# Patient Record
Sex: Male | Born: 1955 | Race: White | Hispanic: No | Marital: Single | Smoking: Former smoker
Health system: Southern US, Community
[De-identification: ages and names within clinical notes are randomized; demographics above are authoritative.]

## PROBLEM LIST (undated history)

## (undated) DIAGNOSIS — M869 Osteomyelitis, unspecified: Secondary | ICD-10-CM

## (undated) DIAGNOSIS — I639 Cerebral infarction, unspecified: Secondary | ICD-10-CM

## (undated) DIAGNOSIS — E11628 Type 2 diabetes mellitus with other skin complications: Secondary | ICD-10-CM

## (undated) DIAGNOSIS — E872 Acidosis, unspecified: Secondary | ICD-10-CM

## (undated) DIAGNOSIS — F32A Depression, unspecified: Secondary | ICD-10-CM

## (undated) DIAGNOSIS — E785 Hyperlipidemia, unspecified: Secondary | ICD-10-CM

## (undated) DIAGNOSIS — E871 Hypo-osmolality and hyponatremia: Secondary | ICD-10-CM

## (undated) DIAGNOSIS — E1129 Type 2 diabetes mellitus with other diabetic kidney complication: Secondary | ICD-10-CM

## (undated) DIAGNOSIS — M109 Gout, unspecified: Secondary | ICD-10-CM

## (undated) DIAGNOSIS — I509 Heart failure, unspecified: Secondary | ICD-10-CM

## (undated) DIAGNOSIS — L089 Local infection of the skin and subcutaneous tissue, unspecified: Secondary | ICD-10-CM

## (undated) DIAGNOSIS — N1831 Chronic kidney disease, stage 3a: Secondary | ICD-10-CM

## (undated) DIAGNOSIS — E119 Type 2 diabetes mellitus without complications: Secondary | ICD-10-CM

## (undated) DIAGNOSIS — I5032 Chronic diastolic (congestive) heart failure: Secondary | ICD-10-CM

## (undated) DIAGNOSIS — I69354 Hemiplegia and hemiparesis following cerebral infarction affecting left non-dominant side: Secondary | ICD-10-CM

## (undated) DIAGNOSIS — M86172 Other acute osteomyelitis, left ankle and foot: Secondary | ICD-10-CM

## (undated) DIAGNOSIS — N179 Acute kidney failure, unspecified: Secondary | ICD-10-CM

## (undated) DIAGNOSIS — R471 Dysarthria and anarthria: Secondary | ICD-10-CM

## (undated) DIAGNOSIS — M86472 Chronic osteomyelitis with draining sinus, left ankle and foot: Secondary | ICD-10-CM

## (undated) DIAGNOSIS — L03032 Cellulitis of left toe: Secondary | ICD-10-CM

## (undated) HISTORY — PX: WISDOM TOOTH EXTRACTION: SHX21

## (undated) HISTORY — PX: COLONOSCOPY: SHX174

## (undated) HISTORY — PX: LAPAROSCOPIC GASTRIC BANDING: SHX1100

## (undated) HISTORY — DX: Local infection of the skin and subcutaneous tissue, unspecified: E11.628

---
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
## 2021-10-28 DIAGNOSIS — M1A9XX0 Chronic gout, unspecified, without tophus (tophi): Secondary | ICD-10-CM | POA: Insufficient documentation

## 2021-10-28 DIAGNOSIS — G2581 Restless legs syndrome: Secondary | ICD-10-CM | POA: Insufficient documentation

## 2021-11-10 ENCOUNTER — Other Ambulatory Visit: Payer: Self-pay

## 2021-11-10 ENCOUNTER — Encounter: Payer: Medicare Other | Attending: Internal Medicine | Admitting: Internal Medicine

## 2021-11-10 DIAGNOSIS — E11621 Type 2 diabetes mellitus with foot ulcer: Secondary | ICD-10-CM | POA: Insufficient documentation

## 2021-11-10 DIAGNOSIS — L97512 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ed: Secondary | ICD-10-CM | POA: Diagnosis not present

## 2021-11-10 DIAGNOSIS — E1140 Type 2 diabetes mellitus with diabetic neuropathy, unspecified: Secondary | ICD-10-CM

## 2021-11-10 DIAGNOSIS — E1151 Type 2 diabetes mellitus with diabetic peripheral angiopathy without gangrene: Secondary | ICD-10-CM | POA: Diagnosis not present

## 2021-11-10 DIAGNOSIS — R2689 Other abnormalities of gait and mobility: Secondary | ICD-10-CM | POA: Diagnosis not present

## 2021-11-10 DIAGNOSIS — E1142 Type 2 diabetes mellitus with diabetic polyneuropathy: Secondary | ICD-10-CM | POA: Diagnosis not present

## 2021-11-10 DIAGNOSIS — M1990 Unspecified osteoarthritis, unspecified site: Secondary | ICD-10-CM | POA: Insufficient documentation

## 2021-11-10 NOTE — Progress Notes (Signed)
[name], [name] ([id number]) Visit Report for 11/10/2021 Allergy List Details Patient Name: [name], [name] Date of Service: 11/10/2021 10:00 AM Medical Record Number: [id number] Patient Account Number: [id number] Date of Birth/Sex: 1956/02/20 (66 y.o. M) Treating RN: Donnamarie Poag Primary Care Kiyah Demartini: [name] Other Clinician: Referring Shaneil Yazdi: [name] Treating Tyr Franca/Extender: Yaakov Guthrie in Treatment: 0 Allergies Active Allergies No Known Allergies Allergy Notes Electronic Signature(s) Signed: 11/10/2021 4:15:42 PM By: Donnamarie Poag Entered By: Donnamarie Poag on 11/10/2021 10:17:20 [name] ([id number]) -------------------------------------------------------------------------------- Arrival Information Details Patient Name: [name] Date of Service: 11/10/2021 10:00 AM Medical Record Number: [id number] Patient Account Number: [id number] Date of Birth/Sex: November 17, 1955 (66 y.o. M) Treating RN: Donnamarie Poag Primary Care Janyla Biscoe: [name] Other Clinician: Referring Saxon Barich: [name] Treating Ying [place]ocks/Extender: Yaakov Guthrie in Treatment: 0 Visit Information Patient Arrived: Ambulatory Arrival Time: 10:14 Accompanied By: self Transfer Assistance: None Patient Identification Verified: Yes Secondary Verification Process Completed: Yes Patient Requires Transmission-Based Precautions: No Patient Has Alerts: Yes Patient Alerts: DIABETIC Electronic Signature(s) Signed: 11/10/2021 4:15:42 PM By: Donnamarie Poag Entered By: Donnamarie Poag on 11/10/2021 10:15:49 [name] ([id number]) -------------------------------------------------------------------------------- Clinic Level of Care Assessment Details Patient Name: [name] Date of Service: 11/10/2021 10:00 AM Medical Record Number: [id number] Patient Account Number: [id number] Date of Birth/Sex: Oct 06, 1956 (66 y.o. M) Treating RN: Donnamarie Poag Primary Care Jamell Laymon: [name] Other Clinician: Referring Alka Falwell: [name] Treating Abbegail Matuska/Extender: Yaakov Guthrie in Treatment: 0 Clinic Level of Care Assessment Items TOOL 2 Quantity Score [] - Use when only an EandM is performed on the INITIAL visit 0 ASSESSMENTS - Nursing Assessment / Reassessment [] - General Physical Exam (combine w/ comprehensive assessment (listed just below) when performed on new 0 pt. evals) [] - 0 Comprehensive Assessment (HX, ROS, Risk Assessments, Wounds Hx, etc.) ASSESSMENTS - Wound and Skin Assessment / Reassessment X - Simple Wound Assessment / Reassessment - one wound 1 5 [] - 0 Complex Wound Assessment / Reassessment - multiple wounds [] - 0 Dermatologic / Skin Assessment (not related to wound area) ASSESSMENTS - Ostomy and/or Continence Assessment and Care [] - Incontinence Assessment and Management 0 [] - 0 Ostomy Care Assessment and Management (repouching, etc.) PROCESS - Coordination of Care X - Simple Patient / Family Education for ongoing care 1 15 [] - 0 Complex (extensive) Patient / Family Education for ongoing care X- 1 10 Staff obtains Programmer, systems, Records, Test Results / Process Orders [] - 0 Staff telephones HHA, Nursing Homes / Clarify orders / etc [] - 0 Routine Transfer to another Facility (non-emergent condition) [] - 0 Routine Hospital Admission (non-emergent condition) X- 1 15 New Admissions / Biomedical engineer / Ordering NPWT, Apligraf, etc. [] - 0 Emergency Hospital Admission (emergent condition) X- 1 10 Simple Discharge Coordination [] - 0 Complex (extensive) Discharge Coordination PROCESS - Special Needs [] - Pediatric / Minor Patient Management 0 [] - 0 Isolation Patient Management [] - 0 Hearing / Language / Visual special needs [] - 0 Assessment of Community assistance (transportation, D/C planning, etc.) [] - 0 Additional assistance / Altered mentation [] - 0 Support Surface(s) Assessment (bed, cushion,  seat, etc.) INTERVENTIONS - Wound Cleansing / Measurement X - Wound Imaging (photographs - any number of wounds) 1 5 [] - 0 Wound Tracing (instead of photographs) X- 1 5 Simple Wound Measurement - one wound [] - 0 Complex Wound Measurement - multiple wounds [name], [name] ([id number]) X- 1 5 Simple Wound Cleansing - one wound [] - 0 Complex  Wound Cleansing - multiple wounds INTERVENTIONS - Wound Dressings X - Small Wound Dressing one or multiple wounds 1 10 [] - 0 Medium Wound Dressing one or multiple wounds [] - 0 Large Wound Dressing one or multiple wounds [] - 0 Application of Medications - injection INTERVENTIONS - Miscellaneous [] - External ear exam 0 [] - 0 Specimen Collection (cultures, biopsies, blood, body fluids, etc.) [] - 0 Specimen(s) / Culture(s) sent or taken to Lab for analysis [] - 0 Patient Transfer (multiple staff / Civil Service fast streamer / Similar devices) [] - 0 Simple Staple / Suture removal (25 or less) [] - 0 Complex Staple / Suture removal (26 or more) [] - 0 Hypo / Hyperglycemic Management (close monitor of Blood Glucose) X- 1 15 Ankle / Brachial Index (ABI) - do not check if billed separately Has the patient been seen at the hospital within the last three years: Yes Total Score: 95 Level Of Care: New/Established - Level 3 Electronic Signature(s) Signed: 11/10/2021 4:15:42 PM By: Donnamarie Poag Entered By: Donnamarie Poag on 11/10/2021 10:54:51 [name] ([id number]) -------------------------------------------------------------------------------- Encounter Discharge Information Details Patient Name: [name] Date of Service: 11/10/2021 10:00 AM Medical Record Number: [id number] Patient Account Number: [id number] Date of Birth/Sex: [birth year] (66 y.o. M) Treating RN: Donnamarie Poag Primary Care Maura Braaten: [name] Other Clinician: Referring Eivan Gallina: [name] Treating Girolamo Lortie/Extender: Yaakov Guthrie in Treatment: 0 Encounter  Discharge Information Items Post Procedure Vitals Discharge Condition: Stable Temperature (F): 97.9 Ambulatory Status: Ambulatory Pulse (bpm): 102 Discharge Destination: Home Respiratory Rate (breaths/min): 16 Transportation: Private Auto Blood Pressure (mmHg): 114/74 Accompanied By: self Schedule Follow-up Appointment: No Clinical Summary of Care: Electronic Signature(s) Signed: 11/10/2021 4:15:42 PM By: Donnamarie Poag Entered By: Donnamarie Poag on 11/10/2021 11:12:56 [name] ([id number]) -------------------------------------------------------------------------------- Lower Extremity Assessment Details Patient Name: [name] Date of Service: 11/10/2021 10:00 AM Medical Record Number: [id number] Patient Account Number: [id number] Date of Birth/Sex: 15-Dec-1955 (65 y.o. M) Treating RN: Donnamarie Poag Primary Care Carie Kapuscinski: [name] Other Clinician: Referring Andras Grunewald: [name] Treating Linley Moskal/Extender: Yaakov Guthrie in Treatment: 0 Edema Assessment Assessed: [Left: No] [Right: Yes] Edema: [Left: N] [Right: o] Calf Left: Right: Point of Measurement: 36 cm From Medial Instep 38 cm Ankle Left: Right: Point of Measurement: 10 cm From Medial Instep 24 cm Knee To Floor Left: Right: From Medial Instep 47 cm Vascular Assessment Pulses: Dorsalis Pedis Palpable: [Right:Yes] Blood Pressure: Brachial: [Right:88] Ankle: [Right:Dorsalis Pedis: 84 0.95] Electronic Signature(s) Signed: 11/10/2021 4:15:42 PM By: Donnamarie Poag Entered By: Donnamarie Poag on 11/10/2021 10:37:54 [name] ([id number]) -------------------------------------------------------------------------------- Multi Wound Chart Details Patient Name: [name] Date of Service: 11/10/2021 10:00 AM Medical Record Number: [id number] Patient Account Number: [id number] Date of Birth/Sex: February 10, 1956 (65 y.o. M) Treating RN: Donnamarie Poag Primary Care Chaka Jefferys: [name] Other  Clinician: Referring Gailyn Crook: [name] Treating Jvon Meroney/Extender: Yaakov Guthrie in Treatment: 0 Vital Signs Height(in): 74 Pulse(bpm): 102 Weight(lbs): 226 Blood Pressure(mmHg): 114/74 Body Mass Index(BMI): 29 Temperature(F): 97.9 Respiratory Rate(breaths/min): 16 Photos: [N/A:N/A] Wound Location: Right, Plantar Foot N/A N/A Wounding Event: Blister N/A N/A Primary Etiology: Diabetic Wound/Ulcer of the Lower N/A N/A Extremity Comorbid History: Type II Diabetes, Osteoarthritis N/A N/A Date Acquired: 07/15/2020 N/A N/A Weeks of Treatment: 0 N/A N/A Wound Status: Open N/A N/A Measurements L x W x D (cm) 1.3x1.1x0.6 N/A N/A Area (cm) : 1.123 N/A N/A Volume (cm) : 0.674 N/A N/A Classification: Grade 2 N/A N/A Exudate Amount: Medium N/A N/A Exudate Type: Serosanguineous N/A N/A Exudate Color:  red, brown N/A N/A Wound Margin: Thickened N/A N/A Granulation Amount: Large (67-100%) N/A N/A Granulation Quality: Red, Pink N/A N/A Necrotic Amount: Small (1-33%) N/A N/A Exposed Structures: Fat Layer (Subcutaneous Tissue): N/A N/A Yes Fascia: No Tendon: No Muscle: No Joint: No Bone: No Debridement: Chemical/Enzymatic/Mechanical N/A N/A Pre-procedure Verification/Time 10:45 N/A N/A Out Taken: Pain Control: Lidocaine N/A N/A Instrument: Other(saline gauze) N/A N/A Bleeding: None N/A N/A Debridement Treatment Procedure was tolerated well N/A N/A Response: Post Debridement 1.3x1.1x0.6 N/A N/A Measurements L x W x D (cm) Post Debridement Volume: 0.674 N/A N/A (cm) Procedures Performed: Debridement N/A N/A Treatment Notes [name], [name] ([id number]) Electronic Signature(s) Signed: 11/10/2021 11:16:48 AM By: Kalman Shan DO Entered By: Kalman Shan on 11/10/2021 11:08:04 [name] ([id number]) -------------------------------------------------------------------------------- Multi-Disciplinary Care Plan Details Patient Name: [name] Date of  Service: 11/10/2021 10:00 AM Medical Record Number: [id number] Patient Account Number: [id number] Date of Birth/Sex: October 08, 1956 (65 y.o. M) Treating RN: Donnamarie Poag Primary Care : [name] Other Clinician: Referring : [name] Treating /Extender: Yaakov Guthrie in Treatment: 0 Active Inactive Orientation to the Wound Care Program Nursing Diagnoses: Knowledge deficit related to the wound healing center program Goals: Patient/caregiver will verbalize understanding of the [facility] Program Date Initiated: 11/10/2021 Target Resolution Date: 11/19/2021 Goal Status: Active Interventions: Provide education on orientation to the wound center Notes: Wound/Skin Impairment Nursing Diagnoses: Impaired tissue integrity Knowledge deficit related to smoking impact on wound healing Knowledge deficit related to ulceration/compromised skin integrity Goals: Patient/caregiver will verbalize understanding of skin care regimen Date Initiated: 11/10/2021 Target Resolution Date: 11/17/2021 Goal Status: Active Ulcer/skin breakdown will have a volume reduction of 30% by week 4 Date Initiated: 11/10/2021 Target Resolution Date: 12/08/2021 Goal Status: Active Ulcer/skin breakdown will have a volume reduction of 50% by week 8 Date Initiated: 11/10/2021 Target Resolution Date: 01/05/2022 Goal Status: Active Ulcer/skin breakdown will have a volume reduction of 80% by week 12 Date Initiated: 11/10/2021 Target Resolution Date: 02/02/2022 Goal Status: Active Ulcer/skin breakdown will heal within 14 weeks Date Initiated: 11/10/2021 Target Resolution Date: 02/16/2022 Goal Status: Active Interventions: Assess patient/caregiver ability to obtain necessary supplies Assess patient/caregiver ability to perform ulcer/skin care regimen upon admission and as needed Assess ulceration(s) every visit Notes: Electronic Signature(s) Signed: 11/10/2021 4:15:42 PM By: Donnamarie Poag Entered By: Donnamarie Poag on 11/10/2021 10:41:38 [name] ([id number]) -------------------------------------------------------------------------------- Pain Assessment Details Patient Name: [name] Date of Service: 11/10/2021 10:00 AM Medical Record Number: [id number] Patient Account Number: [id number] Date of Birth/Sex: [birth year] (65 y.o. M) Treating RN: Donnamarie Poag Primary Care : [name] Other Clinician: Referring : [name] Treating /Extender: Yaakov Guthrie in Treatment: 0 Active Problems Location of Pain Severity and Description of Pain Patient Has Paino Yes Site Locations Pain Location: Pain in Ulcers Rate the pain. Current Pain Level: 5 Pain Management and Medication Current Pain Management: Electronic Signature(s) Signed: 11/10/2021 4:15:42 PM By: Donnamarie Poag Entered By: Donnamarie Poag on 11/10/2021 10:15:57 [name] ([id number]) -------------------------------------------------------------------------------- Patient/Caregiver Education Details Patient Name: [name] Date of Service: 11/10/2021 10:00 AM Medical Record Number: [id number] Patient Account Number: [id number] Date of Birth/Gender: 1956/02/08 (65 y.o. M) Treating RN: Donnamarie Poag Primary Care Physician: [name] Other Clinician: Referring Physician: [name] Treating Physician/Extender: Yaakov Guthrie in Treatment: 0 Education Assessment Education Provided To: Patient Education Topics Provided Basic Hygiene: Elevated Blood Sugar/ Impact on Healing: Nutrition: Offloading: Pressure: Welcome To The [place]: Wound Debridement: Wound/Skin Impairment: Electronic Signature(s) Signed: 11/10/2021 4:15:42 PM By: Lyndel Safe,  Joy Entered ByDonnamarie Poag on 11/10/2021 10:55:29 [name] ([id number]) -------------------------------------------------------------------------------- Wound Assessment Details Patient  Name: [name], [name] Date of Service: 11/10/2021 10:00 AM Medical Record Number: [id number] Patient Account Number: [id number] Date of Birth/Sex: 14-Jun-1956 (65 y.o. M) Treating RN: Donnamarie Poag Primary Care : [name] Other Clinician: Referring : [name] Treating /Extender: Yaakov Guthrie in Treatment: 0 Wound Status Wound Number: 1 Primary Etiology: Diabetic Wound/Ulcer of the Lower Extremity Wound Location: Right, Plantar Foot Wound Status: Open Wounding Event: Blister Comorbid History: Type II Diabetes, Osteoarthritis Date Acquired: 07/15/2020 Weeks Of Treatment: 0 Clustered Wound: No Photos Wound Measurements Length: (cm) 1.3 Width: (cm) 1.1 Depth: (cm) 0.6 Area: (cm) 1.123 Volume: (cm) 0.674 % Reduction in Area: % Reduction in Volume: Tunneling: No Undermining: No Wound Description Classification: Grade 2 Wound Margin: Thickened Exudate Amount: Medium Exudate Type: Serosanguineous Exudate Color: red, brown Foul Odor After Cleansing: No Slough/Fibrino Yes Wound Bed Granulation Amount: Large (67-100%) Exposed Structure Granulation Quality: Red, Pink Fascia Exposed: No Necrotic Amount: Small (1-33%) Fat Layer (Subcutaneous Tissue) Exposed: Yes Necrotic Quality: Adherent Slough Tendon Exposed: No Muscle Exposed: No Joint Exposed: No Bone Exposed: No Treatment Notes Wound #1 (Foot) Wound Laterality: Plantar, Right Cleanser [place] Ancillary Kit - 15 Day Supply Discharge Instruction: Use supplies as instructed; Kit contains: (15) Saline Bullets; (15) 3x3 Gauze; 15 pr Gloves Soap and Water [name], [name] ([id number]) Discharge Instruction: Gently cleanse wound with antibacterial soap, rinse and pat dry prior to dressing wounds Wound Cleanser Discharge Instruction: Wash your hands with soap and water. Remove old dressing, discard into plastic bag and place into trash. Cleanse the wound with Wound Cleanser prior to  applying a clean dressing using gauze sponges, not tissues or cotton balls. Do not scrub or use excessive force. Pat dry using gauze sponges, not tissue or cotton balls. Peri-Wound Care Topical Gentamicin Discharge Instruction: IN THE OFFICE ONLY-Apply as directed by . Primary Dressing Hydrofera Blue Ready Transfer Foam, 2.5x2.5 (in/in) Discharge Instruction: CUT TO FIT THE WOUND BED-Apply Hydrofera Blue Ready to wound bed as directed Secondary Dressing ABD Pad 5x9 (in/in) Discharge Instruction: Cover with ABD pad/may cut in half Secured With 51M [place] Surgical Tape, 2x2 (in/yd) Compression Wrap Compression Stockings Add-Ons Electronic Signature(s) Signed: 11/10/2021 4:15:42 PM By: Donnamarie Poag Entered By: Donnamarie Poag on 11/10/2021 10:31:58 [name] ([id number]) -------------------------------------------------------------------------------- [place] Details Patient Name: [name] Date of Service: 11/10/2021 10:00 AM Medical Record Number: [id number] Patient Account Number: [id number] Date of Birth/Sex: 1955-12-25 (65 y.o. M) Treating RN: Donnamarie Poag Primary Care : [name] Other Clinician: Referring : [name] Treating /Extender: Yaakov Guthrie in Treatment: 0 Vital Signs Time Taken: 10:10 Temperature (F): 97.9 Height (in): 74 Pulse (bpm): 102 Source: Stated Respiratory Rate (breaths/min): 16 Weight (lbs): 226 Blood Pressure (mmHg): 114/74 Source: Measured Reference Range: 80 - 120 mg / dl Body Mass Index (BMI): 29 Electronic Signature(s) Signed: 11/10/2021 4:15:42 PM By: Donnamarie Poag Entered ByDonnamarie Poag on 11/10/2021 10:17:06

## 2021-11-10 NOTE — Progress Notes (Signed)
[name], [name] ([id number]) Visit Report for 11/10/2021 Chief Complaint Document Details Patient Name: [name], [name] Date of Service: 11/10/2021 10:00 AM Medical Record Number: [id number] Patient Account Number: [id number] Date of Birth/Sex: 08/17/1956 (66 y.o. M) Treating RN: Donnamarie Poag Primary Care Provider: [name] Other Clinician: Referring Provider: [name] Treating Provider/Extender: Yaakov Guthrie in Treatment: 0 Information Obtained from: Patient Chief Complaint Right plantar foot wound Electronic Signature(s) Signed: 11/10/2021 11:16:48 AM By: Kalman Shan DO Entered By: Kalman Shan on 11/10/2021 11:08:32 [name] ([id number]) -------------------------------------------------------------------------------- Debridement Details Patient Name: [name] Date of Service: 11/10/2021 10:00 AM Medical Record Number: [id number] Patient Account Number: [id number] Date of Birth/Sex: [birth year] (66 y.o. M) Treating RN: Donnamarie Poag Primary Care Provider: [name] Other Clinician: Referring Provider: [name] Treating Provider/Extender: Yaakov Guthrie in Treatment: 0 Debridement Performed for Wound #1 Right,Plantar Foot Assessment: Performed By: Physician Kalman Shan, MD Debridement Type: Chemical/Enzymatic/Mechanical Agent Used: Gauze and saline Severity of Tissue Pre Debridement: Fat layer exposed Level of Consciousness (Pre- Awake and Alert procedure): Pre-procedure Verification/Time Out Yes - 10:45 Taken: Start Time: 10:46 Pain Control: Lidocaine Instrument: Other : saline gauze Bleeding: None Response to Treatment: Procedure was tolerated well Level of Consciousness (Post- Awake and Alert procedure): Post Debridement Measurements of Total Wound Length: (cm) 1.3 Width: (cm) 1.1 Depth: (cm) 0.6 Volume: (cm) 0.674 Character of Wound/Ulcer Post Debridement: Improved Severity of Tissue Post Debridement:  Fat layer exposed Post Procedure Diagnosis Same as Pre-procedure Electronic Signature(s) Signed: 11/10/2021 11:16:48 AM By: Kalman Shan DO Signed: 11/10/2021 4:15:42 PM By: Donnamarie Poag Entered By: Donnamarie Poag on 11/10/2021 10:49:40 [name] ([id number]) -------------------------------------------------------------------------------- HPI Details Patient Name: [name] Date of Service: 11/10/2021 10:00 AM Medical Record Number: [id number] Patient Account Number: [id number] Date of Birth/Sex: 1956-10-17 (66 y.o. M) Treating RN: Donnamarie Poag Primary Care Provider: [name] Other Clinician: Referring Provider: [name] Treating Provider/Extender: Yaakov Guthrie in Treatment: 0 History of Present Illness HPI Description: Admission 11/10/2021 [name] is a 66 year old male with a past medical history of uncontrolled type 2 diabetes with last hemoglobin Y3F of 8.1 complicated by peripheral neuropathy that presents to the clinic for a 2-year history of nonhealing ulcer to the bottom of his right foot. He states this started out as a blister caused by a work boot. He reports receiving wound care when he resided in [place]. He is reestablishing his wound care in [facility] today. He is currently keeping the area clean and covered. He has insoles designed to help offload the wound bed. He currently denies signs of infection. Electronic Signature(s) Signed: 11/10/2021 11:16:48 AM By: Kalman Shan DO Entered By: Kalman Shan on 11/10/2021 11:15:48 [name] ([id number]) -------------------------------------------------------------------------------- Physical Exam Details Patient Name: [name] Date of Service: 11/10/2021 10:00 AM Medical Record Number: [id number] Patient Account Number: [id number] Date of Birth/Sex: 12-01-1955 (66 y.o. M) Treating RN: Donnamarie Poag Primary Care Provider: [name] Other  Clinician: Referring Provider: [name] Treating Provider/Extender: Yaakov Guthrie in Treatment: 0 Constitutional . Cardiovascular . Psychiatric . Notes Right foot: To the plantar aspect he has a punched out ulcer with increased depth in the center that does not probe to bone. Most of the tissues granulation tissue although does not appear healthy. No drainage noted. No signs of infection. Electronic Signature(s) Signed: 11/10/2021 11:16:48 AM By: Kalman Shan DO Entered By: Kalman Shan on 11/10/2021 11:15:56 [name] ([id number]) -------------------------------------------------------------------------------- Physician Orders Details Patient Name: [name] Date of Service: 11/10/2021 10:00 AM Medical  Record Number: [id number] Patient Account Number: [id number] Date of Birth/Sex: [birth year] (66 y.o. M) Treating RN: Donnamarie Poag Primary Care Provider: [name] Other Clinician: Referring Provider: [name] Treating Provider/Extender: Yaakov Guthrie in Treatment: 0 Verbal / Phone Orders: No Diagnosis Coding ICD-10 Coding Code Description L97.512 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ed E11.621 Type 2 diabetes mellitus with foot ulcer E11.40 Type 2 diabetes mellitus with diabetic neuropathy, unspecified R26.89 Other abnormalities of gait and mobility Follow-up Appointments o Return Appointment in 1 week. Bathing/ Shower/ Hygiene o May shower; gently cleanse wound with antibacterial soap, rinse and pat dry prior to dressing wounds - change dressing after shower or keep dressing dry; keep covered o No tub bath. Anesthetic (Use 'Patient Medications' Section for Anesthetic Order Entry) o Lidocaine applied to wound bed Edema Control - Lymphedema / Segmental Compressive Device / Other o Elevate legs to the level of the heart and pump ankles as often as possible o Elevate leg(s)  parallel to the floor when sitting. o DO YOUR BEST to sleep in the bed at night. DO NOT sleep in your recliner. Long hours of sitting in a recliner leads to swelling of the legs and/or potential wounds on your backside. Off-Loading o Foot Defender o - Right foot-MAKE SURE A SOCK OR STOCKINETTE COVERS YOUR SKIN AND NOT IN CONTACT WITH THE BOOT Additional Orders / Instructions o Follow Nutritious Diet and Increase Protein Intake Wound Treatment Wound #1 - Foot Wound Laterality: Plantar, Right Cleanser: Byram Ancillary Kit - 15 Day Supply (DME) (Generic) 1 x Per Day/15 Days Discharge Instructions: Use supplies as instructed; Kit contains: (15) Saline Bullets; (15) 3x3 Gauze; 15 pr Gloves Cleanser: Soap and Water 1 x Per Day/15 Days Discharge Instructions: Gently cleanse wound with antibacterial soap, rinse and pat dry prior to dressing wounds Cleanser: Wound Cleanser 1 x Per Day/15 Days Discharge Instructions: Wash your hands with soap and water. Remove old dressing, discard into plastic bag and place into trash. Cleanse the wound with Wound Cleanser prior to applying a clean dressing using gauze sponges, not tissues or cotton balls. Do not scrub or use excessive force. Pat dry using gauze sponges, not tissue or cotton balls. Topical: Gentamicin 1 x Per Day/15 Days Discharge Instructions: IN THE OFFICE ONLY-Apply as directed by provider. Primary Dressing: Hydrofera Blue Ready Transfer Foam, 2.5x2.5 (in/in) (Generic) 1 x Per Day/15 Days Discharge Instructions: CUT TO FIT THE WOUND BED-Apply Hydrofera Blue Ready to wound bed as directed Secondary Dressing: ABD Pad 5x9 (in/in) (Generic) 1 x Per Day/15 Days Discharge Instructions: Cover with ABD pad/may cut in half Secured With: 61M [place] Surgical Tape, 2x2 (in/yd) (Generic) 1 x Per Day/15 Days Demars, Wayman ([id number]) Electronic Signature(s) Signed: 11/10/2021 11:16:48 AM By: Kalman Shan DO Signed: 11/10/2021 4:15:42 PM  By: Donnamarie Poag Entered By: Donnamarie Poag on 11/10/2021 11:04:49 [name] ([id number]) -------------------------------------------------------------------------------- Problem List Details Patient Name: [name] Date of Service: 11/10/2021 10:00 AM Medical Record Number: [id number] Patient Account Number: [id number] Date of Birth/Sex: October 07, 1956 (66 y.o. M) Treating RN: Donnamarie Poag Primary Care Provider: [name] Other Clinician: Referring Provider: [name] Treating Provider/Extender: Yaakov Guthrie in Treatment: 0 Active Problems ICD-10 Encounter Code Description Active Date MDM Diagnosis L97.512 Non-pressure chronic ulcer of other part of right foot with fat layer 11/10/2021 No Yes exposed E11.621 Type 2 diabetes mellitus with foot ulcer 11/10/2021 No Yes E11.40 Type 2 diabetes mellitus with diabetic neuropathy, unspecified 11/10/2021  No Yes R26.89 Other abnormalities of gait and mobility 11/10/2021 No Yes Inactive Problems Resolved Problems Electronic Signature(s) Signed: 11/10/2021 11:16:48 AM By: Kalman Shan DO Entered By: Kalman Shan on 11/10/2021 11:07:56 [name] ([id number]) -------------------------------------------------------------------------------- Progress Note Details Patient Name: [name] Date of Service: 11/10/2021 10:00 AM Medical Record Number: [id number] Patient Account Number: [id number] Date of Birth/Sex: 05/01/1956 (66 y.o. M) Treating RN: Donnamarie Poag Primary Care Provider: [name] Other Clinician: Referring Provider: [name] Treating Provider/Extender: Yaakov Guthrie in Treatment: 0 Subjective Chief Complaint Information obtained from Patient Right plantar foot wound History of Present Illness (HPI) Admission 11/10/2021 Mr. Gustavo Dispenza is a 66 year old male with a past medical history of uncontrolled type 2 diabetes with last hemoglobin Z3A of 8.1 complicated by peripheral  neuropathy that presents to the clinic for a 2-year history of nonhealing ulcer to the bottom of his right foot. He states this started out as a blister caused by a work boot. He reports receiving wound care when he resided in [place]. He is reestablishing his wound care in [facility] today. He is currently keeping the area clean and covered. He has insoles designed to help offload the wound bed. He currently denies signs of infection. Patient History Information obtained from Patient. Allergies No Known Allergies Social History Former smoker - ended on 10/07/1997, Marital Status - Divorced, Alcohol Use - Never - stopped 4 yrs ago, Drug Use - No History, Caffeine Use - Daily. Medical History Endocrine Patient has history of Type II Diabetes Musculoskeletal Patient has history of Osteoarthritis Patient is treated with Controlled Diet, Oral Agents. Blood sugar is not tested. Review of Systems (ROS) Constitutional Symptoms (General Health) Denies complaints or symptoms of Fatigue, Fever, Chills, Marked Weight Change. Eyes Denies complaints or symptoms of Dry Eyes, Vision Changes, Glasses / Contacts. Ear/Nose/Mouth/Throat Denies complaints or symptoms of Difficult clearing ears, Sinusitis. Hematologic/Lymphatic Denies complaints or symptoms of Bleeding / Clotting Disorders, Human Immunodeficiency Virus. Respiratory Denies complaints or symptoms of Chronic or frequent coughs, Shortness of Breath. Cardiovascular Denies complaints or symptoms of Chest pain, LE edema. Gastrointestinal Denies complaints or symptoms of Frequent diarrhea, Nausea, Vomiting. Genitourinary stated taking Lisinopril for kidney protection withDM Immunological Denies complaints or symptoms of Hives, Itching. Integumentary (Skin) Denies complaints or symptoms of Wounds, Bleeding or bruising tendency, Breakdown, Swelling. Neurologic Denies complaints or symptoms of Numbness/parasthesias,  Focal/Weakness. Psychiatric Denies complaints or symptoms of Anxiety, Claustrophobia. IRAN, ROWE ([id number]) Objective Constitutional Vitals Time Taken: 10:10 AM, Height: 74 in, Source: Stated, Weight: 226 lbs, Source: Measured, BMI: 29, Temperature: 97.9 F, Pulse: 102 bpm, Respiratory Rate: 16 breaths/min, Blood Pressure: 114/74 mmHg. General Notes: Right foot: To the plantar aspect he has a punched out ulcer with increased depth in the center that does not probe to bone. Most of the tissues granulation tissue although does not appear healthy. No drainage noted. No signs of infection. Integumentary (Hair, Skin) Wound #1 status is Open. Original cause of wound was Blister. The date acquired was: 07/15/2020. The wound is located on the [place]. The wound measures 1.3cm length x 1.1cm width x 0.6cm depth; 1.123cm^2 area and 0.674cm^3 volume. There is Fat Layer (Subcutaneous Tissue) exposed. There is no tunneling or undermining noted. There is a medium amount of serosanguineous drainage noted. The wound margin is thickened. There is large (67-100%) red, pink granulation within the wound bed. There is a small (1-33%) amount of necrotic tissue within the wound bed including Adherent Slough. Assessment Active Problems ICD-10 Non-pressure chronic  ulcer of other part of right foot with fat layer exposed Type 2 diabetes mellitus with foot ulcer Type 2 diabetes mellitus with diabetic neuropathy, unspecified Other abnormalities of gait and mobility Patient presents with a 2-year history of nonhealing wound to the plantar aspect of the right foot. We discussed the importance of aggressive offloading, glycemic control and proper dressing in wound healing. I recommended the defender boot to help with offloading. His hemoglobin A1c is 8.1 and he is on oral medications. I recommended he continue to work on achieving better glycemic control. I recommended Hydrofera Blue daily for his  dressing change. He had no signs of infection on exam today. Follow-up in 1 week. 46 minutes was spent on the encounter including face-to-face, EMR review and coordination of care. Procedures Wound #1 Pre-procedure diagnosis of Wound #1 is a Diabetic Wound/Ulcer of the Lower Extremity located on the [place] .Severity of Tissue Pre Debridement is: Fat layer exposed. There was a Chemical/Enzymatic/Mechanical debridement performed by Kalman Shan, MD. With the following instrument(s): saline gauze after achieving pain control using Lidocaine. Other agent used was Gauze and saline. A time out was conducted at 10:45, prior to the start of the procedure. There was no bleeding. The procedure was tolerated well. Post Debridement Measurements: 1.3cm length x 1.1cm width x 0.6cm depth; 0.674cm^3 volume. Character of Wound/Ulcer Post Debridement is improved. Severity of Tissue Post Debridement is: Fat layer exposed. Post procedure Diagnosis Wound #1: Same as Pre-Procedure Plan Follow-up Appointments: Return Appointment in 1 week. Bathing/ Shower/ Hygiene: May shower; gently cleanse wound with antibacterial soap, rinse and pat dry prior to dressing wounds - change dressing after shower or keep dressing dry; keep covered No tub bath. Anesthetic (Use 'Patient Medications' Section for Anesthetic Order Entry): Lidocaine applied to wound bed Edema Control - Lymphedema / Segmental Compressive Device / Other: Watkinson, Khadar ([id number]) Elevate legs to the level of the heart and pump ankles as often as possible Elevate leg(s) parallel to the floor when sitting. DO YOUR BEST to sleep in the bed at night. DO NOT sleep in your recliner. Long hours of sitting in a recliner leads to swelling of the legs and/or potential wounds on your backside. Off-Loading: Foot Defender  - Right foot-MAKE SURE A SOCK OR STOCKINETTE COVERS YOUR SKIN AND NOT IN CONTACT WITH THE BOOT Additional Orders /  Instructions: Follow Nutritious Diet and Increase Protein Intake WOUND #1: - Foot Wound Laterality: Plantar, Right Cleanser: Byram Ancillary Kit - 15 Day Supply (DME) (Generic) 1 x Per Day/15 Days Discharge Instructions: Use supplies as instructed; Kit contains: (15) Saline Bullets; (15) 3x3 Gauze; 15 pr Gloves Cleanser: Soap and Water 1 x Per Day/15 Days Discharge Instructions: Gently cleanse wound with antibacterial soap, rinse and pat dry prior to dressing wounds Cleanser: Wound Cleanser 1 x Per Day/15 Days Discharge Instructions: Wash your hands with soap and water. Remove old dressing, discard into plastic bag and place into trash. Cleanse the wound with Wound Cleanser prior to applying a clean dressing using gauze sponges, not tissues or cotton balls. Do not scrub or use excessive force. Pat dry using gauze sponges, not tissue or cotton balls. Topical: Gentamicin 1 x Per Day/15 Days Discharge Instructions: IN THE OFFICE ONLY-Apply as directed by provider. Primary Dressing: Hydrofera Blue Ready Transfer Foam, 2.5x2.5 (in/in) (Generic) 1 x Per Day/15 Days Discharge Instructions: CUT TO FIT THE WOUND BED-Apply Hydrofera Blue Ready to wound bed as directed Secondary Dressing: ABD Pad 5x9 (in/in) (Generic) 1 x Per  Day/15 Days Discharge Instructions: Cover with ABD pad/may cut in half Secured With: [place] Surgical Tape, 2x2 (in/yd) (Generic) 1 x Per Day/15 Days 1. Hydrofera Blue 2. Aggressive offloading order defender boot 3. Follow-up in 1 week Electronic Signature(s) Signed: 11/10/2021 11:16:48 AM By: Kalman Shan DO Entered By: Kalman Shan on 11/10/2021 11:16:13 [name] ([id number]) -------------------------------------------------------------------------------- ROS/PFSH Details Patient Name: [name] Date of Service: 11/10/2021 10:00 AM Medical Record Number: [id number] Patient Account Number: [id number] Date of Birth/Sex: 10/26/1956 (65 y.o.  M) Treating RN: Donnamarie Poag Primary Care Provider: [name] Other Clinician: Referring Provider: [name] Treating Provider/Extender: Yaakov Guthrie in Treatment: 0 Information Obtained From Patient Constitutional Symptoms (General Health) Complaints and Symptoms: Negative for: Fatigue; Fever; Chills; Marked Weight Change Eyes Complaints and Symptoms: Negative for: Dry Eyes; Vision Changes; Glasses / Contacts Ear/Nose/Mouth/Throat Complaints and Symptoms: Negative for: Difficult clearing ears; Sinusitis Hematologic/Lymphatic Complaints and Symptoms: Negative for: Bleeding / Clotting Disorders; Human Immunodeficiency Virus Respiratory Complaints and Symptoms: Negative for: Chronic or frequent coughs; Shortness of Breath Cardiovascular Complaints and Symptoms: Negative for: Chest pain; LE edema Gastrointestinal Complaints and Symptoms: Negative for: Frequent diarrhea; Nausea; Vomiting Immunological Complaints and Symptoms: Negative for: Hives; Itching Integumentary (Skin) Complaints and Symptoms: Negative for: Wounds; Bleeding or bruising tendency; Breakdown; Swelling Neurologic Complaints and Symptoms: Negative for: Numbness/parasthesias; Focal/Weakness Psychiatric Complaints and Symptoms: Negative for: Anxiety; Claustrophobia [place], RUMPLE ([id number]) Endocrine Medical History: Positive for: Type II Diabetes Time with diabetes: 40 years Treated with: Oral agents, Diet Blood sugar tested every day: No Genitourinary Complaints and Symptoms: Review of System Notes: stated taking Lisinopril for kidney protection withDM Musculoskeletal Medical History: Positive for: Osteoarthritis Oncologic Immunizations Pneumococcal Vaccine: Received Pneumococcal Vaccination: Yes Received Pneumococcal Vaccination On or After 60th Birthday: Yes Implantable Devices None Family and Social History Former smoker - ended on 10/07/1997; Marital Status -  Divorced; Alcohol Use: Never - stopped 4 yrs ago; Drug Use: No History; Caffeine Use: Daily Electronic Signature(s) Signed: 11/10/2021 11:16:48 AM By: Kalman Shan DO Signed: 11/10/2021 4:15:42 PM By: Donnamarie Poag Entered By: Donnamarie Poag on 11/10/2021 10:21:17 [name] ([id number]) -------------------------------------------------------------------------------- SuperBill Details Patient Name: [name] Date of Service: 11/10/2021 Medical Record Number: [id number] Patient Account Number: [id number] Date of Birth/Sex: July 17, 1956 (66 y.o. M) Treating RN: Donnamarie Poag Primary Care Provider: [name] Other Clinician: Referring Provider: [name] Treating Provider/Extender: Yaakov Guthrie in Treatment: 0 Diagnosis Coding ICD-10 Codes Code Description [phone] 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ed E11.621 Type 2 diabetes mellitus with foot ulcer E11.40 Type 2 diabetes mellitus with diabetic neuropathy, unspecified R26.89 Other abnormalities of gait and mobility Facility Procedures CPT4 Code: [id number] Description: WOUND CARE VISIT-LEV 3 NEW PT Modifier: Quantity: 1 Physician Procedures CPT4 Code: [id number] Description: 10211 - WC PHYS LEVEL 4 - NEW PT Modifier: Quantity: 1 CPT4 Code: Description: ICD-10 Diagnosis Description L97.512 Non-pressure chronic ulcer of other part of right foot with fat layer e E11.621 Type 2 diabetes mellitus with foot ulcer E11.40 Type 2 diabetes mellitus with diabetic neuropathy, unspecified R26.89 Other  abnormalities of gait and mobility Modifier: xposed Quantity: Electronic Signature(s) Signed: 11/10/2021 11:16:48 AM By: Kalman Shan DO Entered By: Kalman Shan on 11/10/2021 11:16:19

## 2021-11-10 NOTE — Progress Notes (Signed)
[name], [name] ([id number]) Visit Report for 11/10/2021 Abuse/Suicide Risk Screen Details Patient Name: [name], [name] Date of Service: 11/10/2021 10:00 AM Medical Record Number: [id number] Patient Account Number: [id number] Date of Birth/Sex: [birth year] (65 y.o. M) Treating RN: Hansel Feinstein Primary Care Nnamdi Dacus: Manson Allan Other Clinician: Referring Kasara Schomer: Manson Allan Treating Shanica Castellanos/Extender: Tilda Franco in Treatment: 0 Abuse/Suicide Risk Screen Items Answer ABUSE RISK SCREEN: Has anyone close to you tried to hurt or harm you recentlyo No Do you feel uncomfortable with anyone in your familyo No Has anyone forced you do things that you didnot want to doo No Electronic Signature(s) Signed: 11/10/2021 4:15:42 PM By: Hansel Feinstein Entered By: Hansel Feinstein on 11/10/2021 10:21:27 [name] ([id number]) -------------------------------------------------------------------------------- Activities of Daily Living Details Patient Name: [name] Date of Service: 11/10/2021 10:00 AM Medical Record Number: [id number] Patient Account Number: [id number] Date of Birth/Sex: December 29, 1955 (65 y.o. M) Treating RN: Hansel Feinstein Primary Care Meric Joye: Manson Allan Other Clinician: Referring Liberato Stansbery: Manson Allan Treating Kellyanne Ellwanger/Extender: Tilda Franco in Treatment: 0 Activities of Daily Living Items Answer Activities of Daily Living (Please select one for each item) Drive Automobile Completely Able Take Medications Completely Able Use Telephone Completely Able Care for Appearance Completely Able Use Toilet Completely Able Bath / Shower Completely Able Dress Self Completely Able Feed Self Completely Able Walk Completely Able Get In / Out Bed Completely Able Housework Completely Able Prepare Meals Completely Able Handle Money Completely Able Shop for Self Completely Able Electronic Signature(s) Signed: 11/10/2021 4:15:42 PM By: Hansel Feinstein Entered By:  Hansel Feinstein on 11/10/2021 10:21:50 [name] ([id number]) -------------------------------------------------------------------------------- Education Screening Details Patient Name: [name] Date of Service: 11/10/2021 10:00 AM Medical Record Number: [id number] Patient Account Number: [id number] Date of Birth/Sex: [birth year] (65 y.o. M) Treating RN: Hansel Feinstein Primary Care Laterrica Libman: Manson Allan Other Clinician: Referring Leeza Heiner: Manson Allan Treating Cayne Yom/Extender: Tilda Franco in Treatment: 0 Primary Learner Assessed: Patient Learning Preferences/Education Level/Primary Language Learning Preference: Explanation Highest Education Level: College or Above Preferred Language: English Cognitive Barrier Language Barrier: No Translator Needed: No Memory Deficit: No Emotional Barrier: No Cultural/Religious Beliefs Affecting Medical Care: No Physical Barrier Impaired Vision: No Impaired Hearing: Yes Decreased Hand dexterity: No Knowledge/Comprehension Knowledge Level: High Comprehension Level: High Ability to understand written instructions: High Ability to understand verbal instructions: High Motivation Anxiety Level: Calm Cooperation: Cooperative Education Importance: Acknowledges Need Interest in Health Problems: Asks Questions Perception: Coherent Willingness to Engage in Self-Management High Activities: Readiness to Engage in Self-Management High Activities: Electronic Signature(s) Signed: 11/10/2021 4:15:42 PM By: Hansel Feinstein Entered By: Hansel Feinstein on 11/10/2021 10:22:17 [name] ([id number]) -------------------------------------------------------------------------------- Fall Risk Assessment Details Patient Name: [name] Date of Service: 11/10/2021 10:00 AM Medical Record Number: [id number] Patient Account Number: [id number] Date of Birth/Sex: January 12, 1956 (65 y.o. M) Treating RN: Hansel Feinstein Primary Care Adis Sturgill: Manson Allan Other Clinician: Referring Thelma Lorenzetti: Manson Allan Treating Lorilynn Lehr/Extender: Tilda Franco in Treatment: 0 Fall Risk Assessment Items Have you had 2 or more falls in the last 12 monthso 0 No Have you had any fall that resulted in injury in the last 12 monthso 0 No FALLS RISK SCREEN History of falling - immediate or within 3 months 0 No Secondary diagnosis (Do you have 2 or more medical diagnoseso) 0 No Ambulatory aid None/bed rest/wheelchair/nurse 0 Yes Crutches/cane/walker 0 No Furniture 0 No Intravenous therapy Access/Saline/Heparin Lock 0 No Gait/Transferring Normal/ bed rest/ wheelchair 0 Yes Weak (short steps with or without shuffle, stooped but able to lift head while walking,  may 0 No seek support from furniture) Impaired (short steps with shuffle, may have difficulty arising from chair, head down, impaired 0 No balance) Mental Status Oriented to own ability 0 Yes Electronic Signature(s) Signed: 11/10/2021 4:15:42 PM By: Hansel Feinstein Entered By: Hansel Feinstein on 11/10/2021 10:22:35 [name] ([id number]) -------------------------------------------------------------------------------- Foot Assessment Details Patient Name: [name] Date of Service: 11/10/2021 10:00 AM Medical Record Number: [id number] Patient Account Number: [id number] Date of Birth/Sex: 12-14-1955 (65 y.o. M) Treating RN: Hansel Feinstein Primary Care Laloni Rowton: Manson Allan Other Clinician: Referring Jinx Gilden: Manson Allan Treating Humbert Morozov/Extender: Tilda Franco in Treatment: 0 Foot Assessment Items Site Locations + = Sensation present, - = Sensation absent, C = Callus, U = Ulcer R = Redness, W = Warmth, M = Maceration, PU = Pre-ulcerative lesion F = Fissure, S = Swelling, D = Dryness Assessment Right: Left: Other Deformity: No No Prior Foot Ulcer: No No Prior Amputation: No No Charcot Joint: No No Ambulatory Status: Ambulatory Without Help Gait:  Steady Electronic Signature(s) Signed: 11/10/2021 4:15:42 PM By: Hansel Feinstein Entered By: Hansel Feinstein on 11/10/2021 10:25:41 [name] ([id number]) -------------------------------------------------------------------------------- Nutrition Risk Screening Details Patient Name: [name] Date of Service: 11/10/2021 10:00 AM Medical Record Number: [id number] Patient Account Number: [id number] Date of Birth/Sex: 1956-03-20 (65 y.o. M) Treating RN: Hansel Feinstein Primary Care Josetta Wigal: Manson Allan Other Clinician: Referring Dailon Sheeran: Manson Allan Treating Briona Korpela/Extender: Tilda Franco in Treatment: 0 Height (in): 74 Weight (lbs): 226 Body Mass Index (BMI): 29 Nutrition Risk Screening Items Score Screening NUTRITION RISK SCREEN: I have an illness or condition that made me change the kind and/or amount of food I eat 0 No I eat fewer than two meals per day 0 No I eat few fruits and vegetables, or milk products 0 No I have three or more drinks of beer, liquor or wine almost every day 0 No I have tooth or mouth problems that make it hard for me to eat 0 No I don't always have enough money to buy the food I need 0 No I eat alone most of the time 1 Yes I take three or more different prescribed or over-the-counter drugs a day 0 No Without wanting to, I have lost or gained 10 pounds in the last six months 0 No I am not always physically able to shop, cook and/or feed myself 0 No Nutrition Protocols Good Risk Protocol 0 No interventions needed Moderate Risk Protocol High Risk Proctocol Risk Level: Good Risk Score: 1 Electronic Signature(s) Signed: 11/10/2021 4:15:42 PM By: Hansel Feinstein Entered ByHansel Feinstein on 11/10/2021 10:22:46

## 2021-11-17 ENCOUNTER — Encounter (HOSPITAL_BASED_OUTPATIENT_CLINIC_OR_DEPARTMENT_OTHER): Payer: Medicare Other | Admitting: Internal Medicine

## 2021-11-17 ENCOUNTER — Other Ambulatory Visit: Payer: Self-pay

## 2021-11-17 DIAGNOSIS — R2689 Other abnormalities of gait and mobility: Secondary | ICD-10-CM

## 2021-11-17 DIAGNOSIS — E1140 Type 2 diabetes mellitus with diabetic neuropathy, unspecified: Secondary | ICD-10-CM

## 2021-11-17 DIAGNOSIS — L97512 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ed: Secondary | ICD-10-CM

## 2021-11-17 DIAGNOSIS — E11621 Type 2 diabetes mellitus with foot ulcer: Secondary | ICD-10-CM

## 2021-11-17 NOTE — Progress Notes (Signed)
YIDA, HYAMS ([id number]) Visit Report for 11/17/2021 Arrival Information Details Patient Name: FORNEY, KLEINPETER Date of Service: 11/17/2021 9:45 AM Medical Record Number: [id number] Patient Account Number: [id number] Date of Birth/Sex: December 05, 1955 (66 y.o. M) Treating RN: Levora Dredge Primary Care Isobella Ascher: [name] Other Clinician: Referring Massimiliano Rohleder: [name] Treating Jynesis Nakamura/Extender: Yaakov Guthrie in Treatment: 1 Visit Information History Since Last Visit Added or deleted any medications: No Patient Arrived: Ambulatory Any new allergies or adverse reactions: No Arrival Time: 09:55 Had a fall or experienced change in No Accompanied By: self activities of daily living that may affect Transfer Assistance: None risk of falls: Patient Identification Verified: Yes Hospitalized since last visit: No Secondary Verification Process Completed: Yes Has Dressing in Place as Prescribed: No Patient Requires Transmission-Based Precautions: No Pain Present Now: Yes Patient Has Alerts: Yes Patient Alerts: DIABETIC Electronic Signature(s) Signed: 11/17/2021 4:56:07 PM By: Levora Dredge Entered By: Levora Dredge on 11/17/2021 09:56:27 [name] ([id number]) -------------------------------------------------------------------------------- Clinic Level of Care Assessment Details Patient Name: [name] Date of Service: 11/17/2021 9:45 AM Medical Record Number: [id number] Patient Account Number: [id number] Date of Birth/Sex: 04/03/1956 (66 y.o. M) Treating RN: Levora Dredge Primary Care Shuntae Herzig: [name] Other Clinician: Referring Exander Shaul: [name] Treating Hoy Fallert/Extender: Yaakov Guthrie in Treatment: 1 Clinic Level of Care Assessment Items TOOL 4 Quantity Score X - Use when only an EandM is performed on FOLLOW-UP visit 1 0 ASSESSMENTS - Nursing Assessment / Reassessment _0  - Reassessment of Co-morbidities (includes updates in  patient status) 0 _1  - 0 Reassessment of Adherence to Treatment Plan ASSESSMENTS - Wound and Skin Assessment / Reassessment X - Simple Wound Assessment / Reassessment - one wound 1 5 _2  - 0 Complex Wound Assessment / Reassessment - multiple wounds _3  - 0 Dermatologic / Skin Assessment (not related to wound area) ASSESSMENTS - Focused Assessment _4  - Circumferential Edema Measurements - multi extremities 0 _5  - 0 Nutritional Assessment / Counseling / Intervention _6  - 0 Lower Extremity Assessment (monofilament, tuning fork, pulses) _7  - 0 Peripheral Arterial Disease Assessment (using hand held doppler) ASSESSMENTS - Ostomy and/or Continence Assessment and Care _8  - Incontinence Assessment and Management 0 _9  - 0 Ostomy Care Assessment and Management (repouching, etc.) PROCESS - Coordination of Care X - Simple Patient / Family Education for ongoing care 1 15 _10  - 0 Complex (extensive) Patient / Family Education for ongoing care _11  - 0 Staff obtains Programmer, systems, Records, Test Results / Process Orders _12  - 0 Staff telephones HHA, Nursing Homes / Clarify orders / etc _13  - 0 Routine Transfer to another Facility (non-emergent condition) _14  - 0 Routine Hospital Admission (non-emergent condition) _15  - 0 New Admissions / Biomedical engineer / Ordering NPWT, Apligraf, etc. _16  - 0 Emergency Hospital Admission (emergent condition) X- 1 10 Simple Discharge Coordination _17  - 0 Complex (extensive) Discharge Coordination PROCESS - Special Needs _18  - Pediatric / Minor Patient Management 0 _19  - 0 Isolation Patient Management _20  - 0 Hearing / Language / Visual special needs _21  - 0 Assessment of Community assistance (transportation, D/C planning, etc.) _22  - 0 Additional assistance / Altered mentation _23  - 0 Support Surface(s) Assessment (bed, cushion, seat, etc.) INTERVENTIONS - Wound Cleansing / Measurement Ghattas, Kolsen ([id number]) X- 1 5 Simple Wound Cleansing - one wound _24   - 0 Complex Wound Cleansing - multiple wounds X- 1 5 Wound Imaging (photographs - any number of wounds) _25  - 0 Wound Tracing (instead of photographs) X- 1 5 Simple Wound Measurement - one wound _26  -  0 Complex Wound Measurement - multiple wounds INTERVENTIONS - Wound Dressings X - Small Wound Dressing one or multiple wounds 1 10 _0  - 0 Medium Wound Dressing one or multiple wounds _1  - 0 Large Wound Dressing one or multiple wounds <ZOXWRUEAVWUJWJXB>_1<\/YNWGNFAOZHYQMVHQ>_4  - 0 Application of Medications - topical <ONGEXBMWUXLKGMWN>_0<\/UVOZDGUYQIHKVQQV>_9  - 0 Application of Medications - injection INTERVENTIONS - Miscellaneous _4  - External ear exam 0 _5  - 0 Specimen Collection (cultures, biopsies, blood, body fluids, etc.) _6  - 0 Specimen(s) / Culture(s) sent or taken to Lab for analysis _7  - 0 Patient Transfer (multiple staff / Harrel Lemon Lift / Similar devices) _8  - 0 Simple Staple / Suture removal (25 or less) _9  - 0 Complex Staple / Suture removal (26 or more) _10  - 0 Hypo / Hyperglycemic Management (close monitor of Blood Glucose) _11  - 0 Ankle / Brachial Index (ABI) - do not check if billed separately X- 1 5 Vital Signs Has the patient been seen at the hospital within the last three years: Yes Total Score: 60 Level Of Care: New/Established - Level 2 Electronic Signature(s) Signed: 11/17/2021 4:56:07 PM By: Levora Dredge Entered By: Levora Dredge on 11/17/2021 10:24:19 [name] ([id number]) -------------------------------------------------------------------------------- Encounter Discharge Information Details Patient Name: [name] Date of Service: 11/17/2021 9:45 AM Medical Record Number: [id number] Patient Account Number: [id number] Date of Birth/Sex: 02/22/1956 (66 y.o. M) Treating RN: Levora Dredge Primary Care Shalondra Wunschel: [name] Other Clinician: Referring Johnte Portnoy: [name] Treating Neeva Trew/Extender: Yaakov Guthrie in Treatment: 1 Encounter Discharge Information Items Discharge Condition:  Stable Ambulatory Status: Ambulatory Discharge Destination: Home Transportation: Private Auto Accompanied By: self Schedule Follow-up Appointment: Yes Clinical Summary of Care: Electronic Signature(s) Signed: 11/17/2021 4:56:07 PM By: Levora Dredge Entered By: Levora Dredge on 11/17/2021 10:25:50 [name] ([id number]) -------------------------------------------------------------------------------- Lower Extremity Assessment Details Patient Name: [name] Date of Service: 11/17/2021 9:45 AM Medical Record Number: [id number] Patient Account Number: [id number] Date of Birth/Sex: 1956-02-19 (66 y.o. M) Treating RN: Levora Dredge Primary Care Dorrien Grunder: [name] Other Clinician: Referring Kelvon Giannini: [name] Treating Cornelia Walraven/Extender: Yaakov Guthrie in Treatment: 1 Edema Assessment Assessed: [Left: No] [Right: No] [Left: Edema] [Right: :] Calf Left: Right: Point of Measurement: 36 cm From Medial Instep 39 cm Ankle Left: Right: Point of Measurement: 10 cm From Medial Instep 24.5 cm Vascular Assessment Pulses: Dorsalis Pedis Palpable: [Right:Yes] Posterior Tibial Palpable: [Right:Yes] Electronic Signature(s) Signed: 11/17/2021 4:56:07 PM By: Levora Dredge Entered By: Levora Dredge on 11/17/2021 10:05:22 [name] ([id number]) -------------------------------------------------------------------------------- Multi Wound Chart Details Patient Name: [name] Date of Service: 11/17/2021 9:45 AM Medical Record Number: [id number] Patient Account Number: [id number] Date of Birth/Sex: 07-23-1956 (66 y.o. M) Treating RN: Levora Dredge Primary Care Priyal Musquiz: [name] Other Clinician: Referring Sarajane Fambrough: [name] Treating Aydden Cumpian/Extender: Yaakov Guthrie in Treatment: 1 Vital Signs Height(in): 74 Pulse(bpm): 79 Weight(lbs): 226 Blood Pressure(mmHg): 126/78 Body Mass Index(BMI): 29 Temperature(F):  97.9 Respiratory Rate(breaths/min): 18 Photos: [N/A:N/A] Wound Location: Right, Plantar Foot N/A N/A Wounding Event: Blister N/A N/A Primary Etiology: Diabetic Wound/Ulcer of the Lower N/A N/A Extremity Comorbid History: Type II Diabetes, Osteoarthritis N/A N/A Date Acquired: 07/15/2020 N/A N/A Weeks of Treatment: 1 N/A N/A Wound Status: Open N/A N/A Measurements L x W x D (cm) 1.5x1.2x0.3 N/A N/A Area (cm) : 1.414 N/A N/A Volume (cm) : 0.424 N/A N/A % Reduction in Area: -25.90% N/A N/A % Reduction in Volume: 37.10% N/A N/A Classification: Grade 2 N/A N/A Exudate Amount: Medium N/A N/A Exudate Type: Serosanguineous N/A N/A Exudate Color: red, brown N/A N/A  Wound Margin: Thickened N/A N/A Granulation Amount: Large (67-100%) N/A N/A Granulation Quality: Red, Pink N/A N/A Necrotic Amount: Small (1-33%) N/A N/A Exposed Structures: Fat Layer (Subcutaneous Tissue): N/A N/A Yes Fascia: No Tendon: No Muscle: No Joint: No Bone: No Epithelialization: None N/A N/A Treatment Notes Electronic Signature(s) Signed: 11/17/2021 4:56:07 PM By: Levora Dredge Entered By: Levora Dredge on 11/17/2021 10:23:29 [name] ([id number]) -------------------------------------------------------------------------------- Kamas Details Patient Name: [name] Date of Service: 11/17/2021 9:45 AM Medical Record Number: [id number] Patient Account Number: [id number] Date of Birth/Sex: 1956/09/14 (66 y.o. M) Treating RN: Levora Dredge Primary Care Worth Kober: [name] Other Clinician: Referring Shalev Helminiak: [name] Treating Penda Venturi/Extender: Yaakov Guthrie in Treatment: 1 Active Inactive Orientation to the Wound Care Program Nursing Diagnoses: Knowledge deficit related to the wound healing center program Goals: Patient/caregiver will verbalize understanding of the [facility] Program Date Initiated: 11/10/2021 Target Resolution Date:  11/19/2021 Goal Status: Active Interventions: Provide education on orientation to the wound center Notes: Wound/Skin Impairment Nursing Diagnoses: Impaired tissue integrity Knowledge deficit related to smoking impact on wound healing Knowledge deficit related to ulceration/compromised skin integrity Goals: Patient/caregiver will verbalize understanding of skin care regimen Date Initiated: 11/10/2021 Target Resolution Date: 11/17/2021 Goal Status: Active Ulcer/skin breakdown will have a volume reduction of 30% by week 4 Date Initiated: 11/10/2021 Target Resolution Date: 12/08/2021 Goal Status: Active Ulcer/skin breakdown will have a volume reduction of 50% by week 8 Date Initiated: 11/10/2021 Target Resolution Date: 01/05/2022 Goal Status: Active Ulcer/skin breakdown will have a volume reduction of 80% by week 12 Date Initiated: 11/10/2021 Target Resolution Date: 02/02/2022 Goal Status: Active Ulcer/skin breakdown will heal within 14 weeks Date Initiated: 11/10/2021 Target Resolution Date: 02/16/2022 Goal Status: Active Interventions: Assess patient/caregiver ability to obtain necessary supplies Assess patient/caregiver ability to perform ulcer/skin care regimen upon admission and as needed Assess ulceration(s) every visit Notes: Electronic Signature(s) Signed: 11/17/2021 4:56:07 PM By: Levora Dredge Entered By: Levora Dredge on 11/17/2021 10:23:19 [name] ([id number]) -------------------------------------------------------------------------------- Pain Assessment Details Patient Name: [name] Date of Service: 11/17/2021 9:45 AM Medical Record Number: [id number] Patient Account Number: [id number] Date of Birth/Sex: [birth year] (66 y.o. M) Treating RN: Levora Dredge Primary Care Keone Kamer: [name] Other Clinician: Referring Ninfa Giannelli: [name] Treating Gennifer Potenza/Extender: Yaakov Guthrie in Treatment: 1 Active Problems Location of Pain Severity and  Description of Pain Patient Has Paino Yes Site Locations Rate the pain. Current Pain Level: 5 Pain Management and Medication Current Pain Management: Notes pt states pain all the time in right foot wound Electronic Signature(s) Signed: 11/17/2021 4:56:07 PM By: Levora Dredge Entered By: Levora Dredge on 11/17/2021 09:58:48 [name] ([id number]) -------------------------------------------------------------------------------- Patient/Caregiver Education Details Patient Name: [name] Date of Service: 11/17/2021 9:45 AM Medical Record Number: [id number] Patient Account Number: [id number] Date of Birth/Gender: 04-01-1956 (66 y.o. M) Treating RN: Levora Dredge Primary Care Physician: [name] Other Clinician: Referring Physician: [name] Treating Physician/Extender: Yaakov Guthrie in Treatment: 1 Education Assessment Education Provided To: Patient Education Topics Provided Wound/Skin Impairment: Handouts: Caring for Your Ulcer Methods: Explain/Verbal Responses: State content correctly Electronic Signature(s) Signed: 11/17/2021 4:56:07 PM By: Levora Dredge Entered By: Levora Dredge on 11/17/2021 10:24:43 [name] ([id number]) -------------------------------------------------------------------------------- Wound Assessment Details Patient Name: [name] Date of Service: 11/17/2021 9:45 AM Medical Record Number: [id number] Patient Account Number: [id number] Date of Birth/Sex: [birth year] (66 y.o. M) Treating RN: Levora Dredge Primary Care Kyng Matlock: [name] Other Clinician: Referring Finis Hendricksen: [name] Treating Yuritzi Kamp/Extender: Yaakov Guthrie in Treatment: 1 Wound  Status Wound Number: 1 Primary Etiology: Diabetic Wound/Ulcer of the Lower Extremity Wound Location: Right, Plantar Foot Wound Status: Open Wounding Event: Blister Comorbid History: Type II Diabetes, Osteoarthritis Date Acquired:  07/15/2020 Weeks Of Treatment: 1 Clustered Wound: No Photos Wound Measurements Length: (cm) 1.5 Width: (cm) 1.2 Depth: (cm) 0.3 Area: (cm) 1.414 Volume: (cm) 0.424 % Reduction in Area: -25.9% % Reduction in Volume: 37.1% Epithelialization: None Tunneling: No Undermining: No Wound Description Classification: Grade 2 Wound Margin: Thickened Exudate Amount: Medium Exudate Type: Serosanguineous Exudate Color: red, brown Foul Odor After Cleansing: No Slough/Fibrino Yes Wound Bed Granulation Amount: Large (67-100%) Exposed Structure Granulation Quality: Red, Pink Fascia Exposed: No Necrotic Amount: Small (1-33%) Fat Layer (Subcutaneous Tissue) Exposed: Yes Necrotic Quality: Adherent Slough Tendon Exposed: No Muscle Exposed: No Joint Exposed: No Bone Exposed: No Treatment Notes Wound #1 (Foot) Wound Laterality: Plantar, Right Cleanser [place]ram Ancillary Kit - 15 Day Supply Discharge Instruction: Use supplies as instructed; Kit contains: (15) Saline Bullets; (15) 3x3 Gauze; 15 pr Gloves Soap and Water [place], Damoni ([id number]) Discharge Instruction: Gently cleanse wound with antibacterial soap, rinse and pat dry prior to dressing wounds Wound Cleanser Discharge Instruction: Wash your hands with soap and water. Remove old dressing, discard into plastic bag and place into trash. Cleanse the wound with Wound Cleanser prior to applying a clean dressing using gauze sponges, not tissues or cotton balls. Do not scrub or use excessive force. Pat dry using gauze sponges, not tissue or cotton balls. Peri-Wound Care Topical Gentamicin Discharge Instruction: IN THE OFFICE ONLY-Apply as directed by Keani Gotcher. Primary Dressing Hydrofera Blue Ready Transfer Foam, 2.5x2.5 (in/in) Discharge Instruction: CUT TO FIT THE WOUND BED-Apply Hydrofera Blue Ready to wound bed as directed Secondary Dressing ABD Pad 5x9 (in/in) Discharge Instruction: Cover with ABD pad/may cut in half Secured  With 70M [employer] Surgical Tape, 2x2 (in/yd) Compression Wrap Compression Stockings Add-Ons Electronic Signature(s) Signed: 11/17/2021 4:56:07 PM By: Levora Dredge Entered By: Levora Dredge on 11/17/2021 10:03:37 [name] ([id number]) -------------------------------------------------------------------------------- Vitals Details Patient Name: [name] Date of Service: 11/17/2021 9:45 AM Medical Record Number: [id number] Patient Account Number: [id number] Date of Birth/Sex: 05/22/1956 (66 y.o. M) Treating RN: Levora Dredge Primary Care Emmersyn Kratzke: [name] Other Clinician: Referring Karma Ansley: [name] Treating Zenita Kister/Extender: Yaakov Guthrie in Treatment: 1 Vital Signs Time Taken: 09:56 Temperature (F): 97.9 Height (in): 74 Pulse (bpm): 79 Weight (lbs): 226 Respiratory Rate (breaths/min): 18 Body Mass Index (BMI): 29 Blood Pressure (mmHg): 126/78 Reference Range: 80 - 120 mg / dl Electronic Signature(s) Signed: 11/17/2021 4:56:07 PM By: Levora Dredge Entered By: Levora Dredge on 11/17/2021 09:58:16

## 2021-11-17 NOTE — Progress Notes (Signed)
[name], [name] ([id number]) Visit Report for 11/17/2021 Chief Complaint Document Details Patient Name: [name] Date of Service: 11/17/2021 9:45 AM Medical Record Number: [id number] Patient Account Number: [id number] Date of Birth/Sex: 1955-12-26 (66 y.o. M) Treating RN: Levora Dredge Primary Care Provider: Lang [name] Other Clinician: Referring Provider: Lang [name] Treating Provider/Extender: Yaakov Guthrie in Treatment: 1 Information Obtained from: Patient Chief Complaint Right plantar foot wound Electronic Signature(s) Signed: 11/17/2021 10:58:01 AM By: Kalman Shan DO Entered By: Kalman Shan on 11/17/2021 10:48:42 [name] ([id number]) -------------------------------------------------------------------------------- HPI Details Patient Name: [name] Date of Service: 11/17/2021 9:45 AM Medical Record Number: [id number] Patient Account Number: [id number] Date of Birth/Sex: [birth year] (66 y.o. M) Treating RN: Levora Dredge Primary Care Provider: Lang [name] Other Clinician: Referring Provider: Lang [name] Treating Provider/Extender: Yaakov Guthrie in Treatment: 1 History of Present Illness HPI Description: Admission 11/10/2021 [name] is a 66 year old male with a past medical history of uncontrolled type 2 diabetes with last hemoglobin J8S of 8.1 complicated by peripheral neuropathy that presents to the clinic for a 2-year history of nonhealing ulcer to the bottom of his right foot. He states this started out as a blister caused by a work boot. He reports receiving wound care when he resided in [place]. He is reestablishing his wound care in [facility] today. He is currently keeping the area clean and covered. He has insoles designed to help offload the wound bed. He currently denies signs of infection. 1/18; patient presents for follow-up. He has been using Hydrofera Blue to the wound bed. He has no  issues or complaints today. He has not received the defender.[name] He denies signs of infection. Electronic Signature(s) Signed: 11/17/2021 10:58:01 AM By: Kalman Shan DO Entered By: Kalman Shan on 11/17/2021 10:49:05 [name] ([id number]) -------------------------------------------------------------------------------- Physical Exam Details Patient Name: [name] Date of Service: 11/17/2021 9:45 AM Medical Record Number: [id number] Patient Account Number: [id number] Date of Birth/Sex: 03-12-1956 (66 y.o. M) Treating RN: Levora Dredge Primary Care Provider: Lang [name] Other Clinician: Referring Provider: Lang [name] Treating Provider/Extender: Yaakov Guthrie in Treatment: 1 Constitutional . Cardiovascular . Psychiatric . Notes Right foot: To the plantar aspect there is a punched-out ulcer with increased depth in the center. No probing to bone. Mostly granulation tissue present with some scant nonviable tissue. No drainage noted. No surrounding signs of infection. Electronic Signature(s) Signed: 11/17/2021 10:58:01 AM By: Kalman Shan DO Entered By: Kalman Shan on 11/17/2021 10:49:53 [name] ([id number]) -------------------------------------------------------------------------------- Physician Orders Details Patient Name: [name] Date of Service: 11/17/2021 9:45 AM Medical Record Number: [id number] Patient Account Number: [id number] Date of Birth/Sex: 1956-01-03 (66 y.o. M) Treating RN: Levora Dredge Primary Care Provider: Lang [name] Other Clinician: Referring Provider: Lang [name] Treating Provider/Extender: Yaakov Guthrie in Treatment: 1 Verbal / Phone Orders: No Diagnosis Coding Follow-up Appointments o Return Appointment in 1 week. Bathing/ Shower/ Hygiene o May shower; gently cleanse wound with antibacterial soap, rinse and pat dry prior to dressing wounds - change dressing after shower or keep  dressing dry; keep covered o No tub bath. Anesthetic (Use 'Patient Medications' Section for Anesthetic Order Entry) o Lidocaine applied to wound bed Edema Control - Lymphedema / Segmental Compressive Device / Other o Elevate legs to the level of the heart and pump ankles as often as possible o Elevate leg(s) parallel to the floor when sitting. o DO YOUR BEST to sleep in the bed at night. DO NOT sleep in your recliner. Long hours of sitting in a  recliner leads to swelling of the legs and/or potential wounds on your backside. Off-Loading o Foot Defender o - Right foot-MAKE SURE A SOCK OR STOCKINETTE COVERS YOUR SKIN AND NOT IN CONTACT WITH THE BOOT Additional Orders / Instructions o Follow Nutritious Diet and Increase Protein Intake Wound Treatment Wound #1 - Foot Wound Laterality: Plantar, Right Cleanser: Byram Ancillary Kit - 15 Day Supply (Generic) 1 x Per Day/15 Days Discharge Instructions: Use supplies as instructed; Kit contains: (15) Saline Bullets; (15) 3x3 Gauze; 15 pr Gloves Cleanser: Soap and Water 1 x Per Day/15 Days Discharge Instructions: Gently cleanse wound with antibacterial soap, rinse and pat dry prior to dressing wounds Cleanser: Wound Cleanser 1 x Per Day/15 Days Discharge Instructions: Wash your hands with soap and water. Remove old dressing, discard into plastic bag and place into trash. Cleanse the wound with Wound Cleanser prior to applying a clean dressing using gauze sponges, not tissues or cotton balls. Do not scrub or use excessive force. Pat dry using gauze sponges, not tissue or cotton balls. Topical: Gentamicin 1 x Per Day/15 Days Discharge Instructions: IN THE OFFICE ONLY-Apply as directed by provider. Primary Dressing: Hydrofera Blue Ready Transfer Foam, 2.5x2.5 (in/in) (Generic) 1 x Per Day/15 Days Discharge Instructions: CUT TO FIT THE WOUND BED-Apply Hydrofera Blue Ready to wound bed as directed Secondary Dressing: ABD Pad 5x9 (in/in)  (Generic) 1 x Per Day/15 Days Discharge Instructions: Cover with ABD pad/may cut in half Secured With: 53M [place] Surgical Tape, 2x2 (in/yd) (Generic) 1 x Per Day/15 Days Electronic Signature(s) Signed: 11/17/2021 10:58:01 AM By: Kalman Shan DO Entered By: Kalman Shan on 11/17/2021 10:57:36 [name] ([id number]) -------------------------------------------------------------------------------- Problem List Details Patient Name: [name] Date of Service: 11/17/2021 9:45 AM Medical Record Number: [id number] Patient Account Number: [id number] Date of Birth/Sex: Jul 22, 1956 (66 y.o. M) Treating RN: Levora Dredge Primary Care Provider: Lang [name] Other Clinician: Referring Provider: Lang [name] Treating Provider/Extender: Yaakov Guthrie in Treatment: 1 Active Problems ICD-10 Encounter Code Description Active Date MDM Diagnosis L97.512 Non-pressure chronic ulcer of other part of right foot with fat layer 11/10/2021 No Yes exposed E11.621 Type 2 diabetes mellitus with foot ulcer 11/10/2021 No Yes E11.40 Type 2 diabetes mellitus with diabetic neuropathy, unspecified 11/10/2021 No Yes R26.89 Other abnormalities of gait and mobility 11/10/2021 No Yes Inactive Problems Resolved Problems Electronic Signature(s) Signed: 11/17/2021 10:58:01 AM By: Kalman Shan DO Entered By: Kalman Shan on 11/17/2021 10:48:00 [name] ([id number]) -------------------------------------------------------------------------------- Progress Note Details Patient Name: [name] Date of Service: 11/17/2021 9:45 AM Medical Record Number: [id number] Patient Account Number: [id number] Date of Birth/Sex: Jun 26, 1956 (66 y.o. M) Treating RN: Levora Dredge Primary Care Provider: Lang [name] Other Clinician: Referring Provider: Lang [name] Treating Provider/Extender: Yaakov Guthrie in Treatment: 1 Subjective Chief Complaint Information  obtained from Patient Right plantar foot wound History of Present Illness (HPI) Admission 11/10/2021 Mr. Kaimana Lurz is a 66 year old male with a past medical history of uncontrolled type 2 diabetes with last hemoglobin G8Q of 8.1 complicated by peripheral neuropathy that presents to the clinic for a 2-year history of nonhealing ulcer to the bottom of his right foot. He states this started out as a blister caused by a work boot. He reports receiving wound care when he resided in [place]. He is reestablishing his wound care in [facility] today. He is currently keeping the area clean and covered. He has insoles designed to help offload the wound bed. He currently denies signs of infection. 1/18; patient  presents for follow-up. He has been using Hydrofera Blue to the wound bed. He has no issues or complaints today. He has not received the defender.[name] He denies signs of infection. Objective Constitutional Vitals Time Taken: 9:56 AM, Height: 74 in, Weight: 226 lbs, BMI: 29, Temperature: 97.9 F, Pulse: 79 bpm, Respiratory Rate: 18 breaths/min, Blood Pressure: 126/78 mmHg. General Notes: Right foot: To the plantar aspect there is a punched-out ulcer with increased depth in the center. No probing to bone. Mostly granulation tissue present with some scant nonviable tissue. No drainage noted. No surrounding signs of infection. Integumentary (Hair, Skin) Wound #1 status is Open. Original cause of wound was Blister. The date acquired was: 07/15/2020. The wound has been in treatment 1 weeks. The wound is located on the [place]. The wound measures 1.5cm length x 1.2cm width x 0.3cm depth; 1.414cm^2 area and 0.424cm^3 volume. There is Fat Layer (Subcutaneous Tissue) exposed. There is no tunneling or undermining noted. There is a medium amount of serosanguineous drainage noted. The wound margin is thickened. There is large (67-100%) red, pink granulation within the wound bed. There is  a small (1-33%) amount of necrotic tissue within the wound bed including Adherent Slough. Assessment Active Problems ICD-10 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ed Type 2 diabetes mellitus with foot ulcer Type 2 diabetes mellitus with diabetic neuropathy, unspecified Other abnormalities of gait and mobility Patient's wound has shown improvement in depth since last clinic visit. I recommended continuing with Hydrofera Blue and aggressively offloading the wound bed. We will follow-up with our wound care supplier about the defender boot since he has not received this yet. Follow-up in 1 week. ROCKO, FESPERMAN ([id number]) Plan Follow-up Appointments: Return Appointment in 1 week. Bathing/ Shower/ Hygiene: May shower; gently cleanse wound with antibacterial soap, rinse and pat dry prior to dressing wounds - change dressing after shower or keep dressing dry; keep covered No tub bath. Anesthetic (Use 'Patient Medications' Section for Anesthetic Order Entry): Lidocaine applied to wound bed Edema Control - Lymphedema / Segmental Compressive Device / Other: Elevate legs to the level of the heart and pump ankles as often as possible Elevate leg(s) parallel to the floor when sitting. DO YOUR BEST to sleep in the bed at night. DO NOT sleep in your recliner. Long hours of sitting in a recliner leads to swelling of the legs and/or potential wounds on your backside. Off-Loading: Foot Defender  - Right foot-MAKE SURE A SOCK OR STOCKINETTE COVERS YOUR SKIN AND NOT IN CONTACT WITH THE BOOT Additional Orders / Instructions: Follow Nutritious Diet and Increase Protein Intake WOUND #1: - Foot Wound Laterality: Plantar, Right Cleanser: Byram Ancillary Kit - 15 Day Supply (Generic) 1 x Per Day/15 Days Discharge Instructions: Use supplies as instructed; Kit contains: (15) Saline Bullets; (15) 3x3 Gauze; 15 pr Gloves Cleanser: Soap and Water 1 x Per Day/15  Days Discharge Instructions: Gently cleanse wound with antibacterial soap, rinse and pat dry prior to dressing wounds Cleanser: Wound Cleanser 1 x Per Day/15 Days Discharge Instructions: Wash your hands with soap and water. Remove old dressing, discard into plastic bag and place into trash. Cleanse the wound with Wound Cleanser prior to applying a clean dressing using gauze sponges, not tissues or cotton balls. Do not scrub or use excessive force. Pat dry using gauze sponges, not tissue or cotton balls. Topical: Gentamicin 1 x Per Day/15 Days Discharge Instructions: IN THE OFFICE ONLY-Apply as directed by provider. Primary Dressing: Hydrofera  Blue Ready Transfer Foam, 2.5x2.5 (in/in) (Generic) 1 x Per Day/15 Days Discharge Instructions: CUT TO FIT THE WOUND BED-Apply Hydrofera Blue Ready to wound bed as directed Secondary Dressing: ABD Pad 5x9 (in/in) (Generic) 1 x Per Day/15 Days Discharge Instructions: Cover with ABD pad/may cut in half Secured With: 77M Medipore H Soft Cloth Surgical Tape, 2x2 (in/yd) (Generic) 1 x Per Day/15 Days 1. Hydrofera Blue 2. Aggressive offloading 3. Follow-up in 1 week Electronic Signature(s) Signed: 11/17/2021 10:58:01 AM By: Kalman Shan DO Entered By: Kalman Shan on 11/17/2021 10:56:58 [name] ([id number]) -------------------------------------------------------------------------------- SuperBill Details Patient Name: [name] Date of Service: 11/17/2021 Medical Record Number: [id number] Patient Account Number: [id number] Date of Birth/Sex: 10/20/1956 (66 y.o. M) Treating RN: Levora Dredge Primary Care Provider: Lang [name] Other Clinician: Referring Provider: Lang [name] Treating Provider/Extender: Yaakov Guthrie in Treatment: 1 Diagnosis Coding ICD-10 Codes Code Description [phone] 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ed E11.621 Type 2 diabetes mellitus with foot ulcer E11.40  Type 2 diabetes mellitus with diabetic neuropathy, unspecified R26.89 Other abnormalities of gait and mobility Facility Procedures CPT4 Code: [id number] Description: [phone] - WOUND CARE VISIT-LEV 2 EST PT Modifier: Quantity: 1 Physician Procedures CPT4 Code: [id number] Description: 99213 - WC PHYS LEVEL 3 - EST PT Modifier: Quantity: 1 CPT4 Code: Description: ICD-10 Diagnosis Description L97.512 Non-pressure chronic ulcer of other part of right foot with fat layer e E11.621 Type 2 diabetes mellitus with foot ulcer E11.40 Type 2 diabetes mellitus with diabetic neuropathy, unspecified R26.89 Other  abnormalities of gait and mobility Modifier: xposed Quantity: Electronic Signature(s) Signed: 11/17/2021 10:58:01 AM By: Kalman Shan DO Entered By: Kalman Shan on 11/17/2021 10:57:17

## 2021-12-01 ENCOUNTER — Encounter: Payer: Medicare Other | Attending: Internal Medicine | Admitting: Internal Medicine

## 2021-12-01 ENCOUNTER — Other Ambulatory Visit: Payer: Self-pay

## 2021-12-01 DIAGNOSIS — R2689 Other abnormalities of gait and mobility: Secondary | ICD-10-CM | POA: Insufficient documentation

## 2021-12-01 DIAGNOSIS — E11621 Type 2 diabetes mellitus with foot ulcer: Secondary | ICD-10-CM | POA: Diagnosis not present

## 2021-12-01 DIAGNOSIS — L97528 Non-pressure chronic ulcer of other part of left foot with other specified severity: Secondary | ICD-10-CM | POA: Diagnosis not present

## 2021-12-01 DIAGNOSIS — E1140 Type 2 diabetes mellitus with diabetic neuropathy, unspecified: Secondary | ICD-10-CM | POA: Diagnosis not present

## 2021-12-01 DIAGNOSIS — L97512 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ed: Secondary | ICD-10-CM | POA: Diagnosis present

## 2021-12-01 DIAGNOSIS — M1990 Unspecified osteoarthritis, unspecified site: Secondary | ICD-10-CM | POA: Insufficient documentation

## 2021-12-01 NOTE — Progress Notes (Signed)
RACHID, PARHAM ([id number]) Visit Report for 12/01/2021 Arrival Information Details Patient Name: [name], [name] Date of Service: 12/01/2021 8:30 AM Medical Record Number: [id number] Patient Account Number: [id number] Date of Birth/Sex: 1956/03/09 (65 y.o. M) Treating RN: Donnamarie Poag Primary Care Georgie Eduardo: [name] Other Clinician: Referring Shanetra Blumenstock: [name] Treating Thamas Appleyard/Extender: Yaakov Guthrie in Treatment: 3 Visit Information History Since Last Visit Added or deleted any medications: No Patient Arrived: Ambulatory Had a fall or experienced change in No Arrival Time: 08:32 activities of daily living that may affect Accompanied By: self risk of falls: Transfer Assistance: None Hospitalized since last visit: No Patient Requires Transmission-Based Precautions: No Has Dressing in Place as Prescribed: Yes Patient Has Alerts: Yes Pain Present Now: No Patient Alerts: DIABETIC Electronic Signature(s) Signed: 12/01/2021 12:24:38 PM By: Donnamarie Poag Entered By: Donnamarie Poag on 12/01/2021 08:38:12 [name] ([id number]) -------------------------------------------------------------------------------- Encounter Discharge Information Details Patient Name: [name] Date of Service: 12/01/2021 8:30 AM Medical Record Number: [id number] Patient Account Number: [id number] Date of Birth/Sex: [birth year] (65 y.o. M) Treating RN: Donnamarie Poag Primary Care Nazareth Kirk: [name] Other Clinician: Referring Tinzlee Craker: [name] Treating Bama Hanselman/Extender: Yaakov Guthrie in Treatment: 3 Encounter Discharge Information Items Post Procedure Vitals Discharge Condition: Stable Temperature (F): 98.0 Ambulatory Status: Ambulatory Pulse (bpm): 89 Discharge Destination: Home Respiratory Rate (breaths/min): 16 Transportation: Private Auto Blood Pressure (mmHg): 130/78 Accompanied By: self Schedule Follow-up Appointment: Yes Clinical Summary of  Care: Electronic Signature(s) Signed: 12/01/2021 12:24:38 PM By: Donnamarie Poag Entered By: Donnamarie Poag on 12/01/2021 26:37:85 [name] ([id number]) -------------------------------------------------------------------------------- Lower Extremity Assessment Details Patient Name: [name] Date of Service: 12/01/2021 8:30 AM Medical Record Number: [id number] Patient Account Number: [id number] Date of Birth/Sex: 24-Nov-1955 (65 y.o. M) Treating RN: Donnamarie Poag Primary Care Demar Shad: [name] Other Clinician: Referring Florrie Ramires: [name] Treating Darianna Amy/Extender: Yaakov Guthrie in Treatment: 3 Edema Assessment Assessed: [Left: Yes] [Right: Yes] Edema: [Left: No] [Right: No] Vascular Assessment Pulses: Dorsalis Pedis Palpable: [Left:Yes] [Right:Yes] Electronic Signature(s) Signed: 12/01/2021 12:24:38 PM By: Donnamarie Poag Entered By: Donnamarie Poag on 12/01/2021 08:52:47 [name] ([id number]) -------------------------------------------------------------------------------- Multi Wound Chart Details Patient Name: [name] Date of Service: 12/01/2021 8:30 AM Medical Record Number: [id number] Patient Account Number: [id number] Date of Birth/Sex: 1956-02-26 (65 y.o. M) Treating RN: Donnamarie Poag Primary Care Burley Kopka: [name] Other Clinician: Referring Vianca Bracher: [name] Treating Alyza Artiaga/Extender: Yaakov Guthrie in Treatment: 3 Vital Signs Height(in): 74 Pulse(bpm): 89 Weight(lbs): 226 Blood Pressure(mmHg): 130/78 Body Mass Index(BMI): 29 Temperature(F): 98.0 Respiratory Rate(breaths/min): 16 Photos: [N/A:N/A] Wound Location: Right, Plantar Foot Left Toe Great N/A Wounding Event: Blister Gradually Appeared N/A Primary Etiology: Diabetic Wound/Ulcer of the Lower Diabetic Wound/Ulcer of the Lower N/A Extremity Extremity Comorbid History: Type II Diabetes, Osteoarthritis Type II Diabetes, Osteoarthritis N/A Date Acquired: 07/15/2020  11/25/2021 N/A Weeks of Treatment: 3 0 N/A Wound Status: Open Open N/A Wound Recurrence: No No N/A Measurements L x W x D (cm) 1.6x0.9x0.3 4x2.5x0.5 N/A Area (cm) : 1.131 7.854 N/A Volume (cm) : 0.339 3.927 N/A % Reduction in Area: -0.70% N/A N/A % Reduction in Volume: 49.70% N/A N/A Classification: Grade 2 Grade 2 N/A Exudate Amount: Medium Medium N/A Exudate Type: Serosanguineous Serosanguineous N/A Exudate Color: red, brown red, brown N/A Wound Margin: Thickened N/A N/A Granulation Amount: Large (67-100%) Small (1-33%) N/A Granulation Quality: Red, Pink Red N/A Necrotic Amount: Small (1-33%) Large (67-100%) N/A Necrotic Tissue: Adherent [place] N/A Exposed Structures: Fat Layer (Subcutaneous Tissue): Fat Layer (Subcutaneous Tissue): N/A Yes Yes Fascia:  No Fascia: No Tendon: No Tendon: No Muscle: No Muscle: No Joint: No Joint: No Bone: No Bone: No Epithelialization: None N/A N/A Treatment Notes Electronic Signature(s) Signed: 12/01/2021 12:24:38 PM By: Donnamarie Poag Entered By: Donnamarie Poag on 12/01/2021 09:04:49 [name] ([id number]) -------------------------------------------------------------------------------- Multi-Disciplinary Care Plan Details Patient Name: [name] Date of Service: 12/01/2021 8:30 AM Medical Record Number: [id number] Patient Account Number: [id number] Date of Birth/Sex: 14-Nov-1955 (65 y.o. M) Treating RN: Donnamarie Poag Primary Care Olvin Rohr: [name] Other Clinician: Referring Wei Newbrough: [name] Treating Wylan Gentzler/Extender: Yaakov Guthrie in Treatment: 3 Active Inactive Wound/Skin Impairment Nursing Diagnoses: Impaired tissue integrity Knowledge deficit related to smoking impact on wound healing Knowledge deficit related to ulceration/compromised skin integrity Goals: Patient/caregiver will verbalize understanding of skin care regimen Date Initiated: 11/10/2021 Date Inactivated:  12/01/2021 Target Resolution Date: 11/17/2021 Goal Status: Met Ulcer/skin breakdown will have a volume reduction of 30% by week 4 Date Initiated: 11/10/2021 Target Resolution Date: 12/08/2021 Goal Status: Active Ulcer/skin breakdown will have a volume reduction of 50% by week 8 Date Initiated: 11/10/2021 Target Resolution Date: 01/05/2022 Goal Status: Active Ulcer/skin breakdown will have a volume reduction of 80% by week 12 Date Initiated: 11/10/2021 Target Resolution Date: 02/02/2022 Goal Status: Active Ulcer/skin breakdown will heal within 14 weeks Date Initiated: 11/10/2021 Target Resolution Date: 02/16/2022 Goal Status: Active Interventions: Assess patient/caregiver ability to obtain necessary supplies Assess patient/caregiver ability to perform ulcer/skin care regimen upon admission and as needed Assess ulceration(s) every visit Notes: Electronic Signature(s) Signed: 12/01/2021 12:24:38 PM By: Donnamarie Poag Entered By: Donnamarie Poag on 12/01/2021 08:53:12 [name] ([id number]) -------------------------------------------------------------------------------- Pain Assessment Details Patient Name: [name] Date of Service: 12/01/2021 8:30 AM Medical Record Number: [id number] Patient Account Number: [id number] Date of Birth/Sex: [birth year] (65 y.o. M) Treating RN: Donnamarie Poag Primary Care Jarren Para: [name] Other Clinician: Referring Vernesha Talbot: [name] Treating Vincenzina Jagoda/Extender: Yaakov Guthrie in Treatment: 3 Active Problems Location of Pain Severity and Description of Pain Patient Has Paino No Site Locations Rate the pain. Current Pain Level: 0 Pain Management and Medication Current Pain Management: Electronic Signature(s) Signed: 12/01/2021 12:24:38 PM By: Donnamarie Poag Entered By: Donnamarie Poag on 12/01/2021 08:42:38 [name] ([id number]) -------------------------------------------------------------------------------- Patient/Caregiver Education  Details Patient Name: [name] Date of Service: 12/01/2021 8:30 AM Medical Record Number: [id number] Patient Account Number: [id number] Date of Birth/Gender: 1956/03/15 (65 y.o. M) Treating RN: Donnamarie Poag Primary Care Physician: [name] Other Clinician: Referring Physician: [name] Treating Physician/Extender: Yaakov Guthrie in Treatment: 3 Education Assessment Education Provided To: Patient Education Topics Provided Basic Hygiene: Wound/Skin Impairment: Electronic Signature(s) Signed: 12/01/2021 12:24:38 PM By: Donnamarie Poag Entered By: Donnamarie Poag on 12/01/2021 [place] [name] ([id number]) -------------------------------------------------------------------------------- Wound Assessment Details Patient Name: [name] Date of Service: 12/01/2021 8:30 AM Medical Record Number: [id number] Patient Account Number: [id number] Date of Birth/Sex: 05/30/1956 (65 y.o. M) Treating RN: Donnamarie Poag Primary Care Henslee Lottman: [name] Other Clinician: Referring Sister Carbone: [name] Treating Jaisen Wiltrout/Extender: Yaakov Guthrie in Treatment: 3 Wound Status Wound Number: 1 Primary Etiology: Diabetic Wound/Ulcer of the Lower Extremity Wound Location: Right, Plantar Foot Wound Status: Open Wounding Event: Blister Comorbid History: Type II Diabetes, Osteoarthritis Date Acquired: 07/15/2020 Weeks Of Treatment: 3 Clustered Wound: No Photos Wound Measurements Length: (cm) 1.6 Width: (cm) 0.9 Depth: (cm) 0.3 Area: (cm) 1.131 Volume: (cm) 0.339 % Reduction in Area: -0.7% % Reduction in Volume: 49.7% Epithelialization: None Tunneling: No Undermining: No Wound Description Classification: Grade 2 Wound Margin: Thickened Exudate Amount: Medium Exudate Type: Serosanguineous Exudate  Color: red, brown Foul Odor After Cleansing: No Slough/Fibrino Yes Wound Bed Granulation Amount: Large (67-100%) Exposed Structure Granulation Quality: Red,  Pink Fascia Exposed: No Necrotic Amount: Small (1-33%) Fat Layer (Subcutaneous Tissue) Exposed: Yes Necrotic Quality: Adherent Slough Tendon Exposed: No Muscle Exposed: No Joint Exposed: No Bone Exposed: No Treatment Notes Wound #1 (Foot) Wound Laterality: Plantar, Right Cleanser [place] Ancillary Kit - 15 Day Supply Discharge Instruction: Use supplies as instructed; Kit contains: (15) Saline Bullets; (15) 3x3 Gauze; 15 pr Gloves Soap and Water Cheong, Riad ([id number]) Discharge Instruction: Gently cleanse wound with antibacterial soap, rinse and pat dry prior to dressing wounds Wound Cleanser Discharge Instruction: Wash your hands with soap and water. Remove old dressing, discard into plastic bag and place into trash. Cleanse the wound with Wound Cleanser prior to applying a clean dressing using gauze sponges, not tissues or cotton balls. Do not scrub or use excessive force. Pat dry using gauze sponges, not tissue or cotton balls. Peri-Wound Care Topical Gentamicin Discharge Instruction: IN THE OFFICE ONLY-Apply as directed by Landon Bassford. Primary Dressing Hydrofera Blue Ready Transfer Foam, 2.5x2.5 (in/in) Discharge Instruction: CUT TO FIT THE WOUND BED-Apply Hydrofera Blue Ready to wound bed as directed Secondary Dressing ABD Pad 5x9 (in/in) Discharge Instruction: Cover with ABD pad/may cut in half Secured With 42M [place] Surgical Tape, 2x2 (in/yd) Compression Wrap Compression Stockings Add-Ons Electronic Signature(s) Signed: 12/01/2021 12:24:38 PM By: Donnamarie Poag Entered By: Donnamarie Poag on 12/01/2021 08:46:14 [name] ([id number]) -------------------------------------------------------------------------------- Wound Assessment Details Patient Name: [name] Date of Service: 12/01/2021 8:30 AM Medical Record Number: [id number] Patient Account Number: [id number] Date of Birth/Sex: Mar 22, 1956 (65 y.o. M) Treating RN: Donnamarie Poag Primary Care Taz Vanness:  [name] Other Clinician: Referring Crixus Mcaulay: [name] Treating Ryu Cerreta/Extender: Yaakov Guthrie in Treatment: 3 Wound Status Wound Number: 2 Primary Etiology: Diabetic Wound/Ulcer of the Lower Extremity Wound Location: Left Toe Great Wound Status: Open Wounding Event: Gradually Appeared Comorbid History: Type II Diabetes, Osteoarthritis Date Acquired: 11/25/2021 Weeks Of Treatment: 0 Clustered Wound: No Photos Wound Measurements Length: (cm) 4 Width: (cm) 2.5 Depth: (cm) 0.5 Area: (cm) 7.854 Volume: (cm) 3.927 % Reduction in Area: % Reduction in Volume: Tunneling: No Undermining: No Wound Description Classification: Grade 2 Exudate Amount: Medium Exudate Type: Serosanguineous Exudate Color: red, brown Foul Odor After Cleansing: No Slough/Fibrino Yes Wound Bed Granulation Amount: Small (1-33%) Exposed Structure Granulation Quality: Red Fascia Exposed: No Necrotic Amount: Large (67-100%) Fat Layer (Subcutaneous Tissue) Exposed: Yes Necrotic Quality: Eschar, Adherent Slough Tendon Exposed: No Muscle Exposed: No Joint Exposed: No Bone Exposed: No Electronic Signature(s) Signed: 12/01/2021 12:24:38 PM By: Donnamarie Poag Entered By: Donnamarie Poag on 12/01/2021 08:57:17 [name] ([id number]) -------------------------------------------------------------------------------- [place] Details Patient Name: [name] Date of Service: 12/01/2021 8:30 AM Medical Record Number: [id number] Patient Account Number: [id number] Date of Birth/Sex: 05/15/1956 (65 y.o. M) Treating RN: Donnamarie Poag Primary Care Mykeria Garman: [name] Other Clinician: Referring Afia Messenger: [name] Treating Oretha Weismann/Extender: Yaakov Guthrie in Treatment: 3 Vital Signs Time Taken: 08:40 Temperature (F): 98.0 Height (in): 74 Pulse (bpm): 89 Weight (lbs): 226 Respiratory Rate (breaths/min): 16 Body Mass Index (BMI): 29 Blood Pressure (mmHg): 130/78 Reference  Range: 80 - 120 mg / dl Electronic Signature(s) Signed: 12/01/2021 12:24:38 PM By: Donnamarie Poag Entered ByDonnamarie Poag on 12/01/2021 08:42:21

## 2021-12-01 NOTE — Progress Notes (Signed)
DACODA, FINLAY ([id number]) Visit Report for 12/01/2021 Chief Complaint Document Details Patient Name: [name], [name] Date of Service: 12/01/2021 8:30 AM Medical Record Number: [id number] Patient Account Number: [id number] Date of Birth/Sex: June 26, 1956 (66 y.o. M) Treating RN: Donnamarie Poag Primary Care Provider: [name] Other Clinician: Referring Provider: [name] Treating Provider/Extender: Yaakov Guthrie in Treatment: 3 Information Obtained from: Patient Chief Complaint Right plantar foot wound Electronic Signature(s) Signed: 12/01/2021 10:32:32 AM By: Kalman Shan DO Entered By: Kalman Shan on 12/01/2021 10:22:57 [name] ([id number]) -------------------------------------------------------------------------------- Debridement Details Patient Name: [name] Date of Service: 12/01/2021 8:30 AM Medical Record Number: [id number] Patient Account Number: [id number] Date of Birth/Sex: May 25, 1956 (66 y.o. M) Treating RN: Donnamarie Poag Primary Care Provider: [name] Other Clinician: Referring Provider: [name] Treating Provider/Extender: Yaakov Guthrie in Treatment: 3 Debridement Performed for Wound #2 Left Toe Great Assessment: Performed By: Physician Kalman Shan, MD Debridement Type: Debridement Severity of Tissue Pre Debridement: Fat layer exposed Level of Consciousness (Pre- Awake and Alert procedure): Pre-procedure Verification/Time Out Yes - 09:04 Taken: Start Time: 09:05 Pain Control: Lidocaine Total Area Debrided (L x W): 4 (cm) x 2.5 (cm) = 10 (cm) Tissue and other material Viable, Non-Viable, Callus, Slough, Subcutaneous, Slough debrided: Level: Skin/Subcutaneous Tissue Debridement Description: Excisional Instrument: Blade, Forceps, Scissors Bleeding: Minimum Hemostasis Achieved: Pressure Response to Treatment: Procedure was tolerated well Level of Consciousness (Post- Awake and Alert procedure): Post  Debridement Measurements of Total Wound Length: (cm) 4 Width: (cm) 2.5 Depth: (cm) 0.5 Volume: (cm) 3.927 Character of Wound/Ulcer Post Debridement: Improved Severity of Tissue Post Debridement: Fat layer exposed Post Procedure Diagnosis Same as Pre-procedure Electronic Signature(s) Signed: 12/01/2021 10:32:32 AM By: Kalman Shan DO Signed: 12/01/2021 12:24:38 PM By: Donnamarie Poag Entered By: Donnamarie Poag on 12/01/2021 09:09:52 [name] ([id number]) -------------------------------------------------------------------------------- HPI Details Patient Name: [name] Date of Service: 12/01/2021 8:30 AM Medical Record Number: [id number] Patient Account Number: [id number] Date of Birth/Sex: [birth year] (66 y.o. M) Treating RN: Donnamarie Poag Primary Care Provider: [name] Other Clinician: Referring Provider: [name] Treating Provider/Extender: Yaakov Guthrie in Treatment: 3 History of Present Illness HPI Description: Admission 11/10/2021 Mr. Robinson Brinkley is a 66 year old male with a past medical history of uncontrolled type 2 diabetes with last hemoglobin G5O of 8.1 complicated by peripheral neuropathy that presents to the clinic for a 2-year history of nonhealing ulcer to the bottom of his right foot. He states this started out as a blister caused by a work boot. He reports receiving wound care when he resided in [place]. He is reestablishing his wound care in [facility] today. He is currently keeping the area clean and covered. He has insoles designed to help offload the wound bed. He currently denies signs of infection. 1/18; patient presents for follow-up. He has been using Hydrofera Blue to the wound bed. He has no issues or complaints today. He has not received the defender.[name] He denies signs of infection. 2/1; patient presents for follow-up. He has been using Hydrofera Blue to the wound bed he states he received the defender boot and has  been using it however he did not have it on today. He currently denies signs of infection to the right foot. Unfortunately he developed a wound to the left great toe over the past week. He states he received new orthotics and these caused a blister to the left great toe which turned into a wound. He has not been doing anything to the wound bed. He currently denies systemic  signs of infection. Electronic Signature(s) Signed: 12/01/2021 10:32:32 AM By: Kalman Shan DO Entered By: Kalman Shan on 12/01/2021 10:25:57 [name] ([id number]) -------------------------------------------------------------------------------- Physical Exam Details Patient Name: [name] Date of Service: 12/01/2021 8:30 AM Medical Record Number: [id number] Patient Account Number: [id number] Date of Birth/Sex: [birth year] (66 y.o. M) Treating RN: Donnamarie Poag Primary Care Provider: [name] Other Clinician: Referring Provider: [name] Treating Provider/Extender: Yaakov Guthrie in Treatment: 3 Constitutional . Cardiovascular . Psychiatric . Notes Right foot: To the plantar aspect there is a punched-out ulcer with increased depth in the center. No probing to bone. Mostly granulation tissue present with some scant nonviable tissue. No drainage noted. No surrounding signs of infection. Left foot: To the left great toe there is a large open wound with nonviable tissue. There is callus and devitalized tissue with scant granulation tissue circumferentially. No purulent drainage. Electronic Signature(s) Signed: 12/01/2021 10:32:32 AM By: Kalman Shan DO Entered By: Kalman Shan on 12/01/2021 10:28:17 [name] ([id number]) -------------------------------------------------------------------------------- Physician Orders Details Patient Name: [name] Date of Service: 12/01/2021 8:30 AM Medical Record Number: [id number] Patient Account Number: [id number] Date of Birth/Sex:  [birth year] (66 y.o. M) Treating RN: Donnamarie Poag Primary Care Provider: [name] Other Clinician: Referring Provider: [name] Treating Provider/Extender: Yaakov Guthrie in Treatment: 3 Verbal / Phone Orders: No Diagnosis Coding Follow-up Appointments o Return Appointment in 1 week. o Nurse Visit as needed Bathing/ Shower/ Hygiene o May shower; gently cleanse wound with antibacterial soap, rinse and pat dry prior to dressing wounds - change dressing after shower or keep dressing dry; keep covered o No tub bath. Anesthetic (Use 'Patient Medications' Section for Anesthetic Order Entry) o Lidocaine applied to wound bed Edema Control - Lymphedema / Segmental Compressive Device / Other o Elevate legs to the level of the heart and pump ankles as often as possible o Elevate leg(s) parallel to the floor when sitting. o DO YOUR BEST to sleep in the bed at night. DO NOT sleep in your recliner. Long hours of sitting in a recliner leads to swelling of the legs and/or potential wounds on your backside. Off-Loading o Foot Defender o - Right foot-MAKE SURE A SOCK OR STOCKINETTE COVERS YOUR SKIN AND NOT IN CONTACT WITH THE BOOT o Open toe surgical shoe - left Additional Orders / Instructions o Follow Nutritious Diet and Increase Protein Intake Medications-Please add to medication list. o P.O. Antibiotics - Pick up antibiotics and start today as directed Wound Treatment Wound #1 - Foot Wound Laterality: Plantar, Right Cleanser: Byram Ancillary Kit - 15 Day Supply (Generic) 1 x Per Day/15 Days Discharge Instructions: Use supplies as instructed; Kit contains: (15) Saline Bullets; (15) 3x3 Gauze; 15 pr Gloves Cleanser: Soap and Water 1 x Per Day/15 Days Discharge Instructions: Gently cleanse wound with antibacterial soap, rinse and pat dry prior to dressing wounds Cleanser: Wound Cleanser 1 x Per Day/15 Days Discharge Instructions: Wash your hands with soap  and water. Remove old dressing, discard into plastic bag and place into trash. Cleanse the wound with Wound Cleanser prior to applying a clean dressing using gauze sponges, not tissues or cotton balls. Do not scrub or use excessive force. Pat dry using gauze sponges, not tissue or cotton balls. Topical: Gentamicin 1 x Per Day/15 Days Discharge Instructions: IN THE OFFICE ONLY-Apply as directed by provider. Primary Dressing: Hydrofera Blue Ready Transfer Foam, 2.5x2.5 (in/in) (Generic) 1 x Per Day/15 Days Discharge Instructions: CUT TO FIT THE WOUND BED-Apply Hydrofera Blue Ready to wound  bed as directed Secondary Dressing: ABD Pad 5x9 (in/in) (Generic) 1 x Per Day/15 Days Discharge Instructions: Cover with ABD pad/may cut in half Secured With: 31M [place] Surgical Tape, 2x2 (in/yd) (Generic) 1 x Per Day/15 Days Wound #2 - Toe Great Wound Laterality: Left Cleanser: Byram Ancillary Kit - 15 Day Supply (DME) (Generic) 1 x Per Day/15 Days Discharge Instructions: Use supplies as instructed; Kit contains: (15) Saline Bullets; (15) 3x3 Gauze; 15 pr Gloves [name], [name] ([id number]) Cleanser: Soap and Water 1 x Per Day/15 Days Discharge Instructions: Gently cleanse wound with antibacterial soap, rinse and pat dry prior to dressing wounds Cleanser: Wound Cleanser 1 x Per Day/15 Days Discharge Instructions: Wash your hands with soap and water. Remove old dressing, discard into plastic bag and place into trash. Cleanse the wound with Wound Cleanser prior to applying a clean dressing using gauze sponges, not tissues or cotton balls. Do not scrub or use excessive force. Pat dry using gauze sponges, not tissue or cotton balls. Topical: Gentamicin 1 x Per Day/15 Days Discharge Instructions: IN THE OFFICE ONLY-Apply as directed by provider. Primary Dressing: Hydrofera Blue Ready Transfer Foam, 2.5x2.5 (in/in) (DME) (Generic) 1 x Per Day/15 Days Discharge Instructions: CUT TO FIT THE WOUND  BED-Apply Hydrofera Blue Ready to wound bed as directed Secondary Dressing: ABD Pad 5x9 (in/in) (DME) (Generic) 1 x Per Day/15 Days Discharge Instructions: Cover with ABD pad/may cut in half Secured With: 31M Medipore H Soft Cloth Surgical Tape, 2x2 (in/yd) (DME) (Generic) 1 x Per Day/15 Days Patient Medications Allergies: No Known Allergies Notifications Medication Indication Start End Augmentin 12/01/2021 DOSE 1 - oral 875 mg-125 mg tablet - 1 tablet oral BID x 10 days Electronic Signature(s) Signed: 12/01/2021 10:32:32 AM By: Kalman Shan DO Previous Signature: 12/01/2021 9:19:03 AM Version By: Kalman Shan DO Entered By: Kalman Shan on 12/01/2021 10:31:50 [name] ([id number]) -------------------------------------------------------------------------------- Problem List Details Patient Name: [name] Date of Service: 12/01/2021 8:30 AM Medical Record Number: [id number] Patient Account Number: [id number] Date of Birth/Sex: [birth year] (66 y.o. M) Treating RN: Donnamarie Poag Primary Care Provider: [name] Other Clinician: Referring Provider: [name] Treating Provider/Extender: Yaakov Guthrie in Treatment: 3 Active Problems ICD-10 Encounter Code Description Active Date MDM Diagnosis L97.512 Non-pressure chronic ulcer of other part of right foot with fat layer 11/10/2021 No Yes exposed L97.528 Non-pressure chronic ulcer of other part of left foot with other specified 12/01/2021 No Yes severity E11.621 Type 2 diabetes mellitus with foot ulcer 11/10/2021 No Yes E11.40 Type 2 diabetes mellitus with diabetic neuropathy, unspecified 11/10/2021 No Yes R26.89 Other abnormalities of gait and mobility 11/10/2021 No Yes Inactive Problems Resolved Problems Electronic Signature(s) Signed: 12/01/2021 10:32:32 AM By: Kalman Shan DO Entered By: Kalman Shan on 12/01/2021 10:22:47 [name]  ([id number]) -------------------------------------------------------------------------------- Progress Note Details Patient Name: [name] Date of Service: 12/01/2021 8:30 AM Medical Record Number: [id number] Patient Account Number: [id number] Date of Birth/Sex: [birth year] (66 y.o. M) Treating RN: Donnamarie Poag Primary Care Provider: [name] Other Clinician: Referring Provider: [name] Treating Provider/Extender: Yaakov Guthrie in Treatment: 3 Subjective Chief Complaint Information obtained from Patient Right plantar foot wound History of Present Illness (HPI) Admission 11/10/2021 Mr. Jamerius Boeckman is a 66 year old male with a past medical history of uncontrolled type 2 diabetes with last hemoglobin S1J of 8.1 complicated by peripheral neuropathy that presents to the clinic for a 2-year history of nonhealing ulcer to the bottom of his right foot. He states this started out as a  blister caused by a work boot. He reports receiving wound care when he resided in [place]. He is reestablishing his wound care in [facility] today. He is currently keeping the area clean and covered. He has insoles designed to help offload the wound bed. He currently denies signs of infection. 1/18; patient presents for follow-up. He has been using Hydrofera Blue to the wound bed. He has no issues or complaints today. He has not received the defender.[name] He denies signs of infection. 2/1; patient presents for follow-up. He has been using Hydrofera Blue to the wound bed he states he received the defender boot and has been using it however he did not have it on today. He currently denies signs of infection to the right foot. Unfortunately he developed a wound to the left great toe over the past week. He states he received new orthotics and these caused a blister to the left great toe which turned into a wound. He has not been doing anything to the wound bed. He currently denies  systemic signs of infection. Objective Constitutional Vitals Time Taken: 8:40 AM, Height: 74 in, Weight: 226 lbs, BMI: 29, Temperature: 98.0 F, Pulse: 89 bpm, Respiratory Rate: 16 breaths/min, Blood Pressure: 130/78 mmHg. General Notes: Right foot: To the plantar aspect there is a punched-out ulcer with increased depth in the center. No probing to bone. Mostly granulation tissue present with some scant nonviable tissue. No drainage noted. No surrounding signs of infection. Left foot: To the left great toe there is a large open wound with nonviable tissue. There is callus and devitalized tissue with scant granulation tissue circumferentially. No purulent drainage. Integumentary (Hair, Skin) Wound #1 status is Open. Original cause of wound was Blister. The date acquired was: 07/15/2020. The wound has been in treatment 3 weeks. The wound is located on the [place]. The wound measures 1.6cm length x 0.9cm width x 0.3cm depth; 1.131cm^2 area and 0.339cm^3 volume. There is Fat Layer (Subcutaneous Tissue) exposed. There is no tunneling or undermining noted. There is a medium amount of serosanguineous drainage noted. The wound margin is thickened. There is large (67-100%) red, pink granulation within the wound bed. There is a small (1-33%) amount of necrotic tissue within the wound bed including Adherent Slough. Wound #2 status is Open. Original cause of wound was Gradually Appeared. The date acquired was: 11/25/2021. The wound is located on the Left Toe Great. The wound measures 4cm length x 2.5cm width x 0.5cm depth; 7.854cm^2 area and 3.927cm^3 volume. There is Fat Layer (Subcutaneous Tissue) exposed. There is no tunneling or undermining noted. There is a medium amount of serosanguineous drainage noted. There is small (1-33%) red granulation within the wound bed. There is a large (67-100%) amount of necrotic tissue within the wound bed including Eschar and Adherent Slough. 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 No signs of infection. I recommended continuing Hydrofera Blue and the Conservation officer, nature. Unfortunately he developed a wound to the left great toe Caused by new orthotic shoes. I debrided nonviable tissue. Due to the appearance and the fact that he is an uncontrolled diabetic I will start him on antibiotics. He is at high risk for amputation.  We will try Hydrofera Blue to this area and I recommended also offloading the area with open toed shoes. He is no longer using the orthotic shoes that caused him issues. Follow-up in 1 week. Procedures Wound #2 Pre-procedure diagnosis of Wound #2 is a Diabetic Wound/Ulcer of the Lower Extremity located on the Left Toe Great .Severity of Tissue Pre Debridement is: Fat layer exposed. There was a Excisional Skin/Subcutaneous Tissue Debridement with a total area of 10 sq cm performed by Kalman Shan, MD. With the following instrument(s): Blade, Forceps, and Scissors to remove Viable and Non-Viable tissue/material. Material removed includes Callus, Subcutaneous Tissue, and Slough after achieving pain control using Lidocaine. A time out was conducted at 09:04, prior to the start of the procedure. A Minimum amount of bleeding was controlled with Pressure. The procedure was tolerated well. Post Debridement Measurements: 4cm length x 2.5cm width x 0.5cm depth; 3.927cm^3 volume. Character of Wound/Ulcer Post Debridement is improved. Severity of Tissue Post Debridement is: Fat layer exposed. Post procedure Diagnosis Wound #2: Same as Pre-Procedure Plan Follow-up Appointments: Return  Appointment in 1 week. Nurse Visit as needed Bathing/ Shower/ Hygiene: May shower; gently cleanse wound with antibacterial soap, rinse and pat dry prior to dressing wounds - change dressing after shower or keep dressing dry; keep covered No tub bath. Anesthetic (Use 'Patient Medications' Section for Anesthetic Order Entry): Lidocaine applied to wound bed Edema Control - Lymphedema / Segmental Compressive Device / Other: Elevate legs to the level of the heart and pump ankles as often as possible Elevate leg(s) parallel to the floor when sitting. DO YOUR BEST to sleep in the bed at night. DO NOT sleep in your recliner. Long hours of sitting in a recliner leads to swelling of the legs and/or potential wounds on your backside. Off-Loading: Foot Defender  - Right foot-MAKE SURE A SOCK OR STOCKINETTE COVERS YOUR SKIN AND NOT IN CONTACT WITH THE BOOT Open toe surgical shoe - left Additional Orders / Instructions: Follow Nutritious Diet and Increase Protein Intake Medications-Please add to medication list.: P.O. Antibiotics - Pick up antibiotics and start today as directed The following medication(s) was prescribed: Augmentin oral 875 mg-125 mg tablet 1 1 tablet oral BID x 10 days starting 12/01/2021 WOUND #1: - Foot Wound Laterality: Plantar, Right Cleanser: Byram Ancillary Kit - 15 Day Supply (Generic) 1 x Per Day/15 Days Discharge Instructions: Use supplies as instructed; Kit contains: (15) Saline Bullets; (15) 3x3 Gauze; 15 pr Gloves Cleanser: Soap and Water 1 x Per Day/15 Days Discharge Instructions: Gently cleanse wound with antibacterial soap, rinse and pat dry prior to dressing wounds Cleanser: Wound Cleanser 1 x Per Day/15 Days Discharge Instructions: Wash your hands with soap and water. Remove old dressing, discard into plastic bag and place into trash. Cleanse the wound with Wound Cleanser prior to applying a clean dressing using gauze sponges, not tissues or cotton balls. Do not  scrub or use excessive force. Pat dry using gauze sponges, not tissue or cotton balls. Topical: Gentamicin 1 x Per Day/15 Days Discharge Instructions: IN THE OFFICE ONLY-Apply as directed by provider. Primary Dressing: Hydrofera Blue Ready Transfer Foam, 2.5x2.5 (in/in) (Generic) 1 x Per Day/15 Days Discharge Instructions: CUT TO FIT THE WOUND BED-Apply Hydrofera Blue Ready to wound bed as directed Secondary Dressing: ABD Pad 5x9 (in/in) (Generic) 1 x Per Day/15 Days Discharge Instructions: Cover with ABD pad/may cut in half Secured With: 39M [place] Surgical Tape, 2x2 (in/yd) (Generic) 1 x Per Day/15 Days WOUND #  2: - Toe Great Wound Laterality: Left [name], [name] ([id number]) Cleanser: Byram Ancillary Kit - 15 Day Supply (DME) (Generic) 1 x Per Day/15 Days Discharge Instructions: Use supplies as instructed; Kit contains: (15) Saline Bullets; (15) 3x3 Gauze; 15 pr Gloves Cleanser: Soap and Water 1 x Per Day/15 Days Discharge Instructions: Gently cleanse wound with antibacterial soap, rinse and pat dry prior to dressing wounds Cleanser: Wound Cleanser 1 x Per Day/15 Days Discharge Instructions: Wash your hands with soap and water. Remove old dressing, discard into plastic bag and place into trash. Cleanse the wound with Wound Cleanser prior to applying a clean dressing using gauze sponges, not tissues or cotton balls. Do not scrub or use excessive force. Pat dry using gauze sponges, not tissue or cotton balls. Topical: Gentamicin 1 x Per Day/15 Days Discharge Instructions: IN THE OFFICE ONLY-Apply as directed by provider. Primary Dressing: Hydrofera Blue Ready Transfer Foam, 2.5x2.5 (in/in) (DME) (Generic) 1 x Per Day/15 Days Discharge Instructions: CUT TO FIT THE WOUND BED-Apply Hydrofera Blue Ready to wound bed as directed Secondary Dressing: ABD Pad 5x9 (in/in) (DME) (Generic) 1 x Per Day/15 Days Discharge Instructions: Cover with ABD pad/may cut in half Secured With: 81M  [place] Surgical Tape, 2x2 (in/yd) (DME) (Generic) 1 x Per Day/15 Days 1. Augmentin 2. Hydrofera Blue 3. Aggressive offloading defender boot, open toed shoe 4. Follow-up in 1 week 5. In office sharp debridement Electronic Signature(s) Signed: 12/01/2021 10:32:32 AM By: Kalman Shan DO Entered By: Kalman Shan on 12/01/2021 10:30:47 [name] ([id number]) -------------------------------------------------------------------------------- SuperBill Details Patient Name: [name] Date of Service: 12/01/2021 Medical Record Number: [id number] Patient Account Number: [id number] Date of Birth/Sex: 08-Mar-1956 (66 y.o. M) Treating RN: Donnamarie Poag Primary Care Provider: [name] Other Clinician: Referring Provider: [name] Treating Provider/Extender: Yaakov Guthrie in Treatment: 3 Diagnosis Coding ICD-10 Codes Code Description [phone] 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ed E11.621 Type 2 diabetes mellitus with foot ulcer E11.40 Type 2 diabetes mellitus with diabetic neuropathy, unspecified R26.89 Other abnormalities of gait and mobility L97.528 Non-pressure chronic ulcer of other part of left foot with other specified severity Facility Procedures CPT4 Code: [id number] Description: 38184 - DEB SUBQ TISSUE 20 SQ CM/< Modifier: Quantity: 1 CPT4 Code: Description: ICD-10 Diagnosis Description L97.528 Non-pressure chronic ulcer of other part of left foot with other specified Modifier: severity Quantity: Physician Procedures CPT4 Code: [id number] Description: 99214 - WC PHYS LEVEL 4 - EST PT Modifier: Quantity: 1 CPT4 Code: Description: ICD-10 Diagnosis Description L97.528 Non-pressure chronic ulcer of other part of left foot with other specified E11.621 Type 2 diabetes mellitus with foot ulcer E11.40 Type 2 diabetes mellitus with diabetic neuropathy, unspecified Modifier: severity Quantity: CPT4 Code:  [id number] Description: 11042 - WC PHYS SUBQ TISS 20 SQ CM Modifier: Quantity: 1 CPT4 Code: Description: ICD-10 Diagnosis Description L97.528 Non-pressure chronic ulcer of other part of left foot with other specified Modifier: severity Quantity: Electronic Signature(s) Signed: 12/01/2021 10:32:32 AM By: Kalman Shan DO Previous Signature: 12/01/2021 10:04:00 AM Version By: Donnamarie Poag Entered By: Kalman Shan on 12/01/2021 10:31:11

## 2021-12-03 DIAGNOSIS — L97512 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ed: Secondary | ICD-10-CM | POA: Diagnosis not present

## 2021-12-06 ENCOUNTER — Encounter: Payer: Self-pay | Admitting: Emergency Medicine

## 2021-12-06 ENCOUNTER — Observation Stay
Admission: EM | Admit: 2021-12-06 | Discharge: 2021-12-07 | Disposition: A | Discharge status: Home / Self Care | Payer: Medicare Other | Attending: Hospitalist | Admitting: Hospitalist

## 2021-12-06 ENCOUNTER — Emergency Department: Payer: Medicare Other

## 2021-12-06 ENCOUNTER — Inpatient Hospital Stay: Payer: Medicare Other

## 2021-12-06 ENCOUNTER — Other Ambulatory Visit: Payer: Self-pay

## 2021-12-06 ENCOUNTER — Inpatient Hospital Stay: Admit: 2021-12-06 | Payer: Medicare Other

## 2021-12-06 DIAGNOSIS — Z20822 Contact with and (suspected) exposure to covid-19: Secondary | ICD-10-CM | POA: Insufficient documentation

## 2021-12-06 DIAGNOSIS — R2689 Other abnormalities of gait and mobility: Secondary | ICD-10-CM | POA: Insufficient documentation

## 2021-12-06 DIAGNOSIS — N179 Acute kidney failure, unspecified: Secondary | ICD-10-CM | POA: Diagnosis not present

## 2021-12-06 DIAGNOSIS — I359 Nonrheumatic aortic valve disorder, unspecified: Secondary | ICD-10-CM | POA: Diagnosis not present

## 2021-12-06 DIAGNOSIS — E119 Type 2 diabetes mellitus without complications: Secondary | ICD-10-CM | POA: Diagnosis not present

## 2021-12-06 DIAGNOSIS — E871 Hypo-osmolality and hyponatremia: Secondary | ICD-10-CM

## 2021-12-06 DIAGNOSIS — R471 Dysarthria and anarthria: Secondary | ICD-10-CM

## 2021-12-06 DIAGNOSIS — R531 Weakness: Secondary | ICD-10-CM | POA: Diagnosis present

## 2021-12-06 DIAGNOSIS — I639 Cerebral infarction, unspecified: Principal | ICD-10-CM | POA: Insufficient documentation

## 2021-12-06 HISTORY — DX: Type 2 diabetes mellitus without complications: E11.9

## 2021-12-06 HISTORY — DX: Gout, unspecified: M10.9

## 2021-12-06 LAB — LIPID PANEL
Cholesterol: 130 mg/dL (ref 0–200)
HDL: 27 mg/dL — ABNORMAL LOW (ref >40)
LDL Cholesterol: 86 mg/dL (ref 0–99)
Total CHOL/HDL Ratio: 4.8 RATIO
Triglycerides: 84 mg/dL (ref <150)
VLDL: 17 mg/dL (ref 0–40)

## 2021-12-06 LAB — DIFFERENTIAL
Abs Immature Granulocytes: 0.03 10*3/uL (ref 0.00–0.07)
Basophils Absolute: 0.1 10*3/uL (ref 0.0–0.1)
Basophils Relative: 1 %
Eosinophils Absolute: 0.1 10*3/uL (ref 0.0–0.5)
Eosinophils Relative: 1 %
Immature Granulocytes: 0 %
Lymphocytes Relative: 22 %
Lymphs Abs: 1.9 10*3/uL (ref 0.7–4.0)
Monocytes Absolute: 0.6 10*3/uL (ref 0.1–1.0)
Monocytes Relative: 7 %
Neutro Abs: 6 10*3/uL (ref 1.7–7.7)
Neutrophils Relative %: 69 %

## 2021-12-06 LAB — RESP PANEL BY RT-PCR (FLU A&B, COVID) ARPGX2
Influenza A by PCR: NEGATIVE
Influenza B by PCR: NEGATIVE
SARS Coronavirus 2 by RT PCR: NEGATIVE

## 2021-12-06 LAB — CBC
HCT: 37.2 % — ABNORMAL LOW (ref 39.0–52.0)
Hemoglobin: 11.8 g/dL — ABNORMAL LOW (ref 13.0–17.0)
MCH: 29.4 pg (ref 26.0–34.0)
MCHC: 31.7 g/dL (ref 30.0–36.0)
MCV: 92.8 fL (ref 80.0–100.0)
Platelets: 349 10*3/uL (ref 150–400)
RBC: 4.01 MIL/uL — ABNORMAL LOW (ref 4.22–5.81)
RDW: 15.4 % (ref 11.5–15.5)
WBC: 8.7 10*3/uL (ref 4.0–10.5)
nRBC: 0 % (ref 0.0–0.2)

## 2021-12-06 LAB — APTT: aPTT: 39 seconds — ABNORMAL HIGH (ref 24–36)

## 2021-12-06 LAB — GLUCOSE, CAPILLARY
Glucose-Capillary: 105 mg/dL — ABNORMAL HIGH (ref 70–99)
Glucose-Capillary: 109 mg/dL — ABNORMAL HIGH (ref 70–99)
Glucose-Capillary: 191 mg/dL — ABNORMAL HIGH (ref 70–99)

## 2021-12-06 LAB — PROTIME-INR
INR: 1 (ref 0.8–1.2)
Prothrombin Time: 13.6 seconds (ref 11.4–15.2)

## 2021-12-06 LAB — COMPREHENSIVE METABOLIC PANEL
ALT: 12 U/L (ref 0–44)
AST: 15 U/L (ref 15–41)
Albumin: 3.3 g/dL — ABNORMAL LOW (ref 3.5–5.0)
Alkaline Phosphatase: 69 U/L (ref 38–126)
Anion gap: 8 (ref 5–15)
BUN: 37 mg/dL — ABNORMAL HIGH (ref 8–23)
CO2: 27 mmol/L (ref 22–32)
Calcium: 8.9 mg/dL (ref 8.9–10.3)
Chloride: 97 mmol/L — ABNORMAL LOW (ref 98–111)
Creatinine, Ser: 2.25 mg/dL — ABNORMAL HIGH (ref 0.61–1.24)
GFR, Estimated: 31 mL/min — ABNORMAL LOW (ref >60)
Glucose, Bld: 150 mg/dL — ABNORMAL HIGH (ref 70–99)
Potassium: 4.5 mmol/L (ref 3.5–5.1)
Sodium: 132 mmol/L — ABNORMAL LOW (ref 135–145)
Total Bilirubin: 0.6 mg/dL (ref 0.3–1.2)
Total Protein: 7.7 g/dL (ref 6.5–8.1)

## 2021-12-06 LAB — HEMOGLOBIN A1C
Hgb A1c MFr Bld: 6.7 % — ABNORMAL HIGH (ref 4.8–5.6)
Mean Plasma Glucose: 145.59 mg/dL

## 2021-12-06 MED ORDER — ROSUVASTATIN CALCIUM 20 MG PO TABS
40.0000 mg | ORAL_TABLET | Freq: Every evening | ORAL | Status: DC
  Administered 2021-12-06: 19:00:00 40 mg via ORAL
  Filled 2021-12-06: qty 2

## 2021-12-06 MED ORDER — ASPIRIN EC 81 MG PO TBEC: 81.0000 mg | DELAYED_RELEASE_TABLET | Freq: Every day | ORAL | Status: DC

## 2021-12-06 MED ORDER — ACETAMINOPHEN 160 MG/5ML PO SOLN
650.0000 mg | ORAL | Status: DC | PRN
  Filled 2021-12-06: qty 20.3

## 2021-12-06 MED ORDER — IOHEXOL 350 MG/ML SOLN
75.0000 mL | Freq: Once | INTRAVENOUS | Status: AC | PRN
  Administered 2021-12-06: 75 mL via INTRAVENOUS

## 2021-12-06 MED ORDER — BUPROPION HCL ER (XL) 150 MG PO TB24
150.0000 mg | ORAL_TABLET | Freq: Every day | ORAL | Status: DC
  Administered 2021-12-06 – 2021-12-07 (×2): 150 mg via ORAL
  Filled 2021-12-06 (×2): qty 1

## 2021-12-06 MED ORDER — CLOPIDOGREL BISULFATE 75 MG PO TABS
300.0000 mg | ORAL_TABLET | Freq: Once | ORAL | Status: AC
Start: 2021-12-06 — End: 2021-12-06
  Administered 2021-12-06: 300 mg via ORAL
  Filled 2021-12-06: qty 4

## 2021-12-06 MED ORDER — ACETAMINOPHEN 325 MG PO TABS: 650.0000 mg | ORAL_TABLET | ORAL | Status: DC | PRN

## 2021-12-06 MED ORDER — TRAZODONE HCL 50 MG PO TABS
50.0000 mg | ORAL_TABLET | Freq: Every evening | ORAL | Status: DC | PRN
  Filled 2021-12-06: qty 1

## 2021-12-06 MED ORDER — CLOPIDOGREL BISULFATE 75 MG PO TABS: 75.0000 mg | ORAL_TABLET | Freq: Every day | ORAL | Status: DC

## 2021-12-06 MED ORDER — INSULIN ASPART 100 UNIT/ML IJ SOLN
0.0000 [IU] | Freq: Three times a day (TID) | INTRAMUSCULAR | Status: DC
  Administered 2021-12-06: 2 [IU] via SUBCUTANEOUS
  Administered 2021-12-07: 3 [IU] via SUBCUTANEOUS
  Filled 2021-12-06 (×2): qty 1

## 2021-12-06 MED ORDER — STROKE: EARLY STAGES OF RECOVERY BOOK: Freq: Once | Status: AC

## 2021-12-06 MED ORDER — GABAPENTIN 300 MG PO CAPS
600.0000 mg | ORAL_CAPSULE | Freq: Three times a day (TID) | ORAL | Status: DC
  Administered 2021-12-06 – 2021-12-07 (×4): 600 mg via ORAL
  Filled 2021-12-06 (×4): qty 2

## 2021-12-06 MED ORDER — ASPIRIN 81 MG PO CHEW
324.0000 mg | CHEWABLE_TABLET | Freq: Once | ORAL | Status: AC
  Administered 2021-12-06: 324 mg via ORAL
  Filled 2021-12-06: qty 4

## 2021-12-06 MED ORDER — SENNOSIDES-DOCUSATE SODIUM 8.6-50 MG PO TABS: 1.0000 | ORAL_TABLET | Freq: Every evening | ORAL | Status: DC | PRN

## 2021-12-06 MED ORDER — ACETAMINOPHEN 650 MG RE SUPP: 650.0000 mg | RECTAL | Status: DC | PRN

## 2021-12-06 MED ORDER — LACTATED RINGERS IV SOLN: INTRAVENOUS | Status: DC

## 2021-12-06 MED ORDER — LISINOPRIL 5 MG PO TABS
5.0000 mg | ORAL_TABLET | Freq: Every day | ORAL | Status: DC
  Administered 2021-12-07: 09:00:00 5 mg via ORAL
  Filled 2021-12-06: qty 1

## 2021-12-06 MED ORDER — SODIUM CHLORIDE 0.9% FLUSH
3.0000 mL | Freq: Once | INTRAVENOUS | Status: AC
  Administered 2021-12-06: 3 mL via INTRAVENOUS

## 2021-12-06 MED ORDER — ASPIRIN EC 81 MG PO TBEC
81.0000 mg | DELAYED_RELEASE_TABLET | Freq: Every day | ORAL | Status: DC
  Administered 2021-12-07: 81 mg via ORAL
  Filled 2021-12-06: qty 1

## 2021-12-06 MED ORDER — CLOPIDOGREL BISULFATE 75 MG PO TABS
75.0000 mg | ORAL_TABLET | Freq: Every day | ORAL | Status: DC
  Administered 2021-12-07: 09:00:00 75 mg via ORAL
  Filled 2021-12-06: qty 1

## 2021-12-06 NOTE — Progress Notes (Signed)
OT Cancellation Note  Patient Details Name: Masih Yake MRN: [id number] DOB: 1956/05/24   Cancelled Treatment:    Reason Eval/Treat Not Completed: Patient at procedure or test/ unavailable. Consult received, chart reviewed. Upon attempt, staff preparing to take pt for MRI. Will re-attempt OT evaluation at later date/time as pt is available and appropriate.   Ardeth Perfect., MPH, MS, OTR/L ascom [phone] 12/06/21, 3:17 PM

## 2021-12-06 NOTE — ED Provider Notes (Signed)
[facility] Provider Note    Event Date/Time   First MD Initiated Contact with Patient 12/06/21 1115     (approximate)   History   No chief complaint on file.   HPI  [name] is a 66 y.o. male with past medical history of diabetes who presents with left-sided numbness and weakness.  Patient went to sleep around 10 PM last night and woke up this morning feeling like he could not feel as much on the left side including left side of his face arm and leg.  When he tried to walk he was having difficulty because the left leg would not do what he wanted.  Also feels like his speech is slurred.  No history of CVA.  Denies fevers chills chest pain shortness of breath or headache.    No past medical history on file.  There are no problems to display for this patient.    Physical Exam  Triage Vital Signs: ED Triage Vitals  Enc Vitals Group     BP      Pulse      Resp      Temp      Temp src      SpO2      Weight      Height      Head Circumference      Peak Flow      Pain Score      Pain Loc      Pain Edu?      Excl. in GC?     Most recent vital signs: There were no vitals filed for this visit.   General: Awake, no distress.  CV:  Good peripheral perfusion.  Resp:  Normal effort.  Abd:  No distention.  Neuro:             Awake, Alert, Oriented x 3  Other:  Aox3, nml speech  PERRLA, EOMI Left-sided facial droop Patient has dysarthria, no aphasia Pronator drift on the left 4-5 strength in left lower extremity compared to 5 out of 5 in the right + Left-sided dysmetria, no dysmetria on the right   ED Results / Procedures / Treatments  Labs (all labs ordered are listed, but only abnormal results are displayed) Labs Reviewed  CBC - Abnormal; Notable for the following components:      Result Value   RBC 4.01 (*)    Hemoglobin 11.8 (*)    HCT 37.2 (*)    All other components within normal limits  RESP PANEL BY RT-PCR (FLU A&B, COVID)  ARPGX2  DIFFERENTIAL  PROTIME-INR  APTT  COMPREHENSIVE METABOLIC PANEL  CBG MONITORING, ED     EKG  EKG interpretation performed by myself: NSR, nml axis, nml intervals, no acute ischemic changes    RADIOLOGY I reviewed the CT scan of the brain which does not show any acute intracranial process; agree with radiology report     PROCEDURES:  Critical Care performed: Yes, see critical care procedure note(s)  Procedures  The patient is on the cardiac monitor to evaluate for evidence of arrhythmia and/or significant heart rate changes.   MEDICATIONS ORDERED IN ED: Medications  sodium chloride flush (NS) 0.9 % injection 3 mL (has no administration in time range)  iohexol (OMNIPAQUE) 350 MG/ML injection 75 mL (75 mLs Intravenous Contrast Given 12/06/21 1125)     IMPRESSION / MDM / ASSESSMENT AND PLAN / ED COURSE  I reviewed the triage vital signs and the nursing  notes.                              Differential diagnosis includes, but is not limited to, ischemic CVA, hemorrhage, LVO  This patient is a 66 year old male who presents with left-sided numbness and weakness.  Patient went to bed around 10 PM normal woke up with weakness and numbness on the left side as well as slurred speech.  His exam is notable for left-sided facial droop subjective decrease sensation over the left side of his face, left-sided pronator drift, left dysmetria mildly decreased strength in the left.  Code stroke was called from the field.  CT head obtained which is negative for bleed.  CT angio head and neck ordered by neurology as well.  Discussed with neurology consultant.  Will allow for permissive hypertension and give aspirin.  Admit to the hospitalist service.      FINAL CLINICAL IMPRESSION(S) / ED DIAGNOSES   Final diagnoses:  Left-sided weakness     Rx / DC Orders   ED Discharge Orders     None        Note:  This document was prepared using Dragon voice recognition software and  may include unintentional dictation errors.   Georga Hacking, MD 12/06/21 1147

## 2021-12-06 NOTE — Progress Notes (Signed)
PT Cancellation Note  Patient Details Name: Yang Rack MRN: [id number] DOB: 1956/09/27   Cancelled Treatment:    Reason Eval/Treat Not Completed: Patient not medically ready. Patient here with suspected CVA, MRI pending. Will hold until completed. PT to evaluate when medically appropriate.    Maghan Jessee 12/06/2021, 1:53 PM

## 2021-12-06 NOTE — ED Notes (Signed)
Pts daughter Vernona Rieger [phone]

## 2021-12-06 NOTE — Progress Notes (Signed)
SLP Cancellation Note  Patient Details Name: [name] MRN: [id number] DOB: February 24, 1956   Cancelled treatment:       Reason Eval/Treat Not Completed:  (chart reviewed) Pt admitted to the ED; Code Stroke - left arm and leg along with the left side of his face was feeling numb, w/ droop and slurred speech. Pt only admitted <3 hours ago. Will hold on cognitive-linguistic assessment at this time and f/u tomorrow. Noted pt passed his Yale swallow screen and is on an oral diet. Aspiration precautions recommended and posted in chart.     Jerilynn Som, MS, CCC-SLP Speech Language Pathologist Rehab Services; [facility] [phone] (ascom) Yaphet Smethurst 12/06/2021, 1:19 PM

## 2021-12-06 NOTE — ED Triage Notes (Signed)
Pt ems from home for left facial droop, left arm and leg weakness. Went to bed at 2200 last night and woke at 0730 with symptoms

## 2021-12-06 NOTE — Consult Note (Signed)
Neurology Consultation  Reason for Consult: Code stroke activated via [facility] EMS: Left-sided weakness, slurred speech, left-sided numbness Referring Physician: Dr. Starleen Blue  CC: Left-sided numbness weakness and slurred speech  History is obtained from: Patient, chart  HPI: [name] is a 66 y.o. 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 He reports being in his usual state of health before going to bed last night-says he fell asleep around 10 PM and when he woke up at 730 this morning, he noticed that the left arm and leg along with the left side of his face was feeling numb.  When he attempted to talk, his left face also looked to have a droop.  His speech sounded slurred to him when he attempted to talk.  EMS was called, who evaluated him on scene and activated a code stroke per their protocol-LVO score on fast-ED was less than 4. Patient was seen and evaluated at the bridge in the ER upon arrival. Taken for stat CT head. Preliminary impression on exam not consistent with LVO. Based on history, outside the window for IV thrombolysis  LKW: 10 PM yesterday tpa given?: no, outside the window Premorbid modified Rankin scale (mRS): 0  ROS: Full ROS was performed and is negative except as noted in the HPI.  Past medical history: As above  No family history on file.  Social History:   has no history on file for tobacco use, alcohol use, and drug use.  No tobacco or drug use  Medications  Current Facility-Administered Medications:    aspirin chewable tablet 324 mg, 324 mg, Oral, Once, Rada Hay, MD   sodium chloride flush (NS) 0.9 % injection 3 mL, 3 mL, Intravenous, Once, Starleen Blue, Jennette Dubin, MD No current outpatient medications on  file.  Exam: Current vital signs: BP 103/73    Pulse 74    Resp (!) 23    Ht 6\' 2"  (1.88 m)    Wt 104.3 kg    SpO2 100%    BMI 29.53 kg/m  Vital signs in last 24 hours: Pulse Rate:  [74] 74 (02/06 1200) Resp:  [23] 23 (02/06 1200) BP: (103)/(73) 103/73 (02/06 1200) SpO2:  [100 %] 100 % (02/06 1200) Weight:  [104.3 kg] 104.3 kg (02/06 1215)  GENERAL: Awake, alert in NAD HEENT: - Normocephalic and atraumatic, dry mm, no LN++, no Thyromegally LUNGS - Clear to auscultation bilaterally with no wheezes CV - S1S2 RRR, no m/r/g, equal pulses bilaterally. ABDOMEN - Soft, nontender, nondistended with normoactive BS NEURO:  Mental Status: AA&Ox3  Language: speech is mildly dysarthric but completely comprehensible.  Naming, repetition, fluency, and comprehension intact. Cranial Nerves: PERRL. EOMI, visual fields full, left UMN pattern weakness on the face, facial sensation diminished on the left, hearing intact, tongue/uvula/soft palate midline, normal sternocleidomastoid and trapezius muscle strength. No evidence of tongue atrophy or fibrillations Motor: Left upper extremity with visible drift without hitting the bed in 10 seconds, left lower extremity with visible drift without hitting the bed and 5 seconds.  Right side full strength Tone: is normal and bulk is normal Sensation-diminished on the left in comparison to the right, no extinction Coordination: Difficult to perform on the left, intact on the right Gait- deferred  NIHSS 1a Level of Conscious.: 0 1b LOC Questions: 0 1c  LOC Commands: 0 2 Best Gaze: 0 3 Visual: 0 4 Facial Palsy: 1 5a Motor Arm - left: 1 5b Motor Arm - Right: 0 6a Motor Leg - Left: 1 6b Motor Leg - Right: 0 7 Limb Ataxia: 0 8 Sensory: 1 9 Best Language: 0 10 Dysarthria: 1 11 Extinct. and Inatten.: 0 TOTAL: 5    Labs I have reviewed labs in epic and the results pertinent to this consultation are:   CBC    Component Value Date/Time   WBC 8.7  12/06/2021 1136   RBC 4.01 (L) 12/06/2021 1136   HGB 11.8 (L) 12/06/2021 1136   HCT 37.2 (L) 12/06/2021 1136   PLT 349 12/06/2021 1136   MCV 92.8 12/06/2021 1136   MCH 29.4 12/06/2021 1136   MCHC 31.7 12/06/2021 1136   RDW 15.4 12/06/2021 1136   LYMPHSABS 1.9 12/06/2021 1136   MONOABS 0.6 12/06/2021 1136   EOSABS 0.1 12/06/2021 1136   BASOSABS 0.1 12/06/2021 1136    CMP     Component Value Date/Time   NA 132 (L) 12/06/2021 1136   K 4.5 12/06/2021 1136   CL 97 (L) 12/06/2021 1136   CO2 27 12/06/2021 1136   GLUCOSE 150 (H) 12/06/2021 1136   BUN 37 (H) 12/06/2021 1136   CREATININE 2.25 (H) 12/06/2021 1136   CALCIUM 8.9 12/06/2021 1136   PROT 7.7 12/06/2021 1136   ALBUMIN 3.3 (L) 12/06/2021 1136   AST 15 12/06/2021 1136   ALT 12 12/06/2021 1136   ALKPHOS 69 12/06/2021 1136   BILITOT 0.6 12/06/2021 1136   GFRNONAA 31 (L) 12/06/2021 1136   Imaging I have reviewed the images obtained:  CT-head--aspects 10, no acute changes.  No bleed.  Mild chronic microvascular ischemic changes and a age-indeterminate right thalamic capsular infarct CT angiography of the head and neck-no emergent large vessel occlusion.  Mixed plaque at bifurcation of both internal carotids without significant stenosis.  Widely patent basilar.  Assessment: 66 year old who went to bed normal at around 10 PM yesterday woke up this morning at 7:30 AM with left-sided numbness, weakness of the left upper and lower extremity along with left facial droop and slurred speech.  Brought in for emergent stroke evaluation. Appears to have had a small vessel etiology lacunar infarction causing him to have an acute ischemic stroke.  Unfortunately his last known well is past the window for IV thrombolysis.  His clinical exam was not suggestive of large vessel occlusion and the CT angiography head and neck done emergently confirmed no large vessel occlusion. He would require admission for stroke risk factor evaluation. He is not  on an aspirin at home and has had no history of strokes in the past  Impression: -Acute ischemic stroke-likely small vessel etiology -AKI-creatinine in care everywhere in December 2022 was normal.  Creatinine today is 2.25 with a GFR of 31.  Recommendations: -Admit to hospitalist -Frequent neurochecks -Telemetry -Dual antiplatelets: Plavix load 300 mg x 1 today followed by Plavix 75 mg daily from tomorrow.  Aspirin 81 now and daily. Continue DAPT for 3 weeks and then ASA only. -2D echocardiogram -A1c -Lipid panel -PT -OT -Speech therapy -For permissive hypertension for the next 48 to 72 hours and treat only if systolic is greater than [id number] on a as needed basis. -Long-term blood pressure goal 140/90 or below upon discharge. -Management of the AKI per primary team.  I will follow-up on the above studies with you.  Preliminary plan was discussed with the patient and Dr.  McHugh in the ER  -- Amie Portland, MD Neurologist [facility] Pager: [phone]

## 2021-12-06 NOTE — H&P (Signed)
History and Physical    [name] MatinMichael Yanda AOZ:[id number] DOB: December 10, 1955 DOA: 12/06/2021  PCP: Alm BustardFields, Glenda L, NP   Patient coming from: Home  Chief Complaint: Left sided weakness, numbness, slurred speech  HPI: [name] is a 66 y.o. male with medical history significant for DMT2, HLD, gout who presents for evaluation of numbness and weakness of his left side of his body with slurred speech.  He reports he went to bed around 10 PM last night and was in his normal state of health with no symptoms.  He woke up around 730 this morning and noticed that his left arm and left leg were numb.  He also had numbness to the left side of his face.  When he spoke he felt like his speech was muffled and garbled which was not normal for him.  He states he lives by himself and did not look in the mirror and did not notice any facial droop in regard to the emergency room it was noted he had a mild left facial droop.  Around 1130 when the symptoms did not improve he called EMS and was brought to the emergency room.  He was activated as a code stroke.  He was outside the window for thrombolysis.  Reports he has never had symptoms like this in the past. Reports he recently [place] back to [place].  He lives alone.  Denies tobacco alcohol or illicit drug use.  ED Course: Mr Dorothyann Gibbseely has been hemodynamically stable in the emergency room.  CT of the head did not show any acute bleed or pathology.  CTA of the head and neck were obtained and no LVO's were identified.  I do have a mild AKI on labs and mild hyponatremia.  Reviewing his labs from care everywhere his creatinine has been normal in the past but today is 2.25.  CBC is unremarkable except for mild normocytic, normochromic anemia.  Sodium 132 potassium 4.5 chloride 97 bicarb 27 creatinine 2.25 BUN 37 glucose 150 albumin 3.3 LFTs normal.  COVID and influenza negative.  Patient been seen by neurology, Dr. Wilford CornerArora.  Hospitalist service has been asked to  admit for further management  Review of Systems:  General: Denies fever, chills, weight loss, night sweats.  Denies dizziness.  Denies change in appetite HENT: Denies head trauma, headache, denies change in hearing, tinnitus.  Denies nasal congestion or bleeding.  Denies sore throat, sores in mouth.  Denies difficulty swallowing Eyes: Denies blurry vision, pain in eye, drainage.  Denies discoloration of eyes. Neck: Denies pain.  Denies swelling.  Denies pain with movement. Cardiovascular: Denies chest pain, palpitations.  Denies edema.  Denies orthopnea Respiratory: Denies shortness of breath, cough.  Denies wheezing.  Denies sputum production Gastrointestinal: Denies abdominal pain, swelling.  Denies nausea, vomiting, diarrhea.  Denies melena.  Denies hematemesis. Musculoskeletal: Denies limitation of movement.  Denies deformity or swelling.  Denies pain.  Denies arthralgias or myalgias. Genitourinary: Denies pelvic pain.  Denies urinary frequency or hesitancy.  Denies dysuria.  Skin: Denies rash.  Denies petechiae, purpura, ecchymosis. Neurological: Denies syncope.  Denies seizure activity.  Denies visual change. Psychiatric: Denies depression, anxiety. Denies hallucinations.  Past Medical History:  Diagnosis Date   Diabetes mellitus without complication (HCC)    Gout     Past Surgical History:  Procedure Laterality Date   LAPAROSCOPIC GASTRIC BANDING      Social History  reports that he has never smoked. He has never used smokeless tobacco. He reports that he  does not drink alcohol and does not use drugs.  No Known Allergies  History reviewed. No pertinent family history.   Prior to Admission medications   Medication Sig Start Date End Date Taking? Authorizing Provider  allopurinol (ZYLOPRIM) 300 MG tablet Take 300 mg by mouth daily.   Yes [provider]  buPROPion (WELLBUTRIN XL) 150 MG 24 hr tablet Take 150 mg by mouth daily.   Yes [provider]   gabapentin (NEURONTIN) 300 MG capsule Take 600 mg by mouth 3 (three) times daily.   Yes [provider]  lisinopril (ZESTRIL) 10 MG tablet Take 5 mg by mouth daily.   Yes [provider]  metFORMIN (GLUCOPHAGE-XR) 500 MG 24 hr tablet Take 1,000 mg by mouth 2 (two) times daily with a meal.   Yes [provider]  QUEtiapine (SEROQUEL) 100 MG tablet Take 50 mg by mouth at bedtime.   Yes [provider]  rosuvastatin (CRESTOR) 40 MG tablet Take 40 mg by mouth daily.   Yes [provider]    Physical Exam: Vitals:   12/06/21 1200 12/06/21 1215 12/06/21 1230  BP: 103/73  102/65  Pulse: 74  74  Resp: (!) 23  (!) 21  SpO2: 100%  100%  Weight:  104.3 kg   Height:  6\' 2"  (1.88 m)     Constitutional: NAD, calm, comfortable Vitals:   12/06/21 1200 12/06/21 1215 12/06/21 1230  BP: 103/73  102/65  Pulse: 74  74  Resp: (!) 23  (!) 21  SpO2: 100%  100%  Weight:  104.3 kg   Height:  6\' 2"  (1.88 m)    General: WDWN, Alert and oriented x3.  Eyes: EOMI, PERRL, conjunctivae normal.  Sclera nonicteric HENT:  [place]/AT, external ears normal.  Nares patent without epistasis.  Mucous membranes are moist. Posterior pharynx clear of any exudate  Neck: Soft, normal range of motion, supple, no masses, Trachea midline Respiratory: clear to auscultation bilaterally, no wheezing, no crackles. Normal respiratory effort. No accessory muscle use.  Cardiovascular: Regular rate and rhythm, no murmurs / rubs / gallops. No extremity edema. 2+ pedal pulses. No carotid bruits.  Abdomen: Soft, no tenderness, nondistended, no rebound or guarding.  No masses palpated. Bowel sounds normoactive Musculoskeletal: FROM. no cyanosis. No joint deformity upper and lower extremities. Normal muscle tone.  Skin: Warm, dry, intact no rashes, lesions, ulcers. No induration Neurologic: CN 2-12 grossly intact.  Normal speech. patella DTR +1 bilaterally. Decreased sensation to light touch in left  face, left arm and left leg. Strength 5/5 in right extremities. Strength 3/5 in left extremities. Left drift noted. No tremor.   Psychiatric: Normal judgment and insight. Normal mood.    Labs on Admission: I have personally reviewed following labs and imaging studies  CBC: Recent Labs  Lab 12/06/21 1136  WBC 8.7  NEUTROABS 6.0  HGB 11.8*  HCT 37.2*  MCV 92.8  PLT 349    Basic Metabolic Panel: Recent Labs  Lab 12/06/21 1136  NA 132*  K 4.5  CL 97*  CO2 27  GLUCOSE 150*  BUN 37*  CREATININE 2.25*  CALCIUM 8.9    GFR: Estimated Creatinine Clearance: 41 mL/min (A) (by C-G formula based on SCr of 2.25 mg/dL (H)).  Liver Function Tests: Recent Labs  Lab 12/06/21 1136  AST 15  ALT 12  ALKPHOS 69  BILITOT 0.6  PROT 7.7  ALBUMIN 3.3*    Urine analysis: No results found for: COLORURINE, APPEARANCEUR, LABSPEC, PHURINE, GLUCOSEU,  HGBUR, BILIRUBINUR, KETONESUR, PROTEINUR, UROBILINOGEN, NITRITE, LEUKOCYTESUR  Radiological Exams on Admission: CT HEAD CODE STROKE WO CONTRAST  Result Date: 12/06/2021 CLINICAL DATA:  Code stroke. Neuro deficit, acute, stroke suspected; Stroke, follow up. Left facial droop, left arm weakness EXAM: CT HEAD WITHOUT CONTRAST CT ANGIOGRAPHY OF THE HEAD AND NECK TECHNIQUE: Contiguous axial images were obtained from the base of the skull through the vertex without intravenous contrast. Multidetector CT imaging of the head and neck was performed using the standard protocol during bolus administration of intravenous contrast. Multiplanar CT image reconstructions and MIPs were obtained to evaluate the vascular anatomy. Carotid stenosis measurements (when applicable) are obtained utilizing NASCET criteria, using the distal internal carotid diameter as the denominator. RADIATION DOSE REDUCTION: This exam was performed according to the departmental dose-optimization program which includes automated exposure control, adjustment of the mA and/or kV according to  patient size and/or use of iterative reconstruction technique. CONTRAST:  103mL OMNIPAQUE IOHEXOL 350 MG/ML SOLN COMPARISON:  None. FINDINGS: CT HEAD Brain: There is no acute intracranial hemorrhage, mass effect, or edema. Gray-white differentiation is preserved. There is no extra-axial fluid collection. Prominence of the ventricles and sulci reflects mild parenchymal volume loss. Patchy low-density in the supratentorial white matter is nonspecific but may reflect mild chronic microvascular ischemic changes. Vascular: No hyperdense vessel. Skull: Calvarium is unremarkable. Sinuses/Orbits: No acute finding. Other: None. Review of the MIP images confirms the above findings CTA NECK Aortic arch: Calcified plaque along the arch and patent great vessel origins. There is noncalcified plaque along the proximal left subclavian causing about 50% stenosis. Right carotid system: Patent. Primarily calcified plaque at the bifurcation and proximal internal carotid with minimal stenosis. Left carotid system: Patent. Mixed plaque at the bifurcation and proximal internal carotid with minimal stenosis. Vertebral arteries: Patent. Right vertebral is mildly dominant. There is mild calcified plaque along the right V1 vertebral artery. No stenosis. Skeleton: Cervical spine degenerative changes. Other neck: Unremarkable. Upper chest: No apical lung mass. Review of the MIP images confirms the above findings CTA HEAD Anterior circulation: Intracranial internal carotid arteries are patent. Anterior and middle cerebral arteries are patent. Posterior circulation: Intracranial vertebral arteries are patent. Basilar artery is patent. Major cerebellar artery origins are patent. Posterior cerebral arteries are patent. Venous sinuses: Patent as allowed by contrast bolus timing. Review of the MIP images confirms the above findings IMPRESSION: There is no acute intracranial hemorrhage or evidence of acute infarction. ASPECT score is 10. Mild chronic  microvascular ischemic changes. Possible superimposed age-indeterminate small vessel infarct of the right thalamocapsular region. No large vessel occlusion, hemodynamically significant stenosis, or evidence of dissection. Preliminary results were communicated to Dr. Wilford Corner at 11:31 am on 12/06/2021 by text page via the [facility] messaging system. Electronically Signed   By: Guadlupe Spanish M.D.   On: 12/06/2021 11:57   CT ANGIO HEAD NECK W WO CM (CODE STROKE)  Result Date: 12/06/2021 CLINICAL DATA:  Code stroke. Neuro deficit, acute, stroke suspected; Stroke, follow up. Left facial droop, left arm weakness EXAM: CT HEAD WITHOUT CONTRAST CT ANGIOGRAPHY OF THE HEAD AND NECK TECHNIQUE: Contiguous axial images were obtained from the base of the skull through the vertex without intravenous contrast. Multidetector CT imaging of the head and neck was performed using the standard protocol during bolus administration of intravenous contrast. Multiplanar CT image reconstructions and MIPs were obtained to evaluate the vascular anatomy. Carotid stenosis measurements (when applicable) are obtained utilizing NASCET criteria, using the distal internal carotid diameter as the denominator. RADIATION  DOSE REDUCTION: This exam was performed according to the departmental dose-optimization program which includes automated exposure control, adjustment of the mA and/or kV according to patient size and/or use of iterative reconstruction technique. CONTRAST:  8mL OMNIPAQUE IOHEXOL 350 MG/ML SOLN COMPARISON:  None. FINDINGS: CT HEAD Brain: There is no acute intracranial hemorrhage, mass effect, or edema. Gray-white differentiation is preserved. There is no extra-axial fluid collection. Prominence of the ventricles and sulci reflects mild parenchymal volume loss. Patchy low-density in the supratentorial white matter is nonspecific but may reflect mild chronic microvascular ischemic changes. Vascular: No hyperdense vessel. Skull: Calvarium is  unremarkable. Sinuses/Orbits: No acute finding. Other: None. Review of the MIP images confirms the above findings CTA NECK Aortic arch: Calcified plaque along the arch and patent great vessel origins. There is noncalcified plaque along the proximal left subclavian causing about 50% stenosis. Right carotid system: Patent. Primarily calcified plaque at the bifurcation and proximal internal carotid with minimal stenosis. Left carotid system: Patent. Mixed plaque at the bifurcation and proximal internal carotid with minimal stenosis. Vertebral arteries: Patent. Right vertebral is mildly dominant. There is mild calcified plaque along the right V1 vertebral artery. No stenosis. Skeleton: Cervical spine degenerative changes. Other neck: Unremarkable. Upper chest: No apical lung mass. Review of the MIP images confirms the above findings CTA HEAD Anterior circulation: Intracranial internal carotid arteries are patent. Anterior and middle cerebral arteries are patent. Posterior circulation: Intracranial vertebral arteries are patent. Basilar artery is patent. Major cerebellar artery origins are patent. Posterior cerebral arteries are patent. Venous sinuses: Patent as allowed by contrast bolus timing. Review of the MIP images confirms the above findings IMPRESSION: There is no acute intracranial hemorrhage or evidence of acute infarction. ASPECT score is 10. Mild chronic microvascular ischemic changes. Possible superimposed age-indeterminate small vessel infarct of the right thalamocapsular region. No large vessel occlusion, hemodynamically significant stenosis, or evidence of dissection. Preliminary results were communicated to Dr. Wilford Corner at 11:31 am on 12/06/2021 by text page via the [facility] messaging system. Electronically Signed   By: Guadlupe Spanish M.D.   On: 12/06/2021 11:57    EKG: Independently reviewed.  EKG shows normal sinus rhythm with mild ST increase in inferior leads.  QTc 413  Assessment/Plan Principal  Problem:   CVA (cerebral vascular accident) Mr. Reisman is admitted on medical telemetry floor for CVA.  Will obtain MRI of brain to rule out acute ischemic CVA.  Has had CTA of head and neck with no LVO present. Has been evaluated by Neurology in the ER and appreciate Dr. Bess Harvest recommendations.  Obtain echocardiogram to evaluate for PFO, wall motion and ejection fraction. Hypertension of 220/110 will be allowed for 24 hours per stroke protocol.  After which blood pressure will be slowly reduced to goal level. Antiplatelet therapy with aspirin daily. Continue statin therapy.  Check lipid panel.  Neurochecks per stroke protocol  Active Problems:   Diabetes mellitus type 2, uncomplicated Hold metformin for 48 hours after having IV contrast for CT scan Blood sugars monitored with meals and bedtime and corrective insulin provided as needed.  Check HgbA1c level    AKI (acute kidney injury) IV fluid hydration with LR.  Check electrolytes and renal function in am    Dysarthria Mild.  Speech therapy consulted.    Hyponatremia Very mild. Recheck electrolytes in am  DVT prophylaxis: SCDs for DVT prophylaxis.   Code Status:   DNR. Pt reports he has paperwork stating his desire for DNR and sister at bedside is aware of  his wishes.   Family Communication:  Diagnosis and plan discussed with patient and his family who is at bedside.  He reports understanding and agrees with plan.  Further recommendations to follow as clinical indicated Disposition Plan:   Patient is from:  Home  Anticipated DC to:  Home  Anticipated DC date:  Anticipate 2 midnights in the hospital  Consults called:  Neurology, Dr Wilford CornerArora has evaluated  Admission status:  Inpatient   Claudean SeveranceBradley S Deedee Lybarger MD [facility]  How to contact the [facility] Attending or Consulting provider 7A - 7P or covering provider during after hours 7P -7A, for this patient?   Check the care team in [facility] and look for a) attending/consulting [facility]  provider listed and b) the [facility] team listed Log into [email or web address] and use [place]'s universal password to access. If you do not have the password, please contact the hospital operator. Locate the [facility] provider you are looking for under [facility] and page to a number that you can be directly reached. If you still have difficulty reaching the provider, please page the [facility] (Director on Call) for the Hospitalists listed on amion for assistance.  12/06/2021, 12:52 PM

## 2021-12-07 ENCOUNTER — Inpatient Hospital Stay (HOSPITAL_BASED_OUTPATIENT_CLINIC_OR_DEPARTMENT_OTHER)
Admit: 2021-12-07 | Discharge: 2021-12-07 | Disposition: A | Discharge status: Home / Self Care | Payer: Medicare Other | Attending: Family Medicine | Admitting: Family Medicine

## 2021-12-07 DIAGNOSIS — I639 Cerebral infarction, unspecified: Secondary | ICD-10-CM | POA: Diagnosis not present

## 2021-12-07 DIAGNOSIS — I6389 Other cerebral infarction: Secondary | ICD-10-CM

## 2021-12-07 LAB — BASIC METABOLIC PANEL
Anion gap: 8 (ref 5–15)
BUN: 31 mg/dL — ABNORMAL HIGH (ref 8–23)
CO2: 25 mmol/L (ref 22–32)
Calcium: 9.4 mg/dL (ref 8.9–10.3)
Chloride: 99 mmol/L (ref 98–111)
Creatinine, Ser: 1.64 mg/dL — ABNORMAL HIGH (ref 0.61–1.24)
GFR, Estimated: 46 mL/min — ABNORMAL LOW (ref >60)
Glucose, Bld: 176 mg/dL — ABNORMAL HIGH (ref 70–99)
Potassium: 4.7 mmol/L (ref 3.5–5.1)
Sodium: 132 mmol/L — ABNORMAL LOW (ref 135–145)

## 2021-12-07 LAB — CBC
HCT: 37.9 % — ABNORMAL LOW (ref 39.0–52.0)
Hemoglobin: 12.3 g/dL — ABNORMAL LOW (ref 13.0–17.0)
MCH: 30.1 pg (ref 26.0–34.0)
MCHC: 32.5 g/dL (ref 30.0–36.0)
MCV: 92.9 fL (ref 80.0–100.0)
Platelets: 387 10*3/uL (ref 150–400)
RBC: 4.08 MIL/uL — ABNORMAL LOW (ref 4.22–5.81)
RDW: 15.6 % — ABNORMAL HIGH (ref 11.5–15.5)
WBC: 7.1 10*3/uL (ref 4.0–10.5)
nRBC: 0 % (ref 0.0–0.2)

## 2021-12-07 LAB — ECHOCARDIOGRAM COMPLETE
AR max vel: 2.51 cm2
AV Area VTI: 2.37 cm2
AV Area mean vel: 2.08 cm2
AV Mean grad: 2.5 mmHg
AV Peak grad: 4.2 mmHg
Ao pk vel: 1.03 m/s
Area-P 1/2: 3.08 cm2
Height: 74 in
MV VTI: 2.61 cm2
S' Lateral: 2.6 cm
Weight: 3680 oz

## 2021-12-07 LAB — MAGNESIUM: Magnesium: 2.1 mg/dL (ref 1.7–2.4)

## 2021-12-07 LAB — GLUCOSE, CAPILLARY
Glucose-Capillary: 136 mg/dL — ABNORMAL HIGH (ref 70–99)
Glucose-Capillary: 204 mg/dL — ABNORMAL HIGH (ref 70–99)

## 2021-12-07 LAB — HIV ANTIBODY (ROUTINE TESTING W REFLEX): HIV Screen 4th Generation wRfx: NONREACTIVE

## 2021-12-07 MED ORDER — CLOPIDOGREL BISULFATE 75 MG PO TABS: 75.0000 mg | ORAL_TABLET | Freq: Every day | ORAL | 0 refills | Status: DC

## 2021-12-07 MED ORDER — ATORVASTATIN CALCIUM 80 MG PO TABS: 80.0000 mg | ORAL_TABLET | Freq: Every day | ORAL | 0 refills | Status: AC

## 2021-12-07 NOTE — Progress Notes (Signed)
*  PRELIMINARY RESULTS* Echocardiogram 2D Echocardiogram has been performed.  Cristela Blue 12/07/2021, 8:04 AM

## 2021-12-07 NOTE — Evaluation (Addendum)
Occupational Therapy Evaluation Patient Details Name: [name] MRN: [id number] DOB: January 18, 1956 Today's Date: 12/07/2021   History of Present Illness Patient is a 66 y.o. male with medical history significant for DMT2, HLD, gout who presents for evaluation of numbness and weakness of his left side of his body with slurred speech. 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   Clinical Impression   Pt seen for OT evaluation this date. Prior to hospital admission, pt was independent in all aspects of ADL/IADL and driving. Pt recently moved from [place] to [place] in November 2022 and reports having family in the area (nephew lives next door) that can assist PRN. Pt lives with his 2 dogs. Currently pt demonstrates impairments in mild L sided strength and coordination, and balance impairing his ability to perform mobility and ADL tasks at baseline independence., currently requiring modified independence. No visual, cognitive, or overt sensory deficits appreciated. Pt instructed in [facility] ex/activities for LUE. Pt verbalizes understanding. Do not anticipate skilled OT needs upon discharge. TOC notified.    Recommendations for follow up therapy are one component of a multi-disciplinary discharge planning process, led by the attending physician.  Recommendations may be updated based on patient status, additional functional criteria and insurance authorization.   Follow Up Recommendations  No OT follow up    Assistance Recommended at Discharge PRN  Patient can return home with the following Assist for transportation (~1 week per MD)    Functional Status Assessment  Patient has had a recent decline in their functional status and demonstrates the ability to make significant improvements in function in a reasonable and predictable amount of time.  Equipment Recommendations  None recommended by OT    Recommendations for Other Services       Precautions / Restrictions  Precautions Precautions: Fall Restrictions Weight Bearing Restrictions: No      Mobility Bed Mobility Overal bed mobility: Modified Independent                  Transfers Overall transfer level: Modified independent                        Balance Overall balance assessment: Needs assistance, History of Falls Sitting-balance support: Feet supported Sitting balance-Leahy Scale: Good     Standing balance support: No upper extremity supported, During functional activity Standing balance-Leahy Scale: Fair                             ADL either performed or assessed with clinical judgement   ADL Overall ADL's : Modified independent                                             Vision         Perception     Praxis      Pertinent Vitals/Pain Pain Assessment Pain Assessment: No/denies pain     Hand Dominance Right   Extremity/Trunk Assessment Upper Extremity Assessment Upper Extremity Assessment: LUE deficits/detail RUE Deficits / Details: grossly 5/5 shoulder flexion, elbow flexion, extension, good grip strength RUE Sensation: WNL RUE Coordination: WNL LUE Deficits / Details: grossly 5/5, elbow flexion, extension, good grip strength; 4+/5 shoulder flexion, very mild pronator drift noted, very mild FMC with finger to nose, thumb opposition testing LUE Sensation: WNL LUE  Coordination: decreased fine motor;decreased gross motor   Lower Extremity Assessment Lower Extremity Assessment: LLE deficits/detail RLE Deficits / Details: 5/5 throughout hip add/abd, knee extension, hip flexion, dorsiflexion/plantarflexion RLE Sensation: history of peripheral neuropathy;decreased light touch RLE Coordination: WNL LLE Deficits / Details: 4+/5 hip flexion otherwise 5/5, hx of bilat foot neuropathy LLE Sensation: decreased light touch;history of peripheral neuropathy;decreased proprioception (impaired from mid tibia distally (chronic per  patient report), midly worse than baseline. also impaired great toe proprioception) LLE Coordination: decreased gross motor;decreased fine motor       Communication Communication Communication: No difficulties   Cognition Arousal/Alertness: Awake/alert Behavior During Therapy: WFL for tasks assessed/performed Overall Cognitive Status: Within Functional Limits for tasks assessed                                       General Comments       Exercises Other Exercises Other Exercises: Pt educated in LUE strngth/FMC exercises and activities to perform   Shoulder Instructions      Home Living Family/patient expects to be discharged to:: Private residence Living Arrangements: Alone Available Help at Discharge: Family Type of Home: House Home Access: Stairs to enter Secretary/administrator of Steps: 3 Entrance Stairs-Rails: Can reach both Home Layout: One level     Bathroom Shower/Tub: Tub/shower unit         Home Equipment: Grab bars - toilet;Grab bars - tub/shower          Prior Functioning/Environment Prior Level of Function : Independent/Modified Independent;History of Falls (last six months)             Mobility Comments: independent; reports a fall prior to coming to the hospital due to left leg deficts from acute medical issues ADLs Comments: independent        OT Problem List: Decreased coordination;Impaired balance (sitting and/or standing)      OT Treatment/Interventions: Neuromuscular education;Patient/family education;Therapeutic activities;Therapeutic exercise    OT Goals(Current goals can be found in the care plan section) Acute Rehab OT Goals Patient Stated Goal: go home OT Goal Formulation: With patient Time For Goal Achievement: 12/21/21 Potential to Achieve Goals: Good ADL Goals Pt/caregiver will Perform Home Exercise Program: With written HEP provided;Left upper extremity;Independently [facility])  OT Frequency: Min 2X/week     Co-evaluation              AM-PAC OT "6 Clicks" Daily Activity     Outcome Measure Help from another person eating meals?: None Help from another person taking care of personal grooming?: None Help from another person toileting, which includes using toliet, bedpan, or urinal?: None Help from another person bathing (including washing, rinsing, drying)?: None Help from another person to put on and taking off regular upper body clothing?: None Help from another person to put on and taking off regular lower body clothing?: None 6 Click Score: 24   End of Session    Activity Tolerance: Patient tolerated treatment well Patient left: in bed;with call bell/phone within reach  OT Visit Diagnosis: Other abnormalities of gait and mobility (R26.89);Hemiplegia and hemiparesis Hemiplegia - Right/Left: Left Hemiplegia - dominant/non-dominant: Non-Dominant Hemiplegia - caused by: Cerebral infarction                Time: 1856-3149 OT Time Calculation (min): 22 min Charges:  OT General Charges $OT Visit: 1 Visit OT Evaluation $OT Eval Low Complexity: 1 Low OT Treatments $Neuromuscular Re-education: 8-22  mins  Arman Filter., MPH, MS, OTR/L ascom [phone] 12/07/21, 12:02 PM

## 2021-12-07 NOTE — Progress Notes (Signed)
Neurology Progress Note   S:// Feels better this AM. Left arm and leg numbness improved and so did strength overnight.   O:// Current vital signs: BP 116/72 (BP Location: Right Arm)    Pulse 67    Temp 98.4 F (36.9 C) (Oral)    Resp 18    Ht 6\' 2"  (1.88 m)    Wt 104.3 kg    SpO2 99%    BMI 29.53 kg/m  Vital signs in last 24 hours: Temp:  [97.1 F (36.2 C)-98.8 F (37.1 C)] 98.4 F (36.9 C) (02/07 0754) Pulse Rate:  [67-80] 67 (02/07 0754) Resp:  [16-23] 18 (02/07 0754) BP: (102-125)/(65-78) 116/72 (02/07 0754) SpO2:  [94 %-100 %] 99 % (02/07 0754) Weight:  [104.3 kg] 104.3 kg (02/06 1215)  General: Awake alert in no distress HEENT: Normocephalic atraumatic CVS: Regular rhythm Respiratory: Breathing well saturating normally on room air.  No Rales Abdomen nondistended nontender Extremities warm well perfused Neurological exam Awake alert oriented x3 Very mild dysarthria No evidence of aphasia Cranial nerves II to XII: Mild left nasolabial fold flattening, otherwise unremarkable. Motor examination with essentially no drift today but mild weakness of the left upper and lower extremity on individual muscle testing. Sensory examination with intact sensation bilaterally. Coordination: With mild dysmetria in the left finger-nose-finger testing. Gait testing deferred at this time.  Medications  Current Facility-Administered Medications:    acetaminophen (TYLENOL) tablet 650 mg, 650 mg, Oral, Q4H PRN **OR** acetaminophen (TYLENOL) 160 MG/5ML solution 650 mg, 650 mg, Per Tube, Q4H PRN **OR** acetaminophen (TYLENOL) suppository 650 mg, 650 mg, Rectal, Q4H PRN, Chotiner, Yevonne Aline, MD   aspirin EC tablet 81 mg, 81 mg, Oral, Daily, Amie Portland, MD, 81 mg at 12/07/21 0841   buPROPion (WELLBUTRIN XL) 24 hr tablet 150 mg, 150 mg, Oral, Daily, Chotiner, Yevonne Aline, MD, 150 mg at 12/07/21 0841   clopidogrel (PLAVIX) tablet 75 mg, 75 mg, Oral, Daily, Amie Portland, MD, 75 mg at 12/07/21  0841   gabapentin (NEURONTIN) capsule 600 mg, 600 mg, Oral, TID, Chotiner, Yevonne Aline, MD, 600 mg at 12/07/21 0518   insulin aspart (novoLOG) injection 0-9 Units, 0-9 Units, Subcutaneous, TID WC & HS, Chotiner, Yevonne Aline, MD, 3 Units at 12/07/21 0841   lactated ringers infusion, , Intravenous, Continuous, Chotiner, Yevonne Aline, MD, Stopped at 12/07/21 0713   lisinopril (ZESTRIL) tablet 5 mg, 5 mg, Oral, Daily, Chotiner, Yevonne Aline, MD, 5 mg at 12/07/21 0841   rosuvastatin (CRESTOR) tablet 40 mg, 40 mg, Oral, QPM, Chotiner, Yevonne Aline, MD, 40 mg at 12/06/21 1843   senna-docusate (Senokot-S) tablet 1 tablet, 1 tablet, Oral, QHS PRN, Chotiner, Yevonne Aline, MD   traZODone (DESYREL) tablet 50 mg, 50 mg, Oral, QHS PRN, Chotiner, Yevonne Aline, MD Labs CBC    Component Value Date/Time   WBC 8.7 12/06/2021 1136   RBC 4.01 (L) 12/06/2021 1136   HGB 11.8 (L) 12/06/2021 1136   HCT 37.2 (L) 12/06/2021 1136   PLT 349 12/06/2021 1136   MCV 92.8 12/06/2021 1136   MCH 29.4 12/06/2021 1136   MCHC 31.7 12/06/2021 1136   RDW 15.4 12/06/2021 1136   LYMPHSABS 1.9 12/06/2021 1136   MONOABS 0.6 12/06/2021 1136   EOSABS 0.1 12/06/2021 1136   BASOSABS 0.1 12/06/2021 1136    CMP     Component Value Date/Time   NA 132 (L) 12/06/2021 1136   K 4.5 12/06/2021 1136   CL 97 (L) 12/06/2021 1136   CO2 27 12/06/2021 1136  GLUCOSE 150 (H) 12/06/2021 1136   BUN 37 (H) 12/06/2021 1136   CREATININE 2.25 (H) 12/06/2021 1136   CALCIUM 8.9 12/06/2021 1136   PROT 7.7 12/06/2021 1136   ALBUMIN 3.3 (L) 12/06/2021 1136   AST 15 12/06/2021 1136   ALT 12 12/06/2021 1136   ALKPHOS 69 12/06/2021 1136   BILITOT 0.6 12/06/2021 1136   GFRNONAA 31 (L) 12/06/2021 1136    A1c-6.7  Lipid Panel     Component Value Date/Time   CHOL 130 12/06/2021 1136   TRIG 84 12/06/2021 1136   HDL 27 (L) 12/06/2021 1136   CHOLHDL 4.8 12/06/2021 1136   VLDL 17 12/06/2021 1136   LDLCALC 86 12/06/2021 1136   Echocardiogram  Left ventricular  ejection fraction 50 to 55%-low normal.  Moderate left ventricular hypertrophy.  Grade 2 diastolic dysfunction.  Aortic valve not well visualized.  Left atrium size normal, interatrial septum was not well visualized.  Imaging I have reviewed images in epic and the results pertinent to this consultation are: MR brain with acute right thalamic capsular infarct. CT angiography head and neck with no emergent LVO.  Mild chronic microvascular changes seen on CT head.  Age-indeterminate right thalamic capsular infarct which has been confirmed to be acute/subacute on the MRI.  Assessment: 66 year old man who went to bed the night prior to presentation and woke up with left-sided numbness weakness, left facial droop and slurred speech consistent with a lacunar infarction in the right hemisphere, on the MRI of the brain noted to have an acute/subacute right thalamic capsular infarct. He was outside the window for IV thrombolysis and his exam was not consistent with large vessel occlusion, neither was vessel imaging and hence not a candidate for endovascular thrombectomy. Risk factor work-up reveals a A1c of 6.7, LDL of 86, 2D echocardiogram-with low normal ejection fraction, otherwise unremarkable.  Impression: -Acute ischemic stroke-likely small vessel etiology due to risk factors of diabetes and hyperlipidemia -Diabetes -Hyperlipidemia  Recommendations: -Continue telemetry as well as frequent neurochecks while in the hospital. -PT OT-follow recommendations. -Continue aspirin 81 mg and Plavix 75 daily for 3 weeks followed by aspirin only. -High intensity statin-atorvastatin 80 for goal LDL of less than 70. -Follow-up with stroke neurology clinic in 4 to 6 weeks. -Follow-up with primary care in 2 to 4 weeks-we will defer formal cardiology consultation to primary care given the low normal EF. -Plan relayed to the hospitalist. Inpatient neurology will be available as needed.  Please call with  questions.  -- Amie Portland, MD Neurologist [facility] Pager: [phone]

## 2021-12-07 NOTE — Care Management CC44 (Signed)
Condition Code 44 Documentation Completed  Patient Details  Name: [name] MRN: [id number] Date of Birth: Apr 28, 1956   Condition Code 44 given:   Yes Patient signature on Condition Code 44 notice:   Yes Documentation of 2 MD's agreement:  Yes  Code 44 added to claim:  Yes     Caryn Section, RN 12/07/2021, 12:06 PM

## 2021-12-07 NOTE — TOC Initial Note (Signed)
Transition of Care [facility]) - Initial/Assessment Note    Patient Details  Name: [name] MRN: [id number] Date of Birth: Apr 09, 1956  [facility]) CM/SW Contact:    Caryn Section, RN Phone Number: 12/07/2021, 10:22 AM  Clinical Narrative:      Patient lives at home alone, nephew lives next door and sisters nearby.  He states his family can assist if needed.  Patient is up to date with Manson Allan, NP as his PCP and all other appointments, no concerns at this time.  Patient prefers outpatient PT, PT has recommended this, request sent.  Patient gets meds delivered by [facility] and fills his pillbox every Sunday.He states he is compliant with his medications and has no concerns.  Patient has no concerns about returning home at discharge.              Expected Discharge Plan: Home/Self Care (Outpatient PT) Barriers to Discharge: Continued Medical Work up   Patient Goals and CMS Choice     Choice offered to / list presented to : NA  Expected Discharge Plan and Services Expected Discharge Plan: Home/Self Care (Outpatient PT)   Discharge Planning Services: CM Consult Post Acute Care Choice:  (outpatient pt) Living arrangements for the past 2 months: Single Family Home                 DME Arranged: Walker rolling DME Agency: [employer] Date DME Agency Contacted: 12/07/21 Time DME Agency Contacted: 1020 Representative spoke with at DME Agency: danielle            Prior Living Arrangements/Services Living arrangements for the past 2 months: Single Family Home Lives with:: Self Patient language and need for interpreter reviewed:: Yes (No interpreter required) Do you feel safe going back to the place where you live?: Yes      Need for Family Participation in Patient Care: Yes (Comment) Care giver support system in place?: Yes (comment)   Criminal Activity/Legal Involvement Pertinent to Current Situation/Hospitalization: No - Comment as  needed  Activities of Daily Living Home Assistive Devices/Equipment: None ADL Screening (condition at time of admission) Patient's cognitive ability adequate to safely complete daily activities?: Yes Is the patient deaf or have difficulty hearing?: No Does the patient have difficulty seeing, even when wearing glasses/contacts?: No Does the patient have difficulty concentrating, remembering, or making decisions?: No Patient able to express need for assistance with ADLs?: Yes Does the patient have difficulty dressing or bathing?: No Independently performs ADLs?: Yes (appropriate for developmental age) Does the patient have difficulty walking or climbing stairs?: No Weakness of Legs: Left Weakness of Arms/Hands: Left  Permission Sought/Granted Permission sought to share information with : Case Manager Permission granted to share information with : Yes, Verbal Permission Granted     Permission granted to share info w AGENCY: Outpatient PT        Emotional Assessment Appearance:: Appears stated age Attitude/Demeanor/Rapport: Engaged, Gracious Affect (typically observed): Pleasant, Appropriate Orientation: : Oriented to Self, Oriented to Place, Oriented to  Time, Oriented to Situation Alcohol / Substance Use: Not Applicable Psych Involvement: No (comment)  Admission diagnosis:  CVA (cerebral vascular accident) (HCC) [I63.9] Left-sided weakness [R53.1] Patient Active Problem List   Diagnosis Date Noted   CVA (cerebral vascular accident) (HCC) 12/06/2021   Diabetes mellitus type 2, uncomplicated (HCC) 12/06/2021   AKI (acute kidney injury) (HCC) 12/06/2021   Hyponatremia 12/06/2021   Dysarthria 12/06/2021   PCP:  Alm Bustard, NP Pharmacy:  No  Pharmacies Listed    Social Determinants of Health (SDOH) Interventions    Readmission Risk Interventions No flowsheet data found.

## 2021-12-07 NOTE — Discharge Summary (Addendum)
Physician Discharge Summary   [name]  male DOB: Nov 16, 1955  [id number]  PCP: Peggye Form, NP  Admit date: 12/06/2021 Discharge date: 12/07/2021  Admitted From: home Disposition:  home CODE STATUS: DNR  Discharge Instructions     Discharge instructions   Complete by: As directed    You have had a stroke.  Neurology recommends taking ASA 81 mg and Plavix 75 mg daily for 21 days together, then after that, just ASA 81 mg daily alone.  Also we have changed your cholesterol medication to Lipitor 80 mg daily.   Dr. Enzo Bi - -   Discharge wound care:   Complete by: As directed    Wound care  Every other day    Comments: Clean right plantar first metatarsal head wound and left great toe wound with saline, pat dry.  Cut to fit small piece of silver hydrofiber (Aquacel Ag+) Lawson # [id number] and place over each wound. Top with foam dressing.  [facility] Course:  For full details, please see H&P, progress notes, consult notes and ancillary notes.  Briefly,  [name] is a 66 y.o. male with medical history significant for DMT2, HLD, gout who presented for evaluation of numbness and weakness of his left side of his body with slurred speech.  He reported he went to bed around 10 PM and was in his normal state of health with no symptoms.  He woke up around 730 the morning of presentation and noticed that his left arm and left leg were numb.  He also had numbness to the left side of his face.  When he spoke he felt like his speech was muffled and garbled which was not normal for him.    CVA (cerebral vascular accident) --Neuro consulted.  MR brain with acute right thalamic capsular infarct.  Acute ischemic stroke-likely small vessel etiology due to risk factors of diabetes and hyperlipidemia.   CT angiography head and neck with no emergent LVO.  Risk factor work-up reveals a A1c of 6.7, LDL of 86, 2D echocardiogram-with low normal ejection fraction, otherwise  unremarkable. -Continue aspirin 81 mg and Plavix 75 daily for 3 weeks followed by aspirin only. -High intensity statin-atorvastatin 80 for goal LDL of less than 70. -Follow-up with stroke neurology clinic in 4 to 6 weeks.    Diabetes mellitus type 2, uncomplicated, well controlled --A1c 6.7 --resume home metformin and Semaglutide after discharge.     AKI (acute kidney injury), improved --Unclear etiology.  Cr 2.25 on presentation, improved to 1.64 next day with IV hydration.  Expect return to baseline of around 1.2 with continued oral hydration.     Dysarthria Mild, resolved.     Hyponatremia Very mild.  Na 132, unchanged with IVF.  Unclear significance.    Left great toe and right plantar first metatarsal head, POA --wound care RN consulted, wound care per order. --Pt has been followed by [facility], and will continue.   Discharge Diagnoses:  Principal Problem:   CVA (cerebral vascular accident) ([place]) Active Problems:   Diabetes mellitus type 2, uncomplicated ([place])   AKI (acute kidney injury) ([place])   Hyponatremia   Dysarthria   30 Day Unplanned Readmission Risk Score    Flowsheet Row ED to Hosp-Admission (Current) from 12/06/2021 in [place] (1C)  30 Day Unplanned Readmission Risk Score (%) 15.11 Filed at 12/07/2021 0801       This score is the patient's risk  of an unplanned readmission within 30 days of being discharged (0 -100%). The score is based on dignosis, age, lab data, medications, orders, and past utilization.   Low:  0-14.9   Medium: 15-21.9   High: 22-29.9   Extreme: 30 and above         Discharge Instructions:  Allergies as of 12/07/2021   No Known Allergies      Medication List     STOP taking these medications    rosuvastatin 40 MG tablet Commonly known as: CRESTOR       TAKE these medications    allopurinol 300 MG tablet Commonly known as: ZYLOPRIM Take 300 mg by mouth daily.   aspirin 81 MG chewable  tablet Chew 81 mg by mouth daily.   atorvastatin 80 MG tablet Commonly known as: Lipitor Take 1 tablet (80 mg total) by mouth daily.   buPROPion 150 MG 24 hr tablet Commonly known as: WELLBUTRIN XL Take 150 mg by mouth daily.   clopidogrel 75 MG tablet Commonly known as: PLAVIX Take 1 tablet (75 mg total) by mouth daily for 21 days. Start taking on: December 08, 2021   empagliflozin 25 MG Tabs tablet Commonly known as: JARDIANCE Take 25 mg by mouth daily.   gabapentin 300 MG capsule Commonly known as: NEURONTIN Take 600 mg by mouth 3 (three) times daily.   lisinopril 10 MG tablet Commonly known as: ZESTRIL Take 5 mg by mouth daily.   magnesium oxide 400 MG tablet Commonly known as: MAG-OX Take 400 mg by mouth daily.   metFORMIN 500 MG tablet Commonly known as: GLUCOPHAGE Take 1,000 mg by mouth 2 (two) times daily with a meal.   QUEtiapine 25 MG tablet Commonly known as: SEROQUEL Take 25 mg by mouth at bedtime.   rOPINIRole 0.5 MG tablet Commonly known as: REQUIP Take 0.5 mg by mouth 3 (three) times daily.   Semaglutide 7 MG Tabs Take 7 mg by mouth daily.   vitamin B-12 1000 MCG tablet Commonly known as: CYANOCOBALAMIN Take 1,000 mcg by mouth daily.   Vitamin D2 50 MCG (2000 UT) Tabs Take 2,000 Units by mouth daily.               Durable Medical Equipment  (From admission, onward)           Start     Ordered   12/07/21 1019  For home use only DME Walker rolling  Once       Question Answer Comment  Walker: With 5 Inch Wheels   Patient needs a walker to treat with the following condition Impaired ambulation      12/07/21 1018              Discharge Care Instructions  (From admission, onward)           Start     Ordered   12/07/21 0000  Discharge wound care:       Comments: Wound care  Every other day    Comments: Clean right plantar first metatarsal head wound and left great toe wound with saline, pat dry.  Cut to fit small  piece of silver hydrofiber (Aquacel Ag+) Lawson # [id number] and place over each wound. Top with foam dressing.  - -   12/07/21 1128             Follow-up Information     Fields, Glenda L, NP Follow up in 1 week(s).   Specialty: Family Medicine Contact information: [address]  [place]  [address] [phone]                 No Known Allergies   The results of significant diagnostics from this hospitalization (including imaging, microbiology, ancillary and laboratory) are listed below for reference.   Consultations:   Procedures/Studies: MR BRAIN WO CONTRAST  Result Date: 12/06/2021 CLINICAL DATA:  Neuro deficit, acute, stroke suspected EXAM: MRI HEAD WITHOUT CONTRAST TECHNIQUE: Multiplanar, multiecho pulse sequences of the brain and surrounding structures were obtained without intravenous contrast. COMPARISON:  None. FINDINGS: Brain: Small focus of reduced diffusion is present in the right thalamocapsular region. This corresponds to abnormality questioned on prior CT. No additional foci of reduced diffusion. No evidence of intracranial hemorrhage. There is no intracranial mass, mass effect, hydrocephalus, or extra-axial collection. Prominence of the ventricles and sulci reflects parenchymal volume loss. Patchy foci of T2 hyperintensity in the supratentorial white matter are nonspecific but may reflect mild chronic microvascular ischemic changes. Vascular: Major vessel flow voids at the skull base are preserved. Skull and upper cervical spine: Normal marrow signal is preserved. Sinuses/Orbits: Paranasal sinuses are aerated. Orbits are unremarkable. Other: Sella is unremarkable.  Mastoid air cells are clear. IMPRESSION: Acute small vessel infarct right thalamocapsular region. Mild chronic microvascular ischemic changes. Electronically Signed   By: Macy Mis M.D.   On: 12/06/2021 15:59   ECHOCARDIOGRAM COMPLETE  Result Date: 12/07/2021    ECHOCARDIOGRAM REPORT    Patient Name:   [name] Date of Exam: 12/07/2021 Medical Rec #:  [id number]     Height:       74.0 in Accession #:    [id number]    Weight:       230.0 lb Date of Birth:  15-Feb-1956    BSA:          2.306 m Patient Age:    66 years      BP:           125/78 mmHg Patient Gender: M             HR:           77 bpm. Exam Location:  [facility] Procedure: 2D Echo, Cardiac Doppler and Color Doppler Indications:     Stroke I63.9  History:         Patient has no prior history of Echocardiogram examinations.                  Risk Factors:Diabetes.  Sonographer:     Sherrie Sport Referring Phys:  [id number] Eben Burow Diagnosing Phys: Harrell Gave End MD  Sonographer Comments: Suboptimal parasternal window and Technically challenging study due to limited acoustic windows. IMPRESSIONS  1. Left ventricular ejection fraction, by estimation, is 50 to 55%. The left ventricle has low normal function. Left ventricular endocardial border not optimally defined to evaluate regional wall motion. There is moderate left ventricular hypertrophy. Left ventricular diastolic parameters are consistent with Grade II diastolic dysfunction (pseudonormalization).  2. Right ventricular systolic function is normal. The right ventricular size is normal.  3. The mitral valve is abnormal. Trivial mitral valve regurgitation. No evidence of mitral stenosis.  4. The aortic valve was not well visualized. There is mild thickening of the aortic valve. Aortic valve regurgitation is not visualized. Aortic valve sclerosis is present, with no evidence of aortic valve stenosis. FINDINGS  Left Ventricle: Left ventricular ejection fraction, by estimation, is 50 to 55%. The left ventricle has low normal function. Left ventricular endocardial border not optimally defined  to evaluate regional wall motion. The left ventricular internal cavity  size was normal in size. There is moderate left ventricular hypertrophy. Left ventricular diastolic parameters are consistent  with Grade II diastolic dysfunction (pseudonormalization). Right Ventricle: The right ventricular size is normal. Right vetricular wall thickness was not well visualized. Right ventricular systolic function is normal. Left Atrium: Left atrial size was normal in size. Right Atrium: Right atrial size was normal in size. Pericardium: The pericardium was not well visualized. Mitral Valve: The mitral valve is abnormal. Mild mitral annular calcification. Trivial mitral valve regurgitation. No evidence of mitral valve stenosis. MV peak gradient, 2.4 mmHg. The mean mitral valve gradient is 1.0 mmHg. Tricuspid Valve: The tricuspid valve is grossly normal. Tricuspid valve regurgitation is trivial. Aortic Valve: The aortic valve was not well visualized. There is mild thickening of the aortic valve. Aortic valve regurgitation is not visualized. Aortic valve sclerosis is present, with no evidence of aortic valve stenosis. Aortic valve mean gradient measures 2.5 mmHg. Aortic valve peak gradient measures 4.2 mmHg. Aortic valve area, by VTI measures 2.37 cm. Pulmonic Valve: The pulmonic valve was not well visualized. Aorta: The aortic root is normal in size and structure. Pulmonary Artery: The pulmonary artery is not well seen. Venous: The inferior vena cava was not well visualized. IAS/Shunts: The interatrial septum was not well visualized.  LEFT VENTRICLE PLAX 2D LVIDd:         3.70 cm   Diastology LVIDs:         2.60 cm   LV e' medial:    6.96 cm/s LV PW:         1.00 cm   LV E/e' medial:  9.5 LV IVS:        1.63 cm   LV e' lateral:   13.70 cm/s LVOT diam:     2.10 cm   LV E/e' lateral: 4.8 LV SV:         46 LV SV Index:   20 LVOT Area:     3.46 cm  RIGHT VENTRICLE RV Basal diam:  3.30 cm RV S prime:     10.80 cm/s TAPSE (M-mode): 2.9 cm LEFT ATRIUM           Index        RIGHT ATRIUM           Index LA diam:      3.40 cm 1.47 cm/m   RA Area:     12.40 cm LA Vol (A2C): 68.0 ml 29.48 ml/m  RA Volume:   29.70 ml  12.88 ml/m  LA Vol (A4C): 28.1 ml 12.18 ml/m  AORTIC VALVE AV Area (Vmax):    2.51 cm AV Area (Vmean):   2.08 cm AV Area (VTI):     2.37 cm AV Vmax:           102.50 cm/s AV Vmean:          75.350 cm/s AV VTI:            0.193 m AV Peak Grad:      4.2 mmHg AV Mean Grad:      2.5 mmHg LVOT Vmax:         74.40 cm/s LVOT Vmean:        45.200 cm/s LVOT VTI:          0.132 m LVOT/AV VTI ratio: 0.68  AORTA Ao Root diam: 3.05 cm MITRAL VALVE  TRICUSPID VALVE MV Area (PHT): 3.08 cm    TR Peak grad:   10.8 mmHg MV Area VTI:   2.61 cm    TR Vmax:        164.00 cm/s MV Peak grad:  2.4 mmHg MV Mean grad:  1.0 mmHg    SHUNTS MV Vmax:       0.78 m/s    Systemic VTI:  0.13 m MV Vmean:      48.6 cm/s   Systemic Diam: 2.10 cm MV Decel Time: 246 msec MV E velocity: 66.00 cm/s MV A velocity: 63.00 cm/s MV E/A ratio:  1.05 Yvonne Kendallhristopher End MD Electronically signed by Yvonne Kendallhristopher End MD Signature Date/Time: 12/07/2021/10:07:38 AM    Final    CT HEAD CODE STROKE WO CONTRAST  Result Date: 12/06/2021 CLINICAL DATA:  Code stroke. Neuro deficit, acute, stroke suspected; Stroke, follow up. Left facial droop, left arm weakness EXAM: CT HEAD WITHOUT CONTRAST CT ANGIOGRAPHY OF THE HEAD AND NECK TECHNIQUE: Contiguous axial images were obtained from the base of the skull through the vertex without intravenous contrast. Multidetector CT imaging of the head and neck was performed using the standard protocol during bolus administration of intravenous contrast. Multiplanar CT image reconstructions and MIPs were obtained to evaluate the vascular anatomy. Carotid stenosis measurements (when applicable) are obtained utilizing NASCET criteria, using the distal internal carotid diameter as the denominator. RADIATION DOSE REDUCTION: This exam was performed according to the departmental dose-optimization program which includes automated exposure control, adjustment of the mA and/or kV according to patient size and/or use of iterative reconstruction  technique. CONTRAST:  75mL OMNIPAQUE IOHEXOL 350 MG/ML SOLN COMPARISON:  None. FINDINGS: CT HEAD Brain: There is no acute intracranial hemorrhage, mass effect, or edema. Gray-white differentiation is preserved. There is no extra-axial fluid collection. Prominence of the ventricles and sulci reflects mild parenchymal volume loss. Patchy low-density in the supratentorial white matter is nonspecific but may reflect mild chronic microvascular ischemic changes. Vascular: No hyperdense vessel. Skull: Calvarium is unremarkable. Sinuses/Orbits: No acute finding. Other: None. Review of the MIP images confirms the above findings CTA NECK Aortic arch: Calcified plaque along the arch and patent great vessel origins. There is noncalcified plaque along the proximal left subclavian causing about 50% stenosis. Right carotid system: Patent. Primarily calcified plaque at the bifurcation and proximal internal carotid with minimal stenosis. Left carotid system: Patent. Mixed plaque at the bifurcation and proximal internal carotid with minimal stenosis. Vertebral arteries: Patent. Right vertebral is mildly dominant. There is mild calcified plaque along the right V1 vertebral artery. No stenosis. Skeleton: Cervical spine degenerative changes. Other neck: Unremarkable. Upper chest: No apical lung mass. Review of the MIP images confirms the above findings CTA HEAD Anterior circulation: Intracranial internal carotid arteries are patent. Anterior and middle cerebral arteries are patent. Posterior circulation: Intracranial vertebral arteries are patent. Basilar artery is patent. Major cerebellar artery origins are patent. Posterior cerebral arteries are patent. Venous sinuses: Patent as allowed by contrast bolus timing. Review of the MIP images confirms the above findings IMPRESSION: There is no acute intracranial hemorrhage or evidence of acute infarction. ASPECT score is 10. Mild chronic microvascular ischemic changes. Possible superimposed  age-indeterminate small vessel infarct of the right thalamocapsular region. No large vessel occlusion, hemodynamically significant stenosis, or evidence of dissection. Preliminary results were communicated to Dr. Wilford CornerArora at 11:31 am on 12/06/2021 by text page via the [facility] messaging system. Electronically Signed   By: Guadlupe SpanishPraneil  [name] M.D.   On: 12/06/2021 11:57  CT ANGIO HEAD NECK W WO CM (CODE STROKE)  Result Date: 12/06/2021 CLINICAL DATA:  Code stroke. Neuro deficit, acute, stroke suspected; Stroke, follow up. Left facial droop, left arm weakness EXAM: CT HEAD WITHOUT CONTRAST CT ANGIOGRAPHY OF THE HEAD AND NECK TECHNIQUE: Contiguous axial images were obtained from the base of the skull through the vertex without intravenous contrast. Multidetector CT imaging of the head and neck was performed using the standard protocol during bolus administration of intravenous contrast. Multiplanar CT image reconstructions and MIPs were obtained to evaluate the vascular anatomy. Carotid stenosis measurements (when applicable) are obtained utilizing NASCET criteria, using the distal internal carotid diameter as the denominator. RADIATION DOSE REDUCTION: This exam was performed according to the departmental dose-optimization program which includes automated exposure control, adjustment of the mA and/or kV according to patient size and/or use of iterative reconstruction technique. CONTRAST:  33mL OMNIPAQUE IOHEXOL 350 MG/ML SOLN COMPARISON:  None. FINDINGS: CT HEAD Brain: There is no acute intracranial hemorrhage, mass effect, or edema. Gray-white differentiation is preserved. There is no extra-axial fluid collection. Prominence of the ventricles and sulci reflects mild parenchymal volume loss. Patchy low-density in the supratentorial white matter is nonspecific but may reflect mild chronic microvascular ischemic changes. Vascular: No hyperdense vessel. Skull: Calvarium is unremarkable. Sinuses/Orbits: No acute finding. Other:  None. Review of the MIP images confirms the above findings CTA NECK Aortic arch: Calcified plaque along the arch and patent great vessel origins. There is noncalcified plaque along the proximal left subclavian causing about 50% stenosis. Right carotid system: Patent. Primarily calcified plaque at the bifurcation and proximal internal carotid with minimal stenosis. Left carotid system: Patent. Mixed plaque at the bifurcation and proximal internal carotid with minimal stenosis. Vertebral arteries: Patent. Right vertebral is mildly dominant. There is mild calcified plaque along the right V1 vertebral artery. No stenosis. Skeleton: Cervical spine degenerative changes. Other neck: Unremarkable. Upper chest: No apical lung mass. Review of the MIP images confirms the above findings CTA HEAD Anterior circulation: Intracranial internal carotid arteries are patent. Anterior and middle cerebral arteries are patent. Posterior circulation: Intracranial vertebral arteries are patent. Basilar artery is patent. Major cerebellar artery origins are patent. Posterior cerebral arteries are patent. Venous sinuses: Patent as allowed by contrast bolus timing. Review of the MIP images confirms the above findings IMPRESSION: There is no acute intracranial hemorrhage or evidence of acute infarction. ASPECT score is 10. Mild chronic microvascular ischemic changes. Possible superimposed age-indeterminate small vessel infarct of the right thalamocapsular region. No large vessel occlusion, hemodynamically significant stenosis, or evidence of dissection. Preliminary results were communicated to Dr. Rory Percy at 11:31 am on 12/06/2021 by text page via the [facility] messaging system. Electronically Signed   By: Macy Mis M.D.   On: 12/06/2021 11:57      Labs: BNP (last 3 results) No results for input(s): BNP in the last 8760 hours. Basic Metabolic Panel: Recent Labs  Lab 12/06/21 1136 12/07/21 1019  NA 132* 132*  K 4.5 4.7  CL 97* 99  CO2  27 25  GLUCOSE 150* 176*  BUN 37* 31*  CREATININE 2.25* 1.64*  CALCIUM 8.9 9.4  MG  --  2.1   Liver Function Tests: Recent Labs  Lab 12/06/21 1136  AST 15  ALT 12  ALKPHOS 69  BILITOT 0.6  PROT 7.7  ALBUMIN 3.3*   No results for input(s): LIPASE, AMYLASE in the last 168 hours. No results for input(s): AMMONIA in the last 168 hours. CBC: Recent Labs  Lab  12/06/21 1136 12/07/21 1019  WBC 8.7 7.1  NEUTROABS 6.0  --   HGB 11.8* 12.3*  HCT 37.2* 37.9*  MCV 92.8 92.9  PLT 349 387   Cardiac Enzymes: No results for input(s): CKTOTAL, CKMB, CKMBINDEX, TROPONINI in the last 168 hours. BNP: Invalid input(s): POCBNP CBG: Recent Labs  Lab 12/03/21 1246 12/06/21 1632 12/06/21 2043 12/07/21 0755  GLUCAP 105* 109* 191* 204*   D-Dimer No results for input(s): DDIMER in the last 72 hours. Hgb A1c Recent Labs    12/06/21 1136  HGBA1C 6.7*   Lipid Profile Recent Labs    12/06/21 1136  CHOL 130  HDL 27*  LDLCALC 86  TRIG 84  CHOLHDL 4.8   Thyroid function studies No results for input(s): TSH, T4TOTAL, T3FREE, THYROIDAB in the last 72 hours.  Invalid input(s): FREET3 Anemia work up No results for input(s): VITAMINB12, FOLATE, FERRITIN, TIBC, IRON, RETICCTPCT in the last 72 hours. Urinalysis No results found for: COLORURINE, APPEARANCEUR, [place], [place], GLUCOSEU, [place], [place], [place], PROTEINUR, UROBILINOGEN, NITRITE, LEUKOCYTESUR Sepsis Labs Invalid input(s): PROCALCITONIN,  WBC,  LACTICIDVEN Microbiology Recent Results (from the past 240 hour(s))  Resp Panel by RT-PCR (Flu A&B, Covid) Nasopharyngeal Swab     Status: None   Collection Time: 12/06/21 11:54 AM   Specimen: Nasopharyngeal Swab; Nasopharyngeal(NP) swabs in vial transport medium  Result Value Ref Range Status   SARS Coronavirus 2 by RT PCR NEGATIVE NEGATIVE Final    Comment: (NOTE) SARS-CoV-2 target nucleic acids are NOT DETECTED.  The SARS-CoV-2 RNA is generally detectable in upper  respiratory specimens during the acute phase of infection. The lowest concentration of SARS-CoV-2 viral copies this assay can detect is 138 copies/mL. A negative result does not preclude SARS-Cov-2 infection and should not be used as the sole basis for treatment or other patient management decisions. A negative result may occur with  improper specimen collection/handling, submission of specimen other than nasopharyngeal swab, presence of viral mutation(s) within the areas targeted by this assay, and inadequate number of viral copies(<138 copies/mL). A negative result must be combined with clinical observations, patient history, and epidemiological information. The expected result is Negative.  Fact Sheet for Patients:  EntrepreneurPulse.com.au  Fact Sheet for Healthcare Providers:  IncredibleEmployment.be  This test is no t yet approved or cleared by the [place] FDA and  has been authorized for detection and/or diagnosis of SARS-CoV-2 by FDA under an Emergency Use Authorization (EUA). This EUA will remain  in effect (meaning this test can be used) for the duration of the COVID-19 declaration under Section 564(b)(1) of the Act, 21 U.S.C.section 360bbb-3(b)(1), unless the authorization is terminated  or revoked sooner.       Influenza A by PCR NEGATIVE NEGATIVE Final   Influenza B by PCR NEGATIVE NEGATIVE Final    Comment: (NOTE) The Xpert Xpress SARS-CoV-2/FLU/RSV plus assay is intended as an aid in the diagnosis of influenza from Nasopharyngeal swab specimens and should not be used as a sole basis for treatment. Nasal washings and aspirates are unacceptable for Xpert Xpress SARS-CoV-2/FLU/RSV testing.  Fact Sheet for Patients: EntrepreneurPulse.com.au  Fact Sheet for Healthcare Providers: IncredibleEmployment.be  This test is not yet approved or cleared by the [place] FDA and has been  authorized for detection and/or diagnosis of SARS-CoV-2 by FDA under an Emergency Use Authorization (EUA). This EUA will remain in effect (meaning this test can be used) for the duration of the COVID-19 declaration under Section 564(b)(1) of the Act, 21 U.S.C. section 360bbb-3(b)(1), unless  the authorization is terminated or revoked.  Performed at [facility], [address] [place], [place] [address]      Total time spend on discharging this patient, including the last patient exam, discussing the hospital stay, instructions for ongoing care as it relates to all pertinent caregivers, as well as preparing the medical discharge records, prescriptions, and/or referrals as applicable, is 40 minutes.    Enzo Bi, MD  [facility] 12/07/2021, 11:28 AM

## 2021-12-07 NOTE — Progress Notes (Signed)
Occupational Therapy * Physical Therapy * Speech Therapy          DATE07 February 2023 PATIENT NAME [name] PATIENT MRN [id number]  DIAGNOSIS/DIAGNOSIS CODE _ CVA (cerebral vascular accident) [facility]) I63.9   DATE OF DISCHARGE: 07 Dec 2021  PRIMARY CARE PHYSICIAN [place], [place] [address] [phone]      Dear Provider (Name: __________________   Fax: ___________________________):   I certify that I have examined this patient and that occupational/physical/speech therapy is necessary on an outpatient basis.    The patient has expressed interest in completing their recommended course of therapy at your location.  Once a formal order from the patient's primary care physician has been obtained, please contact him/her to schedule an appointment for evaluation at your earliest convenience.   [  ]  Physical Therapy Evaluate and Treat          [  ]  Occupational Therapy Evaluate and Treat                                    [  ]  Speech Therapy Evaluate and Treat       The patient's primary care physician (listed above) must furnish and be responsible for a formal order such that the recommended services may be furnished while under the primary physician's care, and that the plan of care will be established and reviewed every 30 days (or more often if condition necessitates).

## 2021-12-07 NOTE — Evaluation (Signed)
Physical Therapy Evaluation Patient Details Name: [name] MRN: [id number] DOB: [birth year] Today's Date: 12/07/2021  History of Present Illness  Patient is a 66 y.o. male with medical history significant for DMT2, HLD, gout who presents for evaluation of numbness and weakness of his left side of his body with slurred speech. 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   Clinical Impression  Patient is cooperative and agreeable to PT. He reports his left side symptoms have improved since yesterday. He is normally independent with mobility but does have peripheral neuropathy.  Patient has mild strength and coordination deficits noted in LUE and LLE with impaired sensation of BLE (mildly worse than baseline per patient report).  Without rolling walker, patient has steppage gait, has occasional unsteadiness, and is reaching out for furniture and walls for support. With rolling walker, patient has improved gait pattern and required only supervision for safety. He is demonstrating high level of mobility overall, however he is not at his baseline level of mobility. Recommend outpatient PT and rolling walker for discharge. PT will continue to follow to maximize independence and safety in preparation for home discharge.       Recommendations for follow up therapy are one component of a multi-disciplinary discharge planning process, led by the attending physician.  Recommendations may be updated based on patient status, additional functional criteria and insurance authorization.  Follow Up Recommendations Outpatient PT    Assistance Recommended at Discharge PRN  Patient can return home with the following  Assist for transportation    Equipment Recommendations Rolling walker (2 wheels)  Recommendations for Other Services       Functional Status Assessment Patient has had a recent decline in their functional status and demonstrates the ability to make significant  improvements in function in a reasonable and predictable amount of time.     Precautions / Restrictions Precautions Precautions: Fall Restrictions Weight Bearing Restrictions: No      Mobility  Bed Mobility Overal bed mobility: Modified Independent                  Transfers Overall transfer level: Modified independent                      Ambulation/Gait Ambulation/Gait assistance: Min guard, Supervision Gait Distance (Feet): 80 Feet (x 2 bouts) Assistive device: Rolling walker (2 wheels), None (with and without rolling walker) Gait Pattern/deviations: Steppage Gait velocity: decreased     General Gait Details: patient ambulated without rolling walker with steppage gait intermittently, mildly unsteady, and reaching out for walls and furniture for support. occasional cues for safety needed for fall prevention. with using rolling walker, gait pattern and balance improved significantly with initial instruction for proper use of rolling walker. educated patient to use rolling walker at this time for fall prevention.  Stairs            Wheelchair Mobility    Modified Rankin (Stroke Patients Only)       Balance Overall balance assessment: Needs assistance, History of Falls Sitting-balance support: Feet supported Sitting balance-Leahy Scale: Good Sitting balance - Comments: with reaching outsdie base of support with BUE, overhead, and across midline   Standing balance support: Bilateral upper extremity supported, No upper extremity supported Standing balance-Leahy Scale: Fair Standing balance comment: recommend to use rolling walker for support in standing. mild unsteadiness without UE support  Pertinent Vitals/Pain Pain Assessment Pain Assessment: No/denies pain    Home Living Family/patient expects to be discharged to:: Private residence Living Arrangements: Alone Available Help at Discharge: Family Type  of Home: House Home Access: Stairs to enter Entrance Stairs-Rails: Can reach both Entrance Stairs-Number of Steps: 3   Home Layout: One level Home Equipment: Grab bars - toilet;Grab bars - tub/shower      Prior Function Prior Level of Function : Independent/Modified Independent;History of Falls (last six months)             Mobility Comments: independent; reports a fall prior to coming to the hospital due to left leg deficts from acute medical issues ADLs Comments: independent     Hand Dominance   Dominant Hand: Right    Extremity/Trunk Assessment   Upper Extremity Assessment Upper Extremity Assessment: RUE deficits/detail;LUE deficits/detail RUE Deficits / Details: grossly 5/5 shoulder flexion, elbow flexion, extension, good grip strength RUE Sensation: WNL RUE Coordination: WNL LUE Deficits / Details: grossly 5/5, elbow flexion, extension, good grip strength; 4+/5 shoulder flexion LUE Sensation: WNL LUE Coordination: decreased fine motor;decreased gross motor (dysmetria with finger to nose)    Lower Extremity Assessment Lower Extremity Assessment: RLE deficits/detail;LLE deficits/detail RLE Deficits / Details: 5/5 throughout hip add/abd, knee extension, hip flexion, dorsiflexion/plantarflexion RLE Sensation: history of peripheral neuropathy;decreased light touch RLE Coordination: WNL LLE Deficits / Details: 5/5 throughout hip add/abd, 5/5 knee extension, 4+/5 hip flexion, 5/5 dorsiflexion/plantarflexion LLE Sensation: decreased light touch;history of peripheral neuropathy;decreased proprioception (impaired from mid tibia distally (chronic per patient report), midly worse than baseline. also impaired great toe proprioception) LLE Coordination: decreased gross motor;decreased fine motor (alternating foot tapping and heel to shin without dysmetria, however patient does have decreased coordination with foot placement with ambulation without assistive device.)        Communication   Communication: No difficulties  Cognition Arousal/Alertness: Awake/alert Behavior During Therapy: WFL for tasks assessed/performed Overall Cognitive Status: Within Functional Limits for tasks assessed                                 General Comments: patient is oriented x 4 and able to follow all commands without difficulty. mildly impulsive with mobility tasks with standing/walking        General Comments      Exercises     Assessment/Plan    PT Assessment Patient needs continued PT services  PT Problem List Decreased strength;Decreased activity tolerance;Decreased balance;Decreased mobility;Decreased safety awareness;Decreased knowledge of precautions       PT Treatment Interventions Gait training;DME instruction;Stair training;Functional mobility training;Therapeutic activities;Therapeutic exercise;Balance training;Neuromuscular re-education    PT Goals (Current goals can be found in the Care Plan section)  Acute Rehab PT Goals Patient Stated Goal: to return home PT Goal Formulation: With patient Time For Goal Achievement: 12/21/21 Potential to Achieve Goals: Good    Frequency Min 2X/week     Co-evaluation               AM-PAC PT "6 Clicks" Mobility  Outcome Measure Help needed turning from your back to your side while in a flat bed without using bedrails?: None Help needed moving from lying on your back to sitting on the side of a flat bed without using bedrails?: None Help needed moving to and from a bed to a chair (including a wheelchair)?: A Little Help needed standing up from a chair using your arms (e.g., wheelchair or bedside chair)?:  A Little Help needed to walk in hospital room?: A Little Help needed climbing 3-5 steps with a railing? : A Little 6 Click Score: 20    End of Session   Activity Tolerance: Patient tolerated treatment well Patient left: in bed;with call bell/phone within reach;with bed alarm set Nurse  Communication:  (updated MD on recommendation) PT Visit Diagnosis: Muscle weakness (generalized) (M62.81);Other abnormalities of gait and mobility (R26.89)    Time: [id number] PT Time Calculation (min) (ACUTE ONLY): 24 min   Charges:   PT Evaluation $PT Eval Low Complexity: 1 Low PT Treatments $Gait Training: 8-22 mins        Minna Merritts, PT, MPT  Percell Locus 12/07/2021, 11:01 AM

## 2021-12-07 NOTE — Consult Note (Signed)
[facility] Nurse wound consult note Consultation was completed by review of records, images and assistance from the bedside nurse/clinical staff.  Reason for Consult: left great toe and right plantar first metatarsal head Patient seen 12/01/21 in the [facility] [place] [facility]  Patient with history of right met head wound since 07/15/20; followed by the [facility] wound care center New onset ulceration; full thickness left great toe related to neuropathy and new prosthetic device  Wound type:full thickness neuropathic foot wounds x 2  Pressure Injury POA: NA Measurement: right foot: 1.6cm x 0.9cm x 0.3cm  left foot: 4cm x 2.5cm x 0.5cm  Wound bed: both clean, pink per [facility] notes Drainage (amount, consistency, odor) minimal Periwound: intact  Dressing procedure/placement/frequency:  Patient has been using antibiotic ointment; will utilize antimicrobial dressing instead while inpatient. Top with foam.  Change every other day  Discussed POC with patient and bedside nurse.  Re consult if needed, will not follow at this time. Thanks  Toddrick Sanna [employer], RN,CWOCN, CNS, [place] [phone])

## 2021-12-07 NOTE — Progress Notes (Signed)
Pt being discharged home, discharge instructions reviewed with pt, states understanding, pt with no complaints at discharge  °

## 2021-12-08 ENCOUNTER — Other Ambulatory Visit: Payer: Self-pay

## 2021-12-08 ENCOUNTER — Encounter (HOSPITAL_BASED_OUTPATIENT_CLINIC_OR_DEPARTMENT_OTHER): Payer: Medicare Other | Admitting: Internal Medicine

## 2021-12-08 DIAGNOSIS — L97528 Non-pressure chronic ulcer of other part of left foot with other specified severity: Secondary | ICD-10-CM | POA: Diagnosis not present

## 2021-12-08 DIAGNOSIS — E11621 Type 2 diabetes mellitus with foot ulcer: Secondary | ICD-10-CM | POA: Diagnosis not present

## 2021-12-08 DIAGNOSIS — R2689 Other abnormalities of gait and mobility: Secondary | ICD-10-CM

## 2021-12-08 DIAGNOSIS — L97512 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ed: Secondary | ICD-10-CM | POA: Diagnosis not present

## 2021-12-08 NOTE — Progress Notes (Signed)
JACQUISE, RARICK ([id number]) Visit Report for 12/08/2021 Chief Complaint Document Details Patient Name: [name], [name] Date of Service: 12/08/2021 8:30 AM Medical Record Number: [id number] Patient Account Number: [id number] Date of Birth/Sex: 1956/04/22 (65 y.o. M) Treating RN: Donnamarie Poag Primary Care Provider: [name] Other Clinician: Referring Provider: [name] Treating Provider/Extender: Yaakov Guthrie in Treatment: 4 Information Obtained from: Patient Chief Complaint Right plantar foot wound Electronic Signature(s) Signed: 12/08/2021 9:31:28 AM By: Kalman Shan DO Entered By: Kalman Shan on 12/08/2021 09:25:05 [name] ([id number]) -------------------------------------------------------------------------------- Debridement Details Patient Name: [name] Date of Service: 12/08/2021 8:30 AM Medical Record Number: [id number] Patient Account Number: [id number] Date of Birth/Sex: 03/13/1956 (65 y.o. M) Treating RN: Donnamarie Poag Primary Care Provider: [name] Other Clinician: Referring Provider: [name] Treating Provider/Extender: Yaakov Guthrie in Treatment: 4 Debridement Performed for Wound #2 Left Toe Great Assessment: Performed By: Physician Kalman Shan, MD Debridement Type: Debridement Severity of Tissue Pre Debridement: Fat layer exposed Level of Consciousness (Pre- Awake and Alert procedure): Pre-procedure Verification/Time Out Yes - 09:03 Taken: Start Time: 09:04 Pain Control: Lidocaine Total Area Debrided (L x W): 3 (cm) x 3 (cm) = 9 (cm) Tissue and other material Viable, Non-Viable, Slough, Subcutaneous, Skin: Dermis , Slough debrided: Level: Skin/Subcutaneous Tissue Debridement Description: Excisional Instrument: Curette Bleeding: Moderate Hemostasis Achieved: Silver Nitrate Response to Treatment: Procedure was tolerated well Level of Consciousness (Post- Awake and Alert procedure): Post  Debridement Measurements of Total Wound Length: (cm) 2.5 Width: (cm) 2.5 Depth: (cm) 0.5 Volume: (cm) 2.454 Character of Wound/Ulcer Post Debridement: Improved Severity of Tissue Post Debridement: Fat layer exposed Post Procedure Diagnosis Same as Pre-procedure Notes 2 sticks silver nitrate used for hypergranulation Electronic Signature(s) Signed: 12/08/2021 9:31:28 AM By: Kalman Shan DO Signed: 12/08/2021 4:26:12 PM By: Donnamarie Poag Entered By: Donnamarie Poag on 12/08/2021 09:10:16 [name] ([id number]) -------------------------------------------------------------------------------- HPI Details Patient Name: [name] Date of Service: 12/08/2021 8:30 AM Medical Record Number: [id number] Patient Account Number: [id number] Date of Birth/Sex: 07/29/1956 (65 y.o. M) Treating RN: Donnamarie Poag Primary Care Provider: [name] Other Clinician: Referring Provider: [name] Treating Provider/Extender: Yaakov Guthrie in Treatment: 4 History of Present Illness HPI Description: Admission 11/10/2021 [name] is a 66 year old male with a past medical history of uncontrolled type 2 diabetes with last hemoglobin A1O of 8.1 complicated by peripheral neuropathy that presents to the clinic for a 2-year history of nonhealing ulcer to the bottom of his right foot. He states this started out as a blister caused by a work boot. He reports receiving wound care when he resided in [place]. He is reestablishing his wound care in [facility] today. He is currently keeping the area clean and covered. He has insoles designed to help offload the wound bed. He currently denies signs of infection. 1/18; patient presents for follow-up. He has been using Hydrofera Blue to the wound bed. He has no issues or complaints today. He has not received the defender.[name] He denies signs of infection. 2/1; patient presents for follow-up. He has been using Hydrofera Blue to the wound  bed he states he received the defender boot and has been using it however he did not have it on today. He currently denies signs of infection to the right foot. Unfortunately he developed a wound to the left great toe over the past week. He states he received new orthotics and these caused a blister to the left great toe which turned into a wound. He has not been doing  anything to the wound bed. He currently denies systemic signs of infection. 2/8; Patient presents for follow-up. 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 He states he is about 90% recovered. He has some mild weakness to the left side. He reports taking the antibiotics prescribed at last clinic visit. He reports using Hydrofera Blue for dressing changes. He only uses the offloading boot when he is at home. He uses open toed shoes to the right foot. He currently denies signs of infection. Electronic Signature(s) Signed: 12/08/2021 9:31:28 AM By: Kalman Shan DO Entered By: Kalman Shan on 12/08/2021 09:26:12 [name] ([id number]) -------------------------------------------------------------------------------- Physical Exam Details Patient Name: [name] Date of Service: 12/08/2021 8:30 AM Medical Record Number: [id number] Patient Account Number: [id number] Date of Birth/Sex: 19-Jan-1956 (65 y.o. M) Treating RN: Donnamarie Poag Primary Care Provider: [name] Other Clinician: Referring Provider: [name] Treating Provider/Extender: Yaakov Guthrie in Treatment: 4 Constitutional . Cardiovascular . Psychiatric . Notes Right foot: To the plantar aspect there is a punched-out ulcer with increased depth in the center and Mostly granulation tissue With minimal circumferential callus. No drainage noted. No surrounding signs of infection. Left foot: To the left great toe there is a large  open wound with nonviable tissue And granulation tissue. There is devitalized tissue circumferentially. No purulent drainage. Electronic Signature(s) Signed: 12/08/2021 9:31:28 AM By: Kalman Shan DO Entered By: Kalman Shan on 12/08/2021 09:27:25 [name] ([id number]) -------------------------------------------------------------------------------- Physician Orders Details Patient Name: [name] Date of Service: 12/08/2021 8:30 AM Medical Record Number: [id number] Patient Account Number: [id number] Date of Birth/Sex: [birth year] (65 y.o. M) Treating RN: Donnamarie Poag Primary Care Provider: [name] Other Clinician: Referring Provider: [name] Treating Provider/Extender: Yaakov Guthrie in Treatment: 4 Verbal / Phone Orders: No Diagnosis Coding Follow-up Appointments o Return Appointment in 1 week. o Nurse Visit as needed Bathing/ Shower/ Hygiene o May shower; gently cleanse wound with antibacterial soap, rinse and pat dry prior to dressing wounds - change dressing after shower or keep dressing dry; keep covered o No tub bath. Anesthetic (Use 'Patient Medications' Section for Anesthetic Order Entry) o Lidocaine applied to wound bed Edema Control - Lymphedema / Segmental Compressive Device / Other o Elevate legs to the level of the heart and pump ankles as often as possible o Elevate leg(s) parallel to the floor when sitting. o DO YOUR BEST to sleep in the bed at night. DO NOT sleep in your recliner. Long hours of sitting in a recliner leads to swelling of the legs and/or potential wounds on your backside. Off-Loading o Foot Defender o - Right foot-MAKE SURE A SOCK OR STOCKINETTE COVERS YOUR SKIN AND NOT IN CONTACT WITH THE BOOT o Open toe surgical shoe - left Additional Orders / Instructions o Follow Nutritious Diet and Increase Protein Intake Medications-Please add to medication list. o P.O. Antibiotics - Finish as  directed Wound Treatment Wound #1 - Foot Wound Laterality: Plantar, Right Cleanser: Byram Ancillary Kit - 15 Day Supply (Generic) 1 x Per Day/15 Days Discharge Instructions: Use supplies as instructed; Kit contains: (15) Saline Bullets; (15) 3x3 Gauze; 15 pr Gloves Cleanser: Soap and Water 1 x Per Day/15 Days Discharge Instructions: Gently cleanse wound with antibacterial soap, rinse and pat dry prior to dressing wounds Cleanser: Wound Cleanser 1 x Per Day/15 Days Discharge Instructions: Wash your hands with soap and water. Remove old dressing,  discard into plastic bag and place into trash. Cleanse the wound with Wound Cleanser prior to applying a clean dressing using gauze sponges, not tissues or cotton balls. Do not scrub or use excessive force. Pat dry using gauze sponges, not tissue or cotton balls. Topical: Gentamicin 1 x Per Day/15 Days Discharge Instructions: IN THE OFFICE ONLY-Apply as directed by provider. Primary Dressing: Hydrofera Blue Ready Transfer Foam, 2.5x2.5 (in/in) (Generic) 1 x Per Day/15 Days Discharge Instructions: CUT TO FIT THE WOUND BED-Apply Hydrofera Blue Ready to wound bed as directed Secondary Dressing: ABD Pad 5x9 (in/in) (Generic) 1 x Per Day/15 Days Discharge Instructions: Cover with ABD pad/may cut in half Secured With: 68M [place] Surgical Tape, 2x2 (in/yd) (Generic) 1 x Per Day/15 Days Wound #2 - Toe Great Wound Laterality: Left Cleanser: Byram Ancillary Kit - 15 Day Supply (Generic) 1 x Per Day/15 Days Discharge Instructions: Use supplies as instructed; Kit contains: (15) Saline Bullets; (15) 3x3 Gauze; 15 pr Gloves Scala, Other ([id number]) Cleanser: Soap and Water 1 x Per Day/15 Days Discharge Instructions: Gently cleanse wound with antibacterial soap, rinse and pat dry prior to dressing wounds Cleanser: Wound Cleanser 1 x Per Day/15 Days Discharge Instructions: Wash your hands with soap and water. Remove old dressing, discard into plastic  bag and place into trash. Cleanse the wound with Wound Cleanser prior to applying a clean dressing using gauze sponges, not tissues or cotton balls. Do not scrub or use excessive force. Pat dry using gauze sponges, not tissue or cotton balls. Topical: Gentamicin 1 x Per Day/15 Days Discharge Instructions: IN THE OFFICE ONLY-Apply as directed by provider. Primary Dressing: Hydrofera Blue Ready Transfer Foam, 2.5x2.5 (in/in) (Generic) 1 x Per Day/15 Days Discharge Instructions: CUT TO FIT THE WOUND BED-Apply Hydrofera Blue Ready to wound bed as directed Secondary Dressing: ABD Pad 5x9 (in/in) (Generic) 1 x Per Day/15 Days Discharge Instructions: Cover with ABD pad/may cut in half Secured With: 68M [place] Surgical Tape, 2x2 (in/yd) (Generic) 1 x Per Day/15 Days Electronic Signature(s) Signed: 12/08/2021 9:31:28 AM By: Kalman Shan DO Entered By: Kalman Shan on 12/08/2021 09:30:47 [name] ([id number]) -------------------------------------------------------------------------------- Problem List Details Patient Name: [name] Date of Service: 12/08/2021 8:30 AM Medical Record Number: [id number] Patient Account Number: [id number] Date of Birth/Sex: 22-Apr-1956 (65 y.o. 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 11/10/2021 No Yes exposed L97.528 Non-pressure chronic ulcer of other part of left foot with other specified 12/01/2021 No Yes severity E11.621 Type 2 diabetes mellitus with foot ulcer 11/10/2021 No Yes E11.40 Type 2 diabetes mellitus with diabetic neuropathy, unspecified 11/10/2021 No Yes R26.89 Other abnormalities of gait and mobility 11/10/2021 No Yes Inactive  Problems Resolved Problems Electronic Signature(s) Signed: 12/08/2021 9:31:28 AM By: Kalman Shan DO Entered By: Kalman Shan on 12/08/2021 09:25:00 [name] ([id number]) -------------------------------------------------------------------------------- Progress Note Details Patient Name: [name] Date of Service: 12/08/2021 8:30 AM Medical Record Number: [id number] Patient Account Number: [id number] Date of Birth/Sex: July 18, 1956 (65 y.o. M) Treating RN: Donnamarie Poag Primary Care Provider: [name] Other Clinician: Referring Provider: [name] Treating Provider/Extender: Yaakov Guthrie in Treatment: 4 Subjective Chief Complaint Information obtained from Patient Right plantar foot wound History of Present Illness (HPI) Admission 11/10/2021 [name] is a 66 year old  male with a past medical history of uncontrolled type 2 diabetes with last hemoglobin D7A of 8.1 complicated by peripheral neuropathy that presents to the clinic for a 2-year history of nonhealing ulcer to the bottom of his right foot. He states this started out as a blister caused by a work boot. He reports receiving wound care when he resided in [place]. He is reestablishing his wound care in [facility] today. He is currently keeping the area clean and covered. He has insoles designed to help offload the wound bed. He currently denies signs of infection. 1/18; patient presents for follow-up. He has been using Hydrofera Blue to the wound bed. He has no issues or complaints today. He has not received the defender.[name] He denies signs of infection. 2/1; patient presents for follow-up. He has been using Hydrofera Blue to the wound bed he states he received the defender boot and has been using it however he did not have it on today. He currently denies signs of infection to the right foot. Unfortunately he developed a wound to the left great toe over the past week. He states he  received new orthotics and these caused a blister to the left great toe which turned into a wound. He has not been doing anything to the wound bed. He currently denies systemic signs of infection. 2/8; Patient presents for follow-up. 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 He states he is about 90% recovered. He has some mild weakness to the left side. He reports taking the antibiotics prescribed at last clinic visit. He reports using Hydrofera Blue for dressing changes. He only uses the offloading boot when he is at home. He uses open toed shoes to the right foot. He currently denies signs of infection. Objective Constitutional Vitals Time Taken: 8:40 AM, Height: 74 in, Weight: 226 lbs, BMI: 29, Temperature: 98.0 F, Pulse: 83 bpm, Respiratory Rate: 16 breaths/min, Blood Pressure: 98/61 mmHg. General Notes: Right foot: To the plantar aspect there is a punched-out ulcer with increased depth in the center and Mostly granulation tissue With minimal circumferential callus. No drainage noted. No surrounding signs of infection. Left foot: To the left great toe there is a large open wound with nonviable tissue And granulation tissue. There is devitalized tissue circumferentially. No purulent drainage. Integumentary (Hair, Skin) Wound #1 status is Open. Original cause of wound was Blister. The date acquired was: 07/15/2020. The wound has been in treatment 4 weeks. The wound is located on the [place]. The wound measures 1.2cm length x 1.2cm width x 0.5cm depth; 1.131cm^2 area and 0.565cm^3 volume. There is Fat Layer (Subcutaneous Tissue) exposed. There is no tunneling noted, however, there is undermining starting at 7:00 and ending at 2:00 with a maximum distance of 0.3cm. There is a medium amount of serosanguineous drainage noted. The wound margin is thickened. There is large  (67-100%) red, pink granulation within the wound bed. There is a small (1-33%) amount of necrotic tissue within the wound bed including Adherent Slough. Wound #2 status is Open. Original cause of wound was Gradually Appeared. The date acquired was: 11/25/2021. The wound has been in treatment 1 weeks. The wound is located on the Left Toe Great. The wound measures 2.3cm length x 2.3cm width x 0.5cm depth; 4.155cm^2 area and 2.077cm^3 volume. There is Fat Layer (Subcutaneous Tissue) exposed. There is no tunneling or undermining  noted. There is a medium amount of serosanguineous drainage noted. There is small (1-33%) red granulation within the wound bed. There is a large (67-100%) amount of necrotic tissue within the wound bed including Eschar and Adherent Slough. 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 I debrided nonviable tissue to the left great toe. No obvious signs of surrounding infection. He does not need to continue with another course of antibiotics. I recommended continuing with Hydrofera Blue with dressing changes and aggressively offloading the areas. Follow-up in 1 week. Procedures Wound #2 Pre-procedure diagnosis of Wound #2 is a Diabetic Wound/Ulcer of the Lower Extremity located on the Left Toe Great .Severity of Tissue Pre Debridement is: Fat layer exposed. There was a Excisional Skin/Subcutaneous Tissue Debridement with a total area of 9 sq cm performed by Kalman Shan, MD. With the following instrument(s): Curette to remove Viable and Non-Viable tissue/material.  Material removed includes Subcutaneous Tissue, Slough, and Skin: Dermis after achieving pain control using Lidocaine. A time out was conducted at 09:03, prior to the start of the procedure. A Moderate amount of bleeding was controlled with Silver Nitrate. The procedure was tolerated well. Post Debridement Measurements: 2.5cm length x 2.5cm width x 0.5cm depth; 2.454cm^3 volume. Character of Wound/Ulcer Post Debridement is improved. Severity of Tissue Post Debridement is: Fat layer exposed. Post procedure Diagnosis Wound #2: Same as Pre-Procedure General Notes: 2 sticks silver nitrate used for hypergranulation. Plan Follow-up Appointments: Return Appointment in 1 week. Nurse Visit as needed Bathing/ Shower/ Hygiene: May shower; gently cleanse wound with antibacterial soap, rinse and pat dry prior to dressing wounds - change dressing after shower or keep dressing dry; keep covered No tub bath. Anesthetic (Use 'Patient Medications' Section for Anesthetic Order Entry): Lidocaine applied to wound bed Edema Control - Lymphedema / Segmental Compressive Device / Other: Elevate legs to the level of the heart and pump ankles as often as possible Elevate leg(s) parallel to the floor when sitting. DO YOUR BEST to sleep in the bed at night. DO NOT sleep in your recliner. Long hours of sitting in a recliner leads to swelling of the legs and/or potential wounds on your backside. Off-Loading: Foot Defender  - Right foot-MAKE SURE A SOCK OR STOCKINETTE COVERS YOUR SKIN AND NOT IN CONTACT WITH THE BOOT Open toe surgical shoe - left Additional Orders / Instructions: Follow Nutritious Diet and Increase Protein Intake Medications-Please add to medication list.: P.O. Antibiotics - Finish as directed WOUND #1: - Foot Wound Laterality: Plantar, Right Cleanser: Byram Ancillary Kit - 15 Day Supply (Generic) 1 x Per Day/15 Days Discharge Instructions: Use supplies as instructed; Kit contains: (15) Saline  Bullets; (15) 3x3 Gauze; 15 pr Gloves Cleanser: Soap and Water 1 x Per Day/15 Days Discharge Instructions: Gently cleanse wound with antibacterial soap, rinse and pat dry prior to dressing wounds Cleanser: Wound Cleanser 1 x Per Day/15 Days Discharge Instructions: Wash your hands with soap and water. Remove old dressing, discard into plastic bag and place into trash. Cleanse the wound with Wound Cleanser prior to applying a clean dressing using gauze sponges, not tissues or cotton balls. Do not scrub or use excessive force. [name]  dry using gauze sponges, not tissue or cotton balls. Topical: Gentamicin 1 x Per Day/15 Days Discharge Instructions: IN THE OFFICE ONLY-Apply as directed by provider. Primary Dressing: Hydrofera Blue Ready Transfer Foam, 2.5x2.5 (in/in) (Generic) 1 x Per Day/15 Days Discharge Instructions: CUT TO FIT THE WOUND BED-Apply Hydrofera Blue Ready to wound bed as directed Secondary Dressing: ABD Pad 5x9 (in/in) (Generic) 1 x Per Day/15 Days Discharge Instructions: Cover with ABD pad/may cut in half [name], [name] ([id number]) Secured With: 25M Medipore H Soft Cl[place] Surgical Tape, 2x2 (in/yd) (Generic) 1 x Per Day/15 Days WOUND #2: - Toe Great Wound Laterality: Left Cleanser: Byram Ancillary Kit - 15 Day Supply (Generic) 1 x Per Day/15 Days Discharge Instructions: Use supplies as instructed; Kit contains: (15) Saline Bullets; (15) 3x3 Gauze; 15 pr Gloves Cleanser: Soap and Water 1 x Per Day/15 Days Discharge Instructions: Gently cleanse wound with antibacterial soap, rinse and pat dry prior to dressing wounds Cleanser: Wound Cleanser 1 x Per Day/15 Days Discharge Instructions: Wash your hands with soap and water. Remove old dressing, discard into plastic bag and place into trash. Cleanse the wound with Wound Cleanser prior to applying a clean dressing using gauze sponges, not tissues or cotton balls. Do not scrub or use excessive force. Pat dry using gauze sponges, not tissue or  cotton balls. Topical: Gentamicin 1 x Per Day/15 Days Discharge Instructions: IN THE OFFICE ONLY-Apply as directed by provider. Primary Dressing: Hydrofera Blue Ready Transfer Foam, 2.5x2.5 (in/in) (Generic) 1 x Per Day/15 Days Discharge Instructions: CUT TO FIT THE WOUND BED-Apply Hydrofera Blue Ready to wound bed as directed Secondary Dressing: ABD Pad 5x9 (in/in) (Generic) 1 x Per Day/15 Days Discharge Instructions: Cover with ABD pad/may cut in half Secured With: 25M Medipore H Soft Cloth Surgical Tape, 2x2 (in/yd) (Generic) 1 x Per Day/15 Days 1. In office sharp debridement 2. Hydrofera Blue 3. Aggressive offloading 4. Follow-up in 1 week Electronic Signature(s) Signed: 12/08/2021 9:31:28 AM By: Kalman Shan DO Entered By: Kalman Shan on 12/08/2021 09:28:33 [name] ([id number]) -------------------------------------------------------------------------------- SuperBill Details Patient Name: [name] Date of Service: 12/08/2021 Medical Record Number: [id number] Patient Account Number: [id number] Date of Birth/Sex: [birth year] (65 y.o. M) Treating RN: Donnamarie Poag Primary Care Provider: [name] Other Clinician: Referring Provider: [name] Treating Provider/Extender: Yaakov Guthrie in Treatment: 4 Diagnosis Coding ICD-10 Codes Code Description [phone] 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ed E11.621 Type 2 diabetes mellitus with foot ulcer E11.40 Type 2 diabetes mellitus with diabetic neuropathy, unspecified R26.89 Other abnormalities of gait and mobility L97.528 Non-pressure chronic ulcer of other part of left foot with other specified severity Facility Procedures CPT4 Code: [id number] Description: 01601 - DEB SUBQ TISSUE 20 SQ CM/< Modifier: Quantity: 1 CPT4 Code: Description: ICD-10 Diagnosis Description L97.528 Non-pressure chronic ulcer of other part of left foot with other specified E11.621 Type 2  diabetes mellitus with foot ulcer R26.89 Other abnormalities of gait and mobility Modifier: severity Quantity: Physician Procedures CPT4 Code: [id number] Description: 11042 - WC PHYS SUBQ TISS 20 SQ CM Modifier: Quantity: 1 CPT4 Code: Description: ICD-10 Diagnosis Description L97.528 Non-pressure chronic ulcer of other part of left foot with other specified E11.621 Type 2 diabetes mellitus with foot ulcer R26.89 Other abnormalities of gait and mobility Modifier: severity Quantity: Electronic Signature(s) Signed: 12/08/2021 9:31:28 AM By: Kalman Shan DO Entered By: Kalman Shan on 12/08/2021 09:30:18

## 2021-12-08 NOTE — Progress Notes (Signed)
PEACE, JOST ([id number]) Visit Report for 12/08/2021 Arrival Information Details Patient Name: [name], [name] Date of Service: 12/08/2021 8:30 AM Medical Record Number: [id number] Patient Account Number: [id number] Date of Birth/Sex: [birth year] (65 y.o. M) Treating RN: Donnamarie Poag Primary Care Damian Buckles: [name] Other Clinician: Referring Sela Falk: [name] Treating Oliviya Gilkison/Extender: Yaakov Guthrie in Treatment: 4 Visit Information History Since Last Visit Added or deleted any medications: Yes Patient Arrived: Ambulatory Had a fall or experienced change in No Arrival Time: 08:40 activities of daily living that may affect Accompanied By: self risk of falls: Transfer Assistance: None Hospitalized since last visit: Yes Patient Identification Verified: Yes Has Dressing in Place as Prescribed: No Secondary Verification Process Completed: Yes Pain Present Now: No Patient Requires Transmission-Based No Precautions: Patient Has Alerts: Yes Patient Alerts: Patient on Blood Thinner DIABETIC Plavix Electronic Signature(s) Signed: 12/08/2021 4:26:12 PM By: Donnamarie Poag Entered By: Donnamarie Poag on 12/08/2021 08:43:53 [name] ([id number]) -------------------------------------------------------------------------------- Encounter Discharge Information Details Patient Name: [name] Date of Service: 12/08/2021 8:30 AM Medical Record Number: [id number] Patient Account Number: [id number] Date of Birth/Sex: 06-Apr-1956 (65 y.o. M) Treating RN: Donnamarie Poag Primary Care Sacheen Arrasmith: [name] Other Clinician: Referring Gracen Ringwald: [name] Treating Justa Hatchell/Extender: Yaakov Guthrie in Treatment: 4 Encounter Discharge Information Items Post Procedure Vitals Discharge Condition: Stable Temperature (F): 98 Ambulatory Status: Ambulatory Pulse (bpm): 83 Discharge Destination: Home Respiratory Rate (breaths/min): 16 Transportation: Private Auto Blood  Pressure (mmHg): 98/61 Accompanied By: self Schedule Follow-up Appointment: Yes Clinical Summary of Care: Notes encouraged to hydrate; denied dizziness; safety education Electronic Signature(s) Signed: 12/08/2021 4:26:12 PM By: Donnamarie Poag Entered By: Donnamarie Poag on 12/08/2021 09:24:51 [name] ([id number]) -------------------------------------------------------------------------------- Lower Extremity Assessment Details Patient Name: [name] Date of Service: 12/08/2021 8:30 AM Medical Record Number: [id number] Patient Account Number: [id number] Date of Birth/Sex: Jan 09, 1956 (65 y.o. M) Treating RN: Donnamarie Poag Primary Care Zygmund Passero: [name] Other Clinician: Referring Dametri Ozburn: [name] Treating Maddox Hlavaty/Extender: Yaakov Guthrie in Treatment: 4 Edema Assessment Assessed: [Left: Yes] [Right: Yes] Edema: [Left: No] [Right: No] Vascular Assessment Pulses: Dorsalis Pedis Palpable: [Left:Yes] [Right:Yes] Electronic Signature(s) Signed: 12/08/2021 4:26:12 PM By: Donnamarie Poag Entered By: Donnamarie Poag on 12/08/2021 08:52:42 [name] ([id number]) -------------------------------------------------------------------------------- Multi Wound Chart Details Patient Name: [name] Date of Service: 12/08/2021 8:30 AM Medical Record Number: [id number] Patient Account Number: [id number] Date of Birth/Sex: 06/04/1956 (65 y.o. 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 of the Lower N/A Extremity Extremity Comorbid History: Type II Diabetes, Osteoarthritis Type II Diabetes, Osteoarthritis N/A Date Acquired: 07/15/2020 11/25/2021 N/A Weeks of Treatment: 4 1 N/A Wound Status: Open Open N/A Wound Recurrence: No No N/A Measurements L x W x D (cm) 1.2x1.2x0.5 2.3x2.3x0.5 N/A Area (cm) : 1.131 4.155 N/A Volume (cm) : 0.565 2.077 N/A % Reduction in Area: -0.70% 47.10% N/A % Reduction in Volume: 16.20% 47.10% N/A Starting Position 1 (o'clock): 7 Ending Position 1 (o'clock): 2 Maximum Distance 1 (cm): 0.3 Undermining: Yes No N/A Classification: Grade 2 Grade 2 N/A Exudate Amount: Medium Medium N/A Exudate Type: Serosanguineous Serosanguineous N/A Exudate Color: red, brown red, brown N/A Wound Margin: Thickened N/A N/A Granulation  Amount: Large (67-100%) Small (1-33%) N/A Granulation Quality: Red, Pink Red N/A Necrotic Amount: Small (1-33%) Large (67-100%) N/A Necrotic Tissue: Adherent [place] N/A Exposed Structures: Fat Layer (Subcutaneous Tissue): Fat Layer (Subcutaneous Tissue): N/A Yes Yes Fascia: No Fascia: No Tendon: No Tendon: No Muscle: No Muscle: No Joint: No Joint: No Bone: No Bone: No Epithelialization: None N/A N/A Treatment Notes Electronic Signature(s) Signed: 12/08/2021 4:26:12 PM By: Joanna Hews, [name] ([id number]) Entered By: Donnamarie Poag on 12/08/2021 08:53:09 [name] ([id number]) -------------------------------------------------------------------------------- Gays Details Patient Name: [name] Date of Service: 12/08/2021 8:30 AM Medical Record Number: [id number] Patient Account Number: [id number] Date of Birth/Sex: 18-Dec-1955 (65 y.o. M) Treating RN: Donnamarie Poag Primary Care Julissa Browning: [name] Other Clinician: Referring Zakeria Kulzer: [name] Treating Tacy Chavis/Extender: Yaakov Guthrie in Treatment: 4 Active Inactive Wound/Skin  Impairment Nursing Diagnoses: Impaired tissue integrity Knowledge deficit related to smoking impact on wound healing Knowledge deficit related to ulceration/compromised skin integrity Goals: Patient/caregiver will verbalize understanding of skin care regimen Date Initiated: 11/10/2021 Date Inactivated: 12/01/2021 Target Resolution Date: 11/17/2021 Goal Status: Met Ulcer/skin breakdown will have a volume reduction of 30% by week 4 Date Initiated: 11/10/2021 Target Resolution Date: 12/08/2021 Goal Status: Active Ulcer/skin breakdown will have a volume reduction of 50% by week 8 Date Initiated: 11/10/2021 Target Resolution Date: 01/05/2022 Goal Status: Active Ulcer/skin breakdown will have a volume reduction of 80% by week 12 Date Initiated: 11/10/2021 Target Resolution Date: 02/02/2022 Goal Status: Active Ulcer/skin breakdown will heal within 14 weeks Date Initiated: 11/10/2021 Target Resolution Date: 02/16/2022 Goal Status: Active Interventions: Assess patient/caregiver ability to obtain necessary supplies Assess patient/caregiver ability to perform ulcer/skin care regimen upon admission and as needed Assess ulceration(s) every visit Notes: Electronic Signature(s) Signed: 12/08/2021 4:26:12 PM By: Donnamarie Poag Entered By: Donnamarie Poag on 12/08/2021 08:52:53 [name] ([id number]) -------------------------------------------------------------------------------- Pain Assessment Details Patient Name: [name] Date of Service: 12/08/2021 8:30 AM Medical Record Number: [id number] Patient Account Number: [id number] Date of Birth/Sex: Jun 14, 1956 (65 y.o. M) Treating RN: Donnamarie Poag Primary Care Mercedies Ganesh: [name] Other Clinician: Referring Lucca Ballo: [name] Treating Sherran Margolis/Extender: Yaakov Guthrie in Treatment: 4 Active Problems Location of Pain Severity and Description of Pain Patient Has Paino No Site Locations Rate the pain. Current Pain Level: 0 Pain  Management and Medication Current Pain Management: Electronic Signature(s) Signed: 12/08/2021 4:26:12 PM By: Donnamarie Poag Entered By: Donnamarie Poag on 12/08/2021 08:44:19 [name] ([id number]) -------------------------------------------------------------------------------- Patient/Caregiver Education Details Patient Name: [name] Date of Service: 12/08/2021 8:30 AM Medical Record Number: [id number] Patient Account Number: [id number] Date of Birth/Gender: 1955-11-02 (65 y.o. M) Treating RN: Donnamarie Poag Primary Care Physician: [name] Other Clinician: Referring Physician: [name] Treating Physician/Extender: Yaakov Guthrie in Treatment: 4 Education Assessment Education Provided To: Patient Education Topics Provided Basic Hygiene: Infection: Nutrition: Wound Debridement: Wound/Skin Impairment: Electronic Signature(s) Signed: 12/08/2021 4:26:12 PM By: Donnamarie Poag Entered By: Donnamarie Poag on 12/08/2021 09:23:19 [name] ([id number]) -------------------------------------------------------------------------------- Wound Assessment Details Patient Name: [name] Date of Service: 12/08/2021 8:30 AM Medical Record Number: [id number] Patient Account Number: [id number] Date of Birth/Sex: 10-03-1956 (65 y.o. M) Treating RN: Donnamarie Poag Primary Care Tracina Beaumont: [name] Other Clinician: Referring Emilya Justen: [name] Treating Tabathia Knoche/Extender: Yaakov Guthrie in Treatment: 4 Wound Status Wound Number: 1 Primary Etiology: Diabetic Wound/Ulcer of the Lower Extremity Wound Location: Right, Plantar Foot Wound Status: Open Wounding Event: Blister Comorbid History: Type II Diabetes, Osteoarthritis Date Acquired: 07/15/2020 Weeks Of Treatment: 4 Clustered Wound: No Photos  Wound Measurements Length: (cm) 1.2 % Reduct Width: (cm) 1.2 % Reduct Depth: (cm) 0.5 Epitheli Area: (cm) 1.131 Tunneli Volume: (cm) 0.565  Undermi Start Endin Maxim ion in Area: -0.7% ion in Volume: 16.2% alization: None ng: No ning: Yes ing Position (o'clock): 7 g Position (o'clock): 2 um Distance: (cm) 0.3 Wound Description Classification: Grade 2 Foul Odo Wound Margin: Thickened Slough/F Exudate Amount: Medium Exudate Type: Serosanguineous Exudate Color: red, brown r After Cleansing: No ibrino Yes Wound Bed Granulation Amount: Large (67-100%) Exposed Structure Granulation Quality: Red, Pink Fascia Exposed: No Necrotic Amount: Small (1-33%) Fat Layer (Subcutaneous Tissue) Exposed: Yes Necrotic Quality: Adherent Slough Tendon Exposed: No Muscle Exposed: No Joint Exposed: No Bone Exposed: No Treatment Notes Wound #1 (Foot) Wound Laterality: Plantar, Right Cleanser Cassetta, Tereso ([id number]) [employer] Ancillary Kit - 15 Day Supply Discharge Instruction: Use supplies as instructed; Kit contains: (15) Saline Bullets; (15) 3x3 Gauze; 15 pr Gloves Soap and Water Discharge Instruction: Gently cleanse wound with antibacterial soap, rinse and pat dry prior to dressing wounds Wound Cleanser Discharge Instruction: Wash your hands with soap and water. Remove old dressing, discard into plastic bag and place into trash. Cleanse the wound with Wound Cleanser prior to applying a clean dressing using gauze sponges, not tissues or cotton balls. Do not scrub or use excessive force. Pat dry using gauze sponges, not tissue or cotton balls. Peri-Wound Care Topical Gentamicin Discharge Instruction: IN THE OFFICE ONLY-Apply as directed by Gio Janoski. Primary Dressing Hydrofera Blue Ready Transfer Foam, 2.5x2.5 (in/in) Discharge Instruction: CUT TO FIT THE WOUND BED-Apply Hydrofera Blue Ready to wound bed as directed Secondary Dressing ABD Pad 5x9 (in/in) Discharge Instruction: Cover with ABD pad/may cut in half Secured With 11M [place] Surgical Tape, 2x2 (in/yd) Compression Wrap Compression  Stockings Add-Ons Electronic Signature(s) Signed: 12/08/2021 4:26:12 PM By: Donnamarie Poag Entered By: Donnamarie Poag on 12/08/2021 08:51:20 [name] ([id number]) -------------------------------------------------------------------------------- Wound Assessment Details Patient Name: [name] Date of Service: 12/08/2021 8:30 AM Medical Record Number: [id number] Patient Account Number: [id number] Date of Birth/Sex: 1956/05/13 (65 y.o. M) Treating RN: Donnamarie Poag Primary Care Skylan Lara: [name] Other Clinician: Referring Giuliano Preece: [name] Treating Eldrick Penick/Extender: Yaakov Guthrie in Treatment: 4 Wound Status Wound Number: 2 Primary Etiology: Diabetic Wound/Ulcer of the Lower Extremity Wound Location: Left Toe Great Wound Status: Open Wounding Event: Gradually Appeared Notes: pending amputation on presentation noted this wound #2 Date Acquired: 11/25/2021 Comorbid Type II Diabetes, Osteoarthritis Weeks Of Treatment: 1 History: Clustered Wound: No Photos Wound Measurements Length: (cm) 2.3 Width: (cm) 2.3 Depth: (cm) 0.5 Area: (cm) 4.155 Volume: (cm) 2.077 % Reduction in Area: 47.1% % Reduction in Volume: 47.1% Tunneling: No Undermining: No Wound Description Classification: Grade 2 Exudate Amount: Medium Exudate Type: Serosanguineous Exudate Color: red, brown Foul Odor After Cleansing: No Slough/Fibrino Yes Wound Bed Granulation Amount: Small (1-33%) Exposed Structure Granulation Quality: Red, Hyper-granulation Fascia Exposed: No Necrotic Amount: Large (67-100%) Fat Layer (Subcutaneous Tissue) Exposed: Yes Necrotic Quality: Eschar, Adherent Slough Tendon Exposed: No Muscle Exposed: No Joint Exposed: No Bone Exposed: No Treatment Notes Wound #2 (Toe Great) Wound Laterality: Left Cleanser [employer] Ancillary Kit - 15 Day Supply Discharge Instruction: Use supplies as instructed; Kit contains: (15) Saline Bullets; (15) 3x3 Gauze; 15 pr  Gloves Soap and Water Discharge Instruction: Gently cleanse wound with antibacterial soap, rinse and pat dry prior to dressing wounds Kathman, Garion ([id number]) Wound Cleanser Discharge Instruction: Wash your hands with soap and water. Remove old dressing, discard into plastic bag  and place into trash. Cleanse the wound with Wound Cleanser prior to applying a clean dressing using gauze sponges, not tissues or cotton balls. Do not scrub or use excessive force. Pat dry using gauze sponges, not tissue or cotton balls. Peri-Wound Care Topical Gentamicin Discharge Instruction: IN THE OFFICE ONLY-Apply as directed by Nohely Whitehorn. Primary Dressing Hydrofera Blue Ready Transfer Foam, 2.5x2.5 (in/in) Discharge Instruction: CUT TO FIT THE WOUND BED-Apply Hydrofera Blue Ready to wound bed as directed Secondary Dressing ABD Pad 5x9 (in/in) Discharge Instruction: Cover with ABD pad/may cut in half Secured With 68M [place] Surgical Tape, 2x2 (in/yd) Compression Wrap Compression Stockings Add-Ons Electronic Signature(s) Signed: 12/08/2021 4:26:12 PM By: Donnamarie Poag Entered By: Donnamarie Poag on 12/08/2021 11:16:56 [name] ([id number]) -------------------------------------------------------------------------------- [place] Details Patient Name: [name] Date of Service: 12/08/2021 8:30 AM Medical Record Number: [id number] Patient Account Number: [id number] Date of Birth/Sex: 24-Apr-1956 (65 y.o. M) Treating RN: Donnamarie Poag Primary Care Dondi Aime: [name] Other Clinician: Referring Rekha Hobbins: [name] Treating Terianne Thaker/Extender: Yaakov Guthrie in Treatment: 4 Vital Signs Time Taken: 08:40 Temperature (F): 98.0 Height (in): 74 Pulse (bpm): 83 Weight (lbs): 226 Respiratory Rate (breaths/min): 16 Body Mass Index (BMI): 29 Blood Pressure (mmHg): 98/61 Reference Range: 80 - 120 mg / dl Electronic Signature(s) Signed: 12/08/2021 4:26:12 PM By: Donnamarie Poag Entered ByDonnamarie Poag on 12/08/2021 08:44:09

## 2021-12-15 ENCOUNTER — Ambulatory Visit
Admission: RE | Admit: 2021-12-15 | Discharge: 2021-12-15 | Disposition: A | Discharge status: Home / Self Care | Payer: Medicare Other | Attending: Internal Medicine | Admitting: Internal Medicine

## 2021-12-15 ENCOUNTER — Ambulatory Visit
Admission: RE | Admit: 2021-12-15 | Discharge: 2021-12-15 | Disposition: A | Discharge status: Home / Self Care | Payer: Medicare Other | Source: Ambulatory Visit | Attending: Internal Medicine | Admitting: Internal Medicine

## 2021-12-15 ENCOUNTER — Other Ambulatory Visit: Payer: Self-pay | Admitting: Internal Medicine

## 2021-12-15 ENCOUNTER — Other Ambulatory Visit: Payer: Self-pay

## 2021-12-15 ENCOUNTER — Encounter (HOSPITAL_BASED_OUTPATIENT_CLINIC_OR_DEPARTMENT_OTHER): Payer: Medicare Other | Admitting: Internal Medicine

## 2021-12-15 DIAGNOSIS — L97528 Non-pressure chronic ulcer of other part of left foot with other specified severity: Secondary | ICD-10-CM | POA: Diagnosis not present

## 2021-12-15 DIAGNOSIS — E1140 Type 2 diabetes mellitus with diabetic neuropathy, unspecified: Secondary | ICD-10-CM | POA: Diagnosis not present

## 2021-12-15 DIAGNOSIS — S81802A Unspecified open wound, left lower leg, initial encounter: Secondary | ICD-10-CM

## 2021-12-15 DIAGNOSIS — L97512 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ed: Secondary | ICD-10-CM

## 2021-12-15 DIAGNOSIS — E11621 Type 2 diabetes mellitus with foot ulcer: Secondary | ICD-10-CM

## 2021-12-15 NOTE — Progress Notes (Signed)
[name], [name] ([id number]) Visit Report for 12/15/2021 Chief Complaint Document Details Patient Name: [name], [name] Date of Service: 12/15/2021 8:30 AM Medical Record Number: [id number] Patient Account Number: [id number] Date of Birth/Sex: [birth year] (66 y.o. M) Treating RN: Donnamarie Poag Primary Care Provider: [name] Other Clinician: Referring Provider: [name] Treating Provider/Extender: Yaakov Guthrie in Treatment: 5 Information Obtained from: Patient Chief Complaint Right plantar foot wound Electronic Signature(s) Signed: 12/15/2021 10:25:05 AM By: Kalman Shan DO Entered By: Kalman Shan on 12/15/2021 10:09:09 [name] ([id number]) -------------------------------------------------------------------------------- Debridement Details Patient Name: [name] Date of Service: 12/15/2021 8:30 AM Medical Record Number: [id number] Patient Account Number: [id number] Date of Birth/Sex: 04-10-1956 (66 y.o. M) Treating RN: Donnamarie Poag Primary Care Provider: [name] Other Clinician: Referring Provider: [name] Treating Provider/Extender: Yaakov Guthrie in Treatment: 5 Debridement Performed for Wound #1 Right,Plantar Foot Assessment: Performed By: Physician Kalman Shan, MD Debridement Type: Debridement Severity of Tissue Pre Debridement: Fat layer exposed Level of Consciousness (Pre- Awake and Alert procedure): Pre-procedure Verification/Time Out Yes - 09:05 Taken: Start Time: 09:06 Pain Control: Lidocaine Total Area Debrided (L x W): 1.2 (cm) x 0.8 (cm) = 0.96 (cm) Tissue and other material Non-Viable, Callus debrided: Level: Non-Viable Tissue Debridement Description: Selective/Open Wound Instrument: Blade, Forceps Bleeding: None Response to Treatment: Procedure was tolerated well Level of Consciousness (Post- Awake and Alert procedure): Post Debridement Measurements of Total Wound Length: (cm) 1.2 Width:  (cm) 0.8 Depth: (cm) 0.5 Volume: (cm) 0.377 Character of Wound/Ulcer Post Debridement: Improved Severity of Tissue Post Debridement: Fat layer exposed Post Procedure Diagnosis Same as Pre-procedure Electronic Signature(s) Signed: 12/15/2021 10:25:05 AM By: Kalman Shan DO Signed: 12/15/2021 12:16:10 PM By: Donnamarie Poag Entered By: Donnamarie Poag on 12/15/2021 09:07:26 [name] ([id number]) -------------------------------------------------------------------------------- Debridement Details Patient Name: [name] Date of Service: 12/15/2021 8:30 AM Medical Record Number: [id number] Patient Account Number: [id number] Date of Birth/Sex: [birth year] (66 y.o. M) Treating RN: Donnamarie Poag Primary Care Provider: [name] Other Clinician: Referring Provider: [name] Treating Provider/Extender: Yaakov Guthrie in Treatment: 5 Debridement Performed for Wound #2 Left Toe Great Assessment: Performed By: Physician Kalman Shan, MD Debridement Type: Debridement Severity of Tissue Pre Debridement: Fat layer exposed Level of Consciousness (Pre- Awake and Alert procedure): Pre-procedure Verification/Time Out Yes - 09:05 Taken: Start Time: 09:06 Pain Control: Lidocaine Total Area Debrided (L x W): 2 (cm) x 2 (cm) = 4 (cm) Tissue and other material Viable, Non-Viable, Slough, Skin: Dermis , Slough debrided: Level: Skin/Dermis Debridement Description: Selective/Open Wound Instrument: Blade, Forceps Bleeding: Minimum Hemostasis Achieved: Pressure Response to Treatment: Procedure was tolerated well Level of Consciousness (Post- Awake and Alert procedure): Post Debridement Measurements of Total Wound Length: (cm) 3.3 Width: (cm) 1.8 Depth: (cm) 0.5 Volume: (cm) 2.333 Character of Wound/Ulcer Post Debridement: Improved Severity of Tissue Post Debridement: Fat layer exposed Post Procedure Diagnosis Same as Pre-procedure Electronic Signature(s) Signed:  12/15/2021 10:25:05 AM By: Kalman Shan DO Signed: 12/15/2021 12:16:10 PM By: Donnamarie Poag Entered By: Donnamarie Poag on 12/15/2021 09:11:37 [name] ([id number]) -------------------------------------------------------------------------------- HPI Details Patient Name: [name] Date of Service: 12/15/2021 8:30 AM Medical Record Number: [id number] Patient Account Number: [id number] Date of Birth/Sex: 10-Apr-1956 (66 y.o. M) Treating RN: Donnamarie Poag Primary Care Provider: [name] Other Clinician: Referring Provider: [name] Treating Provider/Extender: Yaakov Guthrie in Treatment: 5 History of Present Illness HPI Description: Admission 11/10/2021 Mr. [name] is a 66 year old male with a past medical history of uncontrolled type 2 diabetes with last hemoglobin A1c of  8.1 complicated by peripheral neuropathy that presents to the clinic for a 2-year history of nonhealing ulcer to the bottom of his right foot. He states this started out as a blister caused by a work boot. He reports receiving wound care when he resided in [place]. He is reestablishing his wound care in [facility] today. He is currently keeping the area clean and covered. He has insoles designed to help offload the wound bed. He currently denies signs of infection. 1/18; patient presents for follow-up. He has been using Hydrofera Blue to the wound bed. He has no issues or complaints today. He has not received the defender.[name] He denies signs of infection. 2/1; patient presents for follow-up. He has been using Hydrofera Blue to the wound bed he states he received the defender boot and has been using it however he did not have it on today. He currently denies signs of infection to the right foot. Unfortunately he developed a wound to the left great toe over the past week. He states he received new orthotics and these caused a blister to the left great toe which turned into a wound. He has  not been doing anything to the wound bed. He currently denies systemic signs of infection. 2/8; Patient presents for follow-up. 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 He states he is about 90% recovered. He has some mild weakness to the left side. He reports taking the antibiotics prescribed at last clinic visit. He reports using Hydrofera Blue for dressing changes. He only uses the offloading boot when he is at home. He uses open toed shoes to the right foot. He currently denies signs of infection. 2/15; patient presents for follow-up. He states he is still taking the antibiotics prescribed. He reports using the defender boot to the right foot and open toed shoe to the left foot however he is not wearing either today. He currently denies systemic signs of infection. He stated he cut down a tree last week and was outside doing yard work. Electronic Signature(s) Signed: 12/15/2021 10:25:05 AM By: Kalman Shan DO Entered By: Kalman Shan on 12/15/2021 10:10:07 [name] ([id number]) -------------------------------------------------------------------------------- Physical Exam Details Patient Name: [name] Date of Service: 12/15/2021 8:30 AM Medical Record Number: [id number] Patient Account Number: [id number] Date of Birth/Sex: Dec 19, 1955 (66 y.o. M) Treating RN: Donnamarie Poag Primary Care Provider: [name] Other Clinician: Referring Provider: [name] Treating Provider/Extender: Yaakov Guthrie in Treatment: 5 Constitutional . Cardiovascular . Psychiatric . Notes Right foot: To the plantar aspect there is a punched-out ulcer with increased depth in the center and Mostly granulation tissue With circumferential callus. No drainage noted. No surrounding signs of infection. Left foot: To the left great toe there is a large open wound  with nonviable tissue And granulation tissue. The toenail is black with surrounding dried blood on the surface. Also there is callus to the distal aspect with dried blood underneath. No purulent drainage. Electronic Signature(s) Signed: 12/15/2021 10:25:05 AM By: Kalman Shan DO Entered By: Kalman Shan on 12/15/2021 10:12:51 [name] ([id number]) -------------------------------------------------------------------------------- Physician Orders Details Patient Name: [name] Date of Service: 12/15/2021 8:30 AM Medical Record Number: [id number] Patient Account Number: [id number] Date of Birth/Sex: [birth year] (66 y.o. M) Treating RN: Donnamarie Poag Primary Care Provider: [name] Other Clinician: Referring Provider: [name] Treating Provider/Extender: Yaakov Guthrie in Treatment: 5 Verbal /  Phone Orders: No Diagnosis Coding Follow-up Appointments o Return Appointment in 1 week. o Nurse Visit as needed Bathing/ Shower/ Hygiene o May shower; gently cleanse wound with antibacterial soap, rinse and pat dry prior to dressing wounds - change dressing after shower or keep dressing dry; keep covered o No tub bath. Anesthetic (Use 'Patient Medications' Section for Anesthetic Order Entry) o Lidocaine applied to wound bed Edema Control - Lymphedema / Segmental Compressive Device / Other o Elevate legs to the level of the heart and pump ankles as often as possible o Elevate leg(s) parallel to the floor when sitting. o DO YOUR BEST to sleep in the bed at night. DO NOT sleep in your recliner. Long hours of sitting in a recliner leads to swelling of the legs and/or potential wounds on your backside. Off-Loading o Foot Defender o - Right foot-MAKE SURE A SOCK OR STOCKINETTE COVERS YOUR SKIN AND NOT IN CONTACT WITH THE BOOT o Open toe surgical shoe - left Additional Orders / Instructions o Follow Nutritious Diet and Increase Protein  Intake Medications-Please add to medication list. o P.O. Antibiotics - Finish as directed and pick up 1 week extended supply Wound Treatment Wound #1 - Foot Wound Laterality: Plantar, Right Cleanser: Byram Ancillary Kit - 15 Day Supply (Generic) 1 x Per Day/15 Days Discharge Instructions: Use supplies as instructed; Kit contains: (15) Saline Bullets; (15) 3x3 Gauze; 15 pr Gloves Cleanser: Soap and Water 1 x Per Day/15 Days Discharge Instructions: Gently cleanse wound with antibacterial soap, rinse and pat dry prior to dressing wounds Cleanser: Wound Cleanser 1 x Per Day/15 Days Discharge Instructions: Wash your hands with soap and water. Remove old dressing, discard into plastic bag and place into trash. Cleanse the wound with Wound Cleanser prior to applying a clean dressing using gauze sponges, not tissues or cotton balls. Do not scrub or use excessive force. Pat dry using gauze sponges, not tissue or cotton balls. Topical: Gentamicin 1 x Per Day/15 Days Discharge Instructions: IN THE OFFICE ONLY-Apply as directed by provider. Primary Dressing: Hydrofera Blue Ready Transfer Foam, 2.5x2.5 (in/in) (Generic) 1 x Per Day/15 Days Discharge Instructions: CUT TO FIT THE WOUND BED-Apply Hydrofera Blue Ready to wound bed as directed Secondary Dressing: ABD Pad 5x9 (in/in) (Generic) 1 x Per Day/15 Days Discharge Instructions: cut to fit-Cover with ABD pad/may cut in half Secured With: 88M [place] Surgical Tape, 2x2 (in/yd) (Generic) 1 x Per Day/15 Days Discharge Instructions: tape ABD to foot to hold Secured With: Conforming Stretch Gauze Bandage 4x75 (in/in) 1 x Per Day/15 Days Discharge Instructions: Apply as directed Mehlman, Donavyn ([id number]) Wound #2 - Toe Great Wound Laterality: Left Cleanser: Byram Ancillary Kit - 15 Day Supply (Generic) 1 x Per Day/15 Days Discharge Instructions: Use supplies as instructed; Kit contains: (15) Saline Bullets; (15) 3x3 Gauze; 15 pr  Gloves Cleanser: Soap and Water 1 x Per Day/15 Days Discharge Instructions: Gently cleanse wound with antibacterial soap, rinse and pat dry prior to dressing wounds Cleanser: Wound Cleanser 1 x Per Day/15 Days Discharge Instructions: Wash your hands with soap and water. Remove old dressing, discard into plastic bag and place into trash. Cleanse the wound with Wound Cleanser prior to applying a clean dressing using gauze sponges, not tissues or cotton balls. Do not scrub or use excessive force. Pat dry using gauze sponges, not tissue or cotton balls. Topical: Gentamicin 1 x Per Day/15 Days Discharge Instructions: IN THE OFFICE ONLY-Apply as directed by provider. Primary Dressing: Hydrofera Blue  Ready Transfer Foam, 2.5x2.5 (in/in) (Generic) 1 x Per Day/15 Days Discharge Instructions: CUT TO FIT THE WOUND BED-Apply Hydrofera Blue Ready to wound bed as directed Secondary Dressing: ABD Pad 5x9 (in/in) (Generic) 1 x Per Day/15 Days Discharge Instructions: cut to fit-Cover with ABD pad/may cut in half Secured With: [place] Surgical Tape, 2x2 (in/yd) (Generic) 1 x Per Day/15 Days Discharge Instructions: tape ABD to foot to hold Secured With: Conforming Stretch Gauze Bandage 4x75 (in/in) 1 x Per Day/15 Days Discharge Instructions: Apply as directed Radiology o X-ray, foot - left foot-non healing wound left great toe Patient Medications Allergies: No Known Allergies Notifications Medication Indication Start End Augmentin 12/15/2021 DOSE 1 - oral 875 mg-125 mg tablet - 1 tablet oral BID x 7 days Electronic Signature(s) Signed: 12/15/2021 10:25:05 AM By: Kalman Shan DO Previous Signature: 12/15/2021 10:23:09 AM Version By: Kalman Shan DO Entered By: Kalman Shan on 12/15/2021 10:24:39 [name] ([id number]) -------------------------------------------------------------------------------- Problem List Details Patient Name: [name] Date of Service: 12/15/2021  8:30 AM Medical Record Number: [id number] Patient Account Number: [id number] Date of Birth/Sex: June 16, 1956 (66 y.o. 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 11/10/2021 No Yes exposed L97.528 Non-pressure chronic ulcer of other part of left foot with other specified 12/01/2021 No Yes severity E11.621 Type 2 diabetes mellitus with foot ulcer 11/10/2021 No Yes E11.40 Type 2 diabetes mellitus with diabetic neuropathy, unspecified 11/10/2021 No Yes R26.89 Other abnormalities of gait and mobility 11/10/2021 No Yes Inactive Problems Resolved Problems Electronic Signature(s) Signed: 12/15/2021 10:25:05 AM By: Kalman Shan DO Entered By: Kalman Shan on 12/15/2021 10:09:04 [name] ([id number]) -------------------------------------------------------------------------------- Progress Note Details Patient Name: [name] Date of Service: 12/15/2021 8:30 AM Medical Record Number: [id number] Patient Account Number: [id number] Date of Birth/Sex: 06-Apr-1956 (66 y.o. M) Treating RN: Donnamarie Poag Primary Care Provider: [name] Other Clinician: Referring Provider: [name] Treating Provider/Extender: Yaakov Guthrie in Treatment: 5 Subjective Chief Complaint Information obtained from Patient Right plantar foot wound History of Present Illness (HPI) Admission 11/10/2021 Mr. Jonnie Truxillo is a 66 year old male with a past medical history of uncontrolled type 2 diabetes with last hemoglobin D6L of 8.1 complicated by peripheral neuropathy that presents to the clinic for a 2-year history of nonhealing ulcer to the bottom of his right foot. He states this started out as a  blister caused by a work boot. He reports receiving wound care when he resided in [place]. He is reestablishing his wound care in [facility] today. He is currently keeping the area clean and covered. He has insoles designed to help offload the wound bed. He currently denies signs of infection. 1/18; patient presents for follow-up. He has been using Hydrofera Blue to the wound bed. He has no issues or complaints today. He has not received the defender.[name] He denies signs of infection. 2/1; patient presents for follow-up. He has been using Hydrofera Blue to the wound bed he states he received the defender boot and has been using it however he did not have it on today. He currently denies signs of infection to the right foot. Unfortunately he developed a wound to the left great toe over the past week. He states he received new orthotics and these caused a blister to the left  great toe which turned into a wound. He has not been doing anything to the wound bed. He currently denies systemic signs of infection. 2/8; Patient presents for follow-up. 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 He states he is about 90% recovered. He has some mild weakness to the left side. He reports taking the antibiotics prescribed at last clinic visit. He reports using Hydrofera Blue for dressing changes. He only uses the offloading boot when he is at home. He uses open toed shoes to the right foot. He currently denies signs of infection. 2/15; patient presents for follow-up. He states he is still taking the antibiotics prescribed. He reports using the defender boot to the right foot and open toed shoe to the left foot however he is not wearing either today. He currently denies systemic signs of infection. He stated he cut down a tree last week and was outside doing yard  work. Objective Constitutional Vitals Time Taken: 8:32 AM, Height: 74 in, Weight: 226 lbs, BMI: 29, Temperature: 98.1 F, Pulse: 102 bpm, Respiratory Rate: 16 breaths/min, Blood Pressure: 124/70 mmHg. General Notes: Right foot: To the plantar aspect there is a punched-out ulcer with increased depth in the center and Mostly granulation tissue With circumferential callus. No drainage noted. No surrounding signs of infection. Left foot: To the left great toe there is a large open wound with nonviable tissue And granulation tissue. The toenail is black with surrounding dried blood on the surface. Also there is callus to the distal aspect with dried blood underneath. No purulent drainage. Integumentary (Hair, Skin) Wound #1 status is Open. Original cause of wound was Blister. The date acquired was: 07/15/2020. The wound has been in treatment 5 weeks. The wound is located on the [place]. The wound measures 1.2cm length x 0.8cm width x 0.5cm depth; 0.754cm^2 area and 0.377cm^3 volume. There is Fat Layer (Subcutaneous Tissue) exposed. There is no tunneling noted, however, there is undermining starting at 7:00 and ending at 12:00 with a maximum distance of 0.4cm. There is a medium amount of serosanguineous drainage noted. The wound margin is thickened. There is large (67-100%) red, pink granulation within the wound bed. There is a small (1-33%) amount of necrotic tissue within the wound bed including Adherent Slough. Wound #2 status is Open. Original cause of wound was Gradually Appeared. The date acquired was: 11/25/2021. The wound has been in treatment 2 weeks. The wound is located on the Left Toe Great. The wound measures 3.3cm length x 1.8cm width x 0.5cm depth; 4.665cm^2 area and 2.333cm^3 volume. There is Fat Layer (Subcutaneous Tissue) exposed. There is no tunneling noted, however, there is undermining starting at 12:00 and ending at 1:00 with a maximum distance of 0.4cm. There is a medium  amount of serosanguineous drainage noted. There is small (1-33%) red, hyper - granulation within the wound bed. There is a large (67-100%) amount of necrotic tissue within the wound bed including Hannibal, Breanna ([id number]) Eschar and Adherent Slough. 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 I recommended continuing Hydrofera Blue and the defender boot to the right  foot. The left great toe wound appears slightly worse. I debrided nonviable tissue. His toenail is black and there is a callus to the distal aspect of the toe with dried blood underneath. It is obvious that this had repeated trauma to it. This coincides with his story of cutting down a tree and being outside doing yard work with tennis shoes. I had recommended using open toed shoes previously to avoid this issue. I recommended he not do any strenuous activities from now on. It is also unclear if he is actually dressing this wound. I told him he is at high risk for losing the left great toe. I prescribed him Augmentin previously and he is still taking this although he should have been done with this course last week. I will continue the Augmentin due to the fact that the toe is swollen and I will also obtain an x-ray. We had a long discussion about the importance of aggressive offloading. Follow-up in 1 week. Procedures Wound #1 Pre-procedure diagnosis of Wound #1 is a Diabetic Wound/Ulcer of the Lower Extremity located on the [place] .Severity of Tissue Pre Debridement is: Fat layer exposed. There was a Selective/Open Wound Non-Viable Tissue Debridement with a total area of 0.96 sq cm  performed by Kalman Shan, MD. With the following instrument(s): Blade, and Forceps to remove Non-Viable tissue/material. Material removed includes Callus after achieving pain control using Lidocaine. A time out was conducted at 09:05, prior to the start of the procedure. There was no bleeding. The procedure was tolerated well. Post Debridement Measurements: 1.2cm length x 0.8cm width x 0.5cm depth; 0.377cm^3 volume. Character of Wound/Ulcer Post Debridement is improved. Severity of Tissue Post Debridement is: Fat layer exposed. Post procedure Diagnosis Wound #1: Same as Pre-Procedure Wound #2 Pre-procedure diagnosis of Wound #2 is a Diabetic Wound/Ulcer of the Lower Extremity located on the Left Toe Great .Severity of Tissue Pre Debridement is: Fat layer exposed. There was a Selective/Open Wound Skin/Dermis Debridement with a total area of 4 sq cm performed by Kalman Shan, MD. With the following instrument(s): Blade, and Forceps to remove Viable and Non-Viable tissue/material. Material removed includes Slough and Skin: Dermis and after achieving pain control using Lidocaine. A time out was conducted at 09:05, prior to the start of the procedure. A Minimum amount of bleeding was controlled with Pressure. The procedure was tolerated well. Post Debridement Measurements: 3.3cm length x 1.8cm width x 0.5cm depth; 2.333cm^3 volume. Character of Wound/Ulcer Post Debridement is improved. Severity of Tissue Post Debridement is: Fat layer exposed. Post procedure Diagnosis Wound #2: Same as Pre-Procedure Plan Follow-up Appointments: Return Appointment in 1 week. Nurse Visit as needed Bathing/ Shower/ Hygiene: May shower; gently cleanse wound with antibacterial soap, rinse and pat dry prior to dressing wounds - change dressing after shower or keep dressing dry; keep covered No tub bath. Anesthetic (Use 'Patient Medications' Section for Anesthetic Order Entry): Lidocaine applied to wound  bed Edema Control - Lymphedema / Segmental Compressive Device / Other: Elevate legs to the level of the heart and pump ankles as often as possible Elevate leg(s) parallel to the floor when sitting. DO YOUR BEST to sleep in the bed at night. DO NOT sleep in your recliner. Long hours of sitting in a recliner leads to swelling of the legs and/or potential wounds on your backside. Off-Loading: Brimage, Coltan ([id number]) Foot Defender  - Right foot-MAKE SURE A SOCK OR STOCKINETTE COVERS YOUR SKIN AND NOT IN CONTACT WITH THE BOOT Open toe surgical shoe -  left Additional Orders / Instructions: Follow Nutritious Diet and Increase Protein Intake Medications-Please add to medication list.: P.O. Antibiotics - Finish as directed and pick up 1 week extended supply Radiology ordered were: X-ray, foot - left foot-non healing wound left great toe WOUND #1: - Foot Wound Laterality: Plantar, Right Cleanser: Byram Ancillary Kit - 15 Day Supply (Generic) 1 x Per Day/15 Days Discharge Instructions: Use supplies as instructed; Kit contains: (15) Saline Bullets; (15) 3x3 Gauze; 15 pr Gloves Cleanser: Soap and Water 1 x Per Day/15 Days Discharge Instructions: Gently cleanse wound with antibacterial soap, rinse and pat dry prior to dressing wounds Cleanser: Wound Cleanser 1 x Per Day/15 Days Discharge Instructions: Wash your hands with soap and water. Remove old dressing, discard into plastic bag and place into trash. Cleanse the wound with Wound Cleanser prior to applying a clean dressing using gauze sponges, not tissues or cotton balls. Do not scrub or use excessive force. Pat dry using gauze sponges, not tissue or cotton balls. Topical: Gentamicin 1 x Per Day/15 Days Discharge Instructions: IN THE OFFICE ONLY-Apply as directed by provider. Primary Dressing: Hydrofera Blue Ready Transfer Foam, 2.5x2.5 (in/in) (Generic) 1 x Per Day/15 Days Discharge Instructions: CUT TO FIT THE WOUND BED-Apply Hydrofera Blue  Ready to wound bed as directed Secondary Dressing: ABD Pad 5x9 (in/in) (Generic) 1 x Per Day/15 Days Discharge Instructions: cut to fit-Cover with ABD pad/may cut in half Secured With: [place] Surgical Tape, 2x2 (in/yd) (Generic) 1 x Per Day/15 Days Discharge Instructions: tape ABD to foot to hold Secured With: Conforming Stretch Gauze Bandage 4x75 (in/in) 1 x Per Day/15 Days Discharge Instructions: Apply as directed WOUND #2: - Toe Great Wound Laterality: Left Cleanser: Byram Ancillary Kit - 15 Day Supply (Generic) 1 x Per Day/15 Days Discharge Instructions: Use supplies as instructed; Kit contains: (15) Saline Bullets; (15) 3x3 Gauze; 15 pr Gloves Cleanser: Soap and Water 1 x Per Day/15 Days Discharge Instructions: Gently cleanse wound with antibacterial soap, rinse and pat dry prior to dressing wounds Cleanser: Wound Cleanser 1 x Per Day/15 Days Discharge Instructions: Wash your hands with soap and water. Remove old dressing, discard into plastic bag and place into trash. Cleanse the wound with Wound Cleanser prior to applying a clean dressing using gauze sponges, not tissues or cotton balls. Do not scrub or use excessive force. Pat dry using gauze sponges, not tissue or cotton balls. Topical: Gentamicin 1 x Per Day/15 Days Discharge Instructions: IN THE OFFICE ONLY-Apply as directed by provider. Primary Dressing: Hydrofera Blue Ready Transfer Foam, 2.5x2.5 (in/in) (Generic) 1 x Per Day/15 Days Discharge Instructions: CUT TO FIT THE WOUND BED-Apply Hydrofera Blue Ready to wound bed as directed Secondary Dressing: ABD Pad 5x9 (in/in) (Generic) 1 x Per Day/15 Days Discharge Instructions: cut to fit-Cover with ABD pad/may cut in half Secured With: [place] Surgical Tape, 2x2 (in/yd) (Generic) 1 x Per Day/15 Days Discharge Instructions: tape ABD to foot to hold Secured With: Conforming Stretch Gauze Bandage 4x75 (in/in) 1 x Per Day/15 Days Discharge  Instructions: Apply as directed 1. In office sharp debridement 2. Hydrofera Blue 3. Aggressive offloading 4. Follow-up in 1 week 5. Augmentin 6. Left foot x-ray Electronic Signature(s) Signed: 12/15/2021 10:25:05 AM By: Kalman Shan DO Entered By: Kalman Shan on 12/15/2021 10:20:45 [name] ([id number]) -------------------------------------------------------------------------------- SuperBill Details Patient Name: [name] Date of Service: 12/15/2021 Medical Record Number: [id number] Patient Account Number: [id number] Date of Birth/Sex: [birth year] (66 y.o. M) Treating  RN: Donnamarie Poag Primary Care Provider: [name] Other Clinician: Referring Provider: [name] Treating Provider/Extender: Yaakov Guthrie in Treatment: 5 Diagnosis Coding ICD-10 Codes Code Description [phone] 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ed E11.621 Type 2 diabetes mellitus with foot ulcer E11.40 Type 2 diabetes mellitus with diabetic neuropathy, unspecified R26.89 Other abnormalities of gait and mobility L97.528 Non-pressure chronic ulcer of other part of left foot with other specified severity Facility Procedures CPT4 Code: 24097353 Description: 29924 - DEBRIDE WOUND 1ST 20 SQ CM OR < Modifier: Quantity: 1 CPT4 Code: Description: ICD-10 Diagnosis Description L97.512 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 L97.528 Non-pressure chronic ulcer of other part of left foot with other specified Modifier: ed severity Quantity: Physician Procedures CPT4 Code: 2683419 Description: 99214 - WC PHYS LEVEL 4 - EST PT Modifier: Quantity: 1 CPT4 Code: Description: ICD-10 Diagnosis Description L97.512 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 E11.621 Type 2 diabetes mellitus with foot ulcer L97.528 Non-pressure chronic ulcer of other part of left foot with other  specified  E11.40 Type 2 diabetes mellitus with diabetic neuropathy, unspecified Modifier: ed severity Quantity: CPT4 Code: 6222979 Description: 89211 - WC PHYS DEBR WO ANESTH 20 SQ CM Modifier: Quantity: 1 CPT4 Code: Description: ICD-10 Diagnosis Description L97.512 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

## 2021-12-15 NOTE — Progress Notes (Signed)
JAVAR, ESHBACH ([id number]) Visit Report for 12/15/2021 Arrival Information Details Patient Name: [name], [name] Date of Service: 12/15/2021 8:30 AM Medical Record Number: [id number] Patient Account Number: [id number] Date of Birth/Sex: 1956/07/07 (65 y.o. M) Treating RN: Donnamarie Poag Primary Care Ming Mcmannis: [name] Other Clinician: Referring Nyheem Binette: [name] Treating Rockell Faulks/Extender: Yaakov Guthrie in Treatment: 5 Visit Information History Since Last Visit Added or deleted any medications: No Patient Arrived: Ambulatory Had a fall or experienced change in No Arrival Time: 08:30 activities of daily living that may affect Accompanied By: self risk of falls: Transfer Assistance: EasyPivot Patient Lift Hospitalized since last visit: No Patient Identification Verified: Yes Has Dressing in Place as Prescribed: Yes Secondary Verification Process Completed: Yes Pain Present Now: Yes Patient Requires Transmission-Based No Precautions: Patient Has Alerts: Yes Patient Alerts: Patient on Blood Thinner DIABETIC Plavix Electronic Signature(s) Signed: 12/15/2021 12:16:10 PM By: Donnamarie Poag Entered By: Donnamarie Poag on 12/15/2021 08:37:19 [name] ([id number]) -------------------------------------------------------------------------------- Encounter Discharge Information Details Patient Name: [name] Date of Service: 12/15/2021 8:30 AM Medical Record Number: [id number] Patient Account Number: [id number] Date of Birth/Sex: Mar 07, 1956 (65 y.o. M) Treating RN: Donnamarie Poag Primary Care Jarell Mcewen: [name] Other Clinician: Referring Shamiyah Ngu: [name] Treating Taiwana Willison/Extender: Yaakov Guthrie in Treatment: 5 Encounter Discharge Information Items Post Procedure Vitals Discharge Condition: Stable Temperature (F): 98 Ambulatory Status: Ambulatory Pulse (bpm): 102 Discharge Destination: Home Respiratory Rate (breaths/min):  16 Transportation: Ambulance Blood Pressure (mmHg): 124/70 Accompanied By: self Schedule Follow-up Appointment: Yes Clinical Summary of Care: Electronic Signature(s) Signed: 12/15/2021 12:16:10 PM By: Donnamarie Poag Entered By: Donnamarie Poag on 12/15/2021 09:31:44 [name] ([id number]) -------------------------------------------------------------------------------- Lower Extremity Assessment Details Patient Name: [name] Date of Service: 12/15/2021 8:30 AM Medical Record Number: [id number] Patient Account Number: [id number] Date of Birth/Sex: [birth year] (65 y.o. M) Treating RN: Donnamarie Poag Primary Care Dyland Panuco: [name] Other Clinician: Referring Tyrique Sporn: [name] Treating Asar Evilsizer/Extender: Yaakov Guthrie in Treatment: 5 Edema Assessment Assessed: [Left: Yes] [Right: Yes] Edema: [Left: No] [Right: No] Vascular Assessment Pulses: Dorsalis Pedis Palpable: [Left:Yes] [Right:Yes] Electronic Signature(s) Signed: 12/15/2021 12:16:10 PM By: Donnamarie Poag Entered By: Donnamarie Poag on 12/15/2021 08:48:12 [name] ([id number]) -------------------------------------------------------------------------------- Multi Wound Chart Details Patient Name: [name] Date of Service: 12/15/2021 8:30 AM Medical Record Number: [id number] Patient Account Number: [id number] Date of Birth/Sex: [birth year] (65 y.o. M) Treating RN: Donnamarie Poag Primary Care Aarianna Hoadley: [name] Other Clinician: Referring Chera Slivka: [name] Treating Saysha Menta/Extender: Yaakov Guthrie in Treatment: 5 Vital Signs Height(in): 74 Pulse(bpm): 102 Weight(lbs): 226 Blood Pressure(mmHg): 124/70 Body Mass Index(BMI): 29 Temperature(F): 98.1 Respiratory Rate(breaths/min): 16 Photos: [N/A:N/A] Wound Location: Right, Plantar Foot Left Toe Great N/A Wounding Event: Blister Gradually Appeared N/A Primary Etiology: Diabetic Wound/Ulcer of the Lower Diabetic Wound/Ulcer of the  Lower N/A Extremity Extremity Comorbid History: Type II Diabetes, Osteoarthritis Type II Diabetes, Osteoarthritis N/A Date Acquired: 07/15/2020 11/25/2021 N/A Weeks of Treatment: 5 2 N/A Wound Status: Open Open N/A Wound Recurrence: No No N/A Measurements L x W x D (cm) 1.2x0.8x0.5 3.3x1.8x0.5 N/A Area (cm) : 0.754 4.665 N/A Volume (cm) : 0.377 2.333 N/A % Reduction in Area: 32.90% 40.60% N/A % Reduction in Volume: 44.10% 40.60% N/A Starting Position 1 (o'clock): 7 12 Ending Position 1 (o'clock): 12 1 Maximum Distance 1 (cm): 0.4 0.4 Undermining: Yes Yes N/A Classification: Grade 2 Grade 2 N/A Exudate Amount: Medium Medium N/A Exudate Type: Serosanguineous Serosanguineous N/A Exudate Color: red, brown red, brown N/A Wound Margin: Thickened N/A N/A Granulation Amount: Large (67-100%) Small (  1-33%) N/A Granulation Quality: Red, Pink Red, Hyper-granulation N/A Necrotic Amount: Small (1-33%) Large (67-100%) N/A Necrotic Tissue: Adherent [place] N/A Exposed Structures: Fat Layer (Subcutaneous Tissue): Fat Layer (Subcutaneous Tissue): N/A Yes Yes Fascia: No Fascia: No Tendon: No Tendon: No Muscle: No Muscle: No Joint: No Joint: No Bone: No Bone: No Epithelialization: None N/A N/A Treatment Notes Electronic Signature(s) Signed: 12/15/2021 12:16:10 PM By: Joanna Hews, [name] ([id number]) Entered By: Donnamarie Poag on 12/15/2021 08:48:46 [name] ([id number]) -------------------------------------------------------------------------------- Multi-Disciplinary Care Plan Details Patient Name: [name] Date of Service: 12/15/2021 8:30 AM Medical Record Number: [id number] Patient Account Number: [id number] Date of Birth/Sex: [birth year] (65 y.o. M) Treating RN: Donnamarie Poag Primary Care Marni Franzoni: [name] Other Clinician: Referring Ozias Dicenzo: [name] Treating Jahred Tatar/Extender: Yaakov Guthrie in Treatment: 5 Active  Inactive Wound/Skin Impairment Nursing Diagnoses: Impaired tissue integrity Knowledge deficit related to smoking impact on wound healing Knowledge deficit related to ulceration/compromised skin integrity Goals: Patient/caregiver will verbalize understanding of skin care regimen Date Initiated: 11/10/2021 Date Inactivated: 12/01/2021 Target Resolution Date: 11/17/2021 Goal Status: Met Ulcer/skin breakdown will have a volume reduction of 30% by week 4 Date Initiated: 11/10/2021 Target Resolution Date: 12/08/2021 Goal Status: Active Ulcer/skin breakdown will have a volume reduction of 50% by week 8 Date Initiated: 11/10/2021 Target Resolution Date: 01/05/2022 Goal Status: Active Ulcer/skin breakdown will have a volume reduction of 80% by week 12 Date Initiated: 11/10/2021 Target Resolution Date: 02/02/2022 Goal Status: Active Ulcer/skin breakdown will heal within 14 weeks Date Initiated: 11/10/2021 Target Resolution Date: 02/16/2022 Goal Status: Active Interventions: Assess patient/caregiver ability to obtain necessary supplies Assess patient/caregiver ability to perform ulcer/skin care regimen upon admission and as needed Assess ulceration(s) every visit Notes: Electronic Signature(s) Signed: 12/15/2021 12:16:10 PM By: Donnamarie Poag Entered By: Donnamarie Poag on 12/15/2021 08:48:28 [name] ([id number]) -------------------------------------------------------------------------------- Pain Assessment Details Patient Name: [name] Date of Service: 12/15/2021 8:30 AM Medical Record Number: [id number] Patient Account Number: [id number] Date of Birth/Sex: [birth year] (65 y.o. M) Treating RN: Donnamarie Poag Primary Care Lajuana Patchell: [name] Other Clinician: Referring Shiela Bruns: [name] Treating Baird Polinski/Extender: Yaakov Guthrie in Treatment: 5 Active Problems Location of Pain Severity and Description of Pain Patient Has Paino Yes Site Locations Pain Location: Pain in  Ulcers Rate the pain. Current Pain Level: 5 Pain Management and Medication Current Pain Management: Electronic Signature(s) Signed: 12/15/2021 12:16:10 PM By: Donnamarie Poag Entered By: Donnamarie Poag on 12/15/2021 08:38:14 [name] ([id number]) -------------------------------------------------------------------------------- Patient/Caregiver Education Details Patient Name: [name] Date of Service: 12/15/2021 8:30 AM Medical Record Number: [id number] Patient Account Number: [id number] Date of Birth/Gender: Jul 04, 1956 (65 y.o. M) Treating RN: Donnamarie Poag Primary Care Physician: [name] Other Clinician: Referring Physician: [name] Treating Physician/Extender: Yaakov Guthrie in Treatment: 5 Education Assessment Education Provided To: Patient Education Topics Provided Basic Hygiene: Offloading: Wound/Skin Impairment: Electronic Signature(s) Signed: 12/15/2021 12:16:10 PM By: Donnamarie Poag Entered By: Donnamarie Poag on 12/15/2021 09:20:05 [name] ([id number]) -------------------------------------------------------------------------------- Wound Assessment Details Patient Name: [name] Date of Service: 12/15/2021 8:30 AM Medical Record Number: [id number] Patient Account Number: [id number] Date of Birth/Sex: [birth year] (65 y.o. M) Treating RN: Donnamarie Poag Primary Care Alexia Dinger: [name] Other Clinician: Referring Yoselin Amerman: [name] Treating Finnegan Gatta/Extender: Yaakov Guthrie in Treatment: 5 Wound Status Wound Number: 1 Primary Etiology: Diabetic Wound/Ulcer of the Lower Extremity Wound Location: Right, Plantar Foot Wound Status: Open Wounding Event: Blister Comorbid History: Type II Diabetes, Osteoarthritis Date Acquired: 07/15/2020 Weeks Of Treatment: 5 Clustered Wound: No Photos Wound  Measurements °Length: (cm) 1.2 % Reduct °Width: (cm) 0.8 % Reduct °Depth: (cm) 0.5 Epitheli °Area: (cm²) 0.754 Tunneli °Volume: (cm³)  0.377 Undermi °Start °Endin °Maxim °ion in Area: 32.9% °ion in Volume: 44.1% °alization: None °ng: No °ning: Yes °ing Position (o'clock): 7 °g Position (o'clock): 12 °um Distance: (cm) 0.4 °Wound Description °Classification: Grade 2 Foul Od °Wound Margin: Thickened Slough/ °Exudate Amount: Medium °Exudate Type: Serosanguineous °Exudate Color: red, brown °or After Cleansing: No °Fibrino Yes °Wound Bed °Granulation Amount: Large (67-100%) Exposed Structure °Granulation Quality: Red, Pink °Fascia Exposed: No °Necrotic Amount: Small (1-33%) °Fat Layer (Subcutaneous Tissue) Exposed: Yes °Necrotic Quality: Adherent Slough °Tendon Exposed: No °Muscle Exposed: No °Joint Exposed: No °Bone Exposed: No °Treatment Notes °Wound #1 (Foot) Wound Laterality: Plantar, Right °Cleanser °Wilczak, Annie ([id number]) °[employer] Ancillary Kit - 15 Day Supply °Discharge Instruction: Use supplies as instructed; Kit contains: (15) Saline Bullets; (15) 3x3 Gauze; 15 pr Gloves °Soap and Water °Discharge Instruction: Gently cleanse wound with antibacterial soap, rinse and pat dry prior to dressing wounds °Wound Cleanser °Discharge Instruction: Wash your hands with soap and water. Remove old dressing, discard into plastic bag and place into trash. °Cleanse the wound with Wound Cleanser prior to applying a clean dressing using gauze sponges, not tissues or cotton balls. Do not °scrub or use excessive force. Pat dry using gauze sponges, not tissue or cotton balls. °Peri-Wound Care °Topical °Gentamicin °Discharge Instruction: IN THE OFFICE ONLY-Apply as directed by . °Primary Dressing °Hydrofera Blue Ready Transfer Foam, 2.5x2.5 (in/in) °Discharge Instruction: CUT TO FIT THE WOUND BED-Apply Hydrofera Blue Ready to wound bed as directed °Secondary Dressing °ABD Pad 5x9 (in/in) °Discharge Instruction: cut to fit-Cover with ABD pad/may cut in half °Secured With °3M Medipore H Soft Cloth Surgical Tape, 2x2 (in/yd) °Discharge Instruction: tape ABD to  foot to hold °Conforming Stretch Gauze Bandage 4x75 (in/in) °Discharge Instruction: Apply as directed °Compression Wrap °Compression Stockings °Add-Ons °Electronic Signature(s) °Signed: 12/15/2021 12:16:10 PM By: Bishop, Joy °Entered By: Bishop, Joy on 12/15/2021 08:45:04 °Labrie, Amando ([id number]) °-------------------------------------------------------------------------------- °Wound Assessment Details °Patient Name: Cerritos, Antowan °Date of Service: 12/15/2021 8:30 AM °Medical Record Number: [id number] °Patient Account Number: [id number] °Date of Birth/Sex: 06/25/1956 (65 y.o. M) °Treating RN: Bishop, Joy °Primary Care : Fields, Glenda Other Clinician: °Referring : Fields, Glenda °Treating /Extender: Hoffman, Jessica °Weeks in Treatment: 5 °Wound Status °Wound Number: 2 °Primary Etiology: Diabetic Wound/Ulcer of the Lower Extremity °Wound Location: Left Toe Great °Wound Status: Open °Wounding Event: Gradually Appeared °Notes: pending amputation on presentation noted this °wound #2 °Date Acquired: 11/25/2021 °Comorbid Type II Diabetes, Osteoarthritis °Weeks Of Treatment: 2 °History: °Clustered Wound: No °Photos °Wound Measurements °Length: (cm) 3.3 % Redu °Width: (cm) 1.8 % Redu °Depth: (cm) 0.5 Tunnel °Area: (cm²) 4.665 Under °Volume: (cm³) 2.333 Sta °Endi °Maxi °ction in Area: 40.6% °ction in Volume: 40.6% °ing: No °mining: Yes °rting Position (o'clock): 12 °ng Position (o'clock): 1 °mum Distance: (cm) 0.4 °Wound Description °Classification: Grade 2 Foul °Exudate Amount: Medium Sloug °Exudate Type: Serosanguineous °Exudate Color: red, brown °Odor After Cleansing: No °h/Fibrino Yes °Wound Bed °Granulation Amount: Small (1-33%) Exposed Structure °Granulation Quality: Red, Hyper-granulation °Fascia Exposed: No °Necrotic Amount: Large (67-100%) °Fat Layer (Subcutaneous Tissue) Exposed: Yes °Necrotic Quality: Eschar, Adherent Slough °Tendon Exposed: No °Muscle Exposed: No °Joint Exposed: No °Bone  Exposed: No °Treatment Notes °Wound #2 (Toe Great) Wound Laterality: Left °Cleanser °Byram Ancillary Kit - 15 Day Supply °Ticas, Aureliano ([id number]) °Discharge Instruction: Use supplies as instructed; Kit contains: (15) Saline Bullets; (15) 3x3 Gauze;   15 pr Gloves °Soap and Water °Discharge Instruction: Gently cleanse wound with antibacterial soap, rinse and pat dry prior to dressing wounds °Wound Cleanser °Discharge Instruction: Wash your hands with soap and water. Remove old dressing, discard into plastic bag and place into trash. °Cleanse the wound with Wound Cleanser prior to applying a clean dressing using gauze sponges, not tissues or cotton balls. Do not °scrub or use excessive force. Pat dry using gauze sponges, not tissue or cotton balls. °Peri-Wound Care °Topical °Gentamicin °Discharge Instruction: IN THE OFFICE ONLY-Apply as directed by . °Primary Dressing °Hydrofera Blue Ready Transfer Foam, 2.5x2.5 (in/in) °Discharge Instruction: CUT TO FIT THE WOUND BED-Apply Hydrofera Blue Ready to wound bed as directed °Secondary Dressing °ABD Pad 5x9 (in/in) °Discharge Instruction: cut to fit-Cover with ABD pad/may cut in half °Secured With °3M Medipore H Soft Cloth Surgical Tape, 2x2 (in/yd) °Discharge Instruction: tape ABD to foot to hold °Conforming Stretch Gauze Bandage 4x75 (in/in) °Discharge Instruction: Apply as directed °Compression Wrap °Compression Stockings °Add-Ons °Electronic Signature(s) °Signed: 12/15/2021 12:16:10 PM By: Bishop, Joy °Entered By: Bishop, Joy on 12/15/2021 08:47:54 °Janowiak, Wendall ([id number]) °-------------------------------------------------------------------------------- °Vitals Details °Patient Name: Ciszewski, Hameed °Date of Service: 12/15/2021 8:30 AM °Medical Record Number: [id number] °Patient Account Number: [id number] °Date of Birth/Sex: 09/14/1956 (65 y.o. M) °Treating RN: Bishop, Joy °Primary Care : Fields, Glenda Other Clinician: °Referring : Fields,  Glenda °Treating /Extender: Hoffman, Jessica °Weeks in Treatment: 5 °Vital Signs °Time Taken: 08:32 °Temperature (Â°F): 98.1 °Height (in): 74 °Pulse (bpm): 102 °Weight (lbs): 226 °Respiratory Rate (breaths/min): 16 °Body Mass Index (BMI): 29 °Blood Pressure (mmHg): 124/70 °Reference Range: 80 - 120 mg / dl °Electronic Signature(s) °Signed: 12/15/2021 12:16:10 PM By: Bishop, Joy °Entered By: Bishop, Joy on 12/15/2021 08:37:44 °

## 2021-12-16 ENCOUNTER — Observation Stay
Admission: EM | Admit: 2021-12-16 | Discharge: 2021-12-17 | Disposition: A | Discharge status: Home / Self Care | Payer: Medicare Other | Attending: Internal Medicine | Admitting: Internal Medicine

## 2021-12-16 ENCOUNTER — Other Ambulatory Visit: Payer: Self-pay

## 2021-12-16 ENCOUNTER — Inpatient Hospital Stay: Payer: Medicare Other | Admitting: Registered Nurse

## 2021-12-16 ENCOUNTER — Encounter
Admission: EM | Disposition: A | Discharge status: Home / Self Care | Payer: Self-pay | Source: Home / Self Care | Attending: Emergency Medicine

## 2021-12-16 DIAGNOSIS — I10 Essential (primary) hypertension: Secondary | ICD-10-CM | POA: Diagnosis not present

## 2021-12-16 DIAGNOSIS — L97529 Non-pressure chronic ulcer of other part of left foot with unspecified severity: Secondary | ICD-10-CM | POA: Diagnosis not present

## 2021-12-16 DIAGNOSIS — M109 Gout, unspecified: Secondary | ICD-10-CM

## 2021-12-16 DIAGNOSIS — Z7982 Long term (current) use of aspirin: Secondary | ICD-10-CM | POA: Diagnosis not present

## 2021-12-16 DIAGNOSIS — S92422A Displaced fracture of distal phalanx of left great toe, initial encounter for closed fracture: Secondary | ICD-10-CM | POA: Diagnosis not present

## 2021-12-16 DIAGNOSIS — M869 Osteomyelitis, unspecified: Secondary | ICD-10-CM

## 2021-12-16 DIAGNOSIS — Z79899 Other long term (current) drug therapy: Secondary | ICD-10-CM | POA: Insufficient documentation

## 2021-12-16 DIAGNOSIS — X58XXXA Exposure to other specified factors, initial encounter: Secondary | ICD-10-CM | POA: Diagnosis not present

## 2021-12-16 DIAGNOSIS — I639 Cerebral infarction, unspecified: Secondary | ICD-10-CM | POA: Diagnosis present

## 2021-12-16 DIAGNOSIS — E119 Type 2 diabetes mellitus without complications: Secondary | ICD-10-CM

## 2021-12-16 DIAGNOSIS — Z20822 Contact with and (suspected) exposure to covid-19: Secondary | ICD-10-CM | POA: Diagnosis not present

## 2021-12-16 DIAGNOSIS — Z7984 Long term (current) use of oral hypoglycemic drugs: Secondary | ICD-10-CM | POA: Diagnosis not present

## 2021-12-16 DIAGNOSIS — M86172 Other acute osteomyelitis, left ankle and foot: Secondary | ICD-10-CM | POA: Diagnosis present

## 2021-12-16 DIAGNOSIS — E11622 Type 2 diabetes mellitus with other skin ulcer: Secondary | ICD-10-CM | POA: Insufficient documentation

## 2021-12-16 DIAGNOSIS — Z461 Encounter for fitting and adjustment of hearing aid: Secondary | ICD-10-CM | POA: Insufficient documentation

## 2021-12-16 DIAGNOSIS — E872 Acidosis, unspecified: Secondary | ICD-10-CM | POA: Diagnosis present

## 2021-12-16 DIAGNOSIS — M899 Disorder of bone, unspecified: Secondary | ICD-10-CM | POA: Insufficient documentation

## 2021-12-16 DIAGNOSIS — Z7729 Contact with and (suspected ) exposure to other hazardous substances: Secondary | ICD-10-CM | POA: Insufficient documentation

## 2021-12-16 DIAGNOSIS — L259 Unspecified contact dermatitis, unspecified cause: Secondary | ICD-10-CM | POA: Insufficient documentation

## 2021-12-16 HISTORY — PX: AMPUTATION TOE: SHX6595

## 2021-12-16 LAB — COMPREHENSIVE METABOLIC PANEL
ALT: 13 U/L (ref 0–44)
AST: 20 U/L (ref 15–41)
Albumin: 4.2 g/dL (ref 3.5–5.0)
Alkaline Phosphatase: 83 U/L (ref 38–126)
Anion gap: 12 (ref 5–15)
BUN: 17 mg/dL (ref 8–23)
CO2: 24 mmol/L (ref 22–32)
Calcium: 9.9 mg/dL (ref 8.9–10.3)
Chloride: 99 mmol/L (ref 98–111)
Creatinine, Ser: 1.37 mg/dL — ABNORMAL HIGH (ref 0.61–1.24)
GFR, Estimated: 57 mL/min — ABNORMAL LOW (ref >60)
Glucose, Bld: 149 mg/dL — ABNORMAL HIGH (ref 70–99)
Potassium: 4.4 mmol/L (ref 3.5–5.1)
Sodium: 135 mmol/L (ref 135–145)
Total Bilirubin: 0.6 mg/dL (ref 0.3–1.2)
Total Protein: 9.1 g/dL — ABNORMAL HIGH (ref 6.5–8.1)

## 2021-12-16 LAB — URINALYSIS, ROUTINE W REFLEX MICROSCOPIC
Bilirubin Urine: NEGATIVE
Glucose, UA: >=500 mg/dL — AB
Hgb urine dipstick: NEGATIVE
Ketones, ur: NEGATIVE mg/dL
Leukocytes,Ua: NEGATIVE
Nitrite: NEGATIVE
Protein, ur: NEGATIVE mg/dL
Specific Gravity, Urine: 1.021 (ref 1.005–1.030)
Squamous Epithelial / HPF: NONE SEEN (ref 0–5)
pH: 5 (ref 5.0–8.0)

## 2021-12-16 LAB — MRSA NEXT GEN BY PCR, NASAL: MRSA by PCR Next Gen: NOT DETECTED

## 2021-12-16 LAB — CBC WITH DIFFERENTIAL/PLATELET
Abs Immature Granulocytes: 0.02 10*3/uL (ref 0.00–0.07)
Basophils Absolute: 0.1 10*3/uL (ref 0.0–0.1)
Basophils Relative: 1 %
Eosinophils Absolute: 0.4 10*3/uL (ref 0.0–0.5)
Eosinophils Relative: 5 %
HCT: 44.1 % (ref 39.0–52.0)
Hemoglobin: 13.6 g/dL (ref 13.0–17.0)
Immature Granulocytes: 0 %
Lymphocytes Relative: 26 %
Lymphs Abs: 2.1 10*3/uL (ref 0.7–4.0)
MCH: 28.9 pg (ref 26.0–34.0)
MCHC: 30.8 g/dL (ref 30.0–36.0)
MCV: 93.8 fL (ref 80.0–100.0)
Monocytes Absolute: 0.3 10*3/uL (ref 0.1–1.0)
Monocytes Relative: 4 %
Neutro Abs: 4.9 10*3/uL (ref 1.7–7.7)
Neutrophils Relative %: 64 %
Platelets: 304 10*3/uL (ref 150–400)
RBC: 4.7 MIL/uL (ref 4.22–5.81)
RDW: 15.5 % (ref 11.5–15.5)
WBC: 7.9 10*3/uL (ref 4.0–10.5)
nRBC: 0 % (ref 0.0–0.2)

## 2021-12-16 LAB — GLUCOSE, CAPILLARY
Glucose-Capillary: 100 mg/dL — ABNORMAL HIGH (ref 70–99)
Glucose-Capillary: 102 mg/dL — ABNORMAL HIGH (ref 70–99)
Glucose-Capillary: 103 mg/dL — ABNORMAL HIGH (ref 70–99)
Glucose-Capillary: 138 mg/dL — ABNORMAL HIGH (ref 70–99)
Glucose-Capillary: 223 mg/dL — ABNORMAL HIGH (ref 70–99)

## 2021-12-16 LAB — HEMOGLOBIN A1C
Hgb A1c MFr Bld: 6.5 % — ABNORMAL HIGH (ref 4.8–5.6)
Mean Plasma Glucose: 139.85 mg/dL

## 2021-12-16 LAB — RESP PANEL BY RT-PCR (FLU A&B, COVID) ARPGX2
Influenza A by PCR: NEGATIVE
Influenza B by PCR: NEGATIVE
SARS Coronavirus 2 by RT PCR: NEGATIVE

## 2021-12-16 LAB — SEDIMENTATION RATE
Sed Rate: 56 mm/hr — ABNORMAL HIGH (ref 0–20)
Sed Rate: 61 mm/hr — ABNORMAL HIGH (ref 0–20)

## 2021-12-16 LAB — PREALBUMIN: Prealbumin: 23.3 mg/dL (ref 18–38)

## 2021-12-16 LAB — LACTIC ACID, PLASMA
Lactic Acid, Venous: 3.6 mmol/L (ref 0.5–1.9)
Lactic Acid, Venous: 4.8 mmol/L (ref 0.5–1.9)

## 2021-12-16 LAB — C-REACTIVE PROTEIN: CRP: 0.6 mg/dL (ref <1.0)

## 2021-12-16 SURGERY — AMPUTATION, TOE
Anesthesia: General | Site: Toe | Laterality: Left

## 2021-12-16 MED ORDER — LACTATED RINGERS IV BOLUS
1000.0000 mL | Freq: Once | INTRAVENOUS | Status: AC
  Administered 2021-12-16: 1000 mL via INTRAVENOUS

## 2021-12-16 MED ORDER — FENTANYL CITRATE (PF) 100 MCG/2ML IJ SOLN
INTRAMUSCULAR | Status: AC
  Filled 2021-12-16: qty 2

## 2021-12-16 MED ORDER — LIDOCAINE HCL (CARDIAC) PF 100 MG/5ML IV SOSY
PREFILLED_SYRINGE | INTRAVENOUS | Status: DC | PRN
  Administered 2021-12-16: 60 mg via INTRAVENOUS

## 2021-12-16 MED ORDER — MAGNESIUM OXIDE 400 MG PO TABS
400.0000 mg | ORAL_TABLET | Freq: Every day | ORAL | Status: DC
  Administered 2021-12-17: 400 mg via ORAL
  Filled 2021-12-16 (×2): qty 1

## 2021-12-16 MED ORDER — DEXAMETHASONE SODIUM PHOSPHATE 10 MG/ML IJ SOLN
INTRAMUSCULAR | Status: DC | PRN
  Administered 2021-12-16: 10 mg via INTRAVENOUS

## 2021-12-16 MED ORDER — ACETAMINOPHEN 325 MG PO TABS: 650.0000 mg | ORAL_TABLET | Freq: Four times a day (QID) | ORAL | Status: DC | PRN

## 2021-12-16 MED ORDER — ONDANSETRON HCL 4 MG/2ML IJ SOLN
4.0000 mg | Freq: Four times a day (QID) | INTRAMUSCULAR | Status: DC | PRN
Start: 2021-12-16 — End: 2021-12-17

## 2021-12-16 MED ORDER — ONDANSETRON HCL 4 MG/2ML IJ SOLN: 4.0000 mg | Freq: Once | INTRAMUSCULAR | Status: DC | PRN

## 2021-12-16 MED ORDER — FENTANYL CITRATE (PF) 100 MCG/2ML IJ SOLN: 25.0000 ug | INTRAMUSCULAR | Status: DC | PRN

## 2021-12-16 MED ORDER — VANCOMYCIN HCL IN DEXTROSE 1-5 GM/200ML-% IV SOLN
1000.0000 mg | Freq: Once | INTRAVENOUS | Status: DC
  Filled 2021-12-16: qty 200

## 2021-12-16 MED ORDER — VANCOMYCIN HCL IN DEXTROSE 1-5 GM/200ML-% IV SOLN
1000.0000 mg | Freq: Two times a day (BID) | INTRAVENOUS | Status: DC
  Administered 2021-12-17: 1000 mg via INTRAVENOUS
  Filled 2021-12-16 (×2): qty 200

## 2021-12-16 MED ORDER — VITAMIN B-12 1000 MCG PO TABS
1000.0000 ug | ORAL_TABLET | Freq: Every day | ORAL | Status: DC
  Administered 2021-12-17: 1000 ug via ORAL
  Filled 2021-12-16: qty 1

## 2021-12-16 MED ORDER — GABAPENTIN 300 MG PO CAPS
600.0000 mg | ORAL_CAPSULE | Freq: Three times a day (TID) | ORAL | Status: DC
  Administered 2021-12-16: 600 mg via ORAL
  Filled 2021-12-16: qty 2

## 2021-12-16 MED ORDER — LISINOPRIL 5 MG PO TABS
5.0000 mg | ORAL_TABLET | Freq: Every day | ORAL | Status: DC
Start: 2021-12-17 — End: 2021-12-17

## 2021-12-16 MED ORDER — METRONIDAZOLE 500 MG/100ML IV SOLN
500.0000 mg | Freq: Three times a day (TID) | INTRAVENOUS | Status: DC
  Administered 2021-12-16 – 2021-12-17 (×3): 500 mg via INTRAVENOUS
  Filled 2021-12-16 (×5): qty 100

## 2021-12-16 MED ORDER — DEXAMETHASONE SODIUM PHOSPHATE 10 MG/ML IJ SOLN
INTRAMUSCULAR | Status: AC
  Filled 2021-12-16: qty 1

## 2021-12-16 MED ORDER — ASPIRIN 81 MG PO CHEW
81.0000 mg | CHEWABLE_TABLET | Freq: Every day | ORAL | Status: DC
  Administered 2021-12-17: 81 mg via ORAL
  Filled 2021-12-16: qty 1

## 2021-12-16 MED ORDER — ATORVASTATIN CALCIUM 20 MG PO TABS
80.0000 mg | ORAL_TABLET | Freq: Every day | ORAL | Status: DC
  Administered 2021-12-17: 80 mg via ORAL
  Filled 2021-12-16: qty 4

## 2021-12-16 MED ORDER — ONDANSETRON HCL 4 MG/2ML IJ SOLN
INTRAMUSCULAR | Status: AC
  Filled 2021-12-16: qty 2

## 2021-12-16 MED ORDER — FENTANYL CITRATE (PF) 100 MCG/2ML IJ SOLN
INTRAMUSCULAR | Status: DC | PRN
  Administered 2021-12-16: 25 ug via INTRAVENOUS
  Administered 2021-12-16: 50 ug via INTRAVENOUS
  Administered 2021-12-16: 25 ug via INTRAVENOUS

## 2021-12-16 MED ORDER — SODIUM CHLORIDE 0.9 % IV SOLN
2.0000 g | Freq: Once | INTRAVENOUS | Status: DC
  Filled 2021-12-16: qty 2

## 2021-12-16 MED ORDER — MORPHINE SULFATE (PF) 2 MG/ML IV SOLN: 2.0000 mg | INTRAVENOUS | Status: DC | PRN

## 2021-12-16 MED ORDER — LACTATED RINGERS IV SOLN: INTRAVENOUS | Status: DC

## 2021-12-16 MED ORDER — HYDROCODONE-ACETAMINOPHEN 5-325 MG PO TABS
1.0000 | ORAL_TABLET | ORAL | Status: DC | PRN
  Filled 2021-12-16: qty 1

## 2021-12-16 MED ORDER — EPHEDRINE SULFATE (PRESSORS) 50 MG/ML IJ SOLN
INTRAMUSCULAR | Status: DC | PRN
Start: 2021-12-16 — End: 2021-12-16
  Administered 2021-12-16: 5 mg via INTRAVENOUS
  Administered 2021-12-16 (×2): 10 mg via INTRAVENOUS

## 2021-12-16 MED ORDER — VITAMIN D 25 MCG (1000 UNIT) PO TABS
2000.0000 [IU] | ORAL_TABLET | Freq: Every day | ORAL | Status: DC
  Administered 2021-12-17: 2000 [IU] via ORAL
  Filled 2021-12-16: qty 2

## 2021-12-16 MED ORDER — ROPINIROLE HCL 1 MG PO TABS
0.5000 mg | ORAL_TABLET | Freq: Every day | ORAL | Status: DC
  Administered 2021-12-16: 0.5 mg via ORAL
  Filled 2021-12-16: qty 2
  Filled 2021-12-16: qty 1

## 2021-12-16 MED ORDER — BUPROPION HCL ER (XL) 150 MG PO TB24
150.0000 mg | ORAL_TABLET | Freq: Every day | ORAL | Status: DC
  Administered 2021-12-17: 150 mg via ORAL
  Filled 2021-12-16: qty 1

## 2021-12-16 MED ORDER — GLYCOPYRROLATE 0.2 MG/ML IJ SOLN
INTRAMUSCULAR | Status: DC | PRN
  Administered 2021-12-16: .2 mg via INTRAVENOUS

## 2021-12-16 MED ORDER — SODIUM CHLORIDE 0.9 % IV SOLN: 2.0000 g | INTRAVENOUS | Status: DC

## 2021-12-16 MED ORDER — GLYCOPYRROLATE 0.2 MG/ML IJ SOLN
INTRAMUSCULAR | Status: AC
  Filled 2021-12-16: qty 1

## 2021-12-16 MED ORDER — SODIUM CHLORIDE 0.9 % IV SOLN
2.0000 g | INTRAVENOUS | Status: DC
  Administered 2021-12-16: 2 g via INTRAVENOUS
  Filled 2021-12-16: qty 20

## 2021-12-16 MED ORDER — VANCOMYCIN HCL IN DEXTROSE 1-5 GM/200ML-% IV SOLN
1000.0000 mg | Freq: Once | INTRAVENOUS | Status: AC
  Administered 2021-12-16: 1000 mg via INTRAVENOUS
  Filled 2021-12-16: qty 200

## 2021-12-16 MED ORDER — ONDANSETRON HCL 4 MG/2ML IJ SOLN
INTRAMUSCULAR | Status: DC | PRN
Start: 2021-12-16 — End: 2021-12-16
  Administered 2021-12-16: 4 mg via INTRAVENOUS

## 2021-12-16 MED ORDER — EPHEDRINE 5 MG/ML INJ
INTRAVENOUS | Status: AC
  Filled 2021-12-16: qty 5

## 2021-12-16 MED ORDER — INSULIN ASPART 100 UNIT/ML IJ SOLN
0.0000 [IU] | INTRAMUSCULAR | Status: DC
  Administered 2021-12-16: 5 [IU] via SUBCUTANEOUS
  Administered 2021-12-17: 3 [IU] via SUBCUTANEOUS
  Administered 2021-12-17 (×2): 5 [IU] via SUBCUTANEOUS
  Filled 2021-12-16 (×4): qty 1

## 2021-12-16 MED ORDER — ACETAMINOPHEN 650 MG RE SUPP: 650.0000 mg | Freq: Four times a day (QID) | RECTAL | Status: DC | PRN

## 2021-12-16 MED ORDER — QUETIAPINE FUMARATE 25 MG PO TABS
25.0000 mg | ORAL_TABLET | Freq: Every day | ORAL | Status: DC
  Administered 2021-12-16: 25 mg via ORAL
  Filled 2021-12-16: qty 1

## 2021-12-16 MED ORDER — ONDANSETRON HCL 4 MG PO TABS: 4.0000 mg | ORAL_TABLET | Freq: Four times a day (QID) | ORAL | Status: DC | PRN

## 2021-12-16 MED ORDER — PROPOFOL 10 MG/ML IV BOLUS
INTRAVENOUS | Status: DC | PRN
Start: 2021-12-16 — End: 2021-12-16
  Administered 2021-12-16: 200 mg via INTRAVENOUS

## 2021-12-16 MED ORDER — ALLOPURINOL 300 MG PO TABS
300.0000 mg | ORAL_TABLET | Freq: Every day | ORAL | Status: DC
  Administered 2021-12-17: 300 mg via ORAL
  Filled 2021-12-16: qty 1

## 2021-12-16 MED ORDER — VITAMIN D2 50 MCG (2000 UT) PO TABS: 2000.0000 [IU] | ORAL_TABLET | Freq: Every day | ORAL | Status: DC

## 2021-12-16 SURGICAL SUPPLY — 48 items
BLADE MED AGGRESSIVE (BLADE) ×2 IMPLANT
BLADE OSC/SAGITTAL MD 5.5X18 (BLADE) ×1 IMPLANT
BLADE SURG 15 STRL LF DISP TIS (BLADE) ×2 IMPLANT
BLADE SURG 15 STRL SS (BLADE) ×2
BLADE SURG MINI STRL (BLADE) ×2 IMPLANT
BNDG CONFORM 2 STRL LF (GAUZE/BANDAGES/DRESSINGS) ×1 IMPLANT
BNDG CONFORM 3 STRL LF (GAUZE/BANDAGES/DRESSINGS) ×1 IMPLANT
BNDG ELASTIC 4X5.8 VLCR STR LF (GAUZE/BANDAGES/DRESSINGS) ×1 IMPLANT
BNDG ESMARK 4X12 TAN STRL LF (GAUZE/BANDAGES/DRESSINGS) ×1 IMPLANT
BNDG GAUZE ELAST 4 BULKY (GAUZE/BANDAGES/DRESSINGS) ×1 IMPLANT
CUFF TOURN SGL QUICK 12 (TOURNIQUET CUFF) ×1 IMPLANT
CUFF TOURN SGL QUICK 18X4 (TOURNIQUET CUFF) ×1 IMPLANT
DRAPE FLUOR MINI C-ARM 54X84 (DRAPES) ×1 IMPLANT
DURAPREP 26ML APPLICATOR (WOUND CARE) ×2 IMPLANT
ELECT REM PT RETURN 9FT ADLT (ELECTROSURGICAL) ×2
ELECTRODE REM PT RTRN 9FT ADLT (ELECTROSURGICAL) ×1 IMPLANT
GAUZE SPONGE 4X4 12PLY STRL (GAUZE/BANDAGES/DRESSINGS) ×2 IMPLANT
GAUZE XEROFORM 1X8 LF (GAUZE/BANDAGES/DRESSINGS) ×2 IMPLANT
GLOVE SURG ENC MOIS LTX SZ7.5 (GLOVE) ×2 IMPLANT
GLOVE SURG UNDER LTX SZ8 (GLOVE) ×2 IMPLANT
GOWN STRL REUS W/ TWL LRG LVL3 (GOWN DISPOSABLE) ×2 IMPLANT
GOWN STRL REUS W/TWL LRG LVL3 (GOWN DISPOSABLE) ×2
HANDPIECE VERSAJET DEBRIDEMENT (MISCELLANEOUS) ×1 IMPLANT
IV NS IRRIG 3000ML ARTHROMATIC (IV SOLUTION) ×1 IMPLANT
KIT TURNOVER KIT A (KITS) ×2 IMPLANT
LABEL OR SOLS (LABEL) ×1 IMPLANT
MANIFOLD NEPTUNE II (INSTRUMENTS) ×2 IMPLANT
NDL FILTER BLUNT 18X1 1/2 (NEEDLE) ×1 IMPLANT
NDL HYPO 25X1 1.5 SAFETY (NEEDLE) ×2 IMPLANT
NEEDLE FILTER BLUNT 18X 1/2SAF (NEEDLE) ×1
NEEDLE FILTER BLUNT 18X1 1/2 (NEEDLE) ×1 IMPLANT
NEEDLE HYPO 25X1 1.5 SAFETY (NEEDLE) ×4 IMPLANT
NS IRRIG 500ML POUR BTL (IV SOLUTION) ×2 IMPLANT
PACK EXTREMITY ARMC (MISCELLANEOUS) ×2 IMPLANT
PAD ABD DERMACEA PRESS 5X9 (GAUZE/BANDAGES/DRESSINGS) ×4 IMPLANT
SOL PREP PVP 2OZ (MISCELLANEOUS) ×2
SOLUTION PREP PVP 2OZ (MISCELLANEOUS) ×1 IMPLANT
STOCKINETTE BIAS CUT 4 980044 (GAUZE/BANDAGES/DRESSINGS) ×2 IMPLANT
STOCKINETTE STRL 6IN 960660 (GAUZE/BANDAGES/DRESSINGS) ×2 IMPLANT
STRIP CLOSURE SKIN 1/4X4 (GAUZE/BANDAGES/DRESSINGS) ×1 IMPLANT
SUT ETHILON 3-0 FS-10 30 BLK (SUTURE) ×2
SUT ETHILON 4-0 (SUTURE)
SUT ETHILON 4-0 FS2 18XMFL BLK (SUTURE)
SUTURE EHLN 3-0 FS-10 30 BLK (SUTURE) ×1 IMPLANT
SUTURE ETHLN 4-0 FS2 18XMF BLK (SUTURE) IMPLANT
SWAB CULTURE AMIES ANAERIB BLU (MISCELLANEOUS) ×1 IMPLANT
SYR 10ML LL (SYRINGE) ×2 IMPLANT
WATER STERILE IRR 500ML POUR (IV SOLUTION) ×1 IMPLANT

## 2021-12-16 NOTE — H&P (Signed)
History and Physical    Patient: [name] TKP:[id number] DOB: July 15, 1956 DOA: 12/16/2021 DOS: the patient was seen and examined on 12/16/2021 PCP: Alm Bustard, NP  Patient coming from: Home  Chief Complaint:  Chief Complaint  Patient presents with   Wound Infection    Left Big Toe     HPI: [name] is a 66 y.o. male with medical history significant for diabetes mellitus, gout, hypertension, history of CVA with left-sided weakness who was sent to the emergency room from the podiatrist office for evaluation of her left great toe ulcer. Patient states that he bought a pair of new diabetic shoes and that 2 weeks ago he noticed that he had developed an ulcer over his left great toe from pressure. He has had bloody drainage from that wound and increased redness over his left great toe. He was placed on antibiotics after being seen at the wound care clinic without any improvement in the wound. He had a recent x-ray which showed changes consistent with osteomyelitis as well as fracture of the distal phalanx of the hallux. He has had no fever, no chills, no chest pain, no shortness of breath, no headache, no dizziness, no lightheadedness, no abdominal pain, no urinary symptoms, no changes in his bowel habits, no focal deficit or blurred vision.  Review of Systems: As mentioned in the history of present illness. All other systems reviewed and are negative. Past Medical History:  Diagnosis Date   Diabetes mellitus without complication (HCC)    Gout    Past Surgical History:  Procedure Laterality Date   LAPAROSCOPIC GASTRIC BANDING     Social History:  reports that he has never smoked. He has never used smokeless tobacco. He reports that he does not drink alcohol and does not use drugs.  No Known Allergies  History reviewed. No pertinent family history.  Prior to Admission medications   Medication Sig Start Date End Date Taking? Authorizing Provider  allopurinol (ZYLOPRIM)  300 MG tablet Take 300 mg by mouth daily.   Yes [provider]  atorvastatin (LIPITOR) 80 MG tablet Take 1 tablet (80 mg total) by mouth daily. 12/07/21 03/07/22 Yes Darlin Priestly, MD  buPROPion (WELLBUTRIN XL) 150 MG 24 hr tablet Take 150 mg by mouth daily.   Yes [provider]  clopidogrel (PLAVIX) 75 MG tablet Take 1 tablet (75 mg total) by mouth daily for 21 days. 12/08/21 12/29/21 Yes Darlin Priestly, MD  empagliflozin (JARDIANCE) 25 MG TABS tablet Take 25 mg by mouth daily.   Yes [provider]  Ergocalciferol (VITAMIN D2) 50 MCG (2000 UT) TABS Take 2,000 Units by mouth daily.   Yes [provider]  gabapentin (NEURONTIN) 300 MG capsule Take 600 mg by mouth 3 (three) times daily.   Yes [provider]  lisinopril (ZESTRIL) 10 MG tablet Take 5 mg by mouth daily.   Yes [provider]  magnesium oxide (MAG-OX) 400 MG tablet Take 400 mg by mouth daily.   Yes [provider]  metFORMIN (GLUCOPHAGE) 500 MG tablet Take 1,000 mg by mouth 2 (two) times daily with a meal.   Yes [provider]  QUEtiapine (SEROQUEL) 25 MG tablet Take 25 mg by mouth at bedtime.   Yes [provider]  rOPINIRole (REQUIP) 0.5 MG tablet Take 0.5 mg by mouth at bedtime.   Yes [provider]  Semaglutide 7 MG TABS Take 7 mg by mouth daily.   Yes [provider]  vitamin B-12 (  CYANOCOBALAMIN) 1000 MCG tablet Take 1,000 mcg by mouth daily.   Yes [provider]  aspirin 81 MG chewable tablet Chew 81 mg by mouth daily.    [provider]    Physical Exam: Vitals:   12/16/21 1046 12/16/21 1048 12/16/21 1239  BP:  112/77 111/74  Pulse:  79 60  Resp:  18 15  Temp:  97.7 F (36.5 C)   TempSrc:  Oral   SpO2:  99% 100%  Weight: 104 kg    Height: 6\' 2"  (1.88 m)     Physical Exam Vitals and nursing note reviewed.  Constitutional:      Appearance: Normal appearance. He is normal weight.  HENT:     Head: Normocephalic  and atraumatic.     Nose: Nose normal.     Mouth/Throat:     Mouth: Mucous membranes are moist.  Eyes:     Pupils: Pupils are equal, round, and reactive to light.  Cardiovascular:     Rate and Rhythm: Normal rate and regular rhythm.  Pulmonary:     Effort: Pulmonary effort is normal.  Abdominal:     General: Abdomen is flat. Bowel sounds are normal.     Palpations: Abdomen is soft.  Musculoskeletal:     Cervical back: Normal range of motion.     Comments: Redness over left great toe with differential warmth and ulceration over the top of the left great toe with no purulence  Skin:    General: Skin is warm and dry.  Neurological:     General: No focal deficit present.     Mental Status: He is alert and oriented to person, place, and time.  Psychiatric:        Mood and Affect: Mood normal.        Behavior: Behavior normal.     Data Reviewed: Notes from primary care and specialist visits, past discharge summaries. Prior diagnostic testing as applicable to current admission diagnoses Updated medications and problem lists for reconciliation ED course, including vitals, labs, imaging, treatment and response to treatment Triage notes and ED providers notes Lactic acid 3.6, sed rate elevated at 56, serum creatinine of 1.37, glucose 149 Left foot x-ray shows destructive changes involving the great toe distal phalanx consistent with osteomyelitis. There is a transverse fracture through the distal phalanx which may be pathologic. Vague increased density in the soft tissues subjacent to the dressing may represent tissue wound or overlying artifact related to dressing. 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  There are no new results to review at this time.  Assessment and Plan: Principal Problem:   Osteomyelitis of ankle or foot, acute, left (HCC) Active Problems:   CVA (cerebral vascular accident) (HCC)   Diabetes mellitus type 2, uncomplicated  (HCC)   Gout   Lactic acidosis    Osteomyelitis of left great toe We will place patient empirically on antibiotic therapy with IV vancomycin, metronidazole and Rocephin Podiatry consult for definitive treatment which includes amputation of the left great toe    Diabetes mellitus Hold oral hypoglycemic agents Glycemic control with sliding scale insulin    History of CVA with left-sided hemiparesis Continue aspirin and high intensity statin and Hold Plavix for planned procedure but resume once cleared by podiatry    Hypertension Continue lisinopril    Depression Continue Wellbutrin    History of gout Continue allopurinol    Lactic acidosis Secondary to metformin use No evidence of sepsis at this time  Advance Care Planning:   Code Status: Full Code   Consults: Podiatry  Family Communication: Greater than 50% of time was spent discussing patient's condition and plan of care with him at the bedside.  All questions and concerns have been addressed.  He verbalizes understanding and agrees with the plan.  Severity of Illness: The appropriate patient status for this patient is INPATIENT. Inpatient status is judged to be reasonable and necessary in order to provide the required intensity of service to ensure the patient's safety. The patient's presenting symptoms, physical exam findings, and initial radiographic and laboratory data in the context of their chronic comorbidities is felt to place them at high risk for further clinical deterioration. Furthermore, it is not anticipated that the patient will be medically stable for discharge from the hospital within 2 midnights of admission.   * I certify that at the point of admission it is my clinical judgment that the patient will require inpatient hospital care spanning beyond 2 midnights from the point of admission due to high intensity of service, high risk for further deterioration and high frequency of surveillance  required.*  Author: Lucile Shutters, MD 12/16/2021 1:46 PM  For on call review [email or web address].

## 2021-12-16 NOTE — ED Notes (Signed)
1 set of blood cx's, SST, & covid swab sent to lab.

## 2021-12-16 NOTE — Anesthesia Preprocedure Evaluation (Signed)
Anesthesia Evaluation  Patient identified by MRN, date of birth, ID band Patient awake    Reviewed: Allergy & Precautions, H&P , NPO status , Patient's Chart, lab work & pertinent test results, reviewed documented beta blocker date and time   History of Anesthesia Complications Negative for: history of anesthetic complications  Airway Mallampati: I  TM Distance: >3 FB Neck ROM: full    Dental  (+) Dental Advidsory Given, Poor Dentition   Pulmonary neg pulmonary ROS,    Pulmonary exam normal breath sounds clear to auscultation       Cardiovascular Exercise Tolerance: Good negative cardio ROS Normal cardiovascular exam Rhythm:regular Rate:Normal     Neuro/Psych neg Seizures CVA, Residual Symptoms negative psych ROS   GI/Hepatic negative GI ROS, Neg liver ROS,   Endo/Other  diabetes, Type 2, Oral Hypoglycemic Agents  Renal/GU negative Renal ROS  negative genitourinary   Musculoskeletal   Abdominal   Peds  Hematology negative hematology ROS (+)   Anesthesia Other Findings Past Medical History: No date: Diabetes mellitus without complication (HCC) No date: Gout   Reproductive/Obstetrics negative OB ROS                             Anesthesia Physical Anesthesia Plan  ASA: 3  Anesthesia Plan: General   Post-op Pain Management:    Induction: Intravenous  PONV Risk Score and Plan: 2 and Ondansetron and Treatment may vary due to age or medical condition  Airway Management Planned: LMA  Additional Equipment:   Intra-op Plan:   Post-operative Plan: Extubation in OR  Informed Consent: I have reviewed the patients History and Physical, chart, labs and discussed the procedure including the risks, benefits and alternatives for the proposed anesthesia with the patient or authorized representative who has indicated his/her understanding and acceptance.     Dental Advisory Given  Plan  Discussed with: Anesthesiologist, CRNA and Surgeon  Anesthesia Plan Comments:         Anesthesia Quick Evaluation

## 2021-12-16 NOTE — Transfer of Care (Signed)
Immediate Anesthesia Transfer of Care Note  Patient: [name]  Procedure(s) Performed: AMPUTATION TOE - Left Great (Left: Toe)  Patient Location: PACU  Anesthesia Type:General  Level of Consciousness: awake, alert  and oriented  Airway & Oxygen Therapy: Patient Spontanous Breathing  Post-op Assessment: Report given to RN and Post -op Vital signs reviewed and stable  Post vital signs: Reviewed and stable  Last Vitals:  Vitals Value Taken Time  BP 118/70 12/16/21 1800  Temp    Pulse 96 12/16/21 1804  Resp 17 12/16/21 1804  SpO2 97 % 12/16/21 1804  Vitals shown include unvalidated device data.  Last Pain:  Vitals:   12/16/21 1705  TempSrc: Temporal  PainSc: 0-No pain         Complications: No notable events documented.

## 2021-12-16 NOTE — Progress Notes (Signed)
Patient is bleeding through dressing in PACU serosanguineous shadowing noted on dressing, Dr. Alberteen Spindle assessed at baseline and reinforced dressing. He applied mepilex to his right heel ulcer. LLE is elevated on pillows, and post-op shoe given to patient. Denies pain at this time. VSS.

## 2021-12-16 NOTE — Anesthesia Postprocedure Evaluation (Signed)
Anesthesia Post Note  Patient: [name]  Procedure(s) Performed: AMPUTATION TOE - Left Great (Left: Toe)  Patient location during evaluation: PACU Anesthesia Type: General Level of consciousness: awake and alert Pain management: pain level controlled Vital Signs Assessment: post-procedure vital signs reviewed and stable Respiratory status: spontaneous breathing, nonlabored ventilation, respiratory function stable and patient connected to nasal cannula oxygen Cardiovascular status: blood pressure returned to baseline and stable Postop Assessment: no apparent nausea or vomiting Anesthetic complications: no   No notable events documented.   Last Vitals:  Vitals:   12/16/21 1830 12/16/21 1851  BP:  126/68  Pulse:  74  Resp:  18  Temp: (!) 36.2 C   SpO2:  100%    Last Pain:  Vitals:   12/16/21 1847  TempSrc:   PainSc: 0-No pain                 Lenard [name]

## 2021-12-16 NOTE — Consult Note (Signed)
Reason for Consult: Osteomyelitis with fracture left hallux. Referring Physician: Theophile Harvie is an 66 y.o. male.  HPI: This is a 66 year old male with recent history over the last few weeks of ulceration on his left great toe.  Has been followed by the wound care center.  Referred back to my office today for evaluation due to worsening of the condition of the toe.  Patient was found to have x-ray changes consistent with osteomyelitis as well as a fracture of the distal phalanx of the hallux.  Discussed with the patient the need for amputation of the left great toe and was sent to the emergency department for admission and definitive amputation.  Past Medical History:  Diagnosis Date   Diabetes mellitus without complication (HCC)    Gout     Past Surgical History:  Procedure Laterality Date   LAPAROSCOPIC GASTRIC BANDING      History reviewed. No pertinent family history.  Social History:  reports that he has never smoked. He has never used smokeless tobacco. He reports that he does not drink alcohol and does not use drugs.  Allergies: No Known Allergies  Medications: Scheduled:  Results for orders placed or performed during the hospital encounter of 12/16/21 (from the past 48 hour(s))  Lactic acid, plasma     Status: Abnormal   Collection Time: 12/16/21 10:49 AM  Result Value Ref Range   Lactic Acid, Venous 3.6 (HH) 0.5 - 1.9 mmol/L    Comment: CRITICAL RESULT CALLED TO, READ BACK BY AND VERIFIED WITH LINDA [facility] RN @1140  12/16/21 SCS Performed at [facility], [address], [place], [place]   Comprehensive metabolic panel     Status: Abnormal   Collection Time: 12/16/21 10:49 AM  Result Value Ref Range   Sodium 135 135 - 145 mmol/L   Potassium 4.4 3.5 - 5.1 mmol/L   Chloride 99 98 - 111 mmol/L   CO2 24 22 - 32 mmol/L   Glucose, Bld 149 (H) 70 - 99 mg/dL    Comment: Glucose reference range applies only to samples taken after fasting for at  least 8 hours.   BUN 17 8 - 23 mg/dL   Creatinine, Ser 12/18/21 (H) 0.61 - 1.24 mg/dL   Calcium 9.9 8.9 - 9.03 mg/dL   Total Protein 9.1 (H) 6.5 - 8.1 g/dL   Albumin 4.2 3.5 - 5.0 g/dL   AST 20 15 - 41 U/L   ALT 13 0 - 44 U/L   Alkaline Phosphatase 83 38 - 126 U/L   Total Bilirubin 0.6 0.3 - 1.2 mg/dL   GFR, Estimated 57 (L) >60 mL/min    Comment: (NOTE) Calculated using the CKD-EPI Creatinine Equation (2021)    Anion gap 12 5 - 15    Comment: Performed at [facility], [address] [place], [place]  CBC with Differential     Status: None   Collection Time: 12/16/21 10:49 AM  Result Value Ref Range   WBC 7.9 4.0 - 10.5 K/uL   RBC 4.70 4.22 - 5.81 MIL/uL   Hemoglobin 13.6 13.0 - 17.0 g/dL   HCT 12/18/21 76.2 - 26.3 %   MCV 93.8 80.0 - 100.0 fL   MCH 28.9 26.0 - 34.0 pg   MCHC 30.8 30.0 - 36.0 g/dL   RDW 33.5 45.6 - 25.6 %   Platelets 304 150 - 400 K/uL   nRBC 0.0 0.0 - 0.2 %   Neutrophils Relative % 64 %  Neutro Abs 4.9 1.7 - 7.7 K/uL   Lymphocytes Relative 26 %   Lymphs Abs 2.1 0.7 - 4.0 K/uL   Monocytes Relative 4 %   Monocytes Absolute 0.3 0.1 - 1.0 K/uL   Eosinophils Relative 5 %   Eosinophils Absolute 0.4 0.0 - 0.5 K/uL   Basophils Relative 1 %   Basophils Absolute 0.1 0.0 - 0.1 K/uL   Immature Granulocytes 0 %   Abs Immature Granulocytes 0.02 0.00 - 0.07 K/uL    Comment: Performed at [facility], [address] [place], [place] [address]  Sedimentation rate     Status: Abnormal   Collection Time: 12/16/21 10:49 AM  Result Value Ref Range   Sed Rate 56 (H) 0 - 20 mm/hr    Comment: Performed at [facility], [address] [place], [place] [address]  Urinalysis, Routine w reflex microscopic Urine, Clean Catch     Status: Abnormal   Collection Time: 12/16/21 11:10 AM  Result Value Ref Range   Color, Urine YELLOW (A) YELLOW   APPearance CLEAR (A) CLEAR   Specific Gravity, Urine 1.021 1.005 - 1.030   pH 5.0 5.0 - 8.0    Glucose, UA >=500 (A) NEGATIVE mg/dL   Hgb urine dipstick NEGATIVE NEGATIVE   Bilirubin Urine NEGATIVE NEGATIVE   Ketones, ur NEGATIVE NEGATIVE mg/dL   Protein, ur NEGATIVE NEGATIVE mg/dL   Nitrite NEGATIVE NEGATIVE   Leukocytes,Ua NEGATIVE NEGATIVE   WBC, UA 0-5 0 - 5 WBC/hpf   Bacteria, UA RARE (A) NONE SEEN   Squamous Epithelial / LPF NONE SEEN 0 - 5   Mucus PRESENT     Comment: Performed at [facility], [address], [place], [place] [address]  Resp Panel by RT-PCR (Flu A&B, Covid) Nasopharyngeal Swab     Status: None   Collection Time: 12/16/21 11:38 AM   Specimen: Nasopharyngeal Swab; Nasopharyngeal(NP) swabs in vial transport medium  Result Value Ref Range   SARS Coronavirus 2 by RT PCR NEGATIVE NEGATIVE    Comment: (NOTE) SARS-CoV-2 target nucleic acids are NOT DETECTED.  The SARS-CoV-2 RNA is generally detectable in upper respiratory specimens during the acute phase of infection. The lowest concentration of SARS-CoV-2 viral copies this assay can detect is 138 copies/mL. A negative result does not preclude SARS-Cov-2 infection and should not be used as the sole basis for treatment or other patient management decisions. A negative result may occur with  improper specimen collection/handling, submission of specimen other than nasopharyngeal swab, presence of viral mutation(s) within the areas targeted by this assay, and inadequate number of viral copies(<138 copies/mL). A negative result must be combined with clinical observations, patient history, and epidemiological information. The expected result is Negative.  Fact Sheet for Patients:  BloggerCourse.com  Fact Sheet for Healthcare Providers:  SeriousBroker.it  This test is no t yet approved or cleared by the [place] FDA and  has been authorized for detection and/or diagnosis of SARS-CoV-2 by FDA under an Emergency Use Authorization (EUA). This EUA  will remain  in effect (meaning this test can be used) for the duration of the COVID-19 declaration under Section 564(b)(1) of the Act, 21 U.S.C.section 360bbb-3(b)(1), unless the authorization is terminated  or revoked sooner.       Influenza A by PCR NEGATIVE NEGATIVE   Influenza B by PCR NEGATIVE NEGATIVE    Comment: (NOTE) The Xpert Xpress SARS-CoV-2/FLU/RSV plus assay is intended as an aid in the diagnosis of influenza from Nasopharyngeal swab specimens  and should not be used as a sole basis for treatment. Nasal washings and aspirates are unacceptable for Xpert Xpress SARS-CoV-2/FLU/RSV testing.  Fact Sheet for Patients: BloggerCourse.com  Fact Sheet for Healthcare Providers: SeriousBroker.it  This test is not yet approved or cleared by the [place] FDA and has been authorized for detection and/or diagnosis of SARS-CoV-2 by FDA under an Emergency Use Authorization (EUA). This EUA will remain in effect (meaning this test can be used) for the duration of the COVID-19 declaration under Section 564(b)(1) of the Act, 21 U.S.C. section 360bbb-3(b)(1), unless the authorization is terminated or revoked.  Performed at [facility], [address] [place], [place] [address]     DG Foot Complete Left  Result Date: 12/15/2021 CLINICAL DATA:  Non-healing wound left great toe. EXAM: LEFT FOOT - COMPLETE 3+ VIEW COMPARISON:  None. FINDINGS: Destructive changes involving the great toe distal phalanx. Transverse lucency consistent with fracture with mild displacement, possibly pathologic. A dressing overlies the great toe, vague increased density in the soft tissues subjacent to the dressing. No other findings suspicious for osteomyelitis. 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 Moderate plantar calcaneal spur and Achilles tendon enthesophyte. No soft tissue gas. IMPRESSION: 1. Destructive  changes involving the great toe distal phalanx consistent with osteomyelitis. There is a transverse fracture through the distal phalanx which may be pathologic. Vague increased density in the soft tissues subjacent to the dressing may represent tissue wound or overlying artifact related to dressing. 2. 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 Electronically Signed   By: Narda Rutherford M.D.   On: 12/15/2021 13:07    Review of Systems  Constitutional:  Negative for chills and fever.  HENT:  Negative for sinus pain and sore throat.   Respiratory:  Negative for cough and shortness of breath.   Cardiovascular:  Negative for chest pain and palpitations.  Gastrointestinal:  Negative for nausea and vomiting.  Genitourinary:  Negative for frequency and urgency.  Musculoskeletal:  Negative for arthralgias and myalgias.  Skin:        Patient relates an ulcer on his left great toe for the last few weeks with some increasing swelling and redness.  Has been followed by the wound care center but referred back over for evaluation  Neurological:        Patient relates neuropathy associated with his diabetes.  Psychiatric/Behavioral:  Negative for behavioral problems. The patient is not nervous/anxious.   Blood pressure 111/74, pulse 60, temperature 97.7 F (36.5 C), temperature source Oral, resp. rate 15, height 6\' 2"  (1.88 m), weight 104 kg, SpO2 100 %. Physical Exam Cardiovascular:     Comments: DP and PT pulses are fully palpable. Musculoskeletal:     Comments: Adequate range of motion of the pedal joints.  Digital contractures noted in the toes.  X-rays taken in the office today reveal fracture in the distal phalanx of the left hallux with cortical erosions distally consistent with osteomyelitis underlying his ulceration.    Skin:    Comments: The skin is warm dry and mildly atrophic.  Significant erythema and edema noted in the left hallux with full-thickness ulceration  distally measuring approximately 20 mm x 8 mm with 5 mm depth with central necrotic devitalized tissue.  Probes to the level of bone.  Superficial ulceration is noted beneath the right first metatarsal head with granular base.  Neurological:     Comments: Loss of protective threshold bilateral with monofilament wire in the  forefoot and toes.    Assessment/Plan: Assessment: 1.  Osteomyelitis left hallux. 2.  Displaced fracture distal phalanx left hallux. 3.  Diabetes with associated neuropathy. 4.  Superficial ulceration right forefoot.  Plan: Discussed with the patient the infection and fracture in his left great toe and the need for amputation of the toe.  Discussed with the patient possible risks and complications of the procedure and anesthesia including but not limited to inability of the wound to heal due to infection, his diabetes, or vascular status.  No guarantees can be given as to the outcome.  Questions invited and answered.  Patient elects to proceed with amputation for later this afternoon.  Patient currently n.p.o. and last ate yesterday.  Obtain consent for amputation left great toe.  Plan is for surgery later this afternoon  Ricci Barkerodd W Latavia Goga 12/16/2021, 12:56 PM

## 2021-12-16 NOTE — Consult Note (Signed)
PHARMACY -  BRIEF ANTIBIOTIC NOTE   Pharmacy has received consult(s) for cefepime and vancomycin from an ED provider.  The patient's profile has been reviewed for ht/wt/allergies/indication/available labs.    Indication OM  One time order(s) placed for  --Vancomycin 1 g IV  --Cefepime 2 g IV  Further antibiotics/pharmacy consults should be ordered by admitting physician if indicated.                       Thank you, Tressie Ellis 12/16/2021  12:34 PM

## 2021-12-16 NOTE — ED Provider Notes (Signed)
[facility] Provider Note    Event Date/Time   First MD Initiated Contact with Patient 12/16/21 1106     (approximate)   History   Wound Infection (Left Big Toe )   HPI  Vi Whitesel is a 66 y.o. 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  Patient states he has had an ulcer and infection there for weeks but was seen earlier today and told to come to emergency room because he needs to have it amputated.  He states he has a healing ulcer on the base of the right foot just but this has been getting better.  He does not recall any injuries to the left side.  No fevers, chills, headache, sore throat, chest pain, cough, vomiting, diarrhea, rash or any other sick symptoms.  No other acute concerns at this time.    Past Medical History:  Diagnosis Date   Diabetes mellitus without complication ([place])    Gout          Physical Exam  Triage Vital Signs: ED Triage Vitals  Enc Vitals Group     BP 12/16/21 1048 112/77     Pulse Rate 12/16/21 1048 79     Resp 12/16/21 1048 18     Temp 12/16/21 1048 97.7 F (36.5 C)     Temp Source 12/16/21 1048 Oral     SpO2 12/16/21 1048 99 %     Weight 12/16/21 1046 229 lb 4.5 oz (104 kg)     Height 12/16/21 1046 6' 2"  (1.88 m)     Head Circumference --      Peak Flow --      Pain Score 12/16/21 1046 5     Pain Loc --      Pain Edu? --      Excl. in [place]? --     Most recent vital signs: Vitals:   12/16/21 1048  BP: 112/77  Pulse: 79  Resp: 18  Temp: 97.7 F (36.5 C)  SpO2: 99%    General: Awake, no distress.  CV:  Good peripheral perfusion.  Resp:  Normal effort.  Abd:  No distention.  Other:  Necrotic appearing left toe with ulceration and edema.  2+ DP pulses.  Patient is able to move his other  toes.  There is no spread of the edema and induration over the dorsum or remainder of the foot.  Ankle is unremarkable.  On the right foot there is a small well-appearing noninfected appearing ulcer at the base of the right foot.   ED Results / Procedures / Treatments  Labs (all labs ordered are listed, but only abnormal results are displayed) Labs Reviewed  LACTIC ACID, PLASMA - Abnormal; Notable for the following components:      Result Value   Lactic Acid, Venous 3.6 (*)    All other components within normal limits  COMPREHENSIVE METABOLIC PANEL - Abnormal; Notable for the following components:   Glucose, Bld 149 (*)    Creatinine, Ser 1.37 (*)    Total Protein 9.1 (*)    GFR, Estimated 57 (*)    All other components within normal limits  URINALYSIS, ROUTINE W REFLEX MICROSCOPIC - Abnormal; Notable for the following components:   Color, Urine YELLOW (*)    APPearance CLEAR (*)  Glucose, UA >=500 (*)    Bacteria, UA RARE (*)    All other components within normal limits  SEDIMENTATION RATE - Abnormal; Notable for the following components:   Sed Rate 56 (*)    All other components within normal limits  RESP PANEL BY RT-PCR (FLU A&B, COVID) ARPGX2  CULTURE, BLOOD (ROUTINE X 2)  CULTURE, BLOOD (ROUTINE X 2)  CBC WITH DIFFERENTIAL/PLATELET  LACTIC ACID, PLASMA  C-REACTIVE PROTEIN     EKG    RADIOLOGY  Plain films of the left foot obtained yesterday interpreted by myself shows obstruction of the distal phalanx of the left first toe concerning for osteomyelitis.  Also reviewed radiology interpretation and agree with the findings of a transverse fracture of the distal phalanx and the overall findings concerning for osteomyelitis.   PROCEDURES:  Critical Care performed: No  Procedures    MEDICATIONS ORDERED IN ED: Medications  ceFEPIme (MAXIPIME) 2 g in sodium chloride 0.9 % 100 mL IVPB (has no administration in time range)  vancomycin (VANCOCIN) IVPB 1000 mg/200 mL  premix (has no administration in time range)  lactated ringers bolus 1,000 mL (1,000 mLs Intravenous New Bag/Given 12/16/21 1139)     IMPRESSION / MDM / ASSESSMENT AND PLAN / ED COURSE  I reviewed the triage vital signs and the nursing notes.                              Differential diagnosis includes, but is not limited to osteomyelitis, cellulitis and gangrene.  Patient does not appear septic and have a low suspicion for rapidly progressing necrotizing infection at this time.  Plain films of the left foot obtained yesterday interpreted by myself shows obstruction of the distal phalanx of the left first toe concerning for osteomyelitis.  Also reviewed radiology interpretation and agree with the findings of a transverse fracture of the distal phalanx and the overall findings concerning for osteomyelitis.  CBC without leukocytosis or acute anemia.  CMP shows a glucose of 149 and a creatinine of 1.34 without any other significant electrolyte or metabolic derangements.  ESR is elevated 56.  Lactic acid elevated at 3.6 although patient is otherwise totally hemodynamically stable with no fever or leukocytosis and clinically she is septic.  UA shows some glucose and rare bacteria but no other evidence of infection.  Blood cultures and CRP sent.  Patient started on broad-spectrum antibiotics for osteomyelitis.  Discussed patient with his podiatrist Dr. Caryl Comes who recommends hospital admission for likely amputation later today.  Discussed this with hospitalist who will place admission orders.    FINAL CLINICAL IMPRESSION(S) / ED DIAGNOSES   Final diagnoses:  Osteomyelitis of left foot, unspecified type ([place])     Rx / DC Orders   ED Discharge Orders     None        Note:  This document was prepared using Dragon voice recognition software and may include unintentional dictation errors.   Lucrezia Starch, MD 12/16/21 1233

## 2021-12-16 NOTE — ED Triage Notes (Addendum)
Pt to ED via POV from podiatrist office for amputation of left toe due to infection. Pt sees Dr. Alberteen Spindle. Pt has had infection x2-3wks. VSS. Pt reports no fever at home.   Pt also on Plavix from stroke a month ago with some residual left sided deficits.

## 2021-12-16 NOTE — ED Notes (Signed)
1st set of blood cultures sent to lab - order not placed at this time

## 2021-12-16 NOTE — Consult Note (Signed)
Pharmacy Antibiotic Note  [name] is a 66 y.o. male with medical history including diabetes mellitus admitted on 12/16/2021 with  osteomyelitis of left great toe .  Pharmacy has been consulted for vancomycin dosing. Patient is also ordered ceftriaxone and metronidazole.  Podiatry consulted, plan for left great toe amputation  Plan:  Vancomycin 2 g IV LD followed by maintenance regimen of vancomycin 1 g IV q12h --Calculated AUC: 486, Cmin 15.1 --Daily Scr per protocol --Levels at steady state as clinically indicated  Height: 6\' 2"  (188 cm) Weight: 104 kg (229 lb 4.5 oz) IBW/kg (Calculated) : 82.2  Temp (24hrs), Avg:97.7 F (36.5 C), Min:97.7 F (36.5 C), Max:97.7 F (36.5 C)  Recent Labs  Lab 12/16/21 1049  WBC 7.9  CREATININE 1.37*  LATICACIDVEN 3.6*    Estimated Creatinine Clearance: 67.3 mL/min (A) (by C-G formula based on SCr of 1.37 mg/dL (H)).    No Known Allergies  Antimicrobials this admission: Vancomycin 2/16 >>  Ceftriaxone 2/16 >>  Metronidazole 2/16 >>   Dose adjustments this admission: N/A  Microbiology results: 2/16 BCx: pending 2/16 MRSA PCR: pending  Thank you for allowing pharmacy to be a part of this patients care.  3/16 12/16/2021 2:06 PM

## 2021-12-16 NOTE — Op Note (Signed)
Date of operation: 12/16/2021.  Surgeon: Durward Fortes D.P.M.  Preoperative diagnosis: Osteomyelitis with fracture left hallux.  Postoperative diagnosis: Same.  Procedure: Partial amputation left great toe.  Anesthesia: LMA with local infiltration.  Hemostasis: Esmarch tourniquet left ankle.  Estimated blood loss: Less than 5 cc.  Cultures: Bone culture distal phalanx left hallux.  Pathology: Left hallux.  Complications: None apparent.  Operative indications: This is a 66 year old male with several week history of ulceration on his left great toe.  Seen outpatient today and x-rays revealed osteomyelitis distal phalanx on the hallux as well as a fracture.  Decision was made for hospitalization for amputation of the toe.  Operative procedure: Patient was taken to the operating room and placed on the table in the supine position.  Following satisfactory LMA anesthesia the left forefoot was anesthetized with 10 cc of 0.5% Marcaine plain.  The foot was then prepped and draped in the usual sterile fashion.  The foot was exsanguinated with an Esmarch which was left intact around the left ankle as a tourniquet.  Attention directed to the left hallux where a fishmouth type incision was made coursing from medial to lateral around the dorsal and plantar aspect of the distal hallux.  Incision was carried sharply down to the level of the bone with dissection carried back to the level of the joint where the toe was disarticulated and removed in toto.  Sample of bone taken for culture and the toe was passed off for pathology.  There was noted to be some tightness to skin closure so the head of the proximal phalanx was then resected using a sagittal saw.  Good closure of the skin margins was allowed with no tension.  The wound was flushed with copious amounts of sterile saline and closed using 3-0 nylon vertical mattress and simple interrupted sutures.  Xeroform 4 x 4's conformer and Kerlix applied to the left  foot followed by an Ace wrap.  Patient was awakened and transported the PACU having tolerated the anesthesia and the procedures well with vital signs and in good condition.

## 2021-12-16 NOTE — ED Notes (Signed)
This RN called & spoke with lab re: blood cx's sent earlier. Per lab, 1 set was sent.

## 2021-12-16 NOTE — TOC Initial Note (Signed)
Transition of Care [facility]) - Initial/Assessment Note    Patient Details  Name: [name] MRN: [id number] Date of Birth: 12-Dec-1955  [facility]) CM/SW Contact:    [name] Hutching, RN Phone Number: 12/16/2021, 1:57 PM  Clinical Narrative:                 Patient admitted to the hospital for osteomyelitis of the left great toe.  Patient saw podiatrist Dr. Cleda [name] in the office today and he sent patient to ER for admission for amputation.  Planned amputation of the left great toe this afternoon, patient reports he is frustrated with the situation and the trouble that he has been having with this wound so he is ready to get this over with so he can move on.    Patient is from home where he lives alone- he is independent and drives.  Current with Primary Care Provider [name], he gets his prescriptions from [facility] on [address]  Patient's nephew lives next door and can help out if needed, his daughter lives in [place] but he does not want her contacted, he does not want to worry her.  He expects to be able to discharge home tomorrow.  No need for home health at this time as he will follow up with Dr. Cleda [name] in the office post op.    Expected Discharge Plan: Home/Self Care Barriers to Discharge: Continued Medical Work up   Patient Goals and CMS Choice Patient states their goals for this hospitalization and ongoing recovery are:: Patient wants to get this over with so he can move on.   Choice offered to / list presented to : NA  Expected Discharge Plan and Services Expected Discharge Plan: Home/Self Care   Discharge Planning Services: CM Consult Post Acute Care Choice: NA Living arrangements for the past 2 months: Single Family Home                           HH Arranged: NA [place] Agency: NA        Prior Living Arrangements/Services Living arrangements for the past 2 months: Single Family Home Lives with:: Self Patient language and need for interpreter reviewed::  Yes Do you feel safe going back to the place where you live?: Yes      Need for Family Participation in Patient Care: Yes (Comment) Care giver support system in place?: Yes (comment) Current home services: DME ([facility]) Criminal Activity/Legal Involvement Pertinent to Current Situation/Hospitalization: No - Comment as needed  Activities of Daily Living      Permission Sought/Granted   Permission granted to share information with : No              Emotional Assessment Appearance:: Appears stated age Attitude/Demeanor/Rapport: Engaged Affect (typically observed): Accepting Orientation: : Oriented to Self, Oriented to Place, Oriented to  Time, Oriented to Situation Alcohol / Substance Use: Not Applicable Psych Involvement: No (comment)  Admission diagnosis:  Osteomyelitis of ankle or foot, acute, left [facility]) [M86.172] Patient Active Problem List   Diagnosis Date Noted   Osteomyelitis of ankle or foot, acute, left ([place]) 12/16/2021   Gout    Lactic acidosis    CVA (cerebral vascular accident) ([place]) 12/06/2021   Diabetes mellitus type 2, uncomplicated ([place]) 0000000   AKI (acute kidney injury) ([place]) 12/06/2021   Hyponatremia 12/06/2021   Dysarthria 12/06/2021   PCP:  [name] Form, NP Pharmacy:   [id number] [address], [place] -  [address] [place] [address] Phone: [phone] Fax: [phone]     Social Determinants of Health (SDOH) Interventions    Readmission Risk Interventions No flowsheet data found.

## 2021-12-16 NOTE — ED Notes (Signed)
Pt with infected wound on left great toe. Pt told by MD to come into ED to be admitted to hospital for surgery. Pt alert, left great toe bandaged with scant bloody drainage. Pt given cup for urine sample.

## 2021-12-17 ENCOUNTER — Encounter: Payer: Self-pay | Admitting: Podiatry

## 2021-12-17 DIAGNOSIS — E872 Acidosis, unspecified: Secondary | ICD-10-CM

## 2021-12-17 DIAGNOSIS — M86172 Other acute osteomyelitis, left ankle and foot: Secondary | ICD-10-CM | POA: Diagnosis not present

## 2021-12-17 DIAGNOSIS — E11628 Type 2 diabetes mellitus with other skin complications: Secondary | ICD-10-CM | POA: Diagnosis not present

## 2021-12-17 DIAGNOSIS — L089 Local infection of the skin and subcutaneous tissue, unspecified: Secondary | ICD-10-CM

## 2021-12-17 DIAGNOSIS — E119 Type 2 diabetes mellitus without complications: Secondary | ICD-10-CM | POA: Diagnosis not present

## 2021-12-17 DIAGNOSIS — I639 Cerebral infarction, unspecified: Secondary | ICD-10-CM | POA: Diagnosis not present

## 2021-12-17 DIAGNOSIS — E1169 Type 2 diabetes mellitus with other specified complication: Secondary | ICD-10-CM | POA: Diagnosis not present

## 2021-12-17 LAB — CBC
HCT: 37.6 % — ABNORMAL LOW (ref 39.0–52.0)
Hemoglobin: 12.2 g/dL — ABNORMAL LOW (ref 13.0–17.0)
MCH: 29.3 pg (ref 26.0–34.0)
MCHC: 32.4 g/dL (ref 30.0–36.0)
MCV: 90.4 fL (ref 80.0–100.0)
Platelets: 243 10*3/uL (ref 150–400)
RBC: 4.16 MIL/uL — ABNORMAL LOW (ref 4.22–5.81)
RDW: 15.2 % (ref 11.5–15.5)
WBC: 7.1 10*3/uL (ref 4.0–10.5)
nRBC: 0 % (ref 0.0–0.2)

## 2021-12-17 LAB — BASIC METABOLIC PANEL
Anion gap: 10 (ref 5–15)
BUN: 23 mg/dL (ref 8–23)
CO2: 23 mmol/L (ref 22–32)
Calcium: 9.5 mg/dL (ref 8.9–10.3)
Chloride: 101 mmol/L (ref 98–111)
Creatinine, Ser: 1.48 mg/dL — ABNORMAL HIGH (ref 0.61–1.24)
GFR, Estimated: 52 mL/min — ABNORMAL LOW (ref >60)
Glucose, Bld: 211 mg/dL — ABNORMAL HIGH (ref 70–99)
Potassium: 5.3 mmol/L — ABNORMAL HIGH (ref 3.5–5.1)
Sodium: 134 mmol/L — ABNORMAL LOW (ref 135–145)

## 2021-12-17 LAB — LACTIC ACID, PLASMA: Lactic Acid, Venous: 1.6 mmol/L (ref 0.5–1.9)

## 2021-12-17 LAB — GLUCOSE, CAPILLARY
Glucose-Capillary: 185 mg/dL — ABNORMAL HIGH (ref 70–99)
Glucose-Capillary: 226 mg/dL — ABNORMAL HIGH (ref 70–99)
Glucose-Capillary: 242 mg/dL — ABNORMAL HIGH (ref 70–99)

## 2021-12-17 LAB — POTASSIUM: Potassium: 5.2 mmol/L — ABNORMAL HIGH (ref 3.5–5.1)

## 2021-12-17 MED ORDER — AMOXICILLIN-POT CLAVULANATE 875-125 MG PO TABS: 1.0000 | ORAL_TABLET | Freq: Two times a day (BID) | ORAL | 0 refills | Status: AC

## 2021-12-17 MED ORDER — CLOPIDOGREL BISULFATE 75 MG PO TABS: 75.0000 mg | ORAL_TABLET | Freq: Every day | ORAL | 0 refills | Status: AC

## 2021-12-17 MED ORDER — SODIUM CHLORIDE 0.9 % IV SOLN
1.5000 g | Freq: Four times a day (QID) | INTRAVENOUS | Status: DC
  Filled 2021-12-17: qty 4

## 2021-12-17 MED ORDER — ACETAMINOPHEN 325 MG PO TABS: 650.0000 mg | ORAL_TABLET | Freq: Four times a day (QID) | ORAL | Status: DC | PRN

## 2021-12-17 MED ORDER — INSULIN GLARGINE-YFGN 100 UNIT/ML ~~LOC~~ SOLN
5.0000 [IU] | Freq: Every day | SUBCUTANEOUS | Status: DC
  Administered 2021-12-17: 5 [IU] via SUBCUTANEOUS
  Filled 2021-12-17: qty 0.05

## 2021-12-17 MED ORDER — GABAPENTIN 300 MG PO CAPS
900.0000 mg | ORAL_CAPSULE | Freq: Three times a day (TID) | ORAL | Status: DC
Start: 2021-12-17 — End: 2021-12-17
  Administered 2021-12-17: 900 mg via ORAL
  Filled 2021-12-17: qty 3

## 2021-12-17 MED ORDER — LISINOPRIL 10 MG PO TABS: 5.0000 mg | ORAL_TABLET | Freq: Every day | ORAL | Status: DC

## 2021-12-17 MED ORDER — SODIUM CHLORIDE 0.9 % IV SOLN: INTRAVENOUS | Status: DC

## 2021-12-17 MED ORDER — GABAPENTIN 300 MG PO CAPS
900.0000 mg | ORAL_CAPSULE | Freq: Three times a day (TID) | ORAL | Status: DC
Start: 2021-12-17 — End: 2022-08-17

## 2021-12-17 NOTE — Progress Notes (Signed)
DISCHARGE NOTE: ° °Pt given discharge instructions, and verbalized understanding.  °

## 2021-12-17 NOTE — Discharge Summary (Signed)
Physician Discharge Summary  [name] [id number] DOB: 1956-02-23 DOA: 12/16/2021  PCP: Peggye Form, NP  Admit date: 12/16/2021 Discharge date: 12/17/2021  Time spent: 55 minutes  Recommendations for Outpatient Follow-up:  Follow-up with Dr. Cleda Mccreedy, podiatry in 1 week. Follow-up with [name] L, NP in 1 to 2 weeks.  On follow-up patient will need a basic metabolic profile done to follow-up on electrolytes and renal function, patient's diabetes also need to be reassessed on follow-up.    Discharge Diagnoses:  Principal Problem:   Osteomyelitis of ankle or foot, acute, left ([place]) Active Problems:   CVA (cerebral vascular accident) ([place])   Diabetes mellitus type 2, uncomplicated ([place])   Gout   Lactic acidosis   Discharge Condition: Stable and improved  Diet recommendation: Carb modified  Filed Weights   12/16/21 1046 12/16/21 1705  Weight: 104 kg 104 kg    History of present illness:  HPI per Dr.Agbata   [name] is a 66 y.o. male with medical history significant for diabetes mellitus, gout, hypertension, history of CVA with left-sided weakness who was sent to the emergency room from the podiatrist office for evaluation of her left great toe ulcer. Patient states that he bought a pair of new diabetic shoes and that 2 weeks ago he noticed that he had developed an ulcer over his left great toe from pressure. He has had bloody drainage from that wound and increased redness over his left great toe. He was placed on antibiotics after being seen at the wound care clinic without any improvement in the wound. He had a recent x-ray which showed changes consistent with osteomyelitis as well as fracture of the distal phalanx of the hallux. He has had no fever, no chills, no chest pain, no shortness of breath, no headache, no dizziness, no lightheadedness, no abdominal pain, no urinary symptoms, no changes in his bowel habits, no focal deficit   Hospital Course:  #1  osteomyelitis left great toe -Patient admitted from orthopedics office after recent x-ray showed changes consistent with osteomyelitis as well as fracture of the distal phalanx of the hallux. -Patient placed empirically on IV vancomycin, IV Rocephin, IV metronidazole and patient seen in consultation by podiatry. -Patient subsequently underwent partial amputation left great toe per podiatry for definitive treatment. -Patient assessed by ID who had recommended 7-day course of Augmentin on discharge. -Outpatient follow-up with podiatry 1 week postdischarge.  2.  Diabetes mellitus type 2 -Patient's home regimen oral hypoglycemic agents were held during the hospitalization and patient maintained on sliding scale insulin.  3.  History of CVA with left-sided hemiparesis -Patient maintained on aspirin as well as high intensity statin. -Patient's Plavix was held for procedure and will be resumed 3 days post discharge per podiatry recommendations.  4.  Hypertension -Patient's lisinopril held during the hospitalization will be resumed on discharge 3 days post discharge.  5.  Depression -Remained stable. -Patient maintained on home regimen Wellbutrin.  6.  History of gout -Stable. -Patient maintained on home regimen allopurinol.  7.  Lactic acidosis -Patient with no signs of sepsis -Lactic acidosis had resolved by day of discharge.  Procedures: Partial amputation left great toe per podiatry: Dr.Cline 12/16/2021 Plain films left foot 12/15/2021   Consultations: Podiatry: Dr.Cline 12/16/2021 Infectious diseases: Dr. Ramon Dredge 12/17/2021  Discharge Exam: Vitals:   12/17/21 0430 12/17/21 0742  BP: 107/62 (!) 99/58  Pulse: 82 67  Resp: 16 18  Temp: 98 F (36.7 C) 98 F (36.7 C)  SpO2: 97% 98%  General: NAD Cardiovascular: Regular rate and rhythm no murmurs rubs or gallops.  No JVD.  No lower extremity edema.  Left lower extremity bandaged Respiratory: CTA B.  No wheezes, no  crackles, no rhonchi.  Normal respiratory effort.   Discharge Instructions   Discharge Instructions     Diet Carb Modified   Complete by: As directed    Discharge wound care:   Complete by: As directed    As above   Increase activity slowly   Complete by: As directed       Allergies as of 12/17/2021   Not on File      Medication List     STOP taking these medications    empagliflozin 25 MG Tabs tablet Commonly known as: JARDIANCE       TAKE these medications    acetaminophen 325 MG tablet Commonly known as: TYLENOL Take 2 tablets (650 mg total) by mouth every 6 (six) hours as needed for mild pain (or Fever >/= 101).   allopurinol 300 MG tablet Commonly known as: ZYLOPRIM Take 300 mg by mouth daily.   amoxicillin-clavulanate 875-125 MG tablet Commonly known as: Augmentin Take 1 tablet by mouth 2 (two) times daily for 7 days.   aspirin 81 MG chewable tablet Chew 81 mg by mouth daily.   atorvastatin 80 MG tablet Commonly known as: Lipitor Take 1 tablet (80 mg total) by mouth daily.   buPROPion 150 MG 24 hr tablet Commonly known as: WELLBUTRIN XL Take 150 mg by mouth daily.   clopidogrel 75 MG tablet Commonly known as: PLAVIX Take 1 tablet (75 mg total) by mouth daily for 21 days. Start taking on: December 20, 2021 What changed: These instructions start on December 20, 2021. If you are unsure what to do until then, ask your doctor or other care provider.   gabapentin 300 MG capsule Commonly known as: NEURONTIN Take 3 capsules (900 mg total) by mouth 3 (three) times daily. What changed: how much to take   lisinopril 10 MG tablet Commonly known as: ZESTRIL Take 0.5 tablets (5 mg total) by mouth daily. Start taking on: December 20, 2021 What changed: These instructions start on December 20, 2021. If you are unsure what to do until then, ask your doctor or other care provider.   magnesium oxide 400 MG tablet Commonly known as: MAG-OX Take 400 mg by  mouth daily.   metFORMIN 500 MG tablet Commonly known as: GLUCOPHAGE Take 1,000 mg by mouth 2 (two) times daily with a meal.   QUEtiapine 25 MG tablet Commonly known as: SEROQUEL Take 25 mg by mouth at bedtime.   rOPINIRole 0.5 MG tablet Commonly known as: REQUIP Take 0.5 mg by mouth at bedtime.   Semaglutide 7 MG Tabs Take 7 mg by mouth daily.   vitamin B-12 1000 MCG tablet Commonly known as: CYANOCOBALAMIN Take 1,000 mcg by mouth daily.   Vitamin D2 50 MCG (2000 UT) Tabs Take 2,000 Units by mouth daily.               Discharge Care Instructions  (From admission, onward)           Start     Ordered   12/17/21 0000  Discharge wound care:       Comments: As above   12/17/21 1300           Not on File  Follow-up Information     Sharlotte Alamo, DPM. Schedule an appointment as soon as possible for a  visit in 1 week(s).   Specialty: Podiatry Why: Office was closed. Please call to make an appointment Contact information: [place] [address] [phone]         Peggye Form, NP. Schedule an appointment as soon as possible for a visit in 2 week(s).   Specialty: Family Medicine Why: Follow-up in 1 to 2 weeks. Contact information: [place] [address] [phone]                  The results of significant diagnostics from this hospitalization (including imaging, microbiology, ancillary and laboratory) are listed below for reference.    Significant Diagnostic Studies: MR BRAIN WO CONTRAST  Result Date: 12/06/2021 CLINICAL DATA:  Neuro deficit, acute, stroke suspected EXAM: MRI HEAD WITHOUT CONTRAST TECHNIQUE: Multiplanar, multiecho pulse sequences of the brain and surrounding structures were obtained without intravenous contrast. COMPARISON:  None. FINDINGS: Brain: Small focus of reduced diffusion is present in the right thalamocapsular region. This corresponds to abnormality questioned on prior  CT. No additional foci of reduced diffusion. No evidence of intracranial hemorrhage. There is no intracranial mass, mass effect, hydrocephalus, or extra-axial collection. Prominence of the ventricles and sulci reflects parenchymal volume loss. Patchy foci of T2 hyperintensity in the supratentorial white matter are nonspecific but may reflect mild chronic microvascular ischemic changes. Vascular: Major vessel flow voids at the skull base are preserved. Skull and upper cervical spine: Normal marrow signal is preserved. Sinuses/Orbits: Paranasal sinuses are aerated. Orbits are unremarkable. Other: Sella is unremarkable.  Mastoid air cells are clear. IMPRESSION: Acute small vessel infarct right thalamocapsular region. Mild chronic microvascular ischemic changes. Electronically Signed   By: Macy Mis M.D.   On: 12/06/2021 15:59   DG Foot Complete Left  Result Date: 12/15/2021 CLINICAL DATA:  Non-healing wound left great toe. EXAM: LEFT FOOT - COMPLETE 3+ VIEW COMPARISON:  None. FINDINGS: Destructive changes involving the great toe distal phalanx. Transverse lucency consistent with fracture with mild displacement, possibly pathologic. A dressing overlies the great toe, vague increased density in the soft tissues subjacent to the dressing. No other findings suspicious for osteomyelitis. 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 Moderate plantar calcaneal spur and Achilles tendon enthesophyte. No soft tissue gas. IMPRESSION: 1. Destructive changes involving the great toe distal phalanx consistent with osteomyelitis. There is a transverse fracture through the distal phalanx which may be pathologic. Vague increased density in the soft tissues subjacent to the dressing may represent tissue wound or overlying artifact related to dressing. 2. 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 Electronically Signed   By: Keith Rake M.D.   On: 12/15/2021 13:07    ECHOCARDIOGRAM COMPLETE  Result Date: 12/07/2021    ECHOCARDIOGRAM REPORT   Patient Name:   LANTZ GASPER Date of Exam: 12/07/2021 Medical Rec #:  [id number]     Height:       74.0 in Accession #:    [id number]    Weight:       230.0 lb Date of Birth:  1956-07-26    BSA:          2.306 m Patient Age:    20 years      BP:           125/78 mmHg Patient Gender: M             HR:           77 bpm. Exam Location:  [facility]  Procedure: 2D Echo, Cardiac Doppler and Color Doppler Indications:     Stroke I63.9  History:         Patient has no prior history of Echocardiogram examinations.                  Risk Factors:Diabetes.  Sonographer:     Sherrie Sport Referring Phys:  [id number] Eben Burow Diagnosing Phys: Harrell Gave End MD  Sonographer Comments: Suboptimal parasternal window and Technically challenging study due to limited acoustic windows. IMPRESSIONS  1. Left ventricular ejection fraction, by estimation, is 50 to 55%. The left ventricle has low normal function. Left ventricular endocardial border not optimally defined to evaluate regional wall motion. There is moderate left ventricular hypertrophy. Left ventricular diastolic parameters are consistent with Grade II diastolic dysfunction (pseudonormalization).  2. Right ventricular systolic function is normal. The right ventricular size is normal.  3. The mitral valve is abnormal. Trivial mitral valve regurgitation. No evidence of mitral stenosis.  4. The aortic valve was not well visualized. There is mild thickening of the aortic valve. Aortic valve regurgitation is not visualized. Aortic valve sclerosis is present, with no evidence of aortic valve stenosis. FINDINGS  Left Ventricle: Left ventricular ejection fraction, by estimation, is 50 to 55%. The left ventricle has low normal function. Left ventricular endocardial border not optimally defined to evaluate regional wall motion. The left ventricular internal cavity  size was normal in size. There is moderate left  ventricular hypertrophy. Left ventricular diastolic parameters are consistent with Grade II diastolic dysfunction (pseudonormalization). Right Ventricle: The right ventricular size is normal. Right vetricular wall thickness was not well visualized. Right ventricular systolic function is normal. Left Atrium: Left atrial size was normal in size. Right Atrium: Right atrial size was normal in size. Pericardium: The pericardium was not well visualized. Mitral Valve: The mitral valve is abnormal. Mild mitral annular calcification. Trivial mitral valve regurgitation. No evidence of mitral valve stenosis. MV peak gradient, 2.4 mmHg. The mean mitral valve gradient is 1.0 mmHg. Tricuspid Valve: The tricuspid valve is grossly normal. Tricuspid valve regurgitation is trivial. Aortic Valve: The aortic valve was not well visualized. There is mild thickening of the aortic valve. Aortic valve regurgitation is not visualized. Aortic valve sclerosis is present, with no evidence of aortic valve stenosis. Aortic valve mean gradient measures 2.5 mmHg. Aortic valve peak gradient measures 4.2 mmHg. Aortic valve area, by VTI measures 2.37 cm. Pulmonic Valve: The pulmonic valve was not well visualized. Aorta: The aortic root is normal in size and structure. Pulmonary Artery: The pulmonary artery is not well seen. Venous: The inferior vena cava was not well visualized. IAS/Shunts: The interatrial septum was not well visualized.  LEFT VENTRICLE PLAX 2D LVIDd:         3.70 cm   Diastology LVIDs:         2.60 cm   LV e' medial:    6.96 cm/s LV PW:         1.00 cm   LV E/e' medial:  9.5 LV IVS:        1.63 cm   LV e' lateral:   13.70 cm/s LVOT diam:     2.10 cm   LV E/e' lateral: 4.8 LV SV:         46 LV SV Index:   20 LVOT Area:     3.46 cm  RIGHT VENTRICLE RV Basal diam:  3.30 cm RV S prime:     10.80 cm/s TAPSE (M-mode): 2.9  cm LEFT ATRIUM           Index        RIGHT ATRIUM           Index LA diam:      3.40 cm 1.47 cm/m   RA Area:      12.40 cm LA Vol (A2C): 68.0 ml 29.48 ml/m  RA Volume:   29.70 ml  12.88 ml/m LA Vol (A4C): 28.1 ml 12.18 ml/m  AORTIC VALVE AV Area (Vmax):    2.51 cm AV Area (Vmean):   2.08 cm AV Area (VTI):     2.37 cm AV Vmax:           102.50 cm/s AV Vmean:          75.350 cm/s AV VTI:            0.193 m AV Peak Grad:      4.2 mmHg AV Mean Grad:      2.5 mmHg LVOT Vmax:         74.40 cm/s LVOT Vmean:        45.200 cm/s LVOT VTI:          0.132 m LVOT/AV VTI ratio: 0.68  AORTA Ao Root diam: 3.05 cm MITRAL VALVE               TRICUSPID VALVE MV Area (PHT): 3.08 cm    TR Peak grad:   10.8 mmHg MV Area VTI:   2.61 cm    TR Vmax:        164.00 cm/s MV Peak grad:  2.4 mmHg MV Mean grad:  1.0 mmHg    SHUNTS MV Vmax:       0.78 m/s    Systemic VTI:  0.13 m MV Vmean:      48.6 cm/s   Systemic Diam: 2.10 cm MV Decel Time: 246 msec MV E velocity: 66.00 cm/s MV A velocity: 63.00 cm/s MV E/A ratio:  1.05 Harrell Gave End MD Electronically signed by Nelva Bush MD Signature Date/Time: 12/07/2021/10:07:38 AM    Final    CT HEAD CODE STROKE WO CONTRAST  Result Date: 12/06/2021 CLINICAL DATA:  Code stroke. Neuro deficit, acute, stroke suspected; Stroke, follow up. Left facial droop, left arm weakness EXAM: CT HEAD WITHOUT CONTRAST CT ANGIOGRAPHY OF THE HEAD AND NECK TECHNIQUE: Contiguous axial images were obtained from the base of the skull through the vertex without intravenous contrast. Multidetector CT imaging of the head and neck was performed using the standard protocol during bolus administration of intravenous contrast. Multiplanar CT image reconstructions and MIPs were obtained to evaluate the vascular anatomy. Carotid stenosis measurements (when applicable) are obtained utilizing NASCET criteria, using the distal internal carotid diameter as the denominator. RADIATION DOSE REDUCTION: This exam was performed according to the departmental dose-optimization program which includes automated exposure control, adjustment of the  mA and/or kV according to patient size and/or use of iterative reconstruction technique. CONTRAST:  38mL OMNIPAQUE IOHEXOL 350 MG/ML SOLN COMPARISON:  None. FINDINGS: CT HEAD Brain: There is no acute intracranial hemorrhage, mass effect, or edema. Gray-white differentiation is preserved. There is no extra-axial fluid collection. Prominence of the ventricles and sulci reflects mild parenchymal volume loss. Patchy low-density in the supratentorial white matter is nonspecific but may reflect mild chronic microvascular ischemic changes. Vascular: No hyperdense vessel. Skull: Calvarium is unremarkable. Sinuses/Orbits: No acute finding. Other: None. Review of the MIP images confirms the above findings CTA NECK Aortic arch: Calcified plaque along the arch  and patent great vessel origins. There is noncalcified plaque along the proximal left subclavian causing about 50% stenosis. Right carotid system: Patent. Primarily calcified plaque at the bifurcation and proximal internal carotid with minimal stenosis. Left carotid system: Patent. Mixed plaque at the bifurcation and proximal internal carotid with minimal stenosis. Vertebral arteries: Patent. Right vertebral is mildly dominant. There is mild calcified plaque along the right V1 vertebral artery. No stenosis. Skeleton: Cervical spine degenerative changes. Other neck: Unremarkable. Upper chest: No apical lung mass. Review of the MIP images confirms the above findings CTA HEAD Anterior circulation: Intracranial internal carotid arteries are patent. Anterior and middle cerebral arteries are patent. Posterior circulation: Intracranial vertebral arteries are patent. Basilar artery is patent. Major cerebellar artery origins are patent. Posterior cerebral arteries are patent. Venous sinuses: Patent as allowed by contrast bolus timing. Review of the MIP images confirms the above findings IMPRESSION: There is no acute intracranial hemorrhage or evidence of acute infarction. ASPECT  score is 10. Mild chronic microvascular ischemic changes. Possible superimposed age-indeterminate small vessel infarct of the right thalamocapsular region. No large vessel occlusion, hemodynamically significant stenosis, or evidence of dissection. Preliminary results were communicated to Dr. Rory Percy at 11:31 am on 12/06/2021 by text page via the [facility] messaging system. Electronically Signed   By: Macy Mis M.D.   On: 12/06/2021 11:57   CT ANGIO HEAD NECK W WO CM (CODE STROKE)  Result Date: 12/06/2021 CLINICAL DATA:  Code stroke. Neuro deficit, acute, stroke suspected; Stroke, follow up. Left facial droop, left arm weakness EXAM: CT HEAD WITHOUT CONTRAST CT ANGIOGRAPHY OF THE HEAD AND NECK TECHNIQUE: Contiguous axial images were obtained from the base of the skull through the vertex without intravenous contrast. Multidetector CT imaging of the head and neck was performed using the standard protocol during bolus administration of intravenous contrast. Multiplanar CT image reconstructions and MIPs were obtained to evaluate the vascular anatomy. Carotid stenosis measurements (when applicable) are obtained utilizing NASCET criteria, using the distal internal carotid diameter as the denominator. RADIATION DOSE REDUCTION: This exam was performed according to the departmental dose-optimization program which includes automated exposure control, adjustment of the mA and/or kV according to patient size and/or use of iterative reconstruction technique. CONTRAST:  5mL OMNIPAQUE IOHEXOL 350 MG/ML SOLN COMPARISON:  None. FINDINGS: CT HEAD Brain: There is no acute intracranial hemorrhage, mass effect, or edema. Gray-white differentiation is preserved. There is no extra-axial fluid collection. Prominence of the ventricles and sulci reflects mild parenchymal volume loss. Patchy low-density in the supratentorial white matter is nonspecific but may reflect mild chronic microvascular ischemic changes. Vascular: No hyperdense  vessel. Skull: Calvarium is unremarkable. Sinuses/Orbits: No acute finding. Other: None. Review of the MIP images confirms the above findings CTA NECK Aortic arch: Calcified plaque along the arch and patent great vessel origins. There is noncalcified plaque along the proximal left subclavian causing about 50% stenosis. Right carotid system: Patent. Primarily calcified plaque at the bifurcation and proximal internal carotid with minimal stenosis. Left carotid system: Patent. Mixed plaque at the bifurcation and proximal internal carotid with minimal stenosis. Vertebral arteries: Patent. Right vertebral is mildly dominant. There is mild calcified plaque along the right V1 vertebral artery. No stenosis. Skeleton: Cervical spine degenerative changes. Other neck: Unremarkable. Upper chest: No apical lung mass. Review of the MIP images confirms the above findings CTA HEAD Anterior circulation: Intracranial internal carotid arteries are patent. Anterior and middle cerebral arteries are patent. Posterior circulation: Intracranial vertebral arteries are patent. Basilar artery is patent. Major  cerebellar artery origins are patent. Posterior cerebral arteries are patent. Venous sinuses: Patent as allowed by contrast bolus timing. Review of the MIP images confirms the above findings IMPRESSION: There is no acute intracranial hemorrhage or evidence of acute infarction. ASPECT score is 10. Mild chronic microvascular ischemic changes. Possible superimposed age-indeterminate small vessel infarct of the right thalamocapsular region. No large vessel occlusion, hemodynamically significant stenosis, or evidence of dissection. Preliminary results were communicated to Dr. Rory Percy at 11:31 am on 12/06/2021 by text page via the [facility] messaging system. Electronically Signed   By: Macy Mis M.D.   On: 12/06/2021 11:57    Microbiology: Recent Results (from the past 240 hour(s))  Blood culture (routine x 2)     Status: None (Preliminary  result)   Collection Time: 12/16/21 10:49 AM   Specimen: BLOOD  Result Value Ref Range Status   Specimen Description BLOOD RIGHT [facility]  Final   Special Requests   Final    BOTTLES DRAWN AEROBIC AND ANAEROBIC Blood Culture adequate volume   Culture   Final    NO GROWTH < 24 HOURS Performed at [facility], [address] [place], [place] [address]    Report Status PENDING  Incomplete  Resp Panel by RT-PCR (Flu A&B, Covid) Nasopharyngeal Swab     Status: None   Collection Time: 12/16/21 11:38 AM   Specimen: Nasopharyngeal Swab; Nasopharyngeal(NP) swabs in vial transport medium  Result Value Ref Range Status   SARS Coronavirus 2 by RT PCR NEGATIVE NEGATIVE Final    Comment: (NOTE) SARS-CoV-2 target nucleic acids are NOT DETECTED.  The SARS-CoV-2 RNA is generally detectable in upper respiratory specimens during the acute phase of infection. The lowest concentration of SARS-CoV-2 viral copies this assay can detect is 138 copies/mL. A negative result does not preclude SARS-Cov-2 infection and should not be used as the sole basis for treatment or other patient management decisions. A negative result may occur with  improper specimen collection/handling, submission of specimen other than nasopharyngeal swab, presence of viral mutation(s) within the areas targeted by this assay, and inadequate number of viral copies(<138 copies/mL). A negative result must be combined with clinical observations, patient history, and epidemiological information. The expected result is Negative.  Fact Sheet for Patients:  EntrepreneurPulse.com.au  Fact Sheet for Healthcare Providers:  IncredibleEmployment.be  This test is no t yet approved or cleared by the [place] FDA and  has been authorized for detection and/or diagnosis of SARS-CoV-2 by FDA under an Emergency Use Authorization (EUA). This EUA will remain  in effect (meaning this test can be used) for  the duration of the COVID-19 declaration under Section 564(b)(1) of the Act, 21 U.S.C.section 360bbb-3(b)(1), unless the authorization is terminated  or revoked sooner.       Influenza A by PCR NEGATIVE NEGATIVE Final   Influenza B by PCR NEGATIVE NEGATIVE Final    Comment: (NOTE) The Xpert Xpress SARS-CoV-2/FLU/RSV plus assay is intended as an aid in the diagnosis of influenza from Nasopharyngeal swab specimens and should not be used as a sole basis for treatment. Nasal washings and aspirates are unacceptable for Xpert Xpress SARS-CoV-2/FLU/RSV testing.  Fact Sheet for Patients: EntrepreneurPulse.com.au  Fact Sheet for Healthcare Providers: IncredibleEmployment.be  This test is not yet approved or cleared by the [place] FDA and has been authorized for detection and/or diagnosis of SARS-CoV-2 by FDA under an Emergency Use Authorization (EUA). This EUA will remain in effect (meaning this test can be used) for the duration of  the COVID-19 declaration under Section 564(b)(1) of the Act, 21 U.S.C. section 360bbb-3(b)(1), unless the authorization is terminated or revoked.  Performed at [facility], [address] [place], [place] [address]   Blood culture (routine x 2)     Status: None (Preliminary result)   Collection Time: 12/16/21 11:38 AM   Specimen: BLOOD  Result Value Ref Range Status   Specimen Description BLOOD RIGHT [facility]  Final   Special Requests   Final    BOTTLES DRAWN AEROBIC AND ANAEROBIC Blood Culture results may not be optimal due to an excessive volume of blood received in culture bottles   Culture   Final    NO GROWTH < 24 HOURS Performed at [facility], [address], [place], [place] [address]    Report Status PENDING  Incomplete  MRSA Next Gen by PCR, Nasal     Status: None   Collection Time: 12/16/21  2:47 PM   Specimen: Nasal Mucosa; Nasal Swab  Result Value Ref Range Status   MRSA by PCR  Next Gen NOT DETECTED NOT DETECTED Final    Comment: (NOTE) The GeneXpert MRSA Assay (FDA approved for NASAL specimens only), is one component of a comprehensive MRSA colonization surveillance program. It is not intended to diagnose MRSA infection nor to guide or monitor treatment for MRSA infections. Test performance is not FDA approved in patients less than 76 years old. Performed at [facility], [address] [place], [name] [address]   Aerobic/Anaerobic Culture w Gram Stain (surgical/deep wound)     Status: None (Preliminary result)   Collection Time: 12/16/21  5:35 PM   Specimen: PATH Digit amputation; Tissue  Result Value Ref Range Status   Specimen Description   Final    TOE LEFT Performed at [facility], [address] [place], [place] [address]    Special Requests   Final    NONE Performed at [facility], [address] [place], [place] [address]    Gram Stain   Final    NO WBC SEEN NO ORGANISMS SEEN Performed at [facility], [place] [address], [place], [place] [address]    Culture PENDING  Incomplete   Report Status PENDING  Incomplete     Labs: Basic Metabolic Panel: Recent Labs  Lab 12/16/21 1049 12/17/21 0555 12/17/21 0759  NA 135 134*  --   K 4.4 5.3* 5.2*  CL 99 101  --   CO2 24 23  --   GLUCOSE 149* 211*  --   BUN 17 23  --   CREATININE 1.37* 1.48*  --   CALCIUM 9.9 9.5  --    Liver Function Tests: Recent Labs  Lab 12/16/21 1049  AST 20  ALT 13  ALKPHOS 83  BILITOT 0.6  PROT 9.1*  ALBUMIN 4.2   No results for input(s): LIPASE, AMYLASE in the last 168 hours. No results for input(s): AMMONIA in the last 168 hours. CBC: Recent Labs  Lab 12/16/21 1049 12/17/21 0555  WBC 7.9 7.1  NEUTROABS 4.9  --   HGB 13.6 12.2*  HCT 44.1 37.6*  MCV 93.8 90.4  PLT 304 243   Cardiac Enzymes: No results for input(s): CKTOTAL, CKMB, CKMBINDEX, TROPONINI in the last 168 hours. BNP: BNP (last 3 results) No  results for input(s): BNP in the last 8760 hours.  ProBNP (last 3 results) No results for input(s): PROBNP in the last 8760 hours.  CBG: Recent Labs  Lab 12/16/21 2001  12/16/21 2354 12/17/21 0425 12/17/21 0746 12/17/21 1200  GLUCAP 138* 223* 185* 226* 242*       Signed:  Irine Seal MD.  [facility] 12/17/2021, 1:11 PM

## 2021-12-17 NOTE — Progress Notes (Signed)
1 Day Post-Op   Subjective/Chief Complaint: Patient seen.  Complains of some pain in his foot.   Objective: Vital signs in last 24 hours: Temp:  [97.2 F (36.2 C)-98.4 F (36.9 C)] 98 F (36.7 C) (02/17 0742) Pulse Rate:  [65-91] 67 (02/17 0742) Resp:  [13-19] 18 (02/17 0742) BP: (99-130)/(58-80) 99/58 (02/17 0742) SpO2:  [97 %-100 %] 98 % (02/17 0742) Weight:  [104 kg] 104 kg (02/16 1705) Last BM Date : 12/16/21  Intake/Output from previous day: 02/16 0701 - 02/17 0700 In: 3382.5 [I.V.:1802.2; IV Piggyback:1580.3] Out: 480 [Urine:475; Blood:5] Intake/Output this shift: Total I/O In: -  Out: 500 [Urine:500]  Bandages dry and intact.  Upon removal there is some moderate dried and active bleeding on the bandaging.  Incision is well coapted.  No active bleeding or drainage.  Mild blanched appearance to the plantar aspect of the incision.  Skin edges appear viable.     Lab Results:  Recent Labs    12/16/21 1049 12/17/21 0555  WBC 7.9 7.1  HGB 13.6 12.2*  HCT 44.1 37.6*  PLT 304 243   BMET Recent Labs    12/16/21 1049 12/17/21 0555 12/17/21 0759  NA 135 134*  --   K 4.4 5.3* 5.2*  CL 99 101  --   CO2 24 23  --   GLUCOSE 149* 211*  --   BUN 17 23  --   CREATININE 1.37* 1.48*  --   CALCIUM 9.9 9.5  --    PT/INR No results for input(s): LABPROT, INR in the last 72 hours. ABG No results for input(s): PHART, HCO3 in the last 72 hours.  Invalid input(s): PCO2, PO2  Studies/Results: No results found.  Anti-infectives: Anti-infectives (From admission, onward)    Start     Dose/Rate Route Frequency Ordered Stop   12/17/21 0600  vancomycin (VANCOCIN) IVPB 1000 mg/200 mL premix        1,000 mg 200 mL/hr over 60 Minutes Intravenous Every 12 hours 12/16/21 1401     12/17/21 0000  amoxicillin-clavulanate (AUGMENTIN) 875-125 MG tablet        1 tablet Oral 2 times daily 12/17/21 1300 12/24/21 2359   12/16/21 1415  vancomycin (VANCOCIN) IVPB 1000 mg/200 mL  premix        1,000 mg 200 mL/hr over 60 Minutes Intravenous  Once 12/16/21 1400     12/16/21 1400  metroNIDAZOLE (FLAGYL) IVPB 500 mg        500 mg 100 mL/hr over 60 Minutes Intravenous Every 8 hours 12/16/21 1334 12/23/21 1359   12/16/21 1400  cefTRIAXone (ROCEPHIN) 2 g in sodium chloride 0.9 % 100 mL IVPB        2 g 200 mL/hr over 30 Minutes Intravenous Every 24 hours 12/16/21 1335 12/23/21 1359   12/16/21 1345  cefTRIAXone (ROCEPHIN) 2 g in sodium chloride 0.9 % 100 mL IVPB  Status:  Discontinued        2 g 200 mL/hr over 30 Minutes Intravenous Every 24 hours 12/16/21 1334 12/16/21 1335   12/16/21 1215  ceFEPIme (MAXIPIME) 2 g in sodium chloride 0.9 % 100 mL IVPB  Status:  Discontinued        2 g 200 mL/hr over 30 Minutes Intravenous  Once 12/16/21 1210 12/16/21 1342   12/16/21 1215  vancomycin (VANCOCIN) IVPB 1000 mg/200 mL premix        1,000 mg 200 mL/hr over 60 Minutes Intravenous  Once 12/16/21 1210 12/16/21 1338  Assessment/Plan: s/p Procedure(s): AMPUTATION TOE - Left Great (Left) Assessment: Stable status post amputation left hallux.  Plan: Betadine and a sterile bandage reapplied to the left foot.  Patient instructed to keep this clean, dry, and do not remove.  Resume his Augmentin that he has at home.  Discussed limited walking on the foot using his surgical shoe.  Plan for follow-up next week for reevaluation.  Patient is stable for discharge.  LOS: 1 day    Durward Fortes 12/17/2021

## 2021-12-17 NOTE — Consult Note (Addendum)
NAME: [name]  DOB: 23-Jan-1956  MRN: [id number]  Date/Time: 12/17/2021 1:17 PM  REQUESTING PROVIDER: Dr. Grandville Silos Subjective:  REASON FOR CONSULT: Left toe infection ? [name] is a 66 y.o. male with a history of diabetes mellitus, peripheral neuropathy, gout, presented to the hospital with 3-week history of a wound that started on the left great toe after possible injury. 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  Then on February 1 he had noted that the left great toe had a wound in which she thought initially was due to the new orthotics which have caused him.[name]  He was prescribed amoxicillin. He was followed weekly at the wound clinic He recently had a hospitalization for CVA. When he went to see the podiatrist on 12/15/2021 he was asked to go to the ED because of worsening infection.  He had no fever or chills.  In the ED vitals 126/68, temperature 97.2, pulse 74, respiratory 18 and sats 100% WBC 7.9, Hb 13.6, platelet 304, creatinine 1.37.    X-ray of the foot showed destructive changes involving the right great toe distal phalanx consistent with osteomyelitis.  There was a transverse fracture through the distal phalanx.  He was seen by podiatrist and underwent amputation of the distal phalanx of the left great toe. Asked to see the patient for antibiotic recommendation.  Patient is currently on Vanco, ceftriaxone and Flagyl.  Past Medical History:  Diagnosis Date   Diabetes mellitus without complication ([place])    Gout     Past Surgical History:  Procedure Laterality Date   AMPUTATION TOE Left 12/16/2021   Procedure: AMPUTATION TOE - Left Great;  Surgeon: Sharlotte Alamo, DPM;  Location: [facility] ORS;  Service: Podiatry;  Laterality: Left;   LAPAROSCOPIC GASTRIC BANDING      Social History   Socioeconomic History   Marital status: Single    Spouse name: Not on file    Number of children: Not on file   Years of education: Not on file   Highest education level: Not on file  Occupational History   Not on file  Tobacco Use   Smoking status: Never   Smokeless tobacco: Never  Substance and Sexual Activity   Alcohol use: Never   Drug use: Never   Sexual activity: Not on file  Other Topics Concern   Not on file  Social History Narrative   Not on file   Social Determinants of Health   Financial Resource Strain: Not on file  Food Insecurity: Not on file  Transportation Needs: Not on file  Physical Activity: Not on file  Stress: Not on file  Social Connections: Not on file  Intimate Partner Violence: Not on file    History reviewed. No pertinent family history. Not on File I? Current Facility-Administered Medications  Medication Dose Route Frequency Provider Last Rate Last Admin   0.9 %  sodium chloride infusion   Intravenous Continuous Eugenie Filler, MD 125 mL/hr at 12/17/21 0815 New Bag at 12/17/21 0815   acetaminophen (TYLENOL) tablet 650 mg  650 mg Oral Q6H PRN Sharlotte Alamo, DPM       Or   acetaminophen (TYLENOL) suppository 650 mg  650 mg Rectal Q6H PRN Sharlotte Alamo, DPM       allopurinol (ZYLOPRIM) tablet 300 mg  300 mg Oral Daily Sharlotte Alamo, DPM   300 mg at 12/17/21 [phone]  aspirin chewable tablet 81 mg  81 mg Oral Daily Sharlotte Alamo, DPM   81 mg at 12/17/21 [id number]   atorvastatin (LIPITOR) tablet 80 mg  80 mg Oral Daily Sharlotte Alamo, DPM   80 mg at 12/17/21 [id number]   buPROPion (WELLBUTRIN XL) 24 hr tablet 150 mg  150 mg Oral Daily Sharlotte Alamo, DPM   150 mg at 12/17/21 [id number]   cefTRIAXone (ROCEPHIN) 2 g in sodium chloride 0.9 % 100 mL IVPB  2 g Intravenous Q24H Sharlotte Alamo, DPM 200 mL/hr at 12/16/21 1458 2 g at 12/16/21 1458   cholecalciferol (VITAMIN D3) tablet 2,000 Units  2,000 Units Oral Daily Sharlotte Alamo, DPM   2,000 Units at 12/17/21 [id number]   gabapentin (NEURONTIN) capsule 900 mg  900 mg Oral TID Eugenie Filler, MD   900 mg at 12/17/21 1207    HYDROcodone-acetaminophen (NORCO/VICODIN) 5-325 MG per tablet 1 tablet  1 tablet Oral Q4H PRN Sharlotte Alamo, DPM       insulin aspart (novoLOG) injection 0-15 Units  0-15 Units Subcutaneous Q4H Sharlotte Alamo, DPM   5 Units at 12/17/21 1207   insulin glargine-yfgn [facility]) injection 5 Units  5 Units Subcutaneous Daily Eugenie Filler, MD   5 Units at 12/17/21 [id number]   magnesium oxide (MAG-OX) tablet 400 mg  400 mg Oral Daily Sharlotte Alamo, DPM   400 mg at 12/17/21 [id number]   metroNIDAZOLE (FLAGYL) IVPB 500 mg  500 mg Intravenous Q8H Sharlotte Alamo, DPM 100 mL/hr at 12/17/21 0503 500 mg at 12/17/21 0503   morphine (PF) 2 MG/ML injection 2 mg  2 mg Intravenous Q2H PRN Sharlotte Alamo, DPM       ondansetron [facility]) tablet 4 mg  4 mg Oral Q6H PRN Sharlotte Alamo, DPM       Or   ondansetron [facility]) injection 4 mg  4 mg Intravenous Q6H PRN Sharlotte Alamo, DPM       QUEtiapine (SEROQUEL) tablet 25 mg  25 mg Oral QHS Sharlotte Alamo, DPM   25 mg at 12/16/21 2101   rOPINIRole (REQUIP) tablet 0.5 mg  0.5 mg Oral QHS Sharlotte Alamo, DPM   0.5 mg at 12/16/21 2101   vancomycin (VANCOCIN) IVPB 1000 mg/200 mL premix  1,000 mg Intravenous Once Sharlotte Alamo, DPM       vancomycin (VANCOCIN) IVPB 1000 mg/200 mL premix  1,000 mg Intravenous Q12H Sharlotte Alamo, DPM 200 mL/hr at 12/17/21 0604 1,000 mg at 12/17/21 0604   vitamin B-12 (CYANOCOBALAMIN) tablet 1,000 mcg  1,000 mcg Oral Daily Sharlotte Alamo, DPM   1,000 mcg at 12/17/21 [id number]     Abtx:  Anti-infectives (From admission, onward)    Start     Dose/Rate Route Frequency Ordered Stop   12/17/21 0600  vancomycin (VANCOCIN) IVPB 1000 mg/200 mL premix        1,000 mg 200 mL/hr over 60 Minutes Intravenous Every 12 hours 12/16/21 1401     12/17/21 0000  amoxicillin-clavulanate (AUGMENTIN) 875-125 MG tablet        1 tablet Oral 2 times daily 12/17/21 1300 12/24/21 2359   12/16/21 1415  vancomycin (VANCOCIN) IVPB 1000 mg/200 mL premix        1,000 mg 200 mL/hr over 60 Minutes Intravenous  Once  12/16/21 1400     12/16/21 1400  metroNIDAZOLE (FLAGYL) IVPB 500 mg        500 mg 100 mL/hr over 60 Minutes Intravenous Every 8 hours 12/16/21 1334 12/23/21 1359   12/16/21 1400  cefTRIAXone (  ROCEPHIN) 2 g in sodium chloride 0.9 % 100 mL IVPB        2 g 200 mL/hr over 30 Minutes Intravenous Every 24 hours 12/16/21 1335 12/23/21 1359   12/16/21 1345  cefTRIAXone (ROCEPHIN) 2 g in sodium chloride 0.9 % 100 mL IVPB  Status:  Discontinued        2 g 200 mL/hr over 30 Minutes Intravenous Every 24 hours 12/16/21 1334 12/16/21 1335   12/16/21 1215  ceFEPIme (MAXIPIME) 2 g in sodium chloride 0.9 % 100 mL IVPB  Status:  Discontinued        2 g 200 mL/hr over 30 Minutes Intravenous  Once 12/16/21 1210 12/16/21 1342   12/16/21 1215  vancomycin (VANCOCIN) IVPB 1000 mg/200 mL premix        1,000 mg 200 mL/hr over 60 Minutes Intravenous  Once 12/16/21 1210 12/16/21 1338       REVIEW OF SYSTEMS:  Const: negative fever, negative chills, negative weight loss Eyes: negative diplopia or visual changes, negative eye pain ENT: negative coryza, negative sore throat Resp: negative cough, hemoptysis, dyspnea Cards: negative for chest pain, palpitations, lower extremity edema GU: negative for frequency, dysuria and hematuria GI: Negative for abdominal pain, diarrhea, bleeding, constipation Skin: negative for rash and pruritus Heme: negative for easy bruising and gum/nose bleeding MS: As above Neurolo:negative for headaches, dizziness, vertigo, memory problems  Psych: negative for feelings of anxiety, depression  Endocrine: negative for thyroid, diabetes Allergy/Immunology- negative for any medication or food allergies ? Pertinent Positives include : Objective:  VITALS:  BP (!) 99/58 (BP Location: Left Arm)    Pulse 67    Temp 98 F (36.7 C) (Oral)    Resp 18    Ht 6\' 2"  (1.88 m)    Wt 104 kg    SpO2 98%    BMI 29.44 kg/m  PHYSICAL EXAM:  General: Alert, cooperative, no distress, appears stated age.   Head: Normocephalic, without obvious abnormality, atraumatic. Eyes: Conjunctivae clear, anicteric sclerae. Pupils are equal ENT Nares normal. No drainage or sinus tenderness. Lips, mucosa, and tongue normal. No Thrush Neck: Supple, symmetrical, no adenopathy, thyroid: non tender no carotid bruit and no JVD. Back: No CVA tenderness. Lungs: Clear to auscultation bilaterally. No Wheezing or Rhonchi. No rales. Heart: Regular rate and rhythm, no murmur, rub or gallop. Abdomen: Soft, non-tender,not distended. Bowel sounds normal. No masses Extremities: Left great toe distal phalanx amputated Sutures are intact Looks like a therapeutic amputation No erythema or discharge    Chronic ulcer on the right plantar aspect of the first MTP.  Looks clean    skin: No rashes or lesions. Or bruising Lymph: Cervical, supraclavicular normal. Neurologic: Grossly non-focal Pertinent Labs Lab Results CBC    Component Value Date/Time   WBC 7.1 12/17/2021 0555   RBC 4.16 (L) 12/17/2021 0555   HGB 12.2 (L) 12/17/2021 0555   HCT 37.6 (L) 12/17/2021 0555   PLT 243 12/17/2021 0555   MCV 90.4 12/17/2021 0555   MCH 29.3 12/17/2021 0555   MCHC 32.4 12/17/2021 0555   RDW 15.2 12/17/2021 0555   LYMPHSABS 2.1 12/16/2021 1049   MONOABS 0.3 12/16/2021 1049   EOSABS 0.4 12/16/2021 1049   BASOSABS 0.1 12/16/2021 1049    CMP Latest Ref Rng & Units 12/17/2021 12/17/2021 12/16/2021  Glucose 70 - 99 mg/dL - 211(H) 149(H)  BUN 8 - 23 mg/dL - 23 17  Creatinine 0.61 - 1.24 mg/dL - 1.48(H) 1.37(H)  Sodium 135 - 145 mmol/L -  134(L) 135  Potassium 3.5 - 5.1 mmol/L 5.2(H) 5.3(H) 4.4  Chloride 98 - 111 mmol/L - 101 99  CO2 22 - 32 mmol/L - 23 24  Calcium 8.9 - 10.3 mg/dL - 9.5 9.9  Total Protein 6.5 - 8.1 g/dL - - 9.1(H)  Total Bilirubin 0.3 - 1.2 mg/dL - - 0.6  Alkaline Phos 38 - 126 U/L - - 83  AST 15 - 41 U/L - - 20  ALT 0 - 44 U/L - - 13      Microbiology: Recent Results (from the past 240 hour(s))   Blood culture (routine x 2)     Status: None (Preliminary result)   Collection Time: 12/16/21 10:49 AM   Specimen: BLOOD  Result Value Ref Range Status   Specimen Description BLOOD RIGHT [facility]  Final   Special Requests   Final    BOTTLES DRAWN AEROBIC AND ANAEROBIC Blood Culture adequate volume   Culture   Final    NO GROWTH < 24 HOURS Performed at [facility], [address], [place], [place] [address]    Report Status PENDING  Incomplete  Resp Panel by RT-PCR (Flu A&B, Covid) Nasopharyngeal Swab     Status: None   Collection Time: 12/16/21 11:38 AM   Specimen: Nasopharyngeal Swab; Nasopharyngeal(NP) swabs in vial transport medium  Result Value Ref Range Status   SARS Coronavirus 2 by RT PCR NEGATIVE NEGATIVE Final    Comment: (NOTE) SARS-CoV-2 target nucleic acids are NOT DETECTED.  The SARS-CoV-2 RNA is generally detectable in upper respiratory specimens during the acute phase of infection. The lowest concentration of SARS-CoV-2 viral copies this assay can detect is 138 copies/mL. A negative result does not preclude SARS-Cov-2 infection and should not be used as the sole basis for treatment or other patient management decisions. A negative result may occur with  improper specimen collection/handling, submission of specimen other than nasopharyngeal swab, presence of viral mutation(s) within the areas targeted by this assay, and inadequate number of viral copies(<138 copies/mL). A negative result must be combined with clinical observations, patient history, and epidemiological information. The expected result is Negative.  Fact Sheet for Patients:  EntrepreneurPulse.com.au  Fact Sheet for Healthcare Providers:  IncredibleEmployment.be  This test is no t yet approved or cleared by the [place] FDA and  has been authorized for detection and/or diagnosis of SARS-CoV-2 by FDA under an Emergency Use Authorization (EUA). This EUA  will remain  in effect (meaning this test can be used) for the duration of the COVID-19 declaration under Section 564(b)(1) of the Act, 21 U.S.C.section 360bbb-3(b)(1), unless the authorization is terminated  or revoked sooner.       Influenza A by PCR NEGATIVE NEGATIVE Final   Influenza B by PCR NEGATIVE NEGATIVE Final    Comment: (NOTE) The Xpert Xpress SARS-CoV-2/FLU/RSV plus assay is intended as an aid in the diagnosis of influenza from Nasopharyngeal swab specimens and should not be used as a sole basis for treatment. Nasal washings and aspirates are unacceptable for Xpert Xpress SARS-CoV-2/FLU/RSV testing.  Fact Sheet for Patients: EntrepreneurPulse.com.au  Fact Sheet for Healthcare Providers: IncredibleEmployment.be  This test is not yet approved or cleared by the [place] FDA and has been authorized for detection and/or diagnosis of SARS-CoV-2 by FDA under an Emergency Use Authorization (EUA). This EUA will remain in effect (meaning this test can be used) for the duration of the COVID-19 declaration under Section 564(b)(1) of the Act, 21 U.S.C. section 360bbb-3(b)(1), unless the authorization is  terminated or revoked.  Performed at [facility], [address] [place], [place] [address]   Blood culture (routine x 2)     Status: None (Preliminary result)   Collection Time: 12/16/21 11:38 AM   Specimen: BLOOD  Result Value Ref Range Status   Specimen Description BLOOD RIGHT [facility]  Final   Special Requests   Final    BOTTLES DRAWN AEROBIC AND ANAEROBIC Blood Culture results may not be optimal due to an excessive volume of blood received in culture bottles   Culture   Final    NO GROWTH < 24 HOURS Performed at [facility], [address] [place], [place] [address]    Report Status PENDING  Incomplete  MRSA Next Gen by PCR, Nasal     Status: None   Collection Time: 12/16/21  2:47 PM   Specimen: Nasal  Mucosa; Nasal Swab  Result Value Ref Range Status   MRSA by PCR Next Gen NOT DETECTED NOT DETECTED Final    Comment: (NOTE) The GeneXpert MRSA Assay (FDA approved for NASAL specimens only), is one component of a comprehensive MRSA colonization surveillance program. It is not intended to diagnose MRSA infection nor to guide or monitor treatment for MRSA infections. Test performance is not FDA approved in patients less than 23 years old. Performed at [facility] [address] [place], [place] [address]   Aerobic/Anaerobic Culture w Gram Stain (surgical/deep wound)     Status: None (Preliminary result)   Collection Time: 12/16/21  5:35 PM   Specimen: PATH Digit amputation; Tissue  Result Value Ref Range Status   Specimen Description   Final    TOE LEFT Performed at [facility], [address] [place], [place] [address]    Special Requests   Final    NONE Performed at [facility], [address] [place], [place] [address]    Gram Stain   Final    NO WBC SEEN NO ORGANISMS SEEN Performed at [facility], [place] [address] [place],  [address]    Culture PENDING  Incomplete   Report Status PENDING  Incomplete    IMAGING RESULTS: I have personally reviewed the films ? Impression/Recommendation ? Diabetic foot infection. Left great toe infection.  With osteomyelitis of the distal phalanx.  Status post amputation of the distal phalanx.  This looks like a curative amputation. So the IV antibiotics can be changed to p.o. Augmentin for 7 more days just to treat any residual soft tissue infection if present.  right foot.   superficial ulcer.  Does not look infected.  Diabetes mellitus.  On Jardiance.  Recent CVA Patient is getting aspirin, Plavix and Lipitor.  ___________________________________________________ Discussed with patient, requesting provider. Discussed with Dr. Cleda Mccreedy Patient will be followed by Dr. Cleda Mccreedy  as outpatient.   Note:  This document was prepared using Dragon voice recognition software and may include unintentional dictation errors.

## 2021-12-17 NOTE — Progress Notes (Incomplete)
PROGRESS NOTE    [name]  [id number] DOB: 1956/10/27 DOA: 12/16/2021 PCP: Alm Bustard, NP (Confirm with patient/family/NH records and if not entered, this HAS to be entered at [facility] point of entry. "No PCP" if truly none.)   Chief Complaint  Patient presents with   Wound Infection    Left Big Toe     Brief Narrative: (Start on day 1 of progress note - keep it brief and live) ***   Assessment & Plan:   Principal Problem:   Osteomyelitis of ankle or foot, acute, left (HCC) Active Problems:   CVA (cerebral vascular accident) (HCC)   Diabetes mellitus type 2, uncomplicated (HCC)   Gout   Lactic acidosis   ***   DVT prophylaxis: (Lovenox/Heparin/SCD's/anticoagulated/None (if comfort care) Code Status: (Full/Partial - specify details) Family Communication: (Specify name, relationship & date discussed. NO "discussed with patient") Disposition:   Status is: Inpatient {Inpatient:23812}           Consultants:  ***  Procedures: (Don't include imaging studies which can be auto populated. Include things that cannot be auto populated i.e. Echo, Carotid and venous dopplers, Foley, Bipap, HD, tubes/drains, wound vac, central lines etc) ***  Antimicrobials: (specify start and planned stop date. Auto populated tables are space occupying and do not give end dates) ***    Subjective: Sitting up in bed.  States he feels well.  Asking when he can go home.  No chest pain.  No shortness of breath.  No abdominal pain.  Patient states as a 7-day supply of amoxicillin at home prior to admission.  Objective: Vitals:   12/16/21 1851 12/17/21 0006 12/17/21 0430 12/17/21 0742  BP: 126/68 107/69 107/62 (!) 99/58  Pulse:  79 82 67  Resp: 18 15 16 18   Temp:  98.1 F (36.7 C) 98 F (36.7 C) 98 F (36.7 C)  TempSrc:  Oral  Oral  SpO2: 100% 97% 97% 98%  Weight:      Height:        Intake/Output Summary (Last 24 hours) at 12/17/2021 1111 Last data filed at  12/17/2021 0604 Gross per 24 hour  Intake 3382.53 ml  Output 480 ml  Net 2902.53 ml   Filed Weights   12/16/21 1046 12/16/21 1705  Weight: 104 kg 104 kg    Examination:  General exam: Appears calm and comfortable  Respiratory system: Clear to auscultation. Respiratory effort normal. Cardiovascular system: S1 & S2 heard, RRR. No JVD, murmurs, rubs, gallops or clicks. No pedal edema. Gastrointestinal system: Abdomen is nondistended, soft and nontender. No organomegaly or masses felt. Normal bowel sounds heard. Central nervous system: Alert and oriented. No focal neurological deficits. Extremities: Symmetric 5 x 5 power. Skin: No rashes, lesions or ulcers Psychiatry: Judgement and insight appear normal. Mood & affect appropriate.     Data Reviewed: I have personally reviewed following labs and imaging studies  CBC: Recent Labs  Lab 12/16/21 1049 12/17/21 0555  WBC 7.9 7.1  NEUTROABS 4.9  --   HGB 13.6 12.2*  HCT 44.1 37.6*  MCV 93.8 90.4  PLT 304 243    Basic Metabolic Panel: Recent Labs  Lab 12/16/21 1049 12/17/21 0555 12/17/21 0759  NA 135 134*  --   K 4.4 5.3* 5.2*  CL 99 101  --   CO2 24 23  --   GLUCOSE 149* 211*  --   BUN 17 23  --   CREATININE 1.37* 1.48*  --   CALCIUM 9.9 9.5  --  GFR: Estimated Creatinine Clearance: 62.3 mL/min (A) (by C-G formula based on SCr of 1.48 mg/dL (H)).  Liver Function Tests: Recent Labs  Lab 12/16/21 1049  AST 20  ALT 13  ALKPHOS 83  BILITOT 0.6  PROT 9.1*  ALBUMIN 4.2    CBG: Recent Labs  Lab 12/16/21 1803 12/16/21 2001 12/16/21 2354 12/17/21 0425 12/17/21 0746  GLUCAP 103* 138* 223* 185* 226*     Recent Results (from the past 240 hour(s))  Blood culture (routine x 2)     Status: None (Preliminary result)   Collection Time: 12/16/21 10:49 AM   Specimen: BLOOD  Result Value Ref Range Status   Specimen Description BLOOD RIGHT [facility]  Final   Special Requests   Final    BOTTLES DRAWN AEROBIC AND  ANAEROBIC Blood Culture adequate volume   Culture   Final    NO GROWTH < 24 HOURS Performed at [facility], [address], [place], [place] [address]    Report Status PENDING  Incomplete  Resp Panel by RT-PCR (Flu A&B, Covid) Nasopharyngeal Swab     Status: None   Collection Time: 12/16/21 11:38 AM   Specimen: Nasopharyngeal Swab; Nasopharyngeal(NP) swabs in vial transport medium  Result Value Ref Range Status   SARS Coronavirus 2 by RT PCR NEGATIVE NEGATIVE Final    Comment: (NOTE) SARS-CoV-2 target nucleic acids are NOT DETECTED.  The SARS-CoV-2 RNA is generally detectable in upper respiratory specimens during the acute phase of infection. The lowest concentration of SARS-CoV-2 viral copies this assay can detect is 138 copies/mL. A negative result does not preclude SARS-Cov-2 infection and should not be used as the sole basis for treatment or other patient management decisions. A negative result may occur with  improper specimen collection/handling, submission of specimen other than nasopharyngeal swab, presence of viral mutation(s) within the areas targeted by this assay, and inadequate number of viral copies(<138 copies/mL). A negative result must be combined with clinical observations, patient history, and epidemiological information. The expected result is Negative.  Fact Sheet for Patients:  BloggerCourse.com  Fact Sheet for Healthcare Providers:  SeriousBroker.it  This test is no t yet approved or cleared by the [place] FDA and  has been authorized for detection and/or diagnosis of SARS-CoV-2 by FDA under an Emergency Use Authorization (EUA). This EUA will remain  in effect (meaning this test can be used) for the duration of the COVID-19 declaration under Section 564(b)(1) of the Act, 21 U.S.C.section 360bbb-3(b)(1), unless the authorization is terminated  or revoked sooner.       Influenza A by PCR  NEGATIVE NEGATIVE Final   Influenza B by PCR NEGATIVE NEGATIVE Final    Comment: (NOTE) The Xpert Xpress SARS-CoV-2/FLU/RSV plus assay is intended as an aid in the diagnosis of influenza from Nasopharyngeal swab specimens and should not be used as a sole basis for treatment. Nasal washings and aspirates are unacceptable for Xpert Xpress SARS-CoV-2/FLU/RSV testing.  Fact Sheet for Patients: BloggerCourse.com  Fact Sheet for Healthcare Providers: SeriousBroker.it  This test is not yet approved or cleared by the [place] FDA and has been authorized for detection and/or diagnosis of SARS-CoV-2 by FDA under an Emergency Use Authorization (EUA). This EUA will remain in effect (meaning this test can be used) for the duration of the COVID-19 declaration under Section 564(b)(1) of the Act, 21 U.S.C. section 360bbb-3(b)(1), unless the authorization is terminated or revoked.  Performed at [facility], [address] [place], [place] [address]  Blood culture (routine x 2)     Status: None (Preliminary result)   Collection Time: 12/16/21 11:38 AM   Specimen: BLOOD  Result Value Ref Range Status   Specimen Description BLOOD RIGHT AC  Final   Special Requests   Final    BOTTLES DRAWN AEROBIC AND ANAEROBIC Blood Culture results may not be optimal due to an excessive volume of blood received in culture bottles   Culture   Final    NO GROWTH < 24 HOURS Performed at [facility], [address] [place], [place] [address]    Report Status PENDING  Incomplete  MRSA Next Gen by PCR, Nasal     Status: None   Collection Time: 12/16/21  2:47 PM   Specimen: Nasal Mucosa; Nasal Swab  Result Value Ref Range Status   MRSA by PCR Next Gen NOT DETECTED NOT DETECTED Final    Comment: (NOTE) The GeneXpert MRSA Assay (FDA approved for NASAL specimens only), is one component of a comprehensive MRSA colonization  surveillance program. It is not intended to diagnose MRSA infection nor to guide or monitor treatment for MRSA infections. Test performance is not FDA approved in patients less than 49 years old. Performed at [facility], [address] [place], [place] [address]   Aerobic/Anaerobic Culture w Gram Stain (surgical/deep wound)     Status: None (Preliminary result)   Collection Time: 12/16/21  5:35 PM   Specimen: PATH Digit amputation; Tissue  Result Value Ref Range Status   Specimen Description   Final    TOE LEFT Performed at [facility], [address] [place], [place] [address]    Special Requests   Final    NONE Performed at [facility], [address] [place], [place] [address]    Gram Stain   Final    NO WBC SEEN NO ORGANISMS SEEN Performed at [facility], [address] [place], [place] [address]    Culture PENDING  Incomplete   Report Status PENDING  Incomplete         Radiology Studies: No results found.      Scheduled Meds:  allopurinol  300 mg Oral Daily   aspirin  81 mg Oral Daily   atorvastatin  80 mg Oral Daily   buPROPion  150 mg Oral Daily   cholecalciferol  2,000 Units Oral Daily   gabapentin  900 mg Oral TID   insulin aspart  0-15 Units Subcutaneous Q4H   insulin glargine-yfgn  5 Units Subcutaneous Daily   magnesium oxide  400 mg Oral Daily   QUEtiapine  25 mg Oral QHS   rOPINIRole  0.5 mg Oral QHS   vitamin B-12  1,000 mcg Oral Daily   Continuous Infusions:  sodium chloride 125 mL/hr at 12/17/21 0815   cefTRIAXone (ROCEPHIN)  IV 2 g (12/16/21 1458)   metronidazole 500 mg (12/17/21 0503)   vancomycin     vancomycin 1,000 mg (12/17/21 0604)     LOS: 1 day    Time spent: ***    Ramiro Harvest, MD [facility]   To contact the attending provider between 7A-7P or the covering provider during after hours 7P-7A, please log into the web site [email or web address] and access using universal [facility] password for that web site. If you do not have the password, please call the hospital operator.  12/17/2021, 11:11 AM

## 2021-12-17 NOTE — Discharge Instructions (Signed)

## 2021-12-17 NOTE — Progress Notes (Signed)
Nutrition Brief Note  Patient identified for consult for wound healing.  Wt Readings from Last 15 Encounters:  12/16/21 104 kg  12/06/21 104.3 kg    Body mass index is 29.44 kg/m. Patient meets criteria for overweight status based on current BMI. Venous stasis ulcer to L toe and L foot; R foot DM foot ulcer. 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   Current diet order is Carb Modified. Patient was eating lunch during RD visit Labs and medications reviewed.   Patient requests information about DM diet. Discussed diet, reading labels, adequate protein intake, and portion control. Informed patient that RD would enter handout into Discharge Instructions.  No additional nutrition interventions warranted at this time. If additional nutrition issues arise, please consult RD.   Discharge order and summary for d/c to home entered.      Trenton Gammon, MS, RD, LDN Inpatient Clinical Dietitian RD pager # available in AMION  After hours/weekend pager # available in [facility]

## 2021-12-17 NOTE — Care Management CC44 (Signed)
Condition Code 44 Documentation Completed  Patient Details  Name: [name] MRN: [id number] Date of Birth: Sep 30, 1956   Condition Code 44 given:  Yes Patient signature on Condition Code 44 notice:  Yes Documentation of 2 MD's agreement:  Yes Code 44 added to claim:  Yes    Conception Oms, RN 12/17/2021, 1:51 PM

## 2021-12-20 LAB — SURGICAL PATHOLOGY

## 2021-12-21 LAB — AEROBIC/ANAEROBIC CULTURE W GRAM STAIN (SURGICAL/DEEP WOUND)
Culture: NO GROWTH
Gram Stain: NONE SEEN

## 2021-12-21 LAB — CULTURE, BLOOD (ROUTINE X 2)
Culture: NO GROWTH
Culture: NO GROWTH
Special Requests: ADEQUATE

## 2021-12-22 ENCOUNTER — Encounter (HOSPITAL_BASED_OUTPATIENT_CLINIC_OR_DEPARTMENT_OTHER): Payer: Medicare Other | Admitting: Internal Medicine

## 2021-12-22 ENCOUNTER — Other Ambulatory Visit: Payer: Self-pay

## 2021-12-22 DIAGNOSIS — L97512 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ed: Secondary | ICD-10-CM

## 2021-12-22 DIAGNOSIS — E11621 Type 2 diabetes mellitus with foot ulcer: Secondary | ICD-10-CM

## 2021-12-22 DIAGNOSIS — R2689 Other abnormalities of gait and mobility: Secondary | ICD-10-CM

## 2021-12-22 DIAGNOSIS — E1140 Type 2 diabetes mellitus with diabetic neuropathy, unspecified: Secondary | ICD-10-CM

## 2021-12-22 NOTE — Progress Notes (Signed)
SMAYAN, HACKBART ([id number]) Visit Report for 12/22/2021 Chief Complaint Document Details Patient Name: [name], [name] Date of Service: 12/22/2021 1:45 PM Medical Record Number: [id number] Patient Account Number: [id number] Date of Birth/Sex: 20-May-1956 (65 y.o. M) Treating RN: Donnamarie Poag Primary Care Provider: [name] Other Clinician: Referring Provider: [name] Treating Provider/Extender: Yaakov Guthrie in Treatment: 6 Information Obtained from: Patient Chief Complaint Right plantar foot wound Electronic Signature(s) Signed: 12/22/2021 3:05:20 PM By: Kalman Shan DO Entered By: Kalman Shan on 12/22/2021 14:59:54 [name] ([id number]) -------------------------------------------------------------------------------- HPI Details Patient Name: [name] Date of Service: 12/22/2021 1:45 PM Medical Record Number: [id number] Patient Account Number: [id number] Date of Birth/Sex: 1955-12-30 (65 y.o. M) Treating RN: Donnamarie Poag Primary Care Provider: [name] Other Clinician: Referring Provider: [name] Treating Provider/Extender: Yaakov Guthrie in Treatment: 6 History of Present Illness HPI Description: Admission 11/10/2021 [name] is a 66 year old male with a past medical history of uncontrolled type 2 diabetes with last hemoglobin K9X of 8.1 complicated by peripheral neuropathy that presents to the clinic for a 2-year history of nonhealing ulcer to the bottom of his right foot. He states this started out as a blister caused by a work boot. He reports receiving wound care when he resided in [place]. He is reestablishing his wound care in [facility] today. He is currently keeping the area clean and covered. He has insoles designed to help offload the wound bed. He currently denies signs of infection. 1/18; patient presents for follow-up. He has been using Hydrofera Blue to the wound bed. He has no issues or  complaints today. He has not received the defender.[name] He denies signs of infection. 2/1; patient presents for follow-up. He has been using Hydrofera Blue to the wound bed he states he received the defender boot and has been using it however he did not have it on today. He currently denies signs of infection to the right foot. Unfortunately he developed a wound to the left great toe over the past week. He states he received new orthotics and these caused a blister to the left great toe which turned into a wound. He has not been doing anything to the wound bed. He currently denies systemic signs of infection. 2/8; Patient presents for follow-up. 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 He states he is about 90% recovered. He has some mild weakness to the left side. He reports taking the antibiotics prescribed at last clinic visit. He reports using Hydrofera Blue for dressing changes. He only uses the offloading boot when he is at home. He uses open toed shoes to the right foot. He currently denies signs of infection. 2/15; patient presents for follow-up. He states he is still taking the antibiotics prescribed. He reports using the defender boot to the right foot and open toed shoe to the left foot however he is not wearing either today. He currently denies systemic signs of infection. He stated he cut down a tree last week and was outside doing yard work. 2/22; patient presents for follow-up. 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 He reports feeling well. He reports using the defender boot to the right leg and continues to use Hydrofera Blue with dressing changes. He denies systemic signs of infection. Electronic Signature(s) Signed: 12/22/2021 3:05:20 PM By: Kalman Shan DO Entered By: Kalman Shan on  12/22/2021 15:00:41 [name], [name]  ([id number]) -------------------------------------------------------------------------------- Physical Exam Details Patient Name: [name], [name] Date of Service: 12/22/2021 1:45 PM Medical Record Number: [id number] Patient Account Number: [id number] Date of Birth/Sex: Nov 12, 1955 (65 y.o. M) Treating RN: Donnamarie Poag Primary Care Provider: [name] Other Clinician: Referring Provider: [name] Treating Provider/Extender: Yaakov Guthrie in Treatment: 6 Constitutional . Psychiatric . Notes Right foot: To the plantar aspect there is a punched-out ulcer with granulation tissue Minimal circumferential callus. No drainage noted. No surrounding signs of infection. Electronic Signature(s) Signed: 12/22/2021 3:05:20 PM By: Kalman Shan DO Entered By: Kalman Shan on 12/22/2021 15:01:32 [name] ([id number]) -------------------------------------------------------------------------------- Physician Orders Details Patient Name: [name] Date of Service: 12/22/2021 1:45 PM Medical Record Number: [id number] Patient Account Number: [id number] Date of Birth/Sex: 1956/04/21 (65 y.o. M) Treating RN: Donnamarie Poag Primary Care Provider: [name] Other Clinician: Referring Provider: [name] Treating Provider/Extender: Yaakov Guthrie in Treatment: 6 Verbal / Phone Orders: No Diagnosis Coding Follow-up Appointments o Return Appointment in 2 weeks. o Nurse Visit as needed Bathing/ Shower/ Hygiene o May shower; gently cleanse wound with antibacterial soap, rinse and pat dry prior to dressing wounds - change dressing after shower or keep dressing dry; keep covered o No tub bath. Anesthetic (Use 'Patient Medications' Section for Anesthetic Order Entry) o Lidocaine applied to wound bed Edema Control - Lymphedema / Segmental Compressive Device / Other o Elevate legs to the level of the heart and pump ankles as often as possible o  Elevate leg(s) parallel to the floor when sitting. o DO YOUR BEST to sleep in the bed at night. DO NOT sleep in your recliner. Long hours of sitting in a recliner leads to swelling of the legs and/or potential wounds on your backside. Off-Loading o Foot Defender o - Right foot-MAKE SURE A SOCK OR STOCKINETTE COVERS YOUR SKIN AND NOT IN CONTACT WITH THE BOOT o Open toe surgical shoe - left Additional Orders / Instructions o Follow Nutritious Diet and Increase Protein Intake o Other: - Follow with Dr. Caryl Comes for left foot great toe post surgery Wound Treatment Wound #1 - Foot Wound Laterality: Plantar, Right Cleanser: Byram Ancillary Kit - 15 Day Supply (Generic) 1 x Per Day/15 Days Discharge Instructions: Use supplies as instructed; Kit contains: (15) Saline Bullets; (15) 3x3 Gauze; 15 pr Gloves Cleanser: Soap and Water 1 x Per Day/15 Days Discharge Instructions: Gently cleanse wound with antibacterial soap, rinse and pat dry prior to dressing wounds Cleanser: Wound Cleanser 1 x Per Day/15 Days Discharge Instructions: Wash your hands with soap and water. Remove old dressing, discard into plastic bag and place into trash. Cleanse the wound with Wound Cleanser prior to applying a clean dressing using gauze sponges, not tissues or cotton balls. Do not scrub or use excessive force. Pat dry using gauze sponges, not tissue or cotton balls. Topical: Gentamicin 1 x Per Day/15 Days Discharge Instructions: IN THE OFFICE ONLY-Apply as directed by provider. Primary Dressing: Hydrofera Blue Ready Transfer Foam, 2.5x2.5 (in/in) (Generic) 1 x Per Day/15 Days Discharge Instructions: CUT TO FIT THE WOUND BED-Apply Hydrofera Blue Ready to wound bed as directed Secondary Dressing: ABD Pad 5x9 (in/in) (Generic) 1 x Per Day/15 Days Discharge Instructions: cut to fit-Cover with ABD pad/may cut in half Secured With: [place] Surgical Tape, 2x2 (in/yd) (Generic) 1 x Per Day/15  Days Discharge Instructions: tape ABD to foot to hold Secured With: Conforming Stretch Gauze Bandage 4x75 (in/in) 1 x Per Day/15 Days Discharge  Instructions: Apply as directed ONNIE, HATCHEL ([id number]) Electronic Signature(s) Signed: 12/22/2021 3:05:20 PM By: Kalman Shan DO Entered By: Kalman Shan on 12/22/2021 15:04:47 [name] ([id number]) -------------------------------------------------------------------------------- Problem List Details Patient Name: [name] Date of Service: 12/22/2021 1:45 PM Medical Record Number: [id number] Patient Account Number: [id number] Date of Birth/Sex: 1955/12/15 (65 y.o. 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 11/10/2021 No Yes exposed L97.528 Non-pressure chronic ulcer of other part of left foot with other specified 12/01/2021 No Yes severity E11.621 Type 2 diabetes mellitus with foot ulcer 11/10/2021 No Yes E11.40 Type 2 diabetes mellitus with diabetic neuropathy, unspecified 11/10/2021 No Yes R26.89 Other abnormalities of gait and mobility 11/10/2021 No Yes Inactive Problems Resolved Problems Electronic Signature(s) Signed: 12/22/2021 3:05:20 PM By: Kalman Shan DO Entered By: Kalman Shan on 12/22/2021 14:57:08 [name] ([id number]) -------------------------------------------------------------------------------- Progress Note Details Patient Name: [name] Date of Service: 12/22/2021 1:45 PM Medical Record Number: [id number] Patient Account Number: [id number] Date of Birth/Sex: 1956/10/27 (65 y.o. M) Treating RN: Donnamarie Poag Primary Care Provider: [name] Other Clinician: Referring Provider: [name] Treating Provider/Extender: Yaakov Guthrie in Treatment: 6 Subjective Chief Complaint Information obtained from Patient Right plantar foot wound History of Present Illness (HPI) Admission 11/10/2021 [name] is a 66 year old male with a past medical history of uncontrolled type 2 diabetes with last hemoglobin J8H of 8.1 complicated by peripheral neuropathy that presents to the clinic for a 2-year history of nonhealing ulcer to the bottom of his right foot. He states this started out as a blister caused by a work boot. He reports receiving wound care when he resided in [place]. He is reestablishing his wound care in [facility] today. He is currently keeping the area clean and covered. He has insoles designed to help offload the wound bed. He currently denies signs of infection. 1/18; patient presents for follow-up. He has been using Hydrofera Blue to the wound bed. He has no issues or complaints today. He has not received the defender.[name] He denies signs of infection. 2/1; patient presents for follow-up. He has been using Hydrofera Blue to the wound bed he states he received the defender boot and has been using it however he did not have it on today. He currently denies signs of infection to the right foot. Unfortunately he developed a wound to the left great toe over the past week. He states he received new orthotics and these caused a blister to the left great toe which turned into a wound. He has not been doing anything to the wound bed. He currently denies systemic signs of infection. 2/8; Patient presents for follow-up. 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 He states he is about 90% recovered. He has some mild weakness to the left side. He reports taking the antibiotics prescribed at last clinic visit. He reports using Hydrofera Blue  for dressing changes. He only uses the offloading boot when he is at home. He uses open toed shoes to the right foot. He currently denies signs of infection. 2/15; patient presents for follow-up. He states he is still taking the antibiotics prescribed. He reports using the Agricultural engineer  boot to the right foot and open toed shoe to the left foot however he is not wearing either today. He currently denies systemic signs of infection. He stated he cut down a tree last week and was outside doing yard work. 2/22; patient presents for follow-up. 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 He reports feeling well. He reports using the defender boot to the right leg and continues to use Hydrofera Blue with dressing changes. He denies systemic signs of infection. Objective Constitutional Vitals Time Taken: 1:48 PM, Height: 74 in, Weight: 226 lbs, BMI: 29, Temperature: 98.3 F, Pulse: 97 bpm, Respiratory Rate: 16 breaths/min, Blood Pressure: 109/68 mmHg. General Notes: Right foot: To the plantar aspect there is a punched-out ulcer with granulation tissue Minimal circumferential callus. No drainage noted. No surrounding signs of infection. Integumentary (Hair, Skin) Wound #1 status is Open. Original cause of wound was Blister. The date acquired was: 07/15/2020. The wound has been in treatment 6 weeks. The wound is located on the [place]. The wound measures 1.5cm length x 0.8cm width x 0.4cm depth; 0.942cm^2 area and 0.377cm^3 volume. There is Fat Layer (Subcutaneous Tissue) exposed. There is no tunneling or undermining noted. There is a medium amount of serosanguineous drainage noted. The wound margin is thickened. There is large (67-100%) red, pink, hyper - granulation within the wound bed. There is a small (1-33%) amount of necrotic tissue within the wound bed including Adherent Slough. Wound #2 status is Amputation - Anticipated. Original cause of wound was Gradually  Appeared. The date acquired was: 11/25/2021. The wound has been in treatment 3 weeks. The wound is located on the Left Toe Great. There is Fat Layer (Subcutaneous Tissue) exposed. There is a medium amount of serosanguineous drainage noted. There is small (1-33%) red, hyper - granulation within the wound bed. There is a large (67- 100%) amount of necrotic tissue within the wound bed including Eschar and Adherent Slough. 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 I recommended Hydrofera Blue and aggressive offloading. He has a Conservation officer, nature but not sure how often he is using this since he does not have it on today. Patient had amputation of the left great toe on 2/16 by Dr. Unknown Jim Due to osteomyelitis. He states that the whole toe was not removed. This is currently bandaged. He denies systemic signs of infection. Plan Follow-up Appointments: Return Appointment in 2 weeks. Nurse Visit as needed Bathing/ Shower/ Hygiene: May shower; gently cleanse wound with antibacterial soap, rinse and pat dry prior to dressing wounds - change dressing after shower or keep dressing dry; keep covered No tub bath. Anesthetic (Use 'Patient Medications' Section for Anesthetic Order Entry): Lidocaine applied to wound bed Edema Control - Lymphedema / Segmental Compressive Device / Other: Elevate legs to the level of the heart and pump ankles as often as possible Elevate leg(s) parallel to the floor when sitting. DO YOUR BEST to sleep in the bed at night. DO NOT sleep in your recliner. Long hours of  sitting in a recliner leads to swelling of the legs and/or potential wounds on your backside. Off-Loading: Foot Defender  -  Right foot-MAKE SURE A SOCK OR STOCKINETTE COVERS YOUR SKIN AND NOT IN CONTACT WITH THE BOOT Open toe surgical shoe - left Additional Orders / Instructions: Follow Nutritious Diet and Increase Protein Intake Other: - Follow with Dr. Caryl Comes for left foot great toe post surgery WOUND #1: - Foot Wound Laterality: Plantar, Right Cleanser: Byram Ancillary Kit - 15 Day Supply (Generic) 1 x Per Day/15 Days Discharge Instructions: Use supplies as instructed; Kit contains: (15) Saline Bullets; (15) 3x3 Gauze; 15 pr Gloves Cleanser: Soap and Water 1 x Per Day/15 Days Discharge Instructions: Gently cleanse wound with antibacterial soap, rinse and pat dry prior to dressing wounds Cleanser: Wound Cleanser 1 x Per Day/15 Days Discharge Instructions: Wash your hands with soap and water. Remove old dressing, discard into plastic bag and place into trash. Cleanse the wound with Wound Cleanser prior to applying a clean dressing using gauze sponges, not tissues or cotton balls. Do not scrub or use excessive force. Pat dry using gauze sponges, not tissue or cotton balls. Topical: Gentamicin 1 x Per Day/15 Days Discharge Instructions: IN THE OFFICE ONLY-Apply as directed by provider. Primary Dressing: Hydrofera Blue Ready Transfer Foam, 2.5x2.5 (in/in) (Generic) 1 x Per Day/15 Days Discharge Instructions: CUT TO FIT THE WOUND BED-Apply Hydrofera Blue Ready to wound bed as directed Secondary Dressing: ABD Pad 5x9 (in/in) (Generic) 1 x Per Day/15 Days Discharge Instructions: cut to fit-Cover with ABD pad/may cut in half Secured With: [place] Surgical Tape, 2x2 (in/yd) (Generic) 1 x Per Day/15 Days Discharge Instructions: tape ABD to foot to hold Secured With: Conforming Stretch Gauze Bandage 4x75 (in/in) 1 x Per Day/15 Days Discharge Instructions: Apply as directed 1.  Hydrofera Blue 2. Aggressive offloading defender boot 3. Follow-up in 2 weeks Tess, Nature ([id number]) Electronic Signature(s) Signed: 12/22/2021 3:05:20 PM By: Kalman Shan DO Entered By: Kalman Shan on 12/22/2021 15:03:55 [name] ([id number]) -------------------------------------------------------------------------------- SuperBill Details Patient Name: [name] Date of Service: 12/22/2021 Medical Record Number: [id number] Patient Account Number: [id number] Date of Birth/Sex: [birth year] (65 y.o. M) Treating RN: Donnamarie Poag Primary Care Provider: [name] Other Clinician: Referring Provider: [name] Treating Provider/Extender: Yaakov Guthrie in Treatment: 6 Diagnosis Coding ICD-10 Codes Code Description [phone] 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ed E11.621 Type 2 diabetes mellitus with foot ulcer E11.40 Type 2 diabetes mellitus with diabetic neuropathy, unspecified R26.89 Other abnormalities of gait and mobility L97.528 Non-pressure chronic ulcer of other part of left foot with other specified severity Facility Procedures CPT4 Code: [id number] Description: [phone] - WOUND CARE VISIT-LEV 2 EST PT Modifier: Quantity: 1 Physician Procedures CPT4 Code: [id number] Description: 99213 - WC PHYS LEVEL 3 - EST PT Modifier: Quantity: 1 CPT4 Code: Description: ICD-10 Diagnosis Description L97.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

## 2021-12-22 NOTE — Progress Notes (Addendum)
AZIAH, BROSTROM ([id number]) Visit Report for 12/22/2021 Arrival Information Details Patient Name: [name], [name] Date of Service: 12/22/2021 1:45 PM Medical Record Number: [id number] Patient Account Number: [id number] Date of Birth/Sex: Oct 14, 1956 (65 y.o. M) Treating RN: Donnamarie Poag Primary Care Braylyn Kalter: [name] Other Clinician: Referring Gerianne Simonet: [name] Treating Jeral Zick/Extender: Yaakov Guthrie in Treatment: 6 Visit Information History Since Last Visit Added or deleted any medications: No Patient Arrived: Ambulatory Had a fall or experienced change in No Arrival Time: 13:44 activities of daily living that may affect Accompanied By: self risk of falls: Transfer Assistance: None Hospitalized since last visit: Yes Patient Identification Verified: Yes Has Dressing in Place as Prescribed: Yes Secondary Verification Process Completed: Yes Pain Present Now: Yes Patient Requires Transmission-Based No Precautions: Patient Has Alerts: Yes Patient Alerts: Patient on Blood Thinner DIABETIC Plavix Electronic Signature(s) Signed: 12/22/2021 1:59:49 PM By: Donnamarie Poag Entered By: Donnamarie Poag on 12/22/2021 13:49:38 [name] ([id number]) -------------------------------------------------------------------------------- Clinic Level of Care Assessment Details Patient Name: [name] Date of Service: 12/22/2021 1:45 PM Medical Record Number: [id number] Patient Account Number: [id number] Date of Birth/Sex: [birth year] (65 y.o. M) Treating RN: Donnamarie Poag Primary Care Aveer Bartow: [name] Other Clinician: Referring Bergen Melle: [name] Treating Kemiyah Tarazon/Extender: Yaakov Guthrie in Treatment: 6 Clinic Level of Care Assessment Items TOOL 4 Quantity Score _0  - Use when only an EandM is performed on FOLLOW-UP visit 0 ASSESSMENTS - Nursing Assessment / Reassessment _1  - Reassessment of Co-morbidities (includes updates in patient status) 0 _2  -  0 Reassessment of Adherence to Treatment Plan ASSESSMENTS - Wound and Skin Assessment / Reassessment X - Simple Wound Assessment / Reassessment - one wound 1 5 _3  - 0 Complex Wound Assessment / Reassessment - multiple wounds _4  - 0 Dermatologic / Skin Assessment (not related to wound area) ASSESSMENTS - Focused Assessment _5  - Circumferential Edema Measurements - multi extremities 0 _6  - 0 Nutritional Assessment / Counseling / Intervention _7  - 0 Lower Extremity Assessment (monofilament, tuning fork, pulses) _8  - 0 Peripheral Arterial Disease Assessment (using hand held doppler) ASSESSMENTS - Ostomy and/or Continence Assessment and Care _9  - Incontinence Assessment and Management 0 _10  - 0 Ostomy Care Assessment and Management (repouching, etc.) PROCESS - Coordination of Care X - Simple Patient / Family Education for ongoing care 1 15 _11  - 0 Complex (extensive) Patient / Family Education for ongoing care _12  - 0 Staff obtains Programmer, systems, Records, Test Results / Process Orders _13  - 0 Staff telephones HHA, Nursing Homes / Clarify orders / etc _14  - 0 Routine Transfer to another Facility (non-emergent condition) _15  - 0 Routine Hospital Admission (non-emergent condition) _16  - 0 New Admissions / Biomedical engineer / Ordering NPWT, Apligraf, etc. _17  - 0 Emergency Hospital Admission (emergent condition) X- 1 10 Simple Discharge Coordination _18  - 0 Complex (extensive) Discharge Coordination PROCESS - Special Needs _19  - Pediatric / Minor Patient Management 0 _20  - 0 Isolation Patient Management _21  - 0 Hearing / Language / Visual special needs _22  - 0 Assessment of Community assistance (transportation, D/C planning, etc.) _23  - 0 Additional assistance / Altered mentation _24  - 0 Support Surface(s) Assessment (bed, cushion, seat, etc.) INTERVENTIONS - Wound Cleansing / Measurement [name], [name] ([id number]) X- 1 5 Simple Wound Cleansing - one wound _25  - 0 Complex Wound  Cleansing - multiple wounds X- 1 5 Wound Imaging (photographs - any number of wounds) _26  - 0 Wound Tracing (instead of photographs) X- 1 5 Simple Wound Measurement - one wound _27  - 0 Complex  Wound Measurement - multiple wounds INTERVENTIONS - Wound Dressings X - Small Wound Dressing one or multiple wounds 1 10 _0  - 0 Medium Wound Dressing one or multiple wounds _1  - 0 Large Wound Dressing one or multiple wounds X- 1 5 Application of Medications - topical <[id number]>_3<\/KVQQVZDGLOVFIEPP>_2  - 0 Application of Medications - injection INTERVENTIONS - Miscellaneous _3  - External ear exam 0 _4  - 0 Specimen Collection (cultures, biopsies, blood, body fluids, etc.) _5  - 0 Specimen(s) / Culture(s) sent or taken to Lab for analysis _6  - 0 Patient Transfer (multiple staff / Harrel Lemon Lift / Similar devices) _7  - 0 Simple Staple / Suture removal (25 or less) _8  - 0 Complex Staple / Suture removal (26 or more) _9  - 0 Hypo / Hyperglycemic Management (close monitor of Blood Glucose) _10  - 0 Ankle / Brachial Index (ABI) - do not check if billed separately X- 1 5 Vital Signs Has the patient been seen at the hospital within the last three years: Yes Total Score: 65 Level Of Care: New/Established - Level 2 Electronic Signature(s) Signed: 12/22/2021 4:40:06 PM By: Donnamarie Poag Entered By: Donnamarie Poag on 12/22/2021 14:11:19 [name] ([id number]) -------------------------------------------------------------------------------- Encounter Discharge Information Details Patient Name: [name] Date of Service: 12/22/2021 1:45 PM Medical Record Number: [id number] Patient Account Number: [id number] Date of Birth/Sex: 1956-06-28 (65 y.o. M) Treating RN: Donnamarie Poag Primary Care Brie Eppard: [name] Other Clinician: Referring Alece Koppel: [name] Treating Mikeila Burgen/Extender: Yaakov Guthrie in Treatment: 6 Encounter Discharge Information Items Discharge Condition: Stable Ambulatory Status:  Ambulatory Discharge Destination: Home Transportation: Private Auto Accompanied By: self Schedule Follow-up Appointment: Yes Clinical Summary of Care: Electronic Signature(s) Signed: 12/22/2021 4:40:06 PM By: Donnamarie Poag Entered By: Donnamarie Poag on 12/22/2021 14:12:47 [name] ([id number]) -------------------------------------------------------------------------------- Lower Extremity Assessment Details Patient Name: [name] Date of Service: 12/22/2021 1:45 PM Medical Record Number: [id number] Patient Account Number: [id number] Date of Birth/Sex: 1956-02-27 (65 y.o. M) Treating RN: Donnamarie Poag Primary Care Javier Gell: [name] Other Clinician: Referring Giliana Vantil: [name] Treating Datha Kissinger/Extender: Yaakov Guthrie in Treatment: 6 Edema Assessment Assessed: [Left: No] [Right: Yes] Edema: [Left: N] [Right: o] Vascular Assessment Pulses: Dorsalis Pedis Palpable: [Right:Yes] Electronic Signature(s) Signed: 12/22/2021 1:59:49 PM By: Donnamarie Poag Entered By: Donnamarie Poag on 12/22/2021 13:54:56 [name] ([id number]) -------------------------------------------------------------------------------- Multi Wound Chart Details Patient Name: [name] Date of Service: 12/22/2021 1:45 PM Medical Record Number: [id number] Patient Account Number: [id number] Date of Birth/Sex: [birth year] (65 y.o. 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 Lower N/A Extremity Extremity Comorbid History: Type II Diabetes, Osteoarthritis Type II Diabetes, Osteoarthritis N/A Date Acquired: 07/15/2020 11/25/2021 N/A Weeks of Treatment: 6 3 N/A Wound Status: Open Amputation - Anticipated N/A Wound Recurrence: No No N/A Measurements L x W x D (cm) 1.5x0.8x0.4 N/A N/A Area (cm) : 0.942 N/A N/A Volume (cm) : 0.377 N/A N/A % Reduction in Area: 16.10% N/A N/A % Reduction in Volume: 44.10% N/A N/A Classification: Grade 2 Grade 2 N/A Exudate Amount: Medium Medium N/A Exudate Type: Serosanguineous Serosanguineous N/A Exudate Color: red, brown red, brown N/A Wound Margin: Thickened N/A N/A Granulation Amount: Large (67-100%)  Small (1-33%) N/A Granulation Quality: Red, Pink, Hyper-granulation Red, Hyper-granulation N/A Necrotic Amount: Small (1-33%) Large (67-100%) N/A Necrotic Tissue: Adherent [place] N/A Exposed Structures: Fat Layer (Subcutaneous Tissue): Fat Layer (Subcutaneous Tissue): N/A Yes Yes Fascia: No Fascia: No Tendon: No Tendon: No Muscle: No Muscle: No Joint: No Joint: No Bone: No Bone: No Epithelialization: None N/A N/A Assessment Notes: N/A distal end of left great toe N/A amputated Treatment Notes Electronic Signature(s) Signed: 12/22/2021 4:40:06 PM By: Donnamarie Poag Entered By: Donnamarie Poag on 12/22/2021 14:04:15 [name] ([id number]) [name] ([id number]) -------------------------------------------------------------------------------- Multi-Disciplinary Care Plan Details Patient Name: [name] Date of Service: 12/22/2021 1:45 PM Medical Record Number: [id number] Patient Account Number: [id number] Date of Birth/Sex: 1956/01/20 (65 y.o. M) Treating RN: Donnamarie Poag Primary Care Alma Muegge: [name] Other Clinician: Referring Paiden Caraveo: [name] Treating Sahaj Bona/Extender: Yaakov Guthrie in Treatment: 6 Active Inactive Wound/Skin Impairment Nursing  Diagnoses: Impaired tissue integrity Knowledge deficit related to smoking impact on wound healing Knowledge deficit related to ulceration/compromised skin integrity Goals: Patient/caregiver will verbalize understanding of skin care regimen Date Initiated: 11/10/2021 Date Inactivated: 12/01/2021 Target Resolution Date: 11/17/2021 Goal Status: Met Ulcer/skin breakdown will have a volume reduction of 30% by week 4 Date Initiated: 11/10/2021 Target Resolution Date: 12/08/2021 Goal Status: Active Ulcer/skin breakdown will have a volume reduction of 50% by week 8 Date Initiated: 11/10/2021 Target Resolution Date: 01/05/2022 Goal Status: Active Ulcer/skin breakdown will have a volume reduction of 80% by week 12 Date Initiated: 11/10/2021 Target Resolution Date: 02/02/2022 Goal Status: Active Ulcer/skin breakdown will heal within 14 weeks Date Initiated: 11/10/2021 Target Resolution Date: 02/16/2022 Goal Status: Active Interventions: Assess patient/caregiver ability to obtain necessary supplies Assess patient/caregiver ability to perform ulcer/skin care regimen upon admission and as needed Assess ulceration(s) every visit Notes: Electronic Signature(s) Signed: 12/22/2021 4:40:06 PM By: Donnamarie Poag Entered By: Donnamarie Poag on 12/22/2021 14:03:55 [name] ([id number]) -------------------------------------------------------------------------------- Pain Assessment Details Patient Name: [name] Date of Service: 12/22/2021 1:45 PM Medical Record Number: [id number] Patient Account Number: [id number] Date of Birth/Sex: 01/17/1956 (65 y.o. M) Treating RN: Donnamarie Poag Primary Care Alilah Mcmeans: [name] Other Clinician: Referring Jesslynn Kruck: [name] Treating Keylen Eckenrode/Extender: Yaakov Guthrie in Treatment: 6 Active Problems Location of Pain Severity and Description of Pain Patient Has Paino Yes Site Locations Pain Location: Generalized Pain, Pain in Ulcers Rate the  pain. Current Pain Level: 5 Pain Management and Medication Current Pain Management: Electronic Signature(s) Signed: 12/22/2021 1:59:49 PM By: Donnamarie Poag Entered By: Donnamarie Poag on 12/22/2021 13:50:13 [name] ([id number]) -------------------------------------------------------------------------------- Patient/Caregiver Education Details Patient Name: [name] Date of Service: 12/22/2021 1:45 PM Medical Record Number: [id number] Patient Account Number: [id number] Date of Birth/Gender: [birth year] (65 y.o. M) Treating RN: Donnamarie Poag Primary Care Physician: [name] Other Clinician: Referring Physician: [name] Treating Physician/Extender: Yaakov Guthrie in Treatment: 6 Education Assessment Education Provided To: Patient Education Topics Provided Basic Hygiene: Elevated Blood Sugar/ Impact on Healing: Infection: Offloading: Wound/Skin Impairment: Electronic Signature(s) Signed: 12/22/2021 4:40:06 PM By: Donnamarie Poag Entered By: Donnamarie Poag on 12/22/2021 14:12:02 [name] ([id number]) -------------------------------------------------------------------------------- Wound Assessment Details Patient Name: [name] Date of Service: 12/22/2021 1:45 PM Medical Record Number: [id number] Patient Account Number: [id number] Date of Birth/Sex: [birth year] (65 y.o. M) Treating RN: Donnamarie Poag Primary Care Anaiz Qazi: [name] Other Clinician: Referring Rithik Odea: [name] Treating Trevar Boehringer/Extender: Yaakov Guthrie in Treatment: 6 Wound Status Wound Number: 1 Primary Etiology: Diabetic Wound/Ulcer of the Lower Extremity Wound Location: Right, Plantar Foot Wound Status:  Open Wounding Event: Blister Comorbid History: Type II Diabetes, Osteoarthritis Date Acquired: 07/15/2020 Weeks Of Treatment: 6 Clustered Wound: No Photos Wound Measurements Length: (cm) 1.5 Width: (cm) 0.8 Depth: (cm) 0.4 Area: (cm) 0.942 Volume: (cm)  0.377 % Reduction in Area: 16.1% % Reduction in Volume: 44.1% Epithelialization: None Tunneling: No Undermining: No Wound Description Classification: Grade 2 Wound Margin: Thickened Exudate Amount: Medium Exudate Type: Serosanguineous Exudate Color: red, brown Foul Odor After Cleansing: No Slough/Fibrino Yes Wound Bed Granulation Amount: Large (67-100%) Exposed Structure Granulation Quality: Red, Pink, Hyper-granulation Fascia Exposed: No Necrotic Amount: Small (1-33%) Fat Layer (Subcutaneous Tissue) Exposed: Yes Necrotic Quality: Adherent Slough Tendon Exposed: No Muscle Exposed: No Joint Exposed: No Bone Exposed: No Treatment Notes Wound #1 (Foot) Wound Laterality: Plantar, Right Cleanser [place] Ancillary Kit - 15 Day Supply Discharge Instruction: Use supplies as instructed; Kit contains: (15) Saline Bullets; (15) 3x3 Gauze; 15 pr Gloves Soap and Water [name], [name] ([id number]) Discharge Instruction: Gently cleanse wound with antibacterial soap, rinse and pat dry prior to dressing wounds Wound Cleanser Discharge Instruction: Wash your hands with soap and water. Remove old dressing, discard into plastic bag and place into trash. Cleanse the wound with Wound Cleanser prior to applying a clean dressing using gauze sponges, not tissues or cotton balls. Do not scrub or use excessive force. Pat dry using gauze sponges, not tissue or cotton balls. Peri-Wound Care Topical Gentamicin Discharge Instruction: IN THE OFFICE ONLY-Apply as directed by Marquavion Venhuizen. Primary Dressing Hydrofera Blue Ready Transfer Foam, 2.5x2.5 (in/in) Discharge Instruction: CUT TO FIT THE WOUND BED-Apply Hydrofera Blue Ready to wound bed as directed Secondary Dressing ABD Pad 5x9 (in/in) Discharge Instruction: cut to fit-Cover with ABD pad/may cut in half Secured With [place] [facility] Tape, 2x2 (in/yd) Discharge Instruction: tape ABD to foot to hold Conforming Stretch Gauze Bandage  4x75 (in/in) Discharge Instruction: Apply as directed Compression Wrap Compression Stockings Add-Ons Electronic Signature(s) Signed: 12/22/2021 1:59:49 PM By: Donnamarie Poag Entered By: Donnamarie Poag on 12/22/2021 13:54:20 [name] ([id number]) -------------------------------------------------------------------------------- Wound Assessment Details Patient Name: [name] Date of Service: 12/22/2021 1:45 PM Medical Record Number: [id number] Patient Account Number: [id number] Date of Birth/Sex: January 10, 1956 (65 y.o. M) Treating RN: Donnamarie Poag Primary Care Gowri Suchan: [name] Other Clinician: Referring Aariana Shankland: [name] Treating Lequisha Cammack/Extender: Yaakov Guthrie in Treatment: 6 Wound Status Wound Number: 2 Primary Etiology: Diabetic Wound/Ulcer of the Lower Extremity Wound Location: Left Toe Great Wound Status: Amputation - Anticipated Wounding Event: Gradually Appeared Outcome Level: Digit/Ray Date Acquired: 11/25/2021 Notes: pending amputation on presentation noted this wound #2 Weeks Of Treatment: 3 Comorbid Type II Diabetes, Osteoarthritis Clustered Wound: No History: Wound Measurements % Reduction in Area: % Reduction in Volume: Wound Description Classification: Grade 2 Exudate Amount: Medium Exudate Type: Serosanguineous Exudate Color: red, brown Foul Odor After Cleansing: No Slough/Fibrino Yes Wound Bed Granulation Amount: Small (1-33%) Exposed Structure Granulation Quality: Red, Hyper-granulation Fascia Exposed: No Necrotic Amount: Large (67-100%) Fat Layer (Subcutaneous Tissue) Exposed: Yes Necrotic Quality: Eschar, Adherent Slough Tendon Exposed: No Muscle Exposed: No Joint Exposed: No Bone Exposed: No Assessment Notes 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 M) Treating RN: Donnamarie Poag Primary Care Faythe Heitzenrater: [name] Other Clinician: Referring Janit Cutter: Oneida Alar,  Glenda Treating Lakima Dona/Extender: Yaakov Guthrie in Treatment: 6 Vital Signs Time Taken: 13:48 Temperature (F): 98.3 Height (in): 74 Pulse (bpm): 97 Weight (lbs): 226 Respiratory Rate (breaths/min): 16 Body Mass Index (BMI): 29 Blood Pressure (mmHg): 109/68 Reference Range: 80 - 120 mg / dl Electronic Signature(s) Signed: 12/22/2021 1:59:49 PM By: Donnamarie Poag Entered ByDonnamarie Poag on 12/22/2021 13:49:55

## 2022-01-05 ENCOUNTER — Other Ambulatory Visit: Payer: Self-pay

## 2022-01-05 ENCOUNTER — Other Ambulatory Visit: Payer: Self-pay | Admitting: Internal Medicine

## 2022-01-05 ENCOUNTER — Ambulatory Visit
Admission: RE | Admit: 2022-01-05 | Discharge: 2022-01-05 | Disposition: A | Discharge status: Home / Self Care | Payer: Medicare Other | Source: Ambulatory Visit | Attending: Internal Medicine | Admitting: Internal Medicine

## 2022-01-05 ENCOUNTER — Encounter: Payer: Medicare Other | Attending: Internal Medicine | Admitting: Internal Medicine

## 2022-01-05 DIAGNOSIS — L97528 Non-pressure chronic ulcer of other part of left foot with other specified severity: Secondary | ICD-10-CM | POA: Insufficient documentation

## 2022-01-05 DIAGNOSIS — L97512 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ed: Secondary | ICD-10-CM | POA: Insufficient documentation

## 2022-01-05 DIAGNOSIS — E1165 Type 2 diabetes mellitus with hyperglycemia: Secondary | ICD-10-CM | POA: Insufficient documentation

## 2022-01-05 DIAGNOSIS — E1142 Type 2 diabetes mellitus with diabetic polyneuropathy: Secondary | ICD-10-CM | POA: Diagnosis not present

## 2022-01-05 DIAGNOSIS — S81801A Unspecified open wound, right lower leg, initial encounter: Secondary | ICD-10-CM

## 2022-01-05 DIAGNOSIS — R2689 Other abnormalities of gait and mobility: Secondary | ICD-10-CM | POA: Diagnosis not present

## 2022-01-05 DIAGNOSIS — E11621 Type 2 diabetes mellitus with foot ulcer: Secondary | ICD-10-CM | POA: Diagnosis not present

## 2022-01-05 DIAGNOSIS — Z89412 Acquired absence of left great toe: Secondary | ICD-10-CM | POA: Insufficient documentation

## 2022-01-05 DIAGNOSIS — E1140 Type 2 diabetes mellitus with diabetic neuropathy, unspecified: Secondary | ICD-10-CM

## 2022-01-05 NOTE — Progress Notes (Addendum)
[name] ([id number]) Visit Report for 01/05/2022 Chief Complaint Document Details Patient Name: [name] Date of Service: 01/05/2022 9:15 AM Medical Record Number: [id number] Patient Account Number: [id number] Date of Birth/Sex: 1955/11/25 (66 y.o. M) Treating RN: [name] Primary Care Provider: Lang [name] Other Clinician: Referring Provider: Lang [name] Treating Provider/Extender: [name] in Treatment: 8 Information Obtained from: Patient Chief Complaint Right plantar foot wound Electronic Signature(s) Signed: 01/05/2022 11:05:51 AM By: [name] Shan DO Entered By: [name] on 01/05/2022 10:34:56 [name] ([id number]) -------------------------------------------------------------------------------- HPI Details Patient Name: [name] Date of Service: 01/05/2022 9:15 AM Medical Record Number: [id number] Patient Account Number: [id number] Date of Birth/Sex: Apr 17, 1956 (66 y.o. M) Treating RN: [name] Primary Care Provider: Lang [name] Other Clinician: Referring Provider: Lang [name] Treating Provider/Extender: [name] in Treatment: 8 History of Present Illness HPI Description: Admission 11/10/2021 [name] is a 66 year old male with a past medical history of uncontrolled type 2 diabetes with last hemoglobin A2N of 8.1 complicated by peripheral neuropathy that presents to the clinic for a 2-year history of nonhealing ulcer to the bottom of his right foot. He states this started out as a blister caused by a work boot. He reports receiving wound care when he resided in [place]. He is reestablishing his wound care in [facility] today. He is currently keeping the area clean and covered. He has insoles designed to help offload the wound bed. He currently denies signs of infection. 1/18; patient presents for follow-up. He has been using Hydrofera Blue to the wound bed. He has no issues or  complaints today. He has not received the defender.[name] He denies signs of infection. 2/1; patient presents for follow-up. He has been using Hydrofera Blue to the wound bed he states he received the defender boot and has been using it however he did not have it on today. He currently denies signs of infection to the right foot. Unfortunately he developed a wound to the left great toe over the past week. He states he received new orthotics and these caused a blister to the left great toe which turned into a wound. He has not been doing anything to the wound bed. He currently denies systemic signs of infection. 2/8; Patient presents for follow-up. 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 He states he is about 90% recovered. He has some mild weakness to the left side. He reports taking the antibiotics prescribed at last clinic visit. He reports using Hydrofera Blue for dressing changes. He only uses the offloading boot when he is at home. He uses open toed shoes to the right foot. He currently denies signs of infection. 2/15; patient presents for follow-up. He states he is still taking the antibiotics prescribed. He reports using the defender boot to the right foot and open toed shoe to the left foot however he is not wearing either today. He currently denies systemic signs of infection. He stated he cut down a tree last week and was outside doing yard work. 2/22; patient presents for follow-up. 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 He reports feeling well. He reports using the defender boot to the right leg and continues to use Hydrofera Blue with dressing changes. He denies systemic signs of infection. 3/8; patient presents for follow-up. He sees Dr. Caryl [name] tomorrow for follow-up of the left  great toe amputation. This was a partial amputation. He reports  he is taking Augmentin currently. He reports increased erythema to the left great toe amputation site. He also reports a decline in his right plantar foot wound. He reports it is draining more. He denies purulent drainage. Electronic Signature(s) Signed: 01/05/2022 11:05:51 AM By: [name] Shan DO Entered By: [name] on 01/05/2022 10:37:09 [name] ([id number]) -------------------------------------------------------------------------------- Physical Exam Details Patient Name: [name] Date of Service: 01/05/2022 9:15 AM Medical Record Number: [id number] Patient Account Number: [id number] Date of Birth/Sex: 1956-10-08 (66 y.o. M) Treating RN: [name] Primary Care Provider: Lang [name] Other Clinician: Referring Provider: Lang [name] Treating Provider/Extender: [name] in Treatment: 8 Constitutional . Cardiovascular . Psychiatric . Notes Right foot: To the plantar aspect there is a punched-out ulcer with granulation tissue Minimal circumferential callus. Maceration to the periwound. No surrounding signs of infection. There is now a tunnel that is at least 2.5 cm at the 10 o'clock position. Left foot: Still open wound to the plantar aspect of the great toe. Increased erythema to this area. No purulent drainage. Electronic Signature(s) Signed: 01/05/2022 11:05:51 AM By: [name] Shan DO Entered By: [name] on 01/05/2022 10:38:17 [name] ([id number]) -------------------------------------------------------------------------------- Physician Orders Details Patient Name: [name] Date of Service: 01/05/2022 9:15 AM Medical Record Number: [id number] Patient Account Number: [id number] Date of Birth/Sex: 02/27/1956 (66 y.o. M) Treating RN: [name] Primary Care Provider: Lang [name] Other Clinician: Referring Provider: Lang [name] Treating Provider/Extender: [name] in Treatment: 8 Verbal / Phone  Orders: No Diagnosis Coding Follow-up Appointments o Return Appointment in 1 week. o Nurse Visit as needed Bathing/ Shower/ Hygiene o May shower; gently cleanse wound with antibacterial soap, rinse and pat dry prior to dressing wounds - change dressing after shower or keep dressing dry; keep covered o No tub bath. Anesthetic (Use 'Patient Medications' Section for Anesthetic Order Entry) o Lidocaine applied to wound bed Edema Control - Lymphedema / Segmental Compressive Device / Other o Elevate legs to the level of the heart and pump ankles as often as possible o Elevate leg(s) parallel to the floor when sitting. o DO YOUR BEST to sleep in the bed at night. DO NOT sleep in your recliner. Long hours of sitting in a recliner leads to swelling of the legs and/or potential wounds on your backside. Off-Loading o Foot Defender o - Right foot-MAKE SURE A SOCK OR STOCKINETTE COVERS YOUR SKIN AND NOT IN CONTACT WITH THE BOOT o Open toe surgical shoe - left Additional Orders / Instructions o Follow Nutritious Diet and Increase Protein Intake o Other: - Follow with Dr. Caryl [name] for left foot great toe post surgery, appt 3/9 Medications-Please add to medication list. o P.O. Antibiotics - Pick up and take new ABT sent to pharmacy Wound Treatment Wound #1 - Foot Wound Laterality: Plantar, Right Cleanser: Byram Ancillary Kit - 15 Day Supply (Generic) 1 x Per Day/15 Days Discharge Instructions: Use supplies as instructed; Kit contains: (15) Saline Bullets; (15) 3x3 Gauze; 15 pr Gloves Cleanser: Soap and Water 1 x Per Day/15 Days Discharge Instructions: Gently cleanse wound with antibacterial soap, rinse and pat dry prior to dressing wounds Cleanser: Wound Cleanser 1 x Per Day/15 Days Discharge Instructions: Wash your hands with soap and water. Remove old dressing, discard into plastic bag and place into trash. Cleanse the wound with Wound Cleanser prior to applying a clean  dressing using gauze sponges, not tissues or cotton balls. Do not scrub or use  excessive force. Pat dry using gauze sponges, not tissue or cotton balls. Topical: Gentamicin 1 x Per Day/15 Days Discharge Instructions: -Apply as directed by provider. Primary Dressing: Hydrofera Blue Ready Transfer Foam, 2.5x2.5 (in/in) (Generic) 1 x Per Day/15 Days Discharge Instructions: CUT TO FIT THE WOUND BED-Apply Hydrofera Blue Ready to wound bed as directed Secondary Dressing: ABD Pad 5x9 (in/in) (Generic) 1 x Per Day/15 Days Discharge Instructions: cut to fit-Cover with ABD pad/may cut in half Secured With: [place] Surgical Tape, 2x2 (in/yd) (Generic) 1 x Per Day/15 Days Discharge Instructions: tape ABD to foot to hold Secured With: Conform 4'' - Conforming Stretch Gauze Bandage 4x75 (in/in) 1 x Per Day/15 Days Discharge Instructions: Apply as directed SHAWNDELL, SCHILLACI ([id number]) Radiology o X-ray, foot - right foot Patient Medications Allergies: No Known Allergies Notifications Medication Indication Start End doxycycline hyclate 01/05/2022 DOSE 1 - oral 100 mg tablet - 1 tablet oral BID x 7 days gentamicin apply to right foot 01/05/2022 wound with dressing changes DOSE topical 0.1 % cream - cream topical Electronic Signature(s) Signed: 01/05/2022 4:28:58 PM By: [name] Signed: 01/06/2022 8:50:46 AM By: [name] Shan DO Previous Signature: 01/05/2022 10:41:55 AM Version By: [name] Shan DO Entered By: [name] on 01/05/2022 16:22:07 [name] ([id number]) -------------------------------------------------------------------------------- Prescription 01/05/2022 Patient Name: [name] Provider: Kalman Shan MD Date of Birth: 1956-04-06 NPI#: [id number] Sex: M DEA#: Phone #: [phone] License #: [id number] Patient Address: [facility] [place], [place] [address]  [address], [place], [place] [address] [phone] Allergies No Known Allergies Medication Medication: Route: Strength: Form: gentamicin topical 0.1 % cream Class: TOPICAL ANTIBIOTICS Dose: Frequency / Time: Indication: cream topical apply to right foot wound with dressing changes Number of Refills: Number of Units: 1 Fifteen (15) Gram(s) Generic Substitution: Start Date: End Date: Administered at Facility: Substitution Permitted 01/05/3290 No Note to Pharmacy: Hand Signature: Date(s): Electronic Signature(s) Signed: 01/05/2022 4:28:58 PM By: [name] Signed: 01/06/2022 8:50:46 AM By: [name] Shan DO Entered By: [name] on 01/05/2022 16:22:07 [name] ([id number]) --------------------------------------------------------------------------------  Problem List Details Patient Name: [name] Date of Service: 01/05/2022 9:15 AM Medical Record Number: [id number] Patient Account Number: [id number] Date of Birth/Sex: 07-02-1956 (66 y.o. 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 11/10/2021 No Yes exposed L97.528 Non-pressure chronic ulcer of other part of left foot with other specified 12/01/2021 No Yes severity E11.621 Type 2 diabetes mellitus with foot ulcer 11/10/2021 No Yes E11.40 Type 2 diabetes mellitus with diabetic neuropathy, unspecified 11/10/2021 No Yes R26.89 Other abnormalities of gait and mobility 11/10/2021 No Yes Inactive Problems Resolved Problems Electronic Signature(s) Signed: 01/05/2022 11:05:51 AM By: [name] Shan DO Entered By: [name] on 01/05/2022 10:34:49 [name]  ([id number]) -------------------------------------------------------------------------------- Progress Note Details Patient Name: [name] Date of Service: 01/05/2022 9:15 AM Medical Record Number: [id number] Patient Account Number: [id number] Date of Birth/Sex: 1956-05-25 (66 y.o. M) Treating RN: [name] Primary Care Provider: Lang [name] Other Clinician: Referring Provider: Lang [name] Treating Provider/Extender: [name] in Treatment: 8 Subjective Chief Complaint Information obtained from Patient Right plantar foot wound History of Present Illness (HPI) Admission 11/10/2021 [name] is a 66 year old male with  a past medical history of uncontrolled type 2 diabetes with last hemoglobin Y6A of 8.1 complicated by peripheral neuropathy that presents to the clinic for a 2-year history of nonhealing ulcer to the bottom of his right foot. He states this started out as a blister caused by a work boot. He reports receiving wound care when he resided in [place]. He is reestablishing his wound care in [facility] today. He is currently keeping the area clean and covered. He has insoles designed to help offload the wound bed. He currently denies signs of infection. 1/18; patient presents for follow-up. He has been using Hydrofera Blue to the wound bed. He has no issues or complaints today. He has not received the defender.[name] He denies signs of infection. 2/1; patient presents for follow-up. He has been using Hydrofera Blue to the wound bed he states he received the defender boot and has been using it however he did not have it on today. He currently denies signs of infection to the right foot. Unfortunately he developed a wound to the left great toe over the past week. He states he received new orthotics and these caused a blister to the left great toe which turned into a wound. He has not been doing anything to the wound bed. He currently denies  systemic signs of infection. 2/8; Patient presents for follow-up. 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 He states he is about 90% recovered. He has some mild weakness to the left side. He reports taking the antibiotics prescribed at last clinic visit. He reports using Hydrofera Blue for dressing changes. He only uses the offloading boot when he is at home. He uses open toed shoes to the right foot. He currently denies signs of infection. 2/15; patient presents for follow-up. He states he is still taking the antibiotics prescribed. He reports using the defender boot to the right foot and open toed shoe to the left foot however he is not wearing either today. He currently denies systemic signs of infection. He stated he cut down a tree last week and was outside doing yard work. 2/22; patient presents for follow-up. 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 He reports feeling well. He reports using the defender boot to the right leg and continues to use Hydrofera Blue with dressing changes. He denies systemic signs of infection. 3/8; patient presents for follow-up. He sees Dr. Caryl [name] tomorrow for follow-up of the left great toe amputation. This was a partial amputation. He reports he is taking Augmentin currently. He reports increased erythema to the left great toe amputation site. He also reports a decline in his right plantar foot wound. He reports it is draining more. He denies purulent drainage. Objective Constitutional Vitals Time Taken: 9:34 AM, Height: 74 in, Weight: 226 lbs, BMI: 29, Temperature: 98.3 F, Pulse: 91 bpm, Respiratory Rate: 16 breaths/min, Blood Pressure: 114/77 mmHg. General Notes: Right foot: To the plantar aspect there is a punched-out ulcer with granulation tissue Minimal circumferential callus. Maceration to the periwound. No  surrounding signs of infection. There is now a tunnel that is at least 2.5 cm at the 10 o'clock position. Left foot: Still open wound to the plantar aspect of the great toe. Increased erythema to this area. No purulent drainage. Integumentary (Hair, Skin) Wound #1 status is Open. Original cause of wound was Blister.  The date acquired was: 07/15/2020. The wound has been in treatment 8 weeks. The wound is located on the [place]. The wound measures 1.1cm length x 0.8cm width x 0.5cm depth; 0.691cm^2 area and 0.346cm^3 volume. There is Fat Layer (Subcutaneous Tissue) exposed. Tunneling has been noted at 11:00 with a maximum distance of 2.5cm. Undermining begins at 10:00 and ends at 12:00 with a maximum distance of 0.9cm. There is a medium amount of serosanguineous drainage noted. The wound margin is thickened. There is large (67-100%) red, pink, hyper - granulation within the wound bed. There is a small (1-33%) Mattioli, Acelin ([id number]) amount of necrotic tissue within the wound bed including Adherent Slough. 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 There is now tunneling. He is currently taking Augmentin Prescribed by his podiatrist. I will add doxycycline. Also recommended gentamicin ointment with dressing changes to address any bioburden. Also recommended aggressive offloading with the defender boot. He again is not wearing this today. Also recommended a right foot x-ray. He sees Dr. Caryl [name] tomorrow for evaluation of his left great toe. It will likely need to be completely amputated. Plan Follow-up Appointments: Return Appointment in 1  week. Nurse Visit as needed Bathing/ Shower/ Hygiene: May shower; gently cleanse wound with antibacterial soap, rinse and pat dry prior to dressing wounds - change dressing after shower or keep dressing dry; keep covered No tub bath. Anesthetic (Use 'Patient Medications' Section for Anesthetic Order Entry): Lidocaine applied to wound bed Edema Control - Lymphedema / Segmental Compressive Device / Other: Elevate legs to the level of the heart and pump ankles as often as possible Elevate leg(s) parallel to the floor when sitting. DO YOUR BEST to sleep in the bed at night. DO NOT sleep in your recliner. Long hours of sitting in a recliner leads to swelling of the legs and/or potential wounds on your backside. Off-Loading: Foot Defender  - Right foot-MAKE SURE A SOCK OR STOCKINETTE COVERS YOUR SKIN AND NOT IN CONTACT WITH THE BOOT Open toe surgical shoe - left Additional Orders / Instructions: Follow Nutritious Diet and Increase Protein Intake Other: - Follow with Dr. Caryl [name] for left foot great toe post surgery, appt 3/9 Medications-Please add to medication list.: P.O. Antibiotics - Pick up and take new ABT sent to pharmacy Radiology ordered were: X-ray, foot - right foot WOUND #1: - Foot Wound Laterality: Plantar, Right Cleanser: Byram Ancillary Kit - 15 Day Supply (Generic) 1 x Per Day/15 Days Discharge Instructions: Use supplies as instructed; Kit contains: (15) Saline Bullets; (15) 3x3 Gauze; 15 pr Gloves Cleanser: Soap and Water 1 x Per Day/15 Days Discharge Instructions: Gently cleanse wound with antibacterial soap, rinse and pat dry prior to dressing wounds Cleanser: Wound Cleanser 1 x Per Day/15 Days Discharge Instructions: Wash your hands with soap and water. Remove old dressing, discard into plastic bag and place into trash. Cleanse the wound with Wound Cleanser prior to applying a clean dressing using gauze sponges, not tissues or cotton balls. Do not scrub or use excessive  force. Pat dry using gauze sponges, not tissue or cotton balls. Topical: Gentamicin 1 x Per Day/15 Days Discharge Instructions: -Apply as directed by provider. Primary Dressing: Hydrofera Blue Ready Transfer  Foam, 2.5x2.5 (in/in) (Generic) 1 x Per Day/15 Days Discharge Instructions: CUT TO FIT THE WOUND BED-Apply Hydrofera Blue Ready to wound bed as directed Secondary Dressing: ABD Pad 5x9 (in/in) (Generic) 1 x Per Day/15 Days Discharge Instructions: cut to fit-Cover with ABD pad/may cut in half Secured With: [place] Surgical Tape, 2x2 (in/yd) (Generic) 1 x Per Day/15 Days Discharge Instructions: tape ABD to foot to hold Secured With: Conform 4'' - Conforming Stretch Gauze Bandage 4x75 (in/in) 1 x Per Day/15 Days Discharge Instructions: Apply as directed [place], [name] ([id number]) 1. Gentamicin ointment and Hydrofera Blue with aggressive offloading defender boot 2. Right foot x-ray 3. Doxycycline Electronic Signature(s) Signed: 01/05/2022 11:05:51 AM By: [name] Shan DO Entered By: [name] on 01/05/2022 10:40:21 [name] ([id number]) -------------------------------------------------------------------------------- SuperBill Details Patient Name: [name] Date of Service: 01/05/2022 Medical Record Number: [id number] Patient Account Number: [id number] Date of Birth/Sex: May 25, 1956 (66 y.o. M) Treating RN: [name] Primary Care Provider: Lang [name] Other Clinician: Referring Provider: Lang [name] Treating Provider/Extender: [name] in Treatment: 8 Diagnosis Coding ICD-10 Codes Code Description [phone] 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ed L97.528 Non-pressure chronic ulcer of other part of left foot with other specified severity E11.621 Type 2 diabetes mellitus with foot ulcer E11.40 Type 2 diabetes mellitus with diabetic neuropathy, unspecified R26.89 Other  abnormalities of gait and mobility Facility Procedures CPT4 Code: [id number] Description: [id number] - WOUND CARE VISIT-LEV 2 EST PT Modifier: Quantity: 1 Physician Procedures CPT4 Code: 8341962 Description: 99214 - WC PHYS LEVEL 4 - EST PT Modifier: Quantity: 1 CPT4 Code: Description: ICD-10 Diagnosis Description L97.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

## 2022-01-05 NOTE — Progress Notes (Signed)
NAJAE, RATHERT ([id number]) Visit Report for 01/05/2022 Arrival Information Details Patient Name: [name], [name] Date of Service: 01/05/2022 9:15 AM Medical Record Number: [id number] Patient Account Number: [id number] Date of Birth/Sex: [birth year] (66 y.o. M) Treating RN: Donnamarie Poag Primary Care Hussien Greenblatt: [name] Other Clinician: Referring Gerard Cantara: [name] Treating Avelynn Sellin/Extender: Yaakov Guthrie in Treatment: 8 Visit Information History Since Last Visit Added or deleted any medications: No Patient Arrived: Ambulatory Had a fall or experienced change in No Arrival Time: 09:25 activities of daily living that may affect Accompanied By: self risk of falls: Transfer Assistance: None Hospitalized since last visit: No Patient Identification Verified: Yes Has Dressing in Place as Prescribed: Yes Secondary Verification Process Completed: Yes Pain Present Now: No Patient Requires Transmission-Based No Precautions: Patient Has Alerts: Yes Patient Alerts: Patient on Blood Thinner DIABETIC Plavix Electronic Signature(s) Signed: 01/05/2022 12:55:43 PM By: Donnamarie Poag Entered By: Donnamarie Poag on 01/05/2022 09:34:17 [name] ([id number]) -------------------------------------------------------------------------------- Clinic Level of Care Assessment Details Patient Name: [name] Date of Service: 01/05/2022 9:15 AM Medical Record Number: [id number] Patient Account Number: [id number] Date of Birth/Sex: [birth year] (66 y.o. M) Treating RN: Donnamarie Poag Primary Care Donaldo Teegarden: [name] Other Clinician: Referring Tayjon Halladay: [name] Treating Teyonna Plaisted/Extender: Yaakov Guthrie in Treatment: 8 Clinic Level of Care Assessment Items TOOL 4 Quantity Score []  - Use when only an EandM is performed on FOLLOW-UP visit 0 ASSESSMENTS - Nursing Assessment / Reassessment []  - Reassessment of Co-morbidities (includes updates in patient status) 0 []  -  0 Reassessment of Adherence to Treatment Plan ASSESSMENTS - Wound and Skin Assessment / Reassessment X - Simple Wound Assessment / Reassessment - one wound 1 5 []  - 0 Complex Wound Assessment / Reassessment - multiple wounds []  - 0 Dermatologic / Skin Assessment (not related to wound area) ASSESSMENTS - Focused Assessment []  - Circumferential Edema Measurements - multi extremities 0 []  - 0 Nutritional Assessment / Counseling / Intervention []  - 0 Lower Extremity Assessment (monofilament, tuning fork, pulses) []  - 0 Peripheral Arterial Disease Assessment (using hand held doppler) ASSESSMENTS - Ostomy and/or Continence Assessment and Care []  - Incontinence Assessment and Management 0 []  - 0 Ostomy Care Assessment and Management (repouching, etc.) PROCESS - Coordination of Care X - Simple Patient / Family Education for ongoing care 1 15 []  - 0 Complex (extensive) Patient / Family Education for ongoing care X- 1 10 Staff obtains Consents, Records, Test Results / Process Orders []  - 0 Staff telephones HHA, Nursing Homes / Clarify orders / etc []  - 0 Routine Transfer to another Facility (non-emergent condition) []  - 0 Routine Hospital Admission (non-emergent condition) []  - 0 New Admissions / Biomedical engineer / Ordering NPWT, Apligraf, etc. []  - 0 Emergency Hospital Admission (emergent condition) X- 1 10 Simple Discharge Coordination []  - 0 Complex (extensive) Discharge Coordination PROCESS - Special Needs []  - Pediatric / Minor Patient Management 0 []  - 0 Isolation Patient Management []  - 0 Hearing / Language / Visual special needs []  - 0 Assessment of Community assistance (transportation, D/C planning, etc.) []  - 0 Additional assistance / Altered mentation []  - 0 Support Surface(s) Assessment (bed, cushion, seat, etc.) INTERVENTIONS - Wound Cleansing / Measurement Hornbaker, Hattie ([id number]) X- 1 5 Simple Wound Cleansing - one wound []  - 0 Complex Wound  Cleansing - multiple wounds X- 1 5 Wound Imaging (photographs - any number of wounds) []  - 0 Wound Tracing (instead of photographs) X- 1 5 Simple Wound Measurement - one wound []  - 0 Complex  Wound Measurement - multiple wounds INTERVENTIONS - Wound Dressings X - Small Wound Dressing one or multiple wounds 1 10 []  - 0 Medium Wound Dressing one or multiple wounds []  - 0 Large Wound Dressing one or multiple wounds []  - 0 Application of Medications - topical []  - 0 Application of Medications - injection INTERVENTIONS - Miscellaneous []  - External ear exam 0 []  - 0 Specimen Collection (cultures, biopsies, blood, body fluids, etc.) []  - 0 Specimen(s) / Culture(s) sent or taken to Lab for analysis []  - 0 Patient Transfer (multiple staff / Civil Service fast streamer / Similar devices) []  - 0 Simple Staple / Suture removal (25 or less) []  - 0 Complex Staple / Suture removal (26 or more) []  - 0 Hypo / Hyperglycemic Management (close monitor of Blood Glucose) []  - 0 Ankle / Brachial Index (ABI) - do not check if billed separately X- 1 5 Vital Signs Has the patient been seen at the hospital within the last three years: Yes Total Score: 70 Level Of Care: New/Established - Level 2 Electronic Signature(s) Signed: 01/05/2022 12:55:43 PM By: Donnamarie Poag Entered By: Donnamarie Poag on 01/05/2022 10:42:46 [name] ([id number]) -------------------------------------------------------------------------------- Encounter Discharge Information Details Patient Name: [name] Date of Service: 01/05/2022 9:15 AM Medical Record Number: [id number] Patient Account Number: [id number] Date of Birth/Sex: [birth year] (66 y.o. M) Treating RN: Donnamarie Poag Primary Care Henrietta Cieslewicz: [name] Other Clinician: Referring Beau Ramsburg: [name] Treating Nariyah Osias/Extender: Yaakov Guthrie in Treatment: 8 Encounter Discharge Information Items Discharge Condition: Stable Ambulatory Status:  Ambulatory Discharge Destination: Home Transportation: Private Auto Accompanied By: self Schedule Follow-up Appointment: Yes Clinical Summary of Care: Electronic Signature(s) Signed: 01/05/2022 12:55:43 PM By: Donnamarie Poag Entered By: Donnamarie Poag on 01/05/2022 10:44:31 [name] ([id number]) -------------------------------------------------------------------------------- Lower Extremity Assessment Details Patient Name: [name] Date of Service: 01/05/2022 9:15 AM Medical Record Number: [id number] Patient Account Number: [id number] Date of Birth/Sex: 04-Nov-1955 (66 y.o. M) Treating RN: Donnamarie Poag Primary Care Brennley Curtice: [name] Other Clinician: Referring Tylek Boney: [name] Treating Tavaria Mackins/Extender: Yaakov Guthrie in Treatment: 8 Edema Assessment Assessed: [Left: Yes] [Right: Yes] Edema: [Left: No] [Right: No] Vascular Assessment Pulses: Dorsalis Pedis Palpable: [Left:Yes] [Right:Yes] Electronic Signature(s) Signed: 01/05/2022 12:55:43 PM By: Donnamarie Poag Entered By: Donnamarie Poag on 01/05/2022 09:42:20 [name] ([id number]) -------------------------------------------------------------------------------- Multi Wound Chart Details Patient Name: [name] Date of Service: 01/05/2022 9:15 AM Medical Record Number: [id number] Patient Account Number: [id number] Date of Birth/Sex: November 01, 1955 (66 y.o. M) Treating RN: Donnamarie Poag Primary Care Roniyah Llorens: [name] Other Clinician: Referring Shady Bradish: [name] Treating Dvon Jiles/Extender: Yaakov Guthrie in Treatment: 8 Vital Signs Height(in): 74 Pulse(bpm): 91 Weight(lbs): 226 Blood Pressure(mmHg): 114/77 Body Mass Index(BMI): 29 Temperature(F): 98.3 Respiratory Rate(breaths/min): 16 Photos: [N/A:N/A] Wound Location: Right, Plantar Foot N/A N/A Wounding Event: Blister N/A N/A Primary Etiology: Diabetic Wound/Ulcer of the Lower N/A N/A Extremity Comorbid History: Type II  Diabetes, Osteoarthritis N/A N/A Date Acquired: 07/15/2020 N/A N/A Weeks of Treatment: 8 N/A N/A Wound Status: Open N/A N/A Wound Recurrence: No N/A N/A Measurements L x W x D (cm) 1.1x0.8x0.5 N/A N/A Area (cm) : 0.691 N/A N/A Volume (cm) : 0.346 N/A N/A % Reduction in Area: 38.50% N/A N/A % Reduction in Volume: 48.70% N/A N/A Position 1 (o'clock): 11 Maximum Distance 1 (cm): 1.4 Starting Position 1 (o'clock): 10 Ending Position 1 (o'clock): 12 Maximum Distance 1 (cm): 0.9 Tunneling: Yes N/A N/A Undermining: Yes N/A N/A Classification: Grade 2 N/A N/A Exudate Amount: Medium N/A N/A Exudate Type: Serosanguineous N/A  N/A Exudate Color: red, brown N/A N/A Wound Margin: Thickened N/A N/A Granulation Amount: Large (67-100%) N/A N/A Granulation Quality: Red, Pink, Hyper-granulation N/A N/A Necrotic Amount: Small (1-33%) N/A N/A Exposed Structures: Fat Layer (Subcutaneous Tissue): N/A N/A Yes Fascia: No Tendon: No Muscle: No Joint: No Bone: No Epithelialization: None N/A N/A Treatment Notes ARA, MANO ([id number]) Electronic Signature(s) Signed: 01/05/2022 12:55:43 PM By: Donnamarie Poag Entered By: Donnamarie Poag on 01/05/2022 10:25:15 [name] ([id number]) -------------------------------------------------------------------------------- [place] Details Patient Name: [name] Date of Service: 01/05/2022 9:15 AM Medical Record Number: [id number] Patient Account Number: [id number] Date of Birth/Sex: Sep 19, 1956 (66 y.o. M) Treating RN: Donnamarie Poag Primary Care Fulton Merry: [name] Other Clinician: Referring Laurier Jasperson: [name] Treating Jewels Langone/Extender: Yaakov Guthrie in Treatment: 8 Active Inactive Wound/Skin Impairment Nursing Diagnoses: Impaired tissue integrity Knowledge deficit related to smoking impact on wound healing Knowledge deficit related to ulceration/compromised skin integrity Goals: Patient/caregiver will  verbalize understanding of skin care regimen Date Initiated: 11/10/2021 Date Inactivated: 12/01/2021 Target Resolution Date: 11/17/2021 Goal Status: Met Ulcer/skin breakdown will have a volume reduction of 30% by week 4 Date Initiated: 11/10/2021 Target Resolution Date: 12/08/2021 Goal Status: Active Ulcer/skin breakdown will have a volume reduction of 50% by week 8 Date Initiated: 11/10/2021 Target Resolution Date: 01/05/2022 Goal Status: Active Ulcer/skin breakdown will have a volume reduction of 80% by week 12 Date Initiated: 11/10/2021 Target Resolution Date: 02/02/2022 Goal Status: Active Ulcer/skin breakdown will heal within 14 weeks Date Initiated: 11/10/2021 Target Resolution Date: 02/16/2022 Goal Status: Active Interventions: Assess patient/caregiver ability to obtain necessary supplies Assess patient/caregiver ability to perform ulcer/skin care regimen upon admission and as needed Assess ulceration(s) every visit Notes: Electronic Signature(s) Signed: 01/05/2022 12:55:43 PM By: Donnamarie Poag Entered By: Donnamarie Poag on 01/05/2022 10:24:13 [name] ([id number]) -------------------------------------------------------------------------------- Pain Assessment Details Patient Name: [name] Date of Service: 01/05/2022 9:15 AM Medical Record Number: [id number] Patient Account Number: [id number] Date of Birth/Sex: 1956-02-02 (66 y.o. M) Treating RN: Donnamarie Poag Primary Care Classie Weng: [name] Other Clinician: Referring Demetris Capell: [name] Treating Jariel Drost/Extender: Yaakov Guthrie in Treatment: 8 Active Problems Location of Pain Severity and Description of Pain Patient Has Paino No Site Locations Rate the pain. Current Pain Level: 0 Pain Management and Medication Current Pain Management: Electronic Signature(s) Signed: 01/05/2022 12:55:43 PM By: Donnamarie Poag Entered By: Donnamarie Poag on 01/05/2022 09:35:21 [name]  ([id number]) -------------------------------------------------------------------------------- Patient/Caregiver Education Details Patient Name: [name] Date of Service: 01/05/2022 9:15 AM Medical Record Number: [id number] Patient Account Number: [id number] Date of Birth/Gender: 1956-06-13 (66 y.o. M) Treating RN: Donnamarie Poag Primary Care Physician: [name] Other Clinician: Referring Physician: [name] Treating Physician/Extender: Yaakov Guthrie in Treatment: 8 Education Assessment Education Provided To: Patient Education Topics Provided Basic Hygiene: Elevated Blood Sugar/ Impact on Healing: Nutrition: Offloading: Pressure: Wound/Skin Impairment: Electronic Signature(s) Signed: 01/05/2022 12:55:43 PM By: Donnamarie Poag Entered By: Donnamarie Poag on 01/05/2022 10:43:42 [name] ([id number]) -------------------------------------------------------------------------------- Wound Assessment Details Patient Name: [name] Date of Service: 01/05/2022 9:15 AM Medical Record Number: [id number] Patient Account Number: [id number] Date of Birth/Sex: 08/23/1956 (66 y.o. M) Treating RN: Donnamarie Poag Primary Care Michaeleen Down: [name] Other Clinician: Referring Valentine Kuechle: [name] Treating Arnecia Ector/Extender: Yaakov Guthrie in Treatment: 8 Wound Status Wound Number: 1 Primary Etiology: Diabetic Wound/Ulcer of the Lower Extremity Wound Location: Right, Plantar Foot Wound Status: Open Wounding Event: Blister Comorbid History: Type II Diabetes, Osteoarthritis Date Acquired: 07/15/2020 Weeks Of Treatment: 8 Clustered Wound: No Photos Wound Measurements Length: (cm) 1.1 %  Re Width: (cm) 0.8 % Re Depth: (cm) 0.5 Epit Area: (cm) 0.691 Tun Volume: (cm) 0.346 M duction in Area: 38.5% duction in Volume: 48.7% helialization: None neling: Yes Position (o'clock): 11 aximum Distance: (cm) 2.5 Undermining: Yes Starting Position (o'clock):  10 Ending Position (o'clock): 12 Maximum Distance: (cm) 0.9 Wound Description Classification: Grade 2 Foul Wound Margin: Thickened Slou Exudate Amount: Medium Exudate Type: Serosanguineous Exudate Color: red, brown Odor After Cleansing: No gh/Fibrino Yes Wound Bed Granulation Amount: Large (67-100%) Exposed Structure Granulation Quality: Red, Pink, Hyper-granulation Fascia Exposed: No Necrotic Amount: Small (1-33%) Fat Layer (Subcutaneous Tissue) Exposed: Yes Necrotic Quality: Adherent Slough Tendon Exposed: No Muscle Exposed: No Joint Exposed: No Bone Exposed: No Treatment Notes Frutiger, Tyjon ([id number]) Wound #1 (Foot) Wound Laterality: Plantar, Right Cleanser [place] Ancillary Kit - 15 Day Supply Discharge Instruction: Use supplies as instructed; Kit contains: (15) Saline Bullets; (15) 3x3 Gauze; 15 pr Gloves Soap and Water Discharge Instruction: Gently cleanse wound with antibacterial soap, rinse and pat dry prior to dressing wounds Wound Cleanser Discharge Instruction: Wash your hands with soap and water. Remove old dressing, discard into plastic bag and place into trash. Cleanse the wound with Wound Cleanser prior to applying a clean dressing using gauze sponges, not tissues or cotton balls. Do not scrub or use excessive force. Pat dry using gauze sponges, not tissue or cotton balls. Peri-Wound Care Topical Gentamicin Discharge Instruction: -Apply as directed by Cael Worth. Primary Dressing Hydrofera Blue Ready Transfer Foam, 2.5x2.5 (in/in) Discharge Instruction: CUT TO FIT THE WOUND BED-Apply Hydrofera Blue Ready to wound bed as directed Secondary Dressing ABD Pad 5x9 (in/in) Discharge Instruction: cut to fit-Cover with ABD pad/may cut in half Secured With [place] Surgical Tape, 2x2 (in/yd) Discharge Instruction: tape ABD to foot to hold Conform 4'' - Conforming Stretch Gauze Bandage 4x75 (in/in) Discharge Instruction: Apply as  directed Compression Wrap Compression Stockings Add-Ons Electronic Signature(s) Signed: 01/05/2022 12:55:43 PM By: Donnamarie Poag Entered By: Donnamarie Poag on 01/05/2022 10:26:09 [name] ([id number]) -------------------------------------------------------------------------------- [place] Details Patient Name: [name] Date of Service: 01/05/2022 9:15 AM Medical Record Number: [id number] Patient Account Number: [id number] Date of Birth/Sex: 15-Jul-1956 (66 y.o. M) Treating RN: Donnamarie Poag Primary Care Madix Blowe: [name] Other Clinician: Referring Cheyrl Buley: [name] Treating Prisilla Kocsis/Extender: Yaakov Guthrie in Treatment: 8 Vital Signs Time Taken: 09:34 Temperature (F): 98.3 Height (in): 74 Pulse (bpm): 91 Weight (lbs): 226 Respiratory Rate (breaths/min): 16 Body Mass Index (BMI): 29 Blood Pressure (mmHg): 114/77 Reference Range: 80 - 120 mg / dl Electronic Signature(s) Signed: 01/05/2022 12:55:43 PM By: Donnamarie Poag Entered ByDonnamarie Poag on 01/05/2022 09:35:12

## 2022-01-12 ENCOUNTER — Other Ambulatory Visit: Payer: Self-pay

## 2022-01-12 ENCOUNTER — Encounter (HOSPITAL_BASED_OUTPATIENT_CLINIC_OR_DEPARTMENT_OTHER): Payer: Medicare Other | Admitting: Internal Medicine

## 2022-01-12 ENCOUNTER — Inpatient Hospital Stay
Admission: EM | Admit: 2022-01-12 | Discharge: 2022-01-16 | DRG: 504 | Disposition: A | Discharge status: Home / Self Care | Payer: Medicare Other | Source: Ambulatory Visit | Attending: Internal Medicine | Admitting: Internal Medicine

## 2022-01-12 DIAGNOSIS — Z66 Do not resuscitate: Secondary | ICD-10-CM | POA: Diagnosis present

## 2022-01-12 DIAGNOSIS — L03032 Cellulitis of left toe: Secondary | ICD-10-CM | POA: Diagnosis present

## 2022-01-12 DIAGNOSIS — F32A Depression, unspecified: Secondary | ICD-10-CM

## 2022-01-12 DIAGNOSIS — L97526 Non-pressure chronic ulcer of other part of left foot with bone involvement without evidence of necrosis: Secondary | ICD-10-CM | POA: Diagnosis present

## 2022-01-12 DIAGNOSIS — E1122 Type 2 diabetes mellitus with diabetic chronic kidney disease: Secondary | ICD-10-CM | POA: Diagnosis present

## 2022-01-12 DIAGNOSIS — I69354 Hemiplegia and hemiparesis following cerebral infarction affecting left non-dominant side: Secondary | ICD-10-CM

## 2022-01-12 DIAGNOSIS — N1831 Chronic kidney disease, stage 3a: Secondary | ICD-10-CM

## 2022-01-12 DIAGNOSIS — M869 Osteomyelitis, unspecified: Secondary | ICD-10-CM | POA: Diagnosis present

## 2022-01-12 DIAGNOSIS — E1169 Type 2 diabetes mellitus with other specified complication: Secondary | ICD-10-CM | POA: Diagnosis present

## 2022-01-12 DIAGNOSIS — E11621 Type 2 diabetes mellitus with foot ulcer: Secondary | ICD-10-CM | POA: Diagnosis present

## 2022-01-12 DIAGNOSIS — Z7982 Long term (current) use of aspirin: Secondary | ICD-10-CM | POA: Diagnosis not present

## 2022-01-12 DIAGNOSIS — E119 Type 2 diabetes mellitus without complications: Secondary | ICD-10-CM

## 2022-01-12 DIAGNOSIS — R2689 Other abnormalities of gait and mobility: Secondary | ICD-10-CM

## 2022-01-12 DIAGNOSIS — L97512 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ed: Secondary | ICD-10-CM

## 2022-01-12 DIAGNOSIS — I129 Hypertensive chronic kidney disease with stage 1 through stage 4 chronic kidney disease, or unspecified chronic kidney disease: Secondary | ICD-10-CM | POA: Diagnosis present

## 2022-01-12 DIAGNOSIS — M96830 Postprocedural hemorrhage and hematoma of a musculoskeletal structure following a musculoskeletal system procedure: Secondary | ICD-10-CM | POA: Diagnosis not present

## 2022-01-12 DIAGNOSIS — E1140 Type 2 diabetes mellitus with diabetic neuropathy, unspecified: Secondary | ICD-10-CM

## 2022-01-12 DIAGNOSIS — Z89422 Acquired absence of other left toe(s): Secondary | ICD-10-CM | POA: Diagnosis not present

## 2022-01-12 DIAGNOSIS — Z79899 Other long term (current) drug therapy: Secondary | ICD-10-CM

## 2022-01-12 DIAGNOSIS — M109 Gout, unspecified: Secondary | ICD-10-CM | POA: Diagnosis present

## 2022-01-12 DIAGNOSIS — T8744 Infection of amputation stump, left lower extremity: Secondary | ICD-10-CM | POA: Diagnosis present

## 2022-01-12 DIAGNOSIS — N179 Acute kidney failure, unspecified: Secondary | ICD-10-CM | POA: Diagnosis not present

## 2022-01-12 DIAGNOSIS — E872 Acidosis, unspecified: Secondary | ICD-10-CM | POA: Diagnosis present

## 2022-01-12 DIAGNOSIS — L0390 Cellulitis, unspecified: Secondary | ICD-10-CM | POA: Diagnosis not present

## 2022-01-12 LAB — CBC WITH DIFFERENTIAL/PLATELET
Abs Immature Granulocytes: 0.02 10*3/uL (ref 0.00–0.07)
Basophils Absolute: 0.1 10*3/uL (ref 0.0–0.1)
Basophils Relative: 1 %
Eosinophils Absolute: 0.4 10*3/uL (ref 0.0–0.5)
Eosinophils Relative: 5 %
HCT: 43.5 % (ref 39.0–52.0)
Hemoglobin: 13.8 g/dL (ref 13.0–17.0)
Immature Granulocytes: 0 %
Lymphocytes Relative: 26 %
Lymphs Abs: 2.2 10*3/uL (ref 0.7–4.0)
MCH: 28.7 pg (ref 26.0–34.0)
MCHC: 31.7 g/dL (ref 30.0–36.0)
MCV: 90.4 fL (ref 80.0–100.0)
Monocytes Absolute: 0.7 10*3/uL (ref 0.1–1.0)
Monocytes Relative: 8 %
Neutro Abs: 5 10*3/uL (ref 1.7–7.7)
Neutrophils Relative %: 60 %
Platelets: 307 10*3/uL (ref 150–400)
RBC: 4.81 MIL/uL (ref 4.22–5.81)
RDW: 14.7 % (ref 11.5–15.5)
WBC: 8.4 10*3/uL (ref 4.0–10.5)
nRBC: 0 % (ref 0.0–0.2)

## 2022-01-12 LAB — COMPREHENSIVE METABOLIC PANEL
ALT: 15 U/L (ref 0–44)
AST: 22 U/L (ref 15–41)
Albumin: 3.7 g/dL (ref 3.5–5.0)
Alkaline Phosphatase: 79 U/L (ref 38–126)
Anion gap: 10 (ref 5–15)
BUN: 22 mg/dL (ref 8–23)
CO2: 27 mmol/L (ref 22–32)
Calcium: 9.5 mg/dL (ref 8.9–10.3)
Chloride: 98 mmol/L (ref 98–111)
Creatinine, Ser: 1.5 mg/dL — ABNORMAL HIGH (ref 0.61–1.24)
GFR, Estimated: 51 mL/min — ABNORMAL LOW (ref >60)
Glucose, Bld: 115 mg/dL — ABNORMAL HIGH (ref 70–99)
Potassium: 4.1 mmol/L (ref 3.5–5.1)
Sodium: 135 mmol/L (ref 135–145)
Total Bilirubin: 0.7 mg/dL (ref 0.3–1.2)
Total Protein: 8.2 g/dL — ABNORMAL HIGH (ref 6.5–8.1)

## 2022-01-12 LAB — GLUCOSE, CAPILLARY: Glucose-Capillary: 122 mg/dL — ABNORMAL HIGH (ref 70–99)

## 2022-01-12 LAB — LACTIC ACID, PLASMA
Lactic Acid, Venous: 2.1 mmol/L (ref 0.5–1.9)
Lactic Acid, Venous: 2.1 mmol/L (ref 0.5–1.9)

## 2022-01-12 MED ORDER — GABAPENTIN 300 MG PO CAPS
900.0000 mg | ORAL_CAPSULE | Freq: Once | ORAL | Status: AC
  Administered 2022-01-12: 900 mg via ORAL
  Filled 2022-01-12: qty 3

## 2022-01-12 MED ORDER — INSULIN ASPART 100 UNIT/ML IJ SOLN
0.0000 [IU] | INTRAMUSCULAR | Status: DC
  Administered 2022-01-12: 2 [IU] via SUBCUTANEOUS
  Administered 2022-01-13: 5 [IU] via SUBCUTANEOUS
  Administered 2022-01-13: 2 [IU] via SUBCUTANEOUS
  Administered 2022-01-13: 3 [IU] via SUBCUTANEOUS
  Administered 2022-01-13: 2 [IU] via SUBCUTANEOUS
  Administered 2022-01-14 (×3): 3 [IU] via SUBCUTANEOUS
  Administered 2022-01-14 – 2022-01-16 (×8): 2 [IU] via SUBCUTANEOUS
  Administered 2022-01-16: 3 [IU] via SUBCUTANEOUS
  Filled 2022-01-12 (×17): qty 1

## 2022-01-12 MED ORDER — MORPHINE SULFATE (PF) 2 MG/ML IV SOLN
2.0000 mg | INTRAVENOUS | Status: DC | PRN
  Administered 2022-01-14: 2 mg via INTRAVENOUS
  Filled 2022-01-12: qty 1

## 2022-01-12 MED ORDER — LISINOPRIL 5 MG PO TABS
5.0000 mg | ORAL_TABLET | Freq: Every day | ORAL | Status: DC
  Administered 2022-01-13: 5 mg via ORAL
  Filled 2022-01-12: qty 1

## 2022-01-12 MED ORDER — ONDANSETRON HCL 4 MG/2ML IJ SOLN
4.0000 mg | Freq: Four times a day (QID) | INTRAMUSCULAR | Status: DC | PRN
Start: 2022-01-12 — End: 2022-01-16

## 2022-01-12 MED ORDER — ASPIRIN 81 MG PO CHEW
81.0000 mg | CHEWABLE_TABLET | Freq: Every day | ORAL | Status: DC
  Administered 2022-01-15 – 2022-01-16 (×2): 81 mg via ORAL
  Filled 2022-01-12 (×4): qty 1

## 2022-01-12 MED ORDER — QUETIAPINE FUMARATE 25 MG PO TABS
25.0000 mg | ORAL_TABLET | Freq: Every day | ORAL | Status: DC
  Administered 2022-01-12 – 2022-01-15 (×4): 25 mg via ORAL
  Filled 2022-01-12 (×5): qty 1

## 2022-01-12 MED ORDER — ALLOPURINOL 300 MG PO TABS
300.0000 mg | ORAL_TABLET | Freq: Every day | ORAL | Status: DC
  Administered 2022-01-12 – 2022-01-16 (×5): 300 mg via ORAL
  Filled 2022-01-12 (×5): qty 1

## 2022-01-12 MED ORDER — SODIUM CHLORIDE 0.9 % IV SOLN
1.0000 g | INTRAVENOUS | Status: DC
  Administered 2022-01-12 – 2022-01-13 (×2): 1 g via INTRAVENOUS
  Filled 2022-01-12 (×3): qty 10

## 2022-01-12 MED ORDER — HYDROCODONE-ACETAMINOPHEN 5-325 MG PO TABS
1.0000 | ORAL_TABLET | ORAL | Status: DC | PRN
  Administered 2022-01-12 – 2022-01-14 (×4): 1 via ORAL
  Administered 2022-01-15 – 2022-01-16 (×5): 2 via ORAL
  Filled 2022-01-12: qty 1
  Filled 2022-01-12: qty 2
  Filled 2022-01-12 (×3): qty 1
  Filled 2022-01-12 (×4): qty 2

## 2022-01-12 MED ORDER — VANCOMYCIN HCL 1750 MG/350ML IV SOLN
1750.0000 mg | INTRAVENOUS | Status: DC
  Administered 2022-01-13 – 2022-01-14 (×2): 1750 mg via INTRAVENOUS
  Filled 2022-01-12 (×2): qty 350

## 2022-01-12 MED ORDER — ATORVASTATIN CALCIUM 20 MG PO TABS
80.0000 mg | ORAL_TABLET | Freq: Every day | ORAL | Status: DC
  Administered 2022-01-13 – 2022-01-16 (×4): 80 mg via ORAL
  Filled 2022-01-12 (×4): qty 4

## 2022-01-12 MED ORDER — SODIUM CHLORIDE 0.9 % IV SOLN: INTRAVENOUS | Status: AC

## 2022-01-12 MED ORDER — VANCOMYCIN HCL IN DEXTROSE 1-5 GM/200ML-% IV SOLN
1000.0000 mg | Freq: Once | INTRAVENOUS | Status: AC
  Administered 2022-01-12: 1000 mg via INTRAVENOUS
  Filled 2022-01-12: qty 200

## 2022-01-12 MED ORDER — ONDANSETRON HCL 4 MG PO TABS: 4.0000 mg | ORAL_TABLET | Freq: Four times a day (QID) | ORAL | Status: DC | PRN

## 2022-01-12 MED ORDER — VANCOMYCIN HCL 1500 MG/300ML IV SOLN
1500.0000 mg | Freq: Once | INTRAVENOUS | Status: AC
  Administered 2022-01-13: 1500 mg via INTRAVENOUS
  Filled 2022-01-12: qty 300

## 2022-01-12 MED ORDER — GABAPENTIN 300 MG PO CAPS
900.0000 mg | ORAL_CAPSULE | Freq: Three times a day (TID) | ORAL | Status: DC
  Administered 2022-01-13 – 2022-01-16 (×10): 900 mg via ORAL
  Filled 2022-01-12 (×10): qty 3

## 2022-01-12 MED ORDER — ACETAMINOPHEN 325 MG PO TABS: 650.0000 mg | ORAL_TABLET | Freq: Four times a day (QID) | ORAL | Status: DC | PRN

## 2022-01-12 MED ORDER — ACETAMINOPHEN 650 MG RE SUPP
650.0000 mg | Freq: Four times a day (QID) | RECTAL | Status: DC | PRN
  Filled 2022-01-12: qty 2

## 2022-01-12 NOTE — Assessment & Plan Note (Addendum)
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  Followed by podiatry.  Concern for osteomyelitis.   ?Patient was started on vancomycin ceftriaxone.   ?Underwent amputation of the hallux on 3/17.  Had some bleeding issues.  Hemoglobin remained stable.  Monitored for another 24 hours.  Cleared by podiatry for discharge.  Will be discharged on Keflex and doxycycline.  Patient already on doxycycline at home which he can continue.  ?

## 2022-01-12 NOTE — Assessment & Plan Note (Addendum)
-   Continue home medications 

## 2022-01-12 NOTE — Assessment & Plan Note (Addendum)
See discussion under osteomyelitis.  ?

## 2022-01-12 NOTE — ED Provider Notes (Signed)
? ?  [facility] ?Provider Note ? ? ? Event Date/Time  ? First MD Initiated Contact with Patient 01/12/22 1819   ?  (approximate) ? ?History  ? ?Chief Complaint: Infection ? ?HPI ? ?[name] is a 66 y.o. male with a past medical history of diabetes who presents to the emergency department for left great toe infection.  According to the patient he has been following up with Dr. Graciela Husbands of podiatry.  He has been on antibiotics for approximately 1 month and has had worsening infection/ulceration to the left great toe.  Patient was sent by Dr. Graciela Husbands to the emergency department for admission for IV antibiotics and amputation of the left great toe.  Patient had previously had a partial amputation to the distal aspect of the left great toe but has been told the entire toe needs to be removed now.  Patient denies any fever.  He has no other complaints.  States mild pain which has been chronic.  We will order the patient's gabapentin for him. ? ?Physical Exam  ? ?Triage Vital Signs: ?ED Triage Vitals  ?Enc Vitals Group  ?   BP 01/12/22 1643 117/85  ?   Pulse Rate 01/12/22 1643 99  ?   Resp 01/12/22 1643 18  ?   Temp 01/12/22 1643 98 ?F (36.7 ?C)  ?   Temp src --   ?   SpO2 01/12/22 1643 100 %  ?   Weight --   ?   Height --   ?   Head Circumference --   ?   Peak Flow --   ?   Pain Score 01/12/22 1640 5  ?   Pain Loc --   ?   Pain Edu? --   ?   Excl. in GC? --   ? ? ?Most recent vital signs: ?Vitals:  ? 01/12/22 1643  ?BP: 117/85  ?Pulse: 99  ?Resp: 18  ?Temp: 98 ?F (36.7 ?C)  ?SpO2: 100%  ? ? ?General: Awake, no distress.  ?CV:  Good peripheral perfusion.  Regular rate and rhythm  ?Resp:  Normal effort.  Equal breath sounds bilaterally.  ?Abd:  No distention.  Soft, nontender.  No rebound or guarding. ?Other:  Mild swelling with erythema to the left great toe with an ulceration to the plantar aspect of the toe. ? ? ?ED Results / Procedures / Treatments  ? ?MEDICATIONS ORDERED IN ED: ?Medications   ?gabapentin (NEURONTIN) capsule 900 mg (has no administration in time range)  ?vancomycin (VANCOCIN) IVPB 1000 mg/200 mL premix (has no administration in time range)  ? ? ? ?IMPRESSION / MDM / ASSESSMENT AND PLAN / ED COURSE  ?I reviewed the triage vital signs and the nursing notes. ? ?Patient presents emergency department for admission for worsening infection in the left great toe with ulceration despite home antibiotics.  Patient's labs are overall reassuring.  CBC is normal with a white count of 8.4.  Chemistry is normal besides mild renal insufficiency.  Lactate is elevated at 2.1.  We will send blood cultures start the patient on IV vancomycin.  Patient will be admitted to the hospitalist service. ? ?FINAL CLINICAL IMPRESSION(S) / ED DIAGNOSES  ? ?Left great toe cellulitis ?Left great toe ulceration ? ? ?Note:  This document was prepared using Dragon voice recognition software and may include unintentional dictation errors. ?  ?Minna Antis, MD ?01/12/22 1856 ? ?

## 2022-01-12 NOTE — Progress Notes (Signed)
Pharmacy Antibiotic Note ? ?[name] is a 66 y.o. male admitted on 01/12/2022 with  osteomyelitis .  Pharmacy has been consulted for Vancomycin dosing. ? ?Plan: ?Vancomycin 1 gm IV X 1 given on 3/15 @ 2011. ?Additional Vanc 1500 mg IV X 1 ordered to make total loading dose of 2500 mg. ?Vancomycin 1750 mg IV Q24H ordered to start on 3/16 @ 2000.  ? ?AUC = 468.5 ?Vanc trough = 11.1  ? ?Height: 6\' 2"  (188 cm) ?Weight: 102.7 kg (226 lb 8 oz) ?IBW/kg (Calculated) : 82.2 ? ?Temp (24hrs), Avg:98 ?F (36.7 ?C), Min:97.9 ?F (36.6 ?C), Max:98 ?F (36.7 ?C) ? ?Recent Labs  ?Lab 01/12/22 ?1643 01/12/22 ?2015  ?WBC 8.4  --   ?CREATININE 1.50*  --   ?LATICACIDVEN 2.1* 2.1*  ?  ?Estimated Creatinine Clearance: 61.1 mL/min (A) (by C-G formula based on SCr of 1.5 mg/dL (H)).   ? ?Not on File ? ?Antimicrobials this admission: ?  >>  ?  >>  ? ?Dose adjustments this admission: ? ? ?Microbiology results: ? BCx:  ? UCx:   ? Sputum:   ? MRSA PCR:  ? ?Thank you for allowing pharmacy to be a part of this patient?s care. ? ?Nanea Jared D ?01/12/2022 10:43 PM ? ?

## 2022-01-12 NOTE — H&P (Signed)
?History and Physical  ? ? ?Patient: [name] PVX:[id number] DOB: [birth year] ?DOA: 01/12/2022 ?DOS: the patient was seen and examined on 01/12/2022 ?PCP: Alm Bustard, NP  ?Patient coming from: Home ? ?Chief Complaint:  ?Chief Complaint  ?Patient presents with  ? Infection  ? ? ?HPI: [name] is a 66 y.o. male with medical history significant for DM, HTN, gout, depression, CKD 3A, left hemiparesis secondary to CVA, s/p partial amputation of the left great toe 2/15 secondary to osteomyelitis, with non healing plantar wound on the left foot ,on antibiotics for the past month who presents to the ED from podiatry due to concerns for osteomyelitis of the left great toe stump with plans for further amputation of the entire left toe.  Patient denies fever, chills.  ?ED course: Vitals within normal limits.  Blood work reviewed and significant for lactic acidosis of 2.1-2.1 with normal WBC.  Creatinine1.5 which appears to be baseline.  No x-rays done.  Blood cultures done.  Patient started on vancomycin.  Hospitalist consulted for admission. ? ?Review of Systems: As mentioned in the history of present illness. All other systems reviewed and are negative. ?Past Medical History:  ?Diagnosis Date  ? Diabetes mellitus without complication (HCC)   ? Gout   ? ?Past Surgical History:  ?Procedure Laterality Date  ? AMPUTATION TOE Left 12/16/2021  ? Procedure: AMPUTATION TOE - Left Great;  Surgeon: Linus Galas, DPM;  Location: [facility] ORS;  Service: Podiatry;  Laterality: Left;  ? LAPAROSCOPIC GASTRIC BANDING    ? ?Social History:  reports that he has never smoked. He has never used smokeless tobacco. He reports that he does not drink alcohol and does not use drugs. ? ?Not on File ? ?No family history on file. ? ?Prior to Admission medications   ?Medication Sig Start Date End Date Taking? Authorizing Provider  ?acetaminophen (TYLENOL) 325 MG tablet Take 2 tablets (650 mg total) by mouth every 6 (six) hours as needed for mild  pain (or Fever >/= 101). 12/17/21   Rodolph Bong, MD  ?allopurinol (ZYLOPRIM) 300 MG tablet Take 300 mg by mouth daily.    [provider]  ?aspirin 81 MG chewable tablet Chew 81 mg by mouth daily.    [provider]  ?atorvastatin (LIPITOR) 80 MG tablet Take 1 tablet (80 mg total) by mouth daily. 12/07/21 03/07/22  Darlin Priestly, MD  ?buPROPion (WELLBUTRIN XL) 150 MG 24 hr tablet Take 150 mg by mouth daily.    [provider]  ?Ergocalciferol (VITAMIN D2) 50 MCG (2000 UT) TABS Take 2,000 Units by mouth daily.    [provider]  ?gabapentin (NEURONTIN) 300 MG capsule Take 3 capsules (900 mg total) by mouth 3 (three) times daily. 12/17/21   Rodolph Bong, MD  ?lisinopril (ZESTRIL) 10 MG tablet Take 0.5 tablets (5 mg total) by mouth daily. 12/20/21   Rodolph Bong, MD  ?magnesium oxide (MAG-OX) 400 MG tablet Take 400 mg by mouth daily.    [provider]  ?metFORMIN (GLUCOPHAGE) 500 MG tablet Take 1,000 mg by mouth 2 (two) times daily with a meal.    [provider]  ?QUEtiapine (SEROQUEL) 25 MG tablet Take 25 mg by mouth at bedtime.    [provider]  ?rOPINIRole (REQUIP) 0.5 MG tablet Take 0.5 mg by mouth at bedtime.    [provider]  ?Semaglutide 7 MG TABS Take 7 mg by mouth daily.    [provider]  ?vitamin B-12 (CYANOCOBALAMIN)  1000 MCG tablet Take 1,000 mcg by mouth daily.    [provider]  ? ? ?Physical Exam: ?Vitals:  ? 01/12/22 1643 01/12/22 1956  ?BP: 117/85 117/76  ?Pulse: 99 86  ?Resp: 18 18  ?Temp: 98 ?F (36.7 ?C)   ?SpO2: 100% 100%  ? ?Physical Exam ?Vitals and nursing note reviewed.  ?Constitutional:   ?   General: He is not in acute distress. ?   Appearance: Normal appearance.  ?HENT:  ?   Head: Normocephalic and atraumatic.  ?Cardiovascular:  ?   Rate and Rhythm: Normal rate and regular rhythm.  ?   Pulses: Normal pulses.  ?   Heart sounds: Normal heart sounds. No murmur heard. ?Pulmonary:  ?    Effort: Pulmonary effort is normal.  ?   Breath sounds: Normal breath sounds. No wheezing or rhonchi.  ?Abdominal:  ?   General: Bowel sounds are normal.  ?   Palpations: Abdomen is soft.  ?   Tenderness: There is no abdominal tenderness.  ?Musculoskeletal:     ?   General: No swelling or tenderness. Normal range of motion.  ?   Cervical back: Normal range of motion and neck supple.  ?   Comments: See picture below of left great toe. Draining wound plantar aspect of left great toe stump  ?Skin: ?   General: Skin is warm and dry.  ?Neurological:  ?   General: No focal deficit present.  ?   Mental Status: He is alert. Mental status is at baseline.  ?Psychiatric:     ?   Mood and Affect: Mood normal.     ?   Behavior: Behavior normal.  ? ? ? ? ?Data Reviewed: ?Relevant notes from primary care and specialist visits, past discharge summaries as available in EHR, including Care Everywhere. ?Prior diagnostic testing as pertinent to current admission diagnoses ?Updated medications and problem lists for reconciliation ?ED course, including vitals, labs, imaging, treatment and response to treatment ?Triage notes, nursing and pharmacy notes and ED provider's notes ?Notable results as noted in HPI ?Blood work reviewed and significant for lactic acidosis of 2.1-2.1 with normal WBC.  Creatinine1.5 which appears to be baseline.  No x-rays done. ? ?Assessment and Plan: ?* Left hallux osteomyelitis ([place]) ?S/p partial amputation left great toe on 2/15 ?Rocephin and vancomycin ?Podiatry consult ?N.p.o. from midnight ? ?Cellulitis of great toe of left foot ?Antibiotics as above ? ?Lactic acidosis ?Appears chronically elevated but sepsis not suspected at this time ?IV hydration and monitor for improvement ? ?Depression ?Continue Wellbutrin and Seroquel pending med verification ? ?Stage 3a chronic kidney disease ([place]) ?Creatinine 1.5 which appears to be patient's baseline ? Avoid nephrotoxins and continue to monitor ? ?Hemiparesis  affecting left side as late effect of cerebrovascular accident (CVA) ([place]) ?Maintained on aspirin and high intensity statin ?We will hold Plavix ? ?Gout ?Stable.  Continue home allopurinol ? ?Diabetes mellitus type 2, uncomplicated ([place]) ?Hold home meds in preparation for likely surgery in the a.m. ?Sliding scale insulin ? ?  ? ? ? ? ?Advance Care Planning:   Code Status: Prior  ? ?Consults: podiatry [facility], Dr Jens Som ? ?Family Communication: none ? ?Severity of Illness: ?The appropriate patient status for this patient is INPATIENT. Inpatient status is judged to be reasonable and necessary in order to provide the required intensity of service to ensure the patient's safety. The patient's presenting symptoms, physical exam findings, and initial radiographic and laboratory data in the context of their chronic comorbidities  is felt to place them at high risk for further clinical deterioration. Furthermore, it is not anticipated that the patient will be medically stable for discharge from the hospital within 2 midnights of admission.  ? ?* I certify that at the point of admission it is my clinical judgment that the patient will require inpatient hospital care spanning beyond 2 midnights from the point of admission due to high intensity of service, high risk for further deterioration and high frequency of surveillance required.* ? ?Author: ?Athena Masse, MD ?01/12/2022 9:37 PM ? ?For on call review [email or web address].  ?

## 2022-01-12 NOTE — ED Triage Notes (Signed)
Pt comes with c/o left big toe infection and plans for surgery tomorrow per MD Clide Cliff.  ?

## 2022-01-12 NOTE — Progress Notes (Addendum)
Cross Cover ?Nurse reports patient indicated he was a DNR when asked about advanced directives.  Bedside patient confirms he hjas advanced directive papers at home and he is a DNR and Chauncey Reading is his daughter Laurina Bustle. Code status changed in computer ?

## 2022-01-12 NOTE — Progress Notes (Signed)
Cross Cover ?Patient requesting night time seroquel.  Verifies with nurse last dose last note. Seroquel 25 mg daily at bedtime ordered from home meds ? ?

## 2022-01-12 NOTE — Assessment & Plan Note (Addendum)
Continue home medications.  It was documented that patient was on aspirin and Plavix.  Patient mentioned that he was told to take Plavix for 3 weeks and then stop.  He is currently only on aspirin.  ?

## 2022-01-12 NOTE — Assessment & Plan Note (Addendum)
AKI ? ?Previous labs reviewed.  His creatinine was 2.25 on 2/6.  Stabilized around 1.37. ?Presented with creatinine of 1.5.  Climbed up to 1.8.  Gently hydrated with improvement.   ?

## 2022-01-12 NOTE — Progress Notes (Addendum)
NESBIT, MICHON ([id number]) ?Visit Report for 01/12/2022 ?Chief Complaint Document Details ?Patient Name: EDWING, FIGLEY ?Date of Service: 01/12/2022 9:15 AM ?Medical Record Number: [id number] ?Patient Account Number: [id number] ?Date of Birth/Sex: [birth year] (66 y.o. M) ?Treating RN: Donnamarie Poag ?Primary Care Provider: [name] Other Clinician: ?Referring Provider: [name] ?Treating Provider/Extender: Kalman Shan ?Weeks in Treatment: 9 ?Information Obtained from: Patient ?Chief Complaint ?Right plantar foot wound ?Electronic Signature(s) ?Signed: 01/12/2022 10:05:44 AM By: Kalman Shan DO ?Entered By: Kalman Shan on 01/12/2022 09:55:43 ?Warshawsky, Athen ([id number]) ?-------------------------------------------------------------------------------- ?HPI Details ?Patient Name: SLAYTON, LUBITZ ?Date of Service: 01/12/2022 9:15 AM ?Medical Record Number: [id number] ?Patient Account Number: [id number] ?Date of Birth/Sex: 07/07/1956 (66 y.o. M) ?Treating RN: Donnamarie Poag ?Primary Care Provider: [name] Other Clinician: ?Referring Provider: [name] ?Treating Provider/Extender: Kalman Shan ?Weeks in Treatment: 9 ?History of Present Illness ?HPI Description: Admission 11/10/2021 ?Mr. Joy Haegele is a 66 year old male with a past medical history of uncontrolled type 2 diabetes with last hemoglobin G3O of 8.1 complicated by ?peripheral neuropathy that presents to the clinic for a 2-year history of nonhealing ulcer to the bottom of his right foot. He states this started out ?as a blister caused by a work boot. He reports receiving wound care when he resided in [place]. He is reestablishing his wound care in [place] today. He is currently keeping the area clean and covered. He has insoles designed to help offload the wound bed. He currently ?denies signs of infection. ?1/18; patient presents for follow-up. He has been using Hydrofera Blue to the wound bed. He has no issues or  complaints today. He has not ?received the defender.[name] He denies signs of infection. ?2/1; patient presents for follow-up. He has been using Hydrofera Blue to the wound bed he states he received the defender boot and has been ?using it however he did not have it on today. He currently denies signs of infection to the right foot. Unfortunately he developed a wound to the ?left great toe over the past week. He states he received new orthotics and these caused a blister to the left great toe which turned into a wound. ?He has not been doing anything to the wound bed. He currently denies systemic signs of infection. ?2/8; Patient presents for follow-up. Since last seen in the clinic he experienced a CVA and was hospitalized for 2 days. 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 He states he is about 90% recovered. He has some mild weakness to the left side. He reports ?taking the antibiotics prescribed at last clinic visit. He reports using Hydrofera Blue for dressing changes. He only uses the offloading boot when he ?is at home. He uses open toed shoes to the right foot. He currently denies signs of infection. ?2/15; patient presents for follow-up. He states he is still taking the antibiotics prescribed. He reports using the defender boot to the right foot and ?open toed shoe to the left foot however he is not wearing either today. He currently denies systemic signs of infection. He stated he cut down a ?tree last week and was outside doing yard work. ?2/22; patient presents for follow-up. 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 He reports feeling well. He reports ?using the defender boot to the right leg and continues to use Hydrofera Blue with dressing changes. He denies systemic signs of infection. ?3/8; patient presents for follow-up. He sees Dr. Cleda Mccreedy tomorrow for follow-up of the left  great toe amputation. This was a partial amputation. He ?reports  he is taking Augmentin currently. He reports increased erythema to the left great toe amputation site. He also reports a decline in his right ?plantar foot wound. He reports it is draining more. He denies purulent drainage. ?3/15; patient presents for follow-up. He did not see Dr. Cleda Mccreedy last week but states he sees him today. He states he has been taking doxycycline in ?the past week and started gentamicin ointment. He continues to use [facility] with dressing changes. He currently denies systemic signs of ?infection. ?Electronic Signature(s) ?Signed: 01/12/2022 10:05:44 AM By: Kalman Shan DO ?Entered By: Kalman Shan on 01/12/2022 10:01:16 ?Borum, Dalin ([id number]) ?-------------------------------------------------------------------------------- ?Physical Exam Details ?Patient Name: HUMBERTO, ADDO ?Date of Service: 01/12/2022 9:15 AM ?Medical Record Number: [id number] ?Patient Account Number: [id number] ?Date of Birth/Sex: 10-06-1956 (66 y.o. M) ?Treating RN: Donnamarie Poag ?Primary Care Provider: [name] Other Clinician: ?Referring Provider: [name] ?Treating Provider/Extender: Kalman Shan ?Weeks in Treatment: 9 ?Constitutional ?. ?Cardiovascular ?[name] ?Psychiatric ?[name] ?Notes ?Right foot: To the plantar aspect there is a punched-out ulcer with granulation tissue and undermining to the lateral aspect. Tunneling to the 10 ?o'clock position. ?Electronic Signature(s) ?Signed: 01/12/2022 10:05:44 AM By: Kalman Shan DO ?Entered By: Kalman Shan on 01/12/2022 10:02:50 ?Akamine, Remijio ([id number]) ?-------------------------------------------------------------------------------- ?Physician Orders Details ?Patient Name: ORBY, TANGEN ?Date of Service: 01/12/2022 9:15 AM ?Medical Record Number: [id number] ?Patient Account Number: [id number] ?Date of Birth/Sex: July 31, 1956 (66 y.o. M) ?Treating RN: Donnamarie Poag ?Primary Care Provider: [name] Other Clinician: ?Referring Provider: [name] ?Treating Provider/Extender: Kalman Shan ?Weeks in Treatment: 9 ?Verbal / Phone Orders: No ?Diagnosis Coding ?ICD-10 Coding ?Code Description ?L97.512 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ed ?L97.528 Non-pressure chronic ulcer of other part of left foot with other specified severity ?E11.621 Type 2 diabetes mellitus with foot ulcer ?E11.40 Type 2 diabetes mellitus with diabetic neuropathy, unspecified ?R26.89 Other abnormalities of gait and mobility ?Follow-up Appointments ?o Return Appointment in 1 week. ?o Nurse Visit as needed ?Bathing/ Shower/ Hygiene ?o May shower; gently cleanse wound with antibacterial soap, rinse and pat dry prior to dressing wounds - change dressing after ?shower or keep dressing dry; keep covered ?o No tub bath. ?Anesthetic (Use 'Patient Medications' Section for Anesthetic Order Entry) ?o Lidocaine applied to wound bed ?Edema Control - Lymphedema / Segmental Compressive Device / Other ?o Elevate legs to the level of the heart and pump ankles as often as possible ?o Elevate leg(s) parallel to the floor when sitting. ?o DO YOUR BEST to sleep in the bed at night. DO NOT sleep in your recliner. Long hours of sitting in a recliner leads to ?swelling of the legs and/or potential wounds on your backside. ?Off-Loading ?o Foot Defender o - Right foot-MAKE SURE A SOCK OR STOCKINETTE COVERS YOUR SKIN AND NOT IN CONTACT WITH THE BOOT ?o Open toe surgical shoe - left ?Additional Orders / Instructions ?o Follow Nutritious Diet and Increase Protein Intake ?o Other: - Follow with Dr. Caryl Comes for left foot great toe post surgery ?Wound Treatment ?Wound #1 - Foot Wound Laterality: Plantar, Right ?Cleanser: [employer] Ancillary Kit - 15 Day Supply (Generic) 1 x Per Day/15 Days ?Discharge Instructions: Use supplies as instructed; Kit contains: (15) Saline Bullets; (15) 3x3 Gauze; 15 pr Gloves ?Cleanser: Soap and Water 1 x Per Day/15  Days ?Discharge Instructions: Gently cleanse wound with antibacterial soap, rinse and pat dry prior to dressing wounds ?Cleanser: Wound Cleanser 1  x Per Day/15 Days ?Discharge Instructions: Wash your hands with soap a

## 2022-01-12 NOTE — Assessment & Plan Note (Addendum)
Lactic acid level was minimally elevated but no ongoing concern for sepsis at this time.   ?

## 2022-01-12 NOTE — Progress Notes (Signed)
HAGOP, MCCOLLAM ([id number]) ?Visit Report for 01/12/2022 ?Arrival Information Details ?Patient Name: [name], [name] ?Date of Service: 01/12/2022 9:15 AM ?Medical Record Number: [id number] ?Patient Account Number: [id number] ?Date of Birth/Sex: 06-25-1956 (66 y.o. M) ?Treating RN: Hansel Feinstein ?Primary Care Slyvester Latona: Manson Allan Other Clinician: ?Referring [place] Willow: Manson Allan ?Treating Scharlene Catalina/Extender: Geralyn Corwin ?Weeks in Treatment: 9 ?Visit Information History Since Last Visit ?Added or deleted any medications: No ?Patient Arrived: Ambulatory ?Had a fall or experienced change in No ?Arrival Time: 09:32 ?activities of daily living that may affect ?Accompanied By: self ?risk of falls: ?Transfer Assistance: None ?Hospitalized since last visit: No ?Patient Identification Verified: Yes ?Has Dressing in Place as Prescribed: Yes ?Secondary Verification Process Completed: Yes ?Has Compression in Place as Prescribed: Yes ?Patient Requires Transmission-Based No ?Has Footwear/Offloading in Place as Prescribed: No ?Precautions: ?Pain Present Now: No ?Patient Has Alerts: Yes ?Patient Alerts: Patient on Blood ?Thinner ?DIABETIC ?Plavix ?Notes ?stated he wears the Foot Defender on the right foot at home but cannot walk safety to appt with that boot and wears sneaker to appt on right foot ?Electronic Signature(s) ?Signed: 01/12/2022 11:56:30 AM By: Hansel Feinstein ?Entered ByHansel Feinstein on 01/12/2022 10:05:58 ?[name], [name] ([id number]) ?-------------------------------------------------------------------------------- ?Clinic Level of Care Assessment Details ?Patient Name: [name], [name] ?Date of Service: 01/12/2022 9:15 AM ?Medical Record Number: [id number] ?Patient Account Number: [id number] ?Date of Birth/Sex: 05-Jul-1956 (66 y.o. M) ?Treating RN: Hansel Feinstein ?Primary Care Menaal Russum: Manson Allan Other Clinician: ?Referring Patryce Depriest: Manson Allan ?Treating Saim Almanza/Extender: Geralyn Corwin ?Weeks in Treatment: 9 ?Clinic  Level of Care Assessment Items ?TOOL 4 Quantity Score ?[]  - Use when only an EandM is performed on FOLLOW-UP visit 0 ?ASSESSMENTS - Nursing Assessment / Reassessment ?[]  - Reassessment of Co-morbidities (includes updates in patient status) 0 ?[]  - 0 ?Reassessment of Adherence to Treatment Plan ?ASSESSMENTS - Wound and Skin Assessment / Reassessment ?X - Simple Wound Assessment / Reassessment - one wound 1 5 ?[]  - 0 ?Complex Wound Assessment / Reassessment - multiple wounds ?[]  - 0 ?Dermatologic / Skin Assessment (not related to wound area) ?ASSESSMENTS - Focused Assessment ?[]  - Circumferential Edema Measurements - multi extremities 0 ?[]  - 0 ?Nutritional Assessment / Counseling / Intervention ?[]  - 0 ?Lower Extremity Assessment (monofilament, tuning fork, pulses) ?[]  - 0 ?Peripheral Arterial Disease Assessment (using hand held doppler) ?ASSESSMENTS - Ostomy and/or Continence Assessment and Care ?[]  - Incontinence Assessment and Management 0 ?[]  - 0 ?Ostomy Care Assessment and Management (repouching, etc.) ?PROCESS - Coordination of Care ?X - Simple Patient / Family Education for ongoing care 1 15 ?[]  - 0 ?Complex (extensive) Patient / Family Education for ongoing care ?[]  - 0 ?Staff obtains Consents, Records, Test Results / Process Orders ?[]  - 0 ?Staff telephones HHA, Nursing Homes / Clarify orders / etc ?[]  - 0 ?Routine Transfer to another Facility (non-emergent condition) ?[]  - 0 ?Routine Hospital Admission (non-emergent condition) ?[]  - 0 ?New Admissions / / Ordering NPWT, Apligraf, etc. ?[]  - 0 ?Emergency Hospital Admission (emergent condition) ?X- 1 10 ?Simple Discharge Coordination ?[]  - 0 ?Complex (extensive) Discharge Coordination ?PROCESS - Special Needs ?[]  - Pediatric / Minor Patient Management 0 ?[]  - 0 ?Isolation Patient Management ?[]  - 0 ?Hearing / Language / Visual special needs ?[]  - 0 ?Assessment of Community assistance (transportation, D/C planning, etc.) ?[]  -  0 ?Additional assistance / Altered mentation ?[]  - 0 ?Support Surface(s) Assessment (bed, cushion, seat, etc.) ?INTERVENTIONS - Wound Cleansing / Measurement ?[name], [name] ( ) ?X- 1 5 ?Simple Wound Cleansing -  one wound ?[]  - 0 ?Complex Wound Cleansing - multiple wounds ?X- 1 5 ?Wound Imaging (photographs - any number of wounds) ?[]  - 0 ?Wound Tracing (instead of photographs) ?X- 1 5 ?Simple Wound Measurement - one wound ?[]  - 0 ?Complex Wound Measurement - multiple wounds ?INTERVENTIONS - Wound Dressings ?X - Small Wound Dressing one or multiple wounds 1 10 ?[]  - 0 ?Medium Wound Dressing one or multiple wounds ?[]  - 0 ?Large Wound Dressing one or multiple wounds ?X- 1 5 ?Application of Medications - topical ?[]  - 0 ?Application of Medications - injection ?INTERVENTIONS - Miscellaneous ?[]  - External ear exam 0 ?[]  - 0 ?Specimen Collection (cultures, biopsies, blood, body fluids, etc.) ?[]  - 0 ?Specimen(s) / Culture(s) sent or taken to Lab for analysis ?[]  - 0 ?Patient Transfer (multiple staff / / Similar devices) ?[]  - 0 ?Simple Staple / Suture removal (25 or less) ?[]  - 0 ?Complex Staple / Suture removal (26 or more) ?[]  - 0 ?Hypo / Hyperglycemic Management (close monitor of Blood Glucose) ?[]  - 0 ?Ankle / Brachial Index (ABI) - do not check if billed separately ?X- 1 5 ?Vital Signs ?Has the patient been seen at the hospital within the last three years: Yes ?Total Score: 65 ?Level Of Care: New/Established - Level ?2 ?Electronic Signature(s) ?Signed: 01/12/2022 11:56:30 AM By: ?Entered By on 01/12/2022 10:03:48 ?Kishimoto, Taedyn ( ) ?-------------------------------------------------------------------------------- ?Encounter Discharge Information Details ?Patient Name: [name], [name] ?Date of Service: 01/12/2022 9:15 AM ?Medical Record Number: ?Patient Account Number: ?Date of Birth/Sex: November 06, 1955 (66 y.o. M) ?Treating RN: ?Primary  Care Ilma Achee: Other Clinician: ?Referring Tanisia Yokley: ?Treating Adriella Essex/Extender: 01/14/2022 ?Weeks in Treatment: 9 ?Encounter Discharge Information Items ?Discharge Condition: Stable ?Ambulatory Status: Ambulatory ?Discharge Destination: Home ?Transportation: Private Auto ?Accompanied By: self ?Schedule Follow-up Appointment: Yes ?Clinical Summary of Care: ?Electronic Signature(s) ?Signed: 01/12/2022 11:56:30 AM By: Hansel Feinstein ?Entered By03/17/2023 on 01/12/2022 10:05:09 ?Flemister, Lonie ([name]) ?-------------------------------------------------------------------------------- ?Lower Extremity Assessment Details ?Patient Name: [name], [name] ?Date of Service: 01/12/2022 9:15 AM ?Medical Record Number: [id number] ?Patient Account Number: 10/24/1956 ?Date of Birth/Sex: 01-05-1956 (66 y.o. M) ?Treating RN: Manson Allan ?Primary Care Ilijah Doucet: Manson Allan Other Clinician: ?Referring Noa Galvao: Geralyn Corwin ?Treating Baileigh Modisette/Extender: 01/14/2022 ?Weeks in Treatment: 9 ?Edema Assessment ?Assessed: [Left: No] [Right: Yes] ?Edema: [Left: N] [Right: o] ?Vascular Assessment ?Pulses: ?Dorsalis Pedis ?Palpable: [Right:Yes] ?Electronic Signature(s) ?Signed: 01/12/2022 11:56:30 AM By: Hansel Feinstein ?Entered By03/17/2023 on 01/12/2022 09:41:30 ?Birks, Burdett ([name]) ?-------------------------------------------------------------------------------- ?Multi Wound Chart Details ?Patient Name: [name], [name] ?Date of Service: 01/12/2022 9:15 AM ?Medical Record Number: [id number] ?Patient Account Number: 10/24/1956 ?Date of Birth/Sex: 08-Jun-1956 (66 y.o. M) ?Treating RN: Manson Allan ?Primary Care Nichelle Renwick: Manson Allan Other Clinician: ?Referring Adina Puzzo: Geralyn Corwin ?Treating Emonie Espericueta/Extender: 01/14/2022 ?Weeks in Treatment: 9 ?Vital Signs ?Height(in): 74 ?Pulse(bpm): 105 ?Weight(lbs): 226 ?Blood Pressure(mmHg): 127/83 ?Body Mass Index(BMI): 29 ?Temperature(??F):  97.6 ?Respiratory Rate(breaths/min): 16 ?Photos: [1:No Photos] [N/A:N/A] ?Wound Location: [1:Right, Plantar Foot] [N/A:N/A] ?Wounding Event: [1:Blister] [N/A:N/A] ?Primary Etiology: [1:Diabetic Wound/Ulcer of the Lower Ex

## 2022-01-12 NOTE — Assessment & Plan Note (Addendum)
Stable. ?-Continue home allopurinol ?

## 2022-01-13 ENCOUNTER — Encounter: Payer: Self-pay | Admitting: Internal Medicine

## 2022-01-13 DIAGNOSIS — N1831 Chronic kidney disease, stage 3a: Secondary | ICD-10-CM | POA: Diagnosis not present

## 2022-01-13 DIAGNOSIS — E119 Type 2 diabetes mellitus without complications: Secondary | ICD-10-CM | POA: Diagnosis not present

## 2022-01-13 DIAGNOSIS — M869 Osteomyelitis, unspecified: Secondary | ICD-10-CM | POA: Diagnosis not present

## 2022-01-13 DIAGNOSIS — L0390 Cellulitis, unspecified: Secondary | ICD-10-CM | POA: Diagnosis not present

## 2022-01-13 LAB — GLUCOSE, CAPILLARY
Glucose-Capillary: 134 mg/dL — ABNORMAL HIGH (ref 70–99)
Glucose-Capillary: 157 mg/dL — ABNORMAL HIGH (ref 70–99)
Glucose-Capillary: 142 mg/dL — ABNORMAL HIGH (ref 70–99)
Glucose-Capillary: 107 mg/dL — ABNORMAL HIGH (ref 70–99)
Glucose-Capillary: 209 mg/dL — ABNORMAL HIGH (ref 70–99)
Glucose-Capillary: 159 mg/dL — ABNORMAL HIGH (ref 70–99)
Glucose-Capillary: 127 mg/dL — ABNORMAL HIGH (ref 70–99)

## 2022-01-13 NOTE — Plan of Care (Signed)
?  Problem: Education: ?Goal: Knowledge of General Education information will improve ?Description: Including pain rating scale, medication(s)/side effects and non-pharmacologic comfort measures ?Outcome: Progressing ?  ?Problem: Health Behavior/Discharge Planning: ?Goal: Ability to manage health-related needs will improve ?Outcome: Progressing ?  ?Problem: Clinical Measurements: ?Goal: Ability to maintain clinical measurements within normal limits will improve ?Outcome: Progressing ?Goal: Will remain free from infection ?Outcome: Progressing ?Goal: Diagnostic test results will improve ?Outcome: Progressing ?Goal: Respiratory complications will improve ?Outcome: Progressing ?Goal: Cardiovascular complication will be avoided ?Outcome: Progressing ?  ?Problem: Activity: ?Goal: Risk for activity intolerance will decrease ?Outcome: Progressing ?  ?Problem: Nutrition: ?Goal: Adequate nutrition will be maintained ?Outcome: Progressing ?  ?Problem: Coping: ?Goal: Level of anxiety will decrease ?Outcome: Progressing ?  ?Problem: Elimination: ?Goal: Will not experience complications related to bowel motility ?Outcome: Progressing ?Goal: Will not experience complications related to urinary retention ?Outcome: Progressing ?  ?Problem: Pain Managment: ?Goal: General experience of comfort will improve ?Outcome: Progressing ?  ?Problem: Safety: ?Goal: Ability to remain free from injury will improve ?Outcome: Progressing ?  ?Problem: Skin Integrity: ?Goal: Risk for impaired skin integrity will decrease ?Outcome: Progressing ?  ?Problem: Pain Management: ?Goal: Pain level will decrease with appropriate interventions ?Outcome: Progressing ?  ?Problem: Skin Integrity: ?Goal: Demonstration of wound healing without infection will improve ?Outcome: Progressing ?  ?

## 2022-01-13 NOTE — Progress Notes (Signed)
? ?  Subjective/Chief Complaint: ?Patient seen.  No complaints. ? ? ?Objective: ?Vital signs in last 24 hours: ?Temp:  [97.7 ?F (36.5 ?C)-98 ?F (36.7 ?C)] 97.7 ?F (36.5 ?C) (03/16 1144) ?Pulse Rate:  [72-99] 73 (03/16 1144) ?Resp:  [14-18] 18 (03/16 1144) ?BP: (104-126)/(67-85) 123/83 (03/16 1144) ?SpO2:  [95 %-100 %] 98 % (03/16 1144) ?Weight:  [102.7 kg] 102.7 kg (03/15 2222) ?Last BM Date : 01/12/22 ? ?Intake/Output from previous day: ?03/15 0701 - 03/16 0700 ?In: 785.1 [I.V.:185.8; IV Piggyback:599.2] ?Out: -  ?Intake/Output this shift: ?No intake/output data recorded. ? ?Bandages dry and intact on the left foot.  Patient seen outpatient in the clinic yesterday with full-thickness ulceration on the left hallux amputation stump probing down to the level of the bone with x-ray changes consistent with osteomyelitis.  Pulses are diminished but palpable.  Loss of sensation distally in the forefoot and toes. ? ? ?Lab Results:  ?Recent Labs  ?  01/12/22 ?1643  ?WBC 8.4  ?HGB 13.8  ?HCT 43.5  ?PLT 307  ? ?BMET ?Recent Labs  ?  01/12/22 ?1643  ?NA 135  ?K 4.1  ?CL 98  ?CO2 27  ?GLUCOSE 115*  ?BUN 22  ?CREATININE 1.50*  ?CALCIUM 9.5  ? ?PT/INR ?No results for input(s): LABPROT, INR in the last 72 hours. ?ABG ?No results for input(s): PHART, HCO3 in the last 72 hours. ? ?Invalid input(s): PCO2, PO2 ? ?Studies/Results: ?No results found. ? ?Anti-infectives: ?Anti-infectives (From admission, onward)  ? ? Start     Dose/Rate Route Frequency Ordered Stop  ? 01/13/22 2000  vancomycin (VANCOREADY) IVPB 1750 mg/350 mL       ? 1,750 mg ?175 mL/hr over 120 Minutes Intravenous Every 24 hours 01/12/22 2242    ? 01/12/22 2330  vancomycin (VANCOREADY) IVPB 1500 mg/300 mL       ? 1,500 mg ?150 mL/hr over 120 Minutes Intravenous  Once 01/12/22 2231 01/13/22 0253  ? 01/12/22 2215  cefTRIAXone (ROCEPHIN) 1 g in sodium chloride 0.9 % 100 mL IVPB       ? 1 g ?200 mL/hr over 30 Minutes Intravenous Every 24 hours 01/12/22 2203    ? 01/12/22  1900  vancomycin (VANCOCIN) IVPB 1000 mg/200 mL premix       ? 1,000 mg ?200 mL/hr over 60 Minutes Intravenous  Once 01/12/22 1854 01/12/22 2137  ? ?  ? ? ?Assessment/Plan: ?s/p Procedure(s): ?AMPUTATION TOE (Left) ?Assessment: 1.  Osteomyelitis left hallux. 2.  Diabetes with neuropathy. ? ?Plan: Discussed with the patient surgery planned for this afternoon for revision amputation of the left great toe with possible partial ray resection if needed.  Discussed risks and complications of the procedure including but not limited to inability of the wound to heal due to his diabetes or circulation or continued infection.  No guarantees could be given as to the outcome.  Questions invited and answered.  Consent form for amputation with partial ray resection left hallux.  Patient is currently NPO.  Plan for surgery later this afternoon. ? LOS: 1 day  ? ? ?Ranette Luckadoo W Evren Shankland ?01/13/2022 ? ?

## 2022-01-13 NOTE — Plan of Care (Signed)
Patient sleeping between care. Aox4. Reports minimal pain. Plan of care reviewed with patient. NPO since midnight. Call bell within reach.  ? ? ?Problem: Education: ?Goal: Knowledge of General Education information will improve ?Description: Including pain rating scale, medication(s)/side effects and non-pharmacologic comfort measures ?Outcome: Progressing ?  ?Problem: Health Behavior/Discharge Planning: ?Goal: Ability to manage health-related needs will improve ?Outcome: Progressing ?  ?Problem: Clinical Measurements: ?Goal: Ability to maintain clinical measurements within normal limits will improve ?Outcome: Progressing ?Goal: Will remain free from infection ?Outcome: Progressing ?Goal: Diagnostic test results will improve ?Outcome: Progressing ?Goal: Respiratory complications will improve ?Outcome: Progressing ?Goal: Cardiovascular complication will be avoided ?Outcome: Progressing ?  ?Problem: Activity: ?Goal: Risk for activity intolerance will decrease ?Outcome: Progressing ?  ?Problem: Nutrition: ?Goal: Adequate nutrition will be maintained ?Outcome: Progressing ?  ?Problem: Coping: ?Goal: Level of anxiety will decrease ?Outcome: Progressing ?  ?Problem: Elimination: ?Goal: Will not experience complications related to bowel motility ?Outcome: Progressing ?Goal: Will not experience complications related to urinary retention ?Outcome: Progressing ?  ?Problem: Pain Managment: ?Goal: General experience of comfort will improve ?Outcome: Progressing ?  ?Problem: Safety: ?Goal: Ability to remain free from injury will improve ?Outcome: Progressing ?  ?Problem: Skin Integrity: ?Goal: Risk for impaired skin integrity will decrease ?Outcome: Progressing ?  ?Problem: Education: ?Goal: Knowledge of the prescribed therapeutic regimen will improve ?Outcome: Progressing ?Goal: Ability to verbalize activity precautions or restrictions will improve ?Outcome: Progressing ?Goal: Understanding of discharge needs will  improve ?Outcome: Progressing ?  ?Problem: Activity: ?Goal: Ability to perform//tolerate increased activity and mobilize with assistive devices will improve ?Outcome: Progressing ?  ?Problem: Clinical Measurements: ?Goal: Postoperative complications will be avoided or minimized ?Outcome: Progressing ?  ?Problem: Self-Care: ?Goal: Ability to meet self-care needs will improve ?Outcome: Progressing ?  ?Problem: Self-Concept: ?Goal: Ability to maintain and perform role responsibilities to the fullest extent possible will improve ?Outcome: Progressing ?  ?Problem: Pain Management: ?Goal: Pain level will decrease with appropriate interventions ?Outcome: Progressing ?  ?Problem: Skin Integrity: ?Goal: Demonstration of wound healing without infection will improve ?Outcome: Progressing ?  ?

## 2022-01-13 NOTE — Hospital Course (Addendum)
66 y.o. male with medical history significant for DM, HTN, gout, depression, CKD 3A, left hemiparesis secondary to CVA, s/p partial amputation of the left great toe 2/15 secondary to osteomyelitis, with non healing plantar wound on the left foot ,on antibiotics for the past month who presented to the ED from podiatry due to concerns for osteomyelitis of the left great toe stump with plans for further amputation of the entire left toe.  Patient was started on vancomycin and hospitalized for further management.  Patient underwent amputation of the hallux on 3/17.   ?

## 2022-01-13 NOTE — Progress Notes (Signed)
? ?[facility] ?PROGRESS NOTE ? ? ?[name] [id number] DOB: Jan 13, 1956 DOA: 01/12/2022  1 ?DOS: the patient was seen and examined on 01/13/2022 ? ?PCP: Peggye Form, NP ? ?Brief History and Hospital Course:  ?66 y.o. male with medical history significant for DM, HTN, gout, depression, CKD 3A, left hemiparesis secondary to CVA, s/p partial amputation of the left great toe 2/15 secondary to osteomyelitis, with non healing plantar wound on the left foot ,on antibiotics for the past month who presented to the ED from podiatry due to concerns for osteomyelitis of the left great toe stump with plans for further amputation of the entire left toe.  Patient was started on vancomycin and hospitalized for further management. ? ?Consultants: Podiatry ? ?Procedures: None ? ? ? ?Subjective: ?Denies any significant pain in his left foot.  No chest pain shortness of breath.  No nausea vomiting. ? ? ? ?Assessment/Plan: ? ? ?* Left hallux osteomyelitis ([place]) ?S/p partial amputation left great toe on 2/15.  Followed by podiatry.  Concern for osteomyelitis.  Patient currently on vancomycin and ceftriaxone.  Plan is for surgery this evening. ? ?Cellulitis of great toe of left foot ?See discussion under osteomyelitis.  Continue antibiotics. ? ?Lactic acidosis ?Lactic acid level was minimally elevated but no ongoing concern for sepsis at this time.   ? ?Diabetes mellitus type 2, uncomplicated ([place]) ?Home medications on hold currently.  Continue SSI.  Monitor CBGs.   ? ?Stage 3a chronic kidney disease ([place]) ?Renal function seems to be close to baseline.  Avoid nephrotoxic agents.  Monitor urine output.   ? ?Hemiparesis affecting left side as late effect of cerebrovascular accident (CVA) ([place]) ?Plavix on hold.  Aspirin and statin being continued. ? ?Depression ?Continue home medications. ? ?Gout ?Stable.  Continue home allopurinol. ? ? ? ? ?DVT Prophylaxis: None currently since plan is for surgery this afternoon ?Code  Status: DNR ?Family Communication: Discussed with patient ?Disposition Plan: Hopefully return home in improved ? ?Status is: Inpatient ?Remains inpatient appropriate because: Concern for osteomyelitis of the left hallux.  IV antibiotics.  Need for surgical intervention. ? ? ? ? ?Medications: Scheduled: ? allopurinol  300 mg Oral Daily  ? aspirin  81 mg Oral Daily  ? atorvastatin  80 mg Oral Daily  ? gabapentin  900 mg Oral TID  ? insulin aspart  0-15 Units Subcutaneous Q4H  ? QUEtiapine  25 mg Oral QHS  ? ?Continuous: ? cefTRIAXone (ROCEPHIN)  IV 1 g (01/12/22 2325)  ? vancomycin    ? ?[id number] **OR** acetaminophen, HYDROcodone-acetaminophen, morphine injection, ondansetron **OR** ondansetron (ZOFRAN) IV ? ?Antibiotics: ?Anti-infectives (From admission, onward)  ? ? Start     Dose/Rate Route Frequency Ordered Stop  ? 01/13/22 2000  vancomycin (VANCOREADY) IVPB 1750 mg/350 mL       ? 1,750 mg ?175 mL/hr over 120 Minutes Intravenous Every 24 hours 01/12/22 2242    ? 01/12/22 2330  vancomycin (VANCOREADY) IVPB 1500 mg/300 mL       ? 1,500 mg ?150 mL/hr over 120 Minutes Intravenous  Once 01/12/22 2231 01/13/22 0253  ? 01/12/22 2215  cefTRIAXone (ROCEPHIN) 1 g in sodium chloride 0.9 % 100 mL IVPB       ? 1 g ?200 mL/hr over 30 Minutes Intravenous Every 24 hours 01/12/22 2203    ? 01/12/22 1900  vancomycin (VANCOCIN) IVPB 1000 mg/200 mL premix       ? 1,000 mg ?200 mL/hr over 60 Minutes Intravenous  Once 01/12/22 1854 01/12/22  2137  ? ?  ? ? ?Objective: ? ?Vital Signs ? ?Vitals:  ? 01/12/22 2222 01/13/22 0013 01/13/22 0356 01/13/22 0739  ?BP:  104/67 115/72 124/71  ?Pulse:  77 77 72  ?Resp:  14 15 18   ?Temp:  97.8 ?F (36.6 ?C) 97.9 ?F (36.6 ?C) 97.8 ?F (36.6 ?C)  ?TempSrc:      ?SpO2:  96% 95% 97%  ?Weight: 102.7 kg     ?Height: 6\' 2"  (1.88 m)     ? ? ?Intake/Output Summary (Last 24 hours) at 01/13/2022 1051 ?Last data filed at 01/13/2022 0345 ?Gross per 24 hour  ?Intake 785.05 ml  ?Output --  ?Net 785.05 ml   ? ?Filed Weights  ? 01/12/22 2222  ?Weight: 102.7 kg  ? ? ?General appearance: Awake alert.  In no distress ?Resp: Clear to auscultation bilaterally.  Normal effort ?Cardio: S1-S2 is normal regular.  No S3-S4.  No rubs murmurs or bruit ?GI: Abdomen is soft.  Nontender nondistended.  Bowel sounds are present normal.  No masses organomegaly ?Extremities: Erythema noted over the left distal foot. ?Neurologic: Alert and oriented x3.  No focal neurological deficits.  ? ? ?Lab Results: ? ?Data Reviewed: I have personally reviewed labs and imaging study reports ? ?CBC: ?Recent Labs  ?Lab 01/12/22 ?1643  ?WBC 8.4  ?NEUTROABS 5.0  ?HGB 13.8  ?HCT 43.5  ?MCV 90.4  ?PLT 307  ? ? ?Basic Metabolic Panel: ?Recent Labs  ?Lab 01/12/22 ?1643  ?NA 135  ?K 4.1  ?CL 98  ?CO2 27  ?GLUCOSE 115*  ?BUN 22  ?CREATININE 1.50*  ?CALCIUM 9.5  ? ? ?GFR: ?Estimated Creatinine Clearance: 61.1 mL/min (A) (by C-G formula based on SCr of 1.5 mg/dL (H)). ? ?Liver Function Tests: ?Recent Labs  ?Lab 01/12/22 ?1643  ?AST 22  ?ALT 15  ?ALKPHOS 79  ?BILITOT 0.7  ?PROT 8.2*  ?ALBUMIN 3.7  ? ? ? ?CBG: ?Recent Labs  ?Lab 01/12/22 ?2316 01/12/22 ?[id number] 01/13/22 ?[id number] 01/13/22 ?[id number]  ?GLUCAP 134* 122* 157* 142*  ? ? ? ?Recent Results (from the past 240 hour(s))  ?Blood culture (routine x 2)     Status: None (Preliminary result)  ? Collection Time: 01/12/22  4:43 PM  ? Specimen: BLOOD  ?Result Value Ref Range Status  ? Specimen Description BLOOD LAC  Final  ? Special Requests BOTTLES DRAWN AEROBIC AND ANAEROBIC BCAV  Final  ? Culture   Final  ?  NO GROWTH < 12 HOURS ?Performed at [facility], [address] [place], [place] [address] ?  ? Report Status PENDING  Incomplete  ?Blood culture (routine x 2)     Status: None (Preliminary result)  ? Collection Time: 01/12/22  8:13 PM  ? Specimen: BLOOD  ?Result Value Ref Range Status  ? Specimen Description BLOOD LEFT ANTECUBITAL  Final  ? Special Requests   Final  ?  BOTTLES DRAWN AEROBIC AND ANAEROBIC  Blood Culture adequate volume  ? Culture   Final  ?  NO GROWTH < 12 HOURS ?Performed at [facility], [address] [place], [place] [address] ?  ? Report Status PENDING  Incomplete  ?  ? ? ?Radiology Studies: ?No results found. ? ? ? ? LOS: 1 day  ? ?Bonnielee Haff ? ?[facility] ?Pager on [email or web address] ? ?01/13/2022, 10:51 AM ? ? ?

## 2022-01-13 NOTE — Progress Notes (Signed)
Patient ID: [name], male   DOB: [birth year], 66 y.o.   MRN: [id number] ?Due to scheduling conflict we will cancel surgery for tonight and plan for surgery tomorrow around 3:00 PM.  Diet ordered and patient will then be n.p.o. after midnight tonight ?

## 2022-01-13 NOTE — H&P (View-Only) (Signed)
? ?  Subjective/Chief Complaint: ?Patient seen.  No complaints. ? ? ?Objective: ?Vital signs in last 24 hours: ?Temp:  [97.7 ?F (36.5 ?C)-98 ?F (36.7 ?C)] 97.7 ?F (36.5 ?C) (03/16 1144) ?Pulse Rate:  [72-99] 73 (03/16 1144) ?Resp:  [14-18] 18 (03/16 1144) ?BP: (104-126)/(67-85) 123/83 (03/16 1144) ?SpO2:  [95 %-100 %] 98 % (03/16 1144) ?Weight:  [102.7 kg] 102.7 kg (03/15 2222) ?Last BM Date : 01/12/22 ? ?Intake/Output from previous day: ?03/15 0701 - 03/16 0700 ?In: 785.1 [I.V.:185.8; IV Piggyback:599.2] ?Out: -  ?Intake/Output this shift: ?No intake/output data recorded. ? ?Bandages dry and intact on the left foot.  Patient seen outpatient in the clinic yesterday with full-thickness ulceration on the left hallux amputation stump probing down to the level of the bone with x-ray changes consistent with osteomyelitis.  Pulses are diminished but palpable.  Loss of sensation distally in the forefoot and toes. ? ? ?Lab Results:  ?Recent Labs  ?  01/12/22 ?1643  ?WBC 8.4  ?HGB 13.8  ?HCT 43.5  ?PLT 307  ? ?BMET ?Recent Labs  ?  01/12/22 ?1643  ?NA 135  ?K 4.1  ?CL 98  ?CO2 27  ?GLUCOSE 115*  ?BUN 22  ?CREATININE 1.50*  ?CALCIUM 9.5  ? ?PT/INR ?No results for input(s): LABPROT, INR in the last 72 hours. ?ABG ?No results for input(s): PHART, HCO3 in the last 72 hours. ? ?Invalid input(s): PCO2, PO2 ? ?Studies/Results: ?No results found. ? ?Anti-infectives: ?Anti-infectives (From admission, onward)  ? ? Start     Dose/Rate Route Frequency Ordered Stop  ? 01/13/22 2000  vancomycin (VANCOREADY) IVPB 1750 mg/350 mL       ? 1,750 mg ?175 mL/hr over 120 Minutes Intravenous Every 24 hours 01/12/22 2242    ? 01/12/22 2330  vancomycin (VANCOREADY) IVPB 1500 mg/300 mL       ? 1,500 mg ?150 mL/hr over 120 Minutes Intravenous  Once 01/12/22 2231 01/13/22 0253  ? 01/12/22 2215  cefTRIAXone (ROCEPHIN) 1 g in sodium chloride 0.9 % 100 mL IVPB       ? 1 g ?200 mL/hr over 30 Minutes Intravenous Every 24 hours 01/12/22 2203    ? 01/12/22  1900  vancomycin (VANCOCIN) IVPB 1000 mg/200 mL premix       ? 1,000 mg ?200 mL/hr over 60 Minutes Intravenous  Once 01/12/22 1854 01/12/22 2137  ? ?  ? ? ?Assessment/Plan: ?s/p Procedure(s): ?AMPUTATION TOE (Left) ?Assessment: 1.  Osteomyelitis left hallux. 2.  Diabetes with neuropathy. ? ?Plan: Discussed with the patient surgery planned for this afternoon for revision amputation of the left great toe with possible partial ray resection if needed.  Discussed risks and complications of the procedure including but not limited to inability of the wound to heal due to his diabetes or circulation or continued infection.  No guarantees could be given as to the outcome.  Questions invited and answered.  Consent form for amputation with partial ray resection left hallux.  Patient is currently NPO.  Plan for surgery later this afternoon. ? LOS: 1 day  ? ? ?Durward Fortes ?01/13/2022 ? ?

## 2022-01-14 ENCOUNTER — Encounter: Payer: Self-pay | Admitting: Internal Medicine

## 2022-01-14 ENCOUNTER — Encounter
Admission: EM | Disposition: A | Discharge status: Home / Self Care | Payer: Self-pay | Source: Ambulatory Visit | Attending: Internal Medicine

## 2022-01-14 ENCOUNTER — Inpatient Hospital Stay: Payer: Medicare Other | Admitting: Anesthesiology

## 2022-01-14 ENCOUNTER — Other Ambulatory Visit: Payer: Self-pay

## 2022-01-14 DIAGNOSIS — N1831 Chronic kidney disease, stage 3a: Secondary | ICD-10-CM | POA: Diagnosis not present

## 2022-01-14 DIAGNOSIS — M869 Osteomyelitis, unspecified: Secondary | ICD-10-CM | POA: Diagnosis not present

## 2022-01-14 DIAGNOSIS — E119 Type 2 diabetes mellitus without complications: Secondary | ICD-10-CM | POA: Diagnosis not present

## 2022-01-14 DIAGNOSIS — L0390 Cellulitis, unspecified: Secondary | ICD-10-CM | POA: Diagnosis not present

## 2022-01-14 HISTORY — PX: AMPUTATION TOE: SHX6595

## 2022-01-14 LAB — CBC
HCT: 39.5 % (ref 39.0–52.0)
Hemoglobin: 12.4 g/dL — ABNORMAL LOW (ref 13.0–17.0)
MCH: 28.5 pg (ref 26.0–34.0)
MCHC: 31.4 g/dL (ref 30.0–36.0)
MCV: 90.8 fL (ref 80.0–100.0)
Platelets: 261 10*3/uL (ref 150–400)
RBC: 4.35 MIL/uL (ref 4.22–5.81)
RDW: 15.2 % (ref 11.5–15.5)
WBC: 7.5 10*3/uL (ref 4.0–10.5)
nRBC: 0 % (ref 0.0–0.2)

## 2022-01-14 LAB — BASIC METABOLIC PANEL
Anion gap: 11 (ref 5–15)
BUN: 30 mg/dL — ABNORMAL HIGH (ref 8–23)
CO2: 26 mmol/L (ref 22–32)
Calcium: 9.1 mg/dL (ref 8.9–10.3)
Chloride: 101 mmol/L (ref 98–111)
Creatinine, Ser: 1.8 mg/dL — ABNORMAL HIGH (ref 0.61–1.24)
GFR, Estimated: 41 mL/min — ABNORMAL LOW (ref >60)
Glucose, Bld: 142 mg/dL — ABNORMAL HIGH (ref 70–99)
Potassium: 4.1 mmol/L (ref 3.5–5.1)
Sodium: 138 mmol/L (ref 135–145)

## 2022-01-14 LAB — GLUCOSE, CAPILLARY
Glucose-Capillary: 121 mg/dL — ABNORMAL HIGH (ref 70–99)
Glucose-Capillary: 144 mg/dL — ABNORMAL HIGH (ref 70–99)
Glucose-Capillary: 156 mg/dL — ABNORMAL HIGH (ref 70–99)
Glucose-Capillary: 159 mg/dL — ABNORMAL HIGH (ref 70–99)
Glucose-Capillary: 173 mg/dL — ABNORMAL HIGH (ref 70–99)
Glucose-Capillary: 92 mg/dL (ref 70–99)
Glucose-Capillary: 98 mg/dL (ref 70–99)

## 2022-01-14 SURGERY — AMPUTATION, TOE
Anesthesia: Monitor Anesthesia Care | Site: Toe | Laterality: Left

## 2022-01-14 MED ORDER — NEOMYCIN-POLYMYXIN B GU 40-200000 IR SOLN
Status: AC
  Filled 2022-01-14: qty 4

## 2022-01-14 MED ORDER — FENTANYL CITRATE (PF) 100 MCG/2ML IJ SOLN
INTRAMUSCULAR | Status: AC
Start: 2022-01-14 — End: ?
  Filled 2022-01-14: qty 2

## 2022-01-14 MED ORDER — PROPOFOL 10 MG/ML IV BOLUS
INTRAVENOUS | Status: AC
  Filled 2022-01-14: qty 20

## 2022-01-14 MED ORDER — BUPIVACAINE HCL 0.5 % IJ SOLN
INTRAMUSCULAR | Status: DC | PRN
  Administered 2022-01-14: 9 mL

## 2022-01-14 MED ORDER — SODIUM CHLORIDE 0.9 % IV SOLN: Freq: Once | INTRAVENOUS | Status: AC

## 2022-01-14 MED ORDER — LIDOCAINE HCL (PF) 2 % IJ SOLN
INTRAMUSCULAR | Status: AC
  Filled 2022-01-14: qty 5

## 2022-01-14 MED ORDER — OXYCODONE HCL 5 MG/5ML PO SOLN: 5.0000 mg | Freq: Once | ORAL | Status: DC | PRN

## 2022-01-14 MED ORDER — SODIUM CHLORIDE 0.45 % IV SOLN
INTRAVENOUS | Status: DC
Start: 2022-01-14 — End: 2022-01-15

## 2022-01-14 MED ORDER — SODIUM CHLORIDE 0.9 % IV SOLN: INTRAVENOUS | Status: DC | PRN

## 2022-01-14 MED ORDER — ONDANSETRON HCL 4 MG/2ML IJ SOLN: 4.0000 mg | Freq: Once | INTRAMUSCULAR | Status: DC | PRN

## 2022-01-14 MED ORDER — PROPOFOL 10 MG/ML IV BOLUS
INTRAVENOUS | Status: DC | PRN
  Administered 2022-01-14: 80 mg via INTRAVENOUS

## 2022-01-14 MED ORDER — DEXMEDETOMIDINE HCL IN NACL 200 MCG/50ML IV SOLN
INTRAVENOUS | Status: DC | PRN
  Administered 2022-01-14: 8 ug via INTRAVENOUS
  Administered 2022-01-14: 4 ug via INTRAVENOUS

## 2022-01-14 MED ORDER — SODIUM CHLORIDE 0.9 % IR SOLN
Status: DC | PRN
  Administered 2022-01-14: 1004 mL

## 2022-01-14 MED ORDER — OXYCODONE HCL 5 MG PO TABS: 5.0000 mg | ORAL_TABLET | Freq: Once | ORAL | Status: DC | PRN

## 2022-01-14 MED ORDER — SODIUM CHLORIDE 0.9% FLUSH: 3.0000 mL | INTRAVENOUS | Status: DC | PRN

## 2022-01-14 MED ORDER — PROPOFOL 500 MG/50ML IV EMUL
INTRAVENOUS | Status: DC | PRN
Start: 2022-01-14 — End: 2022-01-14
  Administered 2022-01-14: 50 ug/kg/min via INTRAVENOUS

## 2022-01-14 MED ORDER — SODIUM CHLORIDE 0.9% FLUSH
3.0000 mL | Freq: Two times a day (BID) | INTRAVENOUS | Status: DC
  Administered 2022-01-14 – 2022-01-16 (×4): 3 mL via INTRAVENOUS

## 2022-01-14 MED ORDER — FENTANYL CITRATE (PF) 100 MCG/2ML IJ SOLN
INTRAMUSCULAR | Status: DC | PRN
  Administered 2022-01-14: 50 ug via INTRAVENOUS

## 2022-01-14 MED ORDER — SODIUM CHLORIDE FLUSH 0.9 % IV SOLN
INTRAVENOUS | Status: AC
  Filled 2022-01-14: qty 10

## 2022-01-14 MED ORDER — EPHEDRINE 5 MG/ML INJ
INTRAVENOUS | Status: AC
  Filled 2022-01-14: qty 5

## 2022-01-14 MED ORDER — SODIUM CHLORIDE 0.9 % IV SOLN
250.0000 mL | INTRAVENOUS | Status: DC | PRN
  Administered 2022-01-15: 250 mL via INTRAVENOUS

## 2022-01-14 MED ORDER — EPHEDRINE SULFATE (PRESSORS) 50 MG/ML IJ SOLN
INTRAMUSCULAR | Status: DC | PRN
  Administered 2022-01-14: 15 mg via INTRAVENOUS
  Administered 2022-01-14: 10 mg via INTRAVENOUS

## 2022-01-14 MED ORDER — FENTANYL CITRATE (PF) 100 MCG/2ML IJ SOLN: 25.0000 ug | INTRAMUSCULAR | Status: DC | PRN

## 2022-01-14 MED ORDER — SODIUM CHLORIDE 0.9 % IV SOLN
2.0000 g | INTRAVENOUS | Status: DC
  Administered 2022-01-14 – 2022-01-15 (×2): 2 g via INTRAVENOUS
  Filled 2022-01-14 (×2): qty 20

## 2022-01-14 MED ORDER — BUPIVACAINE HCL (PF) 0.5 % IJ SOLN
INTRAMUSCULAR | Status: AC
Start: 2022-01-14 — End: ?
  Filled 2022-01-14: qty 30

## 2022-01-14 SURGICAL SUPPLY — 48 items
BLADE MED AGGRESSIVE (BLADE) ×1 IMPLANT
BLADE OSC/SAGITTAL MD 5.5X18 (BLADE) ×1 IMPLANT
BLADE SURG 15 STRL LF DISP TIS (BLADE) ×2 IMPLANT
BLADE SURG 15 STRL SS (BLADE) ×2
BLADE SURG MINI STRL (BLADE) ×2 IMPLANT
BNDG CONFORM 2 STRL LF (GAUZE/BANDAGES/DRESSINGS) ×2 IMPLANT
BNDG CONFORM 3 STRL LF (GAUZE/BANDAGES/DRESSINGS) ×2 IMPLANT
BNDG ELASTIC 4X5.8 VLCR STR LF (GAUZE/BANDAGES/DRESSINGS) ×2 IMPLANT
BNDG ESMARK 4X12 TAN STRL LF (GAUZE/BANDAGES/DRESSINGS) ×2 IMPLANT
BNDG GAUZE ELAST 4 BULKY (GAUZE/BANDAGES/DRESSINGS) ×2 IMPLANT
CUFF TOURN SGL QUICK 12 (TOURNIQUET CUFF) ×1 IMPLANT
CUFF TOURN SGL QUICK 18X4 (TOURNIQUET CUFF) ×1 IMPLANT
DRAPE FLUOR MINI C-ARM 54X84 (DRAPES) ×2 IMPLANT
DURAPREP 26ML APPLICATOR (WOUND CARE) ×2 IMPLANT
ELECT REM PT RETURN 9FT ADLT (ELECTROSURGICAL) ×2
ELECTRODE REM PT RTRN 9FT ADLT (ELECTROSURGICAL) ×1 IMPLANT
GAUZE SPONGE 4X4 12PLY STRL (GAUZE/BANDAGES/DRESSINGS) ×2 IMPLANT
GAUZE XEROFORM 1X8 LF (GAUZE/BANDAGES/DRESSINGS) ×2 IMPLANT
GLOVE SURG ENC MOIS LTX SZ7.5 (GLOVE) ×2 IMPLANT
GLOVE SURG UNDER LTX SZ8 (GLOVE) ×2 IMPLANT
GOWN STRL REUS W/ TWL LRG LVL3 (GOWN DISPOSABLE) ×2 IMPLANT
GOWN STRL REUS W/TWL LRG LVL3 (GOWN DISPOSABLE) ×2
HANDPIECE VERSAJET DEBRIDEMENT (MISCELLANEOUS) ×2 IMPLANT
IV NS IRRIG 3000ML ARTHROMATIC (IV SOLUTION) ×2 IMPLANT
KIT TURNOVER KIT A (KITS) ×2 IMPLANT
LABEL OR SOLS (LABEL) ×2 IMPLANT
MANIFOLD NEPTUNE II (INSTRUMENTS) ×2 IMPLANT
NDL FILTER BLUNT 18X1 1/2 (NEEDLE) ×1 IMPLANT
NDL HYPO 25X1 1.5 SAFETY (NEEDLE) ×2 IMPLANT
NEEDLE FILTER BLUNT 18X 1/2SAF (NEEDLE) ×1
NEEDLE FILTER BLUNT 18X1 1/2 (NEEDLE) ×1 IMPLANT
NEEDLE HYPO 25X1 1.5 SAFETY (NEEDLE) ×4 IMPLANT
NS IRRIG 500ML POUR BTL (IV SOLUTION) ×2 IMPLANT
PACK EXTREMITY ARMC (MISCELLANEOUS) ×2 IMPLANT
PAD ABD DERMACEA PRESS 5X9 (GAUZE/BANDAGES/DRESSINGS) ×4 IMPLANT
SOL PREP PVP 2OZ (MISCELLANEOUS) ×2
SOLUTION PREP PVP 2OZ (MISCELLANEOUS) ×1 IMPLANT
STOCKINETTE BIAS CUT 4 980044 (GAUZE/BANDAGES/DRESSINGS) ×2 IMPLANT
STOCKINETTE STRL 6IN 960660 (GAUZE/BANDAGES/DRESSINGS) ×2 IMPLANT
STRIP CLOSURE SKIN 1/4X4 (GAUZE/BANDAGES/DRESSINGS) ×2 IMPLANT
SUT ETHILON 3-0 FS-10 30 BLK (SUTURE) ×2
SUT ETHILON 4-0 (SUTURE)
SUT ETHILON 4-0 FS2 18XMFL BLK (SUTURE)
SUTURE EHLN 3-0 FS-10 30 BLK (SUTURE) ×1 IMPLANT
SUTURE ETHLN 4-0 FS2 18XMF BLK (SUTURE) IMPLANT
SWAB CULTURE AMIES ANAERIB BLU (MISCELLANEOUS) ×2 IMPLANT
SYR 10ML LL (SYRINGE) ×2 IMPLANT
WATER STERILE IRR 500ML POUR (IV SOLUTION) ×2 IMPLANT

## 2022-01-14 NOTE — Anesthesia Preprocedure Evaluation (Signed)
Anesthesia Evaluation  ? ? ?Reviewed: ?Allergy & Precautions, NPO status , Patient's Chart, lab work & pertinent test results, reviewed documented beta blocker date and time , Unable to perform ROS - Chart review only ? ?Airway ?Mallampati: II ? ?TM Distance: >3 FB ?Neck ROM: full ? ? ? Dental ?no notable dental hx. ?(+) Teeth Intact ?  ?Pulmonary ?neg pulmonary ROS,  ?  ?Pulmonary exam normal ?breath sounds clear to auscultation ? ? ? ? ? ? Cardiovascular ?Exercise Tolerance: Good ?hypertension, negative cardio ROS ? ? ?Rhythm:regular Rate:Normal ? ? ?  ?Neuro/Psych ?negative neurological ROS ? negative psych ROS  ? GI/Hepatic ?negative GI ROS, Neg liver ROS,   ?Endo/Other  ?negative endocrine ROSdiabetes ? Renal/GU ?Renal disease  ? ?  ?Musculoskeletal ? ? Abdominal ?  ?Peds ? Hematology ?negative hematology ROS ?(+)   ?Anesthesia Other Findings ? ? Reproductive/Obstetrics ?negative OB ROS ? ?  ? ? ? ? ? ? ? ? ? ? ? ? ? ?  ?  ? ? ? ? ? ? ? ? ?Anesthesia Physical ?Anesthesia Plan ? ?ASA: 2 ? ?Anesthesia Plan: MAC  ? ?Post-op Pain Management:   ? ?Induction:  ? ?PONV Risk Score and Plan:  ? ?Airway Management Planned:  ? ?Additional Equipment:  ? ?Intra-op Plan:  ? ?Post-operative Plan:  ? ?Informed Consent: I have reviewed the patients History and Physical, chart, labs and discussed the procedure including the risks, benefits and alternatives for the proposed anesthesia with the patient or authorized representative who has indicated his/her understanding and acceptance.  ? ? ? ? ? ?Plan Discussed with: CRNA ? ?Anesthesia Plan Comments:   ? ? ? ? ? ? ?Anesthesia Quick Evaluation ? ?

## 2022-01-14 NOTE — Progress Notes (Signed)
Pharmacy Antibiotic Note ? ?[name] is a 66 y.o. male admitted on 01/12/2022 with  osteomyelitis .  Pharmacy has been consulted for Vancomycin dosing. ? ?Plan: ?Vancomycin total loading dose of 2500 mg, followed by vancomycin 1750 mg IV q24h ?Goal AUC 400-550  ?Est AUC: 552.6 ?Est Cmax: 36.8 ?Est Cmin: 14.4 ?Calculated with SCr 1.8 (increased from 1.5 on 3/16) ? ?Pt also ordered ceftriaxone 1 g IV q24h, will increase to 2 g for indication of osteomyelitis  ? ?Height: 6\' 2"  (188 cm) ?Weight: 102.7 kg (226 lb 8 oz) ?IBW/kg (Calculated) : 82.2 ? ?Temp (24hrs), Avg:98 ?F (36.7 ?C), Min:97.9 ?F (36.6 ?C), Max:98.2 ?F (36.8 ?C) ? ?Recent Labs  ?Lab 01/12/22 ?1643 01/12/22 ?2015 01/14/22 ?0352  ?WBC 8.4  --  7.5  ?CREATININE 1.50*  --  1.80*  ?LATICACIDVEN 2.1* 2.1*  --   ? ?  ?Estimated Creatinine Clearance: 50.9 mL/min (A) (by C-G formula based on SCr of 1.8 mg/dL (H)).   ? ?No Known Allergies ? ?Antimicrobials this admission: ?Vancomycin 3/15 >>  ?Ceftriaxone 3/15 >>  ? ?Dose adjustments this admission: ?3/17 ceftriaxone 1 g q24h >> 2 g q24h  ?3/17 vancomycin 1750 mg IV  ? ?Microbiology results: ?3/17 BCx: NG x 2 days  ? ?Thank you for allowing pharmacy to be a part of this patient?s care. ? ?4/17, PharmD ?01/14/2022 1:18 PM ? ?

## 2022-01-14 NOTE — Op Note (Signed)
Date of operation: 01/14/2022. ? ?Surgeon: Durward Fortes D.P.M. ? ?Preoperative diagnosis: Osteomyelitis left hallux. ? ?Postoperative diagnosis: Same. ? ?Procedure: Amputation left hallux. ? ?Anesthesia: Local MAC. ? ?Hemostasis: Esmarch tourniquet left ankle. ? ?Estimated blood loss: Less than 5 cc. ? ?Materials: None. ? ?Pathology: Left great toe. ? ?Cultures: Bone cultures proximal phalanx left hallux. ? ?Complications: None apparent. ? ?Operative indications: This is a 66 year old diabetic male with recent partial amputation of the left great toe.  Developed postoperative wound complications and ulceration with subsequent osteomyelitis in the remainder of the proximal phalanx and decision was made for readmission and completion of amputation of the left hallux. ? ?Operative procedure: Patient was taken to the operating room and placed on the table in the supine position.  Following satisfactory sedation the left foot was anesthetized with 9 cc of 0.5% Marcaine plain around the first metatarsal.  The foot was prepped and draped in usual sterile fashion.  The foot was exsanguinated using an Esmarch and left in place around the ankle to act as a tourniquet.  Attention was then directed to the left foot to the left hallux amputation stump site where a fishmouth type incision was made from medial to lateral just proximal to the plantar ulceration.  Incision carried sharply down to the level of the bone with dissection carried back proximally to the metatarsal phalangeal joint where the toe was disarticulated and removed.  Sample of bone taken for cultures and toe passed off for pathology.  The wound was then thoroughly irrigated using a Versajet debrider on a setting of 4 and then flushed with copious amounts of sterile saline.  Incision was closed using 3-0 nylon simple interrupted sutures.  Xeroform 4 x 4's and Kerlix applied to the left foot.  Tourniquet was released and ABD Kerlix and Ace wrap applied to the left  lower extremity.  Patient was awakened and transported to the PACU having tolerated the anesthesia and procedure well. ?

## 2022-01-14 NOTE — Plan of Care (Signed)

## 2022-01-14 NOTE — Transfer of Care (Signed)
Immediate Anesthesia Transfer of Care Note ? ?Patient: [name] ? ?Procedure(s) Performed: GREAT TOE AMPUTATION (Left: Toe) ? ?Patient Location: PACU ? ?Anesthesia Type:General ? ?Level of Consciousness: drowsy and patient cooperative ? ?Airway & Oxygen Therapy: Patient Spontanous Breathing ? ?Post-op Assessment: Report given to RN and Post -op Vital signs reviewed and stable ? ?Post vital signs: Reviewed and stable ? ?Last Vitals:  ?Vitals Value Taken Time  ?BP 99/60 01/14/22 1610  ?Temp 36.1 ?C 01/14/22 1607  ?Pulse 67 01/14/22 1613  ?Resp 14 01/14/22 1613  ?SpO2 94 % 01/14/22 1613  ?Vitals shown include unvalidated device data. ? ?Last Pain:  ?Vitals:  ? 01/14/22 1417  ?TempSrc: Temporal  ?PainSc: 5   ?   ? ?Patients Stated Pain Goal: 0 (01/14/22 1000) ? ?Complications: No notable events documented. ?

## 2022-01-14 NOTE — Care Management Important Message (Signed)
Important Message ? ?Patient Details  ?Name: [name] ?MRN: [id number] ?Date of Birth: [birth year] ? ? ?Medicare Important Message Given:  N/A - LOS <3 / Initial given by admissions ? ? ? ? ?Olegario Messier A Boss Danielsen ?01/14/2022, 9:18 AM ?

## 2022-01-14 NOTE — Progress Notes (Signed)
? ?[facility] ?PROGRESS NOTE ? ? ?[name] [id number] DOB: Jul 19, 1956 DOA: 01/12/2022  2 ?DOS: the patient was seen and examined on 01/14/2022 ? ?PCP: Peggye Form, NP ? ?Brief History and Hospital Course:  ?66 y.o. male with medical history significant for DM, HTN, gout, depression, CKD 3A, left hemiparesis secondary to CVA, s/p partial amputation of the left great toe 2/15 secondary to osteomyelitis, with non healing plantar wound on the left foot ,on antibiotics for the past month who presented to the ED from podiatry due to concerns for osteomyelitis of the left great toe stump with plans for further amputation of the entire left toe.  Patient was started on vancomycin and hospitalized for further management. ? ?Consultants: Podiatry ? ?Procedures: None ? ? ? ?Subjective: ?Patient asleep this morning but easily arousable.  Denies any discomfort in his foot.  No chest pain shortness of breath.  Has been urinating.   ? ?Assessment/Plan: ? ? ?* Left hallux osteomyelitis ([place]) ?S/p partial amputation left great toe on 2/15.  Followed by podiatry.  Concern for osteomyelitis.   ?Patient was started on vancomycin ceftriaxone.  Could not undergo surgery yesterday due to scheduling issues.  Plan is for surgery this afternoon. ? ?Cellulitis of great toe of left foot ?See discussion under osteomyelitis.  Continue antibiotics.  ? ?Lactic acidosis ?Lactic acid level was minimally elevated but no ongoing concern for sepsis at this time.   ? ?Diabetes mellitus type 2, uncomplicated ([place]) ?Home medications on hold currently.  Continue SSI.  Monitor CBGs.   ? ?Stage 3a chronic kidney disease ([place]) ?AKI ? ?Previous labs reviewed.  His creatinine was 2.25 on 2/6.  Stabilized around 1.37. ?Presented with creatinine of 1.5.  Noted to be 1.8 today.  Likely due to poor oral intake from n.p.o. status.  We will gently hydrate him for 24 hours and recheck labs tomorrow.  Avoid nephrotoxic agent. ? ?Hemiparesis  affecting left side as late effect of cerebrovascular accident (CVA) ([place]) ?Plavix on hold.  Aspirin and statin being continued. ? ?Depression ?Continue home medications. ? ?Gout ?Stable.  Continue home allopurinol. ? ? ? ? ?DVT Prophylaxis: None currently due to plans for surgery. ?Code Status: DNR ?Family Communication: Discussed with patient ?Disposition Plan: Hopefully return home when improved ? ?Status is: Inpatient ?Remains inpatient appropriate because: Concern for osteomyelitis of the left hallux.  IV antibiotics.  Need for surgical intervention. ? ? ? ? ?Medications: Scheduled: ? allopurinol  300 mg Oral Daily  ? aspirin  81 mg Oral Daily  ? atorvastatin  80 mg Oral Daily  ? gabapentin  900 mg Oral TID  ? insulin aspart  0-15 Units Subcutaneous Q4H  ? QUEtiapine  25 mg Oral QHS  ? ?Continuous: ? sodium chloride 75 mL/hr at 01/14/22 0858  ? cefTRIAXone (ROCEPHIN)  IV 1 g (01/13/22 2314)  ? vancomycin 1,750 mg (01/13/22 2057)  ? ?[id number] **OR** acetaminophen, HYDROcodone-acetaminophen, morphine injection, ondansetron **OR** ondansetron (ZOFRAN) IV ? ?Antibiotics: ?Anti-infectives (From admission, onward)  ? ? Start     Dose/Rate Route Frequency Ordered Stop  ? 01/13/22 2000  vancomycin (VANCOREADY) IVPB 1750 mg/350 mL       ? 1,750 mg ?175 mL/hr over 120 Minutes Intravenous Every 24 hours 01/12/22 2242    ? 01/12/22 2330  vancomycin (VANCOREADY) IVPB 1500 mg/300 mL       ? 1,500 mg ?150 mL/hr over 120 Minutes Intravenous  Once 01/12/22 2231 01/13/22 0253  ? 01/12/22 2215  cefTRIAXone (ROCEPHIN) 1  g in sodium chloride 0.9 % 100 mL IVPB       ? 1 g ?200 mL/hr over 30 Minutes Intravenous Every 24 hours 01/12/22 2203    ? 01/12/22 1900  vancomycin (VANCOCIN) IVPB 1000 mg/200 mL premix       ? 1,000 mg ?200 mL/hr over 60 Minutes Intravenous  Once 01/12/22 1854 01/12/22 2137  ? ?  ? ? ?Objective: ? ?Vital Signs ? ?Vitals:  ? 01/13/22 1551 01/13/22 2010 01/14/22 0400 01/14/22 0846  ?BP: 132/89 98/71 94/62   110/63  ?Pulse: 81 81 70 82  ?Resp: 18 20 20 16   ?Temp: 97.9 ?F (36.6 ?C) 98 ?F (36.7 ?C) 98 ?F (36.7 ?C) 98.2 ?F (36.8 ?C)  ?TempSrc:   Oral   ?SpO2: 100% 94% 95% 98%  ?Weight:      ?Height:      ? ? ?Intake/Output Summary (Last 24 hours) at 01/14/2022 1044 ?Last data filed at 01/13/2022 1845 ?Gross per 24 hour  ?Intake 240 ml  ?Output --  ?Net 240 ml  ? ? ?Filed Weights  ? 01/12/22 2222  ?Weight: 102.7 kg  ? ? ?General appearance: Awake alert.  In no distress ?Resp: Clear to auscultation bilaterally.  Normal effort ?Cardio: S1-S2 is normal regular.  No S3-S4.  No rubs murmurs or bruit ?GI: Abdomen is soft.  Nontender nondistended.  Bowel sounds are present normal.  No masses organomegaly ?Extremities: Erythema noted over the left distal foot. ?Neurologic: Alert and oriented x3.  No focal neurological deficits.  ? ? ? ?Lab Results: ? ?Data Reviewed: I have personally reviewed labs and imaging study reports ? ?CBC: ?Recent Labs  ?Lab 01/12/22 ?1643 01/14/22 ?0352  ?WBC 8.4 7.5  ?NEUTROABS 5.0  --   ?HGB 13.8 12.4*  ?HCT 43.5 39.5  ?MCV 90.4 90.8  ?PLT 307 261  ? ? ? ?Basic Metabolic Panel: ?Recent Labs  ?Lab 01/12/22 ?1643 01/14/22 ?0352  ?NA 135 138  ?K 4.1 4.1  ?CL 98 101  ?CO2 27 26  ?GLUCOSE 115* 142*  ?BUN 22 30*  ?CREATININE 1.50* 1.80*  ?CALCIUM 9.5 9.1  ? ? ? ?GFR: ?Estimated Creatinine Clearance: 50.9 mL/min (A) (by C-G formula based on SCr of 1.8 mg/dL (H)). ? ?Liver Function Tests: ?Recent Labs  ?Lab 01/12/22 ?1643  ?AST 22  ?ALT 15  ?ALKPHOS 79  ?BILITOT 0.7  ?PROT 8.2*  ?ALBUMIN 3.7  ? ? ? ? ?CBG: ?Recent Labs  ?Lab 01/13/22 ?1633 01/13/22 ?2043 01/14/22 ?[id number] 01/14/22 ?[id number] 01/14/22 ?[id number]  ?GLUCAP 159* 127* 144* 156* 159*  ? ? ? ? ?Recent Results (from the past 240 hour(s))  ?Blood culture (routine x 2)     Status: None (Preliminary result)  ? Collection Time: 01/12/22  4:43 PM  ? Specimen: BLOOD  ?Result Value Ref Range Status  ? Specimen Description BLOOD LAC  Final  ? Special Requests BOTTLES DRAWN  AEROBIC AND ANAEROBIC BCAV  Final  ? Culture   Final  ?  NO GROWTH 2 DAYS ?Performed at [facility], [address], [place], [place] [address] ?  ? Report Status PENDING  Incomplete  ?Blood culture (routine x 2)     Status: None (Preliminary result)  ? Collection Time: 01/12/22  8:13 PM  ? Specimen: BLOOD  ?Result Value Ref Range Status  ? Specimen Description BLOOD LEFT ANTECUBITAL  Final  ? Special Requests   Final  ?  BOTTLES DRAWN AEROBIC AND ANAEROBIC Blood Culture adequate volume  ? Culture  Final  ?  NO GROWTH 2 DAYS ?Performed at [facility], [address] [place], [place] [address] ?  ? Report Status PENDING  Incomplete  ? ?  ? ? ?Radiology Studies: ?No results found. ? ? ? ? LOS: 2 days  ? ?Bonnielee Haff ? ?[facility] ?Pager on [email or web address] ? ?01/14/2022, 10:44 AM ? ? ?

## 2022-01-14 NOTE — Interval H&P Note (Signed)
History and Physical Interval Note: ? ?01/14/2022 ?2:59 PM ? ?[name]  has presented today for surgery, with the diagnosis of oseomyelitis.  The various methods of treatment have been discussed with the patient and family. After consideration of risks, benefits and other options for treatment, the patient has consented to  Procedure(s): ?AMPUTATION TOE (Left) as a surgical intervention.  The patient's history has been reviewed, patient examined, no change in status, stable for surgery.  I have reviewed the patient's chart and labs.  Questions were answered to the patient's satisfaction.   ? ? ?Ricci Barker ? ? ?

## 2022-01-14 NOTE — Anesthesia Postprocedure Evaluation (Signed)
Anesthesia Post Note ? ?Patient: [name] ? ?Procedure(s) Performed: GREAT TOE AMPUTATION (Left: Toe) ? ?Patient location during evaluation: PACU ?Anesthesia Type: MAC ?Level of consciousness: awake and alert ?Pain management: pain level controlled ?Vital Signs Assessment: post-procedure vital signs reviewed and stable ?Respiratory status: spontaneous breathing, nonlabored ventilation, respiratory function stable and patient connected to nasal cannula oxygen ?Cardiovascular status: stable and blood pressure returned to baseline ?Postop Assessment: no apparent nausea or vomiting ?Anesthetic complications: no ? ? ?No notable events documented. ? ? ?Last Vitals:  ?Vitals:  ? 01/14/22 1640 01/14/22 1645  ?BP: 109/70 114/66  ?Pulse: 61 (!) 56  ?Resp: 15 12  ?Temp:    ?SpO2: 95% 95%  ?  ?Last Pain:  ?Vitals:  ? 01/14/22 1640  ?TempSrc:   ?PainSc: 0-No pain  ? ? ?  ?  ?  ?  ?  ?  ? ?Arita Miss ? ? ? ? ?

## 2022-01-15 ENCOUNTER — Encounter: Payer: Self-pay | Admitting: Podiatry

## 2022-01-15 LAB — CBC
HCT: 37.2 % — ABNORMAL LOW (ref 39.0–52.0)
Hemoglobin: 11.6 g/dL — ABNORMAL LOW (ref 13.0–17.0)
MCH: 28.7 pg (ref 26.0–34.0)
MCHC: 31.2 g/dL (ref 30.0–36.0)
MCV: 92.1 fL (ref 80.0–100.0)
Platelets: 247 10*3/uL (ref 150–400)
RBC: 4.04 MIL/uL — ABNORMAL LOW (ref 4.22–5.81)
RDW: 15.4 % (ref 11.5–15.5)
WBC: 8.5 10*3/uL (ref 4.0–10.5)
nRBC: 0 % (ref 0.0–0.2)

## 2022-01-15 LAB — BASIC METABOLIC PANEL
Anion gap: 10 (ref 5–15)
BUN: 23 mg/dL (ref 8–23)
CO2: 23 mmol/L (ref 22–32)
Calcium: 8.8 mg/dL — ABNORMAL LOW (ref 8.9–10.3)
Chloride: 104 mmol/L (ref 98–111)
Creatinine, Ser: 1.24 mg/dL (ref 0.61–1.24)
GFR, Estimated: >60 mL/min (ref >60)
Glucose, Bld: 139 mg/dL — ABNORMAL HIGH (ref 70–99)
Potassium: 3.8 mmol/L (ref 3.5–5.1)
Sodium: 137 mmol/L (ref 135–145)

## 2022-01-15 LAB — GLUCOSE, CAPILLARY
Glucose-Capillary: 105 mg/dL — ABNORMAL HIGH (ref 70–99)
Glucose-Capillary: 108 mg/dL — ABNORMAL HIGH (ref 70–99)
Glucose-Capillary: 128 mg/dL — ABNORMAL HIGH (ref 70–99)
Glucose-Capillary: 135 mg/dL — ABNORMAL HIGH (ref 70–99)
Glucose-Capillary: 137 mg/dL — ABNORMAL HIGH (ref 70–99)
Glucose-Capillary: 142 mg/dL — ABNORMAL HIGH (ref 70–99)

## 2022-01-15 MED ORDER — VANCOMYCIN HCL IN DEXTROSE 1-5 GM/200ML-% IV SOLN
1000.0000 mg | Freq: Two times a day (BID) | INTRAVENOUS | Status: DC
  Administered 2022-01-15 – 2022-01-16 (×2): 1000 mg via INTRAVENOUS
  Filled 2022-01-15 (×4): qty 200

## 2022-01-15 NOTE — TOC CM/SW Note (Signed)
?  Transition of Care (TOC) Screening Note ? ? ?Patient Details  ?Name: [name] ?Date of Birth: 03-Nov-1955 ? ? ?Transition of Care (TOC) CM/SW Contact:    ?Tenee Wish E Dustan Hyams, LCSW ?Phone Number: ?01/15/2022, 9:53 AM ? ? ? ?Transition of Care Department [facility]) has reviewed patient and no TOC needs have been identified at this time. We will continue to monitor patient advancement through interdisciplinary progression rounds. If new patient transition needs arise, please place a TOC consult. ? ? ?

## 2022-01-15 NOTE — Progress Notes (Signed)
Pharmacy Antibiotic Note ? ?[name] is a 66 y.o. male admitted on 01/12/2022 with  osteomyelitis  now s/p amputation of the left hallux.  Pharmacy has been consulted for vancomycin dosing. Renal function has improved since admission ? ?Plan: adjust vancomycin dose to 1000 mg IV q12h ?Goal AUC 400-550  ?Est AUC: 449.4 ?Est Cmax: 26.3 ?Est Cmin: 13.6 ?Calculated with SCr 1.24 mg/dL ?Ke: 0.06 h-1, T1/2 11.5h ? ?Height: 6\' 2"  (188 cm) ?Weight: 102.7 kg (226 lb 6.6 oz) ?IBW/kg (Calculated) : 82.2 ? ?Temp (24hrs), Avg:97.9 ?F (36.6 ?C), Min:96.8 ?F (36 ?C), Max:98.7 ?F (37.1 ?C) ? ?Recent Labs  ?Lab 01/12/22 ?1643 01/12/22 ?2015 01/14/22 ?0352 01/15/22 ?0430  ?WBC 8.4  --  7.5 8.5  ?CREATININE 1.50*  --  1.80* 1.24  ?LATICACIDVEN 2.1* 2.1*  --   --   ? ?  ?Estimated Creatinine Clearance: 73.9 mL/min (by C-G formula based on SCr of 1.24 mg/dL).   ? ?No Known Allergies ? ?Antimicrobials this admission: ?vancomycin 3/15 >>  ?ceftriaxone 3/15 >>  ? ?Dose adjustments this admission: ?3/17 ceftriaxone 1 g q24h >> 2 g q24h  ?3/17 vancomycin 1750 mg IV q24h ?3/18  vancomycin 1000 mg IV q12h ? ?Microbiology results: ?3/17 BCx: NG x 3 days  ? ?Thank you for allowing pharmacy to be a part of this patient?s care. ? ?Dallie Piles, PharmD ?01/15/2022 10:21 AM ? ?

## 2022-01-15 NOTE — Progress Notes (Signed)
1 Day Post-Op  ? ?Subjective/Chief Complaint: ?Patient seen.  Some pain with the amputation site overnight.  Pain medication appears to be controlling his pain.  Relates some significant bleeding when he got up to walk to go to the bathroom.  Bandage had to be reinforced. ? ? ?Objective: ?Vital signs in last 24 hours: ?Temp:  [96.8 ?F (36 ?C)-98.7 ?F (37.1 ?C)] 98.7 ?F (37.1 ?C) (03/18 0751) ?Pulse Rate:  [56-84] 82 (03/18 0751) ?Resp:  [12-20] 16 (03/18 0751) ?BP: (94-114)/(60-73) 103/73 (03/18 0751) ?SpO2:  [92 %-100 %] 97 % (03/18 0751) ?Weight:  [102.7 kg] 102.7 kg (03/17 1417) ?Last BM Date : 01/12/22 ? ?Intake/Output from previous day: ?03/17 0701 - 03/18 0700 ?In: 1620 [P.O.:1020; I.V.:600] ?Out: 0  ?Intake/Output this shift: ?No intake/output data recorded. ? ?Heavy bleeding is noted on the bandaging with some strikethrough.  Upon removal the incision is well coapted with some mild active bleeding from the medial aspect.  Some mild bruising or possible early cyanotic changes noted on the dorsal flap towards the second toe.  No purulence noted. ? ? ?Lab Results:  ?Recent Labs  ?  01/14/22 ?0352 01/15/22 ?0430  ?WBC 7.5 8.5  ?HGB 12.4* 11.6*  ?HCT 39.5 37.2*  ?PLT 261 247  ? ?BMET ?Recent Labs  ?  01/14/22 ?0352 01/15/22 ?0430  ?NA 138 137  ?K 4.1 3.8  ?CL 101 104  ?CO2 26 23  ?GLUCOSE 142* 139*  ?BUN 30* 23  ?CREATININE 1.80* 1.24  ?CALCIUM 9.1 8.8*  ? ?PT/INR ?No results for input(s): LABPROT, INR in the last 72 hours. ?ABG ?No results for input(s): PHART, HCO3 in the last 72 hours. ? ?Invalid input(s): PCO2, PO2 ? ?Studies/Results: ?No results found. ? ?Anti-infectives: ?Anti-infectives (From admission, onward)  ? ? Start     Dose/Rate Route Frequency Ordered Stop  ? 01/15/22 1800  vancomycin (VANCOCIN) IVPB 1000 mg/200 mL premix       ? 1,000 mg ?200 mL/hr over 60 Minutes Intravenous Every 12 hours 01/15/22 1028    ? 01/14/22 2200  cefTRIAXone (ROCEPHIN) 2 g in sodium chloride 0.9 % 100 mL IVPB       ? 2  g ?200 mL/hr over 30 Minutes Intravenous Every 24 hours 01/14/22 1332    ? 01/13/22 2000  vancomycin (VANCOREADY) IVPB 1750 mg/350 mL  Status:  Discontinued       ? 1,750 mg ?175 mL/hr over 120 Minutes Intravenous Every 24 hours 01/12/22 2242 01/15/22 1028  ? 01/12/22 2330  vancomycin (VANCOREADY) IVPB 1500 mg/300 mL       ? 1,500 mg ?150 mL/hr over 120 Minutes Intravenous  Once 01/12/22 2231 01/13/22 0253  ? 01/12/22 2215  cefTRIAXone (ROCEPHIN) 1 g in sodium chloride 0.9 % 100 mL IVPB  Status:  Discontinued       ? 1 g ?200 mL/hr over 30 Minutes Intravenous Every 24 hours 01/12/22 2203 01/14/22 1332  ? 01/12/22 1900  vancomycin (VANCOCIN) IVPB 1000 mg/200 mL premix       ? 1,000 mg ?200 mL/hr over 60 Minutes Intravenous  Once 01/12/22 1854 01/12/22 2137  ? ?  ? ? ?Assessment/Plan: ?s/p Procedure(s): ?GREAT TOE AMPUTATION (Left) ?Assessment: Status post amputation left hallux. ? ?Plan: Betadine and Xeroform followed by a bulky gauze bandage with ABDs and Kerlix and Ace wrap reapplied to the left foot.  Instructed the patient on only walking on the heel when he gets up to go to the restroom.  Due to the significant  active bleeding as well as cyanotic changes on the dorsal flap we will keep him until tomorrow to reevaluate the wound tomorrow.  Discussed with the patient the possibility of having vascular surgery evaluate the patient but at this point think his circulation is probably okay with palpable pulses and the amount of bleeding he is having. ? LOS: 3 days  ? ? ?[name] ?01/15/2022 ? ?

## 2022-01-15 NOTE — Progress Notes (Signed)
? ?[facility] ?PROGRESS NOTE ? ? ?Julyen Nyarko [id number] DOB: Sep 08, 1956 DOA: 01/12/2022  3 ?DOS: the patient was seen and examined on 01/15/2022 ? ?PCP: Peggye Form, NP ? ?Brief History and Hospital Course:  ?66 y.o. male with medical history significant for DM, HTN, gout, depression, CKD 3A, left hemiparesis secondary to CVA, s/p partial amputation of the left great toe 2/15 secondary to osteomyelitis, with non healing plantar wound on the left foot ,on antibiotics for the past month who presented to the ED from podiatry due to concerns for osteomyelitis of the left great toe stump with plans for further amputation of the entire left toe.  Patient was started on vancomycin and hospitalized for further management.  Patient underwent amputation of the hallux on 3/17.  Some bleeding issues were noted overnight. ? ?Consultants: Podiatry ? ?Procedures: None ? ? ? ?Subjective: ?Feels well.  Pain is 7 out of 10 in intensity.  Had bleeding overnight after he ambulated to the bathroom.  No other complaints offered.   ? ? ? ?Assessment/Plan: ? ? ?* Left hallux osteomyelitis ([place]) ?S/p partial amputation left great toe on 2/15.  Followed by podiatry.  Concern for osteomyelitis.   ?Patient was started on vancomycin ceftriaxone.   ?Underwent amputation of the hallux on 3/17.  Bleeding issues overnight.  Globin stable.  Afebrile.  WBC is normal.  Podiatry wants to monitor for additional 24 hours.   ?We will also need to determine antibiotic plan at discharge. ? ?Cellulitis of great toe of left foot ?See discussion under osteomyelitis.  Continue antibiotics.  ? ?Lactic acidosis ?Lactic acid level was minimally elevated but no ongoing concern for sepsis at this time.   ? ?Diabetes mellitus type 2, uncomplicated ([place]) ?Home medications on hold currently.  Continue SSI.  Monitor CBGs.  CBGs are reasonably well controlled. ? ?Stage 3a chronic kidney disease ([place]) ?AKI ? ?Previous labs reviewed.  His creatinine was  2.25 on 2/6.  Stabilized around 1.37. ?Presented with creatinine of 1.5.  Noted to be 1.8 yesterday.  Was gently hydrated.  Improved to 1.24 today. ? ?Hemiparesis affecting left side as late effect of cerebrovascular accident (CVA) ([place]) ?Plavix on hold.  Aspirin and statin being continued. ?Reinitiation of Plavix per podiatry.  Would likely need to hold for now since he had some bleeding issues overnight. ? ?Depression ?Continue home medications. ? ?Gout ?Stable.  Continue home allopurinol. ? ? ? ? ?DVT Prophylaxis: Holding anticoagulation for now due to bleeding issues. ?Code Status: DNR ?Family Communication: Discussed with patient ?Disposition Plan: Hopefully return home when improved ? ?Status is: Inpatient ?Remains inpatient appropriate because: Concern for osteomyelitis of the left hallux.  IV antibiotics.  Need for surgical intervention. ? ? ? ? ?Medications: Scheduled: ? allopurinol  300 mg Oral Daily  ? aspirin  81 mg Oral Daily  ? atorvastatin  80 mg Oral Daily  ? gabapentin  900 mg Oral TID  ? insulin aspart  0-15 Units Subcutaneous Q4H  ? QUEtiapine  25 mg Oral QHS  ? sodium chloride flush  3 mL Intravenous Q12H  ? ?Continuous: ? sodium chloride    ? cefTRIAXone (ROCEPHIN)  IV 2 g (01/14/22 2249)  ? vancomycin    ? ?[id number] chloride, acetaminophen **OR** acetaminophen, HYDROcodone-acetaminophen, morphine injection, ondansetron **OR** ondansetron (ZOFRAN) IV, sodium chloride flush ? ?Antibiotics: ?Anti-infectives (From admission, onward)  ? ? Start     Dose/Rate Route Frequency Ordered Stop  ? 01/15/22 1800  vancomycin (VANCOCIN) IVPB 1000 mg/200 mL  premix       ? 1,000 mg ?200 mL/hr over 60 Minutes Intravenous Every 12 hours 01/15/22 1028    ? 01/14/22 2200  cefTRIAXone (ROCEPHIN) 2 g in sodium chloride 0.9 % 100 mL IVPB       ? 2 g ?200 mL/hr over 30 Minutes Intravenous Every 24 hours 01/14/22 1332    ? 01/13/22 2000  vancomycin (VANCOREADY) IVPB 1750 mg/350 mL  Status:  Discontinued       ? 1,750  mg ?175 mL/hr over 120 Minutes Intravenous Every 24 hours 01/12/22 2242 01/15/22 1028  ? 01/12/22 2330  vancomycin (VANCOREADY) IVPB 1500 mg/300 mL       ? 1,500 mg ?150 mL/hr over 120 Minutes Intravenous  Once 01/12/22 2231 01/13/22 0253  ? 01/12/22 2215  cefTRIAXone (ROCEPHIN) 1 g in sodium chloride 0.9 % 100 mL IVPB  Status:  Discontinued       ? 1 g ?200 mL/hr over 30 Minutes Intravenous Every 24 hours 01/12/22 2203 01/14/22 1332  ? 01/12/22 1900  vancomycin (VANCOCIN) IVPB 1000 mg/200 mL premix       ? 1,000 mg ?200 mL/hr over 60 Minutes Intravenous  Once 01/12/22 1854 01/12/22 2137  ? ?  ? ? ?Objective: ? ?Vital Signs ? ?Vitals:  ? 01/14/22 2000 01/15/22 0000 01/15/22 0403 01/15/22 0751  ?BP: 112/66 104/67 104/73 103/73  ?Pulse: 70 81 84 82  ?Resp: 16 16 16 16   ?Temp:  98.1 ?F (36.7 ?C) 98.2 ?F (36.8 ?C) 98.7 ?F (37.1 ?C)  ?TempSrc:  Oral    ?SpO2: 100% 95% 97% 97%  ?Weight:      ?Height:      ? ? ?Intake/Output Summary (Last 24 hours) at 01/15/2022 1227 ?Last data filed at 01/14/2022 [id number] ?Gross per 24 hour  ?Intake 1620 ml  ?Output 0 ml  ?Net 1620 ml  ? ? ?Filed Weights  ? 01/12/22 2222 01/14/22 1417  ?Weight: 102.7 kg 102.7 kg  ? ? ?General appearance: Awake alert.  In no distress ?Resp: Clear to auscultation bilaterally.  Normal effort ?Cardio: S1-S2 is normal regular.  No S3-S4.  No rubs murmurs or bruit ?GI: Abdomen is soft.  Nontender nondistended.  Bowel sounds are present normal.  No masses organomegaly ?Extremities: Left foot covered in dressing. ?Neurologic: Alert and oriented x3.  No focal neurological deficits.  ? ? ? ? ?Lab Results: ? ?Data Reviewed: I have personally reviewed labs and imaging study reports ? ?CBC: ?Recent Labs  ?Lab 01/12/22 ?1643 01/14/22 ?0352 01/15/22 ?0430  ?WBC 8.4 7.5 8.5  ?NEUTROABS 5.0  --   --   ?HGB 13.8 12.4* 11.6*  ?HCT 43.5 39.5 37.2*  ?MCV 90.4 90.8 92.1  ?PLT 307 261 247  ? ? ? ?Basic Metabolic Panel: ?Recent Labs  ?Lab 01/12/22 ?1643 01/14/22 ?0352 01/15/22 ?0430   ?NA 135 138 137  ?K 4.1 4.1 3.8  ?CL 98 101 104  ?CO2 27 26 23   ?GLUCOSE 115* 142* 139*  ?BUN 22 30* 23  ?CREATININE 1.50* 1.80* 1.24  ?CALCIUM 9.5 9.1 8.8*  ? ? ? ?GFR: ?Estimated Creatinine Clearance: 73.9 mL/min (by C-G formula based on SCr of 1.24 mg/dL). ? ?Liver Function Tests: ?Recent Labs  ?Lab 01/12/22 ?1643  ?AST 22  ?ALT 15  ?ALKPHOS 79  ?BILITOT 0.7  ?PROT 8.2*  ?ALBUMIN 3.7  ? ? ? ? ?CBG: ?Recent Labs  ?Lab 01/14/22 ?2013 01/15/22 ?[id number] 01/15/22 ?[id number] 01/15/22 ?[id number] 01/15/22 ?1139  ?GLUCAP 173* 105* 137* 142* 128*  ? ? ? ? ?  Recent Results (from the past 240 hour(s))  ?Blood culture (routine x 2)     Status: None (Preliminary result)  ? Collection Time: 01/12/22  4:43 PM  ? Specimen: BLOOD  ?Result Value Ref Range Status  ? Specimen Description BLOOD LAC  Final  ? Special Requests BOTTLES DRAWN AEROBIC AND ANAEROBIC BCAV  Final  ? Culture   Final  ?  NO GROWTH 3 DAYS ?Performed at [facility], [address] [place], [place] [address] ?  ? Report Status PENDING  Incomplete  ?Blood culture (routine x 2)     Status: None (Preliminary result)  ? Collection Time: 01/12/22  8:13 PM  ? Specimen: BLOOD  ?Result Value Ref Range Status  ? Specimen Description BLOOD LEFT ANTECUBITAL  Final  ? Special Requests   Final  ?  BOTTLES DRAWN AEROBIC AND ANAEROBIC Blood Culture adequate volume  ? Culture   Final  ?  NO GROWTH 3 DAYS ?Performed at [facility], [address], [place], [place] [address] ?  ? Report Status PENDING  Incomplete  ? ?  ? ? ?Radiology Studies: ?No results found. ? ? ? ? LOS: 3 days  ? ?Bonnielee Haff ? ?[facility] ?Pager on [email or web address] ? ?01/15/2022, 12:27 PM ? ? ?

## 2022-01-16 LAB — CBC
HCT: 36.7 % — ABNORMAL LOW (ref 39.0–52.0)
Hemoglobin: 11.7 g/dL — ABNORMAL LOW (ref 13.0–17.0)
MCH: 29 pg (ref 26.0–34.0)
MCHC: 31.9 g/dL (ref 30.0–36.0)
MCV: 90.8 fL (ref 80.0–100.0)
Platelets: 243 10*3/uL (ref 150–400)
RBC: 4.04 MIL/uL — ABNORMAL LOW (ref 4.22–5.81)
RDW: 15.5 % (ref 11.5–15.5)
WBC: 6.4 10*3/uL (ref 4.0–10.5)
nRBC: 0 % (ref 0.0–0.2)

## 2022-01-16 LAB — GLUCOSE, CAPILLARY
Glucose-Capillary: 102 mg/dL — ABNORMAL HIGH (ref 70–99)
Glucose-Capillary: 129 mg/dL — ABNORMAL HIGH (ref 70–99)
Glucose-Capillary: 137 mg/dL — ABNORMAL HIGH (ref 70–99)
Glucose-Capillary: 161 mg/dL — ABNORMAL HIGH (ref 70–99)

## 2022-01-16 LAB — BASIC METABOLIC PANEL
Anion gap: 7 (ref 5–15)
BUN: 21 mg/dL (ref 8–23)
CO2: 24 mmol/L (ref 22–32)
Calcium: 8.8 mg/dL — ABNORMAL LOW (ref 8.9–10.3)
Chloride: 102 mmol/L (ref 98–111)
Creatinine, Ser: 1.15 mg/dL (ref 0.61–1.24)
GFR, Estimated: >60 mL/min (ref >60)
Glucose, Bld: 131 mg/dL — ABNORMAL HIGH (ref 70–99)
Potassium: 3.8 mmol/L (ref 3.5–5.1)
Sodium: 133 mmol/L — ABNORMAL LOW (ref 135–145)

## 2022-01-16 MED ORDER — VANCOMYCIN HCL 1250 MG/250ML IV SOLN
1250.0000 mg | Freq: Two times a day (BID) | INTRAVENOUS | Status: DC
Start: 2022-01-16 — End: 2022-01-16

## 2022-01-16 MED ORDER — POLYETHYLENE GLYCOL 3350 17 G PO PACK: 17.0000 g | PACK | Freq: Every day | ORAL | Status: DC | PRN

## 2022-01-16 MED ORDER — POLYETHYLENE GLYCOL 3350 17 G PO PACK: 17.0000 g | PACK | Freq: Every day | ORAL | 0 refills | Status: DC | PRN

## 2022-01-16 MED ORDER — HYDROCODONE-ACETAMINOPHEN 5-325 MG PO TABS: 1.0000 | ORAL_TABLET | Freq: Four times a day (QID) | ORAL | 0 refills | Status: DC | PRN

## 2022-01-16 MED ORDER — DOXYCYCLINE HYCLATE 100 MG PO TABS
100.0000 mg | ORAL_TABLET | Freq: Two times a day (BID) | ORAL | Status: DC
  Administered 2022-01-16: 100 mg via ORAL
  Filled 2022-01-16: qty 1

## 2022-01-16 MED ORDER — CEPHALEXIN 500 MG PO CAPS: 500.0000 mg | ORAL_CAPSULE | Freq: Three times a day (TID) | ORAL | Status: DC

## 2022-01-16 MED ORDER — CEPHALEXIN 500 MG PO CAPS: 500.0000 mg | ORAL_CAPSULE | Freq: Three times a day (TID) | ORAL | 0 refills | Status: AC

## 2022-01-16 MED ORDER — SENNOSIDES-DOCUSATE SODIUM 8.6-50 MG PO TABS: 2.0000 | ORAL_TABLET | Freq: Every day | ORAL | Status: DC

## 2022-01-16 NOTE — Progress Notes (Signed)
2 Days Post-Op  ? ?Subjective/Chief Complaint: ?Patient seen.  No complaints. ? ? ?Objective: ?Vital signs in last 24 hours: ?Temp:  [98 ?F (36.7 ?C)-98.5 ?F (36.9 ?C)] 98.5 ?F (36.9 ?C) (03/19 0750) ?Pulse Rate:  [66-69] 67 (03/19 0750) ?Resp:  [16-19] 19 (03/19 0750) ?BP: (112-127)/(66-71) 112/69 (03/19 0750) ?SpO2:  [96 %-100 %] 96 % (03/19 0750) ?Last BM Date : 01/12/22 ? ?Intake/Output from previous day: ?03/18 0701 - 03/19 0700 ?In: -  ?Out: 2 [Urine:2] ?Intake/Output this shift: ?Total I/O ?In: 480 [P.O.:480] ?Out: -  ? ?Bandage on the left foot is dry and intact.  Upon removal there is still some moderate to heavy bleeding.  Incision appears well coapted with some mild cyanosis around the incision but overall appears stable.  No purulence or expressible fluid. ? ? ?Lab Results:  ?Recent Labs  ?  01/15/22 ?0430 01/16/22 ?0428  ?WBC 8.5 6.4  ?HGB 11.6* 11.7*  ?HCT 37.2* 36.7*  ?PLT 247 243  ? ?BMET ?Recent Labs  ?  01/15/22 ?0430 01/16/22 ?0428  ?NA 137 133*  ?K 3.8 3.8  ?CL 104 102  ?CO2 23 24  ?GLUCOSE 139* 131*  ?BUN 23 21  ?CREATININE 1.24 1.15  ?CALCIUM 8.8* 8.8*  ? ?PT/INR ?No results for input(s): LABPROT, INR in the last 72 hours. ?ABG ?No results for input(s): PHART, HCO3 in the last 72 hours. ? ?Invalid input(s): PCO2, PO2 ? ?Studies/Results: ?No results found. ? ?Anti-infectives: ?Anti-infectives (From admission, onward)  ? ? Start     Dose/Rate Route Frequency Ordered Stop  ? 01/16/22 1800  vancomycin (VANCOREADY) IVPB 1250 mg/250 mL       ? 1,250 mg ?166.7 mL/hr over 90 Minutes Intravenous Every 12 hours 01/16/22 0835    ? 01/15/22 1800  vancomycin (VANCOCIN) IVPB 1000 mg/200 mL premix  Status:  Discontinued       ? 1,000 mg ?200 mL/hr over 60 Minutes Intravenous Every 12 hours 01/15/22 1028 01/16/22 0835  ? 01/14/22 2200  cefTRIAXone (ROCEPHIN) 2 g in sodium chloride 0.9 % 100 mL IVPB       ? 2 g ?200 mL/hr over 30 Minutes Intravenous Every 24 hours 01/14/22 1332    ? 01/13/22 2000  vancomycin  (VANCOREADY) IVPB 1750 mg/350 mL  Status:  Discontinued       ? 1,750 mg ?175 mL/hr over 120 Minutes Intravenous Every 24 hours 01/12/22 2242 01/15/22 1028  ? 01/12/22 2330  vancomycin (VANCOREADY) IVPB 1500 mg/300 mL       ? 1,500 mg ?150 mL/hr over 120 Minutes Intravenous  Once 01/12/22 2231 01/13/22 0253  ? 01/12/22 2215  cefTRIAXone (ROCEPHIN) 1 g in sodium chloride 0.9 % 100 mL IVPB  Status:  Discontinued       ? 1 g ?200 mL/hr over 30 Minutes Intravenous Every 24 hours 01/12/22 2203 01/14/22 1332  ? 01/12/22 1900  vancomycin (VANCOCIN) IVPB 1000 mg/200 mL premix       ? 1,000 mg ?200 mL/hr over 60 Minutes Intravenous  Once 01/12/22 1854 01/12/22 2137  ? ?  ? ? ?Assessment/Plan: ?s/p Procedure(s): ?GREAT TOE AMPUTATION (Left) ?Assessment: Status post amputation left great toe. ? ?Plan: Betadine and Xeroform followed by a bulky gauze bandage applied to the left foot.  Discussed with the patient keeping this bandage clean dry and intact.  If bleeding through the bandage patient is familiar with changing the bandage using Betadine and a bulky bandage.  Should be stable for discharge and would recommend some  oral antibiotics.  Follow-up this week outpatient at the clinic. ? LOS: 4 days  ? ? ?Ricci Barker ?01/16/2022 ? ?

## 2022-01-16 NOTE — Discharge Summary (Addendum)
?[facility] ? ?Physician Discharge Summary  ? ?Patient ID: ?[name] ?MRN: [id number] ?DOB/AGE: 05-18-1956 66 y.o. ? ?Admit date: 01/12/2022 ?Discharge date: 01/16/2022   ? ?PCP: Peggye Form, NP ? ?DISCHARGE DIAGNOSES:  ?Principal Problem: ?  Left hallux osteomyelitis ([place]) ?Active Problems: ?  Lactic acidosis ?  Cellulitis of great toe of left foot ?  Diabetes mellitus type 2, uncomplicated ([place]) ?  Stage 3a chronic kidney disease ([place]) ?  Hemiparesis affecting left side as late effect of cerebrovascular accident (CVA) ([place]) ?  Depression ?  Gout ? ? ?RECOMMENDATIONS FOR OUTPATIENT FOLLOW UP: ?Outpatient follow-up with podiatry ? ?Home Health: None ?Equipment/Devices: None ? ?CODE STATUS: Full code ? ?DISCHARGE CONDITION: fair ? ?Diet recommendation: As before ? ?INITIAL HISTORY: ?66 y.o. male with medical history significant for DM, HTN, gout, depression, CKD 3A, left hemiparesis secondary to CVA, s/p partial amputation of the left great toe 2/15 secondary to osteomyelitis, with non healing plantar wound on the left foot ,on antibiotics for the past month who presented to the ED from podiatry due to concerns for osteomyelitis of the left great toe stump with plans for further amputation of the entire left toe.  Patient was started on vancomycin and hospitalized for further management.  Patient underwent amputation of the hallux on 3/17.   ? ?Consultations: ?Podiatry ? ?Procedures: ?Amputation of the left hallux ? ? ?HOSPITAL COURSE:  ? ?* Left hallux osteomyelitis ([place]) ?S/p partial amputation left great toe on 2/15.  Followed by podiatry.  Concern for osteomyelitis.   ?Patient was started on vancomycin ceftriaxone.   ?Underwent amputation of the hallux on 3/17.  Had some bleeding issues.  Hemoglobin remained stable.  Monitored for another 24 hours.  Cleared by podiatry for discharge.  Will be discharged on Keflex and doxycycline.  Patient already on doxycycline at home which he can continue.   ? ?Cellulitis of great toe of left foot ?See discussion under osteomyelitis.  ? ?Lactic acidosis ?Lactic acid level was minimally elevated but no ongoing concern for sepsis at this time.   ? ?Diabetes mellitus type 2, uncomplicated ([place]) ?Continue home medications ? ?Stage 3a chronic kidney disease ([place]) ?AKI ? ?Previous labs reviewed.  His creatinine was 2.25 on 2/6.  Stabilized around 1.37. ?Presented with creatinine of 1.5.  Climbed up to 1.8.  Gently hydrated with improvement.   ? ?Hemiparesis affecting left side as late effect of cerebrovascular accident (CVA) ([place]) ?Continue home medications.  It was documented that patient was on aspirin and Plavix.  Patient mentioned that he was told to take Plavix for 3 weeks and then stop.  He is currently only on aspirin.  ? ?Depression ?Continue home medications. ? ?Gout ?Stable.  Continue home allopurinol. ? ? ? ?Patient is stable.  Wants to go home.  Cleared by podiatry.  Okay for discharge. ? ? ?PERTINENT LABS: ? ?The results of significant diagnostics from this hospitalization (including imaging, microbiology, ancillary and laboratory) are listed below for reference.   ? ?Microbiology: ?Recent Results (from the past 240 hour(s))  ?Blood culture (routine x 2)     Status: None  ? Collection Time: 01/12/22  4:43 PM  ? Specimen: BLOOD  ?Result Value Ref Range Status  ? Specimen Description BLOOD LAC  Final  ? Special Requests BOTTLES DRAWN AEROBIC AND ANAEROBIC BCAV  Final  ? Culture   Final  ?  NO GROWTH 5 DAYS ?Performed at [facility], [address], [place], [place] [address] ?  ?  Report Status 01/17/2022 FINAL  Final  ?Blood culture (routine x 2)     Status: None  ? Collection Time: 01/12/22  8:13 PM  ? Specimen: BLOOD  ?Result Value Ref Range Status  ? Specimen Description BLOOD LEFT ANTECUBITAL  Final  ? Special Requests   Final  ?  BOTTLES DRAWN AEROBIC AND ANAEROBIC Blood Culture adequate volume  ? Culture   Final  ?  NO GROWTH 5 DAYS ?Performed at  [facility], [address], [place], [place] [address] ?  ? Report Status 01/17/2022 FINAL  Final  ?Aerobic/Anaerobic Culture w Gram Stain (surgical/deep wound)     Status: None (Preliminary result)  ? Collection Time: 01/14/22  3:39 PM  ? Specimen: PATH Bone resection; Tissue  ?Result Value Ref Range Status  ? Specimen Description   Final  ?  WOUND ?Performed at [facility], [address], [place], [place] [address] ?  ? Special Requests   Final  ?  BONE RESECT ?Performed at [facility], [address] [place], [place] [address] ?  ? Gram Stain   Final  ?  RARE WBC PRESENT, PREDOMINANTLY MONONUCLEAR ?NO ORGANISMS SEEN ?  ? Culture   Final  ?  NO GROWTH 3 DAYS NO ANAEROBES ISOLATED; CULTURE IN PROGRESS FOR 5 DAYS ?Performed at [facility], [address] [place], [place] [address] ?  ? Report Status PENDING  Incomplete  ?  ? ?Labs: ? ?COVID-19 Labs ? ? ?Lab Results  ?Component Value Date  ? [place] NEGATIVE 12/16/2021  ? [place] NEGATIVE 12/06/2021  ? ? ? ? ?Basic Metabolic Panel: ?Recent Labs  ?Lab 01/12/22 ?1643 01/14/22 ?0352 01/15/22 ?0430 01/16/22 ?0428  ?NA 135 138 137 133*  ?K 4.1 4.1 3.8 3.8  ?CL 98 101 104 102  ?CO2 27 26 23 24   ?GLUCOSE 115* 142* 139* 131*  ?BUN 22 30* 23 21  ?CREATININE 1.50* 1.80* 1.24 1.15  ?CALCIUM 9.5 9.1 8.8* 8.8*  ? ?Liver Function Tests: ?Recent Labs  ?Lab 01/12/22 ?1643  ?AST 22  ?ALT 15  ?ALKPHOS 79  ?BILITOT 0.7  ?PROT 8.2*  ?ALBUMIN 3.7  ? ? ?CBC: ?Recent Labs  ?Lab 01/12/22 ?1643 01/14/22 ?0352 01/15/22 ?0430 01/16/22 ?0428  ?WBC 8.4 7.5 8.5 6.4  ?NEUTROABS 5.0  --   --   --   ?HGB 13.8 12.4* 11.6* 11.7*  ?HCT 43.5 39.5 37.2* 36.7*  ?MCV 90.4 90.8 92.1 90.8  ?PLT 307 261 247 243  ? ? ? ?CBG: ?Recent Labs  ?Lab 01/15/22 ?2017 01/16/22 ?0040 01/16/22 ?[id number] 01/16/22 ?[id number] 01/16/22 ?1116  ?GLUCAP 135* 137* 129* 161* 102*  ? ? ? ?IMAGING STUDIES ?DG Foot Complete Right ? ?Result Date: 01/05/2022 ?CLINICAL DATA:  Nonhealing wound on  medial aspect of right foot. Present for approximately 1 year. EXAM: RIGHT FOOT COMPLETE - 3+ VIEW COMPARISON:  None. FINDINGS: There is moderate concavity/lucent ulceration of the soft tissues plantar to the metatarsophalangeal joints/proximal phalanges measuring up to approximately 10 mm in craniocaudal depth. This is favored to be plantar to the proximal phalanx of the great toe however this is indeterminate. There is moderate joint space narrowing, subchondral sclerosis and peripheral osteophytosis degenerative change of the great toe interphalangeal joint. Moderate calcaneal heel spur. Mild chronic enthesopathic change at the Achilles insertion on the calcaneus. IMPRESSION:: IMPRESSION: 1. Plantar forefoot soft tissue ulceration at the level of the metatarsophalangeal joint/proximal phalanges. No definite adjacent cortical erosion. 2. There is moderate joint space narrowing,  subchondral sclerosis and peripheral osteophytosis degenerative change of the great toe interphalangeal joint. This may be secondary to osteoarthritis. No definite skin ulcer is seen adjacent to this region, however note is made that the mild erosive appearance of the distal articular surface of the proximal phalanx may be secondary to osteoarthritis versus prior infection. Electronically Signed   By: Yvonne Kendall M.D.   On: 01/05/2022 18:52   ? ?DISCHARGE EXAMINATION: ?Vitals:  ? 01/15/22 1550 01/15/22 2128 01/16/22 0451 01/16/22 0750  ?BP: 127/71 122/68 119/66 112/69  ?Pulse: 66 69 69 67  ?Resp: 18 16 16 19   ?Temp:  98 ?F (36.7 ?C) 98.4 ?F (36.9 ?C) 98.5 ?F (36.9 ?C)  ?TempSrc:      ?SpO2: 100% 99% 97% 96%  ?Weight:      ?Height:      ? ?General appearance: Awake alert.  In no distress ?Resp: Clear to auscultation bilaterally.  Normal effort ?Cardio: S1-S2 is normal regular.  No S3-S4.  No rubs murmurs or bruit ?GI: Abdomen is soft.  Nontender nondistended.  Bowel sounds are present normal.  No masses organomegaly ? ? ?DISPOSITION:  Home ? ?Discharge Instructions   ? ? Call MD for:  difficulty breathing, headache or visual disturbances   Complete by: As directed ?  ? Call MD for:  hives   Complete by: As directed ?  ? Call MD for:  persist

## 2022-01-16 NOTE — Progress Notes (Signed)
Pharmacy Antibiotic Note ? ?[name] is a 66 y.o. male admitted on 01/12/2022 with  osteomyelitis  now s/p amputation of the left hallux.  Pharmacy has been consulted for vancomycin dosing. Renal function has improved since admission ? ?Plan: adjust vancomycin dose to 1250 mg IV q12h ?Goal AUC 400-550  ?Est AUC: 523.8 ?Calculated with SCr 1.15 mg/dL ?Ke: 0.065 h-1, T1/2 10.7h ?Daily Scr while on IV vancomycin per protocol ? ?Height: 6\' 2"  (188 cm) ?Weight: 102.7 kg (226 lb 6.6 oz) ?IBW/kg (Calculated) : 82.2 ? ?Temp (24hrs), Avg:98.3 ?F (36.8 ?C), Min:98 ?F (36.7 ?C), Max:98.5 ?F (36.9 ?C) ? ?Recent Labs  ?Lab 01/12/22 ?1643 01/12/22 ?2015 01/14/22 ?0352 01/15/22 ?0430 01/16/22 ?01/18/22  ?WBC 8.4  --  7.5 8.5 6.4  ?CREATININE 1.50*  --  1.80* 1.24 1.15  ?LATICACIDVEN 2.1* 2.1*  --   --   --   ? ?  ?Estimated Creatinine Clearance: 79.7 mL/min (by C-G formula based on SCr of 1.15 mg/dL).   ? ?No Known Allergies ? ?Antimicrobials this admission: ?vancomycin 3/15 >>  ?ceftriaxone 3/15 >>  ? ?Dose adjustments this admission: ?3/17 ceftriaxone 1 g q24h >> 2 g q24h  ?3/17 vancomycin 1750 mg IV q24h ?3/18  vancomycin 1000 mg IV q12h ?3/19  vancomycin 1250 mg IV q12h ? ?Microbiology results: ?3/17 BCx: NG x 4 days  ?3/17 WCx NG<24h (pending) ? ?Thank you for allowing pharmacy to be a part of this patient?s care. ? ?4/17, PharmD ?01/16/2022 8:30 AM ? ?

## 2022-01-16 NOTE — Plan of Care (Signed)

## 2022-01-17 LAB — CULTURE, BLOOD (ROUTINE X 2)
Culture: NO GROWTH
Culture: NO GROWTH
Special Requests: ADEQUATE

## 2022-01-18 LAB — SURGICAL PATHOLOGY

## 2022-01-19 ENCOUNTER — Encounter (HOSPITAL_BASED_OUTPATIENT_CLINIC_OR_DEPARTMENT_OTHER): Payer: Medicare Other | Admitting: Internal Medicine

## 2022-01-19 ENCOUNTER — Other Ambulatory Visit: Payer: Self-pay

## 2022-01-19 DIAGNOSIS — L97528 Non-pressure chronic ulcer of other part of left foot with other specified severity: Secondary | ICD-10-CM | POA: Diagnosis not present

## 2022-01-19 DIAGNOSIS — E11621 Type 2 diabetes mellitus with foot ulcer: Secondary | ICD-10-CM

## 2022-01-19 DIAGNOSIS — E1140 Type 2 diabetes mellitus with diabetic neuropathy, unspecified: Secondary | ICD-10-CM | POA: Diagnosis not present

## 2022-01-19 DIAGNOSIS — E1142 Type 2 diabetes mellitus with diabetic polyneuropathy: Secondary | ICD-10-CM | POA: Diagnosis not present

## 2022-01-19 DIAGNOSIS — E1165 Type 2 diabetes mellitus with hyperglycemia: Secondary | ICD-10-CM | POA: Diagnosis not present

## 2022-01-19 DIAGNOSIS — R2689 Other abnormalities of gait and mobility: Secondary | ICD-10-CM | POA: Diagnosis not present

## 2022-01-19 DIAGNOSIS — L97512 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ed: Secondary | ICD-10-CM

## 2022-01-19 DIAGNOSIS — Z89412 Acquired absence of left great toe: Secondary | ICD-10-CM | POA: Diagnosis not present

## 2022-01-19 LAB — AEROBIC/ANAEROBIC CULTURE W GRAM STAIN (SURGICAL/DEEP WOUND): Culture: NO GROWTH

## 2022-01-19 NOTE — Progress Notes (Signed)
[name], [name] ([id number]) ?Visit Report for 01/19/2022 ?Chief Complaint Document Details ?Patient Name: [name], [name] ?Date of Service: 01/19/2022 1:45 PM ?Medical Record Number: [id number] ?Patient Account Number: [id number] ?Date of Birth/Sex: Jun 19, 1956 14(65 y.o. M) ?Treating RN: Hansel FeinsteinBishop, Joy ?Primary Care Provider: Manson AllanFields, Glenda Other Clinician: ?Referring Provider: Manson AllanFields, Glenda ?Treating Provider/Extender: Geralyn CorwinHoffman, Concepcion Kirkpatrick ?Weeks in Treatment: 10 ?Information Obtained from: Patient ?Chief Complaint ?Right plantar foot wound ?Electronic Signature(s) ?Signed: 01/19/2022 2:20:03 PM By: Geralyn CorwinHoffman, Eilene Voigt DO ?Entered By: Geralyn CorwinHoffman, Sumner Kirchman on 01/19/2022 13:59:06 ?[name], [name] ([id number]) ?-------------------------------------------------------------------------------- ?HPI Details ?Patient Name: [name], [name] ?Date of Service: 01/19/2022 1:45 PM ?Medical Record Number: [id number] ?Patient Account Number: [id number] ?Date of Birth/Sex: Jun 19, 1956 [age] y.o. M) ?Treating RN: Hansel FeinsteinBishop, Joy ?Primary Care Provider: Manson AllanFields, Glenda Other Clinician: ?Referring Provider: Manson AllanFields, Glenda ?Treating Provider/Extender: Geralyn CorwinHoffman, Kallyn Demarcus ?Weeks in Treatment: 10 ?History of Present Illness ?HPI Description: Admission 11/10/2021 ?Mr. [name] MatinMichael Rands is a 66 year old male with a past medical history of uncontrolled type 2 diabetes with last hemoglobin A1c of 8.1 complicated by ?peripheral neuropathy that presents to the clinic for a 2-year history of nonhealing ulcer to the bottom of his right foot. He states this started out ?as a blister caused by a work boot. He reports receiving wound care when he resided in [place]. [place]. He is currently keeping the area clean and covered. He has insoles designed to help offload the wound bed. He currently ?denies signs of infection. ?1/18; patient presents for follow-up. He has been using Hydrofera Blue to the wound bed. He has no issues or  complaints today. He has not ?received the defender.[name]. He denies signs of infection. ?2/1; patient presents for follow-up. He has been using Hydrofera Blue to the wound bed he states he received the defender boot and has been ?using it however he did not have it on today. He currently denies signs of infection to the right foot. Unfortunately he developed a wound to the ?left great toe over the past week. He states he received new orthotics and these caused a blister to the left great toe which turned into a wound. ?He has not been doing anything to the wound bed. He currently denies systemic signs of infection. ?2/8; Patient presents for follow-up. Since last seen in the clinic he experienced a CVA and was hospitalized for 2 days. 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 He states he is about 90% recovered. He has some mild weakness to the left side. He reports ?taking the antibiotics prescribed at last clinic visit. He reports using Hydrofera Blue for dressing changes. He only uses the offloading boot when he ?is at home. He uses open toed shoes to the right foot. He currently denies signs of infection. ?2/15; patient presents for follow-up. He states he is still taking the antibiotics prescribed. He reports using the defender boot to the right foot and ?open toed shoe to the left foot however he is not wearing either today. He currently denies systemic signs of infection. He stated he cut down a ?tree last week and was outside doing yard work. ?2/22; patient presents for follow-up. 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 He reports feeling well. He reports ?using the defender boot to the right leg and continues to use Hydrofera Blue with dressing changes. He denies systemic signs of infection. ?3/8; patient presents for follow-up. He sees Dr. Alberteen Spindleline tomorrow for follow-up of the left  great toe amputation. This was a partial amputation. He ?reports  he is taking Augmentin currently. He reports increased erythema to the left great toe amputation site. He also reports a decline in his right ?plantar foot wound. He reports it is draining more. He denies purulent drainage. ?3/15; patient presents for follow-up. He did not see Dr. Alberteen Spindle last week but states he sees him today. He states he has been taking doxycycline in ?the past week and started gentamicin ointment. He continues to use [facility] with dressing changes. He currently denies systemic signs of ?infection. ?3/22; patient presents for follow-up. Patient had partial amputation of the left great toe on 2/15. 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 Alberteen Spindle. He reports no issues and has no complaints. He is currently taking Keflex and doxycycline prescribed by Dr. Alberteen Spindle. ?He continues to use gentamicin ointment and Hydrofera Blue to the right foot wound. He reports using the defender boot when he is at home. ?Electronic Signature(s) ?Signed: 01/19/2022 2:20:03 PM By: Geralyn Corwin DO ?Entered By: Geralyn Corwin on 01/19/2022 14:01:36 ?Leap, Zalmen ([id number]) ?-------------------------------------------------------------------------------- ?Physical Exam Details ?Patient Name: [name] ?Date of Service: 01/19/2022 1:45 PM ?Medical Record Number: [id number] ?Patient Account Number: [id number] ?Date of Birth/Sex: 1956-02-25 (66 y.o. M) ?Treating RN: Hansel Feinstein ?Primary Care Provider: Manson Allan Other Clinician: ?Referring Provider: Manson Allan ?Treating Provider/Extender: Geralyn Corwin ?Weeks in Treatment: 10 ?Constitutional ?. ?Cardiovascular ?[name] ?Psychiatric ?[name] ?Notes ?Right foot: To the plantar aspect there is a punched-out ulcer with granulation tissue and undermining to the lateral aspect. No tunneling noted. ?Electronic Signature(s) ?Signed: 01/19/2022 2:20:03 PM By: Geralyn Corwin DO ?Entered By: Geralyn Corwin on 01/19/2022 14:02:05 ?Bucio, Glenroy  ([id number]) ?-------------------------------------------------------------------------------- ?Physician Orders Details ?Patient Name: QUARAN, KEDZIERSKI ?Date of Service: 01/19/2022 1:45 PM ?Medical Record Number: [id number] ?Patient Account Number: [id number] ?Date of Birth/Sex: Aug 01, 1956 (66 y.o. M) ?Treating RN: Hansel Feinstein ?Primary Care Provider: Manson Allan Other Clinician: ?Referring Provider: Manson Allan ?Treating Provider/Extender: Geralyn Corwin ?Weeks in Treatment: 10 ?Verbal / Phone Orders: No ?Diagnosis Coding ?ICD-10 Coding ?Code Description ?L97.512 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ed ?L97.528 Non-pressure chronic ulcer of other part of left foot with other specified severity ?E11.621 Type 2 diabetes mellitus with foot ulcer ?E11.40 Type 2 diabetes mellitus with diabetic neuropathy, unspecified ?R26.89 Other abnormalities of gait and mobility ?Follow-up Appointments ?o Return Appointment in 1 week. ?o Nurse Visit as needed ?Bathing/ Shower/ Hygiene ?o May shower; gently cleanse wound with antibacterial soap, rinse and pat dry prior to dressing wounds ?o No tub bath. ?Edema Control - Lymphedema / Segmental Compressive Device / Other ?o Elevate, Exercise Daily and Avoid Standing for Long Periods of Time. ?o Elevate leg(s) parallel to the floor when sitting. ?o DO YOUR BEST to sleep in the bed at night. DO NOT sleep in your recliner. Long hours of sitting in a recliner leads to ?swelling of the legs and/or potential wounds on your backside. ?Additional Orders / Instructions ?o Follow Nutritious Diet and Increase Protein Intake ?o Other: - Follow up post surgery for left foot ?Wound Treatment ?Wound #1 - Foot Wound Laterality: Plantar, Right ?Cleanser: Normal Saline 1 x Per Day/30 Days ?Discharge Instructions: Wash your hands with soap and water. Remove old dressing, discard into plastic bag and place into trash. ?Cleanse the wound with Normal  Saline prior to applying a clean dressing using gauze sponges, not tissues or cotton balls. Do not scrub ?or use  excessive force. Pat dry using gauze sponges, not tissue or cotton balls. ?Topical: Natasha Bence

## 2022-01-19 NOTE — Progress Notes (Signed)
TRONG, GOSLING ([id number]) ?Visit Report for 01/19/2022 ?Arrival Information Details ?Patient Name: [name], [name] ?Date of Service: 01/19/2022 1:45 PM ?Medical Record Number: [id number] ?Patient Account Number: [id number] ?Date of Birth/Sex: 04/19/1956 (66 y.o. M) ?Treating RN: Angelina Pih ?Primary Care Cambrea Kirt: Manson Allan Other Clinician: ?Referring Harshita Bernales: Manson Allan ?Treating Shavawn Stobaugh/Extender: Geralyn Corwin ?Weeks in Treatment: 10 ?Visit Information History Since Last Visit ?Added or deleted any medications: Yes ?Patient Arrived: Ambulatory ?Any new allergies or adverse reactions: No ?Arrival Time: 13:22 ?Had a fall or experienced change in No ?Accompanied By: self ?activities of daily living that may affect ?Transfer Assistance: None ?risk of falls: ?Patient Identification Verified: Yes ?Hospitalized since last visit: Yes ?Secondary Verification Process Completed: Yes ?Has Dressing in Place as Prescribed: Yes ?Patient Requires Transmission-Based No ?Pain Present Now: Yes ?Precautions: ?Patient Has Alerts: Yes ?Patient Alerts: Patient on Blood ?Thinner ?DIABETIC ?Plavix ?Electronic Signature(s) ?Signed: 01/19/2022 3:51:21 PM By: Angelina Pih ?Entered By: Angelina Pih on 01/19/2022 13:26:02 ?Knipfer, Landon ([id number]) ?-------------------------------------------------------------------------------- ?Clinic Level of Care Assessment Details ?Patient Name: [name], [name] ?Date of Service: 01/19/2022 1:45 PM ?Medical Record Number: [id number] ?Patient Account Number: [id number] ?Date of Birth/Sex: January 24, 1956 (66 y.o. M) ?Treating RN: Angelina Pih ?Primary Care Keontae Levingston: Manson Allan Other Clinician: ?Referring Vanilla Heatherington: Manson Allan ?Treating Reilly Molchan/Extender: Geralyn Corwin ?Weeks in Treatment: 10 ?Clinic Level of Care Assessment Items ?TOOL 4 Quantity Score ?[]  - Use when only an EandM is performed on FOLLOW-UP visit 0 ?ASSESSMENTS - Nursing Assessment / Reassessment ?[]  - Reassessment of  Co-morbidities (includes updates in patient status) 0 ?X- 1 5 ?Reassessment of Adherence to Treatment Plan ?ASSESSMENTS - Wound and Skin Assessment / Reassessment ?X - Simple Wound Assessment / Reassessment - one wound 1 5 ?[]  - 0 ?Complex Wound Assessment / Reassessment - multiple wounds ?[]  - 0 ?Dermatologic / Skin Assessment (not related to wound area) ?ASSESSMENTS - Focused Assessment ?[]  - Circumferential Edema Measurements - multi extremities 0 ?[]  - 0 ?Nutritional Assessment / Counseling / Intervention ?X- 1 5 ?Lower Extremity Assessment (monofilament, tuning fork, pulses) ?[]  - 0 ?Peripheral Arterial Disease Assessment (using hand held doppler) ?ASSESSMENTS - Ostomy and/or Continence Assessment and Care ?[]  - Incontinence Assessment and Management 0 ?[]  - 0 ?Ostomy Care Assessment and Management (repouching, etc.) ?PROCESS - Coordination of Care ?X - Simple Patient / Family Education for ongoing care 1 15 ?[]  - 0 ?Complex (extensive) Patient / Family Education for ongoing care ?[]  - 0 ?Staff obtains Consents, Records, Test Results / Process Orders ?[]  - 0 ?Staff telephones HHA, Nursing Homes / Clarify orders / etc ?[]  - 0 ?Routine Transfer to another Facility (non-emergent condition) ?[]  - 0 ?Routine Hospital Admission (non-emergent condition) ?[]  - 0 ?New Admissions / / Ordering NPWT, Apligraf, etc. ?[]  - 0 ?Emergency Hospital Admission (emergent condition) ?X- 1 10 ?Simple Discharge Coordination ?[]  - 0 ?Complex (extensive) Discharge Coordination ?PROCESS - Special Needs ?[]  - Pediatric / Minor Patient Management 0 ?[]  - 0 ?Isolation Patient Management ?[]  - 0 ?Hearing / Language / Visual special needs ?[]  - 0 ?Assessment of Community assistance (transportation, D/C planning, etc.) ?[]  - 0 ?Additional assistance / Altered mentation ?[]  - 0 ?Support Surface(s) Assessment (bed, cushion, seat, etc.) ?INTERVENTIONS - Wound Cleansing / Measurement ?[name], [name] ( ) ?X- 1  5 ?Simple Wound Cleansing - one wound ?[]  - 0 ?Complex Wound Cleansing - multiple wounds ?X- 1 5 ?Wound Imaging (photographs - any number of wounds) ?[]  - 0 ?Wound Tracing (instead of photographs) ?X- 1 5 ?Simple Wound Measurement -  one wound ?[]  - 0 ?Complex Wound Measurement - multiple wounds ?INTERVENTIONS - Wound Dressings ?X - Small Wound Dressing one or multiple wounds 1 10 ?[]  - 0 ?Medium Wound Dressing one or multiple wounds ?[]  - 0 ?Large Wound Dressing one or multiple wounds ?[]  - 0 ?Application of Medications - topical ?[]  - 0 ?Application of Medications - injection ?INTERVENTIONS - Miscellaneous ?[]  - External ear exam 0 ?[]  - 0 ?Specimen Collection (cultures, biopsies, blood, body fluids, etc.) ?[]  - 0 ?Specimen(s) / Culture(s) sent or taken to Lab for analysis ?[]  - 0 ?Patient Transfer (multiple staff / / Similar devices) ?[]  - 0 ?Simple Staple / Suture removal (25 or less) ?[]  - 0 ?Complex Staple / Suture removal (26 or more) ?[]  - 0 ?Hypo / Hyperglycemic Management (close monitor of Blood Glucose) ?[]  - 0 ?Ankle / Brachial Index (ABI) - do not check if billed separately ?X- 1 5 ?Vital Signs ?Has the patient been seen at the hospital within the last three years: Yes ?Total Score: 70 ?Level Of Care: New/Established - Level ?2 ?Electronic Signature(s) ?Signed: 01/19/2022 3:51:21 PM By: ?Entered By: on 01/19/2022 14:45:06 ?Delancy, Julion ( ) ?-------------------------------------------------------------------------------- ?Encounter Discharge Information Details ?Patient Name: [name], [name] ?Date of Service: 01/19/2022 1:45 PM ?Medical Record Number: ?Patient Account Number: Nurse, adult ?Date of Birth/Sex: 02-28-1956 (66 y.o. M) ?Treating RN: ?Primary Care Oprah Camarena: Other Clinician: ?Referring Rylah Fukuda: 01/21/2022 ?Treating Lenor Provencher/Extender: Angelina Pih ?Weeks in Treatment: 10 ?Encounter Discharge  Information Items ?Discharge Condition: Stable ?Ambulatory Status: Ambulatory ?Discharge Destination: Home ?Transportation: Private Auto ?Accompanied By: self ?Schedule Follow-up Appointment: Yes ?Clinical Summary of Care: ?Electronic Signature(s) ?Signed: 01/19/2022 2:46:24 PM By: 01/21/2022 ?Entered By: [id number] on 01/19/2022 14:46:24 ?Mihelich, Deontra (01/21/2022) ?-------------------------------------------------------------------------------- ?Lower Extremity Assessment Details ?Patient Name: [name], [name] ?Date of Service: 01/19/2022 1:45 PM ?Medical Record Number: 10/24/1956 ?Patient Account Number: ([id number] ?Date of Birth/Sex: October 07, 1956 (66 y.o. M) ?Treating RN: Manson Allan ?Primary Care Shauntell Iglesia: Geralyn Corwin Other Clinician: ?Referring Tynleigh Birt: 01/21/2022 ?Treating Miracle Criado/Extender: Angelina Pih ?Weeks in Treatment: 10 ?Edema Assessment ?Assessed: [Left: No] [Right: No] ?Edema: [Left: No] [Right: No] ?Vascular Assessment ?Pulses: ?Dorsalis Pedis ?Palpable: [Right:Yes] ?Electronic Signature(s) ?Signed: 01/19/2022 3:51:21 PM By: 01/21/2022 ?Entered By: [id number] on 01/19/2022 13:34:30 ?Grenfell, Koki (01/21/2022) ?-------------------------------------------------------------------------------- ?Multi Wound Chart Details ?Patient Name: [name], [name] ?Date of Service: 01/19/2022 1:45 PM ?Medical Record Number: 10/24/1956 ?Patient Account Number: ([id number] ?Date of Birth/Sex: May 02, 1956 (67 y.o. M) ?Treating RN: Manson Allan ?Primary Care Misao Fackrell: Geralyn Corwin Other Clinician: ?Referring Kentavious Michele: 01/21/2022 ?Treating Trinton Prewitt/Extender: Angelina Pih ?Weeks in Treatment: 10 ?Vital Signs ?Height(in): 74 ?Pulse(bpm): 90 ?Weight(lbs): 226 ?Blood Pressure(mmHg): 103/65 ?Body Mass Index(BMI): 29 ?Temperature(??F): 97.3 ?Respiratory Rate(breaths/min): 18 ?Photos: [N/A:N/A] ?Wound Location: Right, Plantar Foot N/A N/A ?Wounding Event: Blister N/A N/A ?Primary Etiology:  Diabetic Wound/Ulcer of the Lower N/A N/A ?Extremity ?Comorbid History: Type II Diabetes, Osteoarthritis N/A N/A ?Date Acquired: 07/15/2020 N/A N/A ?Weeks of Treatment: 10 N/A N/A ?Wound Status: Open N/A N/A ?Wound Re

## 2022-01-26 ENCOUNTER — Encounter (HOSPITAL_BASED_OUTPATIENT_CLINIC_OR_DEPARTMENT_OTHER): Payer: Medicare Other | Admitting: Internal Medicine

## 2022-01-26 ENCOUNTER — Other Ambulatory Visit: Payer: Self-pay

## 2022-01-26 DIAGNOSIS — E11621 Type 2 diabetes mellitus with foot ulcer: Secondary | ICD-10-CM | POA: Diagnosis not present

## 2022-01-26 DIAGNOSIS — L97512 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ed: Secondary | ICD-10-CM

## 2022-01-26 DIAGNOSIS — E1140 Type 2 diabetes mellitus with diabetic neuropathy, unspecified: Secondary | ICD-10-CM

## 2022-01-26 NOTE — Progress Notes (Signed)
[name], Demarlo ([id number]) ?Visit Report for 01/26/2022 ?Chief Complaint Document Details ?Patient Name: [name], [name] ?Date of Service: 01/26/2022 9:15 AM ?Medical Record Number: [id number] ?Patient Account Number: [id number] ?Date of Birth/Sex: [birth year](65 y.o. M) ?Treating RN: Hansel FeinsteinBishop, Joy ?Primary Care Provider: Manson AllanFields, Glenda Other Clinician: ?Referring Provider: Manson AllanFields, Glenda ?Treating Provider/Extender: Geralyn CorwinHoffman, Jaclyne Haverstick ?Weeks in Treatment: 11 ?Information Obtained from: Patient ?Chief Complaint ?Right plantar foot wound ?Electronic Signature(s) ?Signed: 01/26/2022 9:13:24 AM By: Geralyn CorwinHoffman, Emony Dormer DO ?Entered By: Geralyn CorwinHoffman, Daiden Coltrane on 01/26/2022 09:08:40 ?[name], [name] ([id number]) ?-------------------------------------------------------------------------------- ?HPI Details ?Patient Name: [name], [name] ?Date of Service: 01/26/2022 9:15 AM ?Medical Record Number: [id number] ?Patient Account Number: [id number] ?Date of Birth/Sex: [birth year] [age] y.o. M) ?Treating RN: Hansel FeinsteinBishop, Joy ?Primary Care Provider: Manson AllanFields, Glenda Other Clinician: ?Referring Provider: Manson AllanFields, Glenda ?Treating Provider/Extender: Geralyn CorwinHoffman, Inice Sanluis ?Weeks in Treatment: 11 ?History of Present Illness ?HPI Description: Admission 11/10/2021 ?[name] is a 66 year old male with a past medical history of uncontrolled type 2 diabetes with last hemoglobin A1c of 8.1 complicated by ?peripheral neuropathy that presents to the clinic for a 2-year history of nonhealing ulcer to the bottom of his right foot. He states this started out ?as a blister caused by a work boot. He reports receiving wound care when he resided in [place]. He is re[place] [facility] [place] today. He is currently keeping the area clean and covered. He has insoles designed to help offload the wound bed. He currently ?denies signs of infection. ?1/18; patient presents for follow-up. He has been using Hydrofera Blue to the wound bed. He has no issues or  complaints today. He has not ?received the defender.[name]. He denies signs of infection. ?2/1; patient presents for follow-up. He has been using Hydrofera Blue to the wound bed he states he received the defender boot and has been ?using it however he did not have it on today. He currently denies signs of infection to the right foot. Unfortunately he developed a wound to the ?left great toe over the past week. He states he received new orthotics and these caused a blister to the left great toe which turned into a wound. ?He has not been doing anything to the wound bed. He currently denies systemic signs of infection. ?2/8; Patient presents for follow-up. Since last seen in the clinic he experienced a CVA and was hospitalized for 2 days. 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 He states he is about 90% recovered. He has some mild weakness to the left side. He reports ?taking the antibiotics prescribed at last clinic visit. He reports using Hydrofera Blue for dressing changes. He only uses the offloading boot when he ?is at home. He uses open toed shoes to the right foot. He currently denies signs of infection. ?2/15; patient presents for follow-up. He states he is still taking the antibiotics prescribed. He reports using the defender boot to the right foot and ?open toed shoe to the left foot however he is not wearing either today. He currently denies systemic signs of infection. He stated he cut down a ?tree last week and was outside doing yard work. ?2/22; patient presents for follow-up. 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 He reports feeling well. He reports ?using the defender boot to the right leg and continues to use Hydrofera Blue with dressing changes. He denies systemic signs of infection. ?3/8; patient presents for follow-up. He sees Dr. Alberteen Spindleline tomorrow for follow-up of the left  great toe amputation. This was a partial amputation. He ?reports  he is taking Augmentin currently. He reports increased erythema to the left great toe amputation site. He also reports a decline in his right ?plantar foot wound. He reports it is draining more. He denies purulent drainage. ?3/15; patient presents for follow-up. He did not see Dr. Alberteen Spindle last week but states he sees him today. He states he has been taking doxycycline in ?the past week and started gentamicin ointment. He continues to use [facility] with dressing changes. He currently denies systemic signs of ?infection. ?3/22; patient presents for follow-up. Patient had partial amputation of the left great toe on 2/15. 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 Alberteen Spindle. He reports no issues and has no complaints. He is currently taking Keflex and doxycycline prescribed by Dr. Alberteen Spindle. ?He continues to use gentamicin ointment and Hydrofera Blue to the right foot wound. He reports using the defender boot when he is at home. ?3/29; patient presents for follow-up. He continues to be on antibiotics per podiatry. He has been using Hydrofera Blue and gentamicin ointment to ?the right plantar foot wound. He has no issues or complaints today. He denies signs of infection. ?Electronic Signature(s) ?Signed: 01/26/2022 9:13:24 AM By: Geralyn Corwin DO ?Entered By: Geralyn Corwin on 01/26/2022 09:09:15 ?[name], [name] ([id number]) ?-------------------------------------------------------------------------------- ?Physical Exam Details ?Patient Name: [name], [name] ?Date of Service: 01/26/2022 9:15 AM ?Medical Record Number: [id number] ?Patient Account Number: [id number] ?Date of Birth/Sex: [birth year] (66 y.o. M) ?Treating RN: Hansel Feinstein ?Primary Care Provider: Manson Allan Other Clinician: ?Referring Provider: Manson Allan ?Treating Provider/Extender: Geralyn Corwin ?Weeks in Treatment: 11 ?Constitutional ?. ?Cardiovascular ?[name] ?Psychiatric ?[name] ?Notes ?Right foot: To the plantar aspect there is an  open wound with granulation tissue although does not appear healthy. There is still undermining to ?the lateral aspect. No increased warmth, erythema or purulent drainage to the surrounding skin. ?Electronic Signature(s) ?Signed: 01/26/2022 9:13:24 AM By: Geralyn Corwin DO ?Entered By: Geralyn Corwin on 01/26/2022 09:10:04 ?Gerst, [name] ([id number]) ?-------------------------------------------------------------------------------- ?Physician Orders Details ?Patient Name: [name], [name] ?Date of Service: 01/26/2022 9:15 AM ?Medical Record Number: [id number] ?Patient Account Number: [id number] ?Date of Birth/Sex: [birth year] (66 y.o. M) ?Treating RN: Hansel Feinstein ?Primary Care Provider: Manson Allan Other Clinician: ?Referring Provider: Manson Allan ?Treating Provider/Extender: Geralyn Corwin ?Weeks in Treatment: 11 ?Verbal / Phone Orders: No ?Diagnosis Coding ?Follow-up Appointments ?o Return Appointment in 1 week. ?o Nurse Visit as needed ?Bathing/ Shower/ Hygiene ?o May shower; gently cleanse wound with antibacterial soap, rinse and pat dry prior to dressing wounds ?o No tub bath. ?Anesthetic (Use 'Patient Medications' Section for Anesthetic Order Entry) ?o Lidocaine applied to wound bed ?Edema Control - Lymphedema / Segmental Compressive Device / Other ?o Elevate, Exercise Daily and Avoid Standing for Long Periods of Time. ?o Elevate leg(s) parallel to the floor when sitting. ?o DO YOUR BEST to sleep in the bed at night. DO NOT sleep in your recliner. Long hours of sitting in a recliner leads to ?swelling of the legs and/or potential wounds on your backside. ?Off-Loading ?o Foot Defender o - right foot-keep shin covered-keep pressure off of the wound ?Additional Orders / Instructions ?o Follow Nutritious Diet and Increase Protein Intake - monitor blood sugar to maintain normal limits ?o Other: - Follow up post surgery for left foot ?Medications-Please add to medication list. ?o P.O.  Antibiotics - Continue as ordered ?Wound Treatment ?Wound #1 - Foot Wound Laterality: Plantar, Right ?Cleanser: Dakin 16 (oz)  0.25 2 x Per Day/30 Days ?Discharge Instructions: Use as directed. ?Cleanser: No

## 2022-01-26 NOTE — Progress Notes (Signed)
[name], [name] ([id number]) ?Visit Report for 01/26/2022 ?Arrival Information Details ?Patient Name: [name], [name] ?Date of Service: 01/26/2022 9:15 AM ?Medical Record Number: [id number] ?Patient Account Number: [id number] ?Date of Birth/Sex: 03/18/1956 (66 y.o. M) ?Treating RN: Hansel Feinstein ?Primary Care Shataria Crist: Manson Allan Other Clinician: ?Referring Saharah Sherrow: Manson Allan ?Treating Camilia Caywood/Extender: Geralyn Corwin ?Weeks in Treatment: 11 ?Visit Information History Since Last Visit ?Added or deleted any medications: No ?Patient Arrived: Ambulatory ?Had a fall or experienced change in No ?Arrival Time: 08:33 ?activities of daily living that may affect ?Accompanied By: self ?risk of falls: ?Transfer Assistance: None ?Hospitalized since last visit: No ?Patient Identification Verified: Yes ?Has Dressing in Place as Prescribed: Yes ?Secondary Verification Process Completed: Yes ?Pain Present Now: No ?Patient Requires Transmission-Based No ?Precautions: ?Patient Has Alerts: Yes ?Patient Alerts: Patient on Blood ?Thinner ?DIABETIC ?Plavix ?Electronic Signature(s) ?Signed: 01/26/2022 3:52:05 PM By: Hansel Feinstein ?Entered ByHansel Feinstein on 01/26/2022 08:35:07 ?Verbeke, Merrel ([id number]) ?-------------------------------------------------------------------------------- ?Clinic Level of Care Assessment Details ?Patient Name: [name], [name] ?Date of Service: 01/26/2022 9:15 AM ?Medical Record Number: [id number] ?Patient Account Number: [id number] ?Date of Birth/Sex: 07/28/1956 (66 y.o. M) ?Treating RN: Hansel Feinstein ?Primary Care Joell Usman: Manson Allan Other Clinician: ?Referring Ceirra Belli: Manson Allan ?Treating Tika Hannis/Extender: Geralyn Corwin ?Weeks in Treatment: 11 ?Clinic Level of Care Assessment Items ?TOOL 4 Quantity Score ?[]  - Use when only an EandM is performed on FOLLOW-UP visit 0 ?ASSESSMENTS - Nursing Assessment / Reassessment ?[]  - Reassessment of Co-morbidities (includes updates in patient status) 0 ?[]  -  0 ?Reassessment of Adherence to Treatment Plan ?ASSESSMENTS - Wound and Skin Assessment / Reassessment ?X - Simple Wound Assessment / Reassessment - one wound 1 5 ?[]  - 0 ?Complex Wound Assessment / Reassessment - multiple wounds ?[]  - 0 ?Dermatologic / Skin Assessment (not related to wound area) ?ASSESSMENTS - Focused Assessment ?[]  - Circumferential Edema Measurements - multi extremities 0 ?[]  - 0 ?Nutritional Assessment / Counseling / Intervention ?[]  - 0 ?Lower Extremity Assessment (monofilament, tuning fork, pulses) ?[]  - 0 ?Peripheral Arterial Disease Assessment (using hand held doppler) ?ASSESSMENTS - Ostomy and/or Continence Assessment and Care ?[]  - Incontinence Assessment and Management 0 ?[]  - 0 ?Ostomy Care Assessment and Management (repouching, etc.) ?PROCESS - Coordination of Care ?X - Simple Patient / Family Education for ongoing care 1 15 ?[]  - 0 ?Complex (extensive) Patient / Family Education for ongoing care ?[]  - 0 ?Staff obtains Consents, Records, Test Results / Process Orders ?[]  - 0 ?Staff telephones HHA, Nursing Homes / Clarify orders / etc ?[]  - 0 ?Routine Transfer to another Facility (non-emergent condition) ?[]  - 0 ?Routine Hospital Admission (non-emergent condition) ?[]  - 0 ?New Admissions / / Ordering NPWT, Apligraf, etc. ?[]  - 0 ?Emergency Hospital Admission (emergent condition) ?X- 1 10 ?Simple Discharge Coordination ?[]  - 0 ?Complex (extensive) Discharge Coordination ?PROCESS - Special Needs ?[]  - Pediatric / Minor Patient Management 0 ?[]  - 0 ?Isolation Patient Management ?[]  - 0 ?Hearing / Language / Visual special needs ?[]  - 0 ?Assessment of Community assistance (transportation, D/C planning, etc.) ?[]  - 0 ?Additional assistance / Altered mentation ?[]  - 0 ?Support Surface(s) Assessment (bed, cushion, seat, etc.) ?INTERVENTIONS - Wound Cleansing / Measurement ?ANTWAINE, BOOMHOWER ( ) ?X- 1 5 ?Simple Wound Cleansing - one wound ?[]  - 0 ?Complex Wound  Cleansing - multiple wounds ?X- 1 5 ?Wound Imaging (photographs - any number of wounds) ?[]  - 0 ?Wound Tracing (instead of photographs) ?X- 1 5 ?Simple Wound Measurement - one wound ?[]  - 0 ?Complex  Wound Measurement - multiple wounds ?INTERVENTIONS - Wound Dressings ?X - Small Wound Dressing one or multiple wounds 1 10 ?[]  - 0 ?Medium Wound Dressing one or multiple wounds ?[]  - 0 ?Large Wound Dressing one or multiple wounds ?[]  - 0 ?Application of Medications - topical ?[]  - 0 ?Application of Medications - injection ?INTERVENTIONS - Miscellaneous ?[]  - External ear exam 0 ?[]  - 0 ?Specimen Collection (cultures, biopsies, blood, body fluids, etc.) ?[]  - 0 ?Specimen(s) / Culture(s) sent or taken to Lab for analysis ?[]  - 0 ?Patient Transfer (multiple staff / / Similar devices) ?[]  - 0 ?Simple Staple / Suture removal (25 or less) ?[]  - 0 ?Complex Staple / Suture removal (26 or more) ?[]  - 0 ?Hypo / Hyperglycemic Management (close monitor of Blood Glucose) ?[]  - 0 ?Ankle / Brachial Index (ABI) - do not check if billed separately ?X- 1 5 ?Vital Signs ?Has the patient been seen at the hospital within the last three years: Yes ?Total Score: 60 ?Level Of Care: New/Established - Level ?2 ?Electronic Signature(s) ?Signed: 01/26/2022 3:52:05 PM By: ?Entered By on 01/26/2022 09:13:35 ?Mcmorris, Zackary ( ) ?-------------------------------------------------------------------------------- ?Encounter Discharge Information Details ?Patient Name: [name], [name] ?Date of Service: 01/26/2022 9:15 AM ?Medical Record Number: Nurse, adult ?Patient Account Number: ?Date of Birth/Sex: 05-12-1956 (66 y.o. M) ?Treating RN: ?Primary Care Rakhi Romagnoli: 01/28/2022 Other Clinician: ?Referring Svara Twyman: Hansel Feinstein ?Treating Mateusz Neilan/Extender: Hansel Feinstein ?Weeks in Treatment: 11 ?Encounter Discharge Information Items ?Discharge Condition: Stable ?Ambulatory Status:  Ambulatory ?Discharge Destination: Home ?Transportation: Private Auto ?Accompanied By: self ?Schedule Follow-up Appointment: Yes ?Clinical Summary of Care: ?Electronic Signature(s) ?Signed: 01/26/2022 3:52:05 PM By: [id number] ?Entered ByJarome Matin on 01/26/2022 09:18:08 ?Kemmerling, Eyob ([id number]) ?-------------------------------------------------------------------------------- ?Lower Extremity Assessment Details ?Patient Name: JADARION, HALBIG ?Date of Service: 01/26/2022 9:15 AM ?Medical Record Number: ([id number] ?Patient Account Number: Hansel Feinstein ?Date of Birth/Sex: Nov 29, 1955 (66 y.o. M) ?Treating RN: Geralyn Corwin ?Primary Care Kohan Azizi: 01/28/2022 Other Clinician: ?Referring Lauriana Denes: Hansel Feinstein ?Treating Rooney Swails/Extender: Hansel Feinstein ?Weeks in Treatment: 11 ?Edema Assessment ?Assessed: [Left: No] [Right: Yes] ?Edema: [Left: N] [Right: o] ?Ankle ?Left: Right: ?Point of Measurement: From Medial Instep 22 cm ?Vascular Assessment ?Pulses: ?Dorsalis Pedis ?Palpable: [Right:Yes] ?Electronic Signature(s) ?Signed: 01/26/2022 3:52:05 PM By: [id number] ?Entered ByJarome Matin on 01/26/2022 08:40:09 ?Radman, Chia ([id number]) ?-------------------------------------------------------------------------------- ?Multi Wound Chart Details ?Patient Name: ALEXANDRE, FARIES ?Date of Service: 01/26/2022 9:15 AM ?Medical Record Number: ([id number] ?Patient Account Number: Hansel Feinstein ?Date of Birth/Sex: [birth year] (66 y.o. M) ?Treating RN: Geralyn Corwin ?Primary Care Catlin Aycock: 01/28/2022 Other Clinician: ?Referring Marinna Blane: Hansel Feinstein ?Treating Javonne Louissaint/Extender: Hansel Feinstein ?Weeks in Treatment: 11 ?Vital Signs ?Height(in): 74 ?Pulse(bpm): 87 ?Weight(lbs): 226 ?Blood Pressure(mmHg): 131/81 ?Body Mass Index(BMI): 29 ?Temperature(??F): 97.5 ?Respiratory Rate(breaths/min): 16 ?Photos: [N/A:N/A] ?Wound Location: Right, Plantar Foot N/A N/A ?Wounding Event: Blister N/A N/A ?Primary Etiology: Diabetic Wound/Ulcer of  the Lower N/A N/A ?Extremity ?Comorbid History: Type II Diabetes, Osteoarthritis N/A N/A ?Date Acquired: 07/15/2020 N/A N/A ?Weeks of Treatment: 11 N/A N/A ?Wound Status: Open N/A N/A ?Wound Recurrence: No N/A N/A ?Measureme

## 2022-02-02 ENCOUNTER — Encounter: Payer: Medicare Other | Attending: Internal Medicine | Admitting: Internal Medicine

## 2022-02-02 DIAGNOSIS — L97512 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ed: Secondary | ICD-10-CM | POA: Insufficient documentation

## 2022-02-02 DIAGNOSIS — R2689 Other abnormalities of gait and mobility: Secondary | ICD-10-CM | POA: Diagnosis not present

## 2022-02-02 DIAGNOSIS — E1140 Type 2 diabetes mellitus with diabetic neuropathy, unspecified: Secondary | ICD-10-CM | POA: Insufficient documentation

## 2022-02-02 DIAGNOSIS — E1142 Type 2 diabetes mellitus with diabetic polyneuropathy: Secondary | ICD-10-CM | POA: Diagnosis not present

## 2022-02-02 DIAGNOSIS — Z89412 Acquired absence of left great toe: Secondary | ICD-10-CM | POA: Insufficient documentation

## 2022-02-02 DIAGNOSIS — L97528 Non-pressure chronic ulcer of other part of left foot with other specified severity: Secondary | ICD-10-CM | POA: Insufficient documentation

## 2022-02-02 DIAGNOSIS — E11621 Type 2 diabetes mellitus with foot ulcer: Secondary | ICD-10-CM | POA: Insufficient documentation

## 2022-02-02 NOTE — Progress Notes (Signed)
[name], [name] ([id number]) ?Visit Report for 02/02/2022 ?Chief Complaint Document Details ?Patient Name: [name], [name] ?Date of Service: 02/02/2022 9:15 AM ?Medical Record Number: [id number] ?Patient Account Number: [id number] ?Date of Birth/Sex: [birth year] (66 y.o. M) ?Treating RN: Hansel Feinstein ?Primary Care Provider: Manson Allan Other Clinician: ?Referring Provider: Manson Allan ?Treating Provider/Extender: Geralyn Corwin ?Weeks in Treatment: 12 ?Information Obtained from: Patient ?Chief Complaint ?Right plantar foot wound ?Electronic Signature(s) ?Signed: 02/02/2022 11:52:05 AM By: Geralyn Corwin DO ?Entered By: Geralyn Corwin on 02/02/2022 11:09:57 ?[name] ([id number]) ?-------------------------------------------------------------------------------- ?Debridement Details ?Patient Name: [name], [name] ?Date of Service: 02/02/2022 9:15 AM ?Medical Record Number: [id number] ?Patient Account Number: [id number] ?Date of Birth/Sex: 08/10/1956 (66 y.o. M) ?Treating RN: Hansel Feinstein ?Primary Care Provider: Manson Allan Other Clinician: ?Referring Provider: Manson Allan ?Treating Provider/Extender: Geralyn Corwin ?Weeks in Treatment: 12 ?Debridement Performed for ?Wound #1 Right,Plantar Foot ?Assessment: ?Performed By: Physician Geralyn Corwin, MD ?Debridement Type: Debridement ?Severity of Tissue Pre Debridement: Fat layer exposed ?Level of Consciousness (Pre- ?Awake and Alert ?procedure): ?Pre-procedure Verification/Time Out ?Yes - 09:20 ?Taken: ?Start Time: 09:21 ?Pain Control: Lidocaine ?Total Area Debrided (L x W): 0.5 (cm) x 0.3 (cm) = 0.15 (cm?) ?Tissue and other material ?Non-Viable, Callus ?debrided: ?Level: Non-Viable Tissue ?Debridement Description: Selective/Open Wound ?Instrument: Curette ?Bleeding: None ?Response to Treatment: Procedure was tolerated well ?Level of Consciousness (Post- ?Awake and Alert ?procedure): ?Post Debridement Measurements of Total Wound ?Length: (cm) 1 ?Width: (cm)  0.7 ?Depth: (cm) 0.4 ?Volume: (cm?) 0.22 ?Character of Wound/Ulcer Post Debridement: Improved ?Severity of Tissue Post Debridement: Fat layer exposed ?Post Procedure Diagnosis ?Same as Pre-procedure ?Electronic Signature(s) ?Signed: 02/02/2022 11:52:05 AM By: Geralyn Corwin DO ?Signed: 02/02/2022 12:06:15 PM By: Hansel Feinstein ?Entered ByHansel Feinstein on 02/02/2022 09:24:40 ?[name] ([id number]) ?-------------------------------------------------------------------------------- ?HPI Details ?Patient Name: [name], [name] ?Date of Service: 02/02/2022 9:15 AM ?Medical Record Number: [id number] ?Patient Account Number: [id number] ?Date of Birth/Sex: Mar 19, 1956 (66 y.o. M) ?Treating RN: Hansel Feinstein ?Primary Care Provider: Manson Allan Other Clinician: ?Referring Provider: Manson Allan ?Treating Provider/Extender: Geralyn Corwin ?Weeks in Treatment: 12 ?History of Present Illness ?HPI Description: Admission 11/10/2021 ?[name] is a 66 year old male with a past medical history of uncontrolled type 2 diabetes with last hemoglobin A1c of 8.1 complicated by ?peripheral neuropathy that presents to the clinic for a 2-year history of nonhealing ulcer to the bottom of his right foot. He states this started out ?as a blister caused by a work boot. He reports receiving wound care when he resided in [place]. He is reestablishing his wound care in [place] today. He is currently keeping the area clean and covered. He has insoles designed to help offload the wound bed. He currently ?denies signs of infection. ?1/18; patient presents for follow-up. He has been using Hydrofera Blue to the wound bed. He has no issues or complaints today. He has not ?received the defender.[name] He denies signs of infection. ?2/1; patient presents for follow-up. He has been using Hydrofera Blue to the wound bed he states he received the defender boot and has been ?using it however he did not have it on today. He currently denies  signs of infection to the right foot. Unfortunately he developed a wound to the ?left great toe over the past week. He states he received new orthotics and these caused a blister to the left great toe which turned into a wound. ?He has not been doing anything to the wound bed. He currently denies systemic signs of infection. ?2/8; Patient presents for follow-up. Since  last seen in the clinic he experienced a CVA and was hospitalized for 2 days. 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 He states he is about 90% recovered. He has some mild weakness to the left side. He reports ?taking the antibiotics prescribed at last clinic visit. He reports using Hydrofera Blue for dressing changes. He only uses the offloading boot when he ?is at home. He uses open toed shoes to the right foot. He currently denies signs of infection. ?2/15; patient presents for follow-up. He states he is still taking the antibiotics prescribed. He reports using the defender boot to the right foot and ?open toed shoe to the left foot however he is not wearing either today. He currently denies systemic signs of infection. He stated he cut down a ?tree last week and was outside doing yard work. ?2/22; patient presents for follow-up. 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 He reports feeling well. He reports ?using the defender boot to the right leg and continues to use Hydrofera Blue with dressing changes. He denies systemic signs of infection. ?3/8; patient presents for follow-up. He sees Dr. Alberteen Spindle tomorrow for follow-up of the left great toe amputation. This was a partial amputation. He ?reports he is taking Augmentin currently. He reports increased erythema to the left great toe amputation site. He also reports a decline in his right ?plantar foot wound. He reports it is draining more. He denies purulent drainage. ?3/15; patient presents for follow-up. He did not see Dr. Alberteen Spindle last  week but states he sees him today. He states he has been taking doxycycline in ?the past week and started gentamicin ointment. He continues to use [facility] with dressing changes. He currently denies systemic signs of ?infection. ?3/22; patient presents for follow-up. Patient had partial amputation of the left great toe on 2/15. 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 Alberteen Spindle. He reports no issues and has no complaints. He is currently taking Keflex and doxycycline prescribed by Dr. Alberteen Spindle. ?He continues to use gentamicin ointment and Hydrofera Blue to the right foot wound. He reports using the defender boot when he is at home. ?3/29; patient presents for follow-up. He continues to be on antibiotics per podiatry. He has been using Hydrofera Blue and gentamicin ointment to ?the right plantar foot wound. He has no issues or complaints today. He denies signs of infection. ?4/5; patient presents for follow-up. He has been using Dakin's wet-to-dry dressings with improvement to wound healing. He denies signs of ?infection. She has no issues or complaints today. ?Electronic Signature(s) ?Signed: 02/02/2022 11:52:05 AM By: Geralyn Corwin DO ?Entered By: Geralyn Corwin on 02/02/2022 11:10:27 ?Encinas, [name] ([id number]) ?-------------------------------------------------------------------------------- ?Physical Exam Details ?Patient Name: OM, [name] ?Date of Service: 02/02/2022 9:15 AM ?Medical Record Number: [id number] ?Patient Account Number: [id number] ?Date of Birth/Sex: 1956/03/24 (66 y.o. M) ?Treating RN: Hansel Feinstein ?Primary Care Provider: Manson Allan Other Clinician: ?Referring Provider: Manson Allan ?Treating Provider/Extender: Geralyn Corwin ?Weeks in Treatment: 12 ?Constitutional ?. ?Cardiovascular ?[name] ?Psychiatric ?[name] ?Notes ?Right foot: To the plantar aspect there is an open wound with granulation tissue. No undermining noted. Circumferential callus. No signs of ?surrounding  infection. ?Electronic Signature(s) ?Signed: 02/02/2022 11:52:05 AM By: Geralyn Corwin DO ?Entered By: Geralyn Corwin on 02/02/2022 11:12:51 ?Pasquariello, [name] ([id number]) ?------------------------------------

## 2022-02-02 NOTE — Progress Notes (Addendum)
WILBERTH, DAMON ([id number]) ?Visit Report for 02/02/2022 ?Arrival Information Details ?Patient Name: KELSON, QUEENAN ?Date of Service: 02/02/2022 9:15 AM ?Medical Record Number: [id number] ?Patient Account Number: [id number] ?Date of Birth/Sex: 1956/07/15 (66 y.o. M) ?Treating RN: Donnamarie Poag ?Primary Care Laymon Stockert: [name] Other Clinician: ?Referring Deztiny Sarra: [name] ?Treating Yumna Ebers/Extender: Kalman Shan ?Weeks in Treatment: 12 ?Visit Information History Since Last Visit ?Added or deleted any medications: No ?Patient Arrived: Ambulatory ?Had a fall or experienced change in No ?Arrival Time: 09:01 ?activities of daily living that may affect ?Accompanied By: self ?risk of falls: ?Transfer Assistance: None ?Hospitalized since last visit: No ?Patient Identification Verified: Yes ?Has Dressing in Place as Prescribed: Yes ?Secondary Verification Process Completed: Yes ?Pain Present Now: No ?Patient Requires Transmission-Based No ?Precautions: ?Patient Has Alerts: Yes ?Patient Alerts: Patient on Blood ?Thinner ?DIABETIC ?Plavix ?Electronic Signature(s) ?Signed: 02/02/2022 9:14:49 AM By: Donnamarie Poag ?Entered ByDonnamarie Poag on 02/02/2022 09:04:43 ?Cali, Keenan ([id number]) ?-------------------------------------------------------------------------------- ?Encounter Discharge Information Details ?Patient Name: BROWN, DUNLAP ?Date of Service: 02/02/2022 9:15 AM ?Medical Record Number: [id number] ?Patient Account Number: [id number] ?Date of Birth/Sex: 1956-06-17 (66 y.o. M) ?Treating RN: Donnamarie Poag ?Primary Care Tonjua Rossetti: [name] Other Clinician: ?Referring Ulyess Muto: [name] ?Treating Braileigh Landenberger/Extender: Kalman Shan ?Weeks in Treatment: 12 ?Encounter Discharge Information Items Post Procedure Vitals ?Discharge Condition: Stable ?Temperature (?F): 98.4 ?Ambulatory Status: Ambulatory ?Pulse (bpm): 100 ?Discharge Destination: Home ?Respiratory Rate (breaths/min): 16 ?Transportation: Private  Auto ?Blood Pressure (mmHg): 107/71 ?Accompanied By: self ?Schedule Follow-up Appointment: Yes ?Clinical Summary of Care: ?Electronic Signature(s) ?Signed: 02/02/2022 12:06:15 PM By: Donnamarie Poag ?Entered ByDonnamarie Poag on 02/02/2022 09:31:17 ?Towles, Airen ([id number]) ?-------------------------------------------------------------------------------- ?Lower Extremity Assessment Details ?Patient Name: LUCUS, LAMBERTSON ?Date of Service: 02/02/2022 9:15 AM ?Medical Record Number: [id number] ?Patient Account Number: [id number] ?Date of Birth/Sex: February 27, 1956 (66 y.o. M) ?Treating RN: Donnamarie Poag ?Primary Care Addysen Louth: [name] Other Clinician: ?Referring Daquisha Clermont: [name] ?Treating Elloise Roark/Extender: Kalman Shan ?Weeks in Treatment: 12 ?Edema Assessment ?Assessed: [Left: No] [Right: Yes] ?Edema: [Left: N] [Right: o] ?Ankle ?Left: Right: ?Point of Measurement: From Medial Instep 22 cm ?Vascular Assessment ?Pulses: ?Dorsalis Pedis ?Palpable: [Right:Yes] ?Electronic Signature(s) ?Signed: 02/02/2022 9:14:49 AM By: Donnamarie Poag ?Entered ByDonnamarie Poag on 02/02/2022 09:10:29 ?Paro, Kesean ([id number]) ?-------------------------------------------------------------------------------- ?Multi Wound Chart Details ?Patient Name: PARTHIV, MUCCI ?Date of Service: 02/02/2022 9:15 AM ?Medical Record Number: [id number] ?Patient Account Number: [id number] ?Date of Birth/Sex: [birth year] (67 y.o. M) ?Treating RN: Donnamarie Poag ?Primary Care Jahlisa Rossitto: [name] Other Clinician: ?Referring Tyrena Gohr: [name] ?Treating [name] [name]/Extender: Kalman Shan ?Weeks in Treatment: 12 ?Vital Signs ?Height(in): 74 ?Pulse(bpm): 111 ?Weight(lbs): 226 ?Blood Pressure(mmHg): 107/71 ?Body Mass Index(BMI): 29 ?Temperature(??F): 98.4 ?Respiratory Rate(breaths/min): 16 ?Photos: [N/A:N/A] ?Wound Location: Right, Plantar Foot N/A N/A ?Wounding Event: Blister N/A N/A ?Primary Etiology: Diabetic Wound/Ulcer of the Lower N/A N/A ?Extremity ?Comorbid  History: Type II Diabetes, Osteoarthritis N/A N/A ?Date Acquired: 07/15/2020 N/A N/A ?Weeks of Treatment: 12 N/A N/A ?Wound Status: Open N/A N/A ?Wound Recurrence: No N/A N/A ?Measurements L x W x D (cm) 1x0.7x0.4 N/A N/A ?Area (cm?) : 0.55 N/A N/A ?Volume (cm?) : 0.22 N/A N/A ?% Reduction in Area: 51.00% N/A N/A ?% Reduction in Volume: 67.40% N/A N/A ?Starting Position 1 (o'clock): 9 ?Ending Position 1 (o'clock): 12 ?Maximum Distance 1 (cm): 0.1 ?Undermining: Yes N/A N/A ?Classification: Grade 2 N/A N/A ?Exudate Amount: Medium N/A N/A ?Exudate Type: Serosanguineous N/A N/A ?Exudate Color: red, brown N/A N/A ?Wound Margin: Thickened N/A N/A ?Granulation Amount: Large (67-100%) N/A N/A ?Granulation Quality: Red, Pink N/A N/A ?  Necrotic Amount: Small (1-33%) N/A N/A ?Exposed Structures: ?Fat Layer (Subcutaneous Tissue): N/A N/A ?Yes ?Fascia: No ?Tendon: No ?Muscle: No ?Joint: No ?Bone: No ?Epithelialization: None N/A N/A ?Treatment Notes ?Electronic Signature(s) ?Signed: 02/02/2022 9:14:49 AM By: Bishop, Joy ?Twyman, Othal ([id number]) ?Entered By: Bishop, Joy on 02/02/2022 09:10:56 ?Trentman, Niko ([id number]) ?-------------------------------------------------------------------------------- ?Multi-Disciplinary Care Plan Details ?Patient Name: Wogan, Talyn ?Date of Service: 02/02/2022 9:15 AM ?Medical Record Number: [id number] ?Patient Account Number: [id number] ?Date of Birth/Sex: 11/01/1955 (65 y.o. M) ?Treating RN: Bishop, Joy ?Primary Care : Fields, Glenda Other Clinician: ?Referring : Fields, Glenda ?Treating /Extender: Hoffman, Jessica ?Weeks in Treatment: 12 ?Active Inactive ?Wound/Skin Impairment ?Nursing Diagnoses: ?Impaired tissue integrity ?Knowledge deficit related to smoking impact on wound healing ?Knowledge deficit related to ulceration/compromised skin integrity ?Goals: ?Patient/caregiver will verbalize understanding of skin care regimen ?Date Initiated: 11/10/2021 ?Date Inactivated:  12/01/2021 ?Target Resolution Date: 11/17/2021 ?Goal Status: Met ?Ulcer/skin breakdown will have a volume reduction of 30% by week 4 ?Date Initiated: 11/10/2021 ?Date Inactivated: 02/02/2022 ?Target Resolution Date: 12/08/2021 ?Goal Status: Met ?Ulcer/skin breakdown will have a volume reduction of 50% by week 8 ?Date Initiated: 11/10/2021 ?Target Resolution Date: 01/05/2022 ?Goal Status: Active ?Ulcer/skin breakdown will have a volume reduction of 80% by week 12 ?Date Initiated: 11/10/2021 ?Target Resolution Date: 02/02/2022 ?Goal Status: Active ?Ulcer/skin breakdown will heal within 14 weeks ?Date Initiated: 11/10/2021 ?Target Resolution Date: 02/16/2022 ?Goal Status: Active ?Interventions: ?Assess patient/caregiver ability to obtain necessary supplies ?Assess patient/caregiver ability to perform ulcer/skin care regimen upon admission and as needed ?Assess ulceration(s) every visit ?Notes: ?Electronic Signature(s) ?Signed: 02/02/2022 9:14:49 AM By: Bishop, Joy ?Entered By: Bishop, Joy on 02/02/2022 09:10:45 ?Rispoli, Tyjon ([id number]) ?-------------------------------------------------------------------------------- ?Pain Assessment Details ?Patient Name: Neville, Roque ?Date of Service: 02/02/2022 9:15 AM ?Medical Record Number: [id number] ?Patient Account Number: [id number] ?Date of Birth/Sex: 05/25/1956 (65 y.o. M) ?Treating RN: Bishop, Joy ?Primary Care : Fields, Glenda Other Clinician: ?Referring : Fields, Glenda ?Treating /Extender: Hoffman, Jessica ?Weeks in Treatment: 12 ?Active Problems ?Location of Pain Severity and Description of Pain ?Patient Has Paino No ?Site Locations ?Rate the pain. ?Current Pain Level: 0 ?Pain Management and Medication ?Current Pain Management: ?Electronic Signature(s) ?Signed: 02/02/2022 9:14:49 AM By: Bishop, Joy ?Entered By: Bishop, Joy on 02/02/2022 09:06:26 ?Breslin, Malcom  ([id number]) ?-------------------------------------------------------------------------------- ?Patient/Caregiver Education Details ?Patient Name: Waner, Ceferino ?Date of Service: 02/02/2022 9:15 AM ?Medical Record Number: [id number] ?Patient Account Number: [id number] ?Date of Birth/Gender: 09/07/1956 (65 y.o. M) ?Trea

## 2022-02-09 ENCOUNTER — Encounter: Payer: Medicare Other | Admitting: Internal Medicine

## 2022-02-09 DIAGNOSIS — E11621 Type 2 diabetes mellitus with foot ulcer: Secondary | ICD-10-CM | POA: Diagnosis not present

## 2022-02-09 NOTE — Progress Notes (Signed)
KRISTOFF, COONRADT ([id number]) ?Visit Report for 02/09/2022 ?Arrival Information Details ?Patient Name: [name], [name] ?Date of Service: 02/09/2022 9:15 AM ?Medical Record Number: [id number] ?Patient Account Number: [id number] ?Date of Birth/Sex: May 18, 1956 (66 y.o. M) ?Treating RN: Donnamarie Poag ?Primary Care Paiten Boies: [name] Other Clinician: ?Referring Osaze Hubbert: [name] ?Treating Ryun Velez/Extender: Ricard Dillon ?Weeks in Treatment: 13 ?Visit Information History Since Last Visit ?Added or deleted any medications: No ?Patient Arrived: Ambulatory ?Had a fall or experienced change in No ?Arrival Time: 09:07 ?activities of daily living that may affect ?Accompanied By: self ?risk of falls: ?Transfer Assistance: None ?Hospitalized since last visit: No ?Patient Identification Verified: Yes ?Has Dressing in Place as Prescribed: Yes ?Secondary Verification Process Completed: Yes ?Pain Present Now: No ?Patient Requires Transmission-Based No ?Precautions: ?Patient Has Alerts: Yes ?Patient Alerts: Patient on Blood ?Thinner ?DIABETIC ?Plavix ?Electronic Signature(s) ?Signed: 02/09/2022 2:59:40 PM By: Donnamarie Poag ?Entered ByDonnamarie Poag on 02/09/2022 09:10:27 ?Iden, Boniface ([id number]) ?-------------------------------------------------------------------------------- ?Encounter Discharge Information Details ?Patient Name: [name] ?Date of Service: 02/09/2022 9:15 AM ?Medical Record Number: [id number] ?Patient Account Number: [id number] ?Date of Birth/Sex: 1956/05/02 (66 y.o. M) ?Treating RN: Donnamarie Poag ?Primary Care Darsh Vandevoort: [name] Other Clinician: ?Referring Emitt Maglione: [name] ?Treating Shauntelle Jamerson/Extender: Ricard Dillon ?Weeks in Treatment: 13 ?Encounter Discharge Information Items Post Procedure Vitals ?Discharge Condition: Stable ?Temperature (?F): 97.9 ?Ambulatory Status: Ambulatory ?Pulse (bpm): 91 ?Discharge Destination: Home ?Respiratory Rate (breaths/min): 16 ?Transportation: Private  Auto ?Blood Pressure (mmHg): 133/80 ?Accompanied By: self ?Schedule Follow-up Appointment: Yes ?Clinical Summary of Care: ?Electronic Signature(s) ?Signed: 02/09/2022 2:59:40 PM By: Donnamarie Poag ?Entered ByDonnamarie Poag on 02/09/2022 10:02:37 ?Fifer, Makyi ([id number]) ?-------------------------------------------------------------------------------- ?Lower Extremity Assessment Details ?Patient Name: [name] ?Date of Service: 02/09/2022 9:15 AM ?Medical Record Number: [id number] ?Patient Account Number: [id number] ?Date of Birth/Sex: 1956/09/10 (66 y.o. M) ?Treating RN: Donnamarie Poag ?Primary Care Aliscia Clayton: [name] Other Clinician: ?Referring Krislynn Gronau: [name] ?Treating Sitara Cashwell/Extender: Ricard Dillon ?Weeks in Treatment: 13 ?Edema Assessment ?Assessed: [Left: No] [Right: Yes] ?[Left: Edema] [Right: :] ?Ankle ?Left: Right: ?Point of Measurement: From Medial Instep 22.5 cm ?Vascular Assessment ?Pulses: ?Dorsalis Pedis ?Palpable: [Right:Yes] ?Electronic Signature(s) ?Signed: 02/09/2022 2:59:40 PM By: Donnamarie Poag ?Entered ByDonnamarie Poag on 02/09/2022 09:16:25 ?Bither, Prashant ([id number]) ?-------------------------------------------------------------------------------- ?Multi Wound Chart Details ?Patient Name: [name] ?Date of Service: 02/09/2022 9:15 AM ?Medical Record Number: [id number] ?Patient Account Number: [id number] ?Date of Birth/Sex: 1956-08-31 (66 y.o. M) ?Treating RN: Donnamarie Poag ?Primary Care Muhammad Vacca: [name] Other Clinician: ?Referring Chales Pelissier: [name] ?Treating Ilda Laskin/Extender: Ricard Dillon ?Weeks in Treatment: 13 ?Vital Signs ?Height(in): 74 ?Pulse(bpm): 91 ?Weight(lbs): 226 ?Blood Pressure(mmHg): 133/80 ?Body Mass Index(BMI): 29 ?Temperature(??F): 97.9 ?Respiratory Rate(breaths/min): 16 ?Photos: [N/A:N/A] ?Wound Location: Right, Plantar Foot N/A N/A ?Wounding Event: Blister N/A N/A ?Primary Etiology: Diabetic Wound/Ulcer of the Lower N/A  N/A ?Extremity ?Comorbid History: Type II Diabetes, Osteoarthritis N/A N/A ?Date Acquired: 07/15/2020 N/A N/A ?Weeks of Treatment: 13 N/A N/A ?Wound Status: Open N/A N/A ?Wound Recurrence: No N/A N/A ?Measurements L x W x D (cm) 1.1x0.5x0.4 N/A N/A ?Area (cm?) : 0.432 N/A N/A ?Volume (cm?) : 0.173 N/A N/A ?% Reduction in Area: 61.50% N/A N/A ?% Reduction in Volume: 74.30% N/A N/A ?Classification: Grade 2 N/A N/A ?Exudate Amount: Medium N/A N/A ?Exudate Type: Serosanguineous N/A N/A ?Exudate Color: red, brown N/A N/A ?Wound Margin: Thickened N/A N/A ?Granulation Amount: Large (67-100%) N/A N/A ?Granulation Quality: Red, Pink N/A N/A ?Necrotic Amount: Small (1-33%) N/A N/A ?Exposed Structures: ?Fat Layer (Subcutaneous Tissue): N/A N/A ?Yes ?Fascia:  No ?Tendon: No ?Muscle: No ?Joint: No ?Bone: No ?Epithelialization: None N/A N/A ?Treatment Notes ?Electronic Signature(s) ?Signed: 02/09/2022 2:59:40 PM By: Bishop, Joy ?Entered By: Bishop, Joy on 02/09/2022 09:17:03 ?Scarbro, Malon ([id number]) ?-------------------------------------------------------------------------------- ?Multi-Disciplinary Care Plan Details ?Patient Name: [name] ?Date of Service: 02/09/2022 9:15 AM ?Medical Record Number: [id number] ?Patient Account Number: [id number] ?Date of Birth/Sex: 11/28/1955 (65 y.o. M) ?Treating RN: Bishop, Joy ?Primary Care : Fields, Glenda Other Clinician: ?Referring : Fields, Glenda ?Treating /Extender: ROBSON, London G ?Weeks in Treatment: 13 ?Active Inactive ?Wound/Skin Impairment ?Nursing Diagnoses: ?Impaired tissue integrity ?Knowledge deficit related to smoking impact on wound healing ?Knowledge deficit related to ulceration/compromised skin integrity ?Goals: ?Patient/caregiver will verbalize understanding of skin care regimen ?Date Initiated: 11/10/2021 ?Date Inactivated: 12/01/2021 ?Target Resolution Date: 11/17/2021 ?Goal Status: Met ?Ulcer/skin breakdown will have a volume reduction of 30%  by week 4 ?Date Initiated: 11/10/2021 ?Date Inactivated: 02/02/2022 ?Target Resolution Date: 12/08/2021 ?Goal Status: Met ?Ulcer/skin breakdown will have a volume reduction of 50% by week 8 ?Date Initiated: 11/10/2021 ?Target Resolution Date: 01/05/2022 ?Goal Status: Active ?Ulcer/skin breakdown will have a volume reduction of 80% by week 12 ?Date Initiated: 11/10/2021 ?Target Resolution Date: 02/02/2022 ?Goal Status: Active ?Ulcer/skin breakdown will heal within 14 weeks ?Date Initiated: 11/10/2021 ?Target Resolution Date: 02/16/2022 ?Goal Status: Active ?Interventions: ?Assess patient/caregiver ability to obtain necessary supplies ?Assess patient/caregiver ability to perform ulcer/skin care regimen upon admission and as needed ?Assess ulceration(s) every visit ?Notes: ?Electronic Signature(s) ?Signed: 02/09/2022 2:59:40 PM By: Bishop, Joy ?Entered By: Bishop, Joy on 02/09/2022 09:16:50 ?Norgard, Ewel ([id number]) ?-------------------------------------------------------------------------------- ?Pain Assessment Details ?Patient Name: Smolinski, Lennart ?Date of Service: 02/09/2022 9:15 AM ?Medical Record Number: [id number] ?Patient Account Number: [id number] ?Date of Birth/Sex: 03/31/1956 (65 y.o. M) ?Treating RN: Bishop, Joy ?Primary Care : Fields, Glenda Other Clinician: ?Referring : Fields, Glenda ?Treating /Extender: ROBSON, Octavian G ?Weeks in Treatment: 13 ?Active Problems ?Location of Pain Severity and Description of Pain ?Patient Has Paino No ?Site Locations ?Rate the pain. ?Current Pain Level: 0 ?Pain Management and Medication ?Current Pain Management: ?Electronic Signature(s) ?Signed: 02/09/2022 2:59:40 PM By: Bishop, Joy ?Entered By: Bishop, Joy on 02/09/2022 09:11:30 ?Fiscus, Savaughn ([id number]) ?-------------------------------------------------------------------------------- ?Patient/Caregiver Education Details ?Patient Name: Farnan, Audrick ?Date of Service: 02/09/2022 9:15 AM ?Medical Record Number:  [id number] ?Patient Account Number: [id number] ?Date of Birth/Gender: 11/08/1955 (65 y.o. M) ?Treating RN: Bishop, Joy ?Primary Care Physician: Fields, Glenda Other Clinician: ?Referring Physician: Fields, Glenda ?Treating Physic

## 2022-02-10 NOTE — Progress Notes (Signed)
[name], Sylvan ([id number]) ?Visit Report for 02/09/2022 ?Debridement Details ?Patient Name: [name], [name] ?Date of Service: 02/09/2022 9:15 AM ?Medical Record Number: [id number] ?Patient Account Number: [id number] ?Date of Birth/Sex: August 01, 1956 [age] y.o. M) ?Treating RN: Hansel FeinsteinBishop, Joy ?Primary Care Provider: Manson AllanFields, Glenda Other Clinician: ?Referring Provider: Manson AllanFields, Glenda ?Treating Provider/Extender: Maxwell CaulOBSON, Winn G ?Weeks in Treatment: 13 ?Debridement Performed for ?Wound #1 Right,Plantar Foot ?Assessment: ?Performed By: Physician Maxwell CaulOBSON, Willim G, MD ?Debridement Type: Debridement ?Severity of Tissue Pre Debridement: Fat layer exposed ?Level of Consciousness (Pre- ?Awake and Alert ?procedure): ?Pre-procedure Verification/Time Out ?Yes - 09:41 ?Taken: ?Start Time: 09:42 ?Pain Control: Lidocaine ?Total Area Debrided (L x W): 1.3 (cm) x 0.7 (cm) = 0.91 (cm?) ?Tissue and other material ?Viable, Non-Viable, Callus, Slough, Subcutaneous, Skin: Dermis , Slough ?debrided: ?Level: Skin/Subcutaneous Tissue ?Debridement Description: Excisional ?Instrument: Curette ?Bleeding: Minimum ?Hemostasis Achieved: Pressure ?Response to Treatment: Procedure was tolerated well ?Level of Consciousness (Post- ?Awake and Alert ?procedure): ?Post Debridement Measurements of Total Wound ?Length: (cm) 1.1 ?Width: (cm) 0.5 ?Depth: (cm) 0.4 ?Volume: (cm?) 0.173 ?Character of Wound/Ulcer Post Debridement: Improved ?Severity of Tissue Post Debridement: Fat layer exposed ?Post Procedure Diagnosis ?Same as Pre-procedure ?Electronic Signature(s) ?Signed: 02/09/2022 2:59:40 PM By: Hansel FeinsteinBishop, Joy ?Signed: 02/10/2022 7:47:50 AM By: Baltazar Najjarobson, Myson MD ?Entered By: Baltazar Najjarobson, Michelangelo on 02/09/2022 09:53:35 ?[name], [name] ([id number]) ?-------------------------------------------------------------------------------- ?HPI Details ?Patient Name: [name], [name] ?Date of Service: 02/09/2022 9:15 AM ?Medical Record Number: [id number] ?Patient Account Number:  [id number] ?Date of Birth/Sex: August 01, 1956 [age] y.o. M) ?Treating RN: Hansel FeinsteinBishop, Joy ?Primary Care Provider: Manson AllanFields, Glenda Other Clinician: ?Referring Provider: Manson AllanFields, Glenda ?Treating Provider/Extender: Maxwell CaulOBSON, Maximiliano G ?Weeks in Treatment: 13 ?History of Present Illness ?HPI Description: Admission 11/10/2021 ?[name] is a 66 year old male with a past medical history of uncontrolled type 2 diabetes with last hemoglobin A1c of 8.1 complicated by ?peripheral neuropathy that presents to the clinic for a 2-year history of nonhealing ulcer to the bottom of his right foot. He states this started out ?as a blister caused by a work boot. He reports receiving wound care when [place] in [place]. He [place] [place]. He is currently keeping the area clean and covered. He has insoles designed to help offload the wound bed. He currently ?denies signs of infection. ?1/18; patient presents for follow-up. He has been using Hydrofera Blue to the wound bed. He has no issues or complaints today. He has not ?received the defender.[name]. He denies signs of infection. ?2/1; patient presents for follow-up. He has been using Hydrofera Blue to the wound bed he states he received the defender boot and has been ?using it however he did not have it on today. He currently denies signs of infection to the right foot. Unfortunately he developed a wound to the ?left great toe over the past week. He states he received new orthotics and these caused a blister to the left great toe which turned into a wound. ?He has not been doing anything to the wound bed. He currently denies systemic signs of infection. ?2/8; Patient presents for follow-up. Since last seen in the clinic he experienced a CVA and was hospitalized for 2 days. 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 He states he is about 90% recovered. He has some mild weakness to the left side. He  reports ?taking the antibiotics prescribed at last clinic visit. He reports using Hydrofera Blue for dressing changes. He only uses the offloading boot when he ?is  at home. He uses open toed shoes to the right foot. He currently denies signs of infection. ?2/15; patient presents for follow-up. He states he is still taking the antibiotics prescribed. He reports using the defender boot to the right foot and ?open toed shoe to the left foot however he is not wearing either today. He currently denies systemic signs of infection. He stated he cut down a ?tree last week and was outside doing yard work. ?2/22; patient presents for follow-up. 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 He reports feeling well. He reports ?using the defender boot to the right leg and continues to use Hydrofera Blue with dressing changes. He denies systemic signs of infection. ?3/8; patient presents for follow-up. He sees Dr. Alberteen Spindle tomorrow for follow-up of the left great toe amputation. This was a partial amputation. He ?reports he is taking Augmentin currently. He reports increased erythema to the left great toe amputation site. He also reports a decline in his right ?plantar foot wound. He reports it is draining more. He denies purulent drainage. ?3/15; patient presents for follow-up. He did not see Dr. Alberteen Spindle last week but states he sees him today. He states he has been taking doxycycline in ?the past week and started gentamicin ointment. He continues to use [facility] with dressing changes. He currently denies systemic signs of ?infection. ?3/22; patient presents for follow-up. Patient had partial amputation of the left great toe on 2/15. 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 Alberteen Spindle. He reports no issues and has no complaints. He is currently taking Keflex and doxycycline prescribed by Dr. Alberteen Spindle. ?He continues to use gentamicin ointment and Hydrofera Blue to the right foot  wound. He reports using the defender boot when he is at home. ?3/29; patient presents for follow-up. He continues to be on antibiotics per podiatry. He has been using Hydrofera Blue and gentamicin ointment to ?the right plantar foot wound. He has no issues or complaints today. He denies signs of infection. ?4/5; patient presents for follow-up. He has been using Dakin's wet-to-dry dressings with improvement to wound healing. He denies signs of ?infection. She has no issues or complaints today. 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 Use Hydrofera Blue for a prolonged period of time and has been using Dakin's wet- ?to-dry I think for the last 2 to 3 weeks. He has a Psychologist, forensic at home but he does not wear that coming into the clinic because he drives. He ?tells me he has been compliant with the defender boot short of when he goes out to drive. He lives alone. He also tells me he has had a previous ?left great toe amputation ?Electronic Signature(s) ?Signed: 02/10/2022 7:47:50 AM By: Baltazar Najjar MD ?Entered By: Baltazar Najjar on 02/09/2022 09:50:16 ?Saldarriaga, [name] ([id number]) ?-------------------------------------------------------------------------------- ?Physical Exam Details ?Patient Name: [name], [name] ?Date of Service: 02/09/2022 9:15 AM ?Medical Record Number: [id number] ?Patient Account Number: [id number] ?Date of Birth/Sex: 07-10-1956 (66 y.o. M) ?Treating RN: Hansel Feinstein ?Primary Care Provider: Manson Allan Other Clinician: ?Referring Provider: Manson Allan ?Treating Provider/Extender: Maxwell Caul ?Weeks in Treatment: 13 ?Constitutional ?Sitting or standing Blood Pressure is within target range for patient.. Pulse regular and within target range for patient.[name] Respirations regular, non- ?labored and within target range.. Temperature is normal and within the target range for the patient.[name] appears in no distress. ?Notes ?Wound exam; right plantar first metatarsal head.  Small wound with a reasonable  surface however thick raised edges around the wound. I used a ?#5 curette to remove these some fibrinous debris on the wound surface with minimal bleeding. This cleans up quite nicely and the wound l

## 2022-02-16 ENCOUNTER — Encounter: Payer: Medicare Other | Admitting: Internal Medicine

## 2022-02-23 ENCOUNTER — Encounter (HOSPITAL_BASED_OUTPATIENT_CLINIC_OR_DEPARTMENT_OTHER): Payer: Medicare Other | Admitting: Internal Medicine

## 2022-02-23 DIAGNOSIS — E11621 Type 2 diabetes mellitus with foot ulcer: Secondary | ICD-10-CM

## 2022-02-23 DIAGNOSIS — E1140 Type 2 diabetes mellitus with diabetic neuropathy, unspecified: Secondary | ICD-10-CM

## 2022-02-23 DIAGNOSIS — L97512 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ed: Secondary | ICD-10-CM | POA: Diagnosis not present

## 2022-02-23 NOTE — Progress Notes (Signed)
[name], [name] ([id number]) ?Visit Report for 02/23/2022 ?Chief Complaint Document Details ?Patient Name: [name], [name] ?Date of Service: 02/23/2022 11:00 AM ?Medical Record Number: [id number] ?Patient Account Number: [id number] ?Date of Birth/Sex: [birth year](65 y.o. M) ?Treating RN: Yevonne PaxEpps, Carrie ?Primary Care Provider: Manson AllanFields, Glenda Other Clinician: ?Referring Provider: Manson AllanFields, Glenda ?Treating Provider/Extender: Geralyn CorwinHoffman, Abrielle Finck ?Weeks in Treatment: 15 ?Information Obtained from: Patient ?Chief Complaint ?Right plantar foot wound ?Electronic Signature(s) ?Signed: 02/23/2022 5:26:07 PM By: Geralyn CorwinHoffman, Raffaele Derise DO ?Entered By: Geralyn CorwinHoffman, Aniqa Hare on 02/23/2022 16:04:22 ?[name], [name] ([id number]) ?-------------------------------------------------------------------------------- ?HPI Details ?Patient Name: [name], [name] ?Date of Service: 02/23/2022 11:00 AM ?Medical Record Number: [id number] ?Patient Account Number: [id number] ?Date of Birth/Sex: [birth year](65 y.o. M) ?Treating RN: Yevonne PaxEpps, Carrie ?Primary Care Provider: Manson AllanFields, Glenda Other Clinician: ?Referring Provider: Manson AllanFields, Glenda ?Treating Provider/Extender: Geralyn CorwinHoffman, Johnnetta Holstine ?Weeks in Treatment: 15 ?History of Present Illness ?HPI Description: Admission 11/10/2021 ?[name] is a 66 year old male with a past medical history of uncontrolled type 2 diabetes with last hemoglobin A1c of 8.1 complicated by ?peripheral neuropathy that presents to the clinic for a 2-year history of nonhealing ulcer to the bottom of his right foot. He states this started out ?as a blister caused by a work boot. He reports receiving wound care when he resided in [place]. He is re[place] [facility] [place] [place] today. He is currently keeping the area clean and covered. He has insoles designed to help offload the wound bed. He currently ?denies signs of infection. ?1/18; patient presents for follow-up. He has been using Hydrofera Blue to the wound bed. He has no issues  or complaints today. He has not ?received the defender.[name]. He denies signs of infection. ?2/1; patient presents for follow-up. He has been using Hydrofera Blue to the wound bed he states he received the defender boot and has been ?using it however he did not have it on today. He currently denies signs of infection to the right foot. Unfortunately he developed a wound to the ?left great toe over the past week. He states he received new orthotics and these caused a blister to the left great toe which turned into a wound. ?He has not been doing anything to the wound bed. He currently denies systemic signs of infection. ?2/8; Patient presents for follow-up. Since last seen in the clinic he experienced a CVA and was hospitalized for 2 days. 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 He states he is about 90% recovered. He has some mild weakness to the left side. He reports ?taking the antibiotics prescribed at last clinic visit. He reports using Hydrofera Blue for dressing changes. He only uses the offloading boot when he ?is at home. He uses open toed shoes to the right foot. He currently denies signs of infection. ?2/15; patient presents for follow-up. He states he is still taking the antibiotics prescribed. He reports using the defender boot to the right foot and ?open toed shoe to the left foot however he is not wearing either today. He currently denies systemic signs of infection. He stated he cut down a ?tree last week and was outside doing yard work. ?2/22; patient presents for follow-up. 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 He reports feeling well. He reports ?using the defender boot to the right leg and continues to use Hydrofera Blue with dressing changes. He denies systemic signs of infection. ?3/8; patient presents for follow-up. He sees Dr. Alberteen Spindleline tomorrow for follow-up of the left  great toe amputation. This was a partial amputation.  He ?reports he is taking Augmentin currently. He reports increased erythema to the left great toe amputation site. He also reports a decline in his right ?plantar foot wound. He reports it is draining more. He denies purulent drainage. ?3/15; patient presents for follow-up. He did not see Dr. Alberteen Spindle last week but states he sees him today. He states he has been taking doxycycline in ?the past week and started gentamicin ointment. He continues to use [facility] with dressing changes. He currently denies systemic signs of ?infection. ?3/22; patient presents for follow-up. Patient had partial amputation of the left great toe on 2/15. 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 Alberteen Spindle. He reports no issues and has no complaints. He is currently taking Keflex and doxycycline prescribed by Dr. Alberteen Spindle. ?He continues to use gentamicin ointment and Hydrofera Blue to the right foot wound. He reports using the defender boot when he is at home. ?3/29; patient presents for follow-up. He continues to be on antibiotics per podiatry. He has been using Hydrofera Blue and gentamicin ointment to ?the right plantar foot wound. He has no issues or complaints today. He denies signs of infection. ?4/5; patient presents for follow-up. He has been using Dakin's wet-to-dry dressings with improvement to wound healing. He denies signs of ?infection. She has no issues or complaints today. 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 Use Hydrofera Blue for a prolonged period of time and has been using Dakin's wet- ?to-dry I think for the last 2 to 3 weeks. He has a Psychologist, forensic at home but he does not wear that coming into the clinic because he drives. He ?tells me he has been compliant with the defender boot short of when he goes out to drive. He lives alone. He also tells me he has had a previous ?left great toe amputation ?We are following him for the right foot wound. ?4/26; patient presents  for follow-up. He had worsening of his left foot wound and had surgical debridement on 4/16 by Dr. Fanny Skates. He is being ?followed by podiatry for this issue. He he is on IV Unasyn based on tissue culture by ID.[name] We are following him for the right foot wound. He has ?been using Dakin's wet-to-dry dressings without issues. He reports continued usage of the defender boot. ?Electronic Signature(s) ?Signed: 02/23/2022 5:26:07 PM By: Geralyn Corwin DO ?Entered By: Geralyn Corwin on 02/23/2022 17:16:46 ?[name], [name] ([id number]) ?-------------------------------------------------------------------------------- ?Physical Exam Details ?Patient Name: [name], [name] ?Date of Service: 02/23/2022 11:00 AM ?Medical Record Number: [id number] ?Patient Account Number: [id number] ?Date of Birth/Sex: 01-11-1956 (66 y.o. M) ?Treating RN: Yevonne Pax ?Primary Care Provider: Manson Allan Other Clinician: ?Referring Provider: Manson Allan ?Treating Provider/Extender: Geralyn Corwin ?Weeks in Treatment: 15 ?Constitutional ?. ?Cardiovascular ?[name] ?Psychiatric ?[name] ?Notes ?Right foot:: To the plantar aspect of the first metatarsal head there is an open wound with granulation tissue and undermining from the 12 to 6 ?o'clock position. No tunneling noted. Appears well-healing. ?Electronic Signature(s) ?Signed: 02/23/2022 5:26:07 PM By: Geralyn Corwin DO ?Entered By: Geralyn Corwin on 02/23/2022 17:17:26 ?[name], [name] ([id number]) ?-------------------------------------------------------------------------------- ?Physician Orders Details ?Patient Name: [name], [name] ?Date of Service: 02/23/2022 11:00 AM ?Medical Record Number: [id number] ?Patient Account Number: [id number] ?Date of Birth/Sex: [birth year] (66 y.o. M) ?Treating RN: Yevonne Pax ?Primary Care Provider: Manson Allan Other Clinician: ?Referring Provider: Manson Allan ?Treating Provider/Extender: Geralyn Corwin ?Weeks in Treatment: 15 ?Verbal / Phone Orders: No ?Diagnosis  Coding ?Follow-up Appointments ?o Return Appointment in 1 week. ?o Nurse Visit as needed ?Home Health ?o CONTINUE Home Health for wound care. May utilize formulary equivalent dressing for wound treatment

## 2022-02-25 NOTE — Progress Notes (Signed)
KAWHI, DIEBOLD ([id number]) ?Visit Report for 02/23/2022 ?Arrival Information Details ?Patient Name: [name], [name] ?Date of Service: 02/23/2022 11:00 AM ?Medical Record Number: [id number] ?Patient Account Number: [id number] ?Date of Birth/Sex: [birth year] (66 y.o. M) ?Treating RN: Yevonne Pax ?Primary Care Doroteo Nickolson: Manson Allan Other Clinician: ?Referring Nayra Coury: Manson Allan ?Treating Safwan Tomei/Extender: Geralyn Corwin ?Weeks in Treatment: 15 ?Visit Information History Since Last Visit ?All ordered tests and consults were completed: No ?Patient Arrived: Ambulatory ?Added or deleted any medications: No ?Arrival Time: 11:01 ?Any new allergies or adverse reactions: No ?Accompanied By: self ?Had a fall or experienced change in No ?Transfer Assistance: None ?activities of daily living that may affect ?Patient Identification Verified: Yes ?risk of falls: ?Secondary Verification Process Completed: Yes ?Signs or symptoms of abuse/neglect since last visito No ?Patient Requires Transmission-Based No ?Hospitalized since last visit: No ?Precautions: ?Implantable device outside of the clinic excluding No ?Patient Has Alerts: Yes ?cellular tissue based products placed in the center ?Patient Alerts: Patient on Blood ?since last visit: ?Thinner ?Has Dressing in Place as Prescribed: Yes ?DIABETIC ?Pain Present Now: No ?Plavix ?Electronic Signature(s) ?Signed: 02/25/2022 1:52:28 PM By: Yevonne Pax RN ?Entered By: Yevonne Pax on 02/23/2022 11:05:36 ?[name], [name] ([id number]) ?-------------------------------------------------------------------------------- ?Clinic Level of Care Assessment Details ?Patient Name: [name], [name] ?Date of Service: 02/23/2022 11:00 AM ?Medical Record Number: [id number] ?Patient Account Number: [id number] ?Date of Birth/Sex: [birth year] (66 y.o. M) ?Treating RN: Yevonne Pax ?Primary Care Leiam Hopwood: Manson Allan Other Clinician: ?Referring Hedda Crumbley: Manson Allan ?Treating Maxemiliano Riel/Extender: Geralyn Corwin ?Weeks in Treatment: 15 ?Clinic Level of Care Assessment Items ?TOOL 4 Quantity Score ?X - Use when only an EandM is performed on FOLLOW-UP visit 1 0 ?ASSESSMENTS - Nursing Assessment / Reassessment ?X - Reassessment of Co-morbidities (includes updates in patient status) 1 10 ?X- 1 5 ?Reassessment of Adherence to Treatment Plan ?ASSESSMENTS - Wound and Skin Assessment / Reassessment ?X - Simple Wound Assessment / Reassessment - one wound 1 5 ?[]  - 0 ?Complex Wound Assessment / Reassessment - multiple wounds ?[]  - 0 ?Dermatologic / Skin Assessment (not related to wound area) ?ASSESSMENTS - Focused Assessment ?[]  - Circumferential Edema Measurements - multi extremities 0 ?[]  - 0 ?Nutritional Assessment / Counseling / Intervention ?[]  - 0 ?Lower Extremity Assessment (monofilament, tuning fork, pulses) ?[]  - 0 ?Peripheral Arterial Disease Assessment (using hand held doppler) ?ASSESSMENTS - Ostomy and/or Continence Assessment and Care ?[]  - Incontinence Assessment and Management 0 ?[]  - 0 ?Ostomy Care Assessment and Management (repouching, etc.) ?PROCESS - Coordination of Care ?X - Simple Patient / Family Education for ongoing care 1 15 ?[]  - 0 ?Complex (extensive) Patient / Family Education for ongoing care ?[]  - 0 ?Staff obtains Consents, Records, Test Results / Process Orders ?[]  - 0 ?Staff telephones HHA, Nursing Homes / Clarify orders / etc ?[]  - 0 ?Routine Transfer to another Facility (non-emergent condition) ?[]  - 0 ?Routine Hospital Admission (non-emergent condition) ?[]  - 0 ?New Admissions / / Ordering NPWT, Apligraf, etc. ?[]  - 0 ?Emergency Hospital Admission (emergent condition) ?X- 1 10 ?Simple Discharge Coordination ?[]  - 0 ?Complex (extensive) Discharge Coordination ?PROCESS - Special Needs ?[]  - Pediatric / Minor Patient Management 0 ?[]  - 0 ?Isolation Patient Management ?[]  - 0 ?Hearing / Language / Visual special needs ?[]  - 0 ?Assessment of Community assistance  (transportation, D/C planning, etc.) ?[]  - 0 ?Additional assistance / Altered mentation ?[]  - 0 ?Support Surface(s) Assessment (bed, cushion, seat, etc.) ?INTERVENTIONS - Wound Cleansing / Measurement ?[name], [name] ( ) ?X- 1 5 ?Simple  Wound Cleansing - one wound ?[]  - 0 ?Complex Wound Cleansing - multiple wounds ?X- 1 5 ?Wound Imaging (photographs - any number of wounds) ?[]  - 0 ?Wound Tracing (instead of photographs) ?X- 1 5 ?Simple Wound Measurement - one wound ?[]  - 0 ?Complex Wound Measurement - multiple wounds ?INTERVENTIONS - Wound Dressings ?X - Small Wound Dressing one or multiple wounds 1 10 ?[]  - 0 ?Medium Wound Dressing one or multiple wounds ?[]  - 0 ?Large Wound Dressing one or multiple wounds ?[]  - 0 ?Application of Medications - topical ?[]  - 0 ?Application of Medications - injection ?INTERVENTIONS - Miscellaneous ?[]  - External ear exam 0 ?[]  - 0 ?Specimen Collection (cultures, biopsies, blood, body fluids, etc.) ?[]  - 0 ?Specimen(s) / Culture(s) sent or taken to Lab for analysis ?[]  - 0 ?Patient Transfer (multiple staff / / Similar devices) ?[]  - 0 ?Simple Staple / Suture removal (25 or less) ?[]  - 0 ?Complex Staple / Suture removal (26 or more) ?[]  - 0 ?Hypo / Hyperglycemic Management (close monitor of Blood Glucose) ?[]  - 0 ?Ankle / Brachial Index (ABI) - do not check if billed separately ?X- 1 5 ?Vital Signs ?Has the patient been seen at the hospital within the last three years: Yes ?Total Score: 75 ?Level Of Care: New/Established - Level ?2 ?Electronic Signature(s) ?Signed: 02/25/2022 1:52:28 PM By: RN ?Entered By: on 02/23/2022 11:59:47 ?[name], [name] ( ) ?-------------------------------------------------------------------------------- ?Encounter Discharge Information Details ?Patient Name: [name], [name] ?Date of Service: 02/23/2022 11:00 AM ?Medical Record Number: ?Patient Account Number: ?Date of Birth/Sex: 07/01/1956  (66 y.o. M) ?Treating RN: ?Primary Care Zaryan Yakubov: Other Clinician: ?Referring Ahonesty Woodfin: ?Treating Irma Delancey/Extender: ?Weeks in Treatment: 15 ?Encounter Discharge Information Items ?Discharge Condition: Stable ?Ambulatory Status: Ambulatory ?Discharge Destination: Home ?Transportation: Private Auto ?Accompanied By: self ?Schedule Follow-up Appointment: Yes ?Clinical Summary of Care: Patient Declined ?Electronic Signature(s) ?Signed: 02/23/2022 12:01:01 PM By: Yevonne Pax RN ?Entered By: Yevonne Pax on 02/23/2022 12:01:00 ?[name], [name] ([id number]) ?-------------------------------------------------------------------------------- ?Lower Extremity Assessment Details ?Patient Name: [name], [name] ?Date of Service: 02/23/2022 11:00 AM ?Medical Record Number: [id number] ?Patient Account Number: [id number] ?Date of Birth/Sex: April 24, 1956 (66 y.o. M) ?Treating RN: Yevonne Pax ?Primary Care Necola Bluestein: Manson Allan Other Clinician: ?Referring Lyriq Finerty: Manson Allan ?Treating Marion Seese/Extender: Geralyn Corwin ?Weeks in Treatment: 15 ?Edema Assessment ?Assessed: [Left: No] [Right: No] ?Edema: [Left: N] [Right: o] ?Calf ?Left: Right: ?Point of Measurement: 40 cm From Medial Instep 36 cm ?Ankle ?Left: Right: ?Point of Measurement: 15 cm From Medial Instep 23 cm ?Knee To Floor ?Left: Right: ?From Medial Instep 54 cm ?Vascular Assessment ?Pulses: ?Dorsalis Pedis ?Palpable: [Right:Yes] ?Electronic Signature(s) ?Signed: 02/25/2022 1:52:28 PM By: Yevonne Pax RN ?Entered By: Yevonne Pax on 02/23/2022 11:13:07 ?[name], [name] ([id number]) ?-------------------------------------------------------------------------------- ?Multi Wound Chart Details ?Patient Name: [name], [name] ?Date of Service: 02/23/2022 11:00 AM ?Medical Record Number: [id number] ?Patient Account Number: [id number] ?Date of Birth/Sex: Sep 21, 1956 (66 y.o. M) ?Treating RN: Yevonne Pax ?Primary Care Phila Shoaf:  Manson Allan Other Clinician: ?Referring Denario Bagot: Manson Allan ?Treating Leeanne Butters/Extender: Geralyn Corwin ?Weeks in Treatment: 15 ?Vital Signs ?Height(in): 74 ?Pulse(bpm): 91 ?Weight(lbs): 226 ?Blood Pressur

## 2022-03-02 ENCOUNTER — Encounter: Payer: Medicare Other | Attending: Internal Medicine | Admitting: Internal Medicine

## 2022-03-02 DIAGNOSIS — R2689 Other abnormalities of gait and mobility: Secondary | ICD-10-CM | POA: Diagnosis not present

## 2022-03-02 DIAGNOSIS — E1142 Type 2 diabetes mellitus with diabetic polyneuropathy: Secondary | ICD-10-CM | POA: Insufficient documentation

## 2022-03-02 DIAGNOSIS — E11621 Type 2 diabetes mellitus with foot ulcer: Secondary | ICD-10-CM

## 2022-03-02 DIAGNOSIS — E1165 Type 2 diabetes mellitus with hyperglycemia: Secondary | ICD-10-CM | POA: Insufficient documentation

## 2022-03-02 DIAGNOSIS — Z89412 Acquired absence of left great toe: Secondary | ICD-10-CM | POA: Diagnosis not present

## 2022-03-02 DIAGNOSIS — L97512 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ed: Secondary | ICD-10-CM

## 2022-03-02 DIAGNOSIS — L97419 Non-pressure chronic ulcer of right heel and midfoot with unspecified severity: Secondary | ICD-10-CM | POA: Diagnosis not present

## 2022-03-03 NOTE — Progress Notes (Signed)
RIDGE, LAFOND ([id number]) ?Visit Report for 03/02/2022 ?Chief Complaint Document Details ?Patient Name: [name], [name] ?Date of Service: 03/02/2022 12:30 PM ?Medical Record Number: [id number] ?Patient Account Number: [id number] ?Date of Birth/Sex: June 29, 1956 (66 y.o. M) ?Treating RN: Huel Coventry ?Primary Care Provider: Manson Allan Other Clinician: ?Referring Provider: Manson Allan ?Treating Provider/Extender: Geralyn Corwin ?Weeks in Treatment: 16 ?Information Obtained from: Patient ?Chief Complaint ?Right plantar foot wound ?Electronic Signature(s) ?Signed: 03/02/2022 2:19:50 PM By: Geralyn Corwin DO ?Entered By: Geralyn Corwin on 03/02/2022 13:15:46 ?Bushong, Parag ([id number]) ?-------------------------------------------------------------------------------- ?Debridement Details ?Patient Name: [name], [name] ?Date of Service: 03/02/2022 12:30 PM ?Medical Record Number: [id number] ?Patient Account Number: [id number] ?Date of Birth/Sex: November 20, 1955 (66 y.o. M) ?Treating RN: Huel Coventry ?Primary Care Provider: Manson Allan Other Clinician: ?Referring Provider: Manson Allan ?Treating Provider/Extender: Geralyn Corwin ?Weeks in Treatment: 16 ?Debridement Performed for ?Wound #1 Right,Plantar Foot ?Assessment: ?Performed By: Physician Geralyn Corwin, MD ?Debridement Type: Debridement ?Severity of Tissue Pre Debridement: Fat layer exposed ?Level of Consciousness (Pre- ?Awake and Alert ?procedure): ?Pre-procedure Verification/Time Out ?Yes - 13:08 ?Taken: ?Total Area Debrided (L x W): 0.8 (cm) x 0.9 (cm) = 0.72 (cm?) ?Tissue and other material ?Viable, Non-Viable, Callus, Slough, Subcutaneous, Slough ?debrided: ?Level: Skin/Subcutaneous Tissue ?Debridement Description: Excisional ?Instrument: Curette ?Bleeding: Minimum ?Hemostasis Achieved: Pressure ?Response to Treatment: Procedure was tolerated well ?Level of Consciousness (Post- ?Awake and Alert ?procedure): ?Post Debridement Measurements of Total Wound ?Length:  (cm) 0.8 ?Width: (cm) 0.9 ?Depth: (cm) 0.5 ?Volume: (cm?) 0.283 ?Character of Wound/Ulcer Post Debridement: Stable ?Severity of Tissue Post Debridement: Fat layer exposed ?Post Procedure Diagnosis ?Same as Pre-procedure ?Electronic Signature(s) ?Signed: 03/02/2022 2:19:50 PM By: Geralyn Corwin DO ?Signed: 03/03/2022 5:29:17 PM By: Elliot Gurney, BSN, RN, CWS, Kim RN, BSN ?Entered By: Elliot Gurney, BSN, RN, CWS, Kim on 03/02/2022 13:09:29 ?Lanius, Morgen ([id number]) ?-------------------------------------------------------------------------------- ?HPI Details ?Patient Name: [name], [name] ?Date of Service: 03/02/2022 12:30 PM ?Medical Record Number: [id number] ?Patient Account Number: [id number] ?Date of Birth/Sex: October 21, 1956 (66 y.o. M) ?Treating RN: Huel Coventry ?Primary Care Provider: Manson Allan Other Clinician: ?Referring Provider: Manson Allan ?Treating Provider/Extender: Geralyn Corwin ?Weeks in Treatment: 16 ?History of Present Illness ?HPI Description: Admission 11/10/2021 ?Mr. [name] is a 66 year old male with a past medical history of uncontrolled type 2 diabetes with last hemoglobin A1c of 8.1 complicated by ?peripheral neuropathy that presents to the clinic for a 2-year history of nonhealing ulcer to the bottom of his right foot. He states this started out ?as a blister caused by a work boot. He reports receiving wound care when he resided in [place]. He is reestablishing his wound care in [place] [facility] [place] today. He is currently keeping the area clean and covered. He has insoles designed to help offload the wound bed. He currently ?denies signs of infection. ?1/18; patient presents for follow-up. He has been using Hydrofera Blue to the wound bed. He has no issues or complaints today. He has not ?received the defender.[name] He denies signs of infection. ?2/1; patient presents for follow-up. He has been using Hydrofera Blue to the wound bed he states he received the defender boot and has been ?using it however  he did not have it on today. He currently denies signs of infection to the right foot. Unfortunately he developed a wound to the ?left great toe over the past week. He states he received new orthotics and these caused a blister to the left great toe which turned into a wound. ?He has not been doing anything to the wound bed. He currently denies systemic signs  of infection. ?2/8; Patient presents for follow-up. Since last seen in the clinic he experienced a CVA and was hospitalized for 2 days. 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 He states he is about 90% recovered. He has some mild weakness to the left side. He reports ?taking the antibiotics prescribed at last clinic visit. He reports using Hydrofera Blue for dressing changes. He only uses the offloading boot when he ?is at home. He uses open toed shoes to the right foot. He currently denies signs of infection. ?2/15; patient presents for follow-up. He states he is still taking the antibiotics prescribed. He reports using the defender boot to the right foot and ?open toed shoe to the left foot however he is not wearing either today. He currently denies systemic signs of infection. He stated he cut down a ?tree last week and was outside doing yard work. ?2/22; patient presents for follow-up. 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 He reports feeling well. He reports ?using the defender boot to the right leg and continues to use Hydrofera Blue with dressing changes. He denies systemic signs of infection. ?3/8; patient presents for follow-up. He sees Dr. Alberteen Spindleline tomorrow for follow-up of the left great toe amputation. This was a partial amputation. He ?reports he is taking Augmentin currently. He reports increased erythema to the left great toe amputation site. He also reports a decline in his right ?plantar foot wound. He reports it is draining more. He denies purulent drainage. ?3/15; patient  presents for follow-up. He did not see Dr. Alberteen Spindleline last week but states he sees him today. He states he has been taking doxycycline in ?the past week and started gentamicin ointment. He [facility] dressing changes. He currently denies systemic signs of ?infection. ?3/22; patient presents for follow-up. Patient had partial amputation of the left great toe on 2/15. 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 Alberteen Spindleline. He reports no issues and has no complaints. He is currently taking Keflex and doxycycline prescribed by Dr. Alberteen Spindleline. ?He continues to use gentamicin ointment and Hydrofera Blue to the right foot wound. He reports using the defender boot when he is at home. ?3/29; patient presents for follow-up. He continues to be on antibiotics per podiatry. He has been using Hydrofera Blue and gentamicin ointment to ?the right plantar foot wound. He has no issues or complaints today. He denies signs of infection. ?4/5; patient presents for follow-up. He has been using Dakin's wet-to-dry dressings with improvement to wound healing. He denies signs of ?infection. She has no issues or complaints today. 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 Use Hydrofera Blue for a prolonged period of time and has been using Dakin's wet- ?to-dry I think for the last 2 to 3 weeks. He has a Psychologist, forensicdefender boot at home but he does not wear that coming into the clinic because he drives. He ?tells me he has been compliant with the defender boot short of when he goes out to drive. He lives alone. He also tells me he has had a previous ?left great toe amputation ?We are following him for the right foot wound. ?4/26; patient presents for follow-up. He had worsening of his left foot wound and had surgical debridement on 4/16 by Dr. Fanny SkatesSutherland. He is being ?followed by podiatry for this issue. He he is on IV Unasyn based on tissue culture by  ID.. We are following him for the right  foot wound. He has ?been using Dakin's wet-to-dry dressings without issues. He reports continued usage of the defender boot. ?5/30; patient presents for follow-up. He has been using Dakin's wet-to-dry dressin

## 2022-03-03 NOTE — Progress Notes (Signed)
CODEE, TUTSON ([id number]) ?Visit Report for 03/02/2022 ?Arrival Information Details ?Patient Name: [name], [name] ?Date of Service: 03/02/2022 12:30 PM ?Medical Record Number: [id number] ?Patient Account Number: [id number] ?Date of Birth/Sex: 1955-12-02 (66 y.o. M) ?Treating RN: Cornell Barman ?Primary Care Cyprian Gongaware: [name] Other Clinician: ?Referring Keshaun Dubey: [name] ?Treating Jillayne Witte/Extender: Kalman Shan ?Weeks in Treatment: 16 ?Visit Information History Since Last Visit ?Added or deleted any medications: No ?Patient Arrived: Ambulatory ?Pain Present Now: Yes ?Arrival Time: 12:47 ?Accompanied By: self ?Transfer Assistance: None ?Patient Identification Verified: Yes ?Secondary Verification Process Completed: Yes ?Patient Requires Transmission-Based No ?Precautions: ?Patient Has Alerts: Yes ?Patient Alerts: Patient on Blood ?Thinner ?DIABETIC ?Plavix ?Electronic Signature(s) ?Signed: 03/03/2022 5:29:17 PM By: Gretta Cool, BSN, RN, CWS, Kim RN, BSN ?Entered By: Gretta Cool, BSN, RN, CWS, Kim on 03/02/2022 12:47:42 ?Brancato, Bradrick ([id number]) ?-------------------------------------------------------------------------------- ?Encounter Discharge Information Details ?Patient Name: [name], [name] ?Date of Service: 03/02/2022 12:30 PM ?Medical Record Number: [id number] ?Patient Account Number: [id number] ?Date of Birth/Sex: 09-05-1956 (66 y.o. M) ?Treating RN: Cornell Barman ?Primary Care Gracey Tolle: [name] Other Clinician: ?Referring Lawan Nanez: [name] ?Treating Dejour Vos/Extender: Kalman Shan ?Weeks in Treatment: 16 ?Encounter Discharge Information Items Post Procedure Vitals ?Discharge Condition: Stable ?Unable to obtain vitals Reason: limited time ?Ambulatory Status: Ambulatory ?Discharge Destination: Home ?Transportation: Private Auto ?Schedule Follow-up Appointment: Yes ?Clinical Summary of Care: ?Electronic Signature(s) ?Signed: 03/03/2022 5:29:17 PM By: Gretta Cool, BSN, RN, CWS, Kim RN, BSN ?Entered By: Gretta Cool,  BSN, RN, CWS, Kim on 03/02/2022 13:29:46 ?Bricco, Narek ([id number]) ?-------------------------------------------------------------------------------- ?Lower Extremity Assessment Details ?Patient Name: [name], [name] ?Date of Service: 03/02/2022 12:30 PM ?Medical Record Number: [id number] ?Patient Account Number: [id number] ?Date of Birth/Sex: [birth year] (66 y.o. M) ?Treating RN: Cornell Barman ?Primary Care Brittanya Winburn: [name] Other Clinician: ?Referring Rosena Bartle: [name] ?Treating Sydell Prowell/Extender: Kalman Shan ?Weeks in Treatment: 16 ?Edema Assessment ?Assessed: [Left: No] [Right: Yes] ?Edema: [Left: N] [Right: o] ?Vascular Assessment ?Pulses: ?Dorsalis Pedis ?Palpable: [Right:Yes] ?Electronic Signature(s) ?Signed: 03/03/2022 5:29:17 PM By: Gretta Cool, BSN, RN, CWS, Kim RN, BSN ?Entered By: Gretta Cool, BSN, RN, CWS, Kim on 03/02/2022 12:54:24 ?Lutterman, Isaah ([id number]) ?-------------------------------------------------------------------------------- ?Multi Wound Chart Details ?Patient Name: [name], [name] ?Date of Service: 03/02/2022 12:30 PM ?Medical Record Number: [id number] ?Patient Account Number: [id number] ?Date of Birth/Sex: 04-30-1956 (66 y.o. M) ?Treating RN: Cornell Barman ?Primary Care Alaiah Lundy: [name] Other Clinician: ?Referring Lindzee Gouge: [name] ?Treating Liana Camerer/Extender: Kalman Shan ?Weeks in Treatment: 16 ?Vital Signs ?Height(in): 74 ?Pulse(bpm): 97 ?Weight(lbs): 226 ?Blood Pressure(mmHg): 98/61 ?Body Mass Index(BMI): 29 ?Temperature(??F): 97.7 ?Respiratory Rate(breaths/min): 18 ?Photos: [N/A:N/A] ?Wound Location: Right, Plantar Foot N/A N/A ?Wounding Event: Blister N/A N/A ?Primary Etiology: Diabetic Wound/Ulcer of the Lower N/A N/A ?Extremity ?Comorbid History: Type II Diabetes, Osteoarthritis N/A N/A ?Date Acquired: 07/15/2020 N/A N/A ?Weeks of Treatment: 16 N/A N/A ?Wound Status: Open N/A N/A ?Wound Recurrence: No N/A N/A ?Measurements L x W x D (cm) 0.8x0.9x0.4 N/A N/A ?Area (cm?) :  0.565 N/A N/A ?Volume (cm?) : 0.226 N/A N/A ?% Reduction in Area: 49.70% N/A N/A ?% Reduction in Volume: 66.50% N/A N/A ?Starting Position 1 (o'clock): 7 ?Ending Position 1 (o'clock): 1 ?Maximum Distance 1 (cm): 0.8 ?Undermining: Yes N/A N/A ?Classification: Grade 2 N/A N/A ?Exudate Amount: Medium N/A N/A ?Exudate Type: Serosanguineous N/A N/A ?Exudate Color: red, brown N/A N/A ?Wound Margin: Thickened N/A N/A ?Granulation Amount: Large (67-100%) N/A N/A ?Granulation Quality: Red N/A N/A ?Necrotic Amount: Small (1-33%) N/A N/A ?Exposed Structures: ?Fat Layer (Subcutaneous Tissue): N/A N/A ?Yes ?Fascia: No ?Tendon: No ?Muscle: No ?Joint: No ?Bone: No ?Epithelialization: None N/A  N/A ?Treatment Notes ?Electronic Signature(s) ?Signed: 03/03/2022 5:29:17 PM By: Gretta Cool, BSN, RN, CWS, Kim RN, BSN ?JAMOND, NEELS ([id number]) ?Entered By: Gretta Cool, BSN, RN, CWS, Kim on 03/02/2022 13:04:40 ?Liou, Koven ([id number]) ?-------------------------------------------------------------------------------- ?Multi-Disciplinary Care Plan Details ?Patient Name: [name], [name] ?Date of Service: 03/02/2022 12:30 PM ?Medical Record Number: [id number] ?Patient Account Number: [id number] ?Date of Birth/Sex: [birth year] (66 y.o. M) ?Treating RN: Cornell Barman ?Primary Care Dianne Whelchel: [name] Other Clinician: ?Referring Greta Yung: [name] ?Treating Shakir Petrosino/Extender: Kalman Shan ?Weeks in Treatment: 16 ?Active Inactive ?Wound/Skin Impairment ?Nursing Diagnoses: ?Impaired tissue integrity ?Knowledge deficit related to smoking impact on wound healing ?Knowledge deficit related to ulceration/compromised skin integrity ?Goals: ?Patient/caregiver will verbalize understanding of skin care regimen ?Date Initiated: 11/10/2021 ?Date Inactivated: 12/01/2021 ?Target Resolution Date: 11/17/2021 ?Goal Status: Met ?Ulcer/skin breakdown will have a volume reduction of 30% by week 4 ?Date Initiated: 11/10/2021 ?Date Inactivated: 02/02/2022 ?Target Resolution  Date: 12/08/2021 ?Goal Status: Met ?Ulcer/skin breakdown will have a volume reduction of 50% by week 8 ?Date Initiated: 11/10/2021 ?Target Resolution Date: 01/05/2022 ?Goal Status: Active ?Ulcer/skin breakdown will have a volume reduction of 80% by week 12 ?Date Initiated: 11/10/2021 ?Target Resolution Date: 02/02/2022 ?Goal Status: Active ?Ulcer/skin breakdown will heal within 14 weeks ?Date Initiated: 11/10/2021 ?Target Resolution Date: 02/16/2022 ?Goal Status: Active ?Interventions: ?Assess patient/caregiver ability to obtain necessary supplies ?Assess patient/caregiver ability to perform ulcer/skin care regimen upon admission and as needed ?Assess ulceration(s) every visit ?Notes: ?Electronic Signature(s) ?Signed: 03/03/2022 5:29:17 PM By: Gretta Cool, BSN, RN, CWS, Kim RN, BSN ?Entered By: Gretta Cool, BSN, RN, CWS, Kim on 03/02/2022 13:04:31 ?Gaudin, Donathan ([id number]) ?-------------------------------------------------------------------------------- ?Pain Assessment Details ?Patient Name: [name], [name] ?Date of Service: 03/02/2022 12:30 PM ?Medical Record Number: [id number] ?Patient Account Number: [id number] ?Date of Birth/Sex: [birth year] (66 y.o. M) ?Treating RN: Cornell Barman ?Primary Care Shahd Occhipinti: [name] Other Clinician: ?Referring Mazikeen Hehn: [name] ?Treating Alyscia Carmon/Extender: Kalman Shan ?Weeks in Treatment: 16 ?Active Problems ?Location of Pain Severity and Description of Pain ?Patient Has Paino Yes ?Site Locations ?Pain Location: ?Pain in Ulcers ?Rate the pain. ?Current Pain Level: 5 ?Character of Pain ?Describe the Pain: Tender ?Pain Management and Medication ?Current Pain Management: ?Electronic Signature(s) ?Signed: 03/03/2022 5:29:17 PM By: Gretta Cool, BSN, RN, CWS, Kim RN, BSN ?Entered By: Gretta Cool, BSN, RN, CWS, Kim on 03/02/2022 12:48:23 ?Zollner, Kartel ([id number]) ?-------------------------------------------------------------------------------- ?Patient/Caregiver Education Details ?Patient Name: [name], [name] ?Date of Service: 03/02/2022 12:30 PM ?Medical Record Number: [id number] ?Patient Account Number: [id number] ?Date of Birth/Gender: [birth year] (66 y.o. M) ?Treating RN: Cornell Barman ?Primary Care Physician: Oneida Alar,

## 2022-03-09 ENCOUNTER — Encounter: Payer: Medicare Other | Admitting: Internal Medicine

## 2022-03-16 ENCOUNTER — Encounter (HOSPITAL_BASED_OUTPATIENT_CLINIC_OR_DEPARTMENT_OTHER): Payer: Medicare Other | Admitting: Internal Medicine

## 2022-03-16 DIAGNOSIS — E11621 Type 2 diabetes mellitus with foot ulcer: Secondary | ICD-10-CM | POA: Diagnosis not present

## 2022-03-16 DIAGNOSIS — L97512 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ed: Secondary | ICD-10-CM | POA: Diagnosis not present

## 2022-03-16 NOTE — Progress Notes (Signed)
[name] ([id number]) ?Visit Report for 03/16/2022 ?Chief Complaint Document Details ?Patient Name: [name], [name] ?Date of Service: 03/16/2022 11:30 AM ?Medical Record Number: [id number] ?Patient Account Number: [id number] ?Date of Birth/Sex: 06-09-1956 (66 y.o. M) ?Treating RN: Hansel Feinstein ?Primary Care Provider: Manson Allan Other Clinician: ?Referring Provider: Manson Allan ?Treating Provider/Extender: Geralyn Corwin ?Weeks in Treatment: 18 ?Information Obtained from: Patient ?Chief Complaint ?Right plantar foot wound ?Electronic Signature(s) ?Signed: 03/16/2022 12:38:43 PM By: Geralyn Corwin DO ?Entered By: Geralyn Corwin on 03/16/2022 12:29:29 ?[name], [name] ([id number]) ?-------------------------------------------------------------------------------- ?Debridement Details ?Patient Name: [name], [name] ?Date of Service: 03/16/2022 11:30 AM ?Medical Record Number: [id number] ?Patient Account Number: [id number] ?Date of Birth/Sex: [birth year] (66 y.o. M) ?Treating RN: Hansel Feinstein ?Primary Care Provider: Manson Allan Other Clinician: ?Referring Provider: Manson Allan ?Treating Provider/Extender: Geralyn Corwin ?Weeks in Treatment: 18 ?Debridement Performed for ?Wound #1 Right,Plantar Foot ?Assessment: ?Performed By: Physician Geralyn Corwin, MD ?Debridement Type: Debridement ?Severity of Tissue Pre Debridement: Fat layer exposed ?Level of Consciousness (Pre- ?Awake and Alert ?procedure): ?Pre-procedure Verification/Time Out ?Yes - 11:57 ?Taken: ?Start Time: 11:58 ?Pain Control: Lidocaine ?Total Area Debrided (L x W): 1 (cm) x 0.8 (cm) = 0.8 (cm?) ?Tissue and other material ?Viable, Non-Viable, Callus, Slough, Subcutaneous, Slough ?debrided: ?Level: Skin/Subcutaneous Tissue ?Debridement Description: Excisional ?Instrument: Curette ?Bleeding: Minimum ?Hemostasis Achieved: Pressure ?Response to Treatment: Procedure was tolerated well ?Level of Consciousness (Post- ?Awake and Alert ?procedure): ?Post  Debridement Measurements of Total Wound ?Length: (cm) 0.9 ?Width: (cm) 0.6 ?Depth: (cm) 0.4 ?Volume: (cm?) 0.17 ?Character of Wound/Ulcer Post Debridement: Improved ?Severity of Tissue Post Debridement: Fat layer exposed ?Post Procedure Diagnosis ?Same as Pre-procedure ?Electronic Signature(s) ?Signed: 03/16/2022 12:38:43 PM By: Geralyn Corwin DO ?Signed: 03/16/2022 1:53:21 PM By: Hansel Feinstein ?Entered ByHansel Feinstein on 03/16/2022 12:02:13 ?[name], [name] ([id number]) ?-------------------------------------------------------------------------------- ?HPI Details ?Patient Name: [name], [name] ?Date of Service: 03/16/2022 11:30 AM ?Medical Record Number: [id number] ?Patient Account Number: [id number] ?Date of Birth/Sex: May 24, 1956 (66 y.o. M) ?Treating RN: Hansel Feinstein ?Primary Care Provider: Manson Allan Other Clinician: ?Referring Provider: Manson Allan ?Treating Provider/Extender: Geralyn Corwin ?Weeks in Treatment: 18 ?History of Present Illness ?HPI Description: Admission 11/10/2021 ?[name] is a 66 year old male with a past medical history of uncontrolled type 2 diabetes with last hemoglobin A1c of 8.1 complicated by ?peripheral neuropathy that presents to the clinic for a 2-year history of nonhealing ulcer to the bottom of his right foot. He states this started out ?as a blister caused by a work boot. He reports receiving wound care when he resided in [place]. He is reestablishing his wound care in [place] [facility] today. He is currently keeping the area clean and covered. He has insoles designed to help offload the wound bed. He currently ?denies signs of infection. ?1/18; patient presents for follow-up. He has been using Hydrofera Blue to the wound bed. He has no issues or complaints today. He has not ?received the defender.[name] He denies signs of infection. ?2/1; patient presents for follow-up. He has been using Hydrofera Blue to the wound bed he states he received the defender boot and has  been ?using it however he did not have it on today. He currently denies signs of infection to the right foot. Unfortunately he developed a wound to the ?left great toe over the past week. He states he received new orthotics and these caused a blister to the left great toe which turned into a wound. ?He has not been doing anything to the wound bed. He currently denies systemic signs of infection. ?  2/8; Patient presents for follow-up. Since last seen in the clinic he experienced a CVA and was hospitalized for 2 days. 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 He states he is about 90% recovered. He has some mild weakness to the left side. He reports ?taking the antibiotics prescribed at last clinic visit. He reports using Hydrofera Blue for dressing changes. He only uses the offloading boot when he ?is at home. He uses open toed shoes to the right foot. He currently denies signs of infection. ?2/15; patient presents for follow-up. He states he is still taking the antibiotics prescribed. He reports using the defender boot to the right foot and ?open toed shoe to the left foot however he is not wearing either today. He currently denies systemic signs of infection. He stated he cut down a ?tree last week and was outside doing yard work. ?2/22; patient presents for follow-up. 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 He reports feeling well. He reports ?using the defender boot to the right leg and continues to use Hydrofera Blue with dressing changes. He denies systemic signs of infection. ?3/8; patient presents for follow-up. He sees Dr. Alberteen Spindle tomorrow for follow-up of the left great toe amputation. This was a partial amputation. He ?reports he is taking Augmentin currently. He reports increased erythema to the left great toe amputation site. He also reports a decline in his right ?plantar foot wound. He reports it is draining more. He denies purulent  drainage. ?3/15; patient presents for follow-up. He did not see Dr. Alberteen Spindle last week but states he sees him today. He states he has been taking doxycycline in ?the past week and started gentamicin ointment. He continues to use [facility] with dressing changes. He currently denies systemic signs of ?infection. ?3/22; patient presents for follow-up. Patient had partial amputation of the left great toe on 2/15. 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 Alberteen Spindle. He reports no issues and has no complaints. He is currently taking Keflex and doxycycline prescribed by Dr. Alberteen Spindle. ?He continues to use gentamicin ointment and Hydrofera Blue to the right foot wound. He reports using the defender boot when he is at home. ?3/29; patient presents for follow-up. He continues to be on antibiotics per podiatry. He has been using Hydrofera Blue and gentamicin ointment to ?the right plantar foot wound. He has no issues or complaints today. He denies signs of infection. ?4/5; patient presents for follow-up. He has been using Dakin's wet-to-dry dressings with improvement to wound healing. He denies signs of ?infection. She has no issues or complaints today. 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 Use Hydrofera Blue for a prolonged period of time and has been using Dakin's wet- ?to-dry I think for the last 2 to 3 weeks. He has a Psychologist, forensic at home but he does not wear that coming into the clinic because he drives. He ?tells me he has been compliant with the defender boot short of when he goes out to drive. He lives alone. He also tells me he has had a previous ?left great toe amputation ?We are following him for the right foot wound. ?4/26; patient presents for follow-up. He had worsening of his left foot wound and had surgical debridement on 4/16 by Dr. Fanny Skates. He is being ?followed by podiatry for this issue. He he is on IV Unasyn based on tissue culture by ID.[name] We  are  following him for the right foot wound. He has ?been using Dakin's wet-to-dry dressings without issues. He reports continued usage of the defender boot. ?5/3; patient presents for follow-up. He has been using Dakin's

## 2022-03-16 NOTE — Progress Notes (Signed)
DEMERIUS, [name] ([id number]) ?Visit Report for 03/16/2022 ?Arrival Information Details ?Patient Name: [name], [name] ?Date of Service: 03/16/2022 11:30 AM ?Medical Record Number: [id number] ?Patient Account Number: [id number] ?Date of Birth/Sex: 1956/09/21 (66 y.o. M) ?Treating RN: Donnamarie Poag ?Primary Care Mamta Rimmer: [name] Other Clinician: ?Referring [name] Furtick: [name] ?Treating Nidya Bouyer/Extender: Kalman Shan ?Weeks in Treatment: 18 ?Visit Information History Since Last Visit ?Added or deleted any medications: No ?Patient Arrived: Ambulatory ?Had a fall or experienced change in No ?Arrival Time: 11:36 ?activities of daily living that may affect ?Accompanied By: self ?risk of falls: ?Transfer Assistance: None ?Hospitalized since last visit: No ?Patient Identification Verified: Yes ?Has Dressing in Place as Prescribed: Yes ?Secondary Verification Process Completed: Yes ?Pain Present Now: No ?Patient Requires Transmission-Based No ?Precautions: ?Patient Has Alerts: Yes ?Patient Alerts: Patient on Blood ?Thinner ?DIABETIC ?Plavix ?Electronic Signature(s) ?Signed: 03/16/2022 1:53:21 PM By: Donnamarie Poag ?Entered ByDonnamarie Poag on 03/16/2022 11:36:59 ?Luton, Xander ([id number]) ?-------------------------------------------------------------------------------- ?Encounter Discharge Information Details ?Patient Name: [name], [name] ?Date of Service: 03/16/2022 11:30 AM ?Medical Record Number: [id number] ?Patient Account Number: [id number] ?Date of Birth/Sex: [birth year] (66 y.o. M) ?Treating RN: Donnamarie Poag ?Primary Care Mishawn Hemann: [name] Other Clinician: ?Referring Zylah Elsbernd: [name] ?Treating Daquana Paddock/Extender: Kalman Shan ?Weeks in Treatment: 18 ?Encounter Discharge Information Items Post Procedure Vitals ?Discharge Condition: Stable ?Temperature (?F): 97.8 ?Ambulatory Status: Ambulatory ?Pulse (bpm): 76 ?Discharge Destination: Home ?Respiratory Rate (breaths/min): 16 ?Transportation: Private  Auto ?Blood Pressure (mmHg): 114/78 ?Accompanied By: self ?Schedule Follow-up Appointment: Yes ?Clinical Summary of Care: ?Electronic Signature(s) ?Signed: 03/16/2022 1:53:21 PM By: Donnamarie Poag ?Entered ByDonnamarie Poag on 03/16/2022 12:12:29 ?Ramsay, Vicente ([id number]) ?-------------------------------------------------------------------------------- ?Lower Extremity Assessment Details ?Patient Name: [name], [name] ?Date of Service: 03/16/2022 11:30 AM ?Medical Record Number: [id number] ?Patient Account Number: [id number] ?Date of Birth/Sex: July 17, 1956 (66 y.o. M) ?Treating RN: Donnamarie Poag ?Primary Care Jowana Thumma: [name] Other Clinician: ?Referring Ibraheem Voris: [name] ?Treating Kelce Bouton/Extender: Kalman Shan ?Weeks in Treatment: 18 ?Edema Assessment ?Assessed: [Left: No] [Right: Yes] ?Edema: [Left: N] [Right: o] ?Vascular Assessment ?Pulses: ?Dorsalis Pedis ?Palpable: [Right:Yes] ?Electronic Signature(s) ?Signed: 03/16/2022 1:53:21 PM By: Donnamarie Poag ?Entered ByDonnamarie Poag on 03/16/2022 11:47:08 ?Wurtz, Jessica ([id number]) ?-------------------------------------------------------------------------------- ?Multi Wound Chart Details ?Patient Name: [name], [name] ?Date of Service: 03/16/2022 11:30 AM ?Medical Record Number: [id number] ?Patient Account Number: [id number] ?Date of Birth/Sex: 1956/08/25 (66 y.o. M) ?Treating RN: Donnamarie Poag ?Primary Care Arlen Legendre: [name] Other Clinician: ?Referring Cardell Rachel: [name] ?Treating Tyshon Fanning/Extender: Kalman Shan ?Weeks in Treatment: 18 ?Vital Signs ?Height(in): 74 ?Pulse(bpm): 93 ?Weight(lbs): 226 ?Blood Pressure(mmHg): 104/62 ?Body Mass Index(BMI): 29 ?Temperature(??F): 98.1 ?Respiratory Rate(breaths/min): 16 ?Photos: [N/A:N/A] ?Wound Location: Right, Plantar Foot N/A N/A ?Wounding Event: Blister N/A N/A ?Primary Etiology: Diabetic Wound/Ulcer of the Lower N/A N/A ?Extremity ?Comorbid History: Type II Diabetes, Osteoarthritis N/A N/A ?Date  Acquired: 07/15/2020 N/A N/A ?Weeks of Treatment: 18 N/A N/A ?Wound Status: Open N/A N/A ?Wound Recurrence: No N/A N/A ?Measurements L x W x D (cm) 0.7x0.4x0.4 N/A N/A ?Area (cm?) : 0.22 N/A N/A ?Volume (cm?) : 0.088 N/A N/A ?% Reduction in Area: 80.40% N/A N/A ?% Reduction in Volume: 86.90% N/A N/A ?Starting Position 1 (o'clock): 12 ?Ending Position 1 (o'clock): 6 ?Maximum Distance 1 (cm): 0.4 ?Undermining: Yes N/A N/A ?Classification: Grade 2 N/A N/A ?Exudate Amount: Medium N/A N/A ?Exudate Type: Serosanguineous N/A N/A ?Exudate Color: red, brown N/A N/A ?Wound Margin: Thickened N/A N/A ?Granulation Amount: Large (67-100%) N/A N/A ?Granulation Quality: Red N/A N/A ?Necrotic Amount: Small (1-33%) N/A N/A ?Exposed Structures: ?Fat Layer (Subcutaneous Tissue):  N/A N/A ?Yes ?Fascia: No ?Tendon: No ?Muscle: No ?Joint: No ?Bone: No ?Epithelialization: None N/A N/A ?Treatment Notes ?Electronic Signature(s) ?Signed: 03/16/2022 1:53:21 PM By: Donnamarie Poag ?[name], [name] ([id number]) ?Entered ByDonnamarie Poag on 03/16/2022 11:47:31 ?Brittingham, Trung ([id number]) ?-------------------------------------------------------------------------------- ?Multi-Disciplinary Care Plan Details ?Patient Name: [name], [name] ?Date of Service: 03/16/2022 11:30 AM ?Medical Record Number: [id number] ?Patient Account Number: [id number] ?Date of Birth/Sex: 09/01/1956 (66 y.o. M) ?Treating RN: Donnamarie Poag ?Primary Care Othella Slappey: [name] Other Clinician: ?Referring Ramone Gander: [name] ?Treating Jayion Schneck/Extender: Kalman Shan ?Weeks in Treatment: 18 ?Active Inactive ?Wound/Skin Impairment ?Nursing Diagnoses: ?Impaired tissue integrity ?Knowledge deficit related to smoking impact on wound healing ?Knowledge deficit related to ulceration/compromised skin integrity ?Goals: ?Patient/caregiver will verbalize understanding of skin care regimen ?Date Initiated: 11/10/2021 ?Date Inactivated: 12/01/2021 ?Target Resolution Date: 11/17/2021 ?Goal Status:  Met ?Ulcer/skin breakdown will have a volume reduction of 30% by week 4 ?Date Initiated: 11/10/2021 ?Date Inactivated: 02/02/2022 ?Target Resolution Date: 12/08/2021 ?Goal Status: Met ?Ulcer/skin breakdown will have a volume reduction of 50% by week 8 ?Date Initiated: 11/10/2021 ?Target Resolution Date: 01/05/2022 ?Goal Status: Active ?Ulcer/skin breakdown will have a volume reduction of 80% by week 12 ?Date Initiated: 11/10/2021 ?Target Resolution Date: 02/02/2022 ?Goal Status: Active ?Ulcer/skin breakdown will heal within 14 weeks ?Date Initiated: 11/10/2021 ?Target Resolution Date: 02/16/2022 ?Goal Status: Active ?Interventions: ?Assess patient/caregiver ability to obtain necessary supplies ?Assess patient/caregiver ability to perform ulcer/skin care regimen upon admission and as needed ?Assess ulceration(s) every visit ?Notes: ?Electronic Signature(s) ?Signed: 03/16/2022 1:53:21 PM By: Donnamarie Poag ?Entered ByDonnamarie Poag on 03/16/2022 11:47:23 ?Rogowski, Aubery ([id number]) ?-------------------------------------------------------------------------------- ?Pain Assessment Details ?Patient Name: [name], [name] ?Date of Service: 03/16/2022 11:30 AM ?Medical Record Number: [id number] ?Patient Account Number: [id number] ?Date of Birth/Sex: 04-11-1956 (66 y.o. M) ?Treating RN: Donnamarie Poag ?Primary Care Altamese Deguire: [name] Other Clinician: ?Referring Rishita Petron: [name] ?Treating Melady Chow/Extender: Kalman Shan ?Weeks in Treatment: 18 ?Active Problems ?Location of Pain Severity and Description of Pain ?Patient Has Paino No ?Site Locations ?Rate the pain. ?Current Pain Level: 0 ?Pain Management and Medication ?Current Pain Management: ?Electronic Signature(s) ?Signed: 03/16/2022 1:53:21 PM By: Donnamarie Poag ?Entered ByDonnamarie Poag on 03/16/2022 11:40:26 ?Wrobel, Rudell ([id number]) ?-------------------------------------------------------------------------------- ?Patient/Caregiver Education Details ?Patient Name: [name], [name] ?Date of Service: 03/16/2022 11:30 AM ?Medical Record Number: [id number] ?Patient Account Number: [id number] ?Date of Birth/Gender: 10/25/1956 (66 y.o. M) ?Treating RN: Donnamarie Poag ?Primary Care Physician: Oneida Alar,

## 2022-03-23 ENCOUNTER — Encounter (HOSPITAL_BASED_OUTPATIENT_CLINIC_OR_DEPARTMENT_OTHER): Payer: Medicare Other | Admitting: Internal Medicine

## 2022-03-23 DIAGNOSIS — E11621 Type 2 diabetes mellitus with foot ulcer: Secondary | ICD-10-CM

## 2022-03-23 DIAGNOSIS — L97512 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ed: Secondary | ICD-10-CM | POA: Diagnosis not present

## 2022-03-23 NOTE — Progress Notes (Signed)
[name], [name] ([id number]) Visit Report for 03/23/2022 Arrival Information Details Patient Name: [name], [name] Date of Service: 03/23/2022 8:30 AM Medical Record Number: [id number] Patient Account Number: [id number] Date of Birth/Sex: Apr 13, 1956 (65 y.o. M) Treating RN: Levora Dredge Primary Care Dorisann Schwanke: [name] Other Clinician: Referring Kenton Fortin: [name] Treating Camera Krienke/Extender: Yaakov Guthrie in Treatment: 20 Visit Information History Since Last Visit Added or deleted any medications: No Patient Arrived: Ambulatory Any new allergies or adverse reactions: No Arrival Time: 08:24 Had a fall or experienced change in No Accompanied By: self activities of daily living that may affect Transfer Assistance: None risk of falls: Patient Requires Transmission-Based No Hospitalized since last visit: No Precautions: Has Dressing in Place as Prescribed: Yes Patient Has Alerts: Yes Pain Present Now: Yes Patient Alerts: Patient on Blood Thinner DIABETIC Plavix Electronic Signature(s) Signed: 03/23/2022 4:35:14 PM By: Levora Dredge Entered By: Levora Dredge on 03/23/2022 08:40:14 [name] ([id number]) -------------------------------------------------------------------------------- Clinic Level of Care Assessment Details Patient Name: [name] Date of Service: 03/23/2022 8:30 AM Medical Record Number: [id number] Patient Account Number: [id number] Date of Birth/Sex: 14-Oct-1956 (65 y.o. M) Treating RN: Levora Dredge Primary Care Madysen Faircloth: [name] Other Clinician: Referring Sipriano Fendley: [name] Treating Mililani Murthy/Extender: Yaakov Guthrie in Treatment: 80 Clinic Level of Care Assessment Items TOOL 1 Quantity Score []  - Use when EandM and Procedure is performed on INITIAL visit 0 ASSESSMENTS - Nursing Assessment / Reassessment []  - General Physical Exam (combine w/ comprehensive assessment (listed just below) when performed on  new 0 pt. evals) []  - 0 Comprehensive Assessment (HX, ROS, Risk Assessments, Wounds Hx, etc.) ASSESSMENTS - Wound and Skin Assessment / Reassessment []  - Dermatologic / Skin Assessment (not related to wound area) 0 ASSESSMENTS - Ostomy and/or Continence Assessment and Care []  - Incontinence Assessment and Management 0 []  - 0 Ostomy Care Assessment and Management (repouching, etc.) PROCESS - Coordination of Care []  - Simple Patient / Family Education for ongoing care 0 []  - 0 Complex (extensive) Patient / Family Education for ongoing care []  - 0 Staff obtains Consents, Records, Test Results / Process Orders []  - 0 Staff telephones HHA, Nursing Homes / Clarify orders / etc []  - 0 Routine Transfer to another Facility (non-emergent condition) []  - 0 Routine Hospital Admission (non-emergent condition) []  - 0 New Admissions / Biomedical engineer / Ordering NPWT, Apligraf, etc. []  - 0 Emergency Hospital Admission (emergent condition) PROCESS - Special Needs []  - Pediatric / Minor Patient Management 0 []  - 0 Isolation Patient Management []  - 0 Hearing / Language / Visual special needs []  - 0 Assessment of Community assistance (transportation, D/C planning, etc.) []  - 0 Additional assistance / Altered mentation []  - 0 Support Surface(s) Assessment (bed, cushion, seat, etc.) INTERVENTIONS - Miscellaneous []  - External ear exam 0 []  - 0 Patient Transfer (multiple staff / Civil Service fast streamer / Similar devices) []  - 0 Simple Staple / Suture removal (25 or less) []  - 0 Complex Staple / Suture removal (26 or more) []  - 0 Hypo/Hyperglycemic Management (do not check if billed separately) []  - 0 Ankle / Brachial Index (ABI) - do not check if billed separately Has the patient been seen at the hospital within the last three years: Yes Total Score: 0 Level Of Care: ____ [name] ([id number]) Electronic Signature(s) Signed: 03/23/2022 4:35:14 PM By: Levora Dredge Entered By:  Levora Dredge on 03/23/2022 09:10:56 [name] ([id number]) -------------------------------------------------------------------------------- Encounter Discharge Information Details Patient Name: [name] Date of Service: 03/23/2022 8:30 AM Medical Record  Number: [id number] Patient Account Number: [id number] Date of Birth/Sex: [birth year] (66 y.o. M) Treating RN: Levora Dredge Primary Care Ambreen Tufte: [name] Other Clinician: Referring Deaken Jurgens: [name] Treating Marlicia Sroka/Extender: Yaakov Guthrie in Treatment: 75 Encounter Discharge Information Items Post Procedure Vitals Discharge Condition: Stable Temperature (F): 97.5 Ambulatory Status: Ambulatory Pulse (bpm): 77 Discharge Destination: Home Respiratory Rate (breaths/min): 18 Transportation: Private Auto Blood Pressure (mmHg): 126/69 Accompanied By: self Schedule Follow-up Appointment: Yes Clinical Summary of Care: Electronic Signature(s) Signed: 03/23/2022 4:35:14 PM By: Levora Dredge Entered By: Levora Dredge on 03/23/2022 09:13:06 [name] ([id number]) -------------------------------------------------------------------------------- Lower Extremity Assessment Details Patient Name: [name] Date of Service: 03/23/2022 8:30 AM Medical Record Number: [id number] Patient Account Number: [id number] Date of Birth/Sex: 1955/12/16 (65 y.o. M) Treating RN: Levora Dredge Primary Care Zoi Devine: [name] Other Clinician: Referring Suprina Mandeville: [name] Treating Menucha Dicesare/Extender: Yaakov Guthrie in Treatment: 19 Vascular Assessment Pulses: Dorsalis Pedis Palpable: [Right:Yes] Electronic Signature(s) Signed: 03/23/2022 4:35:14 PM By: Levora Dredge Entered By: Levora Dredge on 03/23/2022 08:48:04 [name] ([id number]) -------------------------------------------------------------------------------- Multi Wound Chart Details Patient Name: [name] Date of  Service: 03/23/2022 8:30 AM Medical Record Number: [id number] Patient Account Number: [id number] Date of Birth/Sex: [birth year] (65 y.o. M) Treating RN: Levora Dredge Primary Care Octivia Canion: [name] Other Clinician: Referring Claudie Brickhouse: [name] Treating Breea Loncar/Extender: Yaakov Guthrie in Treatment: 37 Vital Signs Height(in): 74 Pulse(bpm): 77 Weight(lbs): 226 Blood Pressure(mmHg): 126/69 Body Mass Index(BMI): 29 Temperature(F): 97.5 Respiratory Rate(breaths/min): 18 Photos: [N/A:N/A] Wound Location: Right, Plantar Foot N/A N/A Wounding Event: Blister N/A N/A Primary Etiology: Diabetic Wound/Ulcer of the Lower N/A N/A Extremity Comorbid History: Type II Diabetes, Osteoarthritis N/A N/A Date Acquired: 07/15/2020 N/A N/A Weeks of Treatment: 19 N/A N/A Wound Status: Open N/A N/A Wound Recurrence: No N/A N/A Measurements L x W x D (cm) 1x0.5x0.3 N/A N/A Area (cm) : 0.393 N/A N/A Volume (cm) : 0.118 N/A N/A % Reduction in Area: 65.00% N/A N/A % Reduction in Volume: 82.50% N/A N/A Starting Position 1 (o'clock): 12 Ending Position 1 (o'clock): 6 Maximum Distance 1 (cm): 0.3 Undermining: Yes N/A N/A Classification: Grade 2 N/A N/A Exudate Amount: Medium N/A N/A Exudate Type: Serosanguineous N/A N/A Exudate Color: red, brown N/A N/A Wound Margin: Thickened N/A N/A Granulation Amount: Large (67-100%) N/A N/A Granulation Quality: Red N/A N/A Necrotic Amount: Small (1-33%) N/A N/A Exposed Structures: Fat Layer (Subcutaneous Tissue): N/A N/A Yes Fascia: No Tendon: No Muscle: No Joint: No Bone: No Epithelialization: None N/A N/A Debridement: Debridement - Excisional N/A N/A Pre-procedure Verification/Time 08:58 N/A N/A Out Taken: Tissue Debrided: Callus, Subcutaneous, Slough N/A N/A Level: Skin/Subcutaneous Tissue N/A N/A Debridement Area (sq cm): 0.5 N/A N/A Instrument: Curette N/A N/A Bleeding: Minimum N/A N/A Kuiken, Zaydon  ([id number]) Hemostasis Achieved: Pressure N/A N/A Debridement Treatment Procedure was tolerated well N/A N/A Response: Post Debridement 1x0.5x0.3 N/A N/A Measurements L x W x D (cm) Post Debridement Volume: 0.118 N/A N/A (cm) Procedures Performed: Debridement N/A N/A Treatment Notes Electronic Signature(s) Signed: 03/23/2022 4:35:14 PM By: Levora Dredge Entered By: Levora Dredge on 03/23/2022 09:11:32 [name] ([id number]) -------------------------------------------------------------------------------- Multi-Disciplinary Care Plan Details Patient Name: [name] Date of Service: 03/23/2022 8:30 AM Medical Record Number: [id number] Patient Account Number: [id number] Date of Birth/Sex: 1956/08/11 (65 y.o. M) Treating RN: Levora Dredge Primary Care Ingris Pasquarella: [name] Other Clinician: Referring Syon Tews: [name] Treating Kenita Bines/Extender: Yaakov Guthrie in Treatment: 76 Active Inactive Wound/Skin Impairment Nursing Diagnoses: Impaired tissue integrity Knowledge deficit related to smoking impact on wound healing  Knowledge deficit related to ulceration/compromised skin integrity Goals: Patient/caregiver will verbalize understanding of skin care regimen Date Initiated: 11/10/2021 Date Inactivated: 12/01/2021 Target Resolution Date: 11/17/2021 Goal Status: Met Ulcer/skin breakdown will have a volume reduction of 30% by week 4 Date Initiated: 11/10/2021 Date Inactivated: 02/02/2022 Target Resolution Date: 12/08/2021 Goal Status: Met Ulcer/skin breakdown will have a volume reduction of 50% by week 8 Date Initiated: 11/10/2021 Target Resolution Date: 01/05/2022 Goal Status: Active Ulcer/skin breakdown will have a volume reduction of 80% by week 12 Date Initiated: 11/10/2021 Target Resolution Date: 02/02/2022 Goal Status: Active Ulcer/skin breakdown will heal within 14 weeks Date Initiated: 11/10/2021 Target Resolution Date: 02/16/2022 Goal Status:  Active Interventions: Assess patient/caregiver ability to obtain necessary supplies Assess patient/caregiver ability to perform ulcer/skin care regimen upon admission and as needed Assess ulceration(s) every visit Notes: Electronic Signature(s) Signed: 03/23/2022 4:35:14 PM By: Levora Dredge Entered By: Levora Dredge on 03/23/2022 09:11:25 [name] ([id number]) -------------------------------------------------------------------------------- Pain Assessment Details Patient Name: [name] Date of Service: 03/23/2022 8:30 AM Medical Record Number: [id number] Patient Account Number: [id number] Date of Birth/Sex: 11-05-1955 (65 y.o. M) Treating RN: Levora Dredge Primary Care Geral Tuch: [name] Other Clinician: Referring Kahlil Cowans: [name] Treating Tahir Blank/Extender: Yaakov Guthrie in Treatment: 32 Active Problems Location of Pain Severity and Description of Pain Patient Has Paino Yes Site Locations Rate the pain. Current Pain Level: 5 Pain Management and Medication Current Pain Management: Electronic Signature(s) Signed: 03/23/2022 4:35:14 PM By: Levora Dredge Entered By: Levora Dredge on 03/23/2022 08:40:37 [name] ([id number]) -------------------------------------------------------------------------------- Patient/Caregiver Education Details Patient Name: [name] Date of Service: 03/23/2022 8:30 AM Medical Record Number: [id number] Patient Account Number: [id number] Date of Birth/Gender: [birth year] (65 y.o. M) Treating RN: Levora Dredge Primary Care Physician: [name] Other Clinician: Referring Physician: [name] Treating Physician/Extender: Yaakov Guthrie in Treatment: 69 Education Assessment Education Provided To: Patient Education Topics Provided Wound Debridement: Handouts: Wound Debridement Methods: Explain/Verbal Responses: State content correctly Wound/Skin Impairment: Handouts: Caring for  Your Ulcer Methods: Explain/Verbal Responses: State content correctly Electronic Signature(s) Signed: 03/23/2022 4:35:14 PM By: Levora Dredge Entered By: Levora Dredge on 03/23/2022 09:11:52 [name] ([id number]) -------------------------------------------------------------------------------- Wound Assessment Details Patient Name: [name] Date of Service: 03/23/2022 8:30 AM Medical Record Number: [id number] Patient Account Number: [id number] Date of Birth/Sex: 08-02-1956 (65 y.o. M) Treating RN: Levora Dredge Primary Care Sheketa Ende: [name] Other Clinician: Referring Ayauna Mcnay: [name] Treating Saory Carriero/Extender: Yaakov Guthrie in Treatment: 19 Wound Status Wound Number: 1 Primary Etiology: Diabetic Wound/Ulcer of the Lower Extremity Wound Location: Right, Plantar Foot Wound Status: Open Wounding Event: Blister Comorbid History: Type II Diabetes, Osteoarthritis Date Acquired: 07/15/2020 Weeks Of Treatment: 19 Clustered Wound: No Photos Wound Measurements Length: (cm) 1 % Reduct Width: (cm) 0.5 % Reduct Depth: (cm) 0.3 Epitheli Area: (cm) 0.393 Tunneli Volume: (cm) 0.118 Undermi Start Endin Maxim ion in Area: 65% ion in Volume: 82.5% alization: None ng: No ning: Yes ing Position (o'clock): 12 g Position (o'clock): 6 um Distance: (cm) 0.3 Wound Description Classification: Grade 2 Foul Od Wound Margin: Thickened Slough/ Exudate Amount: Medium Exudate Type: Serosanguineous Exudate Color: red, brown or After Cleansing: No Fibrino Yes Wound Bed Granulation Amount: Large (67-100%) Exposed Structure Granulation Quality: Red Fascia Exposed: No Necrotic Amount: Small (1-33%) Fat Layer (Subcutaneous Tissue) Exposed: Yes Necrotic Quality: Adherent Slough Tendon Exposed: No Muscle Exposed: No Joint Exposed: No Bone Exposed: No Treatment Notes Wound #1 (Foot) Wound Laterality: Plantar, Right Cleanser Speir, Dorion  ([id number]) Dakin 16 (oz) 0.25 Discharge Instruction:  Use as directed. Normal Saline Discharge Instruction: Wash your hands with soap and water. Remove old dressing, discard into plastic bag and place into trash. Cleanse the wound with Normal Saline prior to applying a clean dressing using gauze sponges, not tissues or cotton balls. Do not scrub or use excessive force. Pat dry using gauze sponges, not tissue or cotton balls. Peri-Wound Care adhesive donut foam peri wound Topical Gentamicin Discharge Instruction: Apply as directed by Aboubacar Matsuo. in office only Mupirocin Ointment Discharge Instruction: Apply as directed by Darrel Gloss. in office only Primary Dressing Hydrofera Blue Ready Transfer Foam, 2.5x2.5 (in/in) Discharge Instruction: Apply Hydrofera Blue Ready to wound bed as directed Secondary Dressing Gauze Discharge Instruction: requested at appt due to shoes Secured With [place] Surgical Tape, 2x2 (in/yd) Conform 4'' - Conforming Stretch Gauze Bandage 4x75 (in/in) Discharge Instruction: Apply as directed Compression Wrap Compression Stockings Add-Ons Electronic Signature(s) Signed: 03/23/2022 4:35:14 PM By: Levora Dredge Entered By: Levora Dredge on 03/23/2022 08:47:53 [name] ([id number]) -------------------------------------------------------------------------------- Vitals Details Patient Name: [name] Date of Service: 03/23/2022 8:30 AM Medical Record Number: [id number] Patient Account Number: [id number] Date of Birth/Sex: 1956/03/27 (65 y.o. M) Treating RN: Levora Dredge Primary Care Mekaylah Klich: [name] Other Clinician: Referring Shyah Cadmus: [name] Treating Kimberlyann Hollar/Extender: Yaakov Guthrie in Treatment: 56 Vital Signs Time Taken: 08:40 Temperature (F): 97.5 Height (in): 74 Pulse (bpm): 77 Weight (lbs): 226 Respiratory Rate (breaths/min): 18 Body Mass Index (BMI): 29 Blood Pressure (mmHg):  126/69 Reference Range: 80 - 120 mg / dl Electronic Signature(s) Signed: 03/23/2022 4:35:14 PM By: Levora Dredge Entered By: Levora Dredge on 03/23/2022 08:40:30

## 2022-03-23 NOTE — Progress Notes (Signed)
[name], Kiev ([id number]) Visit Report for 03/23/2022 Chief Complaint Document Details Patient Name: [name], Greene Date of Service: 03/23/2022 8:30 AM Medical Record Number: [id number] Patient Account Number: [id number] Date of Birth/Sex: [birth year] (66 y.o. M) Treating RN: Angelina PihGordon, Caitlin Primary Care Provider: Manson AllanFields, Glenda Other Clinician: Referring Provider: Manson AllanFields, Glenda Treating Provider/Extender: Tilda FrancoHoffman, Kanai Berrios Weeks in Treatment: 6319 Information Obtained from: Patient Chief Complaint Right plantar foot wound Electronic Signature(s) Signed: 03/23/2022 10:19:16 AM By: Geralyn CorwinHoffman, Delores Thelen DO Entered By: Geralyn CorwinHoffman, Chad Tiznado on 03/23/2022 10:10:43 [name], Haidan ([id number]) -------------------------------------------------------------------------------- Debridement Details Patient Name: [name], Sharmarke Date of Service: 03/23/2022 8:30 AM Medical Record Number: [id number] Patient Account Number: [id number] Date of Birth/Sex: [birth year] (66 y.o. M) Treating RN: Angelina PihGordon, Caitlin Primary Care Provider: Manson AllanFields, Glenda Other Clinician: Referring Provider: Manson AllanFields, Glenda Treating Provider/Extender: Tilda FrancoHoffman, Verlie Hellenbrand Weeks in Treatment: 19 Debridement Performed for Wound #1 Right,Plantar Foot Assessment: Performed By: Physician Geralyn CorwinHoffman, Joren Rehm, MD Debridement Type: Debridement Severity of Tissue Pre Debridement: Fat layer exposed Level of Consciousness (Pre- Awake and Alert procedure): Pre-procedure Verification/Time Out Yes - 08:58 Taken: Total Area Debrided (L x W): 1 (cm) x 0.5 (cm) = 0.5 (cm) Tissue and other material Viable, Non-Viable, Callus, Slough, Subcutaneous, Slough debrided: Level: Skin/Subcutaneous Tissue Debridement Description: Excisional Instrument: Curette Bleeding: Minimum Hemostasis Achieved: Pressure Response to Treatment: Procedure was tolerated well Level of Consciousness (Post- Awake and Alert procedure): Post Debridement Measurements of Total  Wound Length: (cm) 1 Width: (cm) 0.5 Depth: (cm) 0.3 Volume: (cm) 0.118 Character of Wound/Ulcer Post Debridement: Stable Severity of Tissue Post Debridement: Fat layer exposed Post Procedure Diagnosis Same as Pre-procedure Electronic Signature(s) Signed: 03/23/2022 10:19:16 AM By: Geralyn CorwinHoffman, Detavious Rinn DO Signed: 03/23/2022 4:35:14 PM By: Angelina PihGordon, Caitlin Entered By: Angelina PihGordon, Caitlin on 03/23/2022 08:59:55 [name], Duel ([id number]) -------------------------------------------------------------------------------- HPI Details Patient Name: [name], Faizaan Date of Service: 03/23/2022 8:30 AM Medical Record Number: [id number] Patient Account Number: [id number] Date of Birth/Sex: [birth year] (66 y.o. M) Treating RN: Angelina PihGordon, Caitlin Primary Care Provider: Manson AllanFields, Glenda Other Clinician: Referring Provider: Manson AllanFields, Glenda Treating Provider/Extender: Tilda FrancoHoffman, Daiel Strohecker Weeks in Treatment: 3419 History of Present Illness HPI Description: Admission 11/10/2021 [name] is a 66 year old male with a past medical history of uncontrolled type 2 diabetes with last hemoglobin A1c of 8.1 complicated by peripheral neuropathy that presents to the clinic for a 2-year history of nonhealing ulcer to the bottom of his right foot. He states this started out as a blister caused by a work boot. He reports receiving wound care [place] [facility]. He is currently keeping the area clean and covered. He has insoles designed to help offload the wound bed. He currently denies signs of infection. 1/18; patient presents for follow-up. He has been using Hydrofera Blue to the wound bed. He has no issues or complaints today. He has not received the defender.[name]. He denies signs of infection. 2/1; patient presents for follow-up. He has been using Hydrofera Blue to the wound bed he states he received the defender boot and has been using it however he  did not have it on today. He currently denies signs of infection to the right foot. Unfortunately he developed a wound to the left great toe over the past week. He states he received new orthotics and these caused a blister to the left great toe which turned into a wound. He has not been doing anything to the wound bed. He currently denies systemic signs of infection. 2/8; Patient presents for follow-up. Since  last seen in the clinic he experienced a CVA and was hospitalized for 2 days. 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 He states he is about 90% recovered. He has some mild weakness to the left side. He reports taking the antibiotics prescribed at last clinic visit. He reports using Hydrofera Blue for dressing changes. He only uses the offloading boot when he is at home. He uses open toed shoes to the right foot. He currently denies signs of infection. 2/15; patient presents for follow-up. He states he is still taking the antibiotics prescribed. He reports using the defender boot to the right foot and open toed shoe to the left foot however he is not wearing either today. He currently denies systemic signs of infection. He stated he cut down a tree last week and was outside doing yard work. 2/22; patient presents for follow-up. 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 He reports feeling well. He reports using the defender boot to the right leg and continues to use Hydrofera Blue with dressing changes. He denies systemic signs of infection. 3/8; patient presents for follow-up. He sees Dr. Alberteen Spindle tomorrow for follow-up of the left great toe amputation. This was a partial amputation. He reports he is taking Augmentin currently. He reports increased erythema to the left great toe amputation site. He also reports a decline in his right plantar foot wound. He reports it is draining more. He denies purulent drainage. 3/15; patient  presents for follow-up. He did not see Dr. Alberteen Spindle last week but states he sees him today. He states he has been taking doxycycline in the past week and started gentamicin ointment. He continues to use [facility] with dressing changes. He currently denies systemic signs of infection. 3/22; patient presents for follow-up. Patient had partial amputation of the left great toe on 2/15. 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 Alberteen Spindle. He reports no issues and has no complaints. He is currently taking Keflex and doxycycline prescribed by Dr. Alberteen Spindle. He continues to use gentamicin ointment and Hydrofera Blue to the right foot wound. He reports using the defender boot when he is at home. 3/29; patient presents for follow-up. He continues to be on antibiotics per podiatry. He has been using Hydrofera Blue and gentamicin ointment to the right plantar foot wound. He has no issues or complaints today. He denies signs of infection. 4/5; patient presents for follow-up. He has been using Dakin's wet-to-dry dressings with improvement to wound healing. He denies signs of infection. She has no issues or complaints today. 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 Use Hydrofera Blue for a prolonged period of time and has been using Dakin's wet- to-dry I think for the last 2 to 3 weeks. He has a Psychologist, forensic at home but he does not wear that coming into the clinic because he drives. He tells me he has been compliant with the defender boot short of when he goes out to drive. He lives alone. He also tells me he has had a previous left great toe amputation We are following him for the right foot wound. 4/26; patient presents for follow-up. He had worsening of his left foot wound and had surgical debridement on 4/16 by Dr. Fanny Skates. He is being followed by podiatry for this issue. He he is on IV Unasyn based on tissue culture by ID.[name] We are following him for the right  foot wound. He has been using Dakin's wet-to-dry dressings without issues. He reports continued usage of the defender boot. 5/3; patient presents for follow-up. He has been using Dakin's wet-to-dry dressings to the right foot wound. He has no issues or complaints today. He reports using the Psychologist, forensic. He reports offloading the foot wound when he is at home by sitting in a recliner. He denies signs of infection. He is still getting IV antibiotics For the left foot wound that is being followed by podiatry. He states the end date is later this month on 5/28. 5/17; patient presents for follow-up. He missed his last clinic appointment. He has been using Dakin's wet-to-dry dressings. He states he is offloading the right foot wound with the defender boot although he does not have this today. He is still on IV antibiotics. He has no issues or complaints today. 5/24; patient presents for follow-up. He has been using Hydrofera Blue to the wound bed without issues. He denies signs of infection. At last clinic [facility] ([id number]) visit he had a wound culture done that detected high levels of Enterococcus faecalis and low levels of Corynebacterium stratum and Staphylococcus epidermidis. [facility] antibiotics were ordered. He has not heard from the company yet. Electronic Signature(s) Signed: 03/23/2022 10:19:16 AM By: Geralyn Corwin DO Entered By: Geralyn Corwin on 03/23/2022 10:13:54 [name] ([id number]) -------------------------------------------------------------------------------- Physical Exam Details Patient Name: [name] Date of Service: 03/23/2022 8:30 AM Medical Record Number: [id number] Patient Account Number: [id number] Date of Birth/Sex: [birth year] (65 y.o. M) Treating RN: Angelina Pih Primary Care Provider: Manson Allan Other Clinician: Referring Provider: Manson Allan Treating Provider/Extender: Tilda Franco in Treatment:  19 Constitutional . Cardiovascular . Psychiatric . Notes Right foot: To the plantar aspect of the first metatarsal head there is an open wound with granulation tissue, Nonviable tissue and callus. No surrounding signs of infection. Electronic Signature(s) Signed: 03/23/2022 10:19:16 AM By: Geralyn Corwin DO Entered By: Geralyn Corwin on 03/23/2022 10:14:43 [name] ([id number]) -------------------------------------------------------------------------------- Physician Orders Details Patient Name: [name] Date of Service: 03/23/2022 8:30 AM Medical Record Number: [id number] Patient Account Number: [id number] Date of Birth/Sex: [birth year] (65 y.o. M) Treating RN: Angelina Pih Primary Care Provider: Manson Allan Other Clinician: Referring Provider: Manson Allan Treating Provider/Extender: Tilda Franco in Treatment: 85 Verbal / Phone Orders: No Diagnosis Coding Follow-up Appointments o Return Appointment in 1 week. o Nurse Visit as needed Bathing/ Shower/ Hygiene o May shower; gently cleanse wound with antibacterial soap, rinse and pat dry prior to dressing wounds o No tub bath. Anesthetic (Use 'Patient Medications' Section for Anesthetic Order Entry) o Lidocaine applied to wound bed Edema Control - Lymphedema / Segmental Compressive Device / Other o Elevate, Exercise Daily and Avoid Standing for Long Periods of Time. o Elevate leg(s) parallel to the floor when sitting. o DO YOUR BEST to sleep in the bed at night. DO NOT sleep in your recliner. Long hours of sitting in a recliner leads to swelling of the legs and/or potential wounds on your backside. Off-Loading o Foot Defender o - right foot-keep shin covered-keep pressure off of the wound (Patient did not wear boot to clinic each week. Wearing tennis shoe) o Turn and reposition every 2 hours Additional Orders / Instructions o Follow Nutritious Diet and Increase Protein  Intake - monitor blood sugar to maintain normal limits o Other: - Follow up post surgery for left foot-[facility] handling Medications-Please add to medication list. o P.O. Antibiotics - Continue as ordered and followed  by ID and [facility] with PICC line in left arm [facility] initiated Wound Treatment Wound #1 - Foot Wound Laterality: Plantar, Right Cleanser: Dakin 16 (oz) 0.25 Every Other Day/30 Days Discharge Instructions: Use as directed. Cleanser: Normal Saline Every Other Day/30 Days Discharge Instructions: Wash your hands with soap and water. Remove old dressing, discard into plastic bag and place into trash. Cleanse the wound with Normal Saline prior to applying a clean dressing using gauze sponges, not tissues or cotton balls. Do not scrub or use excessive force. Pat dry using gauze sponges, not tissue or cotton balls. Peri-Wound Care: adhesive donut foam peri wound Every Other Day/30 Days Topical: Gentamicin Every Other Day/30 Days Discharge Instructions: Apply as directed by provider. in office only Topical: Mupirocin Ointment Every Other Day/30 Days Discharge Instructions: Apply as directed by provider. in office only Primary Dressing: Hydrofera Blue Ready Transfer Foam, 2.5x2.5 (in/in) Every Other Day/30 Days Discharge Instructions: Apply Hydrofera Blue Ready to wound bed as directed Secondary Dressing: Gauze Every Other Day/30 Days Discharge Instructions: requested at appt due to shoes Secured With: Medipore Tape - 46M Medipore H Soft Cloth Surgical Tape, 2x2 (in/yd) Every Other Day/30 Days Landen, Ryan ([id number]) Secured With: Conform 4'' - Conforming Stretch Gauze Bandage 4x75 (in/in) Every Other Day/30 Days Discharge Instructions: Apply as directed Electronic Signature(s) Signed: 03/23/2022 10:19:16 AM By: Geralyn Corwin DO Entered By: Geralyn Corwin on 03/23/2022 10:18:29 [name]  ([id number]) -------------------------------------------------------------------------------- Problem List Details Patient Name: [name] Date of Service: 03/23/2022 8:30 AM Medical Record Number: [id number] Patient Account Number: [id number] Date of Birth/Sex: [birth year] (65 y.o. M) Treating RN: Angelina Pih Primary Care Provider: Manson Allan Other Clinician: Referring Provider: Manson Allan Treating Provider/Extender: Tilda Franco in Treatment: 28 Active Problems ICD-10 Encounter Code Description Active Date MDM Diagnosis L97.512 Non-pressure chronic ulcer of other part of right foot with fat layer 11/10/2021 No Yes exposed E11.621 Type 2 diabetes mellitus with foot ulcer 11/10/2021 No Yes E11.40 Type 2 diabetes mellitus with diabetic neuropathy, unspecified 11/10/2021 No Yes R26.89 Other abnormalities of gait and mobility 11/10/2021 No Yes Inactive Problems ICD-10 Code Description Active Date Inactive Date L97.528 Non-pressure chronic ulcer of other part of left foot with other specified 12/01/2021 12/01/2021 severity Resolved Problems Electronic Signature(s) Signed: 03/23/2022 10:19:16 AM By: Geralyn Corwin DO Entered By: Geralyn Corwin on 03/23/2022 10:09:58 [name] ([id number]) -------------------------------------------------------------------------------- Progress Note Details Patient Name: [name] Date of Service: 03/23/2022 8:30 AM Medical Record Number: [id number] Patient Account Number: [id number] Date of Birth/Sex: July 17, 1956 (65 y.o. M) Treating RN: Angelina Pih Primary Care Provider: Manson Allan Other Clinician: Referring Provider: Manson Allan Treating Provider/Extender: Tilda Franco in Treatment: 43 Subjective Chief Complaint Information obtained from Patient Right plantar foot wound History of Present Illness (HPI) Admission 11/10/2021 Mr. Alwin Lanigan is a 66 year old male with a past medical history of  uncontrolled type 2 diabetes with last hemoglobin A1c of 8.1 complicated by peripheral neuropathy that presents to the clinic for a 2-year history of nonhealing ulcer to the bottom of his right foot. He states this started out as a blister caused by a work boot. He reports receiving wound care when he resided in [place]. He is reestablishing his wound care in [facility] today. He is currently keeping the area clean and covered. He has insoles designed to help offload the wound bed. He currently denies signs of infection. 1/18; patient presents for follow-up. He has been using Hydrofera Blue to the wound bed. He has no issues  or complaints today. He has not received the defender.[name] He denies signs of infection. 2/1; patient presents for follow-up. He has been using Hydrofera Blue to the wound bed he states he received the defender boot and has been using it however he did not have it on today. He currently denies signs of infection to the right foot. Unfortunately he developed a wound to the left great toe over the past week. He states he received new orthotics and these caused a blister to the left great toe which turned into a wound. He has not been doing anything to the wound bed. He currently denies systemic signs of infection. 2/8; Patient presents for follow-up. 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 He states he is about 90% recovered. He has some mild weakness to the left side. He reports taking the antibiotics prescribed at last clinic visit. He reports using Hydrofera Blue for dressing changes. He only uses the offloading boot when he is at home. He uses open toed shoes to the right foot. He currently denies signs of infection. 2/15; patient presents for follow-up. He states he is still taking the antibiotics prescribed. He reports using the defender boot to  the right foot and open toed shoe to the left foot however he is not wearing either today. He currently denies systemic signs of infection. He stated he cut down a tree last week and was outside doing yard work. 2/22; patient presents for follow-up. 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 He reports feeling well. He reports using the defender boot to the right leg and continues to use Hydrofera Blue with dressing changes. He denies systemic signs of infection. 3/8; patient presents for follow-up. He sees Dr. Alberteen Spindle tomorrow for follow-up of the left great toe amputation. This was a partial amputation. He reports he is taking Augmentin currently. He reports increased erythema to the left great toe amputation site. He also reports a decline in his right plantar foot wound. He reports it is draining more. He denies purulent drainage. 3/15; patient presents for follow-up. He did not see Dr. Alberteen Spindle last week but states he sees him today. He states he has been taking doxycycline in the past week and started gentamicin ointment. He continues to use [facility] with dressing changes. He currently denies systemic signs of infection. 3/22; patient presents for follow-up. Patient had partial amputation of the left great toe on 2/15. 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 Alberteen Spindle. He reports no issues and has no complaints. He is currently taking Keflex and doxycycline prescribed by Dr. Alberteen Spindle. He continues to use gentamicin ointment and Hydrofera Blue to the right foot wound. He reports using the defender boot when he is at home. 3/29; patient presents for follow-up. He continues to be on antibiotics per podiatry. He has been using Hydrofera Blue and gentamicin ointment to the right plantar foot wound. He has no issues or complaints today. He denies signs of infection. 4/5; patient presents for follow-up. He has been using Dakin's wet-to-dry dressings  with improvement to wound healing. He denies signs of infection. She has no issues or complaints today. 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 Use Hydrofera Blue for a prolonged period of time and has been using Dakin's wet- to-dry I think for the last  2 to 3 weeks. He has a Psychologist, forensic at home but he does not wear that coming into the clinic because he drives. He tells me he has been compliant with the defender boot short of when he goes out to drive. He lives alone. He also tells me he has had a previous left great toe amputation We are following him for the right foot wound. 4/26; patient presents for follow-up. He had worsening of his left foot wound and had surgical debridement on 4/16 by Dr. Fanny Skates. He is being followed by podiatry for this issue. He he is on IV Unasyn based on tissue culture by ID.[name] We are following him for the right foot wound. He has been using Dakin's wet-to-dry dressings without issues. He reports continued usage of the defender boot. 5/3; patient presents for follow-up. He has been using Dakin's wet-to-dry dressings to the right foot wound. He has no issues or complaints today. He reports using the Psychologist, forensic. He reports offloading the foot wound when he is at home by sitting in a recliner. He denies signs of infection. He is still getting IV antibiotics For the left foot wound that is being followed by podiatry. He states the end date is later this month on 5/28. ALECXANDER, MAINWARING ([id number]) 5/17; patient presents for follow-up. He missed his last clinic appointment. He has been using Dakin's wet-to-dry dressings. He states he is offloading the right foot wound with the defender boot although he does not have this today. He is still on IV antibiotics. He has no issues or complaints today. 5/24; patient presents for follow-up. He has been using Hydrofera Blue to the wound bed without issues. He denies signs of infection. At last  clinic visit he had a wound culture done that detected high levels of Enterococcus faecalis and low levels of Corynebacterium stratum and Staphylococcus epidermidis. [facility] antibiotics were ordered. He has not heard from the company yet. Objective Constitutional Vitals Time Taken: 8:40 AM, Height: 74 in, Weight: 226 lbs, BMI: 29, Temperature: 97.5 F, Pulse: 77 bpm, Respiratory Rate: 18 breaths/min, Blood Pressure: 126/69 mmHg. General Notes: Right foot: To the plantar aspect of the first metatarsal head there is an open wound with granulation tissue, Nonviable tissue and callus. No surrounding signs of infection. Integumentary (Hair, Skin) Wound #1 status is Open. Original cause of wound was Blister. The date acquired was: 07/15/2020. The wound has been in treatment 19 weeks. The wound is located on the Right,Plantar Foot. The wound measures 1cm length x 0.5cm width x 0.3cm depth; 0.393cm^2 area and 0.118cm^3 volume. There is Fat Layer (Subcutaneous Tissue) exposed. There is no tunneling noted, however, there is undermining starting at 12:00 and ending at 6:00 with a maximum distance of 0.3cm. There is a medium amount of serosanguineous drainage noted. The wound margin is thickened. There is large (67-100%) red granulation within the wound bed. There is a small (1-33%) amount of necrotic tissue within the wound bed including Adherent Slough. Assessment Active Problems ICD-10 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ed Type 2 diabetes mellitus with foot ulcer Type 2 diabetes mellitus with diabetic neuropathy, unspecified Other abnormalities of gait and mobility Patient's wound is stable. I debrided nonviable tissue. We ordered [facility] antibiotic ointment based on culture results done at last clinic visit. We gave him the number to call the company to obtain the medication. I asked him to bring this to next clinic visit. He can continue Hydrofera Blue in  the meantime. Continue aggressive offloading. Follow-up in 1 week. Procedures Wound #1 Pre-procedure diagnosis of Wound #1 is a Diabetic Wound/Ulcer of the Lower Extremity located on the Right,Plantar Foot .Severity of Tissue Pre Debridement is: Fat layer exposed. There was a Excisional Skin/Subcutaneous Tissue Debridement with a total area of 0.5 sq cm performed by Geralyn Corwin, MD. With the following instrument(s): Curette to remove Viable and Non-Viable tissue/material. Material removed includes Callus, Subcutaneous Tissue, and Slough. No specimens were taken. A time out was conducted at 08:58, prior to the start of the procedure. A Minimum amount of bleeding was controlled with Pressure. The procedure was tolerated well. Post Debridement Measurements: 1cm length x 0.5cm width x 0.3cm depth; 0.118cm^3 volume. Character of Wound/Ulcer Post Debridement is stable. Severity of Tissue Post Debridement is: Fat layer exposed. Post procedure Diagnosis Wound #1: Same as Pre-Procedure [place], Erron ([id number]) Plan Follow-up Appointments: Return Appointment in 1 week. Nurse Visit as needed Bathing/ Shower/ Hygiene: May shower; gently cleanse wound with antibacterial soap, rinse and pat dry prior to dressing wounds No tub bath. Anesthetic (Use 'Patient Medications' Section for Anesthetic Order Entry): Lidocaine applied to wound bed Edema Control - Lymphedema / Segmental Compressive Device / Other: Elevate, Exercise Daily and Avoid Standing for Long Periods of Time. Elevate leg(s) parallel to the floor when sitting. DO YOUR BEST to sleep in the bed at night. DO NOT sleep in your recliner. Long hours of sitting in a recliner leads to swelling of the legs and/or potential wounds on your backside. Off-Loading: Foot Defender  - right foot-keep shin covered-keep pressure off of the wound (Patient did not wear boot to clinic each week. Wearing tennis shoe) Turn and reposition every 2  hours Additional Orders / Instructions: Follow Nutritious Diet and Increase Protein Intake - monitor blood sugar to maintain normal limits Other: - Follow up post surgery for left foot-[facility] handling Medications-Please add to medication list.: P.O. Antibiotics - Continue as ordered and followed by ID and [facility] with PICC line in left arm [facility] initiated WOUND #1: - Foot Wound Laterality: Plantar, Right Cleanser: Dakin 16 (oz) 0.25 Every Other Day/30 Days Discharge Instructions: Use as directed. Cleanser: Normal Saline Every Other Day/30 Days Discharge Instructions: Wash your hands with soap and water. Remove old dressing, discard into plastic bag and place into trash. Cleanse the wound with Normal Saline prior to applying a clean dressing using gauze sponges, not tissues or cotton balls. Do not scrub or use excessive force. Pat dry using gauze sponges, not tissue or cotton balls. Peri-Wound Care: adhesive donut foam peri wound Every Other Day/30 Days Topical: Gentamicin Every Other Day/30 Days Discharge Instructions: Apply as directed by provider. in office only Topical: Mupirocin Ointment Every Other Day/30 Days Discharge Instructions: Apply as directed by provider. in office only Primary Dressing: Hydrofera Blue Ready Transfer Foam, 2.5x2.5 (in/in) Every Other Day/30 Days Discharge Instructions: Apply Hydrofera Blue Ready to wound bed as directed Secondary Dressing: Gauze Every Other Day/30 Days Discharge Instructions: requested at appt due to shoes Secured With: Medipore Tape - 72M Medipore H Soft Cloth Surgical Tape, 2x2 (in/yd) Every Other Day/30 Days Secured With: Conform 4'' - Conforming Stretch Gauze Bandage 4x75 (in/in) Every Other Day/30 Days Discharge Instructions: Apply as directed 1. In office sharp debridement 2. Hydrofera Blue 3. Mupirocin and gentamicin in office only 4. Follow-up in 1 week Electronic Signature(s) Signed: 03/23/2022 10:19:16 AM By:  Geralyn Corwin DO Entered By: Geralyn Corwin on 03/23/2022 10:17:23 [name] ([id number]) --------------------------------------------------------------------------------  SuperBill Details Patient Name: ROAN, MIKLOS Date of Service: 03/23/2022 Medical Record Number: [id number] Patient Account Number: [id number] Date of Birth/Sex: [birth year] (66 y.o. 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 E11.40 Type 2 diabetes mellitus with diabetic neuropathy, unspecified R26.89 Other abnormalities of gait and mobility Facility Procedures CPT4 Code: [id number] Description: 11042 - DEB SUBQ TISSUE 20 SQ CM/< Modifier: Quantity: 1 CPT4 Code: Description: ICD-10 Diagnosis Description L97.512 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

## 2022-03-30 ENCOUNTER — Encounter (HOSPITAL_BASED_OUTPATIENT_CLINIC_OR_DEPARTMENT_OTHER): Payer: Medicare Other | Admitting: Internal Medicine

## 2022-03-30 DIAGNOSIS — L97512 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ed: Secondary | ICD-10-CM | POA: Diagnosis not present

## 2022-03-30 DIAGNOSIS — E11621 Type 2 diabetes mellitus with foot ulcer: Secondary | ICD-10-CM

## 2022-03-31 NOTE — Progress Notes (Signed)
BERNABE, DORCE ([id number]) Visit Report for 03/30/2022 Chief Complaint Document Details Patient Name: [name] Date of Service: 03/30/2022 8:30 AM Medical Record Number: [id number] Patient Account Number: [id number] Date of Birth/Sex: [birth year] (65 y.o. M) Treating RN: Huel Coventry Primary Care Provider: Manson Allan Other Clinician: Referring Provider: Manson Allan Treating Provider/Extender: Tilda Franco in Treatment: 20 Information Obtained from: Patient Chief Complaint Right plantar foot wound Electronic Signature(s) Signed: 03/30/2022 11:38:35 AM By: Geralyn Corwin DO Entered By: Geralyn Corwin on 03/30/2022 10:03:06 [name] ([id number]) -------------------------------------------------------------------------------- Debridement Details Patient Name: [name] Date of Service: 03/30/2022 8:30 AM Medical Record Number: [id number] Patient Account Number: [id number] Date of Birth/Sex: Oct 26, 1956 (65 y.o. M) Treating RN: Huel Coventry Primary Care Provider: Manson Allan Other Clinician: Referring Provider: Manson Allan Treating Provider/Extender: Tilda Franco in Treatment: 20 Debridement Performed for Wound #1 Right,Plantar Foot Assessment: Performed By: Physician Geralyn Corwin, MD Debridement Type: Debridement Severity of Tissue Pre Debridement: Limited to breakdown of skin Level of Consciousness (Pre- Awake and Alert procedure): Pre-procedure Verification/Time Out Yes - 09:10 Taken: Total Area Debrided (L x W): 0.9 (cm) x 0.5 (cm) = 0.45 (cm) Tissue and other material Viable, Non-Viable, Callus, Subcutaneous, Biofilm debrided: Level: Skin/Subcutaneous Tissue Debridement Description: Excisional Instrument: Curette Bleeding: Minimum Hemostasis Achieved: Pressure Response to Treatment: Procedure was tolerated well Level of Consciousness (Post- Awake and Alert procedure): Post Debridement Measurements of Total  Wound Length: (cm) 0.9 Width: (cm) 0.8 Depth: (cm) 0.5 Volume: (cm) 0.283 Character of Wound/Ulcer Post Debridement: Stable Severity of Tissue Post Debridement: Fat layer exposed Post Procedure Diagnosis Same as Pre-procedure Electronic Signature(s) Signed: 03/30/2022 11:38:35 AM By: Geralyn Corwin DO Signed: 03/31/2022 9:49:48 AM By: Elliot Gurney, BSN, RN, CWS, Kim RN, BSN Entered By: Elliot Gurney, BSN, RN, CWS, Kim on 03/30/2022 09:16:50 [name] ([id number]) -------------------------------------------------------------------------------- HPI Details Patient Name: [name] Date of Service: 03/30/2022 8:30 AM Medical Record Number: [id number] Patient Account Number: [id number] Date of Birth/Sex: 1956/07/02 (65 y.o. M) Treating RN: Huel Coventry Primary Care Provider: Manson Allan Other Clinician: Referring Provider: Manson Allan Treating Provider/Extender: Tilda Franco in Treatment: 20 History of Present Illness HPI Description: Admission 11/10/2021 [name] is a 66 year old male with a past medical history of uncontrolled type 2 diabetes with last hemoglobin A1c of 8.1 complicated by peripheral neuropathy that presents to the clinic for a 2-year history of nonhealing ulcer to the bottom of his right foot. He states this started out as a blister caused by a work boot. He reports receiving wound care when he resided in [place]. He is reestablishing his wound care in [facility] today. He is currently keeping the area clean and covered. He has insoles designed to help offload the wound bed. He currently denies signs of infection. 1/18; patient presents for follow-up. He has been using Hydrofera Blue to the wound bed. He has no issues or complaints today. He has not received the defender.[name] He denies signs of infection. 2/1; patient presents for follow-up. He has been using Hydrofera Blue to the wound bed he states he received the defender boot and has  been using it however he did not have it on today. He currently denies signs of infection to the right foot. Unfortunately he developed a wound to the left great toe over the past week. He states he received new orthotics and these caused a blister to the left great toe which turned into a wound. He has not been doing anything to the wound bed. He currently denies systemic  signs of infection. 2/8; Patient presents for follow-up. 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 He states he is about 90% recovered. He has some mild weakness to the left side. He reports taking the antibiotics prescribed at last clinic visit. He reports using Hydrofera Blue for dressing changes. He only uses the offloading boot when he is at home. He uses open toed shoes to the right foot. He currently denies signs of infection. 2/15; patient presents for follow-up. He states he is still taking the antibiotics prescribed. He reports using the defender boot to the right foot and open toed shoe to the left foot however he is not wearing either today. He currently denies systemic signs of infection. He stated he cut down a tree last week and was outside doing yard work. 2/22; patient presents for follow-up. 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 He reports feeling well. He reports using the defender boot to the right leg and continues to use Hydrofera Blue with dressing changes. He denies systemic signs of infection. 3/8; patient presents for follow-up. He sees Dr. Alberteen Spindle tomorrow for follow-up of the left great toe amputation. This was a partial amputation. He reports he is taking Augmentin currently. He reports increased erythema to the left great toe amputation site. He also reports a decline in his right plantar foot wound. He reports it is draining more. He denies purulent  drainage. 3/15; patient presents for follow-up. He did not see Dr. Alberteen Spindle last week but states he sees him today. He states he has been taking doxycycline in the past week and started gentamicin ointment. He continues to use [facility] with dressing changes. He currently denies systemic signs of infection. 3/22; patient presents for follow-up. Patient had partial amputation of the left great toe on 2/15. 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 Alberteen Spindle. He reports no issues and has no complaints. He is currently taking Keflex and doxycycline prescribed by Dr. Alberteen Spindle. He continues to use gentamicin ointment and Hydrofera Blue to the right foot wound. He reports using the defender boot when he is at home. 3/29; patient presents for follow-up. He continues to be on antibiotics per podiatry. He has been using Hydrofera Blue and gentamicin ointment to the right plantar foot wound. He has no issues or complaints today. He denies signs of infection. 4/5; patient presents for follow-up. He has been using Dakin's wet-to-dry dressings with improvement to wound healing. He denies signs of infection. She has no issues or complaints today. 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 Use Hydrofera Blue for a prolonged period of time and has been using Dakin's wet- to-dry I think for the last 2 to 3 weeks. He has a Psychologist, forensic at home but he does not wear that coming into the clinic because he drives. He tells me he has been compliant with the defender boot short of when he goes out to drive. He lives alone. He also tells me he has had a previous left great toe amputation We are following him for the right foot wound. 4/26; patient presents for follow-up. He had worsening of his left foot wound and had surgical debridement on 4/16 by Dr. Fanny Skates. He is being followed by podiatry for this issue. He he is on IV Unasyn based on tissue culture  by ID.. We are  following him for the right foot wound. He has been using Dakin's wet-to-dry dressings without issues. He reports continued usage of the defender boot. 5/3; patient presents for follow-up. He has been using Dakin's wet-to-dry dressings to the right foot wound. He has no issues or complaints today. He reports using the Psychologist, forensic. He reports offloading the foot wound when he is at home by sitting in a recliner. He denies signs of infection. He is still getting IV antibiotics For the left foot wound that is being followed by podiatry. He states the end date is later this month on 5/28. 5/17; patient presents for follow-up. He missed his last clinic appointment. He has been using Dakin's wet-to-dry dressings. He states he is offloading the right foot wound with the defender boot although he does not have this today. He is still on IV antibiotics. He has no issues or complaints today. 5/24; patient presents for follow-up. He has been using Hydrofera Blue to the wound bed without issues. He denies signs of infection. At last clinic [facility] ([id number]) visit he had a wound culture done that detected high levels of Enterococcus faecalis and low levels of Corynebacterium stratum and Staphylococcus epidermidis. [facility] antibiotics were ordered. He has not heard from the company yet. 5/31; patient presents for follow-up. He continues to use Hydrofera Blue to the wound bed. He denies signs of infection. [facility] antibiotics were ordered at last clinic visit and he states he called the company to pay for it. He states that it is being shipped. We rediscussed the total contact cast and he declines having this placed today. He states he is wearing the defender boot however he never wears it into the office. Electronic Signature(s) Signed: 03/30/2022 11:38:35 AM By: Geralyn Corwin DO Entered By: Geralyn Corwin on 03/30/2022 10:04:51 [name]  ([id number]) -------------------------------------------------------------------------------- Physical Exam Details Patient Name: [name] Date of Service: 03/30/2022 8:30 AM Medical Record Number: [id number] Patient Account Number: [id number] Date of Birth/Sex: Jun 28, 1956 (65 y.o. M) Treating RN: Huel Coventry Primary Care Provider: Manson Allan Other Clinician: Referring Provider: Manson Allan Treating Provider/Extender: Tilda Franco in Treatment: 20 Constitutional . Cardiovascular . Psychiatric . Notes Right foot: To the plantar aspect of the first metatarsal head there is an open wound with granulation tissue, biofilm and callus. No surrounding signs of infection. Electronic Signature(s) Signed: 03/30/2022 11:38:35 AM By: Geralyn Corwin DO Entered By: Geralyn Corwin on 03/30/2022 10:05:30 [name] ([id number]) -------------------------------------------------------------------------------- Physician Orders Details Patient Name: [name] Date of Service: 03/30/2022 8:30 AM Medical Record Number: [id number] Patient Account Number: [id number] Date of Birth/Sex: [birth year] (65 y.o. M) Treating RN: Huel Coventry Primary Care Provider: Manson Allan Other Clinician: Referring Provider: Manson Allan Treating Provider/Extender: Tilda Franco in Treatment: 20 Verbal / Phone Orders: No Diagnosis Coding Follow-up Appointments o Return Appointment in 1 week. o Nurse Visit as needed Bathing/ Shower/ Hygiene o May shower; gently cleanse wound with antibacterial soap, rinse and pat dry prior to dressing wounds o No tub bath. Anesthetic (Use 'Patient Medications' Section for Anesthetic Order Entry) o Lidocaine applied to wound bed Edema Control - Lymphedema / Segmental Compressive Device / Other o Elevate, Exercise Daily and Avoid Standing for Long Periods of Time. o Elevate leg(s) parallel to the floor when sitting. o DO YOUR  BEST to sleep in the bed at night. DO NOT sleep in your recliner. Long hours of sitting in a recliner leads to swelling of the legs and/or potential wounds  on your backside. Off-Loading o Foot Defender o - right foot-keep shin covered-keep pressure off of the wound (Patient is not wear boot to clinic each week. Wearing tennis shoe) Additional Orders / Instructions o Follow Nutritious Diet and Increase Protein Intake - monitor blood sugar to maintain normal limits o Other: - Follow up post surgery for left foot-[facility] handling Medications-Please add to medication list. o P.O. Antibiotics - [facility] initiated Wound Treatment Wound #1 - Foot Wound Laterality: Plantar, Right Peri-Wound Care: adhesive donut foam peri wound Every Other Day/30 Days Topical: Mupirocin Ointment Every Other Day/30 Days Discharge Instructions: Apply as directed by provider. in office only Primary Dressing: Hydrofera Blue Ready Transfer Foam, 2.5x2.5 (in/in) Every Other Day/30 Days Discharge Instructions: Apply Hydrofera Blue Ready to wound bed as directed Secondary Dressing: Gauze Every Other Day/30 Days Discharge Instructions: requested at appt due to shoes Secured With: Medipore Tape - 40M Medipore H Soft Cloth Surgical Tape, 2x2 (in/yd) Every Other Day/30 Days Secured With: Conform 4'' - Conforming Stretch Gauze Bandage 4x75 (in/in) Every Other Day/30 Days Discharge Instructions: Apply as directed Electronic Signature(s) Signed: 03/30/2022 11:38:35 AM By: Geralyn Corwin DO Entered By: Geralyn Corwin on 03/30/2022 10:08:15 [name] ([id number]) -------------------------------------------------------------------------------- Problem List Details Patient Name: [name] Date of Service: 03/30/2022 8:30 AM Medical Record Number: [id number] Patient Account Number: [id number] Date of Birth/Sex: September 21, 1956 (65 y.o. M) Treating RN: Huel Coventry Primary Care Provider: Manson Allan  Other Clinician: Referring Provider: Manson Allan Treating Provider/Extender: Tilda Franco in Treatment: 20 Active Problems ICD-10 Encounter Code Description Active Date MDM Diagnosis L97.512 Non-pressure chronic ulcer of other part of right foot with fat layer 11/10/2021 No Yes exposed E11.621 Type 2 diabetes mellitus with foot ulcer 11/10/2021 No Yes E11.40 Type 2 diabetes mellitus with diabetic neuropathy, unspecified 11/10/2021 No Yes R26.89 Other abnormalities of gait and mobility 11/10/2021 No Yes Inactive Problems ICD-10 Code Description Active Date Inactive Date L97.528 Non-pressure chronic ulcer of other part of left foot with other specified 12/01/2021 12/01/2021 severity Resolved Problems Electronic Signature(s) Signed: 03/30/2022 11:38:35 AM By: Geralyn Corwin DO Entered By: Geralyn Corwin on 03/30/2022 10:03:04 [name] ([id number]) -------------------------------------------------------------------------------- Progress Note Details Patient Name: [name] Date of Service: 03/30/2022 8:30 AM Medical Record Number: [id number] Patient Account Number: [id number] Date of Birth/Sex: 1956/08/05 (65 y.o. M) Treating RN: Huel Coventry Primary Care Provider: Manson Allan Other Clinician: Referring Provider: Manson Allan Treating Provider/Extender: Tilda Franco in Treatment: 20 Subjective Chief Complaint Information obtained from Patient Right plantar foot wound History of Present Illness (HPI) Admission 11/10/2021 [name] is a 66 year old male with a past medical history of uncontrolled type 2 diabetes with last hemoglobin A1c of 8.1 complicated by peripheral neuropathy that presents to the clinic for a 2-year history of nonhealing ulcer to the bottom of his right foot. He states this started out as a blister caused by a work boot. He reports receiving wound care when he resided in [place]. He is reestablishing his wound care in  [facility] today. He is currently keeping the area clean and covered. He has insoles designed to help offload the wound bed. He currently denies signs of infection. 1/18; patient presents for follow-up. He has been using Hydrofera Blue to the wound bed. He has no issues or complaints today. He has not received the defender.[name] He denies signs of infection. 2/1; patient presents for follow-up. He has been using Hydrofera Blue to the wound bed he states he received the Char[employer] fundraiser  boot and has been using it however he did not have it on today. He currently denies signs of infection to the right foot. Unfortunately he developed a wound to the left great toe over the past week. He states he received new orthotics and these caused a blister to the left great toe which turned into a wound. He has not been doing anything to the wound bed. He currently denies systemic signs of infection. 2/8; Patient presents for follow-up. 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 He states he is about 90% recovered. He has some mild weakness to the left side. He reports taking the antibiotics prescribed at last clinic visit. He reports using Hydrofera Blue for dressing changes. He only uses the offloading boot when he is at home. He uses open toed shoes to the right foot. He currently denies signs of infection. 2/15; patient presents for follow-up. He states he is still taking the antibiotics prescribed. He reports using the defender boot to the right foot and open toed shoe to the left foot however he is not wearing either today. He currently denies systemic signs of infection. He stated he cut down a tree last week and was outside doing yard work. 2/22; patient presents for follow-up. 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 He reports feeling well. He  reports using the defender boot to the right leg and continues to use Hydrofera Blue with dressing changes. He denies systemic signs of infection. 3/8; patient presents for follow-up. He sees Dr. Alberteen Spindleline tomorrow for follow-up of the left great toe amputation. This was a partial amputation. He reports he is taking Augmentin currently. He reports increased erythema to the left great toe amputation site. He also reports a decline in his right plantar foot wound. He reports it is draining more. He denies purulent drainage. 3/15; patient presents for follow-up. He did not see Dr. Alberteen Spindleline last week but states he sees him today. He states he has been taking doxycycline in the past week and started gentamicin ointment. [facility]. He currently denies systemic signs of infection. 3/22; patient presents for follow-up. Patient had partial amputation of the left great toe on 2/15. 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 Alberteen Spindleline. He reports no issues and has no complaints. He is currently taking Keflex and doxycycline prescribed by Dr. Alberteen Spindleline. He continues to use gentamicin ointment and Hydrofera Blue to the right foot wound. He reports using the defender boot when he is at home. 3/29; patient presents for follow-up. He continues to be on antibiotics per podiatry. He has been using Hydrofera Blue and gentamicin ointment to the right plantar foot wound. He has no issues or complaints today. He denies signs of infection. 4/5; patient presents for follow-up. He has been using Dakin's wet-to-dry dressings with improvement to wound healing. He denies signs of infection. She has no issues or complaints today. 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 Use Hydrofera Blue for a prolonged period of time and has been using Dakin's wet- to-dry I think for the last 2 to 3 weeks. He has a Psychologist, forensicdefender boot at home but he does not wear that  coming into the clinic because he drives. He tells me he has been compliant with the Psychologist, forensicdefender boot  short of when he goes out to drive. He lives alone. He also tells me he has had a previous left great toe amputation We are following him for the right foot wound. 4/26; patient presents for follow-up. He had worsening of his left foot wound and had surgical debridement on 4/16 by Dr. Fanny Skates. He is being followed by podiatry for this issue. He he is on IV Unasyn based on tissue culture by ID.[name] We are following him for the right foot wound. He has been using Dakin's wet-to-dry dressings without issues. He reports continued usage of the defender boot. 5/3; patient presents for follow-up. He has been using Dakin's wet-to-dry dressings to the right foot wound. He has no issues or complaints today. He reports using the Psychologist, forensic. He reports offloading the foot wound when he is at home by sitting in a recliner. He denies signs of infection. He is still getting IV antibiotics For the left foot wound that is being followed by podiatry. He states the end date is later this month on 5/28. [name], [name] ([id number]) 5/17; patient presents for follow-up. He missed his last clinic appointment. He has been using Dakin's wet-to-dry dressings. He states he is offloading the right foot wound with the defender boot although he does not have this today. He is still on IV antibiotics. He has no issues or complaints today. 5/24; patient presents for follow-up. He has been using Hydrofera Blue to the wound bed without issues. He denies signs of infection. At last clinic visit he had a wound culture done that detected high levels of Enterococcus faecalis and low levels of Corynebacterium stratum and Staphylococcus epidermidis. [facility] antibiotics were ordered. He has not heard from the company yet. 5/31; patient presents for follow-up. He continues to use Hydrofera Blue to the wound bed. He denies signs of  infection. [facility] antibiotics were ordered at last clinic visit and he states he called the company to pay for it. He states that it is being shipped. We rediscussed the total contact cast and he declines having this placed today. He states he is wearing the defender boot however he never wears it into the office. Objective Constitutional Vitals Time Taken: 8:37 AM, Height: 74 in, Weight: 226 lbs, BMI: 29, Temperature: 97.6 F, Pulse: 77 bpm, Respiratory Rate: 18 breaths/min, Blood Pressure: 105/68 mmHg. General Notes: Right foot: To the plantar aspect of the first metatarsal head there is an open wound with granulation tissue, biofilm and callus. No surrounding signs of infection. Integumentary (Hair, Skin) Wound #1 status is Open. Original cause of wound was Blister. The date acquired was: 07/15/2020. The wound has been in treatment 20 weeks. The wound is located on the Right,Plantar Foot. The wound measures 0.9cm length x 0.5cm width x 0.5cm depth; 0.353cm^2 area and 0.177cm^3 volume. There is Fat Layer (Subcutaneous Tissue) exposed. There is a medium amount of serosanguineous drainage noted. The wound margin is thickened. There is large (67-100%) red granulation within the wound bed. There is a small (1-33%) amount of necrotic tissue within the wound bed including Adherent Slough. Assessment Active Problems ICD-10 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ed Type 2 diabetes mellitus with foot ulcer Type 2 diabetes mellitus with diabetic neuropathy, unspecified Other abnormalities of gait and mobility Patient's wound is stable. I debrided nonviable tissue. I recommended the total contact cast however he declined this today. I am not sure he is aggressively offloading this area. We discussed the importance of offloading  for his wound healing. I recommended continuing Hydrofera Blue. Once he obtains the [facility] antibiotics he can start  this. Procedures Wound #1 Pre-procedure diagnosis of Wound #1 is a Diabetic Wound/Ulcer of the Lower Extremity located on the Right,Plantar Foot .Severity of Tissue Pre Debridement is: Limited to breakdown of skin. There was a Excisional Skin/Subcutaneous Tissue Debridement with a total area of 0.45 sq cm performed by Geralyn Corwin, MD. With the following instrument(s): Curette to remove Viable and Non-Viable tissue/material. Material removed includes Callus, Subcutaneous Tissue, and Biofilm. No specimens were taken. A time out was conducted at 09:10, prior to the start of the procedure. A Minimum amount of bleeding was controlled with Pressure. The procedure was tolerated well. Post Debridement Measurements: 0.9cm length x 0.8cm width x 0.5cm depth; 0.283cm^3 volume. Character of Wound/Ulcer Post Debridement is stable. Severity of Tissue Post Debridement is: Fat layer exposed. Post procedure Diagnosis Wound #1: Same as Pre-Procedure [name], [name] ([id number]) Plan Follow-up Appointments: Return Appointment in 1 week. Nurse Visit as needed Bathing/ Shower/ Hygiene: May shower; gently cleanse wound with antibacterial soap, rinse and pat dry prior to dressing wounds No tub bath. Anesthetic (Use 'Patient Medications' Section for Anesthetic Order Entry): Lidocaine applied to wound bed Edema Control - Lymphedema / Segmental Compressive Device / Other: Elevate, Exercise Daily and Avoid Standing for Long Periods of Time. Elevate leg(s) parallel to the floor when sitting. DO YOUR BEST to sleep in the bed at night. DO NOT sleep in your recliner. Long hours of sitting in a recliner leads to swelling of the legs and/or potential wounds on your backside. Off-Loading: Foot Defender  - right foot-keep shin covered-keep pressure off of the wound (Patient is not wear boot to clinic each week. Wearing tennis shoe) Additional Orders / Instructions: Follow Nutritious Diet and Increase Protein Intake -  monitor blood sugar to maintain normal limits Other: - Follow up post surgery for left foot-[facility] handling Medications-Please add to medication list.: P.O. Antibiotics - [facility] initiated WOUND #1: - Foot Wound Laterality: Plantar, Right Peri-Wound Care: adhesive donut foam peri wound Every Other Day/30 Days Topical: Mupirocin Ointment Every Other Day/30 Days Discharge Instructions: Apply as directed by provider. in office only Primary Dressing: Hydrofera Blue Ready Transfer Foam, 2.5x2.5 (in/in) Every Other Day/30 Days Discharge Instructions: Apply Hydrofera Blue Ready to wound bed as directed Secondary Dressing: Gauze Every Other Day/30 Days Discharge Instructions: requested at appt due to shoes Secured With: Medipore Tape - 29M Medipore H Soft Cloth Surgical Tape, 2x2 (in/yd) Every Other Day/30 Days Secured With: Conform 4'' - Conforming Stretch Gauze Bandage 4x75 (in/in) Every Other Day/30 Days Discharge Instructions: Apply as directed 1. In office sharp debridement 2. Hydrofera Blue, can change to [place] antibiotics once this arrives 3. Follow-up in 1 week Electronic Signature(s) Signed: 03/30/2022 11:38:35 AM By: Geralyn Corwin DO Entered By: Geralyn Corwin on 03/30/2022 10:07:02 [name] ([id number]) -------------------------------------------------------------------------------- SuperBill Details Patient Name: [name] Date of Service: 03/30/2022 Medical Record Number: [id number] Patient Account Number: [id number] Date of Birth/Sex: Mar 05, 1956 (65 y.o. 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.621 Type 2 diabetes mellitus with foot ulcer E11.40 Type 2 diabetes  mellitus with diabetic neuropathy, unspecified R26.89 Other abnormalities of gait and mobility Facility Procedures CPT4 Code: [id number] Description: 11042 -  DEB SUBQ TISSUE 20 SQ CM/< Modifier: Quantity: 1 CPT4 Code: Description: ICD-10 Diagnosis Description L97.512 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

## 2022-03-31 NOTE — Progress Notes (Signed)
MASSON, NALEPA ([id number]) Visit Report for 03/30/2022 Arrival Information Details Patient Name: [name], [name] Date of Service: 03/30/2022 8:30 AM Medical Record Number: [id number] Patient Account Number: [id number] Date of Birth/Sex: 23-May-1956 (65 y.o. M) Treating RN: Cornell Barman Primary Care Mega Kinkade: [name] Other Clinician: Referring Kota Ciancio: [name] Treating Addilyn Satterwhite/Extender: Yaakov Guthrie in Treatment: 20 Visit Information History Since Last Visit Added or deleted any medications: Yes Patient Arrived: Ambulatory Has Dressing in Place as Prescribed: Yes Arrival Time: 08:32 Pain Present Now: Yes Transfer Assistance: None Patient Identification Verified: Yes Secondary Verification Process Completed: Yes Patient Requires Transmission-Based No Precautions: Patient Has Alerts: Yes Patient Alerts: Patient on Blood Thinner DIABETIC Plavix Electronic Signature(s) Signed: 03/31/2022 9:49:48 AM By: Gretta Cool, BSN, RN, CWS, Kim RN, BSN Entered By: Gretta Cool, BSN, RN, CWS, Kim on 03/30/2022 08:37:15 [name] ([id number]) -------------------------------------------------------------------------------- Encounter Discharge Information Details Patient Name: [name] Date of Service: 03/30/2022 8:30 AM Medical Record Number: [id number] Patient Account Number: [id number] Date of Birth/Sex: [birth year] (65 y.o. M) Treating RN: Cornell Barman Primary Care Marija Calamari: [name] Other Clinician: Referring Lymon Kidney: [name] Treating Arrietty Dercole/Extender: Yaakov Guthrie in Treatment: 20 Encounter Discharge Information Items Post Procedure Vitals Discharge Condition: Stable Temperature (F): 97.6 Ambulatory Status: Ambulatory Pulse (bpm): 77 Discharge Destination: Home Respiratory Rate (breaths/min): 16 Transportation: Private Auto Blood Pressure (mmHg): 105/68 Schedule Follow-up Appointment: Yes Clinical Summary of Care: Electronic  Signature(s) Signed: 03/31/2022 9:49:48 AM By: Gretta Cool, BSN, RN, CWS, Kim RN, BSN Entered By: Gretta Cool, BSN, RN, CWS, Kim on 03/30/2022 09:32:13 [name] ([id number]) -------------------------------------------------------------------------------- Lower Extremity Assessment Details Patient Name: [name] Date of Service: 03/30/2022 8:30 AM Medical Record Number: [id number] Patient Account Number: [id number] Date of Birth/Sex: 01-17-1956 (65 y.o. M) Treating RN: Cornell Barman Primary Care Breck [place]: [name] Other Clinician: Referring Mannie Wineland: [name] Treating Tatsuo Musial/Extender: Yaakov Guthrie in Treatment: 20 Edema Assessment Assessed: [Left: No] [Right: Yes] Edema: [Left: N] [Right: o] Vascular Assessment Pulses: Dorsalis Pedis Palpable: [Right:Yes] Electronic Signature(s) Signed: 03/31/2022 9:49:48 AM By: Gretta Cool, BSN, RN, CWS, Kim RN, BSN Entered By: Gretta Cool, BSN, RN, CWS, Kim on 03/30/2022 08:43:20 [name] ([id number]) -------------------------------------------------------------------------------- Multi Wound Chart Details Patient Name: [name] Date of Service: 03/30/2022 8:30 AM Medical Record Number: [id number] Patient Account Number: [id number] Date of Birth/Sex: June 30, 1956 (65 y.o. M) Treating RN: Cornell Barman Primary Care Josey Forcier: [name] Other Clinician: Referring Osceola Holian: [name] Treating Joella Saefong/Extender: Yaakov Guthrie in Treatment: 20 Vital Signs Height(in): 74 Pulse(bpm): 77 Weight(lbs): 226 Blood Pressure(mmHg): 105/68 Body Mass Index(BMI): 29 Temperature(F): 97.6 Respiratory Rate(breaths/min): 18 Photos: [N/A:N/A] Wound Location: Right, Plantar Foot N/A N/A Wounding Event: Blister N/A N/A Primary Etiology: Diabetic Wound/Ulcer of the Lower N/A N/A Extremity Comorbid History: Type II Diabetes, Osteoarthritis N/A N/A Date Acquired: 07/15/2020 N/A N/A Weeks of Treatment: 20 N/A N/A Wound Status: Open  N/A N/A Wound Recurrence: No N/A N/A Measurements L x W x D (cm) 0.9x0.5x0.5 N/A N/A Area (cm) : 0.353 N/A N/A Volume (cm) : 0.177 N/A N/A % Reduction in Area: 68.60% N/A N/A % Reduction in Volume: 73.70% N/A N/A Classification: Grade 2 N/A N/A Exudate Amount: Medium N/A N/A Exudate Type: Serosanguineous N/A N/A Exudate Color: red, brown N/A N/A Wound Margin: Thickened N/A N/A Granulation Amount: Large (67-100%) N/A N/A Granulation Quality: Red N/A N/A Necrotic Amount: Small (1-33%) N/A N/A Exposed Structures: Fat Layer (Subcutaneous Tissue): N/A N/A Yes Fascia: No Tendon: No Muscle: No Joint: No Bone: No Epithelialization: None N/A N/A Treatment Notes Electronic Signature(s) Signed: 03/31/2022 9:49:48  AM By: Gretta Cool, BSN, RN, CWS, Kim RN, BSN Entered By: Gretta Cool, BSN, RN, CWS, Kim on 03/30/2022 09:15:30 [name] ([id number]) -------------------------------------------------------------------------------- Multi-Disciplinary Care Plan Details Patient Name: [name] Date of Service: 03/30/2022 8:30 AM Medical Record Number: [id number] Patient Account Number: [id number] Date of Birth/Sex: December 28, 1955 (65 y.o. M) Treating RN: Cornell Barman Primary Care Simya Tercero: [name] Other Clinician: Referring Michiah Masse: [name] Treating Norval Slaven/Extender: Yaakov Guthrie in Treatment: 20 Active Inactive Wound/Skin Impairment Nursing Diagnoses: Impaired tissue integrity Knowledge deficit related to smoking impact on wound healing Knowledge deficit related to ulceration/compromised skin integrity Goals: Patient/caregiver will verbalize understanding of skin care regimen Date Initiated: 11/10/2021 Date Inactivated: 12/01/2021 Target Resolution Date: 11/17/2021 Goal Status: Met Ulcer/skin breakdown will have a volume reduction of 30% by week 4 Date Initiated: 11/10/2021 Date Inactivated: 02/02/2022 Target Resolution Date: 12/08/2021 Goal Status: Met Ulcer/skin  breakdown will have a volume reduction of 50% by week 8 Date Initiated: 11/10/2021 Target Resolution Date: 01/05/2022 Goal Status: Active Ulcer/skin breakdown will have a volume reduction of 80% by week 12 Date Initiated: 11/10/2021 Target Resolution Date: 02/02/2022 Goal Status: Active Ulcer/skin breakdown will heal within 14 weeks Date Initiated: 11/10/2021 Target Resolution Date: 02/16/2022 Goal Status: Active Interventions: Assess patient/caregiver ability to obtain necessary supplies Assess patient/caregiver ability to perform ulcer/skin care regimen upon admission and as needed Assess ulceration(s) every visit Notes: Electronic Signature(s) Signed: 03/31/2022 9:49:48 AM By: Gretta Cool, BSN, RN, CWS, Kim RN, BSN Entered By: Gretta Cool, BSN, RN, CWS, Kim on 03/30/2022 09:15:22 [name] ([id number]) -------------------------------------------------------------------------------- Pain Assessment Details Patient Name: [name] Date of Service: 03/30/2022 8:30 AM Medical Record Number: [id number] Patient Account Number: [id number] Date of Birth/Sex: [birth year] (65 y.o. M) Treating RN: Cornell Barman Primary Care Kaspian Muccio: [name] Other Clinician: Referring Braison Snoke: [name] Treating Suresh Audi/Extender: Yaakov Guthrie in Treatment: 20 Active Problems Location of Pain Severity and Description of Pain Patient Has Paino Yes Site Locations Pain Location: Pain in Ulcers Rate the pain. Current Pain Level: 5 Pain Management and Medication Current Pain Management: Electronic Signature(s) Signed: 03/31/2022 9:49:48 AM By: Gretta Cool, BSN, RN, CWS, Kim RN, BSN Entered By: Gretta Cool, BSN, RN, CWS, Kim on 03/30/2022 08:37:45 [name] ([id number]) -------------------------------------------------------------------------------- Patient/Caregiver Education Details Patient Name: [name] Date of Service: 03/30/2022 8:30 AM Medical Record Number: [id number] Patient Account  Number: [id number] Date of Birth/Gender: January 23, 1956 (65 y.o. M) Treating RN: Cornell Barman Primary Care Physician: [name] Other Clinician: Referring Physician: [name] Treating Physician/Extender: Yaakov Guthrie in Treatment: 20 Education Assessment Education Provided To: Patient Education Topics Provided Wound/Skin Impairment: Handouts: Caring for Your Ulcer, Other: continue wound care as prescribed. Methods: Demonstration, Explain/Verbal Responses: State content correctly Electronic Signature(s) Signed: 03/31/2022 9:49:48 AM By: Gretta Cool, BSN, RN, CWS, Kim RN, BSN Entered By: Gretta Cool, BSN, RN, CWS, Kim on 03/30/2022 09:31:04 [name] ([id number]) -------------------------------------------------------------------------------- Wound Assessment Details Patient Name: [name] Date of Service: 03/30/2022 8:30 AM Medical Record Number: [id number] Patient Account Number: [id number] Date of Birth/Sex: [birth year] (65 y.o. M) Treating RN: Cornell Barman Primary Care Kenard Morawski: [name] Other Clinician: Referring Laini Urick: [name] Treating Meggan Dhaliwal/Extender: Yaakov Guthrie in Treatment: 20 Wound Status Wound Number: 1 Primary Etiology: Diabetic Wound/Ulcer of the Lower Extremity Wound Location: Right, Plantar Foot Wound Status: Open Wounding Event: Blister Comorbid History: Type II Diabetes, Osteoarthritis Date Acquired: 07/15/2020 Weeks Of Treatment: 20 Clustered Wound: No Photos Wound Measurements Length: (cm) 0.9 % Reduct Width: (cm) 0.5 % Reduct Depth: (cm) 0.5 Epitheli Area: (cm) 0.353 Volume: (cm) 0.177  ion in Area: 68.6% ion in Volume: 73.7% alization: None Wound Description Classification: Grade 2 Foul Od Wound Margin: Thickened Slough/ Exudate Amount: Medium Exudate Type: Serosanguineous Exudate Color: red, brown or After Cleansing: No Fibrino Yes Wound Bed Granulation Amount: Large (67-100%) Exposed  Structure Granulation Quality: Red Fascia Exposed: No Necrotic Amount: Small (1-33%) Fat Layer (Subcutaneous Tissue) Exposed: Yes Necrotic Quality: Adherent Slough Tendon Exposed: No Muscle Exposed: No Joint Exposed: No Bone Exposed: No Treatment Notes Wound #1 (Foot) Wound Laterality: Plantar, Right Cleanser Peri-Wound Care adhesive donut foam peri wound Topical [place], Dat ([id number]) Mupirocin Ointment Discharge Instruction: Apply as directed by Keerthi Hazell. in office only Primary Dressing Hydrofera Blue Ready Transfer Foam, 2.5x2.5 (in/in) Discharge Instruction: Apply Hydrofera Blue Ready to wound bed as directed Secondary Dressing Gauze Discharge Instruction: requested at appt due to shoes Secured With [employer] Surgical Tape, 2x2 (in/yd) Conform 4'' - Conforming Stretch Gauze Bandage 4x75 (in/in) Discharge Instruction: Apply as directed Compression Wrap Compression Stockings Add-Ons Electronic Signature(s) Signed: 03/31/2022 9:49:48 AM By: Gretta Cool, BSN, RN, CWS, Kim RN, BSN Entered By: Gretta Cool, BSN, RN, CWS, Kim on 03/30/2022 08:42:38 [name] ([id number]) -------------------------------------------------------------------------------- [place] Details Patient Name: [name] Date of Service: 03/30/2022 8:30 AM Medical Record Number: [id number] Patient Account Number: [id number] Date of Birth/Sex: Oct 30, 1956 (65 y.o. M) Treating RN: Cornell Barman Primary Care Honestie Kulik: [name] Other Clinician: Referring Amiera Herzberg: [name] Treating Ammara Raj/Extender: Yaakov Guthrie in Treatment: 20 Vital Signs Time Taken: 08:37 Temperature (F): 97.6 Height (in): 74 Pulse (bpm): 77 Weight (lbs): 226 Respiratory Rate (breaths/min): 18 Body Mass Index (BMI): 29 Blood Pressure (mmHg): 105/68 Reference Range: 80 - 120 mg / dl Electronic Signature(s) Signed: 03/31/2022 9:49:48 AM By: Gretta Cool, BSN, RN, CWS, Kim RN, BSN Entered By:  Gretta Cool, BSN, RN, CWS, Kim on 03/30/2022 08:37:35

## 2022-04-06 ENCOUNTER — Encounter: Payer: Medicare Other | Attending: Internal Medicine | Admitting: Physician Assistant

## 2022-04-06 DIAGNOSIS — L97528 Non-pressure chronic ulcer of other part of left foot with other specified severity: Secondary | ICD-10-CM | POA: Diagnosis not present

## 2022-04-06 DIAGNOSIS — E1140 Type 2 diabetes mellitus with diabetic neuropathy, unspecified: Secondary | ICD-10-CM | POA: Insufficient documentation

## 2022-04-06 DIAGNOSIS — L97512 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ed: Secondary | ICD-10-CM | POA: Insufficient documentation

## 2022-04-06 DIAGNOSIS — E1142 Type 2 diabetes mellitus with diabetic polyneuropathy: Secondary | ICD-10-CM | POA: Diagnosis not present

## 2022-04-06 DIAGNOSIS — Z89412 Acquired absence of left great toe: Secondary | ICD-10-CM | POA: Diagnosis not present

## 2022-04-06 DIAGNOSIS — E11621 Type 2 diabetes mellitus with foot ulcer: Secondary | ICD-10-CM | POA: Diagnosis present

## 2022-04-06 DIAGNOSIS — R2689 Other abnormalities of gait and mobility: Secondary | ICD-10-CM | POA: Insufficient documentation

## 2022-04-06 NOTE — Progress Notes (Addendum)
[name] ([id number]) Visit Report for 04/06/2022 Arrival Information Details Patient Name: [name] Date of Service: 04/06/2022 9:15 AM Medical Record Number: [id number] Patient Account Number: [id number] Date of Birth/Sex: 1956/09/25 (65 y.o. M) Treating RN: Levora Dredge Primary Care Kimberlea Schlag: [name] Other Clinician: Referring Gordie Crumby: [name] Treating Merwyn Hodapp/Extender: Skipper Cliche in Treatment: 21 Visit Information History Since Last Visit Added or deleted any medications: No Patient Arrived: Ambulatory Any new allergies or adverse reactions: No Arrival Time: 09:12 Signs or symptoms of abuse/neglect since last visito No Transfer Assistance: None Hospitalized since last visit: No Patient Requires Transmission-Based No Precautions: Pain Present Now: Yes Patient Has Alerts: Yes Patient Alerts: Patient on Blood Thinner DIABETIC Plavix Electronic Signature(s) Signed: 04/08/2022 4:23:30 PM By: Massie Kluver Entered By: Massie Kluver on 04/06/2022 09:12:26 [name] ([id number]) -------------------------------------------------------------------------------- Clinic Level of Care Assessment Details Patient Name: [name] Date of Service: 04/06/2022 9:15 AM Medical Record Number: [id number] Patient Account Number: [id number] Date of Birth/Sex: [birth year] (65 y.o. M) Treating RN: Levora Dredge Primary Care Chelly Dombeck: [name] Other Clinician: Referring Leonides Minder: [name] Treating Keshawn Sundberg/Extender: Skipper Cliche in Treatment: 21 Clinic Level of Care Assessment Items TOOL 1 Quantity Score []  - Use when EandM and Procedure is performed on INITIAL visit 0 ASSESSMENTS - Nursing Assessment / Reassessment []  - General Physical Exam (combine w/ comprehensive assessment (listed just below) when performed on new 0 pt. evals) []  - 0 Comprehensive Assessment (HX, ROS, Risk Assessments, Wounds Hx, etc.) ASSESSMENTS - Wound and  Skin Assessment / Reassessment []  - Dermatologic / Skin Assessment (not related to wound area) 0 ASSESSMENTS - Ostomy and/or Continence Assessment and Care []  - Incontinence Assessment and Management 0 []  - 0 Ostomy Care Assessment and Management (repouching, etc.) PROCESS - Coordination of Care []  - Simple Patient / Family Education for ongoing care 0 []  - 0 Complex (extensive) Patient / Family Education for ongoing care []  - 0 Staff obtains Programmer, systems, Records, Test Results / Process Orders []  - 0 Staff telephones HHA, Nursing Homes / Clarify orders / etc []  - 0 Routine Transfer to another Facility (non-emergent condition) []  - 0 Routine Hospital Admission (non-emergent condition) []  - 0 New Admissions / Biomedical engineer / Ordering NPWT, Apligraf, etc. []  - 0 Emergency Hospital Admission (emergent condition) PROCESS - Special Needs []  - Pediatric / Minor Patient Management 0 []  - 0 Isolation Patient Management []  - 0 Hearing / Language / Visual special needs []  - 0 Assessment of Community assistance (transportation, D/C planning, etc.) []  - 0 Additional assistance / Altered mentation []  - 0 Support Surface(s) Assessment (bed, cushion, seat, etc.) INTERVENTIONS - Miscellaneous []  - External ear exam 0 []  - 0 Patient Transfer (multiple staff / Civil Service fast streamer / Similar devices) []  - 0 Simple Staple / Suture removal (25 or less) []  - 0 Complex Staple / Suture removal (26 or more) []  - 0 Hypo/Hyperglycemic Management (do not check if billed separately) []  - 0 Ankle / Brachial Index (ABI) - do not check if billed separately Has the patient been seen at the hospital within the last three years: Yes Total Score: 0 Level Of Care: ____ [name] ([id number]) Electronic Signature(s) Signed: 04/08/2022 4:23:30 PM By: Massie Kluver Entered By: Massie Kluver on 04/06/2022 09:57:27 [name]  ([id number]) -------------------------------------------------------------------------------- Encounter Discharge Information Details Patient Name: [name] Date of Service: 04/06/2022 9:15 AM Medical Record Number: [id number] Patient Account Number: [id number] Date of Birth/Sex: 1956/02/17 (65 y.o. M) Treating RN: Levora Dredge Primary Care  Lennox Dolberry: [name] Other Clinician: Referring Ivelise Castillo: [name] Treating Kaeli Nichelson/Extender: Skipper Cliche in Treatment: 21 Encounter Discharge Information Items Post Procedure Vitals Discharge Condition: Stable Temperature (F): 97.9 Ambulatory Status: Ambulatory Pulse (bpm): 72 Discharge Destination: Home Respiratory Rate (breaths/min): 18 Transportation: Private Auto Blood Pressure (mmHg): 121/75 Schedule Follow-up Appointment: Yes Clinical Summary of Care: Electronic Signature(s) Signed: 04/08/2022 4:23:30 PM By: Massie Kluver Entered By: Massie Kluver on 04/06/2022 09:59:24 [name] ([id number]) -------------------------------------------------------------------------------- Lower Extremity Assessment Details Patient Name: [name] Date of Service: 04/06/2022 9:15 AM Medical Record Number: [id number] Patient Account Number: [id number] Date of Birth/Sex: 04/07/1956 (65 y.o. M) Treating RN: Levora Dredge Primary Care Daylen Hack: [name] Other Clinician: Referring Stephanee Barcomb: [name] Treating Kerby Hockley/Extender: Jeri Cos Weeks in Treatment: 21 Electronic Signature(s) Signed: 04/06/2022 4:16:45 PM By: Levora Dredge Signed: 04/08/2022 4:23:30 PM By: Massie Kluver Entered By: Massie Kluver on 04/06/2022 09:21:25 [name] ([id number]) -------------------------------------------------------------------------------- Multi Wound Chart Details Patient Name: [name] Date of Service: 04/06/2022 9:15 AM Medical Record Number: [id number] Patient Account Number: [id number] Date of Birth/Sex:  1956-05-01 (65 y.o. M) Treating RN: Levora Dredge Primary Care Nabiha Planck: [name] Other Clinician: Referring Carlean Crowl: [name] Treating Jashayla Glatfelter/Extender: Skipper Cliche in Treatment: 21 Vital Signs Height(in): 74 Pulse(bpm): 72 Weight(lbs): 226 Blood Pressure(mmHg): 121/75 Body Mass Index(BMI): 29 Temperature(F): 97.9 Respiratory Rate(breaths/min): 18 Photos: [N/A:N/A] Wound Location: Right, Plantar Foot N/A N/A Wounding Event: Blister N/A N/A Primary Etiology: Diabetic Wound/Ulcer of the Lower N/A N/A Extremity Comorbid History: Type II Diabetes, Osteoarthritis N/A N/A Date Acquired: 07/15/2020 N/A N/A Weeks of Treatment: 21 N/A N/A Wound Status: Open N/A N/A Wound Recurrence: No N/A N/A Measurements L x W x D (cm) 0.9x0.6x0.3 N/A N/A Area (cm) : 0.424 N/A N/A Volume (cm) : 0.127 N/A N/A % Reduction in Area: 62.20% N/A N/A % Reduction in Volume: 81.20% N/A N/A Classification: Grade 2 N/A N/A Exudate Amount: Medium N/A N/A Exudate Type: Serosanguineous N/A N/A Exudate Color: red, brown N/A N/A Wound Margin: Thickened N/A N/A Granulation Amount: Large (67-100%) N/A N/A Granulation Quality: Red N/A N/A Necrotic Amount: Small (1-33%) N/A N/A Exposed Structures: Fat Layer (Subcutaneous Tissue): N/A N/A Yes Fascia: No Tendon: No Muscle: No Joint: No Bone: No Epithelialization: None N/A N/A Treatment Notes Electronic Signature(s) Signed: 04/08/2022 4:23:30 PM By: Massie Kluver Entered By: Massie Kluver on 04/06/2022 09:21:37 [name] ([id number]) -------------------------------------------------------------------------------- [place] Details Patient Name: [name] Date of Service: 04/06/2022 9:15 AM Medical Record Number: [id number] Patient Account Number: [id number] Date of Birth/Sex: [birth year] (65 y.o. M) Treating RN: Levora Dredge Primary Care Hope Brandenburger: [name] Other Clinician: Referring Ruhama Lehew:  [name] Treating Sriyan Cutting/Extender: Skipper Cliche in Treatment: 21 Active Inactive Wound/Skin Impairment Nursing Diagnoses: Impaired tissue integrity Knowledge deficit related to smoking impact on wound healing Knowledge deficit related to ulceration/compromised skin integrity Goals: Patient/caregiver will verbalize understanding of skin care regimen Date Initiated: 11/10/2021 Date Inactivated: 12/01/2021 Target Resolution Date: 11/17/2021 Goal Status: Met Ulcer/skin breakdown will have a volume reduction of 30% by week 4 Date Initiated: 11/10/2021 Date Inactivated: 02/02/2022 Target Resolution Date: 12/08/2021 Goal Status: Met Ulcer/skin breakdown will have a volume reduction of 50% by week 8 Date Initiated: 11/10/2021 Target Resolution Date: 01/05/2022 Goal Status: Active Ulcer/skin breakdown will have a volume reduction of 80% by week 12 Date Initiated: 11/10/2021 Target Resolution Date: 02/02/2022 Goal Status: Active Ulcer/skin breakdown will heal within 14 weeks Date Initiated: 11/10/2021 Target Resolution Date: 02/16/2022 Goal Status: Active Interventions: Assess patient/caregiver ability to obtain necessary  supplies Assess patient/caregiver ability to perform ulcer/skin care regimen upon admission and as needed Assess ulceration(s) every visit Notes: Electronic Signature(s) Signed: 04/06/2022 4:16:45 PM By: Levora Dredge Signed: 04/08/2022 4:23:30 PM By: Massie Kluver Entered By: Massie Kluver on 04/06/2022 09:21:30 [name] ([id number]) -------------------------------------------------------------------------------- Pain Assessment Details Patient Name: [name] Date of Service: 04/06/2022 9:15 AM Medical Record Number: [id number] Patient Account Number: [id number] Date of Birth/Sex: [birth year] (65 y.o. M) Treating RN: Levora Dredge Primary Care Reuben Knoblock: [name] Other Clinician: Referring Emmanuell Kantz: [name] Treating Dezaray Shibuya/Extender:  Skipper Cliche in Treatment: 21 Active Problems Location of Pain Severity and Description of Pain Patient Has Paino Yes Site Locations Pain Location: Generalized Pain Duration of the Pain. Constant / Intermittento Constant Rate the pain. Current Pain Level: 5 Character of Pain Describe the Pain: Throbbing Pain Management and Medication Current Pain Management: Rest: Yes Electronic Signature(s) Signed: 04/06/2022 4:16:45 PM By: Levora Dredge Signed: 04/08/2022 4:23:30 PM By: Massie Kluver Entered By: Massie Kluver on 04/06/2022 09:15:18 [name] ([id number]) -------------------------------------------------------------------------------- Patient/Caregiver Education Details Patient Name: [name] Date of Service: 04/06/2022 9:15 AM Medical Record Number: [id number] Patient Account Number: [id number] Date of Birth/Gender: 11/07/1955 (65 y.o. M) Treating RN: Levora Dredge Primary Care Physician: [name] Other Clinician: Referring Physician: [name] Treating Physician/Extender: Skipper Cliche in Treatment: 21 Education Assessment Education Provided To: Patient Education Topics Provided Wound/Skin Impairment: Handouts: Other: continue wound care as directed Electronic Signature(s) Signed: 04/08/2022 4:23:30 PM By: Massie Kluver Entered By: Massie Kluver on 04/06/2022 09:58:11 [name] ([id number]) -------------------------------------------------------------------------------- Wound Assessment Details Patient Name: [name] Date of Service: 04/06/2022 9:15 AM Medical Record Number: [id number] Patient Account Number: [id number] Date of Birth/Sex: 1956/10/08 (65 y.o. M) Treating RN: Levora Dredge Primary Care Shama Monfils: [name] Other Clinician: Referring Lexi Conaty: [name] Treating Rylie Knierim/Extender: Skipper Cliche in Treatment: 21 Wound Status Wound Number: 1 Primary Etiology: Diabetic Wound/Ulcer of the Lower  Extremity Wound Location: Right, Plantar Foot Wound Status: Open Wounding Event: Blister Comorbid History: Type II Diabetes, Osteoarthritis Date Acquired: 07/15/2020 Weeks Of Treatment: 21 Clustered Wound: No Photos Wound Measurements Length: (cm) 0.9 Width: (cm) 0.6 Depth: (cm) 0.3 Area: (cm) 0.424 Volume: (cm) 0.127 % Reduction in Area: 62.2% % Reduction in Volume: 81.2% Epithelialization: None Wound Description Classification: Grade 2 Wound Margin: Thickened Exudate Amount: Medium Exudate Type: Serosanguineous Exudate Color: red, brown Foul Odor After Cleansing: No Slough/Fibrino Yes Wound Bed Granulation Amount: Large (67-100%) Exposed Structure Granulation Quality: Red Fascia Exposed: No Necrotic Amount: Small (1-33%) Fat Layer (Subcutaneous Tissue) Exposed: Yes Necrotic Quality: Adherent Slough Tendon Exposed: No Muscle Exposed: No Joint Exposed: No Bone Exposed: No Treatment Notes Wound #1 (Foot) Wound Laterality: Plantar, Right Cleanser Normal Saline Discharge Instruction: Wash your hands with soap and water. Remove old dressing, discard into plastic bag and place into trash. Cleanse the wound with Normal Saline prior to applying a clean dressing using gauze sponges, not tissues or cotton balls. Do not scrub or use excessive force. Pat dry using gauze sponges, not tissue or cotton balls. RORY, XIANG ([id number]) Soap and Water Discharge Instruction: Gently cleanse wound with antibacterial soap, rinse and pat dry prior to dressing wounds Peri-Wound Care Topical keystone antibiotic Primary Dressing Hydrofera Blue Ready Transfer Foam, 2.5x2.5 (in/in) Discharge Instruction: Apply Hydrofera Blue Ready to wound bed as directed Secondary Dressing Gauze Discharge Instruction: As directed: dry, moistened with saline or moistened with Dakins Solution Secured With Conform 4'' - Conforming Stretch Gauze Bandage 4x75 (in/in) Discharge Instruction: Apply as  directed Compression Wrap Compression Stockings Add-Ons Electronic Signature(s) Signed: 04/06/2022 4:16:45 PM By: Levora Dredge Signed: 04/08/2022 4:23:30 PM By: Massie Kluver Entered By: Massie Kluver on 04/06/2022 09:21:10 [name] ([id number]) -------------------------------------------------------------------------------- Vitals Details Patient Name: [name] Date of Service: 04/06/2022 9:15 AM Medical Record Number: [id number] Patient Account Number: [id number] Date of Birth/Sex: [birth year] (65 y.o. M) Treating RN: Levora Dredge Primary Care Lathaniel Legate: [name] Other Clinician: Referring Sophira Rumler: [name] Treating Tatiyanna Lashley/Extender: Skipper Cliche in Treatment: 21 Vital Signs Time Taken: 09:12 Temperature (F): 97.9 Height (in): 74 Pulse (bpm): 72 Weight (lbs): 226 Respiratory Rate (breaths/min): 18 Body Mass Index (BMI): 29 Blood Pressure (mmHg): 121/75 Reference Range: 80 - 120 mg / dl Electronic Signature(s) Signed: 04/08/2022 4:23:30 PM By: Massie Kluver Entered By: Massie Kluver on 04/06/2022 09:14:26

## 2022-04-06 NOTE — Progress Notes (Addendum)
[name], [name] ([id number]) Visit Report for 04/06/2022 Chief Complaint Document Details Patient Name: [name], [name] Date of Service: 04/06/2022 9:15 AM Medical Record Number: [id number] Patient Account Number: [id number] Date of Birth/Sex: January 20, 1956 (66 y.o. M) Treating RN: Angelina Pih Primary Care Provider: Manson Allan Other Clinician: Referring Provider: Manson Allan Treating Provider/Extender: Rowan Blase in Treatment: 21 Information Obtained from: Patient Chief Complaint Right plantar foot wound Electronic Signature(s) Signed: 04/06/2022 9:10:19 AM By: Lenda Kelp PA-C Entered By: Lenda Kelp on 04/06/2022 09:10:19 [name] ([id number]) -------------------------------------------------------------------------------- Debridement Details Patient Name: [name] Date of Service: 04/06/2022 9:15 AM Medical Record Number: [id number] Patient Account Number: [id number] Date of Birth/Sex: 10/15/1956 (66 y.o. M) Treating RN: Angelina Pih Primary Care Provider: Manson Allan Other Clinician: Referring Provider: Manson Allan Treating Provider/Extender: Rowan Blase in Treatment: 21 Debridement Performed for Wound #1 Right,Plantar Foot Assessment: Performed By: Physician Nelida Meuse., PA-C Debridement Type: Debridement Severity of Tissue Pre Debridement: Fat layer exposed Level of Consciousness (Pre- Awake and Alert procedure): Pre-procedure Verification/Time Out Yes - 09:41 Taken: Start Time: 09:41 Total Area Debrided (L x W): 1.5 (cm) x 1.5 (cm) = 2.25 (cm) Tissue and other material Callus, Slough, Subcutaneous, Slough debrided: Level: Skin/Subcutaneous Tissue Debridement Description: Excisional Instrument: Curette Bleeding: Minimum Hemostasis Achieved: Pressure Response to Treatment: Procedure was tolerated well Level of Consciousness (Post- Awake and Alert procedure): Post Debridement Measurements of Total Wound Length: (cm)  0.9 Width: (cm) 0.6 Depth: (cm) 0.4 Volume: (cm) 0.17 Character of Wound/Ulcer Post Debridement: Stable Severity of Tissue Post Debridement: Fat layer exposed Post Procedure Diagnosis Same as Pre-procedure Electronic Signature(s) Signed: 04/06/2022 4:16:45 PM By: Angelina Pih Signed: 04/07/2022 4:11:43 PM By: Lenda Kelp PA-C Signed: 04/08/2022 4:23:30 PM By: Betha Loa Entered By: Betha Loa on 04/06/2022 09:45:41 [name] ([id number]) -------------------------------------------------------------------------------- HPI Details Patient Name: [name] Date of Service: 04/06/2022 9:15 AM Medical Record Number: [id number] Patient Account Number: [id number] Date of Birth/Sex: 08-Feb-1956 (66 y.o. M) Treating RN: Angelina Pih Primary Care Provider: Manson Allan Other Clinician: Referring Provider: Manson Allan Treating Provider/Extender: Rowan Blase in Treatment: 21 History of Present Illness HPI Description: Admission 11/10/2021 [name] is a 66 year old male with a past medical history of uncontrolled type 2 diabetes with last hemoglobin A1c of 8.1 complicated by peripheral neuropathy that presents to the clinic for a 2-year history of nonhealing ulcer to the bottom of his right foot. He states this started out as a blister caused by a work boot. He reports receiving wound care when he resided in [place]. He is reestablishing his wound care in [facility] today. He is currently keeping the area clean and covered. He has insoles designed to help offload the wound bed. He currently denies signs of infection. 1/18; patient presents for follow-up. He has been using Hydrofera Blue to the wound bed. He has no issues or complaints today. He has not received the defender.[name] He denies signs of infection. 2/1; patient presents for follow-up. He has been using Hydrofera Blue to the wound bed he states he received the defender boot and has  been using it however he did not have it on today. He currently denies signs of infection to the right foot. Unfortunately he developed a wound to the left great toe over the past week. He states he received new orthotics and these caused a blister to the left great toe which turned into a wound. He has not been doing anything to the wound bed. He  currently denies systemic signs of infection. 2/8; Patient presents for follow-up. 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 He states he is about 90% recovered. He has some mild weakness to the left side. He reports taking the antibiotics prescribed at last clinic visit. He reports using Hydrofera Blue for dressing changes. He only uses the offloading boot when he is at home. He uses open toed shoes to the right foot. He currently denies signs of infection. 2/15; patient presents for follow-up. He states he is still taking the antibiotics prescribed. He reports using the defender boot to the right foot and open toed shoe to the left foot however he is not wearing either today. He currently denies systemic signs of infection. He stated he cut down a tree last week and was outside doing yard work. 2/22; patient presents for follow-up. 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 He reports feeling well. He reports using the defender boot to the right leg and continues to use Hydrofera Blue with dressing changes. He denies systemic signs of infection. 3/8; patient presents for follow-up. He sees Dr. Alberteen Spindle tomorrow for follow-up of the left great toe amputation. This was a partial amputation. He reports he is taking Augmentin currently. He reports increased erythema to the left great toe amputation site. He also reports a decline in his right plantar foot wound. He reports it is draining more. He denies purulent  drainage. 3/15; patient presents for follow-up. He did not see Dr. Alberteen Spindle last week but states he sees him today. He states he has been taking doxycycline in the past week and started gentamicin ointment. He continues to use [facility] with dressing changes. He currently denies systemic signs of infection. 3/22; patient presents for follow-up. Patient had partial amputation of the left great toe on 2/15. 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 Alberteen Spindle. He reports no issues and has no complaints. He is currently taking Keflex and doxycycline prescribed by Dr. Alberteen Spindle. He continues to use gentamicin ointment and Hydrofera Blue to the right foot wound. He reports using the defender boot when he is at home. 3/29; patient presents for follow-up. He continues to be on antibiotics per podiatry. He has been using Hydrofera Blue and gentamicin ointment to the right plantar foot wound. He has no issues or complaints today. He denies signs of infection. 4/5; patient presents for follow-up. He has been using Dakin's wet-to-dry dressings with improvement to wound healing. He denies signs of infection. She has no issues or complaints today. 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 Use Hydrofera Blue for a prolonged period of time and has been using Dakin's wet- to-dry I think for the last 2 to 3 weeks. He has a Psychologist, forensic at home but he does not wear that coming into the clinic because he drives. He tells me he has been compliant with the defender boot short of when he goes out to drive. He lives alone. He also tells me he has had a previous left great toe amputation We are following him for the right foot wound. 4/26; patient presents for follow-up. He had worsening of his left foot wound and had surgical debridement on 4/16 by Dr. Fanny Skates. He is being followed by podiatry for this issue. He he is on IV Unasyn based  on tissue culture by ID.[name] We are  following him for the right foot wound. He has been using Dakin's wet-to-dry dressings without issues. He reports continued usage of the defender boot. 5/3; patient presents for follow-up. He has been using Dakin's wet-to-dry dressings to the right foot wound. He has no issues or complaints today. He reports using the Psychologist, forensic. He reports offloading the foot wound when he is at home by sitting in a recliner. He denies signs of infection. He is still getting IV antibiotics For the left foot wound that is being followed by podiatry. He states the end date is later this month on 5/28. 5/17; patient presents for follow-up. He missed his last clinic appointment. He has been using Dakin's wet-to-dry dressings. He states he is offloading the right foot wound with the defender boot although he does not have this today. He is still on IV antibiotics. He has no issues or complaints today. 5/24; patient presents for follow-up. He has been using Hydrofera Blue to the wound bed without issues. He denies signs of infection. At last clinic [facility] ([id number]) visit he had a wound culture done that detected high levels of Enterococcus faecalis and low levels of Corynebacterium stratum and Staphylococcus epidermidis. Keystone antibiotics were ordered. He has not heard from the company yet. 5/31; patient presents for follow-up. He continues to use Hydrofera Blue to the wound bed. He denies signs of infection. Keystone antibiotics were ordered at last clinic visit and he states he called the company to pay for it. He states that it is being shipped. We rediscussed the total contact cast and he declines having this placed today. He states he is wearing the defender boot however he never wears it into the office. 04-06-2022 upon evaluation today patient appears to be doing about the same in regard to his wound. This is not significantly smaller. He is not wearing his offloading boot. We discussed that today he  tells me that he wears it "all the time as he chuckled and does not have it on during the office visit today. I think it is increasingly obvious that he is just messing around when he says this based on what I am seeing and otherwise I do not know why he would wear it all the time but not to the clinic. Either way my opinion based on what I am seeing currently is simply that the wound does seem to be still having quite a bit of callus buildup around the edges it is also very dry. He does tell me that he has been using the [facility] since he got it and to be honest I think this is good and should hopefully help keep things under control but at the same time I do not see any evidence of active infection. Again the Jodie Echevaria is a topical antibiotic with multiple compounds to help prevent further infection and worsening overall. Electronic Signature(s) Signed: 04/06/2022 10:19:42 AM By: Lenda Kelp PA-C Entered By: Lenda Kelp on 04/06/2022 10:19:42 [name] ([id number]) -------------------------------------------------------------------------------- Physical Exam Details Patient Name: [name] Date of Service: 04/06/2022 9:15 AM Medical Record Number: [id number] Patient Account Number: [id number] Date of Birth/Sex: 1956/09/03 (66 y.o. M) Treating RN: Angelina Pih Primary Care Provider: Manson Allan Other Clinician: Referring Provider: Manson Allan Treating Provider/Extender: Rowan Blase in Treatment: 21 Constitutional Well-nourished and well-hydrated in no acute distress. Respiratory normal breathing without difficulty. Psychiatric this patient is able to make decisions and demonstrates good insight into disease  process. Alert and Oriented x 3. pleasant and cooperative. Notes Again the kneeUpon inspection patient's wound bed actually showed signs of for sharp debridement I did perform debridement to clear away significant callus as well as slough and biofilm on the  surface of the wound down to good subcutaneous tissue he tolerated that today without complication postdebridement wound bed appears to be doing better. Electronic Signature(s) Signed: 04/06/2022 10:20:01 AM By: Lenda Kelp PA-C Entered By: Lenda Kelp on 04/06/2022 10:20:01 [name] ([id number]) -------------------------------------------------------------------------------- Physician Orders Details Patient Name: [name] Date of Service: 04/06/2022 9:15 AM Medical Record Number: [id number] Patient Account Number: [id number] Date of Birth/Sex: 02/15/1956 (66 y.o. M) Treating RN: Angelina Pih Primary Care Provider: Manson Allan Other Clinician: Referring Provider: Manson Allan Treating Provider/Extender: Rowan Blase in Treatment: 21 Verbal / Phone Orders: No Diagnosis Coding ICD-10 Coding Code Description [phone] 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ed E11.621 Type 2 diabetes mellitus with foot ulcer E11.40 Type 2 diabetes mellitus with diabetic neuropathy, unspecified R26.89 Other abnormalities of gait and mobility Follow-up Appointments o Return Appointment in 1 week. o Nurse Visit as needed Bathing/ Shower/ Hygiene o May shower; gently cleanse wound with antibacterial soap, rinse and pat dry prior to dressing wounds o No tub bath. Anesthetic (Use 'Patient Medications' Section for Anesthetic Order Entry) o Lidocaine applied to wound bed Edema Control - Lymphedema / Segmental Compressive Device / Other o Elevate, Exercise Daily and Avoid Standing for Long Periods of Time. o Elevate leg(s) parallel to the floor when sitting. o DO YOUR BEST to sleep in the bed at night. DO NOT sleep in your recliner. Long hours of sitting in a recliner leads to swelling of the legs and/or potential wounds on your backside. Off-Loading o Foot Defender o - right foot-keep shin covered-keep pressure off of the wound  (Patient is not wear boot to clinic each week. Wearing tennis shoe) Additional Orders / Instructions o Follow Nutritious Diet and Increase Protein Intake - monitor blood sugar to maintain normal limits o Other: - Follow up post surgery for left foot-[facility] handling Medications-Please add to medication list. o P.O. Antibiotics - apply Keystone cream every other day Wound Treatment Wound #1 - Foot Wound Laterality: Plantar, Right Cleanser: Normal Saline Discharge Instructions: Wash your hands with soap and water. Remove old dressing, discard into plastic bag and place into trash. Cleanse the wound with Normal Saline prior to applying a clean dressing using gauze sponges, not tissues or cotton balls. Do not scrub or use excessive force. Pat dry using gauze sponges, not tissue or cotton balls. Cleanser: Soap and Water Discharge Instructions: Gently cleanse wound with antibacterial soap, rinse and pat dry prior to dressing wounds Topical: keystone antibiotic Primary Dressing: Hydrofera Blue Ready Transfer Foam, 2.5x2.5 (in/in) Discharge Instructions: Apply Hydrofera Blue Ready to wound bed as directed Secondary Dressing: Gauze Discharge Instructions: As directed: dry, moistened with saline or moistened with Dakins Solution Secured With: Conform 4'' - Conforming Stretch Gauze Bandage 4x75 (in/in) Crace, Reinhard ([id number]) Discharge Instructions: Apply as directed Electronic Signature(s) Signed: 04/07/2022 4:11:43 PM By: Lenda Kelp PA-C Signed: 04/08/2022 4:23:30 PM By: Betha Loa Entered By: Betha Loa on 04/06/2022 09:57:17 [name] ([id number]) -------------------------------------------------------------------------------- Problem List Details Patient Name: [name] Date of Service: 04/06/2022 9:15 AM Medical Record Number: [id number] Patient Account Number: [id number] Date of Birth/Sex: 28-Oct-1956 (66 y.o. M) Treating RN: Angelina Pih Primary Care  Provider: Manson Allan Other Clinician:  Referring Provider: Manson AllanFields, Glenda Treating Provider/Extender: Rowan BlaseStone, Tessla Spurling Weeks in Treatment: 21 Active Problems ICD-10 Encounter Code Description Active Date MDM Diagnosis L97.512 Non-pressure chronic ulcer of other part of right foot with fat layer 11/10/2021 No Yes exposed E11.621 Type 2 diabetes mellitus with foot ulcer 11/10/2021 No Yes E11.40 Type 2 diabetes mellitus with diabetic neuropathy, unspecified 11/10/2021 No Yes R26.89 Other abnormalities of gait and mobility 11/10/2021 No Yes Inactive Problems ICD-10 Code Description Active Date Inactive Date L97.528 Non-pressure chronic ulcer of other part of left foot with other specified 12/01/2021 12/01/2021 severity Resolved Problems Electronic Signature(s) Signed: 04/06/2022 9:10:16 AM By: Lenda KelpStone III, Oaklynn Stierwalt PA-C Entered By: Lenda KelpStone III, Jerrie Gullo on 04/06/2022 09:10:16 [name] MatinNEELY, Domingo ([id number]) -------------------------------------------------------------------------------- Progress Note Details Patient Name: [name] MatinNEELY, Kodey Date of Service: 04/06/2022 9:15 AM Medical Record Number: [id number] Patient Account Number: [id number] Date of Birth/Sex: 1955/11/12 (66 y.o. M) Treating RN: Angelina PihGordon, Caitlin Primary Care Provider: Manson AllanFields, Glenda Other Clinician: Referring Provider: Manson AllanFields, Glenda Treating Provider/Extender: Rowan BlaseStone, Indiah Heyden Weeks in Treatment: 21 Subjective Chief Complaint Information obtained from Patient Right plantar foot wound History of Present Illness (HPI) Admission 11/10/2021 [name] is a 66 year old male with a past medical history of uncontrolled type 2 diabetes with last hemoglobin A1c of 8.1 complicated by peripheral neuropathy that presents to the clinic for a 2-year history of nonhealing ulcer to the bottom of his right foot. He states this started out as a blister caused by a work boot. He reports receiving wound care when [place]. He [facility]. He is currently keeping the area clean and covered. He has insoles designed to help offload the wound bed. He currently denies signs of infection. 1/18; patient presents for follow-up. He has been using Hydrofera Blue to the wound bed. He has no issues or complaints today. He has not received the defender.[name]. He denies signs of infection. 2/1; patient presents for follow-up. He has been using Hydrofera Blue to the wound bed he states he received the defender boot and has been using it however he did not have it on today. He currently denies signs of infection to the right foot. Unfortunately he developed a wound to the left great toe over the past week. He states he received new orthotics and these caused a blister to the left great toe which turned into a wound. He has not been doing anything to the wound bed. He currently denies systemic signs of infection. 2/8; Patient presents for follow-up. 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 He states he is about 90% recovered. He has some mild weakness to the left side. He reports taking the antibiotics prescribed at last clinic visit. He reports using Hydrofera Blue for dressing changes. He only uses the offloading boot when he is at home. He uses open toed shoes to the right foot. He currently denies signs of infection. 2/15; patient presents for follow-up. He states he is still taking the antibiotics prescribed. He reports using the defender boot to the right foot and open toed shoe to the left foot however he is not wearing either today. He currently denies systemic signs of infection. He stated he cut down a tree last week and was outside doing yard work. 2/22; patient presents for follow-up. 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 He  reports feeling well.  He reports using the defender boot to the right leg and continues to use Hydrofera Blue with dressing changes. He denies systemic signs of infection. 3/8; patient presents for follow-up. He sees Dr. Alberteen Spindle tomorrow for follow-up of the left great toe amputation. This was a partial amputation. He reports he is taking Augmentin currently. He reports increased erythema to the left great toe amputation site. He also reports a decline in his right plantar foot wound. He reports it is draining more. He denies purulent drainage. 3/15; patient presents for follow-up. He did not see Dr. Alberteen Spindle last week but states he sees him today. He states he has been taking doxycycline in the past week and started gentamicin ointment. He continues to use [facility] with dressing changes. He currently denies systemic signs of infection. 3/22; patient presents for follow-up. Patient had partial amputation of the left great toe on 2/15. 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 Alberteen Spindle. He reports no issues and has no complaints. He is currently taking Keflex and doxycycline prescribed by Dr. Alberteen Spindle. He continues to use gentamicin ointment and Hydrofera Blue to the right foot wound. He reports using the defender boot when he is at home. 3/29; patient presents for follow-up. He continues to be on antibiotics per podiatry. He has been using Hydrofera Blue and gentamicin ointment to the right plantar foot wound. He has no issues or complaints today. He denies signs of infection. 4/5; patient presents for follow-up. He has been using Dakin's wet-to-dry dressings with improvement to wound healing. He denies signs of infection. She has no issues or complaints today. 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 Use Hydrofera Blue for a prolonged period of time and has been using Dakin's wet- to-dry I think for the last 2 to 3 weeks. He has a Psychologist, forensic at home but he does not wear  that coming into the clinic because he drives. He tells me he has been compliant with the defender boot short of when he goes out to drive. He lives alone. He also tells me he has had a previous left great toe amputation We are following him for the right foot wound. 4/26; patient presents for follow-up. He had worsening of his left foot wound and had surgical debridement on 4/16 by Dr. Fanny Skates. He is being followed by podiatry for this issue. He he is on IV Unasyn based on tissue culture by ID.[name] We are following him for the right foot wound. He has been using Dakin's wet-to-dry dressings without issues. He reports continued usage of the defender boot. 5/3; patient presents for follow-up. He has been using Dakin's wet-to-dry dressings to the right foot wound. He has no issues or complaints today. He reports using the Psychologist, forensic. He reports offloading the foot wound when he is at home by sitting in a recliner. He denies signs of infection. He is still getting IV antibiotics For the left foot wound that is being followed by podiatry. He states the end date is later this month on 5/28. [name], [name] ([id number]) 5/17; patient presents for follow-up. He missed his last clinic appointment. He has been using Dakin's wet-to-dry dressings. He states he is offloading the right foot wound with the defender boot although he does not have this today. He is still on IV antibiotics. He has no issues or complaints today. 5/24; patient presents for follow-up. He has been using Hydrofera Blue to the wound  bed without issues. He denies signs of infection. At last clinic visit he had a wound culture done that detected high levels of Enterococcus faecalis and low levels of Corynebacterium stratum and Staphylococcus epidermidis. [facility]stone antibiotics were ordered. He has not heard from the company yet. 5/31; patient presents for follow-up. He continues to use Hydrofera Blue to the wound bed. He denies signs of  infection. Keystone antibiotics were ordered at last clinic visit and he states he called the company to pay for it. He states that it is being shipped. We rediscussed the total contact cast and he declines having this placed today. He states he is wearing the defender boot however he never wears it into the office. 04-06-2022 upon evaluation today patient appears to be doing about the same in regard to his wound. This is not significantly smaller. He is not wearing his offloading boot. We discussed that today he tells me that he wears it "all the time as he chuckled and does not have it on during the office visit today. I think it is increasingly obvious that he is just messing around when he says this based on what I am seeing and otherwise I do not know why he would wear it all the time but not to the clinic. Either way my opinion based on what I am seeing currently is simply that the wound does seem to be still having quite a bit of callus buildup around the edges it is also very dry. He does tell me that he has been using the [facility] since he got it and to be honest I think this is good and should hopefully help keep things under control but at the same time I do not see any evidence of active infection. Again the Jodie Echevaria is a topical antibiotic with multiple compounds to help prevent further infection and worsening overall. Objective Constitutional Well-nourished and well-hydrated in no acute distress. Vitals Time Taken: 9:12 AM, Height: 74 in, Weight: 226 lbs, BMI: 29, Temperature: 97.9 F, Pulse: 72 bpm, Respiratory Rate: 18 breaths/min, Blood Pressure: 121/75 mmHg. Respiratory normal breathing without difficulty. Psychiatric this patient is able to make decisions and demonstrates good insight into disease process. Alert and Oriented x 3. pleasant and cooperative. General Notes: Again the kneeUpon inspection patient's wound bed actually showed signs of for sharp debridement I did perform  debridement to clear away significant callus as well as slough and biofilm on the surface of the wound down to good subcutaneous tissue he tolerated that today without complication postdebridement wound bed appears to be doing better. Integumentary (Hair, Skin) Wound #1 status is Open. Original cause of wound was Blister. The date acquired was: 07/15/2020. The wound has been in treatment 21 weeks. The wound is located on the Right,Plantar Foot. The wound measures 0.9cm length x 0.6cm width x 0.3cm depth; 0.424cm^2 area and 0.127cm^3 volume. There is Fat Layer (Subcutaneous Tissue) exposed. There is a medium amount of serosanguineous drainage noted. The wound margin is thickened. There is large (67-100%) red granulation within the wound bed. There is a small (1-33%) amount of necrotic tissue within the wound bed including Adherent Slough. 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 There was a Excisional Skin/Subcutaneous Tissue Debridement with a total area of 2.25 sq cm performed by Nelida Meuse., PA-C. With the following instrument(s): Curette Material removed includes Callus, Subcutaneous Tissue, and Slough. A time out was conducted at 09:41, prior to the start of the procedure. A Minimum amount of bleeding was controlled with Pressure. The procedure was tolerated well. Post Debridement Measurements: 0.9cm length x 0.6cm width x 0.4cm depth; 0.17cm^3 volume. Character of Wound/Ulcer Post Debridement is stable. Severity of Tissue Post Debridement is: Fat layer  exposed. Post procedure Diagnosis Wound #1: Same as Pre-Procedure Plan Follow-up Appointments: Return Appointment in 1 week. Nurse Visit as needed Bathing/ Shower/ Hygiene: May shower; gently cleanse wound with antibacterial soap, rinse and pat dry prior to dressing wounds No tub bath. Anesthetic (Use 'Patient Medications' Section for Anesthetic Order Entry): Lidocaine applied to wound bed Edema Control - Lymphedema / Segmental Compressive Device / Other: Elevate, Exercise Daily and Avoid Standing for Long Periods of Time. Elevate leg(s) parallel to the floor when sitting. DO YOUR BEST to sleep in the bed at night. DO NOT sleep in your recliner. Long hours of sitting in a recliner leads to swelling of the legs and/or potential wounds on your backside. Off-Loading: Foot Defender  - right foot-keep shin covered-keep pressure off of the wound (Patient is not wear boot to clinic each week. Wearing tennis shoe) Additional Orders / Instructions: Follow Nutritious Diet and Increase Protein Intake - monitor blood sugar to maintain normal limits Other: - Follow up post surgery for left foot-[facility] handling Medications-Please add to medication list.: P.O. Antibiotics - apply Keystone cream every other day WOUND #1: - Foot Wound Laterality: Plantar, Right Cleanser: Normal Saline Discharge Instructions: Wash your hands with soap and water. Remove old dressing, discard into plastic bag and place into trash. Cleanse the wound with Normal Saline prior to applying a clean dressing using gauze sponges, not tissues or cotton balls. Do not scrub or use excessive force. Pat dry using gauze sponges, not tissue or cotton balls. Cleanser: Soap and Water Discharge Instructions: Gently cleanse wound with antibacterial soap, rinse and pat dry prior to dressing wounds Topical: keystone antibiotic Primary Dressing: Hydrofera Blue Ready Transfer Foam, 2.5x2.5 (in/in) Discharge Instructions: Apply  Hydrofera Blue Ready to wound bed as directed Secondary Dressing: Gauze Discharge Instructions: As directed: dry, moistened with saline or moistened with Dakins Solution Secured With: Conform 4'' - Conforming Stretch Gauze Bandage 4x75 (in/in) Discharge Instructions: Apply as directed 1. Would recommend currently that we go and continue with the wound care measures as before and the patient is in agreement with the plan. This includes the use of the [facility] dressing which I do believe is going to do well for him. He is using the [facility] topical antibiotics underneath this. 2. Also can recommend that we have the patient continue with the roll gauze to secure in place. 3. He is supposed to be wearing the foot offender offloading boot but to be honest I am not sure he is ever doing this he tells me he wears it all the time and then chuckles and smiles while obviously not wearing it into the clinic today. I am not really sure how to take this but my opinion is I do not think he is really wearing it at all. We will see patient back for reevaluation in 1 week here in the clinic. If anything worsens or changes  patient will contact our office for additional recommendations. Electronic Signature(s) Signed: 04/06/2022 10:20:45 AM By: Lenda Kelp PA-C Entered By: Lenda Kelp on 04/06/2022 10:20:45 [name] ([id number]) -------------------------------------------------------------------------------- SuperBill Details Patient Name: [name] Date of Service: 04/06/2022 Medical Record Number: [id number] Patient Account Number: [id number] Date of Birth/Sex: 07/14/1956 (66 y.o. M) Treating RN: Angelina Pih Primary Care Provider: Manson Allan Other Clinician: Referring Provider: Manson Allan Treating Provider/Extender: Rowan Blase in Treatment: 21 Diagnosis Coding ICD-10 Codes Code Description [id number] Non-pressure chronic ulcer of other part of right foot with fat  layer exposed E11.621 Type 2 diabetes mellitus with foot ulcer E11.40 Type 2 diabetes mellitus with diabetic neuropathy, unspecified R26.89 Other abnormalities of gait and mobility Facility Procedures CPT4 Code: [id number] Description: 11042 - DEB SUBQ TISSUE 20 SQ CM/< Modifier: Quantity: 1 CPT4 Code: Description: ICD-10 Diagnosis Description L97.512 Non-pressure chronic ulcer of other part of right foot with fat layer ex Modifier: posed Quantity: Physician Procedures CPT4 Code: 8413244 Description: 99214 - WC PHYS LEVEL 4 - EST PT Modifier: 25 Quantity: 1 CPT4 Code: Description: ICD-10 Diagnosis Description L97.512 Non-pressure chronic ulcer of other part of right foot with fat layer ex E11.621 Type 2 diabetes mellitus with foot ulcer E11.40 Type 2 diabetes mellitus with diabetic neuropathy, unspecified R26.89 Other  abnormalities of gait and mobility Modifier: posed Quantity: CPT4 Code: [id number] Description: 11042 - WC PHYS SUBQ TISS 20 SQ CM Modifier: Quantity: 1 CPT4 Code: Description: ICD-10 Diagnosis Description L97.512 Non-pressure chronic ulcer of other part of right foot with fat layer ex Modifier: posed Quantity: Electronic Signature(s) Signed: 04/06/2022 10:21:07 AM By: Lenda Kelp PA-C Entered By: Lenda Kelp on 04/06/2022 10:21:07

## 2022-04-13 ENCOUNTER — Encounter (HOSPITAL_BASED_OUTPATIENT_CLINIC_OR_DEPARTMENT_OTHER): Payer: Medicare Other | Admitting: Internal Medicine

## 2022-04-13 DIAGNOSIS — L97512 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ed: Secondary | ICD-10-CM

## 2022-04-13 DIAGNOSIS — R2689 Other abnormalities of gait and mobility: Secondary | ICD-10-CM

## 2022-04-13 DIAGNOSIS — E11621 Type 2 diabetes mellitus with foot ulcer: Secondary | ICD-10-CM

## 2022-04-13 DIAGNOSIS — E1140 Type 2 diabetes mellitus with diabetic neuropathy, unspecified: Secondary | ICD-10-CM

## 2022-04-14 NOTE — Progress Notes (Signed)
CHUN, SELLEN ([id number]) Visit Report for 04/13/2022 Chief Complaint Document Details Patient Name: [name], [name] Date of Service: 04/13/2022 9:00 AM Medical Record Number: [id number] Patient Account Number: [id number] Date of Birth/Sex: [birth year] (66 y.o. M) Treating RN: Huel Coventry Primary Care Provider: Manson Allan Other Clinician: Betha Loa Referring Provider: Manson Allan Treating Provider/Extender: Tilda Franco in Treatment: 22 Information Obtained from: Patient Chief Complaint Right plantar foot wound Electronic Signature(s) Signed: 04/13/2022 11:59:07 AM By: Geralyn Corwin DO Entered By: Geralyn Corwin on 04/13/2022 10:16:32 [name] ([id number]) -------------------------------------------------------------------------------- Debridement Details Patient Name: [name] Date of Service: 04/13/2022 9:00 AM Medical Record Number: [id number] Patient Account Number: [id number] Date of Birth/Sex: 12-27-1955 (66 y.o. M) Treating RN: Huel Coventry Primary Care Provider: Manson Allan Other Clinician: Betha Loa Referring Provider: Manson Allan Treating Provider/Extender: Tilda Franco in Treatment: 22 Debridement Performed for Wound #1 Right,Plantar Foot Assessment: Performed By: Physician Geralyn Corwin, MD Debridement Type: Debridement Severity of Tissue Pre Debridement: Fat layer exposed Level of Consciousness (Pre- Awake and Alert procedure): Pre-procedure Verification/Time Out Yes - 09:29 Taken: Start Time: 09:29 Total Area Debrided (L x W): 1 (cm) x 1 (cm) = 1 (cm) Tissue and other material Viable, Non-Viable, Callus, Slough, Subcutaneous, Slough debrided: Level: Skin/Subcutaneous Tissue Debridement Description: Excisional Instrument: Curette Bleeding: Minimum Hemostasis Achieved: Pressure End Time: 09:35 Response to Treatment: Procedure was tolerated well Level of Consciousness (Post- Awake and  Alert procedure): Post Debridement Measurements of Total Wound Length: (cm) 0.8 Width: (cm) 0.6 Depth: (cm) 0.4 Volume: (cm) 0.151 Character of Wound/Ulcer Post Debridement: Stable Severity of Tissue Post Debridement: Fat layer exposed Post Procedure Diagnosis Same as Pre-procedure Electronic Signature(s) Signed: 04/13/2022 11:59:07 AM By: Geralyn Corwin DO Signed: 04/13/2022 1:49:35 PM By: Betha Loa Signed: 04/14/2022 5:09:42 PM By: Elliot Gurney, BSN, RN, CWS, Kim RN, BSN Entered By: Betha Loa on 04/13/2022 09:36:36 [name] ([id number]) -------------------------------------------------------------------------------- HPI Details Patient Name: [name] Date of Service: 04/13/2022 9:00 AM Medical Record Number: [id number] Patient Account Number: [id number] Date of Birth/Sex: 11-23-1955 (66 y.o. M) Treating RN: Huel Coventry Primary Care Provider: Manson Allan Other Clinician: Betha Loa Referring Provider: Manson Allan Treating Provider/Extender: Tilda Franco in Treatment: 22 History of Present Illness HPI Description: Admission 11/10/2021 [name] is a 66 year old male with a past medical history of uncontrolled type 2 diabetes with last hemoglobin A1c of 8.1 complicated by peripheral neuropathy that presents to the clinic for a 2-year history of nonhealing ulcer to the bottom of his right foot. He states this started out as a blister caused by a work boot. He reports receiving wound care when he resided in [place]. He is reestablishing his wound care in [facility] today. He is currently keeping the area clean and covered. He has insoles designed to help offload the wound bed. He currently denies signs of infection. 1/18; patient presents for follow-up. He has been using Hydrofera Blue to the wound bed. He has no issues or complaints today. He has not received the defender.[name] He denies signs of infection. 2/1; patient presents for  follow-up. He has been using Hydrofera Blue to the wound bed he states he received the defender boot and has been using it however he did not have it on today. He currently denies signs of infection to the right foot. Unfortunately he developed a wound to the left great toe over the past week. He states he received new orthotics and these caused a blister to the left great toe which turned into a  wound. He has not been doing anything to the wound bed. He currently denies systemic signs of infection. 2/8; Patient presents for follow-up. 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 He states he is about 90% recovered. He has some mild weakness to the left side. He reports taking the antibiotics prescribed at last clinic visit. He reports using Hydrofera Blue for dressing changes. He only uses the offloading boot when he is at home. He uses open toed shoes to the right foot. He currently denies signs of infection. 2/15; patient presents for follow-up. He states he is still taking the antibiotics prescribed. He reports using the defender boot to the right foot and open toed shoe to the left foot however he is not wearing either today. He currently denies systemic signs of infection. He stated he cut down a tree last week and was outside doing yard work. 2/22; patient presents for follow-up. 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 He reports feeling well. He reports using the defender boot to the right leg and continues to use Hydrofera Blue with dressing changes. He denies systemic signs of infection. 3/8; patient presents for follow-up. He sees Dr. Alberteen Spindleline tomorrow for follow-up of the left great toe amputation. This was a partial amputation. He reports he is taking Augmentin currently. He reports increased erythema to the left great toe amputation site. He also  reports a decline in his right plantar foot wound. He reports it is draining more. He denies purulent drainage. 3/15; patient presents for follow-up. He did not see Dr. Alberteen Spindleline last week but states he sees him today. He states he has been taking doxycycline in the past week and started gentamicin ointment. He [facility] with dressing changes. He currently denies systemic signs of infection. 3/22; patient presents for follow-up. Patient had partial amputation of the left great toe on 2/15. 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 Alberteen Spindleline. He reports no issues and has no complaints. He is currently taking Keflex and doxycycline prescribed by Dr. Alberteen Spindleline. He continues to use gentamicin ointment and Hydrofera Blue to the right foot wound. He reports using the defender boot when he is at home. 3/29; patient presents for follow-up. He continues to be on antibiotics per podiatry. He has been using Hydrofera Blue and gentamicin ointment to the right plantar foot wound. He has no issues or complaints today. He denies signs of infection. 4/5; patient presents for follow-up. He has been using Dakin's wet-to-dry dressings with improvement to wound healing. He denies signs of infection. She has no issues or complaints today. 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 Use Hydrofera Blue for a prolonged period of time and has been using Dakin's wet- to-dry I think for the last 2 to 3 weeks. He has a Psychologist, forensicdefender boot at home but he does not wear that coming into the clinic because he drives. He tells me he has been compliant with the defender boot short of when he goes out to drive. He lives alone. He also tells me he has had a previous left great toe amputation We are following him for the right foot wound. 4/26; patient presents for follow-up. He had worsening of his left foot wound and had surgical debridement on 4/16 by Dr. Fanny SkatesSutherland. He is  being  followed by podiatry for this issue. He he is on IV Unasyn based on tissue culture by ID.[name] We are following him for the right foot wound. He has been using Dakin's wet-to-dry dressings without issues. He reports continued usage of the defender boot. 5/3; patient presents for follow-up. He has been using Dakin's wet-to-dry dressings to the right foot wound. He has no issues or complaints today. He reports using the Psychologist, forensic. He reports offloading the foot wound when he is at home by sitting in a recliner. He denies signs of infection. He is still getting IV antibiotics For the left foot wound that is being followed by podiatry. He states the end date is later this month on 5/28. 5/17; patient presents for follow-up. He missed his last clinic appointment. He has been using Dakin's wet-to-dry dressings. He states he is offloading the right foot wound with the defender boot although he does not have this today. He is still on IV antibiotics. He has no issues or complaints today. 5/24; patient presents for follow-up. He has been using Hydrofera Blue to the wound bed without issues. He denies signs of infection. At last clinic [facility], [place] ([id number]) visit he had a wound culture done that detected high levels of Enterococcus faecalis and low levels of Corynebacterium stratum and Staphylococcus epidermidis. Keystone antibiotics were ordered. He has not heard from the company yet. 5/31; patient presents for follow-up. He continues to use Hydrofera Blue to the wound bed. He denies signs of infection. Keystone antibiotics were ordered at last clinic visit and he states he called the company to pay for it. He states that it is being shipped. We rediscussed the total contact cast and he declines having this placed today. He states he is wearing the defender boot however he never wears it into the office. 04-06-2022 upon evaluation today patient appears to be doing about the same in regard to his  wound. This is not significantly smaller. He is not wearing his offloading boot. We discussed that today he tells me that he wears it "all the time as he chuckled and does not have it on during the office visit today. I think it is increasingly obvious that he is just messing around when he says this based on what I am seeing and otherwise I do not know why he would wear it all the time but not to the clinic. Either way my opinion based on what I am seeing currently is simply that the wound does seem to be still having quite a bit of callus buildup around the edges it is also very dry. He does tell me that he has been using the [facility] since he got it and to be honest I think this is good and should hopefully help keep things under control but at the same time I do not see any evidence of active infection. Again the Jodie Echevaria is a topical antibiotic with multiple compounds to help prevent further infection and worsening overall. 6/14; patient presents for follow-up. He has been using Keystone antibiotics with [employer]	[place] daily. He states he is wearing his offloading boot. He does not have this in office today. He is going to the beach for the next 5 days. He states he Will not be using using any offloading device During this time. I again offered the total contact cast however he declined. I did ask him to reconsider for next clinic visit. He currently denies signs of infection. Electronic Signature(s) Signed: 04/13/2022 11:59:07 AM By:  Geralyn CorwinHoffman, Derinda Bartus DO Entered By: Geralyn CorwinHoffman, Patience Nuzzo on 04/13/2022 10:18:09 [name] MatinNEELY, Jazper ([id number]) -------------------------------------------------------------------------------- Physical Exam Details Patient Name: [name] MatinEELY, Izaan Date of Service: 04/13/2022 9:00 AM Medical Record Number: [id number] Patient Account Number: [id number] Date of Birth/Sex: [birth year] (66 y.o. M) Treating RN: Huel CoventryWoody, Kim Primary Care Provider: Manson AllanFields, Glenda Other Clinician:  Betha LoaVenable, Angie Referring Provider: Manson AllanFields, Glenda Treating Provider/Extender: Tilda FrancoHoffman, Tvisha Schwoerer Weeks in Treatment: 22 Constitutional . Cardiovascular . Psychiatric . Notes Right foot: To the plantar aspect of the first metatarsal head there is an open wound with granulation tissue, Nonviable tissue and callus. No surrounding signs of infection. Electronic Signature(s) Signed: 04/13/2022 11:59:07 AM By: Geralyn CorwinHoffman, Rosangela Fehrenbach DO Entered By: Geralyn CorwinHoffman, Damica Gravlin on 04/13/2022 10:18:51 [name] MatinNEELY, Fin ([id number]) -------------------------------------------------------------------------------- Physician Orders Details Patient Name: [name] MatinNEELY, Phillippe Date of Service: 04/13/2022 9:00 AM Medical Record Number: [id number] Patient Account Number: [id number] Date of Birth/Sex: [birth year] (66 y.o. M) Treating RN: Huel CoventryWoody, Kim Primary Care Provider: Manson AllanFields, Glenda Other Clinician: Betha LoaVenable, Angie Referring Provider: Manson AllanFields, Glenda Treating Provider/Extender: Tilda FrancoHoffman, Arlie Posch Weeks in Treatment: 1422 Verbal / Phone Orders: No Diagnosis Coding Follow-up Appointments o Return Appointment in 1 week. o Nurse Visit as needed Bathing/ Shower/ Hygiene o May shower; gently cleanse wound with antibacterial soap, rinse and pat dry prior to dressing wounds o No tub bath. Anesthetic (Use 'Patient Medications' Section for Anesthetic Order Entry) o Lidocaine applied to wound bed Edema Control - Lymphedema / Segmental Compressive Device / Other o Elevate, Exercise Daily and Avoid Standing for Long Periods of Time. o Elevate leg(s) parallel to the floor when sitting. o DO YOUR BEST to sleep in the bed at night. DO NOT sleep in your recliner. Long hours of sitting in a recliner leads to swelling of the legs and/or potential wounds on your backside. Off-Loading o Foot Defender o - right foot-keep shin covered-keep pressure off of the wound (Patient is not wear boot to clinic each week. Wearing tennis  shoe) Additional Orders / Instructions o Follow Nutritious Diet and Increase Protein Intake - monitor blood sugar to maintain normal limits o Other: - Follow up post surgery for left foot-[facility] handling Medications-Please add to medication list. o P.O. Antibiotics - apply Keystone cream every other day Wound Treatment Wound #1 - Foot Wound Laterality: Plantar, Right Cleanser: Normal Saline Discharge Instructions: Wash your hands with soap and water. Remove old dressing, discard into plastic bag and place into trash. Cleanse the wound with Normal Saline prior to applying a clean dressing using gauze sponges, not tissues or cotton balls. Do not scrub or use excessive force. Pat dry using gauze sponges, not tissue or cotton balls. Cleanser: Soap and Water Discharge Instructions: Gently cleanse wound with antibacterial soap, rinse and pat dry prior to dressing wounds Topical: keystone antibiotic Primary Dressing: Hydrofera Blue Ready Transfer Foam, 2.5x2.5 (in/in) Discharge Instructions: Apply Hydrofera Blue Ready to wound bed as directed Secondary Dressing: Gauze Discharge Instructions: As directed: dry, moistened with saline or moistened with Dakins Solution Secured With: Conform 4'' - Conforming Stretch Gauze Bandage 4x75 (in/in) Discharge Instructions: Apply as directed Electronic Signature(s) Signed: 04/13/2022 11:59:07 AM By: Geralyn CorwinHoffman, Rudie Rikard DO Entered By: Geralyn CorwinHoffman, Ronzell Laban on 04/13/2022 10:20:59 [name] MatinNEELY, Nakai ([id number]) [name] MatinNEELY, Vineeth ([id number]) -------------------------------------------------------------------------------- Problem List Details Patient Name: [name] MatinNEELY, Oseph Date of Service: 04/13/2022 9:00 AM Medical Record Number: [id number] Patient Account Number: [id number] Date of Birth/Sex: [birth year] (66 y.o. M) Treating RN: Huel CoventryWoody, Kim Primary Care Provider: Manson AllanFields, Glenda Other Clinician: Betha LoaVenable, Angie Referring Provider: Manson AllanFields, Glenda Treating  Provider/Extender: Tilda FrancoHoffman, Katlin Bortner Weeks in  Treatment: 22 Active Problems ICD-10 Encounter Code Description Active Date MDM Diagnosis L97.512 Non-pressure chronic ulcer of other part of right foot with fat layer 11/10/2021 No Yes exposed E11.621 Type 2 diabetes mellitus with foot ulcer 11/10/2021 No Yes E11.40 Type 2 diabetes mellitus with diabetic neuropathy, unspecified 11/10/2021 No Yes R26.89 Other abnormalities of gait and mobility 11/10/2021 No Yes Inactive Problems ICD-10 Code Description Active Date Inactive Date L97.528 Non-pressure chronic ulcer of other part of left foot with other specified 12/01/2021 12/01/2021 severity Resolved Problems Electronic Signature(s) Signed: 04/13/2022 11:59:07 AM By: Geralyn Corwin DO Entered By: Geralyn Corwin on 04/13/2022 10:16:29 [name] ([id number]) -------------------------------------------------------------------------------- Progress Note Details Patient Name: [name] Date of Service: 04/13/2022 9:00 AM Medical Record Number: [id number] Patient Account Number: [id number] Date of Birth/Sex: 1956-09-25 (66 y.o. M) Treating RN: Huel Coventry Primary Care Provider: Manson Allan Other Clinician: Betha Loa Referring Provider: Manson Allan Treating Provider/Extender: Tilda Franco in Treatment: 22 Subjective Chief Complaint Information obtained from Patient Right plantar foot wound History of Present Illness (HPI) Admission 11/10/2021 Mr. Shivan Hodes is a 66 year old male with a past medical history of uncontrolled type 2 diabetes with last hemoglobin A1c of 8.1 complicated by peripheral neuropathy that presents to the clinic for a 2-year history of nonhealing ulcer to the bottom of his right foot. He states this started out as a blister caused by a work boot. He reports receiving wound care when he resided in [place]. He is reestablishing his wound care in [facility] today. He is currently  keeping the area clean and covered. He has insoles designed to help offload the wound bed. He currently denies signs of infection. 1/18; patient presents for follow-up. He has been using Hydrofera Blue to the wound bed. He has no issues or complaints today. He has not received the defender.[name] He denies signs of infection. 2/1; patient presents for follow-up. He has been using Hydrofera Blue to the wound bed he states he received the defender boot and has been using it however he did not have it on today. He currently denies signs of infection to the right foot. Unfortunately he developed a wound to the left great toe over the past week. He states he received new orthotics and these caused a blister to the left great toe which turned into a wound. He has not been doing anything to the wound bed. He currently denies systemic signs of infection. 2/8; Patient presents for follow-up. 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 He states he is about 90% recovered. He has some mild weakness to the left side. He reports taking the antibiotics prescribed at last clinic visit. He reports using Hydrofera Blue for dressing changes. He only uses the offloading boot when he is at home. He uses open toed shoes to the right foot. He currently denies signs of infection. 2/15; patient presents for follow-up. He states he is still taking the antibiotics prescribed. He reports using the defender boot to the right foot and open toed shoe to the left foot however he is not wearing either today. He currently denies systemic signs of infection. He stated he cut down a tree last week and was outside doing yard work. 2/22; patient presents for follow-up. 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 He reports feeling well. He reports using the defender boot to  the right leg and  continues to use Hydrofera Blue with dressing changes. He denies systemic signs of infection. 3/8; patient presents for follow-up. He sees Dr. Alberteen Spindle tomorrow for follow-up of the left great toe amputation. This was a partial amputation. He reports he is taking Augmentin currently. He reports increased erythema to the left great toe amputation site. He also reports a decline in his right plantar foot wound. He reports it is draining more. He denies purulent drainage. 3/15; patient presents for follow-up. He did not see Dr. Alberteen Spindle last week but states he sees him today. He states he has been taking doxycycline in the past week and started gentamicin ointment. He continues to use [facility] with dressing changes. He currently denies systemic signs of infection. 3/22; patient presents for follow-up. Patient had partial amputation of the left great toe on 2/15. 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 Alberteen Spindle. He reports no issues and has no complaints. He is currently taking Keflex and doxycycline prescribed by Dr. Alberteen Spindle. He continues to use gentamicin ointment and Hydrofera Blue to the right foot wound. He reports using the defender boot when he is at home. 3/29; patient presents for follow-up. He continues to be on antibiotics per podiatry. He has been using Hydrofera Blue and gentamicin ointment to the right plantar foot wound. He has no issues or complaints today. He denies signs of infection. 4/5; patient presents for follow-up. He has been using Dakin's wet-to-dry dressings with improvement to wound healing. He denies signs of infection. She has no issues or complaints today. 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 Use Hydrofera Blue for a prolonged period of time and has been using Dakin's wet- to-dry I think for the last 2 to 3 weeks. He has a Psychologist, forensic at home but he does not wear that coming into the clinic because he drives. He tells  me he has been compliant with the defender boot short of when he goes out to drive. He lives alone. He also tells me he has had a previous left great toe amputation We are following him for the right foot wound. 4/26; patient presents for follow-up. He had worsening of his left foot wound and had surgical debridement on 4/16 by Dr. Fanny Skates. He is being followed by podiatry for this issue. He he is on IV Unasyn based on tissue culture by ID.[name] We are following him for the right foot wound. He has been using Dakin's wet-to-dry dressings without issues. He reports continued usage of the defender boot. 5/3; patient presents for follow-up. He has been using Dakin's wet-to-dry dressings to the right foot wound. He has no issues or complaints today. He reports using the Psychologist, forensic. He reports offloading the foot wound when he is at home by sitting in a recliner. He denies signs of infection. He is still getting IV antibiotics For the left foot wound that is being followed by podiatry. He states the end date is later this month on 5/28. DIJUAN, SLEETH ([id number]) 5/17; patient presents for follow-up. He missed his last clinic appointment. He has been using Dakin's wet-to-dry dressings. He states he is offloading the right foot wound with the defender boot although he does not have this today. He is still on IV antibiotics. He has no issues or complaints today. 5/24; patient presents for follow-up. He has been using Hydrofera Blue to the wound bed without issues. He denies signs of infection. At last  clinic visit he had a wound culture done that detected high levels of Enterococcus faecalis and low levels of Corynebacterium stratum and Staphylococcus epidermidis. [facility] antibiotics were ordered. He has not heard from the company yet. 5/31; patient presents for follow-up. He continues to use Hydrofera Blue to the wound bed. He denies signs of infection. Keystone antibiotics were ordered at last clinic  visit and he states he called the company to pay for it. He states that it is being shipped. We rediscussed the total contact cast and he declines having this placed today. He states he is wearing the defender boot however he never wears it into the office. 04-06-2022 upon evaluation today patient appears to be doing about the same in regard to his wound. This is not significantly smaller. He is not wearing his offloading boot. We discussed that today he tells me that he wears it "all the time as he chuckled and does not have it on during the office visit today. I think it is increasingly obvious that he is just messing around when he says this based on what I am seeing and otherwise I do not know why he would wear it all the time but not to the clinic. Either way my opinion based on what I am seeing currently is simply that the wound does seem to be still having quite a bit of callus buildup around the edges it is also very dry. He does tell me that he has been using the [facility] since he got it and to be honest I think this is good and should hopefully help keep things under control but at the same time I do not see any evidence of active infection. Again the Jodie Echevaria is a topical antibiotic with multiple compounds to help prevent further infection and worsening overall. 6/14; patient presents for follow-up. He has been using Keystone antibiotics with [employer]	[place] daily. He states he is wearing his offloading boot. He does not have this in office today. [place] is going to the beach for the next 5 days. He states he Will not be using using any offloading device During this time. I again offered the total contact cast however he declined. I did ask him to reconsider for next clinic visit. He currently denies signs of infection. Objective Constitutional Vitals Time Taken: 9:07 AM, Height: 74 in, Weight: 226 lbs, BMI: 29, Temperature: 97.8 F, Pulse: 71 bpm, Respiratory Rate: 16 breaths/min, Blood  Pressure: 131/76 mmHg. General Notes: Right foot: To the plantar aspect of the first metatarsal head there is an open wound with granulation tissue, Nonviable tissue and callus. No surrounding signs of infection. Integumentary (Hair, Skin) Wound #1 status is Open. Original cause of wound was Blister. The date acquired was: 07/15/2020. The wound has been in treatment 22 weeks. The wound is located on the Right,Plantar Foot. The wound measures 0.8cm length x 0.6cm width x 0.3cm depth; 0.377cm^2 area and 0.113cm^3 volume. There is Fat Layer (Subcutaneous Tissue) exposed. There is a medium amount of serosanguineous drainage noted. The wound margin is thickened. There is large (67-100%) red granulation within the wound bed. There is a small (1-33%) amount of necrotic tissue within the wound bed including Adherent Slough. 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 I debrided nonviable tissue. I recommended continuing Keystone antibiotics and Hydrofera Blue. He was honest and let me know that he will not be using an offloading device over the next 5 days. I did state that his wound may get worse because of this. He expressed understanding. We had a long discussion about the importance of aggressive offloading in wound healing. He states he will seriously consider a total contact cast at next clinic visit. I will see him in 1 week. JENESIS, SUCHY ([id number]) Procedures Wound #1 Pre-procedure diagnosis of Wound #1 is a Diabetic Wound/Ulcer of the Lower Extremity located on the Right,Plantar Foot .Severity of Tissue Pre Debridement is: Fat layer exposed. There was a Excisional Skin/Subcutaneous Tissue  Debridement with a total area of 1 sq cm performed by Geralyn Corwin, MD. With the following instrument(s): Curette to remove Viable and Non-Viable tissue/material. Material removed includes Callus, Subcutaneous Tissue, and Slough. A time out was conducted at 09:29, prior to the start of the procedure. A Minimum amount of bleeding was controlled with Pressure. The procedure was tolerated well. Post Debridement Measurements: 0.8cm length x 0.6cm width x 0.4cm depth; 0.151cm^3 volume. Character of Wound/Ulcer Post Debridement is stable. Severity of Tissue Post Debridement is: Fat layer exposed. Post procedure Diagnosis Wound #1: Same as Pre-Procedure Plan Follow-up Appointments: Return Appointment in 1 week. Nurse Visit as needed Bathing/ Shower/ Hygiene: May shower; gently cleanse wound with antibacterial soap, rinse and pat dry prior to dressing wounds No tub bath. Anesthetic (Use 'Patient Medications' Section for Anesthetic Order Entry): Lidocaine applied to wound bed Edema Control - Lymphedema / Segmental Compressive Device / Other: Elevate, Exercise Daily and Avoid Standing for Long Periods of Time. Elevate leg(s) parallel to the floor when sitting. DO YOUR BEST to sleep in the bed at night. DO NOT sleep in your recliner. Long hours of sitting in a recliner leads to swelling of the legs and/or potential wounds on your backside. Off-Loading: Foot Defender  - right foot-keep shin covered-keep pressure off of the wound (Patient is not wear boot to clinic each week. Wearing tennis shoe) Additional Orders / Instructions: Follow Nutritious Diet and Increase Protein Intake - monitor blood sugar to maintain normal limits Other: - Follow up post surgery for left foot-[facility] handling Medications-Please add to medication list.: P.O. Antibiotics - apply Keystone cream every other day WOUND #1: - Foot Wound Laterality: Plantar, Right Cleanser: Normal Saline Discharge Instructions:  Wash your hands with soap and water. Remove old dressing, discard into plastic bag and place into trash. Cleanse the wound with Normal Saline prior to applying a clean dressing using gauze sponges, not tissues or cotton balls. Do not scrub or use excessive force. Pat dry using gauze sponges, not tissue or cotton balls. Cleanser: Soap and Water Discharge Instructions: Gently cleanse wound with antibacterial soap, rinse and pat dry prior to dressing wounds Topical: keystone antibiotic Primary Dressing: Hydrofera Blue Ready Transfer Foam, 2.5x2.5 (in/in) Discharge Instructions: Apply Hydrofera Blue Ready to wound bed as directed Secondary Dressing: Gauze Discharge Instructions: As directed: dry, moistened with saline or moistened with Dakins Solution Secured With: Conform 4'' - Conforming Stretch Gauze Bandage 4x75 (in/in) Discharge Instructions: Apply as directed 1. In office sharp debridement 2. Keystone antibiotic with Hydrofera Blue 3. Recommended aggressive offloading defender boot 4. Follow-up in 1 week Electronic Signature(s) Signed: 04/13/2022 11:59:07 AM By: Geralyn Corwin DO Entered By: Geralyn Corwin on 04/13/2022 10:20:37 [name] ([id number]) -------------------------------------------------------------------------------- SuperBill Details Patient Name: [name] Date  of Service: 04/13/2022 Medical Record Number: [id number] Patient Account Number: [id number] Date of Birth/Sex: 1956/06/03 (66 y.o. M) Treating RN: Huel Coventry Primary Care Provider: Manson Allan Other Clinician: Betha Loa Referring Provider: Manson Allan Treating Provider/Extender: Tilda Franco in Treatment: 22 Diagnosis Coding ICD-10 Codes Code Description [phone] 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ed E11.621 Type 2 diabetes mellitus with foot ulcer E11.40 Type 2 diabetes mellitus with diabetic neuropathy, unspecified R26.89 Other  abnormalities of gait and mobility Facility Procedures CPT4 Code: 11552080 Description: 11042 - DEB SUBQ TISSUE 20 SQ CM/< Modifier: Quantity: 1 CPT4 Code: Description: ICD-10 Diagnosis Description L97.512 Non-pressure chronic ulcer of other part of right foot with fat layer ex E11.621 Type 2 diabetes mellitus with foot ulcer E11.40 Type 2 diabetes mellitus with diabetic neuropathy, unspecified R26.89 Other  abnormalities of gait and mobility Modifier: posed Quantity: Physician Procedures CPT4 Code: 2233612 Description: 11042 - WC PHYS SUBQ TISS 20 SQ CM Modifier: Quantity: 1 CPT4 Code: Description: ICD-10 Diagnosis Description L97.512 Non-pressure chronic ulcer of other part of right foot with fat layer ex E11.621 Type 2 diabetes mellitus with foot ulcer E11.40 Type 2 diabetes mellitus with diabetic neuropathy, unspecified R26.89 Other  abnormalities of gait and mobility Modifier: posed Quantity: Electronic Signature(s) Signed: 04/13/2022 11:59:07 AM By: Geralyn Corwin DO Entered By: Geralyn Corwin on 04/13/2022 10:20:49

## 2022-04-14 NOTE — Progress Notes (Signed)
HORRACE, HANAK ([id number]) Visit Report for 04/13/2022 Arrival Information Details Patient Name: [name], [name] Date of Service: 04/13/2022 9:00 AM Medical Record Number: [id number] Patient Account Number: [id number] Date of Birth/Sex: [birth year] (65 y.o. M) Treating RN: Cornell Barman Primary Care Tennyson Wacha: [name] Other Clinician: Massie Kluver Referring [name] [name]: [name] Treating Marilena Trevathan/Extender: Yaakov Guthrie in Treatment: 22 Visit Information History Since Last Visit All ordered tests and consults were completed: No Patient Arrived: Ambulatory Added or deleted any medications: No Arrival Time: 09:01 Any new allergies or adverse reactions: No Transfer Assistance: None Had a fall or experienced change in No Patient Requires Transmission-Based No activities of daily living that may affect Precautions: risk of falls: Patient Has Alerts: Yes Hospitalized since last visit: No Patient Alerts: Patient on Blood Pain Present Now: Yes Thinner DIABETIC Plavix Electronic Signature(s) Signed: 04/13/2022 1:49:35 PM By: Massie Kluver Entered By: Massie Kluver on 04/13/2022 09:09:58 [name] ([id number]) -------------------------------------------------------------------------------- Clinic Level of Care Assessment Details Patient Name: [name] Date of Service: 04/13/2022 9:00 AM Medical Record Number: [id number] Patient Account Number: [id number] Date of Birth/Sex: 05-23-1956 (65 y.o. M) Treating RN: Cornell Barman Primary Care Regina Ganci: [name] Other Clinician: Massie Kluver Referring Jasim Harari: [name] Treating Laurine Kuyper/Extender: Yaakov Guthrie in Treatment: 22 Clinic Level of Care Assessment Items TOOL 1 Quantity Score []  - Use when EandM and Procedure is performed on INITIAL visit 0 ASSESSMENTS - Nursing Assessment / Reassessment []  - General Physical Exam (combine w/ comprehensive assessment (listed just below) when  performed on new 0 pt. evals) []  - 0 Comprehensive Assessment (HX, ROS, Risk Assessments, Wounds Hx, etc.) ASSESSMENTS - Wound and Skin Assessment / Reassessment []  - Dermatologic / Skin Assessment (not related to wound area) 0 ASSESSMENTS - Ostomy and/or Continence Assessment and Care []  - Incontinence Assessment and Management 0 []  - 0 Ostomy Care Assessment and Management (repouching, etc.) PROCESS - Coordination of Care []  - Simple Patient / Family Education for ongoing care 0 []  - 0 Complex (extensive) Patient / Family Education for ongoing care []  - 0 Staff obtains Programmer, systems, Records, Test Results / Process Orders []  - 0 Staff telephones HHA, Nursing Homes / Clarify orders / etc []  - 0 Routine Transfer to another Facility (non-emergent condition) []  - 0 Routine Hospital Admission (non-emergent condition) []  - 0 New Admissions / Biomedical engineer / Ordering NPWT, Apligraf, etc. []  - 0 Emergency Hospital Admission (emergent condition) PROCESS - Special Needs []  - Pediatric / Minor Patient Management 0 []  - 0 Isolation Patient Management []  - 0 Hearing / Language / Visual special needs []  - 0 Assessment of Community assistance (transportation, D/C planning, etc.) []  - 0 Additional assistance / Altered mentation []  - 0 Support Surface(s) Assessment (bed, cushion, seat, etc.) INTERVENTIONS - Miscellaneous []  - External ear exam 0 []  - 0 Patient Transfer (multiple staff / Civil Service fast streamer / Similar devices) []  - 0 Simple Staple / Suture removal (25 or less) []  - 0 Complex Staple / Suture removal (26 or more) []  - 0 Hypo/Hyperglycemic Management (do not check if billed separately) []  - 0 Ankle / Brachial Index (ABI) - do not check if billed separately Has the patient been seen at the hospital within the last three years: Yes Total Score: 0 Level Of Care: ____ [name] ([id number]) Electronic Signature(s) Signed: 04/13/2022 1:49:35 PM By: Massie Kluver Entered By: Massie Kluver on 04/13/2022 09:43:18 [name] ([id number]) -------------------------------------------------------------------------------- Encounter Discharge Information Details Patient Name: [name] Date of Service: 04/13/2022 9:00 AM  Medical Record Number: [id number] Patient Account Number: [id number] Date of Birth/Sex: 30-Sep-1956 (66 y.o. M) Treating RN: Cornell Barman Primary Care Welcome Fults: [name] Other Clinician: Massie Kluver Referring Dudley Mages: [name] Treating Kymere Fullington/Extender: Yaakov Guthrie in Treatment: 22 Encounter Discharge Information Items Post Procedure Vitals Discharge Condition: Stable Temperature (F): 97.8 Ambulatory Status: Ambulatory Pulse (bpm): 71 Discharge Destination: Home Respiratory Rate (breaths/min): 16 Transportation: Private Auto Blood Pressure (mmHg): 134/76 Accompanied By: self Schedule Follow-up Appointment: Yes Clinical Summary of Care: Electronic Signature(s) Signed: 04/13/2022 1:49:35 PM By: Massie Kluver Entered By: Massie Kluver on 04/13/2022 09:44:42 [name] ([id number]) -------------------------------------------------------------------------------- Lower Extremity Assessment Details Patient Name: [name] Date of Service: 04/13/2022 9:00 AM Medical Record Number: [id number] Patient Account Number: [id number] Date of Birth/Sex: [birth year] (65 y.o. M) Treating RN: Cornell Barman Primary Care Merisa Julio: [name] Other Clinician: Massie Kluver Referring Marlise Fahr: [name] Treating Naseem Varden/Extender: Yaakov Guthrie in Treatment: 22 Electronic Signature(s) Signed: 04/13/2022 1:49:35 PM By: Massie Kluver Signed: 04/14/2022 5:09:42 PM By: Gretta Cool, BSN, RN, CWS, Kim RN, BSN Entered By: Massie Kluver on 04/13/2022 09:18:05 [name] ([id number]) -------------------------------------------------------------------------------- Multi Wound Chart  Details Patient Name: [name] Date of Service: 04/13/2022 9:00 AM Medical Record Number: [id number] Patient Account Number: [id number] Date of Birth/Sex: 11/23/1955 (65 y.o. M) Treating RN: Cornell Barman Primary Care Pape Parson: [name] Other Clinician: Massie Kluver Referring Jaquelyn Sakamoto: [name] Treating Richardine Peppers/Extender: Yaakov Guthrie in Treatment: 22 Vital Signs Height(in): 74 Pulse(bpm): 71 Weight(lbs): 226 Blood Pressure(mmHg): 131/76 Body Mass Index(BMI): 29 Temperature(F): 97.8 Respiratory Rate(breaths/min): 16 Photos: [N/A:N/A] Wound Location: Right, Plantar Foot N/A N/A Wounding Event: Blister N/A N/A Primary Etiology: Diabetic Wound/Ulcer of the Lower N/A N/A Extremity Comorbid History: Type II Diabetes, Osteoarthritis N/A N/A Date Acquired: 07/15/2020 N/A N/A Weeks of Treatment: 22 N/A N/A Wound Status: Open N/A N/A Wound Recurrence: No N/A N/A Measurements L x W x D (cm) 0.8x0.6x0.3 N/A N/A Area (cm) : 0.377 N/A N/A Volume (cm) : 0.113 N/A N/A % Reduction in Area: 66.40% N/A N/A % Reduction in Volume: 83.20% N/A N/A Classification: Grade 2 N/A N/A Exudate Amount: Medium N/A N/A Exudate Type: Serosanguineous N/A N/A Exudate Color: red, brown N/A N/A Wound Margin: Thickened N/A N/A Granulation Amount: Large (67-100%) N/A N/A Granulation Quality: Red N/A N/A Necrotic Amount: Small (1-33%) N/A N/A Exposed Structures: Fat Layer (Subcutaneous Tissue): N/A N/A Yes Fascia: No Tendon: No Muscle: No Joint: No Bone: No Epithelialization: None N/A N/A Treatment Notes Electronic Signature(s) Signed: 04/13/2022 1:49:35 PM By: Massie Kluver Entered By: Massie Kluver on 04/13/2022 09:18:24 [name] ([id number]) -------------------------------------------------------------------------------- [place] Details Patient Name: [name] Date of Service: 04/13/2022 9:00 AM Medical Record Number:  [id number] Patient Account Number: [id number] Date of Birth/Sex: 1956-01-29 (65 y.o. M) Treating RN: Cornell Barman Primary Care Salimatou Simone: [name] Other Clinician: Massie Kluver Referring Abrar Koone: [name] Treating Brissa Asante/Extender: Yaakov Guthrie in Treatment: 22 Active Inactive Wound/Skin Impairment Nursing Diagnoses: Impaired tissue integrity Knowledge deficit related to smoking impact on wound healing Knowledge deficit related to ulceration/compromised skin integrity Goals: Patient/caregiver will verbalize understanding of skin care regimen Date Initiated: 11/10/2021 Date Inactivated: 12/01/2021 Target Resolution Date: 11/17/2021 Goal Status: Met Ulcer/skin breakdown will have a volume reduction of 30% by week 4 Date Initiated: 11/10/2021 Date Inactivated: 02/02/2022 Target Resolution Date: 12/08/2021 Goal Status: Met Ulcer/skin breakdown will have a volume reduction of 50% by week 8 Date Initiated: 11/10/2021 Target Resolution Date: 01/05/2022 Goal Status: Active Ulcer/skin breakdown will have a volume reduction of 80%  by week 12 Date Initiated: 11/10/2021 Target Resolution Date: 02/02/2022 Goal Status: Active Ulcer/skin breakdown will heal within 14 weeks Date Initiated: 11/10/2021 Target Resolution Date: 02/16/2022 Goal Status: Active Interventions: Assess patient/caregiver ability to obtain necessary supplies Assess patient/caregiver ability to perform ulcer/skin care regimen upon admission and as needed Assess ulceration(s) every visit Notes: Electronic Signature(s) Signed: 04/13/2022 1:49:35 PM By: Massie Kluver Signed: 04/14/2022 5:09:42 PM By: Gretta Cool, BSN, RN, CWS, Kim RN, BSN Entered By: Massie Kluver on 04/13/2022 09:18:10 [name] ([id number]) -------------------------------------------------------------------------------- Pain Assessment Details Patient Name: [name] Date of Service: 04/13/2022 9:00 AM Medical Record Number:  [id number] Patient Account Number: [id number] Date of Birth/Sex: [birth year] (65 y.o. M) Treating RN: Cornell Barman Primary Care Loralyn Rachel: [name] Other Clinician: Massie Kluver Referring Phillipe Clemon: [name] Treating Ravin Denardo/Extender: Yaakov Guthrie in Treatment: 22 Active Problems Location of Pain Severity and Description of Pain Patient Has Paino Yes Site Locations Pain Location: Generalized Pain Duration of the Pain. Constant / Intermittento Constant Rate the pain. Current Pain Level: 5 Character of Pain Describe the Pain: Throbbing Pain Management and Medication Current Pain Management: Rest: Yes Electronic Signature(s) Signed: 04/13/2022 1:49:35 PM By: Massie Kluver Signed: 04/14/2022 5:09:42 PM By: Gretta Cool, BSN, RN, CWS, Kim RN, BSN Entered By: Massie Kluver on 04/13/2022 09:10:35 [name] ([id number]) -------------------------------------------------------------------------------- Patient/Caregiver Education Details Patient Name: [name] Date of Service: 04/13/2022 9:00 AM Medical Record Number: [id number] Patient Account Number: [id number] Date of Birth/Gender: 1955-11-11 (65 y.o. M) Treating RN: Cornell Barman Primary Care Physician: [name] Other Clinician: Massie Kluver Referring Physician: [name] Treating Physician/Extender: Yaakov Guthrie in Treatment: 22 Education Assessment Education Provided To: Patient Education Topics Provided Wound/Skin Impairment: Handouts: Other: continue wound care as directed Methods: Explain/Verbal Responses: State content correctly Electronic Signature(s) Signed: 04/13/2022 1:49:35 PM By: Massie Kluver Entered By: Massie Kluver on 04/13/2022 09:43:50 [name] ([id number]) -------------------------------------------------------------------------------- Wound Assessment Details Patient Name: [name] Date of Service: 04/13/2022 9:00 AM Medical Record Number:  [id number] Patient Account Number: [id number] Date of Birth/Sex: November 21, 1955 (65 y.o. M) Treating RN: Cornell Barman Primary Care Josyah Achor: [name] Other Clinician: Massie Kluver Referring Kalene Cutler: [name] Treating Melida Northington/Extender: Yaakov Guthrie in Treatment: 22 Wound Status Wound Number: 1 Primary Etiology: Diabetic Wound/Ulcer of the Lower Extremity Wound Location: Right, Plantar Foot Wound Status: Open Wounding Event: Blister Comorbid History: Type II Diabetes, Osteoarthritis Date Acquired: 07/15/2020 Weeks Of Treatment: 22 Clustered Wound: No Photos Wound Measurements Length: (cm) 0.8 Width: (cm) 0.6 Depth: (cm) 0.3 Area: (cm) 0.377 Volume: (cm) 0.113 % Reduction in Area: 66.4% % Reduction in Volume: 83.2% Epithelialization: None Wound Description Classification: Grade 2 Wound Margin: Thickened Exudate Amount: Medium Exudate Type: Serosanguineous Exudate Color: red, brown Foul Odor After Cleansing: No Slough/Fibrino Yes Wound Bed Granulation Amount: Large (67-100%) Exposed Structure Granulation Quality: Red Fascia Exposed: No Necrotic Amount: Small (1-33%) Fat Layer (Subcutaneous Tissue) Exposed: Yes Necrotic Quality: Adherent Slough Tendon Exposed: No Muscle Exposed: No Joint Exposed: No Bone Exposed: No Treatment Notes Wound #1 (Foot) Wound Laterality: Plantar, Right Cleanser Normal Saline Discharge Instruction: Wash your hands with soap and water. Remove old dressing, discard into plastic bag and place into trash. Cleanse the wound with Normal Saline prior to applying a clean dressing using gauze sponges, not tissues or cotton balls. Do not scrub or use excessive force. Pat dry using gauze sponges, not tissue or cotton balls. KATHLEEN, LIKINS ([id number]) Soap and Water Discharge Instruction: Gently cleanse wound with antibacterial soap, rinse and pat dry prior to  dressing wounds Peri-Wound Care Topical keystone antibiotic Primary  Dressing Hydrofera Blue Ready Transfer Foam, 2.5x2.5 (in/in) Discharge Instruction: Apply Hydrofera Blue Ready to wound bed as directed Secondary Dressing Gauze Discharge Instruction: As directed: dry, moistened with saline or moistened with Dakins Solution Secured With Conform 4'' - Conforming Stretch Gauze Bandage 4x75 (in/in) Discharge Instruction: Apply as directed Compression Wrap Compression Stockings Add-Ons Electronic Signature(s) Signed: 04/13/2022 1:49:35 PM By: Massie Kluver Signed: 04/14/2022 5:09:42 PM By: Gretta Cool, BSN, RN, CWS, Kim RN, BSN Entered By: Massie Kluver on 04/13/2022 09:16:52 [name] ([id number]) -------------------------------------------------------------------------------- [place] Details Patient Name: [name] Date of Service: 04/13/2022 9:00 AM Medical Record Number: [id number] Patient Account Number: [id number] Date of Birth/Sex: Dec 31, 1955 (65 y.o. M) Treating RN: Cornell Barman Primary Care Leoni Goodness: [name] Other Clinician: Massie Kluver Referring [name] Abila: [name] Treating Charlii Yost/Extender: Yaakov Guthrie in Treatment: 22 Vital Signs Time Taken: 09:07 Temperature (F): 97.8 Height (in): 74 Pulse (bpm): 71 Weight (lbs): 226 Respiratory Rate (breaths/min): 16 Body Mass Index (BMI): 29 Blood Pressure (mmHg): 131/76 Reference Range: 80 - 120 mg / dl Electronic Signature(s) Signed: 04/13/2022 1:49:35 PM By: Massie Kluver Entered By: Massie Kluver on 04/13/2022 09:09:29

## 2022-04-20 ENCOUNTER — Encounter (HOSPITAL_BASED_OUTPATIENT_CLINIC_OR_DEPARTMENT_OTHER): Payer: Medicare Other | Admitting: Internal Medicine

## 2022-04-20 DIAGNOSIS — E11621 Type 2 diabetes mellitus with foot ulcer: Secondary | ICD-10-CM

## 2022-04-20 DIAGNOSIS — L97512 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ed: Secondary | ICD-10-CM

## 2022-04-27 ENCOUNTER — Encounter (HOSPITAL_BASED_OUTPATIENT_CLINIC_OR_DEPARTMENT_OTHER): Payer: Medicare Other | Admitting: Internal Medicine

## 2022-04-27 DIAGNOSIS — E11621 Type 2 diabetes mellitus with foot ulcer: Secondary | ICD-10-CM | POA: Diagnosis not present

## 2022-04-27 DIAGNOSIS — L97512 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ed: Secondary | ICD-10-CM

## 2022-05-04 ENCOUNTER — Encounter: Payer: Medicare Other | Attending: Internal Medicine | Admitting: Internal Medicine

## 2022-05-04 DIAGNOSIS — E1142 Type 2 diabetes mellitus with diabetic polyneuropathy: Secondary | ICD-10-CM | POA: Diagnosis not present

## 2022-05-04 DIAGNOSIS — Z8619 Personal history of other infectious and parasitic diseases: Secondary | ICD-10-CM | POA: Insufficient documentation

## 2022-05-04 DIAGNOSIS — L97512 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ed: Secondary | ICD-10-CM | POA: Diagnosis not present

## 2022-05-04 DIAGNOSIS — E11621 Type 2 diabetes mellitus with foot ulcer: Secondary | ICD-10-CM | POA: Insufficient documentation

## 2022-05-04 DIAGNOSIS — I69354 Hemiplegia and hemiparesis following cerebral infarction affecting left non-dominant side: Secondary | ICD-10-CM | POA: Insufficient documentation

## 2022-05-04 DIAGNOSIS — R2689 Other abnormalities of gait and mobility: Secondary | ICD-10-CM | POA: Diagnosis not present

## 2022-05-04 DIAGNOSIS — Z89412 Acquired absence of left great toe: Secondary | ICD-10-CM | POA: Insufficient documentation

## 2022-05-05 NOTE — Progress Notes (Signed)
ERCELL, RAZON ([id number]) Visit Report for 05/04/2022 Chief Complaint Document Details Patient Name: [name], [name] Date of Service: 05/04/2022 9:00 AM Medical Record Number: [id number] Patient Account Number: [id number] Date of Birth/Sex: February 12, 1956 (66 y.o. M) Treating RN: Angelina Pih Primary Care Provider: Manson Allan Other Clinician: Betha Loa Referring Provider: Manson Allan Treating Provider/Extender: Tilda Franco in Treatment: 25 Information Obtained from: Patient Chief Complaint Right plantar foot wound Electronic Signature(s) Signed: 05/04/2022 9:15:06 AM By: Geralyn Corwin DO Entered By: Geralyn Corwin on 05/04/2022 09:11:01 [name] ([id number]) -------------------------------------------------------------------------------- Debridement Details Patient Name: [name] Date of Service: 05/04/2022 9:00 AM Medical Record Number: [id number] Patient Account Number: [id number] Date of Birth/Sex: 27-Dec-1955 (66 y.o. M) Treating RN: Angelina Pih Primary Care Provider: Manson Allan Other Clinician: Betha Loa Referring Provider: Manson Allan Treating Provider/Extender: Tilda Franco in Treatment: 25 Debridement Performed for Wound #1 Right,Plantar Foot Assessment: Performed By: Physician Geralyn Corwin, MD Debridement Type: Debridement Severity of Tissue Pre Debridement: Fat layer exposed Level of Consciousness (Pre- Awake and Alert procedure): Pre-procedure Verification/Time Out Yes - 08:57 Taken: Start Time: 08:57 Total Area Debrided (L x W): 1 (cm) x 1 (cm) = 1 (cm) Tissue and other material Viable, Non-Viable, Callus, Slough, Subcutaneous, Slough debrided: Level: Skin/Subcutaneous Tissue Debridement Description: Excisional Instrument: Curette Bleeding: Minimum Hemostasis Achieved: Pressure End Time: 09:00 Response to Treatment: Procedure was tolerated well Level of Consciousness (Post- Awake and  Alert procedure): Post Debridement Measurements of Total Wound Length: (cm) 0.6 Width: (cm) 0.7 Depth: (cm) 0.5 Volume: (cm) 0.165 Character of Wound/Ulcer Post Debridement: Stable Severity of Tissue Post Debridement: Fat layer exposed Post Procedure Diagnosis Same as Pre-procedure Electronic Signature(s) Signed: 05/04/2022 9:15:06 AM By: Geralyn Corwin DO Signed: 05/04/2022 4:13:21 PM By: Angelina Pih Signed: 05/05/2022 4:13:59 PM By: Betha Loa Entered By: Betha Loa on 05/04/2022 09:00:40 [name] ([id number]) -------------------------------------------------------------------------------- HPI Details Patient Name: [name] Date of Service: 05/04/2022 9:00 AM Medical Record Number: [id number] Patient Account Number: [id number] Date of Birth/Sex: 02/18/1956 (66 y.o. M) Treating RN: Angelina Pih Primary Care Provider: Manson Allan Other Clinician: Betha Loa Referring Provider: Manson Allan Treating Provider/Extender: Tilda Franco in Treatment: 25 History of Present Illness HPI Description: Admission 11/10/2021 [name] is a 66 year old male with a past medical history of uncontrolled type 2 diabetes with last hemoglobin A1c of 8.1 complicated by peripheral neuropathy that presents to the clinic for a 2-year history of nonhealing ulcer to the bottom of his right foot. He states this started out as a blister caused by a work boot. He reports receiving wound care when he resided in [place]. He is reestablishing his wound care in [facility] today. He is currently keeping the area clean and covered. He has insoles designed to help offload the wound bed. He currently denies signs of infection. 1/18; patient presents for follow-up. He has been using Hydrofera Blue to the wound bed. He has no issues or complaints today. He has not received the defender.[name] He denies signs of infection. 2/1; patient presents for follow-up. He has  been using Hydrofera Blue to the wound bed he states he received the defender boot and has been using it however he did not have it on today. He currently denies signs of infection to the right foot. Unfortunately he developed a wound to the left great toe over the past week. He states he received new orthotics and these caused a blister to the left great toe which turned into a wound. He has not been  doing anything to the wound bed. He currently denies systemic signs of infection. 2/8; Patient presents for follow-up. 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 He states he is about 90% recovered. He has some mild weakness to the left side. He reports taking the antibiotics prescribed at last clinic visit. He reports using Hydrofera Blue for dressing changes. He only uses the offloading boot when he is at home. He uses open toed shoes to the right foot. He currently denies signs of infection. 2/15; patient presents for follow-up. He states he is still taking the antibiotics prescribed. He reports using the defender boot to the right foot and open toed shoe to the left foot however he is not wearing either today. He currently denies systemic signs of infection. He stated he cut down a tree last week and was outside doing yard work. 2/22; patient presents for follow-up. 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 He reports feeling well. He reports using the defender boot to the right leg and continues to use Hydrofera Blue with dressing changes. He denies systemic signs of infection. 3/8; patient presents for follow-up. He sees Dr. Alberteen Spindle tomorrow for follow-up of the left great toe amputation. This was a partial amputation. He reports he is taking Augmentin currently. He reports increased erythema to the left great toe amputation site. He also reports a decline  in his right plantar foot wound. He reports it is draining more. He denies purulent drainage. 3/15; patient presents for follow-up. He did not see Dr. Alberteen Spindle last week but states he sees him today. He states he has been taking doxycycline in the past week and started gentamicin ointment. He continues to use [facility] with dressing changes. He currently denies systemic signs of infection. 3/22; patient presents for follow-up. Patient had partial amputation of the left great toe on 2/15. 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 Alberteen Spindle. He reports no issues and has no complaints. He is currently taking Keflex and doxycycline prescribed by Dr. Alberteen Spindle. He continues to use gentamicin ointment and Hydrofera Blue to the right foot wound. He reports using the defender boot when he is at home. 3/29; patient presents for follow-up. He continues to be on antibiotics per podiatry. He has been using Hydrofera Blue and gentamicin ointment to the right plantar foot wound. He has no issues or complaints today. He denies signs of infection. 4/5; patient presents for follow-up. He has been using Dakin's wet-to-dry dressings with improvement to wound healing. He denies signs of infection. She has no issues or complaints today. 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 Use Hydrofera Blue for a prolonged period of time and has been using Dakin's wet- to-dry I think for the last 2 to 3 weeks. He has a Psychologist, forensic at home but he does not wear that coming into the clinic because he drives. He tells me he has been compliant with the defender boot short of when he goes out to drive. He lives alone. He also tells me he has had a previous left great toe amputation We are following him for the right foot wound. 4/26; patient presents for follow-up. He had worsening of his left foot wound and had surgical debridement on 4/16 by Dr. Fanny Skates. He is being followed by  podiatry for this  issue. He he is on IV Unasyn based on tissue culture by ID.[name] We are following him for the right foot wound. He has been using Dakin's wet-to-dry dressings without issues. He reports continued usage of the defender boot. 5/3; patient presents for follow-up. He has been using Dakin's wet-to-dry dressings to the right foot wound. He has no issues or complaints today. He reports using the Psychologist, forensic. He reports offloading the foot wound when he is at home by sitting in a recliner. He denies signs of infection. He is still getting IV antibiotics For the left foot wound that is being followed by podiatry. He states the end date is later this month on 5/28. 5/17; patient presents for follow-up. He missed his last clinic appointment. He has been using Dakin's wet-to-dry dressings. He states he is offloading the right foot wound with the defender boot although he does not have this today. He is still on IV antibiotics. He has no issues or complaints today. 5/24; patient presents for follow-up. He has been using Hydrofera Blue to the wound bed without issues. He denies signs of infection. At last clinic [facility] [place] ([id number]) visit he had a wound culture done that detected high levels of Enterococcus faecalis and low levels of Corynebacterium stratum and Staphylococcus epidermidis. [facility]stone antibiotics were ordered. He has not heard from the company yet. 5/31; patient presents for follow-up. He continues to use Hydrofera Blue to the wound bed. He denies signs of infection. Keystone antibiotics were ordered at last clinic visit and he states he called the company to pay for it. He states that it is being shipped. We rediscussed the total contact cast and he declines having this placed today. He states he is wearing the defender boot however he never wears it into the office. 04-06-2022 upon evaluation today patient appears to be doing about the same in regard to his wound. This is not  significantly smaller. He is not wearing his offloading boot. We discussed that today he tells me that he wears it "all the time as he chuckled and does not have it on during the office visit today. I think it is increasingly obvious that he is just messing around when he says this based on what I am seeing and otherwise I do not know why he would wear it all the time but not to the clinic. Either way my opinion based on what I am seeing currently is simply that the wound does seem to be still having quite a bit of callus buildup around the edges it is also very dry. He does tell me that he has been using the [facility] since he got it and to be honest I think this is good and should hopefully help keep things under control but at the same time I do not see any evidence of active infection. Again the Jodie Echevaria is a topical antibiotic with multiple compounds to help prevent further infection and worsening overall. 6/14; patient presents for follow-up. He has been using Keystone antibiotics with [employer]	[place] daily. He states he is wearing his offloading boot. He does not have this in office today. He is going to the beach for the next 5 days. He states he Will not be using using any offloading device During this time. I again offered the total contact cast however he declined. I did ask him to reconsider for next clinic visit. He currently denies signs of infection. 6/21; patient presents for follow-up. He went to the beach last week.  He did not use an offloading device. Wound is slightly deeper today. I again offered the total contact cast but he declined. Currently denies signs of infection. He reports using the [place], hydrofera blue and the Psychologist, forensic. 6/28; patient presents for follow-up. Patient declines total contact cast today. He has been using Keystone and [facility] to the wound bed. It is unclear if he is using the Psychologist, forensic. He would like a letter for the post man to delivers  mail to his front door so he does not have to walk down the driveway. 7/5; patient presents for follow-up. He states he is considering the total contact cast however does not want this placed today. He is actually going to the gym after this appointment. He does not want offloading foot wear for this. He has been using Keystone and [facility] to the wound bed. He denies signs of infection. Electronic Signature(s) Signed: 05/04/2022 9:15:06 AM By: Geralyn Corwin DO Entered By: Geralyn Corwin on 05/04/2022 09:11:52 [name] ([id number]) -------------------------------------------------------------------------------- Physical Exam Details Patient Name: [name] Date of Service: 05/04/2022 9:00 AM Medical Record Number: [id number] Patient Account Number: [id number] Date of Birth/Sex: 1956/05/06 (66 y.o. M) Treating RN: Angelina Pih Primary Care Provider: Manson Allan Other Clinician: Betha Loa Referring Provider: Manson Allan Treating Provider/Extender: Tilda Franco in Treatment: 25 Constitutional . Cardiovascular . Psychiatric . Notes Right foot: To the plantar aspect of the first metatarsal there is an open wound with granulation tissue, nonviable tissue and callus. No surrounding signs of infection. Electronic Signature(s) Signed: 05/04/2022 9:15:06 AM By: Geralyn Corwin DO Entered By: Geralyn Corwin on 05/04/2022 09:12:08 [name] ([id number]) -------------------------------------------------------------------------------- Physician Orders Details Patient Name: [name] Date of Service: 05/04/2022 9:00 AM Medical Record Number: [id number] Patient Account Number: [id number] Date of Birth/Sex: 1956/01/03 (66 y.o. M) Treating RN: Angelina Pih Primary Care Provider: Manson Allan Other Clinician: Betha Loa Referring Provider: Manson Allan Treating Provider/Extender: Tilda Franco in Treatment: 49 Verbal /  Phone Orders: No Diagnosis Coding Follow-up Appointments o Return Appointment in 1 week. o Nurse Visit as needed Bathing/ Shower/ Hygiene o May shower; gently cleanse wound with antibacterial soap, rinse and pat dry prior to dressing wounds o No tub bath. Anesthetic (Use 'Patient Medications' Section for Anesthetic Order Entry) o Lidocaine applied to wound bed Edema Control - Lymphedema / Segmental Compressive Device / Other o Elevate, Exercise Daily and Avoid Standing for Long Periods of Time. o Elevate leg(s) parallel to the floor when sitting. o DO YOUR BEST to sleep in the bed at night. DO NOT sleep in your recliner. Long hours of sitting in a recliner leads to swelling of the legs and/or potential wounds on your backside. Off-Loading o Foot Defender o - right foot-keep shin covered-keep pressure off of the wound (Patient is not wear boot to clinic each week. Wearing tennis shoe) Additional Orders / Instructions o Follow Nutritious Diet and Increase Protein Intake - monitor blood sugar to maintain normal limits o Other: - Follow up post surgery for left foot-[facility] handling Medications-Please add to medication list. o P.O. Antibiotics - apply Keystone cream every other day Wound Treatment Wound #1 - Foot Wound Laterality: Plantar, Right Cleanser: Normal Saline Discharge Instructions: Wash your hands with soap and water. Remove old dressing, discard into plastic bag and place into trash. Cleanse the wound with Normal Saline prior to applying a clean dressing using gauze sponges, not tissues or cotton balls. Do not scrub or use excessive force. Dennie Bible  dry using gauze sponges, not tissue or cotton balls. Cleanser: Soap and Water Discharge Instructions: Gently cleanse wound with antibacterial soap, rinse and pat dry prior to dressing wounds Topical: keystone antibiotic Primary Dressing: Hydrofera Blue Ready Transfer Foam, 2.5x2.5 (in/in) Discharge  Instructions: Apply Hydrofera Blue Ready to wound bed as directed Secondary Dressing: Gauze Discharge Instructions: As directed: dry, moistened with saline or moistened with Dakins Solution Secured With: Conform 4'' - Conforming Stretch Gauze Bandage 4x75 (in/in) Discharge Instructions: Apply as directed Electronic Signature(s) Signed: 05/04/2022 9:15:06 AM By: Geralyn Corwin DO Entered By: Geralyn Corwin on 05/04/2022 09:14:27 [name] ([id number]) [name] ([id number]) -------------------------------------------------------------------------------- Problem List Details Patient Name: [name] Date of Service: 05/04/2022 9:00 AM Medical Record Number: [id number] Patient Account Number: [id number] Date of Birth/Sex: 30-Sep-1956 (66 y.o. M) Treating RN: Angelina Pih Primary Care Provider: Manson Allan Other Clinician: Betha Loa Referring Provider: Manson Allan Treating Provider/Extender: Tilda Franco in Treatment: 25 Active Problems ICD-10 Encounter Code Description Active Date MDM Diagnosis L97.512 Non-pressure chronic ulcer of other part of right foot with fat layer 11/10/2021 No Yes exposed E11.621 Type 2 diabetes mellitus with foot ulcer 11/10/2021 No Yes E11.40 Type 2 diabetes mellitus with diabetic neuropathy, unspecified 11/10/2021 No Yes R26.89 Other abnormalities of gait and mobility 11/10/2021 No Yes Inactive Problems ICD-10 Code Description Active Date Inactive Date L97.528 Non-pressure chronic ulcer of other part of left foot with other specified 12/01/2021 12/01/2021 severity Resolved Problems Electronic Signature(s) Signed: 05/04/2022 9:15:06 AM By: Geralyn Corwin DO Entered By: Geralyn Corwin on 05/04/2022 09:10:57 [name] ([id number]) -------------------------------------------------------------------------------- Progress Note Details Patient Name: [name] Date of Service: 05/04/2022 9:00 AM Medical Record Number:  [id number] Patient Account Number: [id number] Date of Birth/Sex: 1956/09/22 (66 y.o. M) Treating RN: Angelina Pih Primary Care Provider: Manson Allan Other Clinician: Betha Loa Referring Provider: Manson Allan Treating Provider/Extender: Tilda Franco in Treatment: 25 Subjective Chief Complaint Information obtained from Patient Right plantar foot wound History of Present Illness (HPI) Admission 11/10/2021 Mr. Xyon Lukasik is a 66 year old male with a past medical history of uncontrolled type 2 diabetes with last hemoglobin A1c of 8.1 complicated by peripheral neuropathy that presents to the clinic for a 2-year history of nonhealing ulcer to the bottom of his right foot. He states this started out as a blister caused by a work boot. He reports receiving wound care when he resided in [place]. He is reestablishing his wound care in [facility] today. He is currently keeping the area clean and covered. He has insoles designed to help offload the wound bed. He currently denies signs of infection. 1/18; patient presents for follow-up. He has been using Hydrofera Blue to the wound bed. He has no issues or complaints today. He has not received the defender.[name] He denies signs of infection. 2/1; patient presents for follow-up. He has been using Hydrofera Blue to the wound bed he states he received the defender boot and has been using it however he did not have it on today. He currently denies signs of infection to the right foot. Unfortunately he developed a wound to the left great toe over the past week. He states he received new orthotics and these caused a blister to the left great toe which turned into a wound. He has not been doing anything to the wound bed. He currently denies systemic signs of infection. 2/8; Patient presents for follow-up. Since last seen in the clinic he experienced a CVA and was hospitalized for 2 days. He had  an acute small vessel infarct of  the right thalamic capsular region. He states he is about 90% recovered. He has some mild weakness to the left side. He reports taking the antibiotics prescribed at last clinic visit. He reports using Hydrofera Blue for dressing changes. He only uses the offloading boot when he is at home. He uses open toed shoes to the right foot. He currently denies signs of infection. 2/15; patient presents for follow-up. He states he is still taking the antibiotics prescribed. He reports using the defender boot to the right foot and open toed shoe to the left foot however he is not wearing either today. He currently denies systemic signs of infection. He stated he cut down a tree last week and was outside doing yard work. 2/22; patient presents for follow-up. 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 He reports feeling well. He reports using the defender boot to the right leg and continues to use Hydrofera Blue with dressing changes. He denies systemic signs of infection. 3/8; patient presents for follow-up. He sees Dr. Alberteen Spindleline tomorrow for follow-up of the left great toe amputation. This was a partial amputation. He reports he is taking Augmentin currently. He reports increased erythema to the left great toe amputation site. He also reports a decline in his right plantar foot wound. He reports it is draining more. He denies purulent drainage. 3/15; patient presents for follow-up. He did not see Dr. Alberteen Spindleline last week but states he sees him today. He states he has been taking doxycycline in the past week and started gentamicin ointment. [facility] dressing changes. He currently denies systemic signs of infection. 3/22; patient presents for follow-up. Patient had partial amputation of the left great toe on 2/15. 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 Alberteen Spindleline. He reports no issues and has no complaints. He is currently taking Keflex  and doxycycline prescribed by Dr. Alberteen Spindleline. He continues to use gentamicin ointment and Hydrofera Blue to the right foot wound. He reports using the defender boot when he is at home. 3/29; patient presents for follow-up. He continues to be on antibiotics per podiatry. He has been using Hydrofera Blue and gentamicin ointment to the right plantar foot wound. He has no issues or complaints today. He denies signs of infection. 4/5; patient presents for follow-up. He has been using Dakin's wet-to-dry dressings with improvement to wound healing. He denies signs of infection. She has no issues or complaints today. 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 Use Hydrofera Blue for a prolonged period of time and has been using Dakin's wet- to-dry I think for the last 2 to 3 weeks. He has a Psychologist, forensicdefender boot at home but he does not wear that coming into the clinic because he drives. He tells me he has been compliant with the defender boot short of when he goes out to drive. He lives alone. He also tells me he has had a previous left great toe amputation We are following him for the right foot wound. 4/26; patient presents for follow-up. He had worsening of his left foot wound and had surgical debridement on 4/16 by Dr. Fanny SkatesSutherland. He is being followed by podiatry for this issue. He he is on IV Unasyn based on tissue culture by ID.[name]. We are following him for the right foot wound. He has been using Dakin's wet-to-dry dressings without issues. He reports continued usage of  the Psychologist, forensic. 5/3; patient presents for follow-up. He has been using Dakin's wet-to-dry dressings to the right foot wound. He has no issues or complaints today. He reports using the Psychologist, forensic. He reports offloading the foot wound when he is at home by sitting in a recliner. He denies signs of infection. He is still getting IV antibiotics For the left foot wound that is being followed by podiatry. He states the end date is  later this month on 5/28. HAAKON, TITSWORTH ([id number]) 5/17; patient presents for follow-up. He missed his last clinic appointment. He has been using Dakin's wet-to-dry dressings. He states he is offloading the right foot wound with the defender boot although he does not have this today. He is still on IV antibiotics. He has no issues or complaints today. 5/24; patient presents for follow-up. He has been using Hydrofera Blue to the wound bed without issues. He denies signs of infection. At last clinic visit he had a wound culture done that detected high levels of Enterococcus faecalis and low levels of Corynebacterium stratum and Staphylococcus epidermidis. Keystone antibiotics were ordered. He has not heard from the company yet. 5/31; patient presents for follow-up. He continues to use Hydrofera Blue to the wound bed. He denies signs of infection. Keystone antibiotics were ordered at last clinic visit and he states he called the company to pay for it. He states that it is being shipped. We rediscussed the total contact cast and he declines having this placed today. He states he is wearing the defender boot however he never wears it into the office. 04-06-2022 upon evaluation today patient appears to be doing about the same in regard to his wound. This is not significantly smaller. He is not wearing his offloading boot. We discussed that today he tells me that he wears it "all the time as he chuckled and does not have it on during the office visit today. I think it is increasingly obvious that he is just messing around when he says this based on what I am seeing and otherwise I do not know why he would wear it all the time but not to the clinic. Either way my opinion based on what I am seeing currently is simply that the wound does seem to be still having quite a bit of callus buildup around the edges it is also very dry. He does tell me that he has been using the [facility] since he got it and to be  honest I think this is good and should hopefully help keep things under control but at the same time I do not see any evidence of active infection. Again the Jodie Echevaria is a topical antibiotic with multiple compounds to help prevent further infection and worsening overall. 6/14; patient presents for follow-up. He has been using Keystone antibiotics with [employer]	[place] daily. He states he is wearing his offloading boot. He does not have this in office today. He is going to the beach for the next 5 days. He states he Will not be using using any offloading device During this time. I again offered the total contact cast however he declined. I did ask him to reconsider for next clinic visit. He currently denies signs of infection. 6/21; patient presents for follow-up. He went to the beach last week. He did not use an offloading device. Wound is slightly deeper today. I again offered the total contact cast but he declined. Currently denies signs of infection. He reports using the [place], hydrofera blue and  the Psychologist, forensic. 6/28; patient presents for follow-up. Patient declines total contact cast today. He has been using Keystone and [facility] to the wound bed. It is unclear if he is using the Psychologist, forensic. He would like a letter for the post man to delivers mail to his front door so he does not have to walk down the driveway. 7/5; patient presents for follow-up. He states he is considering the total contact cast however does not want this placed today. He is actually going to the gym after this appointment. He does not want offloading foot wear for this. He has been using Keystone and [facility] to the wound bed. He denies signs of infection. Objective Constitutional Vitals Time Taken: 8:43 AM, Height: 74 in, Weight: 226 lbs, BMI: 29, Temperature: 97.7 F, Pulse: 62 bpm, Respiratory Rate: 18 breaths/min, Blood Pressure: 115/69 mmHg. General Notes: Right foot: To the plantar aspect of the  first metatarsal there is an open wound with granulation tissue, nonviable tissue and callus. No surrounding signs of infection. Integumentary (Hair, Skin) Wound #1 status is Open. Original cause of wound was Blister. The date acquired was: 07/15/2020. The wound has been in treatment 25 weeks. The wound is located on the Right,Plantar Foot. The wound measures 0.6cm length x 0.7cm width x 0.4cm depth; 0.33cm^2 area and 0.132cm^3 volume. There is Fat Layer (Subcutaneous Tissue) exposed. There is a medium amount of serosanguineous drainage noted. The wound margin is thickened. There is large (67-100%) red granulation within the wound bed. There is a small (1-33%) amount of necrotic tissue within the wound bed including Adherent Slough. 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 No signs of surrounding infection. I debrided nonviable tissue. Unfortunately patient does not aggressively offload this area. He understands that the wound will not heal without pressure relief. He is at least considering the cast however declines Having this placed today.Continue Keystone antibiotic with Hydrofera Blue. Follow-up in 1 week Procedures Wound #1 Pre-procedure diagnosis of Wound #1 is a Diabetic Wound/Ulcer of the Lower Extremity located on the Right,Plantar Foot .Severity of Tissue Pre Debridement is: Fat layer exposed. There was a Excisional Skin/Subcutaneous Tissue Debridement with a total area of 1 sq cm performed by Geralyn Corwin, MD. With the following instrument(s): Curette to remove Viable and Non-Viable tissue/material. Material removed includes Callus, Subcutaneous Tissue, and Slough. A time out was conducted at 08:57, prior to the  start of the procedure. A Minimum amount of bleeding was controlled with Pressure. The procedure was tolerated well. Post Debridement Measurements: 0.6cm length x 0.7cm width x 0.5cm depth; 0.165cm^3 volume. Character of Wound/Ulcer Post Debridement is stable. Severity of Tissue Post Debridement is: Fat layer exposed. Post procedure Diagnosis Wound #1: Same as Pre-Procedure Plan Follow-up Appointments: Return Appointment in 1 week. Nurse Visit as needed Bathing/ Shower/ Hygiene: May shower; gently cleanse wound with antibacterial soap, rinse and pat dry prior to dressing wounds No tub bath. Anesthetic (Use 'Patient Medications' Section for Anesthetic Order Entry): Lidocaine applied to wound bed Edema Control - Lymphedema / Segmental Compressive Device / Other: Elevate, Exercise Daily and Avoid Standing for Long Periods of Time. Elevate leg(s) parallel to the floor when sitting. DO YOUR BEST to sleep in the bed at night. DO NOT sleep in your recliner. Long  hours of sitting in a recliner leads to swelling of the legs and/or potential wounds on your backside. Off-Loading: Foot Defender  - right foot-keep shin covered-keep pressure off of the wound (Patient is not wear boot to clinic each week. Wearing tennis shoe) Additional Orders / Instructions: Follow Nutritious Diet and Increase Protein Intake - monitor blood sugar to maintain normal limits Other: - Follow up post surgery for left foot-[facility] handling Medications-Please add to medication list.: P.O. Antibiotics - apply Keystone cream every other day WOUND #1: - Foot Wound Laterality: Plantar, Right Cleanser: Normal Saline Discharge Instructions: Wash your hands with soap and water. Remove old dressing, discard into plastic bag and place into trash. Cleanse the wound with Normal Saline prior to applying a clean dressing using gauze sponges, not tissues or cotton balls. Do not scrub or use excessive force. Pat dry using  gauze sponges, not tissue or cotton balls. Cleanser: Soap and Water Discharge Instructions: Gently cleanse wound with antibacterial soap, rinse and pat dry prior to dressing wounds Topical: keystone antibiotic Primary Dressing: Hydrofera Blue Ready Transfer Foam, 2.5x2.5 (in/in) Discharge Instructions: Apply Hydrofera Blue Ready to wound bed as directed Secondary Dressing: Gauze Discharge Instructions: As directed: dry, moistened with saline or moistened with Dakins Solution Secured With: Conform 4'' - Conforming Stretch Gauze Bandage 4x75 (in/in) Discharge Instructions: Apply as directed 1. In office sharp debridement 2. Keystone antibiotic and Hydrofera Blue 3. Aggressive offloading defender boot 4. Follow-up in 1 week Electronic Signature(s) DAANISH, COPES ([id number]) Signed: 05/04/2022 9:15:06 AM By: Geralyn Corwin DO Entered By: Geralyn Corwin on 05/04/2022 09:14:02 [name] ([id number]) -------------------------------------------------------------------------------- SuperBill Details Patient Name: [name] Date of Service: 05/04/2022 Medical Record Number: [id number] Patient Account Number: [id number] Date of Birth/Sex: 09-Feb-1956 (66 y.o. M) Treating RN: Angelina Pih Primary Care Provider: Manson Allan Other Clinician: Betha Loa Referring Provider: Manson Allan Treating Provider/Extender: Tilda Franco in Treatment: 25 Diagnosis Coding ICD-10 Codes Code Description [phone] 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ed E11.621 Type 2 diabetes mellitus with foot ulcer E11.40 Type 2 diabetes mellitus with diabetic neuropathy, unspecified R26.89 Other abnormalities of gait and mobility Facility Procedures CPT4 Code: 13244010 Description: 11042 - DEB SUBQ TISSUE 20 SQ CM/< Modifier: Quantity: 1 CPT4 Code: Description: ICD-10 Diagnosis Description L97.512 Non-pressure chronic ulcer of other part of right foot  with fat layer ex E11.621 Type 2 diabetes mellitus with foot ulcer Modifier: posed Quantity: Physician Procedures CPT4 Code: 2725366 Description: 11042 - WC PHYS SUBQ TISS 20 SQ CM Modifier: Quantity: 1 CPT4 Code: Description: ICD-10 Diagnosis Description L97.512 Non-pressure chronic ulcer of other part of right foot with fat layer ex E11.621 Type 2 diabetes mellitus with foot ulcer Modifier: posed Quantity: Electronic Signature(s) Signed: 05/04/2022 9:15:06 AM By: Geralyn Corwin DO Entered By: Geralyn Corwin on 05/04/2022 09:14:13

## 2022-05-05 NOTE — Progress Notes (Signed)
JEROL, RUFENER ([id number]) Visit Report for 05/04/2022 Arrival Information Details Patient Name: [name], HAZEL Date of Service: 05/04/2022 9:00 AM Medical Record Number: [id number] Patient Account Number: [id number] Date of Birth/Sex: February 20, 1956 (65 y.o. M) Treating RN: Levora Dredge Primary Care : [name] Other Clinician: Massie Kluver Referring : [name] Treating /Extender: Yaakov Guthrie in Treatment: 25 Visit Information History Since Last Visit All ordered tests and consults were completed: No Patient Arrived: Ambulatory Added or deleted any medications: No Arrival Time: 08:38 Any new allergies or adverse reactions: No Transfer Assistance: None Had a fall or experienced change in No Patient Requires Transmission-Based No activities of daily living that may affect Precautions: risk of falls: Patient Has Alerts: Yes Hospitalized since last visit: No Patient Alerts: Patient on Blood Pain Present Now: Yes Thinner DIABETIC Plavix Electronic Signature(s) Signed: 05/05/2022 4:13:59 PM By: Massie Kluver Entered By: Massie Kluver on 05/04/2022 08:42:47 [name] ([id number]) -------------------------------------------------------------------------------- Clinic Level of Care Assessment Details Patient Name: [name] Date of Service: 05/04/2022 9:00 AM Medical Record Number: [id number] Patient Account Number: [id number] Date of Birth/Sex: [birth year] (65 y.o. M) Treating RN: Levora Dredge Primary Care : [name] Other Clinician: Massie Kluver Referring : [name] Treating /Extender: Yaakov Guthrie in Treatment: 25 Clinic Level of Care Assessment Items TOOL 1 Quantity Score [] - Use when EandM and Procedure is performed on INITIAL visit 0 ASSESSMENTS - Nursing Assessment / Reassessment [] - General Physical Exam (combine w/ comprehensive assessment (listed just below) when  performed on new 0 pt. evals) [] - 0 Comprehensive Assessment (HX, ROS, Risk Assessments, Wounds Hx, etc.) ASSESSMENTS - Wound and Skin Assessment / Reassessment [] - Dermatologic / Skin Assessment (not related to wound area) 0 ASSESSMENTS - Ostomy and/or Continence Assessment and Care [] - Incontinence Assessment and Management 0 [] - 0 Ostomy Care Assessment and Management (repouching, etc.) PROCESS - Coordination of Care [] - Simple Patient / Family Education for ongoing care 0 [] - 0 Complex (extensive) Patient / Family Education for ongoing care [] - 0 Staff obtains Consents, Records, Test Results / Process Orders [] - 0 Staff telephones HHA, Nursing Homes / Clarify orders / etc [] - 0 Routine Transfer to another Facility (non-emergent condition) [] - 0 Routine Hospital Admission (non-emergent condition) [] - 0 New Admissions / Biomedical engineer / Ordering NPWT, Apligraf, etc. [] - 0 Emergency Hospital Admission (emergent condition) PROCESS - Special Needs [] - Pediatric / Minor Patient Management 0 [] - 0 Isolation Patient Management [] - 0 Hearing / Language / Visual special needs [] - 0 Assessment of Community assistance (transportation, D/C planning, etc.) [] - 0 Additional assistance / Altered mentation [] - 0 Support Surface(s) Assessment (bed, cushion, seat, etc.) INTERVENTIONS - Miscellaneous [] - External ear exam 0 [] - 0 Patient Transfer (multiple staff / Civil Service fast streamer / Similar devices) [] - 0 Simple Staple / Suture removal (25 or less) [] - 0 Complex Staple / Suture removal (26 or more) [] - 0 Hypo/Hyperglycemic Management (do not check if billed separately) [] - 0 Ankle / Brachial Index (ABI) - do not check if billed separately Has the patient been seen at the hospital within the last three years: Yes Total Score: 0 Level Of Care: ____ [name] ([id number]) Electronic Signature(s) Signed: 05/05/2022 4:13:59 PM By: Massie Kluver Entered By: Massie Kluver on 05/04/2022 09:01:03 [name] ([id number]) -------------------------------------------------------------------------------- Encounter Discharge Information Details Patient Name: [name] Date of Service: 05/04/2022 9:00 AM  Medical Record Number: [id number] Patient Account Number: [id number] Date of Birth/Sex: 1955/11/20 (66 y.o. M) Treating RN: Levora Dredge Primary Care : [name] Other Clinician: Massie Kluver Referring : [name] Treating /Extender: Yaakov Guthrie in Treatment: 25 Encounter Discharge Information Items Post Procedure Vitals Discharge Condition: Stable Temperature (F): 97.7 Ambulatory Status: Ambulatory Pulse (bpm): 62 Discharge Destination: Home Respiratory Rate (breaths/min): 18 Transportation: Private Auto Blood Pressure (mmHg): 115/69 Accompanied By: self Schedule Follow-up Appointment: No Clinical Summary of Care: Electronic Signature(s) Signed: 05/05/2022 4:13:59 PM By: Massie Kluver Entered By: Massie Kluver on 05/04/2022 09:07:49 [name] ([id number]) -------------------------------------------------------------------------------- Lower Extremity Assessment Details Patient Name: [name] Date of Service: 05/04/2022 9:00 AM Medical Record Number: [id number] Patient Account Number: [id number] Date of Birth/Sex: 29-Dec-1955 (65 y.o. M) Treating RN: Levora Dredge Primary Care : [name] Other Clinician: Massie Kluver Referring : [name] Treating /Extender: Yaakov Guthrie in Treatment: 25 Electronic Signature(s) Signed: 05/04/2022 4:13:21 PM By: Levora Dredge Signed: 05/05/2022 4:13:59 PM By: Massie Kluver Entered By: Massie Kluver on 05/04/2022 08:52:55 [name] ([id number]) -------------------------------------------------------------------------------- Multi Wound Chart Details Patient Name:  [name] Date of Service: 05/04/2022 9:00 AM Medical Record Number: [id number] Patient Account Number: [id number] Date of Birth/Sex: February 26, 1956 (65 y.o. M) Treating RN: Levora Dredge Primary Care : [name] Other Clinician: Massie Kluver Referring : [name] Treating /Extender: Yaakov Guthrie in Treatment: 25 Vital Signs Height(in): 74 Pulse(bpm): 62 Weight(lbs): 226 Blood Pressure(mmHg): 115/69 Body Mass Index(BMI): 29 Temperature(F): 97.7 Respiratory Rate(breaths/min): 18 Photos: [N/A:N/A] Wound Location: Right, Plantar Foot N/A N/A Wounding Event: Blister N/A N/A Primary Etiology: Diabetic Wound/Ulcer of the Lower N/A N/A Extremity Comorbid History: Type II Diabetes, Osteoarthritis N/A N/A Date Acquired: 07/15/2020 N/A N/A Weeks of Treatment: 25 N/A N/A Wound Status: Open N/A N/A Wound Recurrence: No N/A N/A Measurements L x W x D (cm) 0.6x0.7x0.4 N/A N/A Area (cm) : 0.33 N/A N/A Volume (cm) : 0.132 N/A N/A % Reduction in Area: 70.60% N/A N/A % Reduction in Volume: 80.40% N/A N/A Classification: Grade 2 N/A N/A Exudate Amount: Medium N/A N/A Exudate Type: Serosanguineous N/A N/A Exudate Color: red, brown N/A N/A Wound Margin: Thickened N/A N/A Granulation Amount: Large (67-100%) N/A N/A Granulation Quality: Red N/A N/A Necrotic Amount: Small (1-33%) N/A N/A Exposed Structures: Fat Layer (Subcutaneous Tissue): N/A N/A Yes Fascia: No Tendon: No Muscle: No Joint: No Bone: No Epithelialization: None N/A N/A Treatment Notes Electronic Signature(s) Signed: 05/05/2022 4:13:59 PM By: Massie Kluver Entered By: Massie Kluver on 05/04/2022 08:53:09 [name] ([id number]) -------------------------------------------------------------------------------- [place] Details Patient Name: [name] Date of Service: 05/04/2022 9:00 AM Medical Record Number: [id number] Patient Account Number:  [id number] Date of Birth/Sex: Oct 02, 1956 (65 y.o. M) Treating RN: Levora Dredge Primary Care : [name] Other Clinician: Massie Kluver Referring : [name] Treating /Extender: Yaakov Guthrie in Treatment: 25 Active Inactive Wound/Skin Impairment Nursing Diagnoses: Impaired tissue integrity Knowledge deficit related to smoking impact on wound healing Knowledge deficit related to ulceration/compromised skin integrity Goals: Patient/caregiver will verbalize understanding of skin care regimen Date Initiated: 11/10/2021 Date Inactivated: 12/01/2021 Target Resolution Date: 11/17/2021 Goal Status: Met Ulcer/skin breakdown will have a volume reduction of 30% by week 4 Date Initiated: 11/10/2021 Date Inactivated: 02/02/2022 Target Resolution Date: 12/08/2021 Goal Status: Met Ulcer/skin breakdown will have a volume reduction of 50% by week 8 Date Initiated: 11/10/2021 Target Resolution Date: 01/05/2022 Goal Status: Active Ulcer/skin breakdown will have a volume reduction of 80% by week 12 Date Initiated:  11/10/2021 Target Resolution Date: 02/02/2022 Goal Status: Active Ulcer/skin breakdown will heal within 14 weeks Date Initiated: 11/10/2021 Target Resolution Date: 02/16/2022 Goal Status: Active Interventions: Assess patient/caregiver ability to obtain necessary supplies Assess patient/caregiver ability to perform ulcer/skin care regimen upon admission and as needed Assess ulceration(s) every visit Notes: Electronic Signature(s) Signed: 05/04/2022 4:13:21 PM By: Levora Dredge Signed: 05/05/2022 4:13:59 PM By: Massie Kluver Entered By: Massie Kluver on 05/04/2022 08:53:01 [name] ([id number]) -------------------------------------------------------------------------------- Pain Assessment Details Patient Name: [name] Date of Service: 05/04/2022 9:00 AM Medical Record Number: [id number] Patient Account Number: [id number] Date of  Birth/Sex: 1956-10-11 (65 y.o. M) Treating RN: Levora Dredge Primary Care Ioannis Schuh: [name] Other Clinician: Massie Kluver Referring Jansen Sciuto: [name] Treating Leslyn Monda/Extender: Yaakov Guthrie in Treatment: 25 Active Problems Location of Pain Severity and Description of Pain Patient Has Paino Yes Site Locations Pain Location: Generalized Pain With Dressing Change: No Duration of the Pain. Constant / Intermittento Constant Rate the pain. Current Pain Level: 5 Character of Pain Describe the Pain: Throbbing Pain Management and Medication Current Pain Management: Medication: No Rest: Yes Electronic Signature(s) Signed: 05/04/2022 4:13:21 PM By: Levora Dredge Signed: 05/05/2022 4:13:59 PM By: Massie Kluver Entered By: Massie Kluver on 05/04/2022 08:43:27 [name] ([id number]) -------------------------------------------------------------------------------- Patient/Caregiver Education Details Patient Name: [name] Date of Service: 05/04/2022 9:00 AM Medical Record Number: [id number] Patient Account Number: [id number] Date of Birth/Gender: 1956-06-16 (65 y.o. M) Treating RN: Levora Dredge Primary Care Physician: [name] Other Clinician: Massie Kluver Referring Physician: [name] Treating Physician/Extender: Yaakov Guthrie in Treatment: 25 Education Assessment Education Provided To: Patient Education Topics Provided Wound/Skin Impairment: Handouts: Other: continue wound care as directed Electronic Signature(s) Signed: 05/05/2022 4:13:59 PM By: Massie Kluver Entered By: Massie Kluver on 05/04/2022 09:01:28 [name] ([id number]) -------------------------------------------------------------------------------- Wound Assessment Details Patient Name: [name] Date of Service: 05/04/2022 9:00 AM Medical Record Number: [id number] Patient Account Number: [id number] Date of Birth/Sex: 05-Sep-1956 (65 y.o.  M) Treating RN: Levora Dredge Primary Care Alany Borman: [name] Other Clinician: Massie Kluver Referring Rosia Syme: [name] Treating Tomie Elko/Extender: Yaakov Guthrie in Treatment: 25 Wound Status Wound Number: 1 Primary Etiology: Diabetic Wound/Ulcer of the Lower Extremity Wound Location: Right, Plantar Foot Wound Status: Open Wounding Event: Blister Comorbid History: Type II Diabetes, Osteoarthritis Date Acquired: 07/15/2020 Weeks Of Treatment: 25 Clustered Wound: No Photos Wound Measurements Length: (cm) 0.6 Width: (cm) 0.7 Depth: (cm) 0.4 Area: (cm) 0.33 Volume: (cm) 0.132 % Reduction in Area: 70.6% % Reduction in Volume: 80.4% Epithelialization: None Wound Description Classification: Grade 2 Wound Margin: Thickened Exudate Amount: Medium Exudate Type: Serosanguineous Exudate Color: red, brown Foul Odor After Cleansing: No Slough/Fibrino Yes Wound Bed Granulation Amount: Large (67-100%) Exposed Structure Granulation Quality: Red Fascia Exposed: No Necrotic Amount: Small (1-33%) Fat Layer (Subcutaneous Tissue) Exposed: Yes Necrotic Quality: Adherent Slough Tendon Exposed: No Muscle Exposed: No Joint Exposed: No Bone Exposed: No Treatment Notes Wound #1 (Foot) Wound Laterality: Plantar, Right Cleanser Normal Saline Discharge Instruction: Wash your hands with soap and water. Remove old dressing, discard into plastic bag and place into trash. Cleanse the wound with Normal Saline prior to applying a clean dressing using gauze sponges, not tissues or cotton balls. Do not scrub or use excessive force. Pat dry using gauze sponges, not tissue or cotton balls. MAURICIO, DAHLEN ([id number]) Soap and Water Discharge Instruction: Gently cleanse wound with antibacterial soap, rinse and pat dry prior to dressing wounds Peri-Wound Care Topical keystone antibiotic Primary Dressing Hydrofera Blue Ready Transfer Foam, 2.5x2.5 (  in/in) Discharge  Instruction: Apply Hydrofera Blue Ready to wound bed as directed Secondary Dressing Gauze Discharge Instruction: As directed: dry, moistened with saline or moistened with Dakins Solution Secured With Conform 4'' - Conforming Stretch Gauze Bandage 4x75 (in/in) Discharge Instruction: Apply as directed Compression Wrap Compression Stockings Add-Ons Electronic Signature(s) Signed: 05/04/2022 4:13:21 PM By: Levora Dredge Signed: 05/05/2022 4:13:59 PM By: Massie Kluver Entered By: Massie Kluver on 05/04/2022 08:52:19 [name] ([id number]) -------------------------------------------------------------------------------- Vitals Details Patient Name: [name] Date of Service: 05/04/2022 9:00 AM Medical Record Number: [id number] Patient Account Number: [id number] Date of Birth/Sex: 1956-06-21 (65 y.o. M) Treating RN: Levora Dredge Primary Care : [name] Other Clinician: Massie Kluver Referring : [name] Treating /Extender: Yaakov Guthrie in Treatment: 25 Vital Signs Time Taken: 08:43 Temperature (F): 97.7 Height (in): 74 Pulse (bpm): 62 Weight (lbs): 226 Respiratory Rate (breaths/min): 18 Body Mass Index (BMI): 29 Blood Pressure (mmHg): 115/69 Reference Range: 80 - 120 mg / dl Electronic Signature(s) Signed: 05/05/2022 4:13:59 PM By: Massie Kluver Entered By: Massie Kluver on 05/04/2022 08:45:24

## 2022-05-11 ENCOUNTER — Encounter (HOSPITAL_BASED_OUTPATIENT_CLINIC_OR_DEPARTMENT_OTHER): Payer: Medicare Other | Admitting: Internal Medicine

## 2022-05-11 DIAGNOSIS — L97512 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ed: Secondary | ICD-10-CM | POA: Diagnosis not present

## 2022-05-11 DIAGNOSIS — E11621 Type 2 diabetes mellitus with foot ulcer: Secondary | ICD-10-CM | POA: Diagnosis not present

## 2022-05-11 DIAGNOSIS — R2689 Other abnormalities of gait and mobility: Secondary | ICD-10-CM

## 2022-05-11 DIAGNOSIS — E1140 Type 2 diabetes mellitus with diabetic neuropathy, unspecified: Secondary | ICD-10-CM

## 2022-05-11 NOTE — Progress Notes (Signed)
[name], [name] ([id number]) Visit Report for 05/11/2022 Arrival Information Details Patient Name: [name], [name] Date of Service: 05/11/2022 3:00 PM Medical Record Number: [id number] Patient Account Number: [id number] Date of Birth/Sex: 1956/07/15 (66 y.o. M) Treating RN: Levora Dredge Primary Care Amram Maya: [name] Other Clinician: Referring Markie Frith: [name] Treating Skilynn Durney/Extender: Yaakov Guthrie in Treatment: 26 Visit Information History Since Last Visit Added or deleted any medications: No Patient Arrived: Ambulatory Any new allergies or adverse reactions: No Arrival Time: 15:06 Had a fall or experienced change in No Accompanied By: self activities of daily living that may affect Transfer Assistance: None risk of falls: Patient Identification Verified: Yes Hospitalized since last visit: No Secondary Verification Process Completed: Yes Has Dressing in Place as Prescribed: Yes Patient Requires Transmission-Based No Pain Present Now: Yes Precautions: Patient Has Alerts: Yes Patient Alerts: Patient on Blood Thinner DIABETIC Plavix Electronic Signature(s) Signed: 05/11/2022 3:48:03 PM By: Levora Dredge Entered By: Levora Dredge on 05/11/2022 15:08:16 [name] ([id number]) -------------------------------------------------------------------------------- Clinic Level of Care Assessment Details Patient Name: [name] Date of Service: 05/11/2022 3:00 PM Medical Record Number: [id number] Patient Account Number: [id number] Date of Birth/Sex: 1956/06/23 (66 y.o. M) Treating RN: Levora Dredge Primary Care Khanh Cordner: [name] Other Clinician: Referring Albion Weatherholtz: [name] Treating Laresa Oshiro/Extender: Yaakov Guthrie in Treatment: 26 Clinic Level of Care Assessment Items TOOL 4 Quantity Score []  - Use when only an EandM is performed on FOLLOW-UP visit 0 ASSESSMENTS - Nursing Assessment / Reassessment X - Reassessment of  Co-morbidities (includes updates in patient status) 1 10 X- 1 5 Reassessment of Adherence to Treatment Plan ASSESSMENTS - Wound and Skin Assessment / Reassessment X - Simple Wound Assessment / Reassessment - one wound 1 5 []  - 0 Complex Wound Assessment / Reassessment - multiple wounds []  - 0 Dermatologic / Skin Assessment (not related to wound area) ASSESSMENTS - Focused Assessment []  - Circumferential Edema Measurements - multi extremities 0 []  - 0 Nutritional Assessment / Counseling / Intervention []  - 0 Lower Extremity Assessment (monofilament, tuning fork, pulses) []  - 0 Peripheral Arterial Disease Assessment (using hand held doppler) ASSESSMENTS - Ostomy and/or Continence Assessment and Care []  - Incontinence Assessment and Management 0 []  - 0 Ostomy Care Assessment and Management (repouching, etc.) PROCESS - Coordination of Care X - Simple Patient / Family Education for ongoing care 1 15 []  - 0 Complex (extensive) Patient / Family Education for ongoing care []  - 0 Staff obtains Programmer, systems, Records, Test Results / Process Orders []  - 0 Staff telephones HHA, Nursing Homes / Clarify orders / etc []  - 0 Routine Transfer to another Facility (non-emergent condition) []  - 0 Routine Hospital Admission (non-emergent condition) []  - 0 New Admissions / Biomedical engineer / Ordering NPWT, Apligraf, etc. []  - 0 Emergency Hospital Admission (emergent condition) X- 1 10 Simple Discharge Coordination []  - 0 Complex (extensive) Discharge Coordination PROCESS - Special Needs []  - Pediatric / Minor Patient Management 0 []  - 0 Isolation Patient Management []  - 0 Hearing / Language / Visual special needs []  - 0 Assessment of Community assistance (transportation, D/C planning, etc.) []  - 0 Additional assistance / Altered mentation []  - 0 Support Surface(s) Assessment (bed, cushion, seat, etc.) INTERVENTIONS - Wound Cleansing / Measurement Slabach, Khing ([id number]) X- 1  5 Simple Wound Cleansing - one wound []  - 0 Complex Wound Cleansing - multiple wounds X- 1 5 Wound Imaging (photographs - any number of wounds) []  - 0 Wound Tracing (instead of photographs) X- 1 5 Simple Wound  Measurement - one wound []  - 0 Complex Wound Measurement - multiple wounds INTERVENTIONS - Wound Dressings X - Small Wound Dressing one or multiple wounds 1 10 []  - 0 Medium Wound Dressing one or multiple wounds []  - 0 Large Wound Dressing one or multiple wounds X- 1 5 Application of Medications - topical []  - 0 Application of Medications - injection INTERVENTIONS - Miscellaneous []  - External ear exam 0 []  - 0 Specimen Collection (cultures, biopsies, blood, body fluids, etc.) []  - 0 Specimen(s) / Culture(s) sent or taken to Lab for analysis []  - 0 Patient Transfer (multiple staff / Harrel Lemon Lift / Similar devices) []  - 0 Simple Staple / Suture removal (25 or less) []  - 0 Complex Staple / Suture removal (26 or more) []  - 0 Hypo / Hyperglycemic Management (close monitor of Blood Glucose) []  - 0 Ankle / Brachial Index (ABI) - do not check if billed separately X- 1 5 Vital Signs Has the patient been seen at the hospital within the last three years: Yes Total Score: 80 Level Of Care: New/Established - Level 3 Electronic Signature(s) Signed: 05/11/2022 3:48:03 PM By: Levora Dredge Entered By: Levora Dredge on 05/11/2022 15:45:09 [name] ([id number]) -------------------------------------------------------------------------------- Encounter Discharge Information Details Patient Name: [name] Date of Service: 05/11/2022 3:00 PM Medical Record Number: [id number] Patient Account Number: [id number] Date of Birth/Sex: 06/13/1956 (66 y.o. M) Treating RN: Levora Dredge Primary Care Eugen Jeansonne: [name] Other Clinician: Referring Shunta Mclaurin: [name] Treating Monta Maiorana/Extender: Yaakov Guthrie in Treatment: 61 Encounter Discharge  Information Items Discharge Condition: Stable Ambulatory Status: Ambulatory Discharge Destination: Home Transportation: Private Auto Accompanied By: self Schedule Follow-up Appointment: Yes Clinical Summary of Care: Electronic Signature(s) Signed: 05/11/2022 3:48:03 PM By: Levora Dredge Entered By: Levora Dredge on 05/11/2022 15:45:59 [name] ([id number]) -------------------------------------------------------------------------------- Lower Extremity Assessment Details Patient Name: [name] Date of Service: 05/11/2022 3:00 PM Medical Record Number: [id number] Patient Account Number: [id number] Date of Birth/Sex: [birth year] (66 y.o. M) Treating RN: Levora Dredge Primary Care Hennesy Sobalvarro: [name] Other Clinician: Referring Larkin Morelos: [name] Treating De Jaworski/Extender: Yaakov Guthrie in Treatment: 26 Edema Assessment Assessed: [Left: No] [Right: No] Edema: [Left: N] [Right: o] Vascular Assessment Pulses: Dorsalis Pedis Palpable: [Right:Yes] Electronic Signature(s) Signed: 05/11/2022 3:48:03 PM By: Levora Dredge Entered By: Levora Dredge on 05/11/2022 15:16:32 [name] ([id number]) -------------------------------------------------------------------------------- Multi Wound Chart Details Patient Name: [name] Date of Service: 05/11/2022 3:00 PM Medical Record Number: [id number] Patient Account Number: [id number] Date of Birth/Sex: 1955/12/30 (66 y.o. M) Treating RN: Levora Dredge Primary Care Kalecia Hartney: [name] Other Clinician: Referring Sandy Blouch: [name] Treating Hitomi Slape/Extender: Yaakov Guthrie in Treatment: 26 Vital Signs Height(in): 74 Pulse(bpm): 76 Weight(lbs): 226 Blood Pressure(mmHg): 132/76 Body Mass Index(BMI): 29 Temperature(F): 98.1 Respiratory Rate(breaths/min): 18 Photos: [N/A:N/A] Wound Location: Right, Plantar Foot N/A N/A Wounding Event: Blister N/A N/A Primary Etiology:  Diabetic Wound/Ulcer of the Lower N/A N/A Extremity Comorbid History: Type II Diabetes, Osteoarthritis N/A N/A Date Acquired: 07/15/2020 N/A N/A Weeks of Treatment: 26 N/A N/A Wound Status: Open N/A N/A Wound Recurrence: No N/A N/A Measurements L x W x D (cm) 0.8x7x0.3 N/A N/A Area (cm) : 4.398 N/A N/A Volume (cm) : 1.319 N/A N/A % Reduction in Area: -291.60% N/A N/A % Reduction in Volume: -95.70% N/A N/A Starting Position 1 (o'clock): 12 Ending Position 1 (o'clock): 12 Maximum Distance 1 (cm): 0.3 Undermining: Yes N/A N/A Classification: Grade 2 N/A N/A Exudate Amount: Medium N/A N/A Exudate Type: Serosanguineous N/A N/A Exudate Color: red, brown N/A  N/A Wound Margin: Thickened N/A N/A Granulation Amount: Large (67-100%) N/A N/A Granulation Quality: Pink N/A N/A Necrotic Amount: Small (1-33%) N/A N/A Exposed Structures: Fat Layer (Subcutaneous Tissue): N/A N/A Yes Fascia: No Tendon: No Muscle: No Joint: No Bone: No Epithelialization: None N/A N/A Treatment Notes Wound #1 (Foot) Wound Laterality: Plantar, Right Cleanser Normal Saline Focht, Zak ([id number]) Discharge Instruction: Wash your hands with soap and water. Remove old dressing, discard into plastic bag and place into trash. Cleanse the wound with Normal Saline prior to applying a clean dressing using gauze sponges, not tissues or cotton balls. Do not scrub or use excessive force. Pat dry using gauze sponges, not tissue or cotton balls. Soap and Water Discharge Instruction: Gently cleanse wound with antibacterial soap, rinse and pat dry prior to dressing wounds Peri-Wound Care Topical keystone antibiotic Primary Dressing Hydrofera Blue Ready Transfer Foam, 2.5x2.5 (in/in) Discharge Instruction: Apply Hydrofera Blue Ready to wound bed as directed Secondary Dressing Gauze Discharge Instruction: As directed: dry, moistened with saline or moistened with Dakins Solution Secured With Conform 4'' - Conforming  Stretch Gauze Bandage 4x75 (in/in) Discharge Instruction: Apply as directed Compression Wrap Compression Stockings Add-Ons Electronic Signature(s) Signed: 05/11/2022 3:56:24 PM By: Kalman Shan DO Previous Signature: 05/11/2022 3:48:03 PM Version By: Levora Dredge Entered By: Kalman Shan on 05/11/2022 15:56:07 [name] ([id number]) -------------------------------------------------------------------------------- Multi-Disciplinary Care Plan Details Patient Name: [name] Date of Service: 05/11/2022 3:00 PM Medical Record Number: [id number] Patient Account Number: [id number] Date of Birth/Sex: 1956-07-02 (66 y.o. M) Treating RN: Levora Dredge Primary Care Tereza Gilham: [name] Other Clinician: Referring Maki Sweetser: [name] Treating Bear Osten/Extender: Yaakov Guthrie in Treatment: 75 Active Inactive Wound/Skin Impairment Nursing Diagnoses: Impaired tissue integrity Knowledge deficit related to smoking impact on wound healing Knowledge deficit related to ulceration/compromised skin integrity Goals: Patient/caregiver will verbalize understanding of skin care regimen Date Initiated: 11/10/2021 Date Inactivated: 12/01/2021 Target Resolution Date: 11/17/2021 Goal Status: Met Ulcer/skin breakdown will have a volume reduction of 30% by week 4 Date Initiated: 11/10/2021 Date Inactivated: 02/02/2022 Target Resolution Date: 12/08/2021 Goal Status: Met Ulcer/skin breakdown will have a volume reduction of 50% by week 8 Date Initiated: 11/10/2021 Target Resolution Date: 01/05/2022 Goal Status: Active Ulcer/skin breakdown will have a volume reduction of 80% by week 12 Date Initiated: 11/10/2021 Target Resolution Date: 02/02/2022 Goal Status: Active Ulcer/skin breakdown will heal within 14 weeks Date Initiated: 11/10/2021 Target Resolution Date: 02/16/2022 Goal Status: Active Interventions: Assess patient/caregiver ability to obtain necessary supplies Assess  patient/caregiver ability to perform ulcer/skin care regimen upon admission and as needed Assess ulceration(s) every visit Notes: Electronic Signature(s) Signed: 05/11/2022 3:48:03 PM By: Levora Dredge Entered By: Levora Dredge on 05/11/2022 15:34:53 [name] ([id number]) -------------------------------------------------------------------------------- Pain Assessment Details Patient Name: [name] Date of Service: 05/11/2022 3:00 PM Medical Record Number: [id number] Patient Account Number: [id number] Date of Birth/Sex: 06/02/1956 (66 y.o. M) Treating RN: Levora Dredge Primary Care Gisela Lea: [name] Other Clinician: Referring Travin Marik: [name] Treating Jerauld Bostwick/Extender: Yaakov Guthrie in Treatment: 26 Active Problems Location of Pain Severity and Description of Pain Patient Has Paino Yes Site Locations Rate the pain. Current Pain Level: 5 Pain Management and Medication Current Pain Management: Notes pt states pain at wound site Electronic Signature(s) Signed: 05/11/2022 3:48:03 PM By: Levora Dredge Entered By: Levora Dredge on 05/11/2022 15:10:18 [name] ([id number]) -------------------------------------------------------------------------------- Patient/Caregiver Education Details Patient Name: [name] Date of Service: 05/11/2022 3:00 PM Medical Record Number: [id number] Patient Account Number: [id number] Date of Birth/Gender: [birth year] (66 y.o. M) Treating RN:  Levora Dredge Primary Care Physician: [name] Other Clinician: Referring Physician: [name] Treating Physician/Extender: Yaakov Guthrie in Treatment: 51 Education Assessment Education Provided To: Patient Education Topics Provided Wound/Skin Impairment: Handouts: Caring for Your Ulcer Methods: Explain/Verbal Responses: State content correctly Electronic Signature(s) Signed: 05/11/2022 3:48:03 PM By: Levora Dredge Entered By:  Levora Dredge on 05/11/2022 15:45:25 [name] ([id number]) -------------------------------------------------------------------------------- Wound Assessment Details Patient Name: [name] Date of Service: 05/11/2022 3:00 PM Medical Record Number: [id number] Patient Account Number: [id number] Date of Birth/Sex: [birth year] (66 y.o. M) Treating RN: Levora Dredge Primary Care Alyssabeth Bruster: [name] Other Clinician: Referring Monifa Blanchette: [name] Treating Emon Lance/Extender: Yaakov Guthrie in Treatment: 26 Wound Status Wound Number: 1 Primary Etiology: Diabetic Wound/Ulcer of the Lower Extremity Wound Location: Right, Plantar Foot Wound Status: Open Wounding Event: Blister Comorbid History: Type II Diabetes, Osteoarthritis Date Acquired: 07/15/2020 Weeks Of Treatment: 26 Clustered Wound: No Photos Wound Measurements Length: (cm) 0.8 % Reduct Width: (cm) 7 % Reduct Depth: (cm) 0.3 Epitheli Area: (cm) 4.398 Tunneli Volume: (cm) 1.319 Undermi Start Endin Maxim ion in Area: -291.6% ion in Volume: -95.7% alization: None ng: No ning: Yes ing Position (o'clock): 12 g Position (o'clock): 12 um Distance: (cm) 0.3 Wound Description Classification: Grade 2 Foul Od Wound Margin: Thickened Slough/ Exudate Amount: Medium Exudate Type: Serosanguineous Exudate Color: red, brown or After Cleansing: No Fibrino Yes Wound Bed Granulation Amount: Large (67-100%) Exposed Structure Granulation Quality: Pink Fascia Exposed: No Necrotic Amount: Small (1-33%) Fat Layer (Subcutaneous Tissue) Exposed: Yes Necrotic Quality: Adherent Slough Tendon Exposed: No Muscle Exposed: No Joint Exposed: No Bone Exposed: No Treatment Notes Wound #1 (Foot) Wound Laterality: Plantar, Right Cleanser Budnick, Braddock ([id number]) Normal Saline Discharge Instruction: Wash your hands with soap and water. Remove old dressing, discard into plastic bag and place into trash. Cleanse  the wound with Normal Saline prior to applying a clean dressing using gauze sponges, not tissues or cotton balls. Do not scrub or use excessive force. Pat dry using gauze sponges, not tissue or cotton balls. Soap and Water Discharge Instruction: Gently cleanse wound with antibacterial soap, rinse and pat dry prior to dressing wounds Peri-Wound Care Topical keystone antibiotic Primary Dressing Hydrofera Blue Ready Transfer Foam, 2.5x2.5 (in/in) Discharge Instruction: Apply Hydrofera Blue Ready to wound bed as directed Secondary Dressing Gauze Discharge Instruction: As directed: dry, moistened with saline or moistened with Dakins Solution Secured With Conform 4'' - Conforming Stretch Gauze Bandage 4x75 (in/in) Discharge Instruction: Apply as directed Compression Wrap Compression Stockings Add-Ons Electronic Signature(s) Signed: 05/11/2022 3:48:03 PM By: Levora Dredge Entered By: Levora Dredge on 05/11/2022 15:16:17 [name] ([id number]) -------------------------------------------------------------------------------- Vitals Details Patient Name: [name] Date of Service: 05/11/2022 3:00 PM Medical Record Number: [id number] Patient Account Number: [id number] Date of Birth/Sex: [birth year] (66 y.o. M) Treating RN: Levora Dredge Primary Care Iva Posten: [name] Other Clinician: Referring Alice Vitelli: [name] Treating Donielle Radziewicz/Extender: Yaakov Guthrie in Treatment: 26 Vital Signs Time Taken: 15:08 Temperature (F): 98.1 Height (in): 74 Pulse (bpm): 76 Weight (lbs): 226 Respiratory Rate (breaths/min): 18 Body Mass Index (BMI): 29 Blood Pressure (mmHg): 132/76 Reference Range: 80 - 120 mg / dl Electronic Signature(s) Signed: 05/11/2022 3:48:03 PM By: Levora Dredge Entered By: Levora Dredge on 05/11/2022 15:09:51

## 2022-05-12 ENCOUNTER — Encounter: Payer: Self-pay | Admitting: Ophthalmology

## 2022-05-12 NOTE — Progress Notes (Signed)
[name], [name] ([id number]) Visit Report for 05/11/2022 Chief Complaint Document Details Patient Name: [name], [name] Date of Service: 05/11/2022 3:00 PM Medical Record Number: [id number] Patient Account Number: [id number] Date of Birth/Sex: Jun 24, 1956 (65 y.o. M) Treating RN: Huel Coventry Primary Care Provider: Manson Allan Other Clinician: Referring Provider: Manson Allan Treating Provider/Extender: Tilda Franco in Treatment: 61 Information Obtained from: Patient Chief Complaint Right plantar foot wound Electronic Signature(s) Signed: 05/11/2022 3:56:24 PM By: Geralyn Corwin DO Entered By: Geralyn Corwin on 05/11/2022 15:53:05 [name] ([id number]) -------------------------------------------------------------------------------- HPI Details Patient Name: [name] Date of Service: 05/11/2022 3:00 PM Medical Record Number: [id number] Patient Account Number: [id number] Date of Birth/Sex: November 03, 1955 (65 y.o. M) Treating RN: Huel Coventry Primary Care Provider: Manson Allan Other Clinician: Referring Provider: Manson Allan Treating Provider/Extender: Tilda Franco in Treatment: 73 History of Present Illness HPI Description: Admission 11/10/2021 [name] is a 66 year old male with a past medical history of uncontrolled type 2 diabetes with last hemoglobin A1c of 8.1 complicated by peripheral neuropathy that presents to the clinic for a 2-year history of nonhealing ulcer to the bottom of his right foot. He states this started out as a blister caused by a work boot. He reports receiving wound care when he resided in [place]. He is reestablishing his wound care in [facility] today. He is currently keeping the area clean and covered. He has insoles designed to help offload the wound bed. He currently denies signs of infection. 1/18; patient presents for follow-up. He has been using Hydrofera Blue to the wound bed. He has no issues or  complaints today. He has not received the defender.[name] He denies signs of infection. 2/1; patient presents for follow-up. He has been using Hydrofera Blue to the wound bed he states he received the defender boot and has been using it however he did not have it on today. He currently denies signs of infection to the right foot. Unfortunately he developed a wound to the left great toe over the past week. He states he received new orthotics and these caused a blister to the left great toe which turned into a wound. He has not been doing anything to the wound bed. He currently denies systemic signs of infection. 2/8; Patient presents for follow-up. 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 He states he is about 90% recovered. He has some mild weakness to the left side. He reports taking the antibiotics prescribed at last clinic visit. He reports using Hydrofera Blue for dressing changes. He only uses the offloading boot when he is at home. He uses open toed shoes to the right foot. He currently denies signs of infection. 2/15; patient presents for follow-up. He states he is still taking the antibiotics prescribed. He reports using the defender boot to the right foot and open toed shoe to the left foot however he is not wearing either today. He currently denies systemic signs of infection. He stated he cut down a tree last week and was outside doing yard work. 2/22; patient presents for follow-up. 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 He reports feeling well. He reports using the defender boot to the right leg and continues to use Hydrofera Blue with dressing changes. He denies systemic signs of infection. 3/8; patient presents for follow-up. He sees Dr. Alberteen Spindle tomorrow for follow-up of the left  great toe amputation. This was a partial amputation. He reports  he is taking Augmentin currently. He reports increased erythema to the left great toe amputation site. He also reports a decline in his right plantar foot wound. He reports it is draining more. He denies purulent drainage. 3/15; patient presents for follow-up. He did not see Dr. Alberteen Spindle last week but states he sees him today. He states he has been taking doxycycline in the past week and started gentamicin ointment. He continues to use [facility] with dressing changes. He currently denies systemic signs of infection. 3/22; patient presents for follow-up. Patient had partial amputation of the left great toe on 2/15. 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 Alberteen Spindle. He reports no issues and has no complaints. He is currently taking Keflex and doxycycline prescribed by Dr. Alberteen Spindle. He continues to use gentamicin ointment and Hydrofera Blue to the right foot wound. He reports using the defender boot when he is at home. 3/29; patient presents for follow-up. He continues to be on antibiotics per podiatry. He has been using Hydrofera Blue and gentamicin ointment to the right plantar foot wound. He has no issues or complaints today. He denies signs of infection. 4/5; patient presents for follow-up. He has been using Dakin's wet-to-dry dressings with improvement to wound healing. He denies signs of infection. She has no issues or complaints today. 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 Use Hydrofera Blue for a prolonged period of time and has been using Dakin's wet- to-dry I think for the last 2 to 3 weeks. He has a Psychologist, forensic at home but he does not wear that coming into the clinic because he drives. He tells me he has been compliant with the defender boot short of when he goes out to drive. He lives alone. He also tells me he has had a previous left great toe amputation We are following him for the right foot wound. 4/26; patient presents for  follow-up. He had worsening of his left foot wound and had surgical debridement on 4/16 by Dr. Fanny Skates. He is being followed by podiatry for this issue. He he is on IV Unasyn based on tissue culture by ID.[name] We are following him for the right foot wound. He has been using Dakin's wet-to-dry dressings without issues. He reports continued usage of the defender boot. 5/3; patient presents for follow-up. He has been using Dakin's wet-to-dry dressings to the right foot wound. He has no issues or complaints today. He reports using the Psychologist, forensic. He reports offloading the foot wound when he is at home by sitting in a recliner. He denies signs of infection. He is still getting IV antibiotics For the left foot wound that is being followed by podiatry. He states the end date is later this month on 5/28. 5/17; patient presents for follow-up. He missed his last clinic appointment. He has been using Dakin's wet-to-dry dressings. He states he is offloading the right foot wound with the defender boot although he does not have this today. He is still on IV antibiotics. He has no issues or complaints today. 5/24; patient presents for follow-up. He has been using Hydrofera Blue to the wound bed without issues. He denies signs of infection. At last clinic [facility], [place] ([id number]) visit he had a wound culture done that detected high levels of Enterococcus faecalis and low levels of Corynebacterium stratum and Staphylococcus epidermidis. [facility] antibiotics were ordered. He has not heard from  the company yet. 5/31; patient presents for follow-up. He continues to use Hydrofera Blue to the wound bed. He denies signs of infection. Keystone antibiotics were ordered at last clinic visit and he states he called the company to pay for it. He states that it is being shipped. We rediscussed the total contact cast and he declines having this placed today. He states he is wearing the defender boot however he never wears it  into the office. 04-06-2022 upon evaluation today patient appears to be doing about the same in regard to his wound. This is not significantly smaller. He is not wearing his offloading boot. We discussed that today he tells me that he wears it "all the time as he chuckled and does not have it on during the office visit today. I think it is increasingly obvious that he is just messing around when he says this based on what I am seeing and otherwise I do not know why he would wear it all the time but not to the clinic. Either way my opinion based on what I am seeing currently is simply that the wound does seem to be still having quite a bit of callus buildup around the edges it is also very dry. He does tell me that he has been using the [facility] since he got it and to be honest I think this is good and should hopefully help keep things under control but at the same time I do not see any evidence of active infection. Again the Jodie Echevaria is a topical antibiotic with multiple compounds to help prevent further infection and worsening overall. 6/14; patient presents for follow-up. He has been using Keystone antibiotics with [employer]	[place] daily. He states he is wearing his offloading boot. He does not have this in office today. He is going to the beach for the next 5 days. He states he Will not be using using any offloading device During this time. I again offered the total contact cast however he declined. I did ask him to reconsider for next clinic visit. He currently denies signs of infection. 6/21; patient presents for follow-up. He went to the beach last week. He did not use an offloading device. Wound is slightly deeper today. I again offered the total contact cast but he declined. Currently denies signs of infection. He reports using the [place], hydrofera blue and the Psychologist, forensic. 6/28; patient presents for follow-up. Patient declines total contact cast today. He has been using Keystone and  [facility] to the wound bed. It is unclear if he is using the Psychologist, forensic. He would like a letter for the post man to delivers mail to his front door so he does not have to walk down the driveway. 7/5; patient presents for follow-up. He states he is considering the total contact cast however does not want this placed today. He is actually going to the gym after this appointment. He does not want offloading foot wear for this. He has been using Keystone and [facility] to the wound bed. He denies signs of infection. 7/12; patient presents for follow-up. He is still considering the total contact cast however does not want this today. He has been using Keystone antibiotic and Hydrofera Blue to the wound bed. He denies signs of infection. He has no issues or complaints today. Electronic Signature(s) Signed: 05/11/2022 3:56:24 PM By: Geralyn Corwin DO Entered By: Geralyn Corwin on 05/11/2022 15:53:34 [name] ([id number]) -------------------------------------------------------------------------------- Physical Exam Details Patient Name: [name] Date of  Service: 05/11/2022 3:00 PM Medical Record Number: [id number] Patient Account Number: [id number] Date of Birth/Sex: 1956-08-17 (65 y.o. M) Treating RN: Huel Coventry Primary Care Provider: Manson Allan Other Clinician: Referring Provider: Manson Allan Treating Provider/Extender: Tilda Franco in Treatment: 26 Constitutional . Cardiovascular . Psychiatric . Notes Right foot: To the plantar aspect of the first metatarsal there is an open wound with granulation tissue. No surrounding signs of infection. Electronic Signature(s) Signed: 05/11/2022 3:56:24 PM By: Geralyn Corwin DO Entered By: Geralyn Corwin on 05/11/2022 15:54:14 [name] ([id number]) -------------------------------------------------------------------------------- Physician Orders Details Patient Name: [name] Date of Service:  05/11/2022 3:00 PM Medical Record Number: [id number] Patient Account Number: [id number] Date of Birth/Sex: [birth year] (65 y.o. M) Treating RN: Angelina Pih Primary Care Provider: Manson Allan Other Clinician: Referring Provider: Manson Allan Treating Provider/Extender: Tilda Franco in Treatment: 25 Verbal / Phone Orders: No Diagnosis Coding Follow-up Appointments o Return Appointment in 1 week. o Nurse Visit as needed Bathing/ Shower/ Hygiene o May shower; gently cleanse wound with antibacterial soap, rinse and pat dry prior to dressing wounds o No tub bath. Anesthetic (Use 'Patient Medications' Section for Anesthetic Order Entry) o Lidocaine applied to wound bed Edema Control - Lymphedema / Segmental Compressive Device / Other o Elevate, Exercise Daily and Avoid Standing for Long Periods of Time. o Elevate leg(s) parallel to the floor when sitting. o DO YOUR BEST to sleep in the bed at night. DO NOT sleep in your recliner. Long hours of sitting in a recliner leads to swelling of the legs and/or potential wounds on your backside. Off-Loading o Foot Defender o - right foot-keep shin covered-keep pressure off of the wound (Patient is not wear boot to clinic each week. Wearing tennis shoe) Additional Orders / Instructions o Follow Nutritious Diet and Increase Protein Intake - monitor blood sugar to maintain normal limits o Other: - Follow up post surgery for left foot-[facility] handling Medications-Please add to medication list. o P.O. Antibiotics - apply Keystone cream every other day Wound Treatment Wound #1 - Foot Wound Laterality: Plantar, Right Cleanser: Normal Saline Every Other Day/30 Days Discharge Instructions: Wash your hands with soap and water. Remove old dressing, discard into plastic bag and place into trash. Cleanse the wound with Normal Saline prior to applying a clean dressing using gauze sponges, not tissues or cotton  balls. Do not scrub or use excessive force. Pat dry using gauze sponges, not tissue or cotton balls. Cleanser: Soap and Water Every Other Day/30 Days Discharge Instructions: Gently cleanse wound with antibacterial soap, rinse and pat dry prior to dressing wounds Topical: keystone antibiotic Every Other Day/30 Days Primary Dressing: Hydrofera Blue Ready Transfer Foam, 2.5x2.5 (in/in) Every Other Day/30 Days Discharge Instructions: Apply Hydrofera Blue Ready to wound bed as directed Secondary Dressing: Gauze Every Other Day/30 Days Discharge Instructions: As directed: dry, moistened with saline or moistened with Dakins Solution Secured With: Conform 4'' - Conforming Stretch Gauze Bandage 4x75 (in/in) Every Other Day/30 Days Discharge Instructions: Apply as directed Electronic Signature(s) Signed: 05/11/2022 3:56:24 PM By: Geralyn Corwin DO Previous Signature: 05/11/2022 3:48:03 PM Version By: Angelina Pih Entered By: Geralyn Corwin on 05/11/2022 15:55:34 [name] ([id number]) [name] ([id number]) -------------------------------------------------------------------------------- Problem List Details Patient Name: [name] Date of Service: 05/11/2022 3:00 PM Medical Record Number: [id number] Patient Account Number: [id number] Date of Birth/Sex: 27-Nov-1955 (65 y.o. M) Treating RN: Huel Coventry Primary Care Provider: Manson Allan Other Clinician: Referring Provider: Manson Allan Treating Provider/Extender: Tilda Franco in Treatment: 36  Active Problems ICD-10 Encounter Code Description Active Date MDM Diagnosis L97.512 Non-pressure chronic ulcer of other part of right foot with fat layer 11/10/2021 No Yes exposed E11.621 Type 2 diabetes mellitus with foot ulcer 11/10/2021 No Yes E11.40 Type 2 diabetes mellitus with diabetic neuropathy, unspecified 11/10/2021 No Yes R26.89 Other abnormalities of gait and mobility 11/10/2021 No Yes Inactive  Problems ICD-10 Code Description Active Date Inactive Date L97.528 Non-pressure chronic ulcer of other part of left foot with other specified 12/01/2021 12/01/2021 severity Resolved Problems Electronic Signature(s) Signed: 05/11/2022 3:56:24 PM By: Geralyn Corwin DO Entered By: Geralyn Corwin on 05/11/2022 15:53:01 [name] ([id number]) -------------------------------------------------------------------------------- Progress Note Details Patient Name: [name] Date of Service: 05/11/2022 3:00 PM Medical Record Number: [id number] Patient Account Number: [id number] Date of Birth/Sex: 08-22-1956 (65 y.o. M) Treating RN: Huel Coventry Primary Care Provider: Manson Allan Other Clinician: Referring Provider: Manson Allan Treating Provider/Extender: Tilda Franco in Treatment: 26 Subjective Chief Complaint Information obtained from Patient Right plantar foot wound History of Present Illness (HPI) Admission 11/10/2021 Mr. Chaos Carlile is a 66 year old male with a past medical history of uncontrolled type 2 diabetes with last hemoglobin A1c of 8.1 complicated by peripheral neuropathy that presents to the clinic for a 2-year history of nonhealing ulcer to the bottom of his right foot. He states this started out as a blister caused by a work boot. He reports receiving wound care when he resided in [place]. He is reestablishing his wound care in [facility] today. He is currently keeping the area clean and covered. He has insoles designed to help offload the wound bed. He currently denies signs of infection. 1/18; patient presents for follow-up. He has been using Hydrofera Blue to the wound bed. He has no issues or complaints today. He has not received the defender.[name] He denies signs of infection. 2/1; patient presents for follow-up. He has been using Hydrofera Blue to the wound bed he states he received the defender boot and has been using it however he did not  have it on today. He currently denies signs of infection to the right foot. Unfortunately he developed a wound to the left great toe over the past week. He states he received new orthotics and these caused a blister to the left great toe which turned into a wound. He has not been doing anything to the wound bed. He currently denies systemic signs of infection. 2/8; Patient presents for follow-up. 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 He states he is about 90% recovered. He has some mild weakness to the left side. He reports taking the antibiotics prescribed at last clinic visit. He reports using Hydrofera Blue for dressing changes. He only uses the offloading boot when he is at home. He uses open toed shoes to the right foot. He currently denies signs of infection. 2/15; patient presents for follow-up. He states he is still taking the antibiotics prescribed. He reports using the defender boot to the right foot and open toed shoe to the left foot however he is not wearing either today. He currently denies systemic signs of infection. He stated he cut down a tree last week and was outside doing yard work. 2/22; patient presents for follow-up. 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 He reports feeling well. He reports using the defender boot to the right leg  and continues to use [facility] with dressing changes. He denies systemic signs of infection. 3/8; patient presents for follow-up. He sees Dr. Alberteen Spindle tomorrow for follow-up of the left great toe amputation. This was a partial amputation. He reports he is taking Augmentin currently. He reports increased erythema to the left great toe amputation site. He also reports a decline in his right plantar foot wound. He reports it is draining more. He denies purulent drainage. 3/15; patient presents for  follow-up. He did not see Dr. Alberteen Spindle last week but states he sees him today. He states he has been taking doxycycline in the past week and started gentamicin ointment. He continues to use [facility] with dressing changes. He currently denies systemic signs of infection. 3/22; patient presents for follow-up. Patient had partial amputation of the left great toe on 2/15. 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 Alberteen Spindle. He reports no issues and has no complaints. He is currently taking Keflex and doxycycline prescribed by Dr. Alberteen Spindle. He continues to use gentamicin ointment and Hydrofera Blue to the right foot wound. He reports using the defender boot when he is at home. 3/29; patient presents for follow-up. He continues to be on antibiotics per podiatry. He has been using Hydrofera Blue and gentamicin ointment to the right plantar foot wound. He has no issues or complaints today. He denies signs of infection. 4/5; patient presents for follow-up. He has been using Dakin's wet-to-dry dressings with improvement to wound healing. He denies signs of infection. She has no issues or complaints today. 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 Use Hydrofera Blue for a prolonged period of time and has been using Dakin's wet- to-dry I think for the last 2 to 3 weeks. He has a Psychologist, forensic at home but he does not wear that coming into the clinic because he drives. He tells me he has been compliant with the defender boot short of when he goes out to drive. He lives alone. He also tells me he has had a previous left great toe amputation We are following him for the right foot wound. 4/26; patient presents for follow-up. He had worsening of his left foot wound and had surgical debridement on 4/16 by Dr. Fanny Skates. He is being followed by podiatry for this issue. He he is on IV Unasyn based on tissue culture by ID.[name] We are following him for the right foot wound. He  has been using Dakin's wet-to-dry dressings without issues. He reports continued usage of the defender boot. 5/3; patient presents for follow-up. He has been using Dakin's wet-to-dry dressings to the right foot wound. He has no issues or complaints today. He reports using the Psychologist, forensic. He reports offloading the foot wound when he is at home by sitting in a recliner. He denies signs of infection. He is still getting IV antibiotics For the left foot wound that is being followed by podiatry. He states the end date is later this month on 5/28. SERGE, MAIN ([id number]) 5/17; patient presents for follow-up. He missed his last clinic appointment. He has been using Dakin's wet-to-dry dressings. He states he is offloading the right foot wound with the defender boot although he does not have this today. He is still on IV antibiotics. He has no issues or complaints today. 5/24; patient presents for follow-up. He has been using Hydrofera Blue to the wound bed without issues. He denies signs of infection. At last clinic visit he  had a wound culture done that detected high levels of Enterococcus faecalis and low levels of Corynebacterium stratum and Staphylococcus epidermidis. [facility] antibiotics were ordered. He has not heard from the company yet. 5/31; patient presents for follow-up. He continues to use Hydrofera Blue to the wound bed. He denies signs of infection. [facility] antibiotics were ordered at last clinic visit and he states he called the company to pay for it. He states that it is being shipped. We rediscussed the total contact cast and he declines having this placed today. He states he is wearing the defender boot however he never wears it into the office. 04-06-2022 upon evaluation today patient appears to be doing about the same in regard to his wound. This is not significantly smaller. He is not wearing his offloading boot. We discussed that today he tells me that he wears it "all the time as  he chuckled and does not have it on during the office visit today. I think it is increasingly obvious that he is just messing around when he says this based on what I am seeing and otherwise I do not know why he would wear it all the time but not to the clinic. Either way my opinion based on what I am seeing currently is simply that the wound does seem to be still having quite a bit of callus buildup around the edges it is also very dry. He does tell me that he has been using the [facility] since he got it and to be honest I think this is good and should hopefully help keep things under control but at the same time I do not see any evidence of active infection. Again the Jodie Echevaria is a topical antibiotic with multiple compounds to help prevent further infection and worsening overall. 6/14; patient presents for follow-up. He has been using Keystone antibiotics with [employer]	[place] daily. He states he is wearing his offloading boot. He does not have this in office today. He is going to the beach for the next 5 days. He states he Will not be using using any offloading device During this time. I again offered the total contact cast however he declined. I did ask him to reconsider for next clinic visit. He currently denies signs of infection. 6/21; patient presents for follow-up. He went to the beach last week. He did not use an offloading device. Wound is slightly deeper today. I again offered the total contact cast but he declined. Currently denies signs of infection. He reports using the Donaldson, hydrofera blue and the Psychologist, forensic. 6/28; patient presents for follow-up. Patient declines total contact cast today. He has been using Keystone and [facility] to the wound bed. It is unclear if he is using the Psychologist, forensic. He would like a letter for the post man to delivers mail to his front door so he does not have to walk down the driveway. 7/5; patient presents for follow-up. He states he is  considering the total contact cast however does not want this placed today. He is actually going to the gym after this appointment. He does not want offloading foot wear for this. He has been using Keystone and [facility] to the wound bed. He denies signs of infection. 7/12; patient presents for follow-up. He is still considering the total contact cast however does not want this today. He has been using Keystone antibiotic and Hydrofera Blue to the wound bed. He denies signs of infection. He has no issues or complaints today.  Objective Constitutional Vitals Time Taken: 3:08 PM, Height: 74 in, Weight: 226 lbs, BMI: 29, Temperature: 98.1 F, Pulse: 76 bpm, Respiratory Rate: 18 breaths/min, Blood Pressure: 132/76 mmHg. General Notes: Right foot: To the plantar aspect of the first metatarsal there is an open wound with granulation tissue. No surrounding signs of infection. Integumentary (Hair, Skin) Wound #1 status is Open. Original cause of wound was Blister. The date acquired was: 07/15/2020. The wound has been in treatment 26 weeks. The wound is located on the Right,Plantar Foot. The wound measures 0.8cm length x 7cm width x 0.3cm depth; 4.398cm^2 area and 1.319cm^3 volume. There is Fat Layer (Subcutaneous Tissue) exposed. There is no tunneling noted, however, there is undermining starting at 12:00 and ending at 12:00 with a maximum distance of 0.3cm. There is a medium amount of serosanguineous drainage noted. The wound margin is thickened. There is large (67-100%) pink granulation within the wound bed. There is a small (1-33%) amount of necrotic tissue within the wound bed including Adherent Slough. Assessment Active Problems ICD-10 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ed Mcilvain, Coye ([id number]) Type 2 diabetes mellitus with foot ulcer Type 2 diabetes mellitus with diabetic neuropathy, unspecified Other abnormalities of gait and  mobility Patient's wound is stable. I recommended continuing to aggressively offload the wound bed. I recommended a cast however he declined this today. Continue with Hydrofera Blue and Keystone antibiotics. Follow-up in 1 week. Plan Follow-up Appointments: Return Appointment in 1 week. Nurse Visit as needed Bathing/ Shower/ Hygiene: May shower; gently cleanse wound with antibacterial soap, rinse and pat dry prior to dressing wounds No tub bath. Anesthetic (Use 'Patient Medications' Section for Anesthetic Order Entry): Lidocaine applied to wound bed Edema Control - Lymphedema / Segmental Compressive Device / Other: Elevate, Exercise Daily and Avoid Standing for Long Periods of Time. Elevate leg(s) parallel to the floor when sitting. DO YOUR BEST to sleep in the bed at night. DO NOT sleep in your recliner. Long hours of sitting in a recliner leads to swelling of the legs and/or potential wounds on your backside. Off-Loading: Foot Defender  - right foot-keep shin covered-keep pressure off of the wound (Patient is not wear boot to clinic each week. Wearing tennis shoe) Additional Orders / Instructions: Follow Nutritious Diet and Increase Protein Intake - monitor blood sugar to maintain normal limits Other: - Follow up post surgery for left foot-[facility] handling Medications-Please add to medication list.: P.O. Antibiotics - apply Keystone cream every other day WOUND #1: - Foot Wound Laterality: Plantar, Right Cleanser: Normal Saline Every Other Day/30 Days Discharge Instructions: Wash your hands with soap and water. Remove old dressing, discard into plastic bag and place into trash. Cleanse the wound with Normal Saline prior to applying a clean dressing using gauze sponges, not tissues or cotton balls. Do not scrub or use excessive force. Pat dry using gauze sponges, not tissue or cotton balls. Cleanser: Soap and Water Every Other Day/30 Days Discharge Instructions: Gently  cleanse wound with antibacterial soap, rinse and pat dry prior to dressing wounds Topical: keystone antibiotic Every Other Day/30 Days Primary Dressing: Hydrofera Blue Ready Transfer Foam, 2.5x2.5 (in/in) Every Other Day/30 Days Discharge Instructions: Apply Hydrofera Blue Ready to wound bed as directed Secondary Dressing: Gauze Every Other Day/30 Days Discharge Instructions: As directed: dry, moistened with saline or moistened with Dakins Solution Secured With: Conform 4'' - Conforming Stretch Gauze Bandage 4x75 (in/in) Every Other Day/30 Days Discharge Instructions: Apply as directed  1. Hydrofera Blue and Keystone antibiotic 2. Aggressive offloading defender boot 3. Follow-up in 1 week Electronic Signature(s) Signed: 05/11/2022 3:56:24 PM By: Geralyn Corwin DO Entered By: Geralyn Corwin on 05/11/2022 15:55:05 [name] ([id number]) -------------------------------------------------------------------------------- ROS/PFSH Details Patient Name: [name] Date of Service: 05/11/2022 3:00 PM Medical Record Number: [id number] Patient Account Number: [id number] Date of Birth/Sex: 11/12/1955 (65 y.o. M) Treating RN: Huel Coventry Primary Care Provider: Manson Allan Other Clinician: Referring Provider: Manson Allan Treating Provider/Extender: Tilda Franco in Treatment: 41 Information Obtained From Patient Endocrine Medical History: Positive for: Type II Diabetes Time with diabetes: 40 years Treated with: Oral agents, Diet Blood sugar tested every day: No Musculoskeletal Medical History: Positive for: Osteoarthritis Immunizations Pneumococcal Vaccine: Received Pneumococcal Vaccination: Yes Received Pneumococcal Vaccination On or After 60th Birthday: Yes Implantable Devices None Family and Social History Former smoker - ended on 10/07/1997; Marital Status - Divorced; Alcohol Use: Never - stopped 4 yrs ago; Drug Use: No History; Caffeine Use: Daily Electronic  Signature(s) Signed: 05/11/2022 3:56:24 PM By: Geralyn Corwin DO Signed: 05/12/2022 8:02:47 AM By: Elliot Gurney, BSN, RN, CWS, Kim RN, BSN Entered By: Geralyn Corwin on 05/11/2022 15:55:45 [name] ([id number]) -------------------------------------------------------------------------------- SuperBill Details Patient Name: [name] Date of Service: 05/11/2022 Medical Record Number: [id number] Patient Account Number: [id number] Date of Birth/Sex: 01-22-1956 (65 y.o. M) Treating RN: Angelina Pih Primary Care Provider: Manson Allan Other Clinician: Referring Provider: Manson Allan Treating Provider/Extender: Tilda Franco in Treatment: 26 Diagnosis Coding ICD-10 Codes Code Description [phone] 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ed E11.621 Type 2 diabetes mellitus with foot ulcer E11.40 Type 2 diabetes mellitus with diabetic neuropathy, unspecified R26.89 Other abnormalities of gait and mobility Facility Procedures CPT4 Code: 41324401 Description: 99213 - WOUND CARE VISIT-LEV 3 EST PT Modifier: Quantity: 1 Physician Procedures CPT4 Code: 0272536 Description: 99213 - WC PHYS LEVEL 3 - EST PT Modifier: Quantity: 1 CPT4 Code: Description: ICD-10 Diagnosis Description L97.512 Non-pressure chronic ulcer of other part of right foot with fat layer e E11.621 Type 2 diabetes mellitus with foot ulcer E11.40 Type 2 diabetes mellitus with diabetic neuropathy, unspecified R26.89 Other  abnormalities of gait and mobility Modifier: xposed Quantity: Electronic Signature(s) Signed: 05/11/2022 3:56:24 PM By: Geralyn Corwin DO Previous Signature: 05/11/2022 3:48:03 PM Version By: Angelina Pih Entered By: Geralyn Corwin on 05/11/2022 15:55:22

## 2022-05-19 NOTE — Discharge Instructions (Signed)

## 2022-05-24 ENCOUNTER — Ambulatory Visit
Admission: RE | Admit: 2022-05-24 | Discharge: 2022-05-24 | Disposition: A | Discharge status: Home / Self Care | Payer: Medicare Other | Attending: Ophthalmology | Admitting: Ophthalmology

## 2022-05-24 ENCOUNTER — Encounter: Payer: Self-pay | Admitting: Ophthalmology

## 2022-05-24 ENCOUNTER — Ambulatory Visit: Payer: Medicare Other | Admitting: Anesthesiology

## 2022-05-24 ENCOUNTER — Other Ambulatory Visit: Payer: Self-pay

## 2022-05-24 ENCOUNTER — Encounter
Admission: RE | Disposition: A | Discharge status: Home / Self Care | Payer: Self-pay | Source: Home / Self Care | Attending: Ophthalmology

## 2022-05-24 DIAGNOSIS — H2512 Age-related nuclear cataract, left eye: Secondary | ICD-10-CM | POA: Insufficient documentation

## 2022-05-24 DIAGNOSIS — E1136 Type 2 diabetes mellitus with diabetic cataract: Secondary | ICD-10-CM | POA: Diagnosis present

## 2022-05-24 DIAGNOSIS — Z87891 Personal history of nicotine dependence: Secondary | ICD-10-CM | POA: Diagnosis not present

## 2022-05-24 DIAGNOSIS — Z8673 Personal history of transient ischemic attack (TIA), and cerebral infarction without residual deficits: Secondary | ICD-10-CM | POA: Insufficient documentation

## 2022-05-24 HISTORY — PX: CATARACT EXTRACTION W/PHACO: SHX586

## 2022-05-24 LAB — GLUCOSE, CAPILLARY
Glucose-Capillary: 227 mg/dL — ABNORMAL HIGH (ref 70–99)
Glucose-Capillary: 236 mg/dL — ABNORMAL HIGH (ref 70–99)

## 2022-05-24 SURGERY — PHACOEMULSIFICATION, CATARACT, WITH IOL INSERTION
Anesthesia: Monitor Anesthesia Care | Site: Eye | Laterality: Left

## 2022-05-24 MED ORDER — FENTANYL CITRATE (PF) 100 MCG/2ML IJ SOLN
INTRAMUSCULAR | Status: DC | PRN
  Administered 2022-05-24: 50 ug via INTRAVENOUS

## 2022-05-24 MED ORDER — MOXIFLOXACIN HCL 0.5 % OP SOLN
OPHTHALMIC | Status: DC | PRN
  Administered 2022-05-24: 0.2 mL via OPHTHALMIC

## 2022-05-24 MED ORDER — SIGHTPATH DOSE#1 BSS IO SOLN
INTRAOCULAR | Status: DC | PRN
  Administered 2022-05-24: 15 mL

## 2022-05-24 MED ORDER — ACETAMINOPHEN 160 MG/5ML PO SOLN: 325.0000 mg | ORAL | Status: DC | PRN

## 2022-05-24 MED ORDER — ONDANSETRON HCL 4 MG/2ML IJ SOLN: 4.0000 mg | Freq: Once | INTRAMUSCULAR | Status: DC | PRN

## 2022-05-24 MED ORDER — SIGHTPATH DOSE#1 BSS IO SOLN
INTRAOCULAR | Status: DC | PRN
  Administered 2022-05-24: 55 mL via OPHTHALMIC

## 2022-05-24 MED ORDER — BRIMONIDINE TARTRATE-TIMOLOL 0.2-0.5 % OP SOLN
OPHTHALMIC | Status: DC | PRN
  Administered 2022-05-24: 1 [drp] via OPHTHALMIC

## 2022-05-24 MED ORDER — ARMC OPHTHALMIC DILATING DROPS
1.0000 | OPHTHALMIC | Status: DC | PRN
  Administered 2022-05-24 (×3): 1 via OPHTHALMIC

## 2022-05-24 MED ORDER — TETRACAINE HCL 0.5 % OP SOLN
1.0000 [drp] | OPHTHALMIC | Status: DC | PRN
  Administered 2022-05-24 (×3): 1 [drp] via OPHTHALMIC

## 2022-05-24 MED ORDER — MIDAZOLAM HCL 2 MG/2ML IJ SOLN
INTRAMUSCULAR | Status: DC | PRN
  Administered 2022-05-24: 2 mg via INTRAVENOUS

## 2022-05-24 MED ORDER — ACETAMINOPHEN 325 MG PO TABS: 325.0000 mg | ORAL_TABLET | ORAL | Status: DC | PRN

## 2022-05-24 MED ORDER — SIGHTPATH DOSE#1 NA CHONDROIT SULF-NA HYALURON 40-17 MG/ML IO SOLN
INTRAOCULAR | Status: DC | PRN
  Administered 2022-05-24: 1 mL via INTRAOCULAR

## 2022-05-24 MED ORDER — SIGHTPATH DOSE#1 BSS IO SOLN
INTRAOCULAR | Status: DC | PRN
  Administered 2022-05-24: 1 mL via INTRAMUSCULAR

## 2022-05-24 SURGICAL SUPPLY — 10 items
CATARACT SUITE SIGHTPATH (MISCELLANEOUS) ×2 IMPLANT
FEE CATARACT SUITE SIGHTPATH (MISCELLANEOUS) ×1 IMPLANT
GLOVE SURG ENC TEXT LTX SZ8 (GLOVE) ×2 IMPLANT
GLOVE SURG TRIUMPH 8.0 PF LTX (GLOVE) ×2 IMPLANT
LENS IOL TECNIS EYHANCE 17.0 (Intraocular Lens) ×1 IMPLANT
NDL FILTER BLUNT 18X1 1/2 (NEEDLE) ×1 IMPLANT
NEEDLE FILTER BLUNT 18X 1/2SAF (NEEDLE) ×1
NEEDLE FILTER BLUNT 18X1 1/2 (NEEDLE) ×1 IMPLANT
SYR 3ML LL SCALE MARK (SYRINGE) ×2 IMPLANT
WATER STERILE IRR 250ML POUR (IV SOLUTION) ×2 IMPLANT

## 2022-05-24 NOTE — Transfer of Care (Signed)
Immediate Anesthesia Transfer of Care Note  Patient: [name]  Procedure(s) Performed: CATARACT EXTRACTION PHACO AND INTRAOCULAR LENS PLACEMENT (IOC) LEFT 7.63 00:42.1 (Left: Eye)  Patient Location: PACU  Anesthesia Type: MAC  Level of Consciousness: awake, alert  and patient cooperative  Airway and Oxygen Therapy: Patient Spontanous Breathing and Patient connected to supplemental oxygen  Post-op Assessment: Post-op Vital signs reviewed, Patient's Cardiovascular Status Stable, Respiratory Function Stable, Patent Airway and No signs of Nausea or vomiting  Post-op Vital Signs: Reviewed and stable  Complications: No notable events documented.

## 2022-05-24 NOTE — H&P (Signed)
[facility]   Primary Care Physician:  Alm Bustard, NP Ophthalmologist: Dr. Druscilla Brownie  Pre-Procedure History & Physical: HPI:  [name] is a 66 y.o. male here for cataract surgery.   Past Medical History:  Diagnosis Date   Diabetes mellitus without complication (HCC)    Gout     Past Surgical History:  Procedure Laterality Date   AMPUTATION TOE Left 12/16/2021   Procedure: AMPUTATION TOE - Left Great;  Surgeon: Linus Galas, DPM;  Location: [facility] ORS;  Service: Podiatry;  Laterality: Left;   AMPUTATION TOE Left 01/14/2022   Procedure: GREAT TOE AMPUTATION;  Surgeon: Linus Galas, DPM;  Location: [facility] ORS;  Service: Podiatry;  Laterality: Left;   LAPAROSCOPIC GASTRIC BANDING      Prior to Admission medications   Medication Sig Start Date End Date Taking? Authorizing Provider  allopurinol (ZYLOPRIM) 300 MG tablet Take 300 mg by mouth 2 (two) times daily.   Yes [provider]  atorvastatin (LIPITOR) 80 MG tablet Take 1 tablet (80 mg total) by mouth daily. 12/07/21 05/24/22 Yes Darlin Priestly, MD  buPROPion (WELLBUTRIN XL) 150 MG 24 hr tablet Take 150 mg by mouth daily.   Yes [provider]  gabapentin (NEURONTIN) 300 MG capsule Take 3 capsules (900 mg total) by mouth 3 (three) times daily. 12/17/21  Yes Rodolph Bong, MD  polyethylene glycol (MIRALAX / GLYCOLAX) 17 g packet Take 17 g by mouth daily as needed for moderate constipation. 01/16/22  Yes Osvaldo Shipper, MD  Semaglutide 7 MG TABS Take 14 mg by mouth daily.   Yes [provider]  testosterone cypionate (DEPOTESTOSTERONE CYPIONATE) 200 MG/ML injection Inject into the muscle every 14 (fourteen) days.   Yes [provider]  HYDROcodone-acetaminophen (NORCO/VICODIN) 5-325 MG tablet Take 1-2 tablets by mouth every 6 (six) hours as needed for severe pain. Patient not taking: Reported on 05/12/2022 01/16/22   Osvaldo Shipper, MD  lisinopril (ZESTRIL) 10 MG tablet Take 0.5 tablets (5 mg  total) by mouth daily. Patient not taking: Reported on 05/12/2022 12/20/21   Rodolph Bong, MD    Allergies as of 04/08/2022   (No Known Allergies)    History reviewed. No pertinent family history.  Social History   Socioeconomic History   Marital status: Single    Spouse name: Not on file   Number of children: Not on file   Years of education: Not on file   Highest education level: Not on file  Occupational History   Not on file  Tobacco Use   Smoking status: Former    Types: Cigarettes    Quit date: 10/07/1997    Years since quitting: 24.6   Smokeless tobacco: Never  Vaping Use   Vaping Use: Never used  Substance and Sexual Activity   Alcohol use: Never   Drug use: Never   Sexual activity: Not on file  Other Topics Concern   Not on file  Social History Narrative   Not on file   Social Determinants of Health   Financial Resource Strain: Not on file  Food Insecurity: Not on file  Transportation Needs: Not on file  Physical Activity: Not on file  Stress: Not on file  Social Connections: Not on file  Intimate Partner Violence: Not on file    Review of Systems: See HPI, otherwise negative ROS  Physical Exam: BP 127/73   Pulse 77   Temp (!) 97.2 F (36.2 C) (Temporal)   Resp 17   Ht 6\' 2"  (  1.88 m)   Wt 103.4 kg   SpO2 99%   BMI 29.27 kg/m  General:   Alert, cooperative in NAD Head:  Normocephalic and atraumatic. Respiratory:  Normal work of breathing. Cardiovascular:  RRR  Impression/Plan: [name] is here for cataract surgery.  Risks, benefits, limitations, and alternatives regarding cataract surgery have been reviewed with the patient.  Questions have been answered.  All parties agreeable.   Galen Manila, MD  05/24/2022, 7:15 AM

## 2022-05-24 NOTE — Op Note (Signed)
PREOPERATIVE DIAGNOSIS:  Nuclear sclerotic cataract of the left eye.   POSTOPERATIVE DIAGNOSIS:  Nuclear sclerotic cataract of the left eye.   OPERATIVE PROCEDURE:ORPROCALL@   SURGEON:  Galen Manila, MD.   ANESTHESIA:  Anesthesiologist: Jola Babinski, MD CRNA: Omer Jack, CRNA  1.      Managed anesthesia care. 2.     0.25ml of Shugarcaine was instilled following the paracentesis   COMPLICATIONS:  None.   TECHNIQUE:   Stop and chop   DESCRIPTION OF PROCEDURE:  The patient was examined and consented in the preoperative holding area where the aforementioned topical anesthesia was applied to the left eye and then brought back to the Operating Room where the left eye was prepped and draped in the usual sterile ophthalmic fashion and a lid speculum was placed. A paracentesis was created with the side port blade and the anterior chamber was filled with viscoelastic. A near clear corneal incision was performed with the steel keratome. A continuous curvilinear capsulorrhexis was performed with a cystotome followed by the capsulorrhexis forceps. Hydrodissection and hydrodelineation were carried out with BSS on a blunt cannula. The lens was removed in a stop and chop  technique and the remaining cortical material was removed with the irrigation-aspiration handpiece. The capsular bag was inflated with viscoelastic and the Technis [id number] lens was placed in the capsular bag without complication. The remaining viscoelastic was removed from the eye with the irrigation-aspiration handpiece. The wounds were hydrated. The anterior chamber was flushed with BSS and the eye was inflated to physiologic pressure. 0.19ml Vigamox was placed in the anterior chamber. The wounds were found to be water tight. The eye was dressed with Combigan. The patient was given protective glasses to wear throughout the day and a shield with which to sleep tonight. The patient was also given drops with which to begin a drop regimen  today and will follow-up with me in one day. Implant Name Type Inv. Item Serial No. Manufacturer Lot No. LRB No. Used Action  LENS IOL TECNIS EYHANCE 17.0 - [id number] Intraocular Lens LENS IOL TECNIS EYHANCE 17.0 [id number] [employer]  Left 1 Implanted    Procedure(s) with comments: CATARACT EXTRACTION PHACO AND INTRAOCULAR LENS PLACEMENT (IOC) LEFT 7.63 00:42.1 (Left) - Diabetic  Electronically signed: Galen Manila 05/24/2022 7:52 AM

## 2022-05-24 NOTE — Anesthesia Postprocedure Evaluation (Signed)
Anesthesia Post Note  Patient: [name]  Procedure(s) Performed: CATARACT EXTRACTION PHACO AND INTRAOCULAR LENS PLACEMENT (IOC) LEFT 7.63 00:42.1 (Left: Eye)     Patient location during evaluation: PACU Anesthesia Type: MAC Level of consciousness: awake Pain management: pain level controlled Vital Signs Assessment: post-procedure vital signs reviewed and stable Respiratory status: respiratory function stable Cardiovascular status: stable Postop Assessment: no apparent nausea or vomiting Anesthetic complications: no   No notable events documented.  Veda Canning

## 2022-05-24 NOTE — Anesthesia Preprocedure Evaluation (Signed)
Anesthesia Evaluation  Patient identified by MRN, date of birth, ID band  Reviewed: Allergy & Precautions, NPO status   Airway Mallampati: II  TM Distance: >3 FB     Dental   Pulmonary former smoker,    Pulmonary exam normal        Cardiovascular  Rhythm:Regular Rate:Normal     Neuro/Psych PSYCHIATRIC DISORDERS Depression CVA    GI/Hepatic   Endo/Other  diabetes, Type 2  Renal/GU Renal InsufficiencyRenal disease     Musculoskeletal   Abdominal   Peds  Hematology   Anesthesia Other Findings   Reproductive/Obstetrics                             Anesthesia Physical Anesthesia Plan  ASA: 2  Anesthesia Plan: MAC   Post-op Pain Management: Minimal or no pain anticipated   Induction: Intravenous  PONV Risk Score and Plan: 1 and TIVA, Midazolam and Treatment may vary due to age or medical condition  Airway Management Planned: Natural Airway and Nasal Cannula  Additional Equipment:   Intra-op Plan:   Post-operative Plan:   Informed Consent: I have reviewed the patients History and Physical, chart, labs and discussed the procedure including the risks, benefits and alternatives for the proposed anesthesia with the patient or authorized representative who has indicated his/her understanding and acceptance.       Plan Discussed with: CRNA  Anesthesia Plan Comments:         Anesthesia Quick Evaluation

## 2022-05-25 ENCOUNTER — Encounter (HOSPITAL_BASED_OUTPATIENT_CLINIC_OR_DEPARTMENT_OTHER): Payer: Medicare Other | Admitting: Internal Medicine

## 2022-05-25 ENCOUNTER — Encounter: Payer: Self-pay | Admitting: Ophthalmology

## 2022-05-25 DIAGNOSIS — E11621 Type 2 diabetes mellitus with foot ulcer: Secondary | ICD-10-CM | POA: Diagnosis not present

## 2022-05-25 DIAGNOSIS — L97512 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ed: Secondary | ICD-10-CM | POA: Diagnosis not present

## 2022-05-26 ENCOUNTER — Ambulatory Visit (INDEPENDENT_AMBULATORY_CARE_PROVIDER_SITE_OTHER): Payer: Medicare Other | Admitting: Podiatry

## 2022-05-26 DIAGNOSIS — M2042 Other hammer toe(s) (acquired), left foot: Secondary | ICD-10-CM | POA: Diagnosis not present

## 2022-05-26 DIAGNOSIS — M2041 Other hammer toe(s) (acquired), right foot: Secondary | ICD-10-CM

## 2022-05-26 DIAGNOSIS — E119 Type 2 diabetes mellitus without complications: Secondary | ICD-10-CM

## 2022-05-26 NOTE — Progress Notes (Signed)
[name] ([id number]) Visit Report for 05/25/2022 Arrival Information Details Patient Name: [name], [name] Date of Service: 05/25/2022 8:15 AM Medical Record Number: [id number] Patient Account Number: [id number] Date of Birth/Sex: [birth year] (65 y.o. M) Treating RN: [name] Primary Care Oron Westrup: [name] [name] Clinician: Massie [name] Referring [name]: [name] Treating [name]/Extender: [name] in Treatment: 28 Visit Information History Since Last Visit All ordered tests and consults were completed: No Patient Arrived: Ambulatory Added or deleted any medications: No Arrival Time: 08:05 Any new allergies or adverse reactions: No Transfer Assistance: None Had a fall or experienced change in No Patient Requires Transmission-Based No activities of daily living that may affect Precautions: risk of falls: Patient Has Alerts: Yes Hospitalized since last visit: No Patient Alerts: Patient on Blood Pain Present Now: Yes Thinner DIABETIC Plavix Electronic Signature(s) Signed: 05/25/2022 8:24:32 AM By: [name] Entered By: [name] on 05/25/2022 08:09:59 [name] ([id number]) -------------------------------------------------------------------------------- Clinic Level of Care Assessment Details Patient Name: [name] Date of Service: 05/25/2022 8:15 AM Medical Record Number: [id number] Patient Account Number: [id number] Date of Birth/Sex: [birth year] (65 y.o. M) Treating RN: [name] Primary Care [name]: [name] [name] Clinician: Massie [name] Referring [name]: [name] Treating [name]/Extender: [name] in Treatment: 28 Clinic Level of Care Assessment Items TOOL 1 Quantity Score [] - Use when EandM and Procedure is performed on INITIAL visit 0 ASSESSMENTS - Nursing Assessment / Reassessment [] - General Physical Exam (combine w/ comprehensive assessment (listed just below) when  performed on new 0 pt. evals) [] - 0 Comprehensive Assessment (HX, ROS, Risk Assessments, Wounds Hx, etc.) ASSESSMENTS - Wound and Skin Assessment / Reassessment [] - Dermatologic / Skin Assessment (not related to wound area) 0 ASSESSMENTS - Ostomy and/or Continence Assessment and Care [] - Incontinence Assessment and Management 0 [] - 0 Ostomy Care Assessment and Management (repouching, etc.) PROCESS - Coordination of Care [] - Simple Patient / Family Education for ongoing care 0 [] - 0 Complex (extensive) Patient / Family Education for ongoing care [] - 0 Staff obtains Consents, Records, Test Results / Process Orders [] - 0 Staff telephones HHA, Nursing Homes / Clarify orders / etc [] - 0 Routine Transfer to another Facility (non-emergent condition) [] - 0 Routine Hospital Admission (non-emergent condition) [] - 0 New Admissions / Biomedical engineer / Ordering NPWT, Apligraf, etc. [] - 0 Emergency Hospital Admission (emergent condition) PROCESS - Special Needs [] - Pediatric / Minor Patient Management 0 [] - 0 Isolation Patient Management [] - 0 Hearing / Language / Visual special needs [] - 0 Assessment of Community assistance (transportation, D/C planning, etc.) [] - 0 Additional assistance / Altered mentation [] - 0 Support Surface(s) Assessment (bed, cushion, seat, etc.) INTERVENTIONS - Miscellaneous [] - External ear exam 0 [] - 0 Patient Transfer (multiple staff / Civil Service fast streamer / Similar devices) [] - 0 Simple Staple / Suture removal (25 or less) [] - 0 Complex Staple / Suture removal (26 or more) [] - 0 Hypo/Hyperglycemic Management (do not check if billed separately) [] - 0 Ankle / Brachial Index (ABI) - do not check if billed separately Has the patient been seen at the hospital within the last three years: Yes Total Score: 0 Level Of Care: ____ [name] ([id number]) Electronic Signature(s) Signed: 05/26/2022 4:20:19 PM By: [name] Entered By: [name] on 05/25/2022 08:57:06 [name] ([id number]) -------------------------------------------------------------------------------- Encounter Discharge Information Details Patient Name: [name] Date of Service: 05/25/2022 8:15 AM  Medical Record Number: [id number] Patient Account Number: [id number] Date of Birth/Sex: 06/30/1956 (65 y.o. M) Treating RN: [name] Primary Care : Fields, [name] Clinician: Venable, [name] Referring : [name] Treating /Extender: [name] in Treatment: 28 Encounter Discharge Information Items Post Procedure Vitals Discharge Condition: Stable Temperature (F): 97.6 Ambulatory Status: Ambulatory Pulse (bpm): 70 Discharge Destination: Home Respiratory Rate (breaths/min): 16 Transportation: Private Auto Blood Pressure (mmHg): 133/73 Accompanied By: self Schedule Follow-up Appointment: Yes Clinical Summary of Care: Electronic Signature(s) Signed: 05/26/2022 4:20:19 PM By: [name] Entered By: [name] on 05/25/2022 09:05:49 [name], [name] ([id number]) -------------------------------------------------------------------------------- Lower Extremity Assessment Details Patient Name: [name], [name] Date of Service: 05/25/2022 8:15 AM Medical Record Number: [id number] Patient Account Number: [id number] Date of Birth/Sex: 09/20/1956 (65 y.o. M) Treating RN: [name] Primary Care : Fields, [name] Clinician: Venable, [name] Referring : [name] Treating /Extender: [name] in Treatment: 28 Electronic Signature(s) Signed: 05/25/2022 8:24:32 AM By: [name] Signed: 05/25/2022 4:29:23 PM By: Woody, BSN, RN, CWS, Kim RN, BSN Entered By: [name] on 05/25/2022 08:21:26 [name], [name] ([id number]) -------------------------------------------------------------------------------- Multi Wound Chart  Details Patient Name: [name] Date of Service: 05/25/2022 8:15 AM Medical Record Number: [id number] Patient Account Number: [id number] Date of Birth/Sex: 11/11/1955 (65 y.o. M) Treating RN: [name] Primary Care : Fields, [name] Clinician: Venable, [name] Referring : [name] Treating /Extender: [name] in Treatment: 28 Vital Signs Height(in): 74 Pulse(bpm): 70 Weight(lbs): 226 Blood Pressure(mmHg): 133/73 Body Mass Index(BMI): 29 Temperature(°F): 97.6 Respiratory Rate(breaths/min): 16 Photos: [N/A:N/A] Wound Location: Right, Plantar Foot N/A N/A Wounding Event: Blister N/A N/A Primary Etiology: Diabetic Wound/Ulcer of the Lower N/A N/A Extremity Comorbid History: Type II Diabetes, Osteoarthritis N/A N/A Date Acquired: 07/15/2020 N/A N/A [name] of Treatment: 28 N/A N/A Wound Status: Open N/A N/A Wound Recurrence: No N/A N/A Measurements L x W x D (cm) 0.5x0.7x0.4 N/A N/A Area (cm) : 0.275 N/A N/A Volume (cm) : 0.11 N/A N/A % Reduction in Area: 75.50% N/A N/A % Reduction in Volume: 83.70% N/A N/A Classification: Grade 2 N/A N/A Exudate Amount: Medium N/A N/A Exudate Type: Serosanguineous N/A N/A Exudate Color: red, brown N/A N/A Wound Margin: Thickened N/A N/A Granulation Amount: Large (67-100%) N/A N/A Granulation Quality: Pink N/A N/A Necrotic Amount: Small (1-33%) N/A N/A Exposed Structures: Fat Layer (Subcutaneous Tissue): N/A N/A Yes Fascia: No Tendon: No Muscle: No Joint: No Bone: No Epithelialization: None N/A N/A Treatment Notes Electronic Signature(s) Signed: 05/25/2022 8:24:32 AM By: [name] Entered By: [name] on 05/25/2022 08:21:47 [name], [name] ([id number]) -------------------------------------------------------------------------------- Multi-Disciplinary Care Plan Details Patient Name: Jerkins, Epimenio Date of Service: 05/25/2022 8:15 AM Medical Record Number:  [id number] Patient Account Number: [id number] Date of Birth/Sex: 02/19/1956 (65 y.o. M) Treating RN: [name] Primary Care : Fields, [name] Clinician: Venable, [name] Referring : [name] Treating /Extender: [name] in Treatment: 28 Active Inactive Wound/Skin Impairment Nursing Diagnoses: Impaired tissue integrity Knowledge deficit related to smoking impact on wound healing Knowledge deficit related to ulceration/compromised skin integrity Goals: Patient/caregiver will verbalize understanding of skin care regimen Date Initiated: 11/10/2021 Date Inactivated: 12/01/2021 Target Resolution Date: 11/17/2021 Goal Status: Met Ulcer/skin breakdown will have a volume reduction of 30% by week 4 Date Initiated: 11/10/2021 Date Inactivated: 02/02/2022 Target Resolution Date: 12/08/2021 Goal Status: Met Ulcer/skin breakdown will have a volume reduction of 50% by week 8 Date Initiated: 11/10/2021 Target Resolution Date: 01/05/2022 Goal Status: Active Ulcer/skin breakdown will have a volume reduction of 80%   by week 12 Date Initiated: 11/10/2021 Target Resolution Date: 02/02/2022 Goal Status: Active Ulcer/skin breakdown will heal within 14 [name] Date Initiated: 11/10/2021 Target Resolution Date: 02/16/2022 Goal Status: Active Interventions: Assess patient/caregiver ability to obtain necessary supplies Assess patient/caregiver ability to perform ulcer/skin care regimen upon admission and as needed Assess ulceration(s) every visit Notes: Electronic Signature(s) Signed: 05/25/2022 8:24:32 AM By: [name] Signed: 05/25/2022 4:29:23 PM By: Woody, BSN, RN, CWS, Kim RN, BSN Entered By: [name] on 05/25/2022 08:21:31 [name], [name] ([id number]) -------------------------------------------------------------------------------- Pain Assessment Details Patient Name: [name], [name] Date of Service: 05/25/2022 8:15 AM Medical Record Number:  [id number] Patient Account Number: [id number] Date of Birth/Sex: 09/19/1956 (65 y.o. M) Treating RN: [name] Primary Care : Fields, [name] Clinician: Venable, [name] Referring : [name] Treating /Extender: [name] in Treatment: 28 Active Problems Location of Pain Severity and Description of Pain Patient Has Paino Yes Site Locations Pain Location: Pain in Ulcers Duration of the Pain. Constant / Intermittento Constant Rate the pain. Current Pain Level: 5 Character of Pain Describe the Pain: Aching, Throbbing Pain Management and Medication Current Pain Management: Medication: Yes Rest: Yes Electronic Signature(s) Signed: 05/25/2022 8:24:32 AM By: [name] Signed: 05/25/2022 4:29:23 PM By: Woody, BSN, RN, CWS, Kim RN, BSN Entered By: [name] on 05/25/2022 08:14:41 [name], [name] ([id number]) -------------------------------------------------------------------------------- Patient/Caregiver Education Details Patient Name: [name], [name] Date of Service: 05/25/2022 8:15 AM Medical Record Number: [id number] Patient Account Number: [id number] Date of Birth/Gender: 04/05/1956 (65 y.o. M) Treating RN: [name] Primary Care Physician: Fields, [name] Clinician: Venable, [name] Referring Physician: Fields, [name] Treating Physician/Extender: [name] in Treatment: 28 Education Assessment Education Provided To: Patient Education Topics Provided Pressure: Handouts: Pressure Ulcers: Care and Offloading Electronic Signature(s) Signed: 05/26/2022 4:20:19 PM By: [name] Entered By: [name] on 05/25/2022 08:57:29 [name], [name] ([id number]) -------------------------------------------------------------------------------- Wound Assessment Details Patient Name: [name], [name] Date of Service: 05/25/2022 8:15 AM Medical Record Number: [id number] Patient Account Number: [id number] Date of  Birth/Sex: 11/15/1955 (65 y.o. M) Treating RN: [name] Primary Care : Fields, [name] Clinician: Venable, [name] Referring : [name] Treating /Extender: [name] in Treatment: 28 Wound Status Wound Number: 1 Primary Etiology: Diabetic Wound/Ulcer of the Lower Extremity Wound Location: Right, Plantar Foot Wound Status: Open Wounding Event: Blister Comorbid History: Type II Diabetes, Osteoarthritis Date Acquired: 07/15/2020 [name] Of Treatment: 28 Clustered Wound: No Photos Wound Measurements Length: (cm) 0.5 Width: (cm) 0.7 Depth: (cm) 0.4 Area: (cm) 0.275 Volume: (cm) 0.11 % Reduction in Area: 75.5% % Reduction in Volume: 83.7% Epithelialization: None Wound Description Classification: Grade 2 Wound Margin: Thickened Exudate Amount: Medium Exudate Type: Serosanguineous Exudate Color: red, brown Foul Odor After Cleansing: No Slough/Fibrino Yes Wound Bed Granulation Amount: Large (67-100%) Exposed Structure Granulation Quality: Pink Fascia Exposed: No Necrotic Amount: Small (1-33%) Fat Layer (Subcutaneous Tissue) Exposed: Yes Necrotic Quality: Adherent Slough Tendon Exposed: No Muscle Exposed: No Joint Exposed: No Bone Exposed: No Treatment Notes Wound #1 (Foot) Wound Laterality: Plantar, Right Cleanser Normal Saline Discharge Instruction: Wash your hands with soap and water. Remove old dressing, discard into plastic bag and place into trash. Cleanse the wound with Normal Saline prior to applying a clean dressing using gauze sponges, not tissues or cotton balls. Do not scrub or use excessive force. Pat dry using gauze sponges, not tissue or cotton balls. Sullenger, Oneill ([id number]) Soap and Water Discharge Instruction: Gently cleanse wound with antibacterial soap, rinse and pat dry prior to dressing wounds Peri-Wound Care   Topical Primary Dressing Hydrofera Blue Ready Transfer Foam, 2.5x2.5 (in/in) Discharge  Instruction: Apply Hydrofera Blue Ready to wound bed as directed Secondary Dressing Gauze Discharge Instruction: As directed: dry, moistened with saline or moistened with Dakins Solution Secured With Conform 4'' - Conforming Stretch Gauze Bandage 4x75 (in/in) Discharge Instruction: Apply as directed Compression Wrap Compression Stockings Add-Ons Electronic Signature(s) Signed: 05/25/2022 8:24:32 AM By: [name] Signed: 05/25/2022 4:29:23 PM By: Woody, BSN, RN, CWS, Kim RN, BSN Entered By: [name] on 05/25/2022 08:21:12 [name], [name] ([id number]) -------------------------------------------------------------------------------- Vitals Details Patient Name: [name], [name] Date of Service: 05/25/2022 8:15 AM Medical Record Number: [id number] Patient Account Number: [id number] Date of Birth/Sex: 07/06/1956 (65 y.o. M) Treating RN: [name] Primary Care : Fields, [name] Clinician: Venable, [name] Referring : [name] Treating /Extender: [name] in Treatment: 28 Vital Signs Time Taken: 08:10 Temperature (°F): 97.6 Height (in): 74 Pulse (bpm): 70 Weight (lbs): 226 Respiratory Rate (breaths/min): 16 Body Mass Index (BMI): 29 Blood Pressure (mmHg): 133/73 Reference Range: 80 - 120 mg / dl Electronic Signature(s) Signed: 05/25/2022 8:24:32 AM By: [name] Entered By: [name] on 05/25/2022 08:14:31 

## 2022-05-26 NOTE — Progress Notes (Signed)
[name], [name] ([id number]) Visit Report for 05/25/2022 Chief Complaint Document Details Patient Name: [name], [name] Date of Service: 05/25/2022 8:15 AM Medical Record Number: [id number] Patient Account Number: [id number] Date of Birth/Sex: Aug 01, 1956 (65 y.o. M) Treating RN: Huel Coventry Primary Care Provider: Manson Allan Other Clinician: Betha Loa Referring Provider: Manson Allan Treating Provider/Extender: Tilda Franco in Treatment: 28 Information Obtained from: Patient Chief Complaint Right plantar foot wound Electronic Signature(s) Signed: 05/25/2022 10:41:29 AM By: Geralyn Corwin DO Entered By: Geralyn Corwin on 05/25/2022 09:25:53 [name] ([id number]) -------------------------------------------------------------------------------- Debridement Details Patient Name: [name] Date of Service: 05/25/2022 8:15 AM Medical Record Number: [id number] Patient Account Number: [id number] Date of Birth/Sex: 1956-01-13 (65 y.o. M) Treating RN: Huel Coventry Primary Care Provider: Manson Allan Other Clinician: Betha Loa Referring Provider: Manson Allan Treating Provider/Extender: Tilda Franco in Treatment: 28 Debridement Performed for Wound #1 Right,Plantar Foot Assessment: Performed By: Physician Geralyn Corwin, MD Debridement Type: Debridement Severity of Tissue Pre Debridement: Fat layer exposed Level of Consciousness (Pre- Awake and Alert procedure): Pre-procedure Verification/Time Out Yes - 08:53 Taken: Start Time: 08:53 Total Area Debrided (L x W): 1.5 (cm) x 1.5 (cm) = 2.25 (cm) Tissue and other material Non-Viable, Callus, Subcutaneous debrided: Level: Skin/Subcutaneous Tissue Debridement Description: Excisional Instrument: Curette Bleeding: Minimum Hemostasis Achieved: Pressure End Time: 08:57 Response to Treatment: Procedure was tolerated well Level of Consciousness (Post- Awake and Alert procedure): Post  Debridement Measurements of Total Wound Length: (cm) 0.6 Width: (cm) 0.8 Depth: (cm) 0.5 Volume: (cm) 0.188 Character of Wound/Ulcer Post Debridement: Stable Severity of Tissue Post Debridement: Fat layer exposed Post Procedure Diagnosis Same as Pre-procedure Electronic Signature(s) Signed: 05/25/2022 10:41:29 AM By: Geralyn Corwin DO Signed: 05/25/2022 4:29:23 PM By: Elliot Gurney, BSN, RN, CWS, Kim RN, BSN Signed: 05/26/2022 4:20:19 PM By: Betha Loa Entered By: Betha Loa on 05/25/2022 08:56:38 [name] ([id number]) -------------------------------------------------------------------------------- HPI Details Patient Name: [name] Date of Service: 05/25/2022 8:15 AM Medical Record Number: [id number] Patient Account Number: [id number] Date of Birth/Sex: Nov 25, 1955 (65 y.o. M) Treating RN: Huel Coventry Primary Care Provider: Manson Allan Other Clinician: Betha Loa Referring Provider: Manson Allan Treating Provider/Extender: Tilda Franco in Treatment: 76 History of Present Illness HPI Description: Admission 11/10/2021 [name] is a 66 year old male with a past medical history of uncontrolled type 2 diabetes with last hemoglobin A1c of 8.1 complicated by peripheral neuropathy that presents to the clinic for a 2-year history of nonhealing ulcer to the bottom of his right foot. He states this started out as a blister caused by a work boot. He reports receiving wound care when he resided in [place]. He is reestablishing his wound care in [facility] today. He is currently keeping the area clean and covered. He has insoles designed to help offload the wound bed. He currently denies signs of infection. 1/18; patient presents for follow-up. He has been using Hydrofera Blue to the wound bed. He has no issues or complaints today. He has not received the defender.[name] He denies signs of infection. 2/1; patient presents for follow-up. He has been  using Hydrofera Blue to the wound bed he states he received the defender boot and has been using it however he did not have it on today. He currently denies signs of infection to the right foot. Unfortunately he developed a wound to the left great toe over the past week. He states he received new orthotics and these caused a blister to the left great toe which turned into a wound. He has  not been doing anything to the wound bed. He currently denies systemic signs of infection. 2/8; Patient presents for follow-up. 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 He states he is about 90% recovered. He has some mild weakness to the left side. He reports taking the antibiotics prescribed at last clinic visit. He reports using Hydrofera Blue for dressing changes. He only uses the offloading boot when he is at home. He uses open toed shoes to the right foot. He currently denies signs of infection. 2/15; patient presents for follow-up. He states he is still taking the antibiotics prescribed. He reports using the defender boot to the right foot and open toed shoe to the left foot however he is not wearing either today. He currently denies systemic signs of infection. He stated he cut down a tree last week and was outside doing yard work. 2/22; patient presents for follow-up. 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 He reports feeling well. He reports using the defender boot to the right leg and continues to use Hydrofera Blue with dressing changes. He denies systemic signs of infection. 3/8; patient presents for follow-up. He sees Dr. Alberteen Spindle tomorrow for follow-up of the left great toe amputation. This was a partial amputation. He reports he is taking Augmentin currently. He reports increased erythema to the left great toe amputation site. He also reports a decline in  his right plantar foot wound. He reports it is draining more. He denies purulent drainage. 3/15; patient presents for follow-up. He did not see Dr. Alberteen Spindle last week but states he sees him today. He states he has been taking doxycycline in the past week and started gentamicin ointment. He continues to use [facility] with dressing changes. He currently denies systemic signs of infection. 3/22; patient presents for follow-up. Patient had partial amputation of the left great toe on 2/15. 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 Alberteen Spindle. He reports no issues and has no complaints. He is currently taking Keflex and doxycycline prescribed by Dr. Alberteen Spindle. He continues to use gentamicin ointment and Hydrofera Blue to the right foot wound. He reports using the defender boot when he is at home. 3/29; patient presents for follow-up. He continues to be on antibiotics per podiatry. He has been using Hydrofera Blue and gentamicin ointment to the right plantar foot wound. He has no issues or complaints today. He denies signs of infection. 4/5; patient presents for follow-up. He has been using Dakin's wet-to-dry dressings with improvement to wound healing. He denies signs of infection. She has no issues or complaints today. 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 Use Hydrofera Blue for a prolonged period of time and has been using Dakin's wet- to-dry I think for the last 2 to 3 weeks. He has a Psychologist, forensic at home but he does not wear that coming into the clinic because he drives. He tells me he has been compliant with the defender boot short of when he goes out to drive. He lives alone. He also tells me he has had a previous left great toe amputation We are following him for the right foot wound. 4/26; patient presents for follow-up. He had worsening of his left foot wound and had surgical debridement on 4/16 by Dr. Fanny Skates. He is being followed by podiatry  for this issue. He he is on IV Unasyn based on tissue culture by ID.[name]. We are following him for the right foot wound. He has been using Dakin's wet-to-dry dressings without issues. He reports continued usage of the defender boot. 5/3; patient presents for follow-up. He has been using Dakin's wet-to-dry dressings to the right foot wound. He has no issues or complaints today. He reports using the Psychologist, forensicdefender boot. He reports offloading the foot wound when he is at home by sitting in a recliner. He denies signs of infection. He is still getting IV antibiotics For the left foot wound that is being followed by podiatry. He states the end date is later this month on 5/28. 5/17; patient presents for follow-up. He missed his last clinic appointment. He has been using Dakin's wet-to-dry dressings. He states he is offloading the right foot wound with the defender boot although he does not have this today. He is still on IV antibiotics. He has no issues or complaints today. 5/24; patient presents for follow-up. He has been using Hydrofera Blue to the wound bed without issues. He denies signs of infection. [facility][id number][facility] he had a wound culture done that detected high levels of Enterococcus faecalis and low levels of Corynebacterium stratum and Staphylococcus epidermidis. Keystone antibiotics were ordered. He has not heard from the company yet. 5/31; patient presents for follow-up. He continues to use Hydrofera Blue to the wound bed. He denies signs of infection. Keystone antibiotics were ordered at last clinic visit and he states he called the company to pay for it. He states that it is being shipped. We rediscussed the total contact cast and he declines having this placed today. He states he is wearing the defender boot however he never wears it into the office. 04-06-2022 upon evaluation today patient appears to be doing about the same in regard to his wound. This is not significantly  smaller. He is not wearing his offloading boot. We discussed that today he tells me that he wears it "all the time as he chuckled and does not have it on during the office visit today. I think it is increasingly obvious that he is just messing around when he says this based on what I am seeing and otherwise I do not know why he would wear it all the time but not to the clinic. Either way my opinion based on what I am seeing currently is simply that the wound does seem to be still having quite a bit of callus buildup around the edges it is also very dry. He does tell me that he has been [facility] got [facility] and to be honest I think this is good and should hopefully help keep things under control but at the same time I do not see any evidence of active infection. Again the [facility] is a topical antibiotic with multiple compounds to help prevent further infection and worsening overall. 6/14; patient presents for follow-up. He has been using Keystone antibiotics with [employer]	[place]. He states he is wearing his offloading boot. He does not have this in office today. He is going to the beach for the next 5 days. He states he Will not be using using any offloading device During this time. I again offered the total contact cast however he declined. I did ask him to reconsider for next clinic visit. He currently denies signs of infection. 6/21; patient presents for follow-up. He went to the beach  last week. He did not use an offloading device. Wound is slightly deeper today. I again offered the total contact cast but he declined. Currently denies signs of infection. He reports using the [place], hydrofera blue and the Psychologist, forensic. 6/28; patient presents for follow-up. Patient declines total contact cast today. He has been using Keystone and [facility] to the wound bed. It is unclear if he is using the Psychologist, forensic. He would like a letter for the post man to delivers mail to his  front door so he does not have to walk down the driveway. 7/5; patient presents for follow-up. He states he is considering the total contact cast however does not want this placed today. He is actually going to the gym after this appointment. He does not want offloading foot wear for this. He has been using Keystone and [facility] to the wound bed. He denies signs of infection. 7/12; patient presents for follow-up. He is still considering the total contact cast however does not want this today. He has been using Keystone antibiotic and Hydrofera Blue to the wound bed. He denies signs of infection. He has no issues or complaints today. 7/26; patient presents for follow-up. He declines a total contact cast. He has been using his an antibiotic and Hydrofera Blue to the wound bed. He denies systemic signs of infection. He is scheduled to discuss orthotics next week with his podiatrist. Electronic Signature(s) Signed: 05/25/2022 10:41:29 AM By: Geralyn Corwin DO Entered By: Geralyn Corwin on 05/25/2022 09:26:35 [name] ([id number]) -------------------------------------------------------------------------------- Physical Exam Details Patient Name: [name] Date of Service: 05/25/2022 8:15 AM Medical Record Number: [id number] Patient Account Number: [id number] Date of Birth/Sex: 09-27-1956 (65 y.o. M) Treating RN: Huel Coventry Primary Care Provider: Manson Allan Other Clinician: Betha Loa Referring Provider: Manson Allan Treating Provider/Extender: Tilda Franco in Treatment: 28 Constitutional . Cardiovascular . Psychiatric . Notes Right foot: To the plantar aspect of the first metatarsal head there is an open wound with granulation tissue, nonviable tissue and callus. No surrounding signs of infection. Electronic Signature(s) Signed: 05/25/2022 10:41:29 AM By: Geralyn Corwin DO Entered By: Geralyn Corwin on 05/25/2022 09:27:20 [name]  ([id number]) -------------------------------------------------------------------------------- Physician Orders Details Patient Name: [name] Date of Service: 05/25/2022 8:15 AM Medical Record Number: [id number] Patient Account Number: [id number] Date of Birth/Sex: [birth year] (65 y.o. M) Treating RN: Huel Coventry Primary Care Provider: Manson Allan Other Clinician: Betha Loa Referring Provider: Manson Allan Treating Provider/Extender: Tilda Franco in Treatment: 74 Verbal / Phone Orders: No Diagnosis Coding Follow-up Appointments o Return Appointment in 1 week. o Nurse Visit as needed Bathing/ Shower/ Hygiene o May shower; gently cleanse wound with antibacterial soap, rinse and pat dry prior to dressing wounds o No tub bath. Anesthetic (Use 'Patient Medications' Section for Anesthetic Order Entry) o Lidocaine applied to wound bed Edema Control - Lymphedema / Segmental Compressive Device / Other o Elevate, Exercise Daily and Avoid Standing for Long Periods of Time. o Elevate leg(s) parallel to the floor when sitting. o DO YOUR BEST to sleep in the bed at night. DO NOT sleep in your recliner. Long hours of sitting in a recliner leads to swelling of the legs and/or potential wounds on your backside. Off-Loading o Foot Defender o - right foot-keep shin covered-keep pressure off of the wound (Patient is not wear boot to clinic each week. Wearing tennis shoe) Additional Orders / Instructions o Follow Nutritious Diet and Increase Protein Intake - monitor blood sugar to maintain normal limits   o Other: - Follow up post surgery for left foot-[facility] handling Medications-Please add to medication list. o P.O. Antibiotics - stop keystone antibiotic for now Wound Treatment Wound #1 - Foot Wound Laterality: Plantar, Right Cleanser: Normal Saline Every Other Day/30 Days Discharge Instructions: Wash your hands with soap and water. Remove old  dressing, discard into plastic bag and place into trash. Cleanse the wound with Normal Saline prior to applying a clean dressing using gauze sponges, not tissues or cotton balls. Do not scrub or use excessive force. Pat dry using gauze sponges, not tissue or cotton balls. Cleanser: Soap and Water Every Other Day/30 Days Discharge Instructions: Gently cleanse wound with antibacterial soap, rinse and pat dry prior to dressing wounds Primary Dressing: Hydrofera Blue Ready Transfer Foam, 2.5x2.5 (in/in) Every Other Day/30 Days Discharge Instructions: Apply Hydrofera Blue Ready to wound bed as directed Secondary Dressing: Gauze Every Other Day/30 Days Discharge Instructions: As directed: dry, moistened with saline or moistened with Dakins Solution Secured With: Conform 4'' - Conforming Stretch Gauze Bandage 4x75 (in/in) Every Other Day/30 Days Discharge Instructions: Apply as directed Electronic Signature(s) Signed: 05/25/2022 10:41:29 AM By: Geralyn Corwin DO Entered By: Geralyn Corwin on 05/25/2022 09:34:11 [name] ([id number]) -------------------------------------------------------------------------------- Problem List Details Patient Name: [name] Date of Service: 05/25/2022 8:15 AM Medical Record Number: [id number] Patient Account Number: [id number] Date of Birth/Sex: 08/24/1956 (65 y.o. M) Treating RN: Huel Coventry Primary Care Provider: Manson Allan Other Clinician: Betha Loa Referring Provider: Manson Allan Treating Provider/Extender: Tilda Franco in Treatment: 25 Active Problems ICD-10 Encounter Code Description Active Date MDM Diagnosis L97.512 Non-pressure chronic ulcer of other part of right foot with fat layer 11/10/2021 No Yes exposed E11.621 Type 2 diabetes mellitus with foot ulcer 11/10/2021 No Yes E11.40 Type 2 diabetes mellitus with diabetic neuropathy, unspecified 11/10/2021 No Yes R26.89 Other abnormalities of gait and mobility 11/10/2021  No Yes Inactive Problems ICD-10 Code Description Active Date Inactive Date L97.528 Non-pressure chronic ulcer of other part of left foot with other specified 12/01/2021 12/01/2021 severity Resolved Problems Electronic Signature(s) Signed: 05/25/2022 10:41:29 AM By: Geralyn Corwin DO Entered By: Geralyn Corwin on 05/25/2022 09:25:49 [name] ([id number]) -------------------------------------------------------------------------------- Progress Note Details Patient Name: [name] Date of Service: 05/25/2022 8:15 AM Medical Record Number: [id number] Patient Account Number: [id number] Date of Birth/Sex: [birth year] (65 y.o. M) Treating RN: Huel Coventry Primary Care Provider: Manson Allan Other Clinician: Betha Loa Referring Provider: Manson Allan Treating Provider/Extender: Tilda Franco in Treatment: 28 Subjective Chief Complaint Information obtained from Patient Right plantar foot wound History of Present Illness (HPI) Admission 11/10/2021 [name] is a 66 year old male with a past medical history of uncontrolled type 2 diabetes with last hemoglobin A1c of 8.1 complicated by peripheral neuropathy that presents to the clinic for a 2-year history of nonhealing ulcer to the bottom of his right foot. He states this started out as a blister caused by a work boot. He reports receiving wound care when he resided in [place]. He is reestablishing his wound care in [facility] today. He is currently keeping the area clean and covered. He has insoles designed to help offload the wound bed. He currently denies signs of infection. 1/18; patient presents for follow-up. He has been using Hydrofera Blue to the wound bed. He has no issues or complaints today. He has not received the defender.[name] He denies signs of infection. 2/1; patient presents for follow-up. He has been using Hydrofera Blue to the wound bed he states he  received the defender boot and has  been using it however he did not have it on today. He currently denies signs of infection to the right foot. Unfortunately he developed a wound to the left great toe over the past week. He states he received new orthotics and these caused a blister to the left great toe which turned into a wound. He has not been doing anything to the wound bed. He currently denies systemic signs of infection. 2/8; Patient presents for follow-up. 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 He states he is about 90% recovered. He has some mild weakness to the left side. He reports taking the antibiotics prescribed at last clinic visit. He reports using Hydrofera Blue for dressing changes. He only uses the offloading boot when he is at home. He uses open toed shoes to the right foot. He currently denies signs of infection. 2/15; patient presents for follow-up. He states he is still taking the antibiotics prescribed. He reports using the defender boot to the right foot and open toed shoe to the left foot however he is not wearing either today. He currently denies systemic signs of infection. He stated he cut down a tree last week and was outside doing yard work. 2/22; patient presents for follow-up. 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 He reports feeling well. He reports using the defender boot to the right leg and continues to use Hydrofera Blue with dressing changes. He denies systemic signs of infection. 3/8; patient presents for follow-up. He sees Dr. Alberteen Spindle tomorrow for follow-up of the left great toe amputation. This was a partial amputation. He reports he is taking Augmentin currently. He reports increased erythema to the left great toe amputation site. He also reports a decline in his right plantar foot wound. He reports it is draining more. He denies purulent  drainage. 3/15; patient presents for follow-up. He did not see Dr. Alberteen Spindle last week but states he sees him today. He states he has been taking doxycycline in the past week and started gentamicin ointment. He continues to use [facility] with dressing changes. He currently denies systemic signs of infection. 3/22; patient presents for follow-up. Patient had partial amputation of the left great toe on 2/15. 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 Alberteen Spindle. He reports no issues and has no complaints. He is currently taking Keflex and doxycycline prescribed by Dr. Alberteen Spindle. He continues to use gentamicin ointment and Hydrofera Blue to the right foot wound. He reports using the defender boot when he is at home. 3/29; patient presents for follow-up. He continues to be on antibiotics per podiatry. He has been using Hydrofera Blue and gentamicin ointment to the right plantar foot wound. He has no issues or complaints today. He denies signs of infection. 4/5; patient presents for follow-up. He has been using Dakin's wet-to-dry dressings with improvement to wound healing. He denies signs of infection. She has no issues or complaints today. 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 Use Hydrofera Blue for a prolonged period of time and has been using Dakin's wet- to-dry I think for the last 2 to 3 weeks. He has a Psychologist, forensic at home but he does not wear that coming into the clinic because he drives. He tells me he has been compliant with  the defender boot short of when he goes out to drive. He lives alone. He also tells me he has had a previous left great toe amputation We are following him for the right foot wound. 4/26; patient presents for follow-up. He had worsening of his left foot wound and had surgical debridement on 4/16 by Dr. Fanny Skates. He is being followed by podiatry for this issue. He he is on IV Unasyn based on tissue culture by ID.[name] We are  following him for the right foot wound. He has been using Dakin's wet-to-dry dressings without issues. He reports continued usage of the defender boot. 5/3; patient presents for follow-up. He has been using Dakin's wet-to-dry dressings to the right foot wound. He has no issues or complaints today. He reports using the Psychologist, forensic. He reports offloading the foot wound when he is at home by sitting in a recliner. He denies signs of infection. He is still getting IV antibiotics For the left foot wound that is being followed by podiatry. He states the end date is later this month on 5/28. [name], [name] ([id number]) 5/17; patient presents for follow-up. He missed his last clinic appointment. He has been using Dakin's wet-to-dry dressings. He states he is offloading the right foot wound with the defender boot although he does not have this today. He is still on IV antibiotics. He has no issues or complaints today. 5/24; patient presents for follow-up. He has been using Hydrofera Blue to the wound bed without issues. He denies signs of infection. At last clinic visit he had a wound culture done that detected high levels of Enterococcus faecalis and low levels of Corynebacterium stratum and Staphylococcus epidermidis. [facility] antibiotics were ordered. He has not heard from the company yet. 5/31; patient presents for follow-up. He continues to use Hydrofera Blue to the wound bed. He denies signs of infection. [facility]stone antibiotics were ordered at last clinic visit and he states he called the company to pay for it. He states that it is being shipped. We rediscussed the total contact cast and he declines having this placed today. He states he is wearing the defender boot however he never wears it into the office. 04-06-2022 upon evaluation today patient appears to be doing about the same in regard to his wound. This is not significantly smaller. He is not wearing his offloading boot. We discussed that today he  tells me that he wears it "all the time as he chuckled and does not have it on during the office visit today. I think it is increasingly obvious that he is just messing around when he says this based on what I am seeing and otherwise I do not know why he would wear it all the time but not to the clinic. Either way my opinion based on what I am seeing currently is simply that the wound does seem to be still having quite a bit of callus buildup around the edges it is also very dry. He does tell me that he has been using the [facility] since he got it and to be honest I think this is good and should hopefully help keep things under control but at the same time I do not see any evidence of active infection. Again the Jodie Echevaria is a topical antibiotic with multiple compounds to help prevent further infection and worsening overall. 6/14; patient presents for follow-up. He has been using [facility] antibiotics with [employer]	[place] daily. He states he is wearing his offloading boot. He does not have  this in office today. He is going to the beach for the next 5 days. He states he Will not be using using any offloading device During this time. I again offered the total contact cast however he declined. I did ask him to reconsider for next clinic visit. He currently denies signs of infection. 6/21; patient presents for follow-up. He went to the beach last week. He did not use an offloading device. Wound is slightly deeper today. I again offered the total contact cast but he declined. Currently denies signs of infection. He reports using the [place], hydrofera blue and the Psychologist, forensic. 6/28; patient presents for follow-up. Patient declines total contact cast today. He has been using Keystone and [facility] to the wound bed. It is unclear if he is using the Psychologist, forensic. He would like a letter for the post man to delivers mail to his front door so he does not have to walk down the driveway. 7/5; patient  presents for follow-up. He states he is considering the total contact cast however does not want this placed today. He is actually going to the gym after this appointment. He does not want offloading foot wear for this. He has been using Keystone and [facility] to the wound bed. He denies signs of infection. 7/12; patient presents for follow-up. He is still considering the total contact cast however does not want this today. He has been using Keystone antibiotic and Hydrofera Blue to the wound bed. He denies signs of infection. He has no issues or complaints today. 7/26; patient presents for follow-up. He declines a total contact cast. He has been using his an antibiotic and Hydrofera Blue to the wound bed. He denies systemic signs of infection. He is scheduled to discuss orthotics next week with his podiatrist. Objective Constitutional Vitals Time Taken: 8:10 AM, Height: 74 in, Weight: 226 lbs, BMI: 29, Temperature: 97.6 F, Pulse: 70 bpm, Respiratory Rate: 16 breaths/min, Blood Pressure: 133/73 mmHg. General Notes: Right foot: To the plantar aspect of the first metatarsal head there is an open wound with granulation tissue, nonviable tissue and callus. No surrounding signs of infection. Integumentary (Hair, Skin) Wound #1 status is Open. Original cause of wound was Blister. The date acquired was: 07/15/2020. The wound has been in treatment 28 weeks. The wound is located on the Right,Plantar Foot. The wound measures 0.5cm length x 0.7cm width x 0.4cm depth; 0.275cm^2 area and 0.11cm^3 volume. There is Fat Layer (Subcutaneous Tissue) exposed. There is a medium amount of serosanguineous drainage noted. The wound margin is thickened. There is large (67-100%) pink granulation within the wound bed. There is a small (1-33%) amount of necrotic tissue within the wound bed including Adherent Slough. 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 He is at high risk for amputation. He is understanding of this. I highly recommended the total contact cast. He declined. He needs pressure relief. He states he wears a Psychologist, forensic. However he does not wear this into the office. It is doubtful that he is wearing this consistently. There is slightly more maceration on exam. This is likely from the [facility] antibiotic and I recommended stopping this over the next week and continuing with Hydrofera Blue. Procedures Wound #1 Pre-procedure diagnosis  of Wound #1 is a Diabetic Wound/Ulcer of the Lower Extremity located on the Right,Plantar Foot .Severity of Tissue Pre Debridement is: Fat layer exposed. There was a Excisional Skin/Subcutaneous Tissue Debridement with a total area of 2.25 sq cm performed by Geralyn Corwin, MD. With the following instrument(s): Curette to remove Non-Viable tissue/material. Material removed includes Callus and Subcutaneous Tissue and. A time out was conducted at 08:53, prior to the start of the procedure. A Minimum amount of bleeding was controlled with Pressure. The procedure was tolerated well. Post Debridement Measurements: 0.6cm length x 0.8cm width x 0.5cm depth; 0.188cm^3 volume. Character of Wound/Ulcer Post Debridement is stable. Severity of Tissue Post Debridement is: Fat layer exposed. Post procedure Diagnosis Wound #1: Same as Pre-Procedure Plan Follow-up Appointments: Return Appointment in 1 week. Nurse Visit as needed Bathing/ Shower/ Hygiene: May shower; gently cleanse wound with antibacterial soap, rinse and pat dry prior to dressing wounds No tub bath. Anesthetic (Use 'Patient Medications' Section for Anesthetic Order Entry): Lidocaine applied to wound  bed Edema Control - Lymphedema / Segmental Compressive Device / Other: Elevate, Exercise Daily and Avoid Standing for Long Periods of Time. Elevate leg(s) parallel to the floor when sitting. DO YOUR BEST to sleep in the bed at night. DO NOT sleep in your recliner. Long hours of sitting in a recliner leads to swelling of the legs and/or potential wounds on your backside. Off-Loading: Foot Defender  - right foot-keep shin covered-keep pressure off of the wound (Patient is not wear boot to clinic each week. Wearing tennis shoe) Additional Orders / Instructions: Follow Nutritious Diet and Increase Protein Intake - monitor blood sugar to maintain normal limits Other: - Follow up post surgery for left foot-[facility] handling Medications-Please add to medication list.: P.O. Antibiotics - stop keystone antibiotic for now WOUND #1: - Foot Wound Laterality: Plantar, Right Cleanser: Normal Saline Every Other Day/30 Days Discharge Instructions: Wash your hands with soap and water. Remove old dressing, discard into plastic bag and place into trash. Cleanse the wound with Normal Saline prior to applying a clean dressing using gauze sponges, not tissues or cotton balls. Do not scrub or use excessive force. Pat dry using gauze sponges, not tissue or cotton balls. Cleanser: Soap and Water Every Other Day/30 Days Discharge Instructions: Gently cleanse wound with antibacterial soap, rinse and pat dry prior to dressing wounds Primary Dressing: Hydrofera Blue Ready Transfer Foam, 2.5x2.5 (in/in) Every Other Day/30 Days Discharge Instructions: Apply Hydrofera Blue Ready to wound bed as directed Secondary Dressing: Gauze Every Other Day/30 Days Discharge Instructions: As directed: dry, moistened with saline or moistened with Dakins Solution Secured With: Conform 4'' - Conforming Stretch Gauze Bandage 4x75 (in/in) Every Other Day/30 Days Discharge Instructions: Apply as directed 1. In office sharp  debridement 2. Hydrofera Blue 3. Aggressive offloading defender. 4. Follow-up in 1 week [name], [name] ([id number]) Electronic Signature(s) Signed: 05/25/2022 10:41:29 AM By: Geralyn Corwin DO Entered By: Geralyn Corwin on 05/25/2022 09:33:43 [name] ([id number]) -------------------------------------------------------------------------------- SuperBill Details Patient Name: [name] Date of Service: 05/25/2022 Medical Record Number: [id number] Patient Account Number: [id number] Date of Birth/Sex: 01-16-1956 (65 y.o. 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 E11.621 Type 2 diabetes mellitus with foot ulcer E11.40 Type 2 diabetes mellitus with diabetic neuropathy, unspecified R26.89  Other abnormalities of gait and mobility Facility Procedures CPT4 Code: 16109604 Description: 11042 - DEB SUBQ TISSUE 20 SQ CM/< Modifier: Quantity: 1 CPT4 Code: Description: ICD-10 Diagnosis Description L97.512 Non-pressure chronic ulcer of other part of right foot with fat layer ex E11.621 Type 2 diabetes mellitus with foot ulcer Modifier: posed Quantity: Physician Procedures CPT4 Code: 5409811 Description: 11042 - WC PHYS SUBQ TISS 20 SQ CM Modifier: Quantity: 1 CPT4 Code: Description: ICD-10 Diagnosis Description L97.512 Non-pressure chronic ulcer of other part of right foot with fat layer ex E11.621 Type 2 diabetes mellitus with foot ulcer Modifier: posed Quantity: Electronic Signature(s) Signed: 05/25/2022 10:41:29 AM By: Geralyn Corwin DO Entered By: Geralyn Corwin on 05/25/2022 09:34:01

## 2022-05-26 NOTE — Progress Notes (Signed)
Subjective:  Patient ID: [name], male    DOB: 1956-08-14,  MRN: [id number]  Chief Complaint  Patient presents with   Diabetes    66 y.o. male presents with the above complaint.  Patient presents with complaint of bilateral hammertoe contractures 2 through 4.  Patient has a history of left partial hallux amputation.  He states that he had the amputated few months ago.  He states he is doing okay he is following up with the wound care center for the right submetatarsal 1 ulceration and is primarily being managed there.  He is interested in getting diabetic shoes.  He denies anyone seeing anyone else.  He denies any other acute complaints is a diabetic with last A1c of 5.5   Review of Systems: Negative except as noted in the HPI. Denies N/V/F/Ch.  Past Medical History:  Diagnosis Date   Diabetes mellitus without complication (HCC)    Gout     Current Outpatient Medications:    allopurinol (ZYLOPRIM) 300 MG tablet, Take 300 mg by mouth 2 (two) times daily., Disp: , Rfl:    atorvastatin (LIPITOR) 80 MG tablet, Take 1 tablet (80 mg total) by mouth daily., Disp: 90 tablet, Rfl: 0   buPROPion (WELLBUTRIN XL) 150 MG 24 hr tablet, Take 150 mg by mouth daily., Disp: , Rfl:    gabapentin (NEURONTIN) 300 MG capsule, Take 3 capsules (900 mg total) by mouth 3 (three) times daily., Disp: , Rfl:    HYDROcodone-acetaminophen (NORCO/VICODIN) 5-325 MG tablet, Take 1-2 tablets by mouth every 6 (six) hours as needed for severe pain. (Patient not taking: Reported on 05/12/2022), Disp: 30 tablet, Rfl: 0   lisinopril (ZESTRIL) 10 MG tablet, Take 0.5 tablets (5 mg total) by mouth daily. (Patient not taking: Reported on 05/12/2022), Disp: , Rfl:    polyethylene glycol (MIRALAX / GLYCOLAX) 17 g packet, Take 17 g by mouth daily as needed for moderate constipation., Disp: 14 each, Rfl: 0   Semaglutide 7 MG TABS, Take 14 mg by mouth daily., Disp: , Rfl:    testosterone cypionate (DEPOTESTOSTERONE CYPIONATE) 200  MG/ML injection, Inject into the muscle every 14 (fourteen) days., Disp: , Rfl:   Social History   Tobacco Use  Smoking Status Former   Types: Cigarettes   Quit date: 10/07/1997   Years since quitting: 24.6  Smokeless Tobacco Never    No Known Allergies Objective:  There were no vitals filed for this visit. There is no height or weight on file to calculate BMI. Constitutional Well developed. Well nourished.  Vascular Dorsalis pedis pulses palpable bilaterally. Posterior tibial pulses palpable bilaterally. Capillary refill normal to all digits.  No cyanosis or clubbing noted. Pedal hair growth normal.  Neurologic Normal speech. Oriented to person, place, and time. Epicritic sensation to light touch grossly present bilaterally.  Dermatologic Hammertoe contractures noted 2 through 4 bilaterally history of hallux amputation partial.  Submetatarsal 1 ulceration with fat layer exposed being primarily managed at the wound care center  Orthopedic: Normal joint ROM without pain or crepitus bilaterally. No visible deformities. No bony tenderness.   Radiographs: None Assessment:   1. Hammertoe, bilateral   2. Type 2 diabetes mellitus without complication, unspecified whether long term insulin use (HCC)    Plan:  Patient was evaluated and treated and all questions answered.  Bilateral hammertoe contractures 2 through 4 -All questions and concerns were discussed with the patient in extensive detail given the presence of hammertoe contracture and a history of amputation I believe patient  would benefit from diabetic shoes.  He will follow-up in the [facility] office to be casted for diabetic shoes with offloading of right submetatarsal 1.  right submet 1 wound with fat layer exposed -Primarily managed at the wound care center I will defer further management to them.  No follow-ups on file.

## 2022-06-01 ENCOUNTER — Encounter: Payer: Medicare Other | Attending: Internal Medicine | Admitting: Internal Medicine

## 2022-06-01 ENCOUNTER — Ambulatory Visit (INDEPENDENT_AMBULATORY_CARE_PROVIDER_SITE_OTHER): Payer: Medicare Other

## 2022-06-01 DIAGNOSIS — E1142 Type 2 diabetes mellitus with diabetic polyneuropathy: Secondary | ICD-10-CM | POA: Diagnosis not present

## 2022-06-01 DIAGNOSIS — Z89412 Acquired absence of left great toe: Secondary | ICD-10-CM | POA: Insufficient documentation

## 2022-06-01 DIAGNOSIS — L97512 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ed: Secondary | ICD-10-CM | POA: Insufficient documentation

## 2022-06-01 DIAGNOSIS — E1140 Type 2 diabetes mellitus with diabetic neuropathy, unspecified: Secondary | ICD-10-CM | POA: Insufficient documentation

## 2022-06-01 DIAGNOSIS — R2689 Other abnormalities of gait and mobility: Secondary | ICD-10-CM | POA: Diagnosis not present

## 2022-06-01 DIAGNOSIS — L97419 Non-pressure chronic ulcer of right heel and midfoot with unspecified severity: Secondary | ICD-10-CM | POA: Diagnosis not present

## 2022-06-01 DIAGNOSIS — E11621 Type 2 diabetes mellitus with foot ulcer: Secondary | ICD-10-CM | POA: Diagnosis not present

## 2022-06-01 DIAGNOSIS — E119 Type 2 diabetes mellitus without complications: Secondary | ICD-10-CM

## 2022-06-01 NOTE — Progress Notes (Signed)
KASCH, BORQUEZ ([id number]) Visit Report for 06/01/2022 Chief Complaint Document Details Patient Name: [name], [name] Date of Service: 06/01/2022 8:30 AM Medical Record Number: [id number] Patient Account Number: [id number] Date of Birth/Sex: Feb 21, 1956 (65 y.o. M) Treating RN: Angelina Pih Primary Care Provider: Manson Allan Other Clinician: Referring Provider: Manson Allan Treating Provider/Extender: Tilda Franco in Treatment: 29 Information Obtained from: Patient Chief Complaint Right plantar foot wound Electronic Signature(s) Signed: 06/01/2022 10:27:22 AM By: Geralyn Corwin DO Entered By: Geralyn Corwin on 06/01/2022 09:16:16 [name] ([id number]) -------------------------------------------------------------------------------- HPI Details Patient Name: [name] Date of Service: 06/01/2022 8:30 AM Medical Record Number: [id number] Patient Account Number: [id number] Date of Birth/Sex: 11-Oct-1956 (65 y.o. M) Treating RN: Angelina Pih Primary Care Provider: Manson Allan Other Clinician: Referring Provider: Manson Allan Treating Provider/Extender: Tilda Franco in Treatment: 52 History of Present Illness HPI Description: Admission 11/10/2021 [name] is a 66 year old male with a past medical history of uncontrolled type 2 diabetes with last hemoglobin A1c of 8.1 complicated by peripheral neuropathy that presents to the clinic for a 2-year history of nonhealing ulcer to the bottom of his right foot. He states this started out as a blister caused by a work boot. He reports receiving wound care when he resided in [place]. He is reestablishing his wound care in [facility] today. He is currently keeping the area clean and covered. He has insoles designed to help offload the wound bed. He currently denies signs of infection. 1/18; patient presents for follow-up. He has been using Hydrofera Blue to the wound bed. He has no issues  or complaints today. He has not received the defender.[name] He denies signs of infection. 2/1; patient presents for follow-up. He has been using Hydrofera Blue to the wound bed he states he received the defender boot and has been using it however he did not have it on today. He currently denies signs of infection to the right foot. Unfortunately he developed a wound to the left great toe over the past week. He states he received new orthotics and these caused a blister to the left great toe which turned into a wound. He has not been doing anything to the wound bed. He currently denies systemic signs of infection. 2/8; Patient presents for follow-up. 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 He states he is about 90% recovered. He has some mild weakness to the left side. He reports taking the antibiotics prescribed at last clinic visit. He reports using Hydrofera Blue for dressing changes. He only uses the offloading boot when he is at home. He uses open toed shoes to the right foot. He currently denies signs of infection. 2/15; patient presents for follow-up. He states he is still taking the antibiotics prescribed. He reports using the defender boot to the right foot and open toed shoe to the left foot however he is not wearing either today. He currently denies systemic signs of infection. He stated he cut down a tree last week and was outside doing yard work. 2/22; patient presents for follow-up. 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 He reports feeling well. He reports using the defender boot to the right leg and continues to use Hydrofera Blue with dressing changes. He denies systemic signs of infection. 3/8; patient presents for follow-up. He sees Dr. Alberteen Spindle tomorrow for follow-up of the left  great toe amputation. This was a partial amputation.  He reports he is taking Augmentin currently. He reports increased erythema to the left great toe amputation site. He also reports a decline in his right plantar foot wound. He reports it is draining more. He denies purulent drainage. 3/15; patient presents for follow-up. He did not see Dr. Alberteen Spindle last week but states he sees him today. He states he has been taking doxycycline in the past week and started gentamicin ointment. He continues to use [facility] with dressing changes. He currently denies systemic signs of infection. 3/22; patient presents for follow-up. Patient had partial amputation of the left great toe on 2/15. 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 Alberteen Spindle. He reports no issues and has no complaints. He is currently taking Keflex and doxycycline prescribed by Dr. Alberteen Spindle. He continues to use gentamicin ointment and Hydrofera Blue to the right foot wound. He reports using the defender boot when he is at home. 3/29; patient presents for follow-up. He continues to be on antibiotics per podiatry. He has been using Hydrofera Blue and gentamicin ointment to the right plantar foot wound. He has no issues or complaints today. He denies signs of infection. 4/5; patient presents for follow-up. He has been using Dakin's wet-to-dry dressings with improvement to wound healing. He denies signs of infection. She has no issues or complaints today. 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 Use Hydrofera Blue for a prolonged period of time and has been using Dakin's wet- to-dry I think for the last 2 to 3 weeks. He has a Psychologist, forensic at home but he does not wear that coming into the clinic because he drives. He tells me he has been compliant with the defender boot short of when he goes out to drive. He lives alone. He also tells me he has had a previous left great toe amputation We are following him for the right foot wound. 4/26; patient presents  for follow-up. He had worsening of his left foot wound and had surgical debridement on 4/16 by Dr. Fanny Skates. He is being followed by podiatry for this issue. He he is on IV Unasyn based on tissue culture by ID.[name] We are following him for the right foot wound. He has been using Dakin's wet-to-dry dressings without issues. He reports continued usage of the defender boot. 5/3; patient presents for follow-up. He has been using Dakin's wet-to-dry dressings to the right foot wound. He has no issues or complaints today. He reports using the Psychologist, forensic. He reports offloading the foot wound when he is at home by sitting in a recliner. He denies signs of infection. He is still getting IV antibiotics For the left foot wound that is being followed by podiatry. He states the end date is later this month on 5/28. 5/17; patient presents for follow-up. He missed his last clinic appointment. He has been using Dakin's wet-to-dry dressings. He states he is offloading the right foot wound with the defender boot although he does not have this today. He is still on IV antibiotics. He has no issues or complaints today. 5/24; patient presents for follow-up. He has been using Hydrofera Blue to the wound bed without issues. He denies signs of infection. At last clinic [facility] [place] ([id number]) visit he had a wound culture done that detected high levels of Enterococcus faecalis and low levels of Corynebacterium stratum and Staphylococcus epidermidis. [facility] antibiotics were ordered. He has not heard from  the company yet. 5/31; patient presents for follow-up. He continues to use Hydrofera Blue to the wound bed. He denies signs of infection. [facility] antibiotics were ordered at last clinic visit and he states he called the company to pay for it. He states that it is being shipped. We rediscussed the total contact cast and he declines having this placed today. He states he is wearing the defender boot however he never wears  it into the office. 04-06-2022 upon evaluation today patient appears to be doing about the same in regard to his wound. This is not significantly smaller. He is not wearing his offloading boot. We discussed that today he tells me that he wears it "all the time as he chuckled and does not have it on during the office visit today. I think it is increasingly obvious that he is just messing around when he says this based on what I am seeing and otherwise I do not know why he would wear it all the time but not to the clinic. Either way my opinion based on what I am seeing currently is simply that the wound does seem to be still having quite a bit of callus buildup around the edges it is also very dry. He does tell me that he has been using the [facility] since he got it and to be honest I think this is good and should hopefully help keep things under control but at the same time I do not see any evidence of active infection. Again the Jodie Echevaria is a topical antibiotic with multiple compounds to help prevent further infection and worsening overall. 6/14; patient presents for follow-up. He has been using [facility] antibiotics with [employer]	[place] daily. He states he is wearing his offloading boot. He does not have this in office today. He is going to the beach for the next 5 days. He states he Will not be using using any offloading device During this time. I again offered the total contact cast however he declined. I did ask him to reconsider for next clinic visit. He currently denies signs of infection. 6/21; patient presents for follow-up. He went to the beach last week. He did not use an offloading device. Wound is slightly deeper today. I again offered the total contact cast but he declined. Currently denies signs of infection. He reports using the [place], hydrofera blue and the Psychologist, forensic. 6/28; patient presents for follow-up. Patient declines total contact cast today. He has been using [facility] and  [facility] to the wound bed. It is unclear if he is using the Psychologist, forensic. He would like a letter for the post man to delivers mail to his front door so he does not have to walk down the driveway. 7/5; patient presents for follow-up. He states he is considering the total contact cast however does not want this placed today. He is actually going to the gym after this appointment. He does not want offloading foot wear for this. He has been using [facility] and [facility] to the wound bed. He denies signs of infection. 7/12; patient presents for follow-up. He is still considering the total contact cast however does not want this today. He has been using [facility] antibiotic and Hydrofera Blue to the wound bed. He denies signs of infection. He has no issues or complaints today. 7/26; patient presents for follow-up. He declines a total contact cast. He has been using his an antibiotic and Hydrofera Blue to the wound bed. He denies systemic signs of  infection. He is scheduled to discuss orthotics next week with his podiatrist. 8/2; patient presents for follow-up. He has been using Hydrofera Blue to the wound bed. He has no issues or complaints today. He declines a total contact cast. He states he is getting orthotics today. Electronic Signature(s) Signed: 06/01/2022 10:27:22 AM By: Geralyn Corwin DO Entered By: Geralyn Corwin on 06/01/2022 09:17:33 [name] ([id number]) -------------------------------------------------------------------------------- Physical Exam Details Patient Name: [name] Date of Service: 06/01/2022 8:30 AM Medical Record Number: [id number] Patient Account Number: [id number] Date of Birth/Sex: [birth year] (65 y.o. M) Treating RN: Angelina Pih Primary Care Provider: Manson Allan Other Clinician: Referring Provider: Manson Allan Treating Provider/Extender: Tilda Franco in Treatment:  79 Constitutional . Cardiovascular . Psychiatric . Notes Right foot: To the plantar aspect of the first metatarsal head there is an open wound with granulation tissue although not healthy in appearance. No surrounding signs of infection. Electronic Signature(s) Signed: 06/01/2022 10:27:22 AM By: Geralyn Corwin DO Entered By: Geralyn Corwin on 06/01/2022 09:17:38 [name] ([id number]) -------------------------------------------------------------------------------- Physician Orders Details Patient Name: [name] Date of Service: 06/01/2022 8:30 AM Medical Record Number: [id number] Patient Account Number: [id number] Date of Birth/Sex: 04/09/1956 (65 y.o. M) Treating RN: Angelina Pih Primary Care Provider: Manson Allan Other Clinician: Referring Provider: Manson Allan Treating Provider/Extender: Tilda Franco in Treatment: 90 Verbal / Phone Orders: No Diagnosis Coding Follow-up Appointments o Return Appointment in 1 week. o Nurse Visit as needed Bathing/ Shower/ Hygiene o May shower; gently cleanse wound with antibacterial soap, rinse and pat dry prior to dressing wounds o No tub bath. Anesthetic (Use 'Patient Medications' Section for Anesthetic Order Entry) o Lidocaine applied to wound bed Edema Control - Lymphedema / Segmental Compressive Device / Other o Elevate, Exercise Daily and Avoid Standing for Long Periods of Time. o Elevate leg(s) parallel to the floor when sitting. o DO YOUR BEST to sleep in the bed at night. DO NOT sleep in your recliner. Long hours of sitting in a recliner leads to swelling of the legs and/or potential wounds on your backside. Off-Loading o Foot Defender o - right foot-keep shin covered-keep pressure off of the wound (Patient is not wear boot to clinic each week. Wearing tennis shoe) Additional Orders / Instructions o Follow Nutritious Diet and Increase Protein Intake - monitor blood sugar to maintain  normal limits o Other: - Follow up post surgery for left foot-[facility] handling Medications-Please add to medication list. o P.O. Antibiotics - stop keystone antibiotic for now Wound Treatment Wound #1 - Foot Wound Laterality: Plantar, Right Cleanser: Normal Saline Every Other Day/30 Days Discharge Instructions: Wash your hands with soap and water. Remove old dressing, discard into plastic bag and place into trash. Cleanse the wound with Normal Saline prior to applying a clean dressing using gauze sponges, not tissues or cotton balls. Do not scrub or use excessive force. Pat dry using gauze sponges, not tissue or cotton balls. Cleanser: Soap and Water Every Other Day/30 Days Discharge Instructions: Gently cleanse wound with antibacterial soap, rinse and pat dry prior to dressing wounds Primary Dressing: Hydrofera Blue Ready Transfer Foam, 2.5x2.5 (in/in) Every Other Day/30 Days Discharge Instructions: Apply Hydrofera Blue Ready to wound bed as directed Secondary Dressing: Gauze Every Other Day/30 Days Discharge Instructions: As directed: dry, moistened with saline or moistened with Dakins Solution Secured With: Conform 4'' - Conforming Stretch Gauze Bandage 4x75 (in/in) Every Other Day/30 Days Discharge Instructions: Apply as directed Electronic Signature(s) Signed: 06/01/2022 10:27:22 AM By: Geralyn Corwin  DO Entered By: Geralyn Corwin on 06/01/2022 09:18:56 [name] ([id number]) -------------------------------------------------------------------------------- Problem List Details Patient Name: [name] Date of Service: 06/01/2022 8:30 AM Medical Record Number: [id number] Patient Account Number: [id number] Date of Birth/Sex: May 17, 1956 (65 y.o. M) Treating RN: Angelina Pih Primary Care Provider: Manson Allan Other Clinician: Referring Provider: Manson Allan Treating Provider/Extender: Tilda Franco in Treatment: 29 Active  Problems ICD-10 Encounter Code Description Active Date MDM Diagnosis L97.512 Non-pressure chronic ulcer of other part of right foot with fat layer 11/10/2021 No Yes exposed E11.621 Type 2 diabetes mellitus with foot ulcer 11/10/2021 No Yes E11.40 Type 2 diabetes mellitus with diabetic neuropathy, unspecified 11/10/2021 No Yes R26.89 Other abnormalities of gait and mobility 11/10/2021 No Yes Inactive Problems ICD-10 Code Description Active Date Inactive Date L97.528 Non-pressure chronic ulcer of other part of left foot with other specified 12/01/2021 12/01/2021 severity Resolved Problems Electronic Signature(s) Signed: 06/01/2022 10:27:22 AM By: Geralyn Corwin DO Entered By: Geralyn Corwin on 06/01/2022 09:16:11 [name] ([id number]) -------------------------------------------------------------------------------- Progress Note Details Patient Name: [name] Date of Service: 06/01/2022 8:30 AM Medical Record Number: [id number] Patient Account Number: [id number] Date of Birth/Sex: 1956/08/21 (65 y.o. M) Treating RN: Angelina Pih Primary Care Provider: Manson Allan Other Clinician: Referring Provider: Manson Allan Treating Provider/Extender: Tilda Franco in Treatment: 29 Subjective Chief Complaint Information obtained from Patient Right plantar foot wound History of Present Illness (HPI) Admission 11/10/2021 [name] is a 66 year old male with a past medical history of uncontrolled type 2 diabetes with last hemoglobin A1c of 8.1 complicated by peripheral neuropathy that presents to the clinic for a 2-year history of nonhealing ulcer to the bottom of his right foot. He states this started out as a blister caused by a work boot. He reports receiving wound care when he resided in [place]. He is reestablishing his wound care in [facility] today. He is currently keeping the area clean and covered. He has insoles designed to help offload the  wound bed. He currently denies signs of infection. 1/18; patient presents for follow-up. He has been using Hydrofera Blue to the wound bed. He has no issues or complaints today. He has not received the defender.[name] He denies signs of infection. 2/1; patient presents for follow-up. He has been using Hydrofera Blue to the wound bed he states he received the defender boot and has been using it however he did not have it on today. He currently denies signs of infection to the right foot. Unfortunately he developed a wound to the left great toe over the past week. He states he received new orthotics and these caused a blister to the left great toe which turned into a wound. He has not been doing anything to the wound bed. He currently denies systemic signs of infection. 2/8; Patient presents for follow-up. 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 He states he is about 90% recovered. He has some mild weakness to the left side. He reports taking the antibiotics prescribed at last clinic visit. He reports using Hydrofera Blue for dressing changes. He only uses the offloading boot when he is at home. He uses open toed shoes to the right foot. He currently denies signs of infection. 2/15; patient presents for follow-up. He states he is still taking the antibiotics prescribed. He reports using the defender boot to the right foot and open toed shoe to  the left foot however he is not wearing either today. He currently denies systemic signs of infection. He stated he cut down a tree last week and was outside doing yard work. 2/22; patient presents for follow-up. 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 He reports feeling well. He reports using the defender boot to the right leg and continues to use Hydrofera Blue with dressing changes. He denies systemic signs of  infection. 3/8; patient presents for follow-up. He sees Dr. Alberteen Spindle tomorrow for follow-up of the left great toe amputation. This was a partial amputation. He reports he is taking Augmentin currently. He reports increased erythema to the left great toe amputation site. He also reports a decline in his right plantar foot wound. He reports it is draining more. He denies purulent drainage. 3/15; patient presents for follow-up. He did not see Dr. Alberteen Spindle last week but states he sees him today. He states he has been taking doxycycline in the past week and started gentamicin ointment. He continues to use [facility] with dressing changes. He currently denies systemic signs of infection. 3/22; patient presents for follow-up. Patient had partial amputation of the left great toe on 2/15. 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 Alberteen Spindle. He reports no issues and has no complaints. He is currently taking Keflex and doxycycline prescribed by Dr. Alberteen Spindle. He continues to use gentamicin ointment and Hydrofera Blue to the right foot wound. He reports using the defender boot when he is at home. 3/29; patient presents for follow-up. He continues to be on antibiotics per podiatry. He has been using Hydrofera Blue and gentamicin ointment to the right plantar foot wound. He has no issues or complaints today. He denies signs of infection. 4/5; patient presents for follow-up. He has been using Dakin's wet-to-dry dressings with improvement to wound healing. He denies signs of infection. She has no issues or complaints today. 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 Use Hydrofera Blue for a prolonged period of time and has been using Dakin's wet- to-dry I think for the last 2 to 3 weeks. He has a Psychologist, forensic at home but he does not wear that coming into the clinic because he drives. He tells me he has been compliant with the defender boot short of when he goes out to drive.  He lives alone. He also tells me he has had a previous left great toe amputation We are following him for the right foot wound. 4/26; patient presents for follow-up. He had worsening of his left foot wound and had surgical debridement on 4/16 by Dr. Fanny Skates. He is being followed by podiatry for this issue. He he is on IV Unasyn based on tissue culture by ID.[name] We are following him for the right foot wound. He has been using Dakin's wet-to-dry dressings without issues. He reports continued usage of the defender boot. 5/3; patient presents for follow-up. He has been using Dakin's wet-to-dry dressings to the right foot wound. He has no issues or complaints today. He reports using the Psychologist, forensic. He reports offloading the foot wound when he is at home by sitting in a recliner. He denies signs of infection. He is still getting IV antibiotics For the left foot wound that is being followed by podiatry. He states the end date is later this month on 5/28. [name], [name] ([id number]) 5/17; patient presents for follow-up. He missed his last clinic appointment. He has been using  [employer]'s wet-to-dry dressings. He states he is offloading the right foot wound with the defender boot although he does not have this today. He is still on IV antibiotics. He has no issues or complaints today. 5/24; patient presents for follow-up. He has been using Hydrofera Blue to the wound bed without issues. He denies signs of infection. At last clinic visit he had a wound culture done that detected high levels of Enterococcus faecalis and low levels of Corynebacterium stratum and Staphylococcus epidermidis. [facility] antibiotics were ordered. He has not heard from the company yet. 5/31; patient presents for follow-up. He continues to use Hydrofera Blue to the wound bed. He denies signs of infection. [facility] antibiotics were ordered at last clinic visit and he states he called the company to pay for it. He states that it is being  shipped. We rediscussed the total contact cast and he declines having this placed today. He states he is wearing the defender boot however he never wears it into the office. 04-06-2022 upon evaluation today patient appears to be doing about the same in regard to his wound. This is not significantly smaller. He is not wearing his offloading boot. We discussed that today he tells me that he wears it "all the time as he chuckled and does not have it on during the office visit today. I think it is increasingly obvious that he is just messing around when he says this based on what I am seeing and otherwise I do not know why he would wear it all the time but not to the clinic. Either way my opinion based on what I am seeing currently is simply that the wound does seem to be still having quite a bit of callus buildup around the edges it is also very dry. He does tell me that [facility] and to be honest I think this is good and should hopefully help keep things under control but at the same time I do not see any evidence of active infection. Again the [facility] is a topical antibiotic with multiple compounds to help prevent further infection and worsening overall. 6/14; patient presents for follow-up. He has been using Keystone antibiotics with [employer]	[place]. He states he is wearing his offloading boot. He does not have this in office today. He is going to the beach for the next 5 days. He states he Will not be using using any offloading device During this time. I again offered the total contact cast however he declined. I did ask him to reconsider for next clinic visit. He currently denies signs of infection. 6/21; patient presents for follow-up. He went to the beach last week. He did not use an offloading device. Wound is slightly deeper today. I again offered the total contact cast but he declined. Currently denies signs of infection. He [place] using the [place],  hydrofera blue and the Psychologist, forensicdefender boot. 6/28; patient presents for follow-up. Patient declines total contact cast today. He has been using Keystone [facility] wound bed. It is unclear if he is using the Psychologist, forensicdefender boot. He would like a letter for the post man to delivers mail to his front door so he does not have to walk down the driveway. 7/5; patient presents for follow-up. He states he is considering the total contact cast however does not want this placed today. He is actually going to the gym after this appointment. He does not want offloading foot wear  for this. He has been using Keystone and [facility] to the wound bed. He denies signs of infection. 7/12; patient presents for follow-up. He is still considering the total contact cast however does not want this today. He has been using Keystone antibiotic and Hydrofera Blue to the wound bed. He denies signs of infection. He has no issues or complaints today. 7/26; patient presents for follow-up. He declines a total contact cast. He has been using his an antibiotic and Hydrofera Blue to the wound bed. He denies systemic signs of infection. He is scheduled to discuss orthotics next week with his podiatrist. 8/2; patient presents for follow-up. He has been using Hydrofera Blue to the wound bed. He has no issues or complaints today. He declines a total contact cast. He states he is getting orthotics today. Objective Constitutional Vitals Time Taken: 8:32 AM, Height: 74 in, Weight: 226 lbs, BMI: 29, Temperature: 97.6 F, Pulse: 65 bpm, Respiratory Rate: 18 breaths/min, Blood Pressure: 116/70 mmHg. General Notes: Right foot: To the plantar aspect of the first metatarsal head there is an open wound with granulation tissue although not healthy in appearance. No surrounding signs of infection. Integumentary (Hair, Skin) Wound #1 status is Open. Original cause of wound was Blister. The date acquired was: 07/15/2020. The wound has been  in treatment 29 weeks. The wound is located on the Right,Plantar Foot. The wound measures 0.8cm length x 0.5cm width x 0.5cm depth; 0.314cm^2 area and 0.157cm^3 volume. There is Fat Layer (Subcutaneous Tissue) exposed. There is no tunneling or undermining noted. There is a medium amount of serosanguineous drainage noted. The wound margin is thickened. There is large (67-100%) pink granulation within the wound bed. There is a small (1-33%) amount of necrotic tissue within the wound bed including Adherent Slough. 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 Without offloading this wound it will not heal. And thus putting him at high risk for amputation. He is well aware of this. He again declines the total contact cast. He is obtaining orthotics. I recommended continuing Hydrofera Blue. Plan Follow-up Appointments: Return Appointment in 1 week. Nurse Visit as needed Bathing/ Shower/ Hygiene: May shower; gently cleanse wound with antibacterial soap, rinse and pat dry prior to dressing wounds No tub bath. Anesthetic (Use 'Patient Medications' Section for Anesthetic Order Entry): Lidocaine applied to wound bed Edema Control - Lymphedema / Segmental Compressive Device / Other: Elevate, Exercise Daily and Avoid Standing for Long Periods of Time. Elevate leg(s) parallel to the floor when sitting. DO YOUR BEST to sleep in the bed at night. DO NOT sleep in your recliner. Long hours of sitting in a recliner leads to swelling of the legs and/or potential wounds on your backside. Off-Loading: Foot Defender  - right foot-keep shin covered-keep pressure off of the wound (Patient is not wear boot to clinic each week. Wearing tennis  shoe) Additional Orders / Instructions: Follow Nutritious Diet and Increase Protein Intake - monitor blood sugar to maintain normal limits Other: - Follow up post surgery for left foot-[facility] handling Medications-Please add to medication list.: P.O. Antibiotics - stop keystone antibiotic for now WOUND #1: - Foot Wound Laterality: Plantar, Right Cleanser: Normal Saline Every Other Day/30 Days Discharge Instructions: Wash your hands with soap and water. Remove old dressing,  discard into plastic bag and place into trash. Cleanse the wound with Normal Saline prior to applying a clean dressing using gauze sponges, not tissues or cotton balls. Do not scrub or use excessive force. Pat dry using gauze sponges, not tissue or cotton balls. Cleanser: Soap and Water Every Other Day/30 Days Discharge Instructions: Gently cleanse wound with antibacterial soap, rinse and pat dry prior to dressing wounds Primary Dressing: Hydrofera Blue Ready Transfer Foam, 2.5x2.5 (in/in) Every Other Day/30 Days Discharge Instructions: Apply Hydrofera Blue Ready to wound bed as directed Secondary Dressing: Gauze Every Other Day/30 Days Discharge Instructions: As directed: dry, moistened with saline or moistened with Dakins Solution Secured With: Conform 4'' - Conforming Stretch Gauze Bandage 4x75 (in/in) Every Other Day/30 Days Discharge Instructions: Apply as directed 1. Hydrofera Blue 2. Aggressive offloading defender boot 3. Follow-up in 1 week Electronic Signature(s) Signed: 06/01/2022 10:27:22 AM By: Geralyn Corwin DO Entered By: Geralyn Corwin on 06/01/2022 09:18:36 [name] ([id number]) -------------------------------------------------------------------------------- SuperBill Details Patient Name: [name] Date of Service: 06/01/2022 Medical Record Number: [id number] Patient Account Number: [id number] Date of Birth/Sex: 04/08/1956 (65 y.o. M) Treating RN: Angelina Pih Primary Care  Provider: Manson Allan Other Clinician: Referring Provider: Manson Allan Treating Provider/Extender: Tilda Franco in Treatment: 29 Diagnosis Coding ICD-10 Codes Code Description [phone] 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ed E11.621 Type 2 diabetes mellitus with foot ulcer E11.40 Type 2 diabetes mellitus with diabetic neuropathy, unspecified R26.89 Other abnormalities of gait and mobility Facility Procedures CPT4 Code: 21308657 Description: 99213 - WOUND CARE VISIT-LEV 3 EST PT Modifier: Quantity: 1 Physician Procedures CPT4 Code: 8469629 Description: 99213 - WC PHYS LEVEL 3 - EST PT Modifier: Quantity: 1 CPT4 Code: Description: ICD-10 Diagnosis Description L97.512 Non-pressure chronic ulcer of other part of right foot with fat layer e E11.621 Type 2 diabetes mellitus with foot ulcer E11.40 Type 2 diabetes mellitus with diabetic neuropathy, unspecified R26.89 Other  abnormalities of gait and mobility Modifier: xposed Quantity: Electronic Signature(s) Signed: 06/01/2022 10:27:22 AM By: Geralyn Corwin DO Entered By: Geralyn Corwin on 06/01/2022 09:18:47

## 2022-06-01 NOTE — Progress Notes (Signed)
[name] ([id number]) Visit Report for 06/01/2022 Arrival Information Details Patient Name: [name], [name] Date of Service: 06/01/2022 8:30 AM Medical Record Number: [id number] Patient Account Number: [id number] Date of Birth/Sex: 12-Mar-1956 (65 y.o. M) Treating RN: [name] Primary Care [name]: [name] Other Clinician: Referring [name]: [name] Treating [name]/Extender: [name] in Treatment: 29 Visit Information History Since Last Visit Added or deleted any medications: No Patient Arrived: Ambulatory Any new allergies or adverse reactions: No Arrival Time: 08:26 Had a fall or experienced change in No Accompanied By: self activities of daily living that may affect Transfer Assistance: None risk of falls: Patient Identification Verified: Yes Hospitalized since last visit: No Secondary Verification Process Completed: Yes Has Dressing in Place as Prescribed: Yes Patient Requires Transmission-Based No Pain Present Now: No Precautions: Patient Has Alerts: Yes Patient Alerts: Patient on Blood Thinner DIABETIC Plavix Electronic Signature(s) Signed: 06/01/2022 4:19:14 PM By: [name] Entered By: [name] on 06/01/2022 08:31:36 [name] ([id number]) -------------------------------------------------------------------------------- Clinic Level of Care Assessment Details Patient Name: [name] Date of Service: 06/01/2022 8:30 AM Medical Record Number: [id number] Patient Account Number: [id number] Date of Birth/Sex: 05-27-1956 (65 y.o. M) Treating RN: [name] Primary Care Lilliam Chamblee: [name] Other Clinician: Referring [name]: [name] Treating Ayinde Swim/Extender: [name] in Treatment: 29 Clinic Level of Care Assessment Items TOOL 4 Quantity Score []  - Use when only an EandM is performed on FOLLOW-UP visit 0 ASSESSMENTS - Nursing Assessment / Reassessment X - Reassessment of  Co-morbidities (includes updates in patient status) 1 10 X- 1 5 Reassessment of Adherence to Treatment Plan ASSESSMENTS - Wound and Skin Assessment / Reassessment X - Simple Wound Assessment / Reassessment - one wound 1 5 []  - 0 Complex Wound Assessment / Reassessment - multiple wounds []  - 0 Dermatologic / Skin Assessment (not related to wound area) ASSESSMENTS - Focused Assessment []  - Circumferential Edema Measurements - multi extremities 0 []  - 0 Nutritional Assessment / Counseling / Intervention []  - 0 Lower Extremity Assessment (monofilament, tuning fork, pulses) []  - 0 Peripheral Arterial Disease Assessment (using hand held doppler) ASSESSMENTS - Ostomy and/or Continence Assessment and Care []  - Incontinence Assessment and Management 0 []  - 0 Ostomy Care Assessment and Management (repouching, etc.) PROCESS - Coordination of Care X - Simple Patient / Family Education for ongoing care 1 15 []  - 0 Complex (extensive) Patient / Family Education for ongoing care []  - 0 Staff obtains Programmer, systems, Records, Test Results / Process Orders []  - 0 Staff telephones HHA, Nursing Homes / Clarify orders / etc []  - 0 Routine Transfer to another Facility (non-emergent condition) []  - 0 Routine Hospital Admission (non-emergent condition) []  - 0 New Admissions / Biomedical engineer / Ordering NPWT, Apligraf, etc. []  - 0 Emergency Hospital Admission (emergent condition) X- 1 10 Simple Discharge Coordination []  - 0 Complex (extensive) Discharge Coordination PROCESS - Special Needs []  - Pediatric / Minor Patient Management 0 []  - 0 Isolation Patient Management []  - 0 Hearing / Language / Visual special needs []  - 0 Assessment of Community assistance (transportation, D/C planning, etc.) []  - 0 Additional assistance / Altered mentation []  - 0 Support Surface(s) Assessment (bed, cushion, seat, etc.) INTERVENTIONS - Wound Cleansing / Measurement [name] ([id number]) X- 1  5 Simple Wound Cleansing - one wound []  - 0 Complex Wound Cleansing - multiple wounds X- 1 5 Wound Imaging (photographs - any number of wounds) []  - 0 Wound Tracing (instead of photographs) X- 1 5 Simple Wound  Measurement - one wound []  - 0 Complex Wound Measurement - multiple wounds INTERVENTIONS - Wound Dressings X - Small Wound Dressing one or multiple wounds 1 10 []  - 0 Medium Wound Dressing one or multiple wounds []  - 0 Large Wound Dressing one or multiple wounds X- 1 5 Application of Medications - topical []  - 0 Application of Medications - injection INTERVENTIONS - Miscellaneous []  - External ear exam 0 []  - 0 Specimen Collection (cultures, biopsies, blood, body fluids, etc.) []  - 0 Specimen(s) / Culture(s) sent or taken to Lab for analysis []  - 0 Patient Transfer (multiple staff / Civil Service fast streamer / Similar devices) []  - 0 Simple Staple / Suture removal (25 or less) []  - 0 Complex Staple / Suture removal (26 or more) []  - 0 Hypo / Hyperglycemic Management (close monitor of Blood Glucose) []  - 0 Ankle / Brachial Index (ABI) - do not check if billed separately X- 1 5 Vital Signs Has the patient been seen at the hospital within the last three years: Yes Total Score: 80 Level Of Care: New/Established - Level 3 Electronic Signature(s) Signed: 06/01/2022 4:19:14 PM By: [name] Entered By: [name] on 06/01/2022 08:57:07 [name] ([id number]) -------------------------------------------------------------------------------- Encounter Discharge Information Details Patient Name: [name] Date of Service: 06/01/2022 8:30 AM Medical Record Number: [id number] Patient Account Number: [id number] Date of Birth/Sex: [birth year] (65 y.o. M) Treating RN: [name] Primary Care [name]: [name] Other Clinician: Referring [name]: [name] Treating [name]/Extender: [name] in Treatment: 49 Encounter Discharge Information  Items Discharge Condition: Stable Ambulatory Status: Ambulatory Discharge Destination: Home Transportation: Private Auto Accompanied By: self Schedule Follow-up Appointment: Yes Clinical Summary of Care: Electronic Signature(s) Signed: 06/01/2022 4:19:14 PM By: [name] Entered By: [name] on 06/01/2022 08:57:48 [name] ([id number]) -------------------------------------------------------------------------------- Lower Extremity Assessment Details Patient Name: [name] Date of Service: 06/01/2022 8:30 AM Medical Record Number: [id number] Patient Account Number: [id number] Date of Birth/Sex: September 17, 1956 (65 y.o. M) Treating RN: [name] Primary Care Hiran Leard: [name] Other Clinician: Referring Edder Bellanca: [name] Treating Taisley Mordan/Extender: [name] in Treatment: 29 Edema Assessment Assessed: [Left: No] [Right: No] Edema: [Left: N] [Right: o] Vascular Assessment Pulses: Dorsalis Pedis Palpable: [Right:Yes] Electronic Signature(s) Signed: 06/01/2022 4:19:14 PM By: [name] Entered By: [name] on 06/01/2022 08:41:09 [name] ([id number]) -------------------------------------------------------------------------------- Multi Wound Chart Details Patient Name: [name] Date of Service: 06/01/2022 8:30 AM Medical Record Number: [id number] Patient Account Number: [id number] Date of Birth/Sex: [birth year] (65 y.o. M) Treating RN: [name] Primary Care Jameila Keeny: [name] Other Clinician: Referring Kamori Kitchens: [name] Treating Brooklynn Brandenburg/Extender: [name] in Treatment: 29 Vital Signs Height(in): 74 Pulse(bpm): 65 Weight(lbs): 226 Blood Pressure(mmHg): 116/70 Body Mass Index(BMI): 29 Temperature(F): 97.6 Respiratory Rate(breaths/min): 18 Photos: [N/A:N/A] Wound Location: Right, Plantar Foot N/A N/A Wounding Event: Blister N/A N/A Primary Etiology: Diabetic Wound/Ulcer  of the Lower N/A N/A Extremity Comorbid History: Type II Diabetes, Osteoarthritis N/A N/A Date Acquired: 07/15/2020 N/A N/A Weeks of Treatment: 29 N/A N/A Wound Status: Open N/A N/A Wound Recurrence: No N/A N/A Measurements L x W x D (cm) 0.8x0.5x0.5 N/A N/A Area (cm) : 0.314 N/A N/A Volume (cm) : 0.157 N/A N/A % Reduction in Area: 72.00% N/A N/A % Reduction in Volume: 76.70% N/A N/A Classification: Grade 2 N/A N/A Exudate Amount: Medium N/A N/A Exudate Type: Serosanguineous N/A N/A Exudate Color: red, brown N/A N/A Wound Margin: Thickened N/A N/A Granulation Amount: Large (67-100%) N/A N/A Granulation Quality: Pink N/A N/A Necrotic Amount:  Small (1-33%) N/A N/A Exposed Structures: Fat Layer (Subcutaneous Tissue): N/A N/A Yes Fascia: No Tendon: No Muscle: No Joint: No Bone: No Epithelialization: None N/A N/A Treatment Notes Electronic Signature(s) Signed: 06/01/2022 4:19:14 PM By: [name] Entered By: [name] on 06/01/2022 08:51:03 [name] ([id number]) -------------------------------------------------------------------------------- Multi-Disciplinary Care Plan Details Patient Name: [name] Date of Service: 06/01/2022 8:30 AM Medical Record Number: [id number] Patient Account Number: [id number] Date of Birth/Sex: [birth year] (65 y.o. M) Treating RN: [name] Primary Care Zaniya Mcaulay: [name] Other Clinician: Referring Matilda Fleig: [name] Treating Danyale Ridinger/Extender: [name] in Treatment: 70 Active Inactive Wound/Skin Impairment Nursing Diagnoses: Impaired tissue integrity Knowledge deficit related to smoking impact on wound healing Knowledge deficit related to ulceration/compromised skin integrity Goals: Patient/caregiver will verbalize understanding of skin care regimen Date Initiated: 11/10/2021 Date Inactivated: 12/01/2021 Target Resolution Date: 11/17/2021 Goal Status: Met Ulcer/skin breakdown will have a  volume reduction of 30% by week 4 Date Initiated: 11/10/2021 Date Inactivated: 02/02/2022 Target Resolution Date: 12/08/2021 Goal Status: Met Ulcer/skin breakdown will have a volume reduction of 50% by week 8 Date Initiated: 11/10/2021 Target Resolution Date: 01/05/2022 Goal Status: Active Ulcer/skin breakdown will have a volume reduction of 80% by week 12 Date Initiated: 11/10/2021 Target Resolution Date: 02/02/2022 Goal Status: Active Ulcer/skin breakdown will heal within 14 weeks Date Initiated: 11/10/2021 Target Resolution Date: 02/16/2022 Goal Status: Active Interventions: Assess patient/caregiver ability to obtain necessary supplies Assess patient/caregiver ability to perform ulcer/skin care regimen upon admission and as needed Assess ulceration(s) every visit Notes: Electronic Signature(s) Signed: 06/01/2022 4:19:14 PM By: [name] Entered By: [name] on 06/01/2022 08:50:44 [name] ([id number]) -------------------------------------------------------------------------------- Pain Assessment Details Patient Name: [name] Date of Service: 06/01/2022 8:30 AM Medical Record Number: [id number] Patient Account Number: [id number] Date of Birth/Sex: 1956/10/25 (65 y.o. M) Treating RN: [name] Primary Care Stacia Feazell: [name] Other Clinician: Referring Kailer Heindel: [name] Treating Azan Maneri/Extender: [name] in Treatment: 29 Active Problems Location of Pain Severity and Description of Pain Patient Has Paino No Site Locations Rate the pain. Current Pain Level: 0 Pain Management and Medication Current Pain Management: Electronic Signature(s) Signed: 06/01/2022 4:19:14 PM By: [name] Entered By: [name] on 06/01/2022 08:37:12 [name] ([id number]) -------------------------------------------------------------------------------- Patient/Caregiver Education Details Patient Name: [name] Date of Service:  06/01/2022 8:30 AM Medical Record Number: [id number] Patient Account Number: [id number] Date of Birth/Gender: October 16, 1956 (65 y.o. M) Treating RN: [name] Primary Care Physician: [name] Other Clinician: Referring Physician: Lang [name] Treating Physician/Extender: [name] in Treatment: 72 Education Assessment Education Provided To: Patient Education Topics Provided Wound/Skin Impairment: Handouts: Caring for Your Ulcer Methods: Explain/Verbal Responses: State content correctly Electronic Signature(s) Signed: 06/01/2022 4:19:14 PM By: [name] Entered By: [name] on 06/01/2022 08:57:21 [name] ([id number]) -------------------------------------------------------------------------------- Wound Assessment Details Patient Name: [name] Date of Service: 06/01/2022 8:30 AM Medical Record Number: [id number] Patient Account Number: [id number] Date of Birth/Sex: 22-Jun-1956 (65 y.o. M) Treating RN: [name] Primary Care Addilynne Olheiser: [name] Other Clinician: Referring Irene Mitcham: [name] Treating Philana Younis/Extender: [name] in Treatment: 29 Wound Status Wound Number: 1 Primary Etiology: Diabetic Wound/Ulcer of the Lower Extremity Wound Location: Right, Plantar Foot Wound Status: Open Wounding Event: Blister Comorbid History: Type II Diabetes, Osteoarthritis Date Acquired: 07/15/2020 Weeks Of Treatment: 29 Clustered Wound: No Photos Wound Measurements Length: (cm) 0.8 Width: (cm) 0.5 Depth: (cm) 0.5 Area: (cm) 0.314 Volume: (cm) 0.157 % Reduction in Area: 72% % Reduction in Volume: 76.7% Epithelialization: None Tunneling: No Undermining:  No Wound Description Classification: Grade 2 Wound Margin: Thickened Exudate Amount: Medium Exudate Type: Serosanguineous Exudate Color: red, brown Foul Odor After Cleansing: No Slough/Fibrino Yes Wound Bed Granulation Amount: Large (67-100%) Exposed  Structure Granulation Quality: Pink Fascia Exposed: No Necrotic Amount: Small (1-33%) Fat Layer (Subcutaneous Tissue) Exposed: Yes Necrotic Quality: Adherent Slough Tendon Exposed: No Muscle Exposed: No Joint Exposed: No Bone Exposed: No Treatment Notes Wound #1 (Foot) Wound Laterality: Plantar, Right Cleanser Normal Saline Discharge Instruction: Wash your hands with soap and water. Remove old dressing, discard into plastic bag and place into trash. Cleanse the wound with Normal Saline prior to applying a clean dressing using gauze sponges, not tissues or cotton balls. Do not scrub or use excessive force. Pat dry using gauze sponges, not tissue or cotton balls. DEWAN, EMOND ([id number]) Soap and Water Discharge Instruction: Gently cleanse wound with antibacterial soap, rinse and pat dry prior to dressing wounds Peri-Wound Care Topical Primary Dressing Hydrofera Blue Ready Transfer Foam, 2.5x2.5 (in/in) Discharge Instruction: Apply Hydrofera Blue Ready to wound bed as directed Secondary Dressing Gauze Discharge Instruction: As directed: dry, moistened with saline or moistened with Dakins Solution Secured With Conform 4'' - Conforming Stretch Gauze Bandage 4x75 (in/in) Discharge Instruction: Apply as directed Compression Wrap Compression Stockings Add-Ons Electronic Signature(s) Signed: 06/01/2022 4:19:14 PM By: [name] Entered By: [name] on 06/01/2022 08:40:52 [name] ([id number]) -------------------------------------------------------------------------------- Goodman Details Patient Name: [name] Date of Service: 06/01/2022 8:30 AM Medical Record Number: [id number] Patient Account Number: [id number] Date of Birth/Sex: September 24, 1956 (65 y.o. M) Treating RN: [name] Primary Care Lucylle Foulkes: [name] Other Clinician: Referring Trentan Trippe: [name] Treating [name]/Extender: [name] in Treatment: 29 Vital Signs Time  Taken: 08:32 Temperature (F): 97.6 Height (in): 74 Pulse (bpm): 65 Weight (lbs): 226 Respiratory Rate (breaths/min): 18 Body Mass Index (BMI): 29 Blood Pressure (mmHg): 116/70 Reference Range: 80 - 120 mg / dl Electronic Signature(s) Signed: 06/01/2022 4:19:14 PM By: [name] Entered By: [name] on 06/01/2022 08:37:01

## 2022-06-01 NOTE — Progress Notes (Signed)
Patient presents to the office today for diabetic shoe and insole measuring.  Patient was measured with brannock device to determine size and width for 1 pair of extra depth shoes and foam casted for 3 pair of insoles.   Documentation of medical necessity will be sent to patient's treating diabetic doctor to verify and sign.   Patient's diabetic provider: Manson Allan, NP  Shoes and insoles will be ordered at that time and patient will be notified for an appointment for fitting when they arrive.   Shoe size (per patient): 13 wide   Brannock measurement: 13 wide men's  Patient shoe selection-   1st choice:   [id number] Apex [place]  2nd choice:  V753 Apex [place]  Shoe size ordered: 13 wide men's

## 2022-06-02 NOTE — Discharge Instructions (Signed)

## 2022-06-07 ENCOUNTER — Ambulatory Visit
Admission: RE | Admit: 2022-06-07 | Discharge: 2022-06-07 | Disposition: A | Discharge status: Home / Self Care | Payer: Medicare Other | Attending: Ophthalmology | Admitting: Ophthalmology

## 2022-06-07 ENCOUNTER — Ambulatory Visit: Payer: Medicare Other | Admitting: Anesthesiology

## 2022-06-07 ENCOUNTER — Encounter: Payer: Self-pay | Admitting: Ophthalmology

## 2022-06-07 ENCOUNTER — Encounter
Admission: RE | Disposition: A | Discharge status: Home / Self Care | Payer: Self-pay | Source: Home / Self Care | Attending: Ophthalmology

## 2022-06-07 ENCOUNTER — Ambulatory Visit (AMBULATORY_SURGERY_CENTER): Payer: Medicare Other | Admitting: Anesthesiology

## 2022-06-07 ENCOUNTER — Other Ambulatory Visit: Payer: Self-pay

## 2022-06-07 DIAGNOSIS — I10 Essential (primary) hypertension: Secondary | ICD-10-CM | POA: Insufficient documentation

## 2022-06-07 DIAGNOSIS — F32A Depression, unspecified: Secondary | ICD-10-CM | POA: Diagnosis not present

## 2022-06-07 DIAGNOSIS — E119 Type 2 diabetes mellitus without complications: Secondary | ICD-10-CM | POA: Diagnosis not present

## 2022-06-07 DIAGNOSIS — H2511 Age-related nuclear cataract, right eye: Secondary | ICD-10-CM

## 2022-06-07 DIAGNOSIS — N289 Disorder of kidney and ureter, unspecified: Secondary | ICD-10-CM | POA: Insufficient documentation

## 2022-06-07 DIAGNOSIS — Z7984 Long term (current) use of oral hypoglycemic drugs: Secondary | ICD-10-CM | POA: Insufficient documentation

## 2022-06-07 DIAGNOSIS — E1136 Type 2 diabetes mellitus with diabetic cataract: Secondary | ICD-10-CM | POA: Insufficient documentation

## 2022-06-07 DIAGNOSIS — Z87891 Personal history of nicotine dependence: Secondary | ICD-10-CM

## 2022-06-07 DIAGNOSIS — Z8673 Personal history of transient ischemic attack (TIA), and cerebral infarction without residual deficits: Secondary | ICD-10-CM | POA: Diagnosis not present

## 2022-06-07 HISTORY — PX: CATARACT EXTRACTION W/PHACO: SHX586

## 2022-06-07 LAB — GLUCOSE, CAPILLARY: Glucose-Capillary: 225 mg/dL — ABNORMAL HIGH (ref 70–99)

## 2022-06-07 SURGERY — PHACOEMULSIFICATION, CATARACT, WITH IOL INSERTION
Anesthesia: Monitor Anesthesia Care | Site: Eye | Laterality: Right

## 2022-06-07 MED ORDER — FENTANYL CITRATE (PF) 100 MCG/2ML IJ SOLN
INTRAMUSCULAR | Status: DC | PRN
  Administered 2022-06-07: 50 ug via INTRAVENOUS

## 2022-06-07 MED ORDER — SIGHTPATH DOSE#1 BSS IO SOLN
INTRAOCULAR | Status: DC | PRN
  Administered 2022-06-07: 56 mL via OPHTHALMIC

## 2022-06-07 MED ORDER — MOXIFLOXACIN HCL 0.5 % OP SOLN
OPHTHALMIC | Status: DC | PRN
  Administered 2022-06-07: 0.2 mL via OPHTHALMIC

## 2022-06-07 MED ORDER — SIGHTPATH DOSE#1 NA CHONDROIT SULF-NA HYALURON 40-17 MG/ML IO SOLN
INTRAOCULAR | Status: DC | PRN
  Administered 2022-06-07: 1 mL via INTRAOCULAR

## 2022-06-07 MED ORDER — ARMC OPHTHALMIC DILATING DROPS
1.0000 | OPHTHALMIC | Status: DC | PRN
  Administered 2022-06-07 (×3): 1 via OPHTHALMIC

## 2022-06-07 MED ORDER — SIGHTPATH DOSE#1 BSS IO SOLN
INTRAOCULAR | Status: DC | PRN
  Administered 2022-06-07: 1 mL

## 2022-06-07 MED ORDER — SIGHTPATH DOSE#1 BSS IO SOLN
INTRAOCULAR | Status: DC | PRN
  Administered 2022-06-07: 15 mL

## 2022-06-07 MED ORDER — LACTATED RINGERS IV SOLN: INTRAVENOUS | Status: DC

## 2022-06-07 MED ORDER — MIDAZOLAM HCL 2 MG/2ML IJ SOLN
INTRAMUSCULAR | Status: DC | PRN
  Administered 2022-06-07 (×2): 1 mg via INTRAVENOUS

## 2022-06-07 MED ORDER — TETRACAINE HCL 0.5 % OP SOLN
1.0000 [drp] | OPHTHALMIC | Status: DC | PRN
  Administered 2022-06-07 (×3): 1 [drp] via OPHTHALMIC

## 2022-06-07 MED ORDER — BRIMONIDINE TARTRATE-TIMOLOL 0.2-0.5 % OP SOLN
OPHTHALMIC | Status: DC | PRN
  Administered 2022-06-07: 1 [drp] via OPHTHALMIC

## 2022-06-07 SURGICAL SUPPLY — 16 items
CANNULA ANT/CHMB 27G (MISCELLANEOUS) IMPLANT
CANNULA ANT/CHMB 27GA (MISCELLANEOUS) IMPLANT
CATARACT SUITE SIGHTPATH (MISCELLANEOUS) ×2 IMPLANT
FEE CATARACT SUITE SIGHTPATH (MISCELLANEOUS) ×1 IMPLANT
GLOVE SURG ENC TEXT LTX SZ8 (GLOVE) ×2 IMPLANT
GLOVE SURG TRIUMPH 8.0 PF LTX (GLOVE) ×2 IMPLANT
LENS IOL TECNIS EYHANCE 16.5 (Intraocular Lens) ×1 IMPLANT
NDL FILTER BLUNT 18X1 1/2 (NEEDLE) ×1 IMPLANT
NEEDLE FILTER BLUNT 18X 1/2SAF (NEEDLE) ×1
NEEDLE FILTER BLUNT 18X1 1/2 (NEEDLE) ×1 IMPLANT
PACK VIT ANT 23G (MISCELLANEOUS) IMPLANT
RING MALYGIN (MISCELLANEOUS) IMPLANT
SUT ETHILON 10-0 CS-B-6CS-B-6 (SUTURE)
SUTURE EHLN 10-0 CS-B-6CS-B-6 (SUTURE) IMPLANT
SYR 3ML LL SCALE MARK (SYRINGE) ×2 IMPLANT
WATER STERILE IRR 250ML POUR (IV SOLUTION) ×2 IMPLANT

## 2022-06-07 NOTE — Op Note (Signed)
PREOPERATIVE DIAGNOSIS:  Nuclear sclerotic cataract of the right eye.   POSTOPERATIVE DIAGNOSIS:  Cataract   OPERATIVE PROCEDURE:ORPROCALL@   SURGEON:  [name] Manila, MD.   ANESTHESIA:  Anesthesiologist: Yevette Edwards, MD CRNA: Karoline Caldwell, CRNA  1.      Managed anesthesia care. 2.      0.61ml of Shugarcaine was instilled in the eye following the paracentesis.   COMPLICATIONS:  None.   TECHNIQUE:   Stop and chop   DESCRIPTION OF PROCEDURE:  The patient was examined and consented in the preoperative holding area where the aforementioned topical anesthesia was applied to the right eye and then brought back to the Operating Room where the right eye was prepped and draped in the usual sterile ophthalmic fashion and a lid speculum was placed. A paracentesis was created with the side port blade and the anterior chamber was filled with viscoelastic. A near clear corneal incision was performed with the steel keratome. A continuous curvilinear capsulorrhexis was performed with a cystotome followed by the capsulorrhexis forceps. Hydrodissection and hydrodelineation were carried out with BSS on a blunt cannula. The lens was removed in a stop and chop  technique and the remaining cortical material was removed with the irrigation-aspiration handpiece. The capsular bag was inflated with viscoelastic and the Technis ZCB00  lens was placed in the capsular bag without complication. The remaining viscoelastic was removed from the eye with the irrigation-aspiration handpiece. The wounds were hydrated. The anterior chamber was flushed with BSS and the eye was inflated to physiologic pressure. 0.55ml of Vigamox was placed in the anterior chamber. The wounds were found to be water tight. The eye was dressed with Combigan. The patient was given protective glasses to wear throughout the day and a shield with which to sleep tonight. The patient was also given drops with which to begin a drop regimen today and will  follow-up with me in one day. Implant Name Type Inv. Item Serial No. Manufacturer Lot No. LRB No. Used Action  LENS IOL TECNIS EYHANCE 16.5 - [id number] Intraocular Lens LENS IOL TECNIS EYHANCE 16.5 [id number] [employer]  Right 1 Implanted   Procedure(s) with comments: CATARACT EXTRACTION PHACO AND INTRAOCULAR LENS PLACEMENT (IOC) RIGHT (Right) - 7.33 0:45.3  Electronically signed: Galen [name] 06/07/2022 1:03 PM

## 2022-06-07 NOTE — H&P (Signed)
[facility]   Primary Care Physician:  Alm Bustard, NP Ophthalmologist: Dr. Timmothy Sours  Pre-Procedure History & Physical: HPI:  [name] is a 66 y.o. male here for cataract surgery.   Past Medical History:  Diagnosis Date   Diabetes mellitus without complication (HCC)    Gout     Past Surgical History:  Procedure Laterality Date   AMPUTATION TOE Left 12/16/2021   Procedure: AMPUTATION TOE - Left Great;  Surgeon: Linus Galas, DPM;  Location: [facility];  Service: Podiatry;  Laterality: Left;   AMPUTATION TOE Left 01/14/2022   Procedure: GREAT TOE AMPUTATION;  Surgeon: Linus Galas, DPM;  Location: [facility];  Service: Podiatry;  Laterality: Left;   CATARACT EXTRACTION W/PHACO Left 05/24/2022   Procedure: CATARACT EXTRACTION PHACO AND INTRAOCULAR LENS PLACEMENT (IOC) LEFT 7.63 00:42.1;  Surgeon: Galen Manila, MD;  Location: [facility];  Service: Ophthalmology;  Laterality: Left;  Diabetic   LAPAROSCOPIC GASTRIC BANDING      Prior to Admission medications   Medication Sig Start Date End Date Taking? Authorizing Provider  allopurinol (ZYLOPRIM) 300 MG tablet Take 300 mg by mouth 2 (two) times daily.   Yes [provider]  atorvastatin (LIPITOR) 80 MG tablet Take 1 tablet (80 mg total) by mouth daily. 12/07/21 06/07/22 Yes Darlin Priestly, MD  buPROPion (WELLBUTRIN XL) 150 MG 24 hr tablet Take 150 mg by mouth daily.   Yes [provider]  gabapentin (NEURONTIN) 300 MG capsule Take 3 capsules (900 mg total) by mouth 3 (three) times daily. 12/17/21  Yes Rodolph Bong, MD  metFORMIN (GLUCOPHAGE) 500 MG tablet Take by mouth 2 (two) times daily with a meal.   Yes [provider]  Semaglutide 7 MG TABS Take 14 mg by mouth daily.   Yes [provider]  HYDROcodone-acetaminophen (NORCO/VICODIN) 5-325 MG tablet Take 1-2 tablets by mouth every 6 (six) hours as needed for severe pain. Patient not taking: Reported on 05/12/2022 01/16/22   Osvaldo Shipper, MD  lisinopril (ZESTRIL) 10 MG tablet Take 0.5 tablets (5 mg total) by mouth daily. Patient not taking: Reported on 05/12/2022 12/20/21   Rodolph Bong, MD  polyethylene glycol (MIRALAX / GLYCOLAX) 17 g packet Take 17 g by mouth daily as needed for moderate constipation. 01/16/22   Osvaldo Shipper, MD  testosterone cypionate (DEPOTESTOSTERONE CYPIONATE) 200 MG/ML injection Inject into the muscle every 14 (fourteen) days.    [provider]    Allergies as of 04/08/2022   (No Known Allergies)    History reviewed. No pertinent family history.  Social History   Socioeconomic History   Marital status: Single    Spouse name: Not on file   Number of children: Not on file   Years of education: Not on file   Highest education level: Not on file  Occupational History   Not on file  Tobacco Use   Smoking status: Former    Types: Cigarettes    Quit date: 10/07/1997    Years since quitting: 24.6   Smokeless tobacco: Never  Vaping Use   Vaping Use: Never used  Substance and Sexual Activity   Alcohol use: Never   Drug use: Never   Sexual activity: Not on file  Other Topics Concern   Not on file  Social History Narrative   Not on file   Social Determinants of Health   Financial Resource Strain: Not on file  Food Insecurity: Not on file  Transportation Needs: Not on file  Physical Activity: Not on file  Stress: Not on file  Social Connections: Not on file  Intimate Partner Violence: Not on file    Review of Systems: See HPI, otherwise negative ROS  Physical Exam: BP 139/80   Pulse 96   Temp 97.6 F (36.4 C) (Temporal)   Wt 103.9 kg   SpO2 98%   BMI 29.40 kg/m  General:   Alert, cooperative in NAD Head:  Normocephalic and atraumatic. Respiratory:  Normal work of breathing. Cardiovascular:  RRR  Impression/Plan: [name] is here for cataract surgery.  Risks, benefits, limitations, and alternatives regarding cataract surgery have been reviewed  with the patient.  Questions have been answered.  All parties agreeable.   Galen Manila, MD  06/07/2022, 12:32 PM

## 2022-06-07 NOTE — Anesthesia Preprocedure Evaluation (Signed)
Anesthesia Evaluation  Patient identified by MRN, date of birth, ID band Patient awake    Reviewed: Allergy & Precautions, H&P , NPO status , Patient's Chart, lab work & pertinent test results, reviewed documented beta blocker date and time   Airway Mallampati: II  TM Distance: >3 FB Neck ROM: full    Dental no notable dental hx. (+) Teeth Intact   Pulmonary neg pulmonary ROS, former smoker,    Pulmonary exam normal breath sounds clear to auscultation       Cardiovascular Exercise Tolerance: Good hypertension, On Medications negative cardio ROS   Rhythm:regular Rate:Normal     Neuro/Psych PSYCHIATRIC DISORDERS Depression CVA    GI/Hepatic negative GI ROS, Neg liver ROS,   Endo/Other  negative endocrine ROSdiabetes, Well Controlled  Renal/GU Renal disease     Musculoskeletal   Abdominal   Peds  Hematology negative hematology ROS (+)   Anesthesia Other Findings   Reproductive/Obstetrics negative OB ROS                             Anesthesia Physical Anesthesia Plan  ASA: 3  Anesthesia Plan: MAC   Post-op Pain Management:    Induction:   PONV Risk Score and Plan:   Airway Management Planned:   Additional Equipment:   Intra-op Plan:   Post-operative Plan:   Informed Consent: I have reviewed the patients History and Physical, chart, labs and discussed the procedure including the risks, benefits and alternatives for the proposed anesthesia with the patient or authorized representative who has indicated his/her understanding and acceptance.       Plan Discussed with: CRNA  Anesthesia Plan Comments:         Anesthesia Quick Evaluation

## 2022-06-07 NOTE — Anesthesia Postprocedure Evaluation (Signed)
Anesthesia Post Note  Patient: [name]  Procedure(s) Performed: CATARACT EXTRACTION PHACO AND INTRAOCULAR LENS PLACEMENT (IOC) RIGHT (Right: Eye)     Patient location during evaluation: PACU Anesthesia Type: MAC Level of consciousness: awake and alert Pain management: pain level controlled Vital Signs Assessment: post-procedure vital signs reviewed and stable Respiratory status: spontaneous breathing, nonlabored ventilation, respiratory function stable and patient connected to nasal cannula oxygen Cardiovascular status: stable and blood pressure returned to baseline Postop Assessment: no apparent nausea or vomiting Anesthetic complications: no   No notable events documented.  Hedda Slade

## 2022-06-07 NOTE — Transfer of Care (Signed)
Immediate Anesthesia Transfer of Care Note  Patient: [name]  Procedure(s) Performed: CATARACT EXTRACTION PHACO AND INTRAOCULAR LENS PLACEMENT (IOC) RIGHT (Right: Eye)  Patient Location: PACU  Anesthesia Type: MAC  Level of Consciousness: awake, alert  and patient cooperative  Airway and Oxygen Therapy: Patient Spontanous Breathing and Patient connected to supplemental oxygen  Post-op Assessment: Post-op Vital signs reviewed, Patient's Cardiovascular Status Stable, Respiratory Function Stable, Patent Airway and No signs of Nausea or vomiting  Post-op Vital Signs: Reviewed and stable  Complications: No notable events documented.

## 2022-06-08 ENCOUNTER — Encounter (HOSPITAL_BASED_OUTPATIENT_CLINIC_OR_DEPARTMENT_OTHER): Payer: Medicare Other | Admitting: Internal Medicine

## 2022-06-08 ENCOUNTER — Encounter: Payer: Self-pay | Admitting: Ophthalmology

## 2022-06-08 DIAGNOSIS — L97512 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ed: Secondary | ICD-10-CM | POA: Diagnosis not present

## 2022-06-08 DIAGNOSIS — E1140 Type 2 diabetes mellitus with diabetic neuropathy, unspecified: Secondary | ICD-10-CM | POA: Diagnosis not present

## 2022-06-08 DIAGNOSIS — E11621 Type 2 diabetes mellitus with foot ulcer: Secondary | ICD-10-CM

## 2022-06-08 NOTE — Progress Notes (Signed)
[name] ([id number]) Visit Report for 06/08/2022 Chief Complaint Document Details Patient Name: [name] Date of Service: 06/08/2022 8:30 AM Medical Record Number: [id number] Patient Account Number: [id number] Date of Birth/Sex: January 07, 1956 (65 y.o. M) Treating RN: Angelina Pih Primary Care Provider: Manson Allan Other Clinician: Referring Provider: Manson Allan Treating Provider/Extender: Tilda Franco in Treatment: 30 Information Obtained from: Patient Chief Complaint Right plantar foot wound Electronic Signature(s) Signed: 06/08/2022 9:25:36 AM By: Geralyn Corwin DO Entered By: Geralyn Corwin on 06/08/2022 09:04:21 [name] ([id number]) -------------------------------------------------------------------------------- Debridement Details Patient Name: [name] Date of Service: 06/08/2022 8:30 AM Medical Record Number: [id number] Patient Account Number: [id number] Date of Birth/Sex: 08-31-1956 (65 y.o. M) Treating RN: Angelina Pih Primary Care Provider: Manson Allan Other Clinician: Referring Provider: Manson Allan Treating Provider/Extender: Tilda Franco in Treatment: 30 Debridement Performed for Wound #1 Right,Plantar Foot Assessment: Performed By: Physician Geralyn Corwin, MD Debridement Type: Debridement Severity of Tissue Pre Debridement: Fat layer exposed Level of Consciousness (Pre- Awake and Alert procedure): Pre-procedure Verification/Time Out Yes - 08:56 Taken: Total Area Debrided (L x W): 1.2 (cm) x 0.8 (cm) = 0.96 (cm) Tissue and other material Non-Viable, Callus, Slough, Subcutaneous, Slough debrided: Level: Skin/Subcutaneous Tissue Debridement Description: Excisional Instrument: Curette Bleeding: Minimum Hemostasis Achieved: Pressure Response to Treatment: Procedure was tolerated well Level of Consciousness (Post- Awake and Alert procedure): Post Debridement Measurements of Total Wound Length:  (cm) 1.2 Width: (cm) 0.8 Depth: (cm) 0.7 Volume: (cm) 0.528 Character of Wound/Ulcer Post Debridement: Stable Severity of Tissue Post Debridement: Fat layer exposed Post Procedure Diagnosis Same as Pre-procedure Electronic Signature(s) Signed: 06/08/2022 9:25:36 AM By: Geralyn Corwin DO Signed: 06/08/2022 3:54:56 PM By: Angelina Pih Entered By: Angelina Pih on 06/08/2022 08:57:47 [name] ([id number]) -------------------------------------------------------------------------------- HPI Details Patient Name: [name] Date of Service: 06/08/2022 8:30 AM Medical Record Number: [id number] Patient Account Number: [id number] Date of Birth/Sex: 06/05/1956 (65 y.o. M) Treating RN: Angelina Pih Primary Care Provider: Manson Allan Other Clinician: Referring Provider: Manson Allan Treating Provider/Extender: Tilda Franco in Treatment: 30 History of Present Illness HPI Description: Admission 11/10/2021 [name] is a 66 year old male with a past medical history of uncontrolled type 2 diabetes with last hemoglobin A1c of 8.1 complicated by peripheral neuropathy that presents to the clinic for a 2-year history of nonhealing ulcer to the bottom of his right foot. He states this started out as a blister caused by a work boot. He reports receiving wound care when he resided in [place]. He is reestablishing his wound care in [facility] today. He is currently keeping the area clean and covered. He has insoles designed to help offload the wound bed. He currently denies signs of infection. 1/18; patient presents for follow-up. He has been using Hydrofera Blue to the wound bed. He has no issues or complaints today. He has not received the defender.[name] He denies signs of infection. 2/1; patient presents for follow-up. He has been using Hydrofera Blue to the wound bed he states he received the defender boot and has been using it however he did not have it on  today. He currently denies signs of infection to the right foot. Unfortunately he developed a wound to the left great toe over the past week. He states he received new orthotics and these caused a blister to the left great toe which turned into a wound. He has not been doing anything to the wound bed. He currently denies systemic signs of infection. 2/8; Patient presents for follow-up. Since last  seen in the clinic he experienced a CVA and was hospitalized for 2 days. 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 He states he is about 90% recovered. He has some mild weakness to the left side. He reports taking the antibiotics prescribed at last clinic visit. He reports using Hydrofera Blue for dressing changes. He only uses the offloading boot when he is at home. He uses open toed shoes to the right foot. He currently denies signs of infection. 2/15; patient presents for follow-up. He states he is still taking the antibiotics prescribed. He reports using the defender boot to the right foot and open toed shoe to the left foot however he is not wearing either today. He currently denies systemic signs of infection. He stated he cut down a tree last week and was outside doing yard work. 2/22; patient presents for follow-up. 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 He reports feeling well. He reports using the defender boot to the right leg and continues to use Hydrofera Blue with dressing changes. He denies systemic signs of infection. 3/8; patient presents for follow-up. He sees Dr. Alberteen Spindle tomorrow for follow-up of the left great toe amputation. This was a partial amputation. He reports he is taking Augmentin currently. He reports increased erythema to the left great toe amputation site. He also reports a decline in his right plantar foot wound. He reports it is draining more. He denies purulent drainage. 3/15; patient presents for follow-up. He  did not see Dr. Alberteen Spindle last week but states he sees him today. He states he has been taking doxycycline in the past week and started gentamicin ointment. He continues to use [facility] with dressing changes. He currently denies systemic signs of infection. 3/22; patient presents for follow-up. Patient had partial amputation of the left great toe on 2/15. 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 Alberteen Spindle. He reports no issues and has no complaints. He is currently taking Keflex and doxycycline prescribed by Dr. Alberteen Spindle. He continues to use gentamicin ointment and Hydrofera Blue to the right foot wound. He reports using the defender boot when he is at home. 3/29; patient presents for follow-up. He continues to be on antibiotics per podiatry. He has been using Hydrofera Blue and gentamicin ointment to the right plantar foot wound. He has no issues or complaints today. He denies signs of infection. 4/5; patient presents for follow-up. He has been using Dakin's wet-to-dry dressings with improvement to wound healing. He denies signs of infection. She has no issues or complaints today. 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 Use Hydrofera Blue for a prolonged period of time and has been using Dakin's wet- to-dry I think for the last 2 to 3 weeks. He has a Psychologist, forensic at home but he does not wear that coming into the clinic because he drives. He tells me he has been compliant with the defender boot short of when he goes out to drive. He lives alone. He also tells me he has had a previous left great toe amputation We are following him for the right foot wound. 4/26; patient presents for follow-up. He had worsening of his left foot wound and had surgical debridement on 4/16 by Dr. Fanny Skates. He is being followed by podiatry for this issue. He he is on IV Unasyn based on tissue culture by ID.[name] We are following him for the right foot  wound. He has been  using Dakin's wet-to-dry dressings without issues. He reports continued usage of the defender boot. 5/3; patient presents for follow-up. He has been using Dakin's wet-to-dry dressings to the right foot wound. He has no issues or complaints today. He reports using the Psychologist, forensic. He reports offloading the foot wound when he is at home by sitting in a recliner. He denies signs of infection. He is still getting IV antibiotics For the left foot wound that is being followed by podiatry. He states the end date is later this month on 5/28. 5/17; patient presents for follow-up. He missed his last clinic appointment. He has been using Dakin's wet-to-dry dressings. He states he is offloading the right foot wound with the defender boot although he does not have this today. He is still on IV antibiotics. He has no issues or complaints today. 5/24; patient presents for follow-up. He has been using Hydrofera Blue to the wound bed without issues. He denies signs of infection. At last clinic [facility], [name] ([id number]) visit he had a wound culture done that detected high levels of Enterococcus faecalis and low levels of Corynebacterium stratum and Staphylococcus epidermidis. Keystone antibiotics were ordered. He has not heard from the company yet. 5/31; patient presents for follow-up. He continues to use Hydrofera Blue to the wound bed. He denies signs of infection. Keystone antibiotics were ordered at last clinic visit and he states he called the company to pay for it. He states that it is being shipped. We rediscussed the total contact cast and he declines having this placed today. He states he is wearing the defender boot however he never wears it into the office. 04-06-2022 upon evaluation today patient appears to be doing about the same in regard to his wound. This is not significantly smaller. He is not wearing his offloading boot. We discussed that today he tells me that he wears it "all the time as he  chuckled and does not have it on during the office visit today. I think it is increasingly obvious that he is just messing around when he says this based on what I am seeing and otherwise I do not know why he would wear it all the time but not to the clinic. Either way my opinion based on what I am seeing currently is simply that the wound does seem to be still having quite a bit of callus buildup around the edges it is also very dry. He does tell me that he has been using the [facility] since he got it and to be honest I think this is good and should hopefully help keep things under control but at the same time I do not see any evidence of active infection. Again the Jodie Echevaria is a topical antibiotic with multiple compounds to help prevent further infection and worsening overall. 6/14; patient presents for follow-up. He has been using Keystone antibiotics with [employer]	[place] daily. He states he is wearing his offloading boot. He does not have this in office today. He is going to the beach for the next 5 days. He states he Will not be using using any offloading device During this time. I again offered the total contact cast however he declined. I did ask him to reconsider for next clinic visit. He currently denies signs of infection. 6/21; patient presents for follow-up. He went to the beach last week. He did not use an offloading device. Wound is slightly deeper today. I again offered the total contact cast but  he declined. Currently denies signs of infection. He reports using the [facility], hydrofera blue and the Psychologist, forensic. 6/28; patient presents for follow-up. Patient declines total contact cast today. He has been using Keystone and [facility] to the wound bed. It is unclear if he is using the Psychologist, forensic. He would like a letter for the post man to delivers mail to his front door so he does not have to walk down the driveway. 7/5; patient presents for follow-up. He states he is considering  the total contact cast however does not want this placed today. He is actually going to the gym after this appointment. He does not want offloading foot wear for this. He has been using Keystone and [facility] to the wound bed. He denies signs of infection. 7/12; patient presents for follow-up. He is still considering the total contact cast however does not want this today. He has been using Keystone antibiotic and Hydrofera Blue to the wound bed. He denies signs of infection. He has no issues or complaints today. 7/26; patient presents for follow-up. He declines a total contact cast. He has been using his an antibiotic and Hydrofera Blue to the wound bed. He denies systemic signs of infection. He is scheduled to discuss orthotics next week with his podiatrist. 8/2; patient presents for follow-up. He has been using Hydrofera Blue to the wound bed. He has no issues or complaints today. He declines a total contact cast. He states he is getting orthotics today. 8/9; patient presents for follow-up. He has been using Hydrofera Blue to the wound bed. He again declines the total contact cast. He does state that he is considering starting it at the end of the month. He has a vacation coming up and does not want to have it in place for that. Electronic Signature(s) Signed: 06/08/2022 9:25:36 AM By: Geralyn Corwin DO Entered By: Geralyn Corwin on 06/08/2022 09:05:40 [name] ([id number]) -------------------------------------------------------------------------------- Physical Exam Details Patient Name: [name] Date of Service: 06/08/2022 8:30 AM Medical Record Number: [id number] Patient Account Number: [id number] Date of Birth/Sex: 1956/01/04 (65 y.o. M) Treating RN: Angelina Pih Primary Care Provider: Manson Allan Other Clinician: Referring Provider: Manson Allan Treating Provider/Extender: Tilda Franco in Treatment:  30 Constitutional . Cardiovascular . Psychiatric . Notes Right foot: To the plantar aspect of the first metatarsal head there is an open wound with pale granulation tissue and significant undermining to the 7o11 o'clock position. No increased warmth, erythema or purulent drainage noted. Electronic Signature(s) Signed: 06/08/2022 9:25:36 AM By: Geralyn Corwin DO Entered By: Geralyn Corwin on 06/08/2022 09:06:22 [name] ([id number]) -------------------------------------------------------------------------------- Physician Orders Details Patient Name: [name] Date of Service: 06/08/2022 8:30 AM Medical Record Number: [id number] Patient Account Number: [id number] Date of Birth/Sex: 06-Aug-1956 (65 y.o. M) Treating RN: Angelina Pih Primary Care Provider: Manson Allan Other Clinician: Referring Provider: Manson Allan Treating Provider/Extender: Tilda Franco in Treatment: 30 Verbal / Phone Orders: No Diagnosis Coding Follow-up Appointments o Return Appointment in 1 week. o Nurse Visit as needed Bathing/ Shower/ Hygiene o May shower; gently cleanse wound with antibacterial soap, rinse and pat dry prior to dressing wounds o No tub bath. Anesthetic (Use 'Patient Medications' Section for Anesthetic Order Entry) o Lidocaine applied to wound bed Edema Control - Lymphedema / Segmental Compressive Device / Other o Elevate, Exercise Daily and Avoid Standing for Long Periods of Time. o Elevate leg(s) parallel to the floor when sitting. o DO YOUR BEST to sleep in the bed at night. DO  NOT sleep in your recliner. Long hours of sitting in a recliner leads to swelling of the legs and/or potential wounds on your backside. Off-Loading o Foot Defender o - right foot-keep shin covered-keep pressure off of the wound (Patient is not wear boot to clinic each week. Wearing tennis shoe) Additional Orders / Instructions o Follow Nutritious Diet and Increase  Protein Intake - monitor blood sugar to maintain normal limits o Other: - Follow up post surgery for left foot-[facility] handling Medications-Please add to medication list. o P.O. Antibiotics - Restart keystone, pick up PO antibiotics please and start Wound Treatment Wound #1 - Foot Wound Laterality: Plantar, Right Cleanser: Dakin 16 (oz) 0.25 Every Other Day/30 Days Discharge Instructions: Use as directed. Topical: keystone Every Other Day/30 Days Primary Dressing: Gauze Every Other Day/30 Days Discharge Instructions: As directed: moistened with Dakins Solution Secondary Dressing: Gauze Every Other Day/30 Days Discharge Instructions: As directed: dry Secured With: Medipore Tape - 52M Medipore H Soft Cloth Surgical Tape, 2x2 (in/yd) Every Other Day/30 Days Secured With: Conform 4'' - Conforming Stretch Gauze Bandage 4x75 (in/in) Every Other Day/30 Days Discharge Instructions: Apply as directed Patient Medications Allergies: No Known Allergies Notifications Medication Indication Start End amoxicillin-pot clavulanate 06/08/2022 DOSE 1 - oral 875 mg-125 mg tablet - 1 tablet oral BID x 14 days doxycycline hyclate 06/08/2022 DOSE 1 - oral 100 mg tablet - 1 tablet oral BID x 14 days [place], [name] ([id number]) Electronic Signature(s) Signed: 06/08/2022 9:25:08 AM By: Geralyn Corwin DO Entered By: Geralyn Corwin on 06/08/2022 09:25:08 [name] ([id number]) -------------------------------------------------------------------------------- Problem List Details Patient Name: [name] Date of Service: 06/08/2022 8:30 AM Medical Record Number: [id number] Patient Account Number: [id number] Date of Birth/Sex: 1956-08-25 (65 y.o. M) Treating RN: Angelina Pih Primary Care Provider: Manson Allan Other Clinician: Referring Provider: Manson Allan Treating Provider/Extender: Tilda Franco in Treatment: 30 Active Problems ICD-10 Encounter Code Description Active Date  MDM Diagnosis L97.512 Non-pressure chronic ulcer of other part of right foot with fat layer 11/10/2021 No Yes exposed E11.621 Type 2 diabetes mellitus with foot ulcer 11/10/2021 No Yes E11.40 Type 2 diabetes mellitus with diabetic neuropathy, unspecified 11/10/2021 No Yes R26.89 Other abnormalities of gait and mobility 11/10/2021 No Yes Inactive Problems ICD-10 Code Description Active Date Inactive Date L97.528 Non-pressure chronic ulcer of other part of left foot with other specified 12/01/2021 12/01/2021 severity Resolved Problems Electronic Signature(s) Signed: 06/08/2022 9:25:36 AM By: Geralyn Corwin DO Entered By: Geralyn Corwin on 06/08/2022 09:04:17 [name] ([id number]) -------------------------------------------------------------------------------- Progress Note Details Patient Name: [name] Date of Service: 06/08/2022 8:30 AM Medical Record Number: [id number] Patient Account Number: [id number] Date of Birth/Sex: 04-22-1956 (65 y.o. M) Treating RN: Angelina Pih Primary Care Provider: Manson Allan Other Clinician: Referring Provider: Manson Allan Treating Provider/Extender: Tilda Franco in Treatment: 30 Subjective Chief Complaint Information obtained from Patient Right plantar foot wound History of Present Illness (HPI) Admission 11/10/2021 Mr. Jaquon Gingerich is a 66 year old male with a past medical history of uncontrolled type 2 diabetes with last hemoglobin A1c of 8.1 complicated by peripheral neuropathy that presents to the clinic for a 2-year history of nonhealing ulcer to the bottom of his right foot. He states this started out as a blister caused by a work boot. He reports receiving wound care when he resided in [place]. He is reestablishing his wound care in [facility] today. He is currently keeping the area clean and covered. He has insoles designed to help offload the wound bed. He currently  denies signs of infection. 1/18;  patient presents for follow-up. He has been using Hydrofera Blue to the wound bed. He has no issues or complaints today. He has not received the defender.[name] He denies signs of infection. 2/1; patient presents for follow-up. He has been using Hydrofera Blue to the wound bed he states he received the defender boot and has been using it however he did not have it on today. He currently denies signs of infection to the right foot. Unfortunately he developed a wound to the left great toe over the past week. He states he received new orthotics and these caused a blister to the left great toe which turned into a wound. He has not been doing anything to the wound bed. He currently denies systemic signs of infection. 2/8; Patient presents for follow-up. 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 He states he is about 90% recovered. He has some mild weakness to the left side. He reports taking the antibiotics prescribed at last clinic visit. He reports using Hydrofera Blue for dressing changes. He only uses the offloading boot when he is at home. He uses open toed shoes to the right foot. He currently denies signs of infection. 2/15; patient presents for follow-up. He states he is still taking the antibiotics prescribed. He reports using the defender boot to the right foot and open toed shoe to the left foot however he is not wearing either today. He currently denies systemic signs of infection. He stated he cut down a tree last week and was outside doing yard work. 2/22; patient presents for follow-up. 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 He reports feeling well. He reports using the defender boot to the right leg and continues to use Hydrofera Blue with dressing changes. He denies systemic signs of infection. 3/8; patient presents for follow-up. He sees Dr.  Alberteen Spindle tomorrow for follow-up of the left great toe amputation. This was a partial amputation. He reports he is taking Augmentin currently. He reports increased erythema to the left great toe amputation site. He also reports a decline in his right plantar foot wound. He reports it is draining more. He denies purulent drainage. 3/15; patient presents for follow-up. He did not see Dr. Alberteen Spindle last week but states he sees him today. He states he has been taking doxycycline in the past week and started gentamicin ointment. He continues to use [facility] with dressing changes. He currently denies systemic signs of infection. 3/22; patient presents for follow-up. Patient had partial amputation of the left great toe on 2/15. 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 Alberteen Spindle. He reports no issues and has no complaints. He is currently taking Keflex and doxycycline prescribed by Dr. Alberteen Spindle. He continues to use gentamicin ointment and Hydrofera Blue to the right foot wound. He reports using the defender boot when he is at home. 3/29; patient presents for follow-up. He continues to be on antibiotics per podiatry. He has been using Hydrofera Blue and gentamicin ointment to the right plantar foot wound. He has no issues or complaints today. He denies signs of infection. 4/5; patient presents for follow-up. He has been using Dakin's wet-to-dry dressings with improvement to wound healing. He denies signs of infection. She has no issues or complaints today. 4/12; diabetic ulcer on the right plantar first  metatarsal head. Use Hydrofera Blue for a prolonged period of time and has been using Dakin's wet- to-dry I think for the last 2 to 3 weeks. He has a Psychologist, forensic at home but he does not wear that coming into the clinic because he drives. He tells me he has been compliant with the defender boot short of when he goes out to drive. He lives alone. He also tells me he has had a previous left  great toe amputation We are following him for the right foot wound. 4/26; patient presents for follow-up. He had worsening of his left foot wound and had surgical debridement on 4/16 by Dr. Fanny Skates. He is being followed by podiatry for this issue. He he is on IV Unasyn based on tissue culture by ID.[name] We are following him for the right foot wound. He has been using Dakin's wet-to-dry dressings without issues. He reports continued usage of the defender boot. 5/3; patient presents for follow-up. He has been using Dakin's wet-to-dry dressings to the right foot wound. He has no issues or complaints today. He reports using the Psychologist, forensic. He reports offloading the foot wound when he is at home by sitting in a recliner. He denies signs of infection. He is still getting IV antibiotics For the left foot wound that is being followed by podiatry. He states the end date is later this month on 5/28. KEES, [name] ([id number]) 5/17; patient presents for follow-up. He missed his last clinic appointment. He has been using Dakin's wet-to-dry dressings. He states he is offloading the right foot wound with the defender boot although he does not have this today. He is still on IV antibiotics. He has no issues or complaints today. 5/24; patient presents for follow-up. He has been using Hydrofera Blue to the wound bed without issues. He denies signs of infection. At last clinic visit he had a wound culture done that detected high levels of Enterococcus faecalis and low levels of Corynebacterium stratum and Staphylococcus epidermidis. [facility]stone antibiotics were ordered. He has not heard from the company yet. 5/31; patient presents for follow-up. He continues to use Hydrofera Blue to the wound bed. He denies signs of infection. Keystone antibiotics were ordered at last clinic visit and he states he called the company to pay for it. He states that it is being shipped. We rediscussed the total contact cast and he  declines having this placed today. He states he is wearing the defender boot however he never wears it into the office. 04-06-2022 upon evaluation today patient appears to be doing about the same in regard to his wound. This is not significantly smaller. He is not wearing his offloading boot. We discussed that today he tells me that he wears it "all the time as he chuckled and does not have it on during the office visit today. I think it is increasingly obvious that he is just messing around when he says this based on what I am seeing and otherwise I do not know why he would wear it all the time but not to the clinic. Either way my opinion based on what I am seeing currently is simply that the wound does seem to be still having quite a bit of callus buildup around the edges it is also very dry. He does tell me that he has been using the [place] since he got it and to be honest I think this is good and should hopefully help keep things under control but at the same  time I do not see any evidence of active infection. Again the Jodie Echevaria is a topical antibiotic with multiple compounds to help prevent further infection and worsening overall. 6/14; patient presents for follow-up. He has been using Keystone antibiotics with [employer]	[place] daily. He states he is wearing his offloading boot. He does not have this in office today. He is going to the beach for the next 5 days. He states he Will not be using using any offloading device During this time. I again offered the total contact cast however he declined. I did ask him to reconsider for next clinic visit. He currently denies signs of infection. 6/21; patient presents for follow-up. He went to the beach last week. He did not use an offloading device. Wound is slightly deeper today. I again offered the total contact cast but he declined. Currently denies signs of infection. He reports using the [place], hydrofera blue and the Psychologist, forensic. 6/28; patient  presents for follow-up. Patient declines total contact cast today. He has been using Keystone and [facility] to the wound bed. It is unclear if he is using the Psychologist, forensic. He would like a letter for the post man to delivers mail to his front door so he does not have to walk down the driveway. 7/5; patient presents for follow-up. He states he is considering the total contact cast however does not want this placed today. He is actually going to the gym after this appointment. He does not want offloading foot wear for this. He has been using Keystone and [facility] to the wound bed. He denies signs of infection. 7/12; patient presents for follow-up. He is still considering the total contact cast however does not want this today. He has been using Keystone antibiotic and Hydrofera Blue to the wound bed. He denies signs of infection. He has no issues or complaints today. 7/26; patient presents for follow-up. He declines a total contact cast. He has been using his an antibiotic and Hydrofera Blue to the wound bed. He denies systemic signs of infection. He is scheduled to discuss orthotics next week with his podiatrist. 8/2; patient presents for follow-up. He has been using Hydrofera Blue to the wound bed. He has no issues or complaints today. He declines a total contact cast. He states he is getting orthotics today. 8/9; patient presents for follow-up. He has been using Hydrofera Blue to the wound bed. He again declines the total contact cast. He does state that he is considering starting it at the end of the month. He has a vacation coming up and does not want to have it in place for that. Objective Constitutional Vitals Time Taken: 8:30 AM, Height: 74 in, Weight: 226 lbs, BMI: 29, Temperature: 97.6 F, Pulse: 87 bpm, Respiratory Rate: 18 breaths/min, Blood Pressure: 121/72 mmHg. General Notes: Right foot: To the plantar aspect of the first metatarsal head there is an open wound with pale  granulation tissue and significant undermining to the 7 11 o'clock position. No increased warmth, erythema or purulent drainage noted. Integumentary (Hair, Skin) Wound #1 status is Open. Original cause of wound was Blister. The date acquired was: 07/15/2020. The wound has been in treatment 30 weeks. The wound is located on the Right,Plantar Foot. The wound measures 1.2cm length x 0.8cm width x 0.7cm depth; 0.754cm^2 area and 0.528cm^3 volume. There is Fat Layer (Subcutaneous Tissue) exposed. There is no tunneling or undermining noted. There is a medium amount of serosanguineous drainage noted. The wound margin is thickened.  There is medium (34-66%) pink granulation within the wound bed. There is a medium (34-66%) amount of necrotic tissue within the wound bed including Adherent Slough. 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 This is not surprising since he does not offload the area. At this time I recommended restarting the [facility] to the wound bed and continuing with [facility]. I will place him on antibiotics since he is at high risk for amputation. He understands he is at high risk for amputation.[name] He is considering the total contact cast now. However he will not do this until the end of the month. Follow-up in 1 week. He knows he is at high risk for amputation. Procedures Wound #1 Pre-procedure diagnosis of Wound #1 is a Diabetic Wound/Ulcer of the Lower Extremity located on the Right,Plantar Foot .Severity of Tissue Pre Debridement is: Fat layer exposed. There was a Excisional Skin/Subcutaneous Tissue Debridement with a total area of 0.96 sq cm performed by Geralyn Corwin, MD. With the following instrument(s): Curette to remove Non-Viable tissue/material. Material removed includes Callus, Subcutaneous Tissue, and Slough. No specimens were taken. A time out was conducted at 08:56, prior to the start of the procedure. A Minimum amount of bleeding was controlled with Pressure. The procedure was tolerated well. Post Debridement Measurements: 1.2cm length x 0.8cm width x 0.7cm depth; 0.528cm^3 volume. Character of Wound/Ulcer Post Debridement is stable. Severity of Tissue Post Debridement is: Fat layer exposed. Post procedure Diagnosis Wound #1: Same as Pre-Procedure Plan Follow-up Appointments: Return Appointment in 1 week. Nurse Visit as needed Bathing/ Shower/ Hygiene: May shower; gently cleanse wound with antibacterial soap, rinse and pat dry prior to dressing wounds No tub bath. Anesthetic (Use 'Patient Medications' Section for Anesthetic Order Entry): Lidocaine applied to wound bed Edema Control - Lymphedema / Segmental Compressive Device / Other: Elevate, Exercise Daily and Avoid Standing for Long Periods of Time. Elevate leg(s) parallel to the floor when sitting. DO YOUR BEST to sleep in the bed at night. DO NOT sleep in your recliner. Long hours of sitting in a recliner leads to swelling of the legs and/or potential wounds on your backside. Off-Loading: Foot Defender  - right foot-keep shin covered-keep pressure off of the wound (Patient is not wear boot to clinic each week. Wearing tennis shoe) Additional Orders / Instructions: Follow Nutritious Diet and Increase Protein Intake - monitor blood sugar to maintain normal limits Other: - Follow up post surgery for left foot-[facility] handling Medications-Please add to medication list.: P.O. Antibiotics - Restart keystone, pick up PO antibiotics please and start WOUND #1: - Foot Wound Laterality: Plantar, Right Cleanser: Dakin 16 (oz) 0.25 Every Other Day/30 Days Discharge Instructions:  Use as directed. Topical: keystone Every Other Day/30 Days Primary Dressing: Gauze Every Other Day/30 Days Discharge Instructions: As directed: moistened with Dakins Solution Secondary Dressing: Gauze Every Other Day/30 Days Discharge Instructions: As directed: dry Secured With: Medipore Tape - 80M Medipore H Soft Cloth Surgical Tape, 2x2 (in/yd) Every Other Day/30 Days Secured With: Conform 4'' - Conforming Stretch Gauze Bandage 4x75 (in/in) Every Other Day/30 Days Discharge Instructions: Apply as directed Kattner, [name] ([id number]) 1. Aggressive offloading defender boot 2. Doxycycline and Augmentin 3. Keystone and [employer]	[place] 4. Follow-up in  1 week Electronic Signature(s) Signed: 06/08/2022 9:25:36 AM By: Geralyn CorwinHoffman, [name] Trela DO Entered By: Geralyn CorwinHoffman, Antonia Culbertson on 06/08/2022 09:08:27 [name] MatinNEELY, [name] ([id number]) -------------------------------------------------------------------------------- SuperBill Details Patient Name: [name] MatinNEELY, [name] Date of Service: 06/08/2022 Medical Record Number: [id number] Patient Account Number: [id number] Date of Birth/Sex: 1956-08-20 (65 y.o. M) Treating RN: Angelina PihGordon, Caitlin Primary Care Provider: Manson AllanFields, Glenda Other Clinician: Referring Provider: Manson AllanFields, Glenda Treating Provider/Extender: Tilda FrancoHoffman, Dewey Neukam Weeks in Treatment: 30 Diagnosis Coding ICD-10 Codes Code Description [id number] 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ed E11.621 Type 2 diabetes mellitus with foot ulcer E11.40 Type 2 diabetes mellitus with diabetic neuropathy, unspecified R26.89 Other abnormalities of gait and mobility Facility Procedures CPT4 Code: [id number] Description: 11042 - DEB SUBQ TISSUE 20 SQ CM/< Modifier: Quantity: 1 CPT4 Code: Description: ICD-10 Diagnosis Description L97.512 Non-pressure chronic ulcer of other part of right foot with fat layer ex Modifier: posed Quantity: Physician Procedures CPT4 Code: [id number]: [id number] Description: 99214 - WC  PHYS LEVEL 4 - EST PT Modifier: Quantity: 1 CPT4 Code: Description: ICD-10 Diagnosis Description E11.621 Type 2 diabetes mellitus with foot ulcer E11.40 Type 2 diabetes mellitus with diabetic neuropathy, unspecified L97.512 Non-pressure chronic ulcer of other part of right foot with fat layer ex Modifier: posed Quantity: CPT4 Code: [id number] Description: 11042 - WC PHYS SUBQ TISS 20 SQ CM Modifier: Quantity: 1 CPT4 Code: Description: ICD-10 Diagnosis Description L97.512 Non-pressure chronic ulcer of other part of right foot with fat layer ex Modifier: posed Quantity: Electronic Signature(s) Signed: 06/08/2022 9:25:36 AM By: Geralyn CorwinHoffman, Pamella Samons DO Entered By: Geralyn CorwinHoffman, Giselle Brutus on 06/08/2022 09:08:47

## 2022-06-08 NOTE — Progress Notes (Signed)
[name] ([id number]) Visit Report for 06/08/2022 Arrival Information Details Patient Name: [name], [name] Date of Service: 06/08/2022 8:30 AM Medical Record Number: [id number] Patient Account Number: [id number] Date of Birth/Sex: 1955/11/20 (65 y.o. M) Treating RN: Levora Dredge Primary Care Rosalio Catterton: [name] Other Clinician: Referring Reason Helzer: [name] Treating Alicja Everitt/Extender: Yaakov Guthrie in Treatment: 61 Visit Information History Since Last Visit Added or deleted any medications: Yes Patient Arrived: Ambulatory Any new allergies or adverse reactions: No Arrival Time: 08:28 Had a fall or experienced change in No Accompanied By: self activities of daily living that may affect Transfer Assistance: None risk of falls: Patient Identification Verified: Yes Hospitalized since last visit: No Secondary Verification Process Completed: Yes Has Dressing in Place as Prescribed: Yes Patient Requires Transmission-Based No Pain Present Now: No Precautions: Patient Has Alerts: Yes Patient Alerts: Patient on Blood Thinner DIABETIC Plavix Notes pt had recent eye surgery to right eye, states he is on eye drops 2 times daily does not remember names Electronic Signature(s) Signed: 06/08/2022 3:54:56 PM By: Levora Dredge Entered By: Levora Dredge on 06/08/2022 08:31:01 [name] ([id number]) -------------------------------------------------------------------------------- Clinic Level of Care Assessment Details Patient Name: [name] Date of Service: 06/08/2022 8:30 AM Medical Record Number: [id number] Patient Account Number: [id number] Date of Birth/Sex: [birth year] (65 y.o. M) Treating RN: Levora Dredge Primary Care Nicole Hafley: [name] Other Clinician: Referring Shyam Dawson: [name] Treating Tyden Kann/Extender: Yaakov Guthrie in Treatment: 30 Clinic Level of Care Assessment Items TOOL 1 Quantity Score []  - Use when EandM and  Procedure is performed on INITIAL visit 0 ASSESSMENTS - Nursing Assessment / Reassessment []  - General Physical Exam (combine w/ comprehensive assessment (listed just below) when performed on new 0 pt. evals) []  - 0 Comprehensive Assessment (HX, ROS, Risk Assessments, Wounds Hx, etc.) ASSESSMENTS - Wound and Skin Assessment / Reassessment []  - Dermatologic / Skin Assessment (not related to wound area) 0 ASSESSMENTS - Ostomy and/or Continence Assessment and Care []  - Incontinence Assessment and Management 0 []  - 0 Ostomy Care Assessment and Management (repouching, etc.) PROCESS - Coordination of Care []  - Simple Patient / Family Education for ongoing care 0 []  - 0 Complex (extensive) Patient / Family Education for ongoing care []  - 0 Staff obtains Programmer, systems, Records, Test Results / Process Orders []  - 0 Staff telephones HHA, Nursing Homes / Clarify orders / etc []  - 0 Routine Transfer to another Facility (non-emergent condition) []  - 0 Routine Hospital Admission (non-emergent condition) []  - 0 New Admissions / Biomedical engineer / Ordering NPWT, Apligraf, etc. []  - 0 Emergency Hospital Admission (emergent condition) PROCESS - Special Needs []  - Pediatric / Minor Patient Management 0 []  - 0 Isolation Patient Management []  - 0 Hearing / Language / Visual special needs []  - 0 Assessment of Community assistance (transportation, D/C planning, etc.) []  - 0 Additional assistance / Altered mentation []  - 0 Support Surface(s) Assessment (bed, cushion, seat, etc.) INTERVENTIONS - Miscellaneous []  - External ear exam 0 []  - 0 Patient Transfer (multiple staff / Civil Service fast streamer / Similar devices) []  - 0 Simple Staple / Suture removal (25 or less) []  - 0 Complex Staple / Suture removal (26 or more) []  - 0 Hypo/Hyperglycemic Management (do not check if billed separately) []  - 0 Ankle / Brachial Index (ABI) - do not check if billed separately Has the patient been seen at the  hospital within the last three years: Yes Total Score: 0 Level Of Care: ____ [name] ([id number]) Electronic Signature(s) Signed: 06/08/2022 3:54:56  PM By: Levora Dredge Entered By: Levora Dredge on 06/08/2022 09:10:36 [name] ([id number]) -------------------------------------------------------------------------------- Encounter Discharge Information Details Patient Name: [name] Date of Service: 06/08/2022 8:30 AM Medical Record Number: [id number] Patient Account Number: [id number] Date of Birth/Sex: 1956/01/20 (65 y.o. M) Treating RN: Levora Dredge Primary Care Tannah Dreyfuss: [name] Other Clinician: Referring Kemya Shed: [name] Treating Makila Colombe/Extender: Yaakov Guthrie in Treatment: 30 Encounter Discharge Information Items Post Procedure Vitals Discharge Condition: Stable Temperature (F): 97.6 Ambulatory Status: Ambulatory Pulse (bpm): 87 Discharge Destination: Home Respiratory Rate (breaths/min): 18 Transportation: Private Auto Blood Pressure (mmHg): 121/72 Accompanied By: self Schedule Follow-up Appointment: Yes Clinical Summary of Care: Electronic Signature(s) Signed: 06/08/2022 9:16:55 AM By: Levora Dredge Entered By: Levora Dredge on 06/08/2022 09:16:55 [name] ([id number]) -------------------------------------------------------------------------------- Lower Extremity Assessment Details Patient Name: [name] Date of Service: 06/08/2022 8:30 AM Medical Record Number: [id number] Patient Account Number: [id number] Date of Birth/Sex: 1955-11-17 (65 y.o. M) Treating RN: Levora Dredge Primary Care Kalla Watson: [name] Other Clinician: Referring Talana Slatten: [name] Treating Kagen Kunath/Extender: Yaakov Guthrie in Treatment: 30 Vascular Assessment Pulses: Dorsalis Pedis Palpable: [Right:Yes] Electronic Signature(s) Signed: 06/08/2022 3:54:56 PM By: Levora Dredge Entered By: Levora Dredge on  06/08/2022 08:37:46 [name] ([id number]) -------------------------------------------------------------------------------- Multi Wound Chart Details Patient Name: [name] Date of Service: 06/08/2022 8:30 AM Medical Record Number: [id number] Patient Account Number: [id number] Date of Birth/Sex: 1956-04-19 (65 y.o. M) Treating RN: Levora Dredge Primary Care [name] Claar: [name] Other Clinician: Referring Caeden Foots: [name] Treating Sharron Simpson/Extender: Yaakov Guthrie in Treatment: 30 Vital Signs Height(in): 74 Pulse(bpm): 87 Weight(lbs): 226 Blood Pressure(mmHg): 121/72 Body Mass Index(BMI): 29 Temperature(F): 97.6 Respiratory Rate(breaths/min): 18 Photos: [N/A:N/A] Wound Location: Right, Plantar Foot N/A N/A Wounding Event: Blister N/A N/A Primary Etiology: Diabetic Wound/Ulcer of the Lower N/A N/A Extremity Comorbid History: Type II Diabetes, Osteoarthritis N/A N/A Date Acquired: 07/15/2020 N/A N/A Weeks of Treatment: 30 N/A N/A Wound Status: Open N/A N/A Wound Recurrence: No N/A N/A Measurements L x W x D (cm) 1.2x0.8x0.7 N/A N/A Area (cm) : 0.754 N/A N/A Volume (cm) : 0.528 N/A N/A % Reduction in Area: 32.90% N/A N/A % Reduction in Volume: 21.70% N/A N/A Classification: Grade 2 N/A N/A Exudate Amount: Medium N/A N/A Exudate Type: Serosanguineous N/A N/A Exudate Color: red, brown N/A N/A Wound Margin: Thickened N/A N/A Granulation Amount: Medium (34-66%) N/A N/A Granulation Quality: Pink N/A N/A Necrotic Amount: Medium (34-66%) N/A N/A Exposed Structures: Fat Layer (Subcutaneous Tissue): N/A N/A Yes Fascia: No Tendon: No Muscle: No Joint: No Bone: No Epithelialization: None N/A N/A Treatment Notes Electronic Signature(s) Signed: 06/08/2022 3:54:56 PM By: Levora Dredge Entered By: Levora Dredge on 06/08/2022 08:38:01 [name]  ([id number]) -------------------------------------------------------------------------------- [place] Details Patient Name: [name] Date of Service: 06/08/2022 8:30 AM Medical Record Number: [id number] Patient Account Number: [id number] Date of Birth/Sex: 04/11/1956 (65 y.o. M) Treating RN: Levora Dredge Primary Care Valerio Pinard: [name] Other Clinician: Referring Sharronda Schweers: [name] Treating Denny Mccree/Extender: Yaakov Guthrie in Treatment: 30 Active Inactive Wound/Skin Impairment Nursing Diagnoses: Impaired tissue integrity Knowledge deficit related to smoking impact on wound healing Knowledge deficit related to ulceration/compromised skin integrity Goals: Patient/caregiver will verbalize understanding of skin care regimen Date Initiated: 11/10/2021 Date Inactivated: 12/01/2021 Target Resolution Date: 11/17/2021 Goal Status: Met Ulcer/skin breakdown will have a volume reduction of 30% by week 4 Date Initiated: 11/10/2021 Date Inactivated: 02/02/2022 Target Resolution Date: 12/08/2021 Goal Status: Met Ulcer/skin breakdown will have a volume reduction of 50% by week 8 Date Initiated: 11/10/2021  Target Resolution Date: 01/05/2022 Goal Status: Active Ulcer/skin breakdown will have a volume reduction of 80% by week 12 Date Initiated: 11/10/2021 Target Resolution Date: 02/02/2022 Goal Status: Active Ulcer/skin breakdown will heal within 14 weeks Date Initiated: 11/10/2021 Target Resolution Date: 02/16/2022 Goal Status: Active Interventions: Assess patient/caregiver ability to obtain necessary supplies Assess patient/caregiver ability to perform ulcer/skin care regimen upon admission and as needed Assess ulceration(s) every visit Notes: Electronic Signature(s) Signed: 06/08/2022 3:54:56 PM By: Levora Dredge Entered By: Levora Dredge on 06/08/2022 08:37:52 [name]  ([id number]) -------------------------------------------------------------------------------- Pain Assessment Details Patient Name: [name] Date of Service: 06/08/2022 8:30 AM Medical Record Number: [id number] Patient Account Number: [id number] Date of Birth/Sex: February 22, 1956 (65 y.o. M) Treating RN: Levora Dredge Primary Care Kevionna Heffler: [name] Other Clinician: Referring Macauley Mossberg: [name] Treating Brittannie Tawney/Extender: Yaakov Guthrie in Treatment: 30 Active Problems Location of Pain Severity and Description of Pain Patient Has Paino No Site Locations Rate the pain. Current Pain Level: 0 Pain Management and Medication Current Pain Management: Electronic Signature(s) Signed: 06/08/2022 3:54:56 PM By: Levora Dredge Entered By: Levora Dredge on 06/08/2022 08:31:44 [name] ([id number]) -------------------------------------------------------------------------------- Patient/Caregiver Education Details Patient Name: [name] Date of Service: 06/08/2022 8:30 AM Medical Record Number: [id number] Patient Account Number: [id number] Date of Birth/Gender: 03-Aug-1956 (65 y.o. M) Treating RN: Levora Dredge Primary Care Physician: [name] Other Clinician: Referring Physician: [name] Treating Physician/Extender: Yaakov Guthrie in Treatment: 30 Education Assessment Education Provided To: Patient Education Topics Provided Offloading: Handouts: What is Offloadingo Methods: Explain/Verbal Responses: State content correctly Wound Debridement: Handouts: Wound Debridement Methods: Explain/Verbal Responses: State content correctly Wound/Skin Impairment: Handouts: Caring for Your Ulcer Methods: Explain/Verbal Responses: State content correctly Electronic Signature(s) Signed: 06/08/2022 3:54:56 PM By: Levora Dredge Entered By: Levora Dredge on 06/08/2022 09:15:38 [name]  ([id number]) -------------------------------------------------------------------------------- Wound Assessment Details Patient Name: [name] Date of Service: 06/08/2022 8:30 AM Medical Record Number: [id number] Patient Account Number: [id number] Date of Birth/Sex: 12-31-1955 (65 y.o. M) Treating RN: Levora Dredge Primary Care Kaesen Rodriguez: [name] Other Clinician: Referring Florenda Watt: [name] Treating Navid Lenzen/Extender: Yaakov Guthrie in Treatment: 30 Wound Status Wound Number: 1 Primary Etiology: Diabetic Wound/Ulcer of the Lower Extremity Wound Location: Right, Plantar Foot Wound Status: Open Wounding Event: Blister Comorbid History: Type II Diabetes, Osteoarthritis Date Acquired: 07/15/2020 Weeks Of Treatment: 30 Clustered Wound: No Photos Wound Measurements Length: (cm) 1.2 % Width: (cm) 0.8 % Depth: (cm) 0.7 Ep Area: (cm) 0.754 T Volume: (cm) 0.528 U Reduction in Area: 32.9% Reduction in Volume: 21.7% ithelialization: None unneling: No ndermining: No Wound Description Classification: Grade 2 Fo Wound Margin: Thickened Sl Exudate Amount: Medium Exudate Type: Serosanguineous Exudate Color: red, brown ul Odor After Cleansing: No ough/Fibrino Yes Wound Bed Granulation Amount: Medium (34-66%) Exposed Structure Granulation Quality: Pink Fascia Exposed: No Necrotic Amount: Medium (34-66%) Fat Layer (Subcutaneous Tissue) Exposed: Yes Necrotic Quality: Adherent Slough Tendon Exposed: No Muscle Exposed: No Joint Exposed: No Bone Exposed: No Treatment Notes Wound #1 (Foot) Wound Laterality: Plantar, Right Cleanser Dakin 16 (oz) 0.25 Discharge Instruction: Use as directed. Peri-Wound Care LOIS, OSTROM ([id number]) Topical keystone Primary Dressing Gauze Discharge Instruction: As directed: moistened with Dakins Solution Secondary Dressing Gauze Discharge Instruction: As directed: dry Secured With Medipore Tape - 4M Medipore H Soft  Cloth Surgical Tape, 2x2 (in/yd) Conform 4'' - Conforming Stretch Gauze Bandage 4x75 (in/in) Discharge Instruction: Apply as directed Compression Wrap Compression Stockings Add-Ons Electronic Signature(s) Signed: 06/08/2022 3:54:56 PM By: Levora Dredge Entered By: Levora Dredge on 06/08/2022 08:37:29  SHAYLON, ADEN ([id number]) -------------------------------------------------------------------------------- Vitals Details Patient Name: ELIGA, ARVIE Date of Service: 06/08/2022 8:30 AM Medical Record Number: [id number] Patient Account Number: [id number] Date of Birth/Sex: [birth year] (65 y.o. M) Treating RN: Levora Dredge Primary Care Janeah Kovacich: [name] Other Clinician: Referring Glenden Rossell: [name] Treating Jameshia Hayashida/Extender: Yaakov Guthrie in Treatment: 30 Vital Signs Time Taken: 08:30 Temperature (F): 97.6 Height (in): 74 Pulse (bpm): 87 Weight (lbs): 226 Respiratory Rate (breaths/min): 18 Body Mass Index (BMI): 29 Blood Pressure (mmHg): 121/72 Reference Range: 80 - 120 mg / dl Electronic Signature(s) Signed: 06/08/2022 3:54:56 PM By: Levora Dredge Entered By: Levora Dredge on 06/08/2022 08:31:37

## 2022-06-15 ENCOUNTER — Encounter (HOSPITAL_BASED_OUTPATIENT_CLINIC_OR_DEPARTMENT_OTHER): Payer: Medicare Other | Admitting: Internal Medicine

## 2022-06-15 DIAGNOSIS — L97512 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ed: Secondary | ICD-10-CM | POA: Diagnosis not present

## 2022-06-15 DIAGNOSIS — E11621 Type 2 diabetes mellitus with foot ulcer: Secondary | ICD-10-CM | POA: Diagnosis not present

## 2022-06-17 NOTE — Progress Notes (Signed)
[name], [name] ([id number]) Visit Report for 06/15/2022 Chief Complaint Document Details Patient Name: [name], [name] Date of Service: 06/15/2022 8:30 AM Medical Record Number: [id number] Patient Account Number: [id number] Date of Birth/Sex: [birth year] (65 y.o. M) Treating RN: Huel Coventry Primary Care Provider: Manson Allan Other Clinician: Referring Provider: Manson Allan Treating Provider/Extender: Tilda Franco in Treatment: 31 Information Obtained from: Patient Chief Complaint Right plantar foot wound Electronic Signature(s) Signed: 06/15/2022 9:15:36 AM By: Geralyn Corwin DO Entered By: Geralyn Corwin on 06/15/2022 09:04:31 [name] ([id number]) -------------------------------------------------------------------------------- Debridement Details Patient Name: [name] Date of Service: 06/15/2022 8:30 AM Medical Record Number: [id number] Patient Account Number: [id number] Date of Birth/Sex: 1956/05/29 (65 y.o. M) Treating RN: Huel Coventry Primary Care Provider: Manson Allan Other Clinician: Referring Provider: Manson Allan Treating Provider/Extender: Tilda Franco in Treatment: 31 Debridement Performed for Wound #1 Right,Plantar Foot Assessment: Performed By: Physician Geralyn Corwin, MD Debridement Type: Debridement Severity of Tissue Pre Debridement: Fat layer exposed Level of Consciousness (Pre- Awake and Alert procedure): Pre-procedure Verification/Time Out Yes - 08:53 Taken: Total Area Debrided (L x W): 0.9 (cm) x 0.6 (cm) = 0.54 (cm) Tissue and other material Viable, Non-Viable, Callus, Slough, Subcutaneous, Slough debrided: Level: Skin/Subcutaneous Tissue Debridement Description: Excisional Instrument: Curette Bleeding: Minimum Hemostasis Achieved: Pressure Response to Treatment: Procedure was tolerated well Level of Consciousness (Post- Awake and Alert procedure): Post Debridement Measurements of Total Wound Length:  (cm) 0.9 Width: (cm) 0.8 Depth: (cm) 0.5 Volume: (cm) 0.283 Character of Wound/Ulcer Post Debridement: Stable Severity of Tissue Post Debridement: Fat layer exposed Post Procedure Diagnosis Same as Pre-procedure Electronic Signature(s) Signed: 06/15/2022 9:15:36 AM By: Geralyn Corwin DO Signed: 06/16/2022 7:54:04 AM By: Elliot Gurney, BSN, RN, CWS, Kim RN, BSN Entered By: Elliot Gurney, BSN, RN, CWS, Kim on 06/15/2022 08:54:25 [name] ([id number]) -------------------------------------------------------------------------------- HPI Details Patient Name: [name] Date of Service: 06/15/2022 8:30 AM Medical Record Number: [id number] Patient Account Number: [id number] Date of Birth/Sex: [birth year] (65 y.o. M) Treating RN: Huel Coventry Primary Care Provider: Manson Allan Other Clinician: Referring Provider: Manson Allan Treating Provider/Extender: Tilda Franco in Treatment: 31 History of Present Illness HPI Description: Admission 11/10/2021 [name] is a 66 year old male with a past medical history of uncontrolled type 2 diabetes with last hemoglobin A1c of 8.1 complicated by peripheral neuropathy that presents to the clinic for a 2-year history of nonhealing ulcer to the bottom of his right foot. He states this started out as a blister caused by a work boot. He reports receiving wound care when he resided in [place]. He is reestablishing his wound care in [facility] today. He is currently keeping the area clean and covered. He has insoles designed to help offload the wound bed. He currently denies signs of infection. 1/18; patient presents for follow-up. He has been using Hydrofera Blue to the wound bed. He has no issues or complaints today. He has not received the defender.[name] He denies signs of infection. 2/1; patient presents for follow-up. He has been using Hydrofera Blue to the wound bed he states he received the defender boot and has been using it  however he did not have it on today. He currently denies signs of infection to the right foot. Unfortunately he developed a wound to the left great toe over the past week. He states he received new orthotics and these caused a blister to the left great toe which turned into a wound. He has not been doing anything to the wound bed. He currently denies systemic signs  of infection. 2/8; Patient presents for follow-up. 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 He states he is about 90% recovered. He has some mild weakness to the left side. He reports taking the antibiotics prescribed at last clinic visit. He reports using Hydrofera Blue for dressing changes. He only uses the offloading boot when he is at home. He uses open toed shoes to the right foot. He currently denies signs of infection. 2/15; patient presents for follow-up. He states he is still taking the antibiotics prescribed. He reports using the defender boot to the right foot and open toed shoe to the left foot however he is not wearing either today. He currently denies systemic signs of infection. He stated he cut down a tree last week and was outside doing yard work. 2/22; patient presents for follow-up. 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 He reports feeling well. He reports using the defender boot to the right leg and continues to use Hydrofera Blue with dressing changes. He denies systemic signs of infection. 3/8; patient presents for follow-up. He sees Dr. Alberteen Spindle tomorrow for follow-up of the left great toe amputation. This was a partial amputation. He reports he is taking Augmentin currently. He reports increased erythema to the left great toe amputation site. He also reports a decline in his right plantar foot wound. He reports it is draining more. He denies purulent drainage. 3/15;  patient presents for follow-up. He did not see Dr. Alberteen Spindle last week but states he sees him today. He states he has been taking doxycycline in the past week and started gentamicin ointment. He continues to use [facility] with dressing changes. He currently denies systemic signs of infection. 3/22; patient presents for follow-up. Patient had partial amputation of the left great toe on 2/15. 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 Alberteen Spindle. He reports no issues and has no complaints. He is currently taking Keflex and doxycycline prescribed by Dr. Alberteen Spindle. He continues to use gentamicin ointment and Hydrofera Blue to the right foot wound. He reports using the defender boot when he is at home. 3/29; patient presents for follow-up. He continues to be on antibiotics per podiatry. He has been using Hydrofera Blue and gentamicin ointment to the right plantar foot wound. He has no issues or complaints today. He denies signs of infection. 4/5; patient presents for follow-up. He has been using Dakin's wet-to-dry dressings with improvement to wound healing. He denies signs of infection. She has no issues or complaints today. 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 Use Hydrofera Blue for a prolonged period of time and has been using Dakin's wet- to-dry I think for the last 2 to 3 weeks. He has a Psychologist, forensic at home but he does not wear that coming into the clinic because he drives. He tells me he has been compliant with the defender boot short of when he goes out to drive. He lives alone. He also tells me he has had a previous left great toe amputation We are following him for the right foot wound. 4/26; patient presents for follow-up. He had worsening of his left foot wound and had surgical debridement on 4/16 by Dr. Fanny Skates. He is being followed by podiatry for this issue. He he is on IV Unasyn based on tissue culture by  ID.. We are following him for the  right foot wound. He has been using Dakin's wet-to-dry dressings without issues. He reports continued usage of the defender boot. 5/3; patient presents for follow-up. He has been using Dakin's wet-to-dry dressings to the right foot wound. He has no issues or complaints today. He reports using the Psychologist, forensic. He reports offloading the foot wound when he is at home by sitting in a recliner. He denies signs of infection. He is still getting IV antibiotics For the left foot wound that is being followed by podiatry. He states the end date is later this month on 5/28. 5/17; patient presents for follow-up. He missed his last clinic appointment. He has been using Dakin's wet-to-dry dressings. He states he is offloading the right foot wound with the defender boot although he does not have this today. He is still on IV antibiotics. He has no issues or complaints today. 5/24; patient presents for follow-up. He has been using Hydrofera Blue to the wound bed without issues. He denies signs of infection. At last clinic [facility], [place] ([id number]) visit he had a wound culture done that detected high levels of Enterococcus faecalis and low levels of Corynebacterium stratum and Staphylococcus epidermidis. Keystone antibiotics were ordered. He has not heard from the company yet. 5/31; patient presents for follow-up. He continues to use Hydrofera Blue to the wound bed. He denies signs of infection. Keystone antibiotics were ordered at last clinic visit and he states he called the company to pay for it. He states that it is being shipped. We rediscussed the total contact cast and he declines having this placed today. He states he is wearing the defender boot however he never wears it into the office. 04-06-2022 upon evaluation today patient appears to be doing about the same in regard to his wound. This is not significantly smaller. He is not wearing his offloading boot. We discussed that today he tells me that he wears  it "all the time as he chuckled and does not have it on during the office visit today. I think it is increasingly obvious that he is just messing around when he says this based on what I am seeing and otherwise I do not know why he would wear it all the time but not to the clinic. Either way my opinion based on what I am seeing currently is simply that the wound does seem to be still having quite a bit of callus buildup around the edges it is also very dry. He does tell me that he has been using the [facility] since he got it and to be honest I think this is good and should hopefully help keep things under control but at the same time I do not see any evidence of active infection. Again the Jodie Echevaria is a topical antibiotic with multiple compounds to help prevent further infection and worsening overall. 6/14; patient presents for follow-up. He has been using Keystone antibiotics with [employer]	[place] daily. He states he is wearing his offloading boot. He does not have this in office today. He is going to the beach for the next 5 days. He states he Will not be using using any offloading device During this time. I again offered the total contact cast however he declined. I did ask him to reconsider for next clinic visit. He currently denies signs of infection. 6/21; patient presents for follow-up. He went to the beach last week. He did not use an offloading device. Wound is slightly deeper  today. I again offered the total contact cast but he declined. Currently denies signs of infection. He reports using the [place], hydrofera blue and the Psychologist, forensic. 6/28; patient presents for follow-up. Patient declines total contact cast today. He has been using Keystone and [facility] to the wound bed. It is unclear if he is using the Psychologist, forensic. He would like a letter for the post man to delivers mail to his front door so he does not have to walk down the driveway. 7/5; patient presents for follow-up. He  states he is considering the total contact cast however does not want this placed today. He is actually going to the gym after this appointment. He does not want offloading foot wear for this. He has been using Keystone and [facility] to the wound bed. He denies signs of infection. 7/12; patient presents for follow-up. He is still considering the total contact cast however does not want this today. He has been using Keystone antibiotic and Hydrofera Blue to the wound bed. He denies signs of infection. He has no issues or complaints today. 7/26; patient presents for follow-up. He declines a total contact cast. He has been using his an antibiotic and Hydrofera Blue to the wound bed. He denies systemic signs of infection. He is scheduled to discuss orthotics next week with his podiatrist. 8/2; patient presents for follow-up. He has been using Hydrofera Blue to the wound bed. He has no issues or complaints today. He declines a total contact cast. He states he is getting orthotics today. 8/9; patient presents for follow-up. He has been using Hydrofera Blue to the wound bed. He again declines the total contact cast. He does state that he is considering starting it at the end of the month. He has a vacation coming up and does not want to have it in place for that. 8/16; patient presents for follow-up. He has been using Hydrofera Blue and Keystone to the wound bed. He started taking Augmentin and doxycycline as prescribed. He denies signs of infection. He is going on his vacation next week and will be back in 2 weeks. He states he is considering the total contact cast for when he comes back from his trip. Electronic Signature(s) Signed: 06/15/2022 9:15:36 AM By: Geralyn Corwin DO Entered By: Geralyn Corwin on 06/15/2022 09:05:35 [name] ([id number]) -------------------------------------------------------------------------------- Physical Exam Details Patient Name: [name] Date of  Service: 06/15/2022 8:30 AM Medical Record Number: [id number] Patient Account Number: [id number] Date of Birth/Sex: May 05, 1956 (65 y.o. M) Treating RN: Huel Coventry Primary Care Provider: Manson Allan Other Clinician: Referring Provider: Manson Allan Treating Provider/Extender: Tilda Franco in Treatment: 31 Constitutional . Cardiovascular . Psychiatric . Notes Right foot: To the plantar aspect of the first metatarsal head there is an open wound with granulation tissue and significant undermining to the 7o 11 o'clock position. No increased warmth, erythema or purulent drainage noted. Electronic Signature(s) Signed: 06/15/2022 9:15:36 AM By: Geralyn Corwin DO Entered By: Geralyn Corwin on 06/15/2022 09:06:03 [name] ([id number]) -------------------------------------------------------------------------------- Physician Orders Details Patient Name: [name] Date of Service: 06/15/2022 8:30 AM Medical Record Number: [id number] Patient Account Number: [id number] Date of Birth/Sex: 1956-05-10 (65 y.o. M) Treating RN: Huel Coventry Primary Care Provider: Manson Allan Other Clinician: Referring Provider: Manson Allan Treating Provider/Extender: Tilda Franco in Treatment: 67 Verbal / Phone Orders: No Diagnosis Coding Follow-up Appointments o Return Appointment in 1 week. o Nurse Visit as needed Bathing/ Shower/ Hygiene o May shower; gently cleanse wound with antibacterial  soap, rinse and pat dry prior to dressing wounds o No tub bath. Anesthetic (Use 'Patient Medications' Section for Anesthetic Order Entry) o Lidocaine applied to wound bed Edema Control - Lymphedema / Segmental Compressive Device / Other o Elevate, Exercise Daily and Avoid Standing for Long Periods of Time. o Elevate leg(s) parallel to the floor when sitting. o DO YOUR BEST to sleep in the bed at night. DO NOT sleep in your recliner. Long hours of sitting in a  recliner leads to swelling of the legs and/or potential wounds on your backside. Off-Loading o Foot Defender o - right foot-keep shin covered-keep pressure off of the wound (Patient is not wear boot to clinic each week. Wearing tennis shoe) Additional Orders / Instructions o Follow Nutritious Diet and Increase Protein Intake - monitor blood sugar to maintain normal limits o Other: - Follow up post surgery for left foot-[facility] handling Medications-Please add to medication list. o P.O. Antibiotics - Continue keystone, pick up PO antibiotics please and start Wound Treatment Wound #1 - Foot Wound Laterality: Plantar, Right Cleanser: Wound Cleanser Every Other Day/30 Days Discharge Instructions: Wash your hands with soap and water. Remove old dressing, discard into plastic bag and place into trash. Cleanse the wound with Wound Cleanser prior to applying a clean dressing using gauze sponges, not tissues or cotton balls. Do not scrub or use excessive force. Pat dry using gauze sponges, not tissue or cotton balls. Topical: keystone Every Other Day/30 Days Discharge Instructions: Patient did not bring to clinic. Will do at home later tonight. Primary Dressing: Hydrofera Blue Ready Transfer Foam, 2.5x2.5 (in/in) Every Other Day/30 Days Discharge Instructions: Apply Hydrofera Blue Ready to wound bed as directed Secondary Dressing: Zetuvit Plus 4x4 (in/in) (DME) (Generic) Every Other Day/30 Days Secured With: Coban Cohesive Bandage 4x5 (yds) Stretched Every Other Day/30 Days Discharge Instructions: Apply Coban as directed. Patient Medications Allergies: No Known Allergies Notifications Medication Indication Start End amoxicillin-pot clavulanate 06/15/2022 DOSE 1 - oral 875 mg-125 mg tablet - 1 tablet oral BID x 7 days doxycycline hyclate 06/15/2022 DOSE 1 - oral 100 mg tablet - 1 tablet oral BID x 7 days [place], Malvin ([id number]) Electronic Signature(s) Signed: 06/15/2022 9:15:11  AM By: Geralyn CorwinHoffman, Maxie Debose DO Entered By: Geralyn CorwinHoffman, Lemon Whitacre on 06/15/2022 09:15:10 [name], Devlon ([id number]) -------------------------------------------------------------------------------- Problem List Details Patient Name: [name], Valerio Date of Service: 06/15/2022 8:30 AM Medical Record Number: [id number] Patient Account Number: [id number] Date of Birth/Sex: 03/09/1956 (65 y.o. M) Treating RN: Huel CoventryWoody, Kim Primary Care Provider: Manson AllanFields, Glenda Other Clinician: Referring Provider: Manson AllanFields, Glenda Treating Provider/Extender: Tilda FrancoHoffman, Adien Kimmel Weeks in Treatment: 7431 Active Problems ICD-10 Encounter Code Description Active Date MDM Diagnosis L97.512 Non-pressure chronic ulcer of other part of right foot with fat layer 11/10/2021 No Yes exposed E11.621 Type 2 diabetes mellitus with foot ulcer 11/10/2021 No Yes E11.40 Type 2 diabetes mellitus with diabetic neuropathy, unspecified 11/10/2021 No Yes R26.89 Other abnormalities of gait and mobility 11/10/2021 No Yes Inactive Problems ICD-10 Code Description Active Date Inactive Date L97.528 Non-pressure chronic ulcer of other part of left foot with other specified 12/01/2021 12/01/2021 severity Resolved Problems Electronic Signature(s) Signed: 06/15/2022 9:15:36 AM By: Geralyn CorwinHoffman, Quinton Voth DO Entered By: Geralyn CorwinHoffman, Gyanna Jarema on 06/15/2022 09:04:27 [name], Neville ([id number]) -------------------------------------------------------------------------------- Progress Note Details Patient Name: [name], Denys Date of Service: 06/15/2022 8:30 AM Medical Record Number: [id number] Patient Account Number: [id number] Date of Birth/Sex: 03/09/1956 (65 y.o. M) Treating RN: Huel CoventryWoody, Kim Primary Care Provider: Manson AllanFields, Glenda Other Clinician: Referring Provider: Manson AllanFields, Glenda Treating Provider/Extender: Mikey BussingHoffman,  Alexander Bergeron in Treatment: 31 Subjective Chief Complaint Information obtained from Patient Right plantar foot wound History of Present Illness  (HPI) Admission 11/10/2021 [name] is a 66 year old male with a past medical history of uncontrolled type 2 diabetes with last hemoglobin A1c of 8.1 complicated by peripheral neuropathy that presents to the clinic for a 2-year history of nonhealing ulcer to the bottom of his right foot. He states this started out as a blister caused by a work boot. He reports receiving wound care when he resided in [place]. He is reestablishing his wound care in [facility] today. He is currently keeping the area clean and covered. He has insoles designed to help offload the wound bed. He currently denies signs of infection. 1/18; patient presents for follow-up. He has been using Hydrofera Blue to the wound bed. He has no issues or complaints today. He has not received the defender.[name] He denies signs of infection. 2/1; patient presents for follow-up. He has been using Hydrofera Blue to the wound bed he states he received the defender boot and has been using it however he did not have it on today. He currently denies signs of infection to the right foot. Unfortunately he developed a wound to the left great toe over the past week. He states he received new orthotics and these caused a blister to the left great toe which turned into a wound. He has not been doing anything to the wound bed. He currently denies systemic signs of infection. 2/8; Patient presents for follow-up. 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 He states he is about 90% recovered. He has some mild weakness to the left side. He reports taking the antibiotics prescribed at last clinic visit. He reports using Hydrofera Blue for dressing changes. He only uses the offloading boot when he is at home. He uses open toed shoes to the right foot. He currently denies signs of infection. 2/15; patient presents for  follow-up. He states he is still taking the antibiotics prescribed. He reports using the defender boot to the right foot and open toed shoe to the left foot however he is not wearing either today. He currently denies systemic signs of infection. He stated he cut down a tree last week and was outside doing yard work. 2/22; patient presents for follow-up. 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 He reports feeling well. He reports using the defender boot to the right leg and continues to use Hydrofera Blue with dressing changes. He denies systemic signs of infection. 3/8; patient presents for follow-up. He sees Dr. Alberteen Spindle tomorrow for follow-up of the left great toe amputation. This was a partial amputation. He reports he is taking Augmentin currently. He reports increased erythema to the left great toe amputation site. He also reports a decline in his right plantar foot wound. He reports it is draining more. He denies purulent drainage. 3/15; patient presents for follow-up. He did not see Dr. Alberteen Spindle last week but states he sees him today. He states he has been taking doxycycline in the past week and started gentamicin ointment. He continues to use [facility] with dressing changes. He currently denies systemic signs of infection. 3/22; patient presents for follow-up. Patient had partial amputation of the left great toe on 2/15. And had complete amputation of the left great toe on 01/14/2022  by Dr. Alberteen Spindle. He reports no issues and has no complaints. He is currently taking Keflex and doxycycline prescribed by Dr. Alberteen Spindle. He continues to use gentamicin ointment and Hydrofera Blue to the right foot wound. He reports using the defender boot when he is at home. 3/29; patient presents for follow-up. He continues to be on antibiotics per podiatry. He has been using Hydrofera Blue and gentamicin ointment to the right plantar foot wound. He has no issues or complaints today. He  denies signs of infection. 4/5; patient presents for follow-up. He has been using Dakin's wet-to-dry dressings with improvement to wound healing. He denies signs of infection. She has no issues or complaints today. 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 Use Hydrofera Blue for a prolonged period of time and has been using Dakin's wet- to-dry I think for the last 2 to 3 weeks. He has a Psychologist, forensic at home but he does not wear that coming into the clinic because he drives. He tells me he has been compliant with the defender boot short of when he goes out to drive. He lives alone. He also tells me he has had a previous left great toe amputation We are following him for the right foot wound. 4/26; patient presents for follow-up. He had worsening of his left foot wound and had surgical debridement on 4/16 by Dr. Fanny Skates. He is being followed by podiatry for this issue. He he is on IV Unasyn based on tissue culture by ID.[name] We are following him for the right foot wound. He has been using Dakin's wet-to-dry dressings without issues. He reports continued usage of the defender boot. 5/3; patient presents for follow-up. He has been using Dakin's wet-to-dry dressings to the right foot wound. He has no issues or complaints today. He reports using the Psychologist, forensic. He reports offloading the foot wound when he is at home by sitting in a recliner. He denies signs of infection. He is still getting IV antibiotics For the left foot wound that is being followed by podiatry. He states the end date is later this month on 5/28. PEYTEN, WEARE ([id number]) 5/17; patient presents for follow-up. He missed his last clinic appointment. He has been using Dakin's wet-to-dry dressings. He states he is offloading the right foot wound with the defender boot although he does not have this today. He is still on IV antibiotics. He has no issues or complaints today. 5/24; patient presents for follow-up.  He has been using Hydrofera Blue to the wound bed without issues. He denies signs of infection. At last clinic visit he had a wound culture done that detected high levels of Enterococcus faecalis and low levels of Corynebacterium stratum and Staphylococcus epidermidis. Keystone antibiotics were ordered. He has not heard from the company yet. 5/31; patient presents for follow-up. He continues to use Hydrofera Blue to the wound bed. He denies signs of infection. Keystone antibiotics were ordered at last clinic visit and he states he called the company to pay for it. He states that it is being shipped. We rediscussed the total contact cast and he declines having this placed today. He states he is wearing the defender boot however he never wears it into the office. 04-06-2022 upon evaluation today patient appears to be doing about the same in regard to his wound. This is not significantly smaller. He is not wearing his offloading boot. We discussed that today he tells me that he wears it "all the time as he chuckled  and does not have it on during the office visit today. I think it is increasingly obvious that he is just messing around when he says this based on what I am seeing and otherwise I do not know why he would wear it all the time but not to the clinic. Either way my opinion based on what I am seeing currently is simply that the wound does seem to be still having quite a bit of callus buildup around the edges it is also very dry. He does tell me that he has been using the [facility] since he got it and to be honest I think this is good and should hopefully help keep things under control but at the same time I do not see any evidence of active infection. Again the Jodie Echevaria is a topical antibiotic with multiple compounds to help prevent further infection and worsening overall. 6/14; patient presents for follow-up. He has been using Keystone antibiotics with [employer]	[place] daily. He states he is wearing  his offloading boot. He does not have this in office today. He is going to the beach for the next 5 days. He states he Will not be using using any offloading device During this time. I again offered the total contact cast however he declined. I did ask him to reconsider for next clinic visit. He currently denies signs of infection. 6/21; patient presents for follow-up. He went to the beach last week. He did not use an offloading device. Wound is slightly deeper today. I again offered the total contact cast but he declined. Currently denies signs of infection. He reports using the [place], hydrofera blue and the Psychologist, forensic. 6/28; patient presents for follow-up. Patient declines total contact cast today. He has been using Keystone and [facility] to the wound bed. It is unclear if he is using the Psychologist, forensic. He would like a letter for the post man to delivers mail to his front door so he does not have to walk down the driveway. 7/5; patient presents for follow-up. He states he is considering the total contact cast however does not want this placed today. He is actually going to the gym after this appointment. He does not want offloading foot wear for this. He has been using Keystone and [facility] to the wound bed. He denies signs of infection. 7/12; patient presents for follow-up. He is still considering the total contact cast however does not want this today. He has been using Keystone antibiotic and Hydrofera Blue to the wound bed. He denies signs of infection. He has no issues or complaints today. 7/26; patient presents for follow-up. He declines a total contact cast. He has been using his an antibiotic and Hydrofera Blue to the wound bed. He denies systemic signs of infection. He is scheduled to discuss orthotics next week with his podiatrist. 8/2; patient presents for follow-up. He has been using Hydrofera Blue to the wound bed. He has no issues or complaints today. He declines a  total contact cast. He states he is getting orthotics today. 8/9; patient presents for follow-up. He has been using Hydrofera Blue to the wound bed. He again declines the total contact cast. He does state that he is considering starting it at the end of the month. He has a vacation coming up and does not want to have it in place for that. 8/16; patient presents for follow-up. He has been using Hydrofera Blue and Keystone to the wound bed. He started taking Augmentin and doxycycline  as prescribed. He denies signs of infection. He is going on his vacation next week and will be back in 2 weeks. He states he is considering the total contact cast for when he comes back from his trip. Objective Constitutional Vitals Time Taken: 8:33 AM, Height: 74 in, Weight: 226 lbs, BMI: 29, Temperature: 98.0 F, Pulse: 76 bpm, Respiratory Rate: 18 breaths/min, Blood Pressure: 114/66 mmHg. General Notes: Right foot: To the plantar aspect of the first metatarsal head there is an open wound with granulation tissue and significant undermining to the 7 11 o'clock position. No increased warmth, erythema or purulent drainage noted. Integumentary (Hair, Skin) Wound #1 status is Open. Original cause of wound was Blister. The date acquired was: 07/15/2020. The wound has been in treatment 31 weeks. The wound is located on the Right,Plantar Foot. The wound measures 0.9cm length x 0.6cm width x 0.5cm depth; 0.424cm^2 area and 0.212cm^3 volume. There is Fat Layer (Subcutaneous Tissue) exposed. There is no tunneling or undermining noted. There is a medium amount of serosanguineous drainage noted. The wound margin is thickened. There is large (67-100%) red granulation within the wound bed. There is a Rundle, Child ([id number]) small (1-33%) amount of necrotic tissue within the wound bed including Adherent Slough. 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 I debrided nonviable tissue. I recommended he continue his oral antibiotics and aggressively offload the wound bed. He has a Psychologist, forensic. But it is clear that he does not wear this. We rediscussed the total contact cast and he will think about doing this when he returns from his trip in 2 weeks. Continue Hydrofera Blue and Keystone antibiotics. I will add an additional week of oral antibiotics since he cannot follow-up next week and he is at high risk for infection and thus amputation. He has been tolerating the oral antibiotics well. Procedures Wound #1 Pre-procedure diagnosis of Wound #1 is a Diabetic Wound/Ulcer of the Lower Extremity located on the Right,Plantar Foot .Severity of Tissue Pre Debridement is: Fat layer exposed. There was a Excisional Skin/Subcutaneous Tissue Debridement with a total area of 0.54 sq cm performed by Geralyn Corwin, MD. With the following instrument(s): Curette to remove Viable and Non-Viable tissue/material. Material removed includes Callus, Subcutaneous Tissue, and Slough. No specimens were taken. A time out was conducted at 08:53, prior to the start of the procedure. A Minimum amount of bleeding was controlled with Pressure. The procedure was tolerated well. Post Debridement Measurements: 0.9cm length x 0.8cm width x 0.5cm depth; 0.283cm^3 volume. Character of Wound/Ulcer Post Debridement is stable. Severity of Tissue Post Debridement is: Fat layer exposed. Post procedure Diagnosis Wound #1: Same as Pre-Procedure Plan Follow-up Appointments: Return Appointment in 1 week. Nurse Visit as needed Bathing/ Shower/ Hygiene: May shower; gently cleanse wound with antibacterial soap, rinse and pat dry prior to dressing wounds No tub bath. Anesthetic  (Use 'Patient Medications' Section for Anesthetic Order Entry): Lidocaine applied to wound bed Edema Control - Lymphedema / Segmental Compressive Device / Other: Elevate, Exercise Daily and Avoid Standing for Long Periods of Time. Elevate leg(s) parallel to the floor when sitting. DO YOUR BEST to sleep in the bed at night. DO NOT sleep in your recliner. Long hours of sitting in a recliner leads to  swelling of the legs and/or potential wounds on your backside. Off-Loading: Foot Defender  - right foot-keep shin covered-keep pressure off of the wound (Patient is not wear boot to clinic each week. Wearing tennis shoe) Additional Orders / Instructions: Follow Nutritious Diet and Increase Protein Intake - monitor blood sugar to maintain normal limits Other: - Follow up post surgery for left foot-[facility] handling Medications-Please add to medication list.: P.O. Antibiotics - Continue keystone, pick up PO antibiotics please and start WOUND #1: - Foot Wound Laterality: Plantar, Right Cleanser: Wound Cleanser Every Other Day/30 Days Discharge Instructions: Wash your hands with soap and water. Remove old dressing, discard into plastic bag and place into trash. Cleanse the wound with Wound Cleanser prior to applying a clean dressing using gauze sponges, not tissues or cotton balls. Do not scrub or use excessive force. Pat dry using gauze sponges, not tissue or cotton balls. Topical: keystone Every Other Day/30 Days Discharge Instructions: Patient did not bring to clinic. Will do at home later tonight. Primary Dressing: Hydrofera Blue Ready Transfer Foam, 2.5x2.5 (in/in) Every Other Day/30 Days Discharge Instructions: Apply Hydrofera Blue Ready to wound bed as directed Fleener, Mujahid ([id number]) Secondary Dressing: Zetuvit Plus 4x4 (in/in) (DME) (Generic) Every Other Day/30 Days Secured With: Coban Cohesive Bandage 4x5 (yds) Stretched Every Other Day/30 Days Discharge Instructions: Apply  Coban as directed. 1. In office sharp debridement 2. Hydrofera Blue with Keystone antibiotic 3. Continue Augmentin and doxycycline 4. Follow-up in 2 weeks Electronic Signature(s) Signed: 06/15/2022 9:15:36 AM By: Geralyn Corwin DO Entered By: Geralyn Corwin on 06/15/2022 09:11:47 [name] ([id number]) -------------------------------------------------------------------------------- SuperBill Details Patient Name: [name] Date of Service: 06/15/2022 Medical Record Number: [id number] Patient Account Number: [id number] Date of Birth/Sex: 1956-02-05 (65 y.o. M) Treating RN: Huel Coventry Primary Care Provider: Manson Allan Other Clinician: Referring Provider: Manson Allan Treating Provider/Extender: Tilda Franco in Treatment: 31 Diagnosis Coding ICD-10 Codes Code Description [phone] 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ed E11.621 Type 2 diabetes mellitus with foot ulcer E11.40 Type 2 diabetes mellitus with diabetic neuropathy, unspecified R26.89 Other abnormalities of gait and mobility Facility Procedures CPT4 Code: [id number] Description: 11042 - DEB SUBQ TISSUE 20 SQ CM/< Modifier: Quantity: 1 CPT4 Code: Description: ICD-10 Diagnosis Description L97.512 Non-pressure chronic ulcer of other part of right foot with fat layer ex Modifier: posed Quantity: Physician Procedures CPT4 Code: [id number] Description: 11042 - WC PHYS SUBQ TISS 20 SQ CM Modifier: Quantity: 1 CPT4 Code: Description: ICD-10 Diagnosis Description L97.512 Non-pressure chronic ulcer of other part of right foot with fat layer ex Modifier: posed Quantity: Electronic Signature(s) Signed: 06/15/2022 9:15:36 AM By: Geralyn Corwin DO Entered By: Geralyn Corwin on 06/15/2022 09:12:00

## 2022-06-17 NOTE — Progress Notes (Signed)
KORI, COLIN ([id number]) Visit Report for 06/15/2022 Arrival Information Details Patient Name: [name], [name] Date of Service: 06/15/2022 8:30 AM Medical Record Number: [id number] Patient Account Number: [id number] Date of Birth/Sex: 04-18-1956 (66 y.o. M) Treating RN: Cornell Barman Primary Care Jospeh Mangel: [name] Other Clinician: Referring Vidalia Serpas: [name] Treating Mackenize Delgadillo/Extender: Yaakov Guthrie in Treatment: 68 Visit Information History Since Last Visit Added or deleted any medications: No Patient Arrived: Ambulatory Has Dressing in Place as Prescribed: Yes Arrival Time: 08:32 Pain Present Now: No Transfer Assistance: None Patient Identification Verified: Yes Secondary Verification Process Completed: Yes Patient Requires Transmission-Based No Precautions: Patient Has Alerts: Yes Patient Alerts: Patient on Blood Thinner DIABETIC Plavix Electronic Signature(s) Signed: 06/16/2022 7:54:04 AM By: Gretta Cool, BSN, RN, CWS, Kim RN, BSN Entered By: Gretta Cool, BSN, RN, CWS, Kim on 06/15/2022 08:33:00 [name] ([id number]) -------------------------------------------------------------------------------- Encounter Discharge Information Details Patient Name: [name] Date of Service: 06/15/2022 8:30 AM Medical Record Number: [id number] Patient Account Number: [id number] Date of Birth/Sex: 01-20-1956 (66 y.o. M) Treating RN: Cornell Barman Primary Care Mc Bloodworth: [name] Other Clinician: Referring Jossue Rubenstein: [name] Treating Maecie Sevcik/Extender: Yaakov Guthrie in Treatment: 72 Encounter Discharge Information Items Post Procedure Vitals Discharge Condition: Stable Temperature (F): 98.0 Ambulatory Status: Ambulatory Pulse (bpm): 76 Discharge Destination: Home Respiratory Rate (breaths/min): 18 Transportation: Private Auto Blood Pressure (mmHg): 114/66 Accompanied By: self Schedule Follow-up Appointment: Yes Clinical Summary of  Care: Electronic Signature(s) Signed: 06/16/2022 7:54:04 AM By: Gretta Cool, BSN, RN, CWS, Kim RN, BSN Entered By: Gretta Cool, BSN, RN, CWS, Kim on 06/15/2022 [place] [name] ([id number]) -------------------------------------------------------------------------------- Lower Extremity Assessment Details Patient Name: [name] Date of Service: 06/15/2022 8:30 AM Medical Record Number: [id number] Patient Account Number: [id number] Date of Birth/Sex: 1956-07-02 (66 y.o. M) Treating RN: Cornell Barman Primary Care Edina Winningham: [name] Other Clinician: Referring Blanca Carreon: [name] Treating Eleftheria Taborn/Extender: Yaakov Guthrie in Treatment: 31 Edema Assessment Assessed: [Left: No] [Right: Yes] Edema: [Left: N] [Right: o] Vascular Assessment Pulses: Dorsalis Pedis Palpable: [Right:Yes] Electronic Signature(s) Signed: 06/16/2022 7:54:04 AM By: Gretta Cool, BSN, RN, CWS, Kim RN, BSN Entered By: Gretta Cool, BSN, RN, CWS, Kim on 06/15/2022 08:40:01 [name] ([id number]) -------------------------------------------------------------------------------- Multi Wound Chart Details Patient Name: [name] Date of Service: 06/15/2022 8:30 AM Medical Record Number: [id number] Patient Account Number: [id number] Date of Birth/Sex: 03-Feb-1956 (66 y.o. M) Treating RN: Cornell Barman Primary Care Mallika Sanmiguel: [name] Other Clinician: Referring Niyonna Betsill: [name] Treating Eilyn Polack/Extender: Yaakov Guthrie in Treatment: 31 Vital Signs Height(in): 74 Pulse(bpm): 76 Weight(lbs): 226 Blood Pressure(mmHg): 114/66 Body Mass Index(BMI): 29 Temperature(F): 98.0 Respiratory Rate(breaths/min): 18 Photos: [N/A:N/A] Wound Location: Right, Plantar Foot N/A N/A Wounding Event: Blister N/A N/A Primary Etiology: Diabetic Wound/Ulcer of the Lower N/A N/A Extremity Comorbid History: Type II Diabetes, Osteoarthritis N/A N/A Date Acquired: 07/15/2020 N/A N/A Weeks of Treatment: 31 N/A  N/A Wound Status: Open N/A N/A Wound Recurrence: No N/A N/A Measurements L x W x D (cm) 0.9x0.6x0.5 N/A N/A Area (cm) : 0.424 N/A N/A Volume (cm) : 0.212 N/A N/A % Reduction in Area: 62.20% N/A N/A % Reduction in Volume: 68.50% N/A N/A Classification: Grade 2 N/A N/A Exudate Amount: Medium N/A N/A Exudate Type: Serosanguineous N/A N/A Exudate Color: red, brown N/A N/A Wound Margin: Thickened N/A N/A Granulation Amount: Large (67-100%) N/A N/A Granulation Quality: Red N/A N/A Necrotic Amount: Small (1-33%) N/A N/A Exposed Structures: Fat Layer (Subcutaneous Tissue): N/A N/A Yes Fascia: No Tendon: No Muscle: No Joint: No Bone: No Epithelialization: None N/A N/A Treatment Notes Electronic Signature(s)  Signed: 06/16/2022 7:54:04 AM By: Gretta Cool, BSN, RN, CWS, Kim RN, BSN Entered By: Gretta Cool, BSN, RN, CWS, Kim on 06/15/2022 08:52:54 [name] ([id number]) -------------------------------------------------------------------------------- Multi-Disciplinary Care Plan Details Patient Name: [name] Date of Service: 06/15/2022 8:30 AM Medical Record Number: [id number] Patient Account Number: [id number] Date of Birth/Sex: 1956/01/25 (66 y.o. M) Treating RN: Cornell Barman Primary Care Olon Russ: [name] Other Clinician: Referring Oaklee Esther: [name] Treating Traevon Meiring/Extender: Yaakov Guthrie in Treatment: 27 Active Inactive Wound/Skin Impairment Nursing Diagnoses: Impaired tissue integrity Knowledge deficit related to smoking impact on wound healing Knowledge deficit related to ulceration/compromised skin integrity Goals: Patient/caregiver will verbalize understanding of skin care regimen Date Initiated: 11/10/2021 Date Inactivated: 12/01/2021 Target Resolution Date: 11/17/2021 Goal Status: Met Ulcer/skin breakdown will have a volume reduction of 30% by week 4 Date Initiated: 11/10/2021 Date Inactivated: 02/02/2022 Target Resolution Date: 12/08/2021 Goal Status:  Met Ulcer/skin breakdown will have a volume reduction of 50% by week 8 Date Initiated: 11/10/2021 Target Resolution Date: 01/05/2022 Goal Status: Active Ulcer/skin breakdown will have a volume reduction of 80% by week 12 Date Initiated: 11/10/2021 Target Resolution Date: 02/02/2022 Goal Status: Active Ulcer/skin breakdown will heal within 14 weeks Date Initiated: 11/10/2021 Target Resolution Date: 02/16/2022 Goal Status: Active Interventions: Assess patient/caregiver ability to obtain necessary supplies Assess patient/caregiver ability to perform ulcer/skin care regimen upon admission and as needed Assess ulceration(s) every visit Notes: Electronic Signature(s) Signed: 06/16/2022 7:54:04 AM By: Gretta Cool, BSN, RN, CWS, Kim RN, BSN Entered By: Gretta Cool, BSN, RN, CWS, Kim on 06/15/2022 08:40:08 [name] ([id number]) -------------------------------------------------------------------------------- Pain Assessment Details Patient Name: [name] Date of Service: 06/15/2022 8:30 AM Medical Record Number: [id number] Patient Account Number: [id number] Date of Birth/Sex: [birth year] (66 y.o. M) Treating RN: Cornell Barman Primary Care Bethaney Oshana: [name] Other Clinician: Referring Jermanie Minshall: [name] Treating Shahida Schnackenberg/Extender: Yaakov Guthrie in Treatment: 20 Active Problems Location of Pain Severity and Description of Pain Patient Has Paino Yes Site Locations Rate the pain. Current Pain Level: 5 Pain Management and Medication Current Pain Management: Electronic Signature(s) Signed: 06/16/2022 7:54:04 AM By: Gretta Cool, BSN, RN, CWS, Kim RN, BSN Entered By: Gretta Cool, BSN, RN, CWS, Kim on 06/15/2022 08:34:40 [name] ([id number]) -------------------------------------------------------------------------------- Patient/Caregiver Education Details Patient Name: [name] Date of Service: 06/15/2022 8:30 AM Medical Record Number: [id number] Patient Account Number:  [id number] Date of Birth/Gender: [birth year] (66 y.o. M) Treating RN: Cornell Barman Primary Care Physician: [name] Other Clinician: Referring Physician: [name] Treating Physician/Extender: Yaakov Guthrie in Treatment: 46 Education Assessment Education Provided To: Patient Education Topics Provided Offloading: Handouts: Other: You should be offioading at all times. Methods: Explain/Verbal Responses: State content correctly Wound Debridement: Handouts: Wound Debridement Methods: Demonstration, Explain/Verbal Responses: State content correctly Electronic Signature(s) Signed: 06/16/2022 7:54:04 AM By: Gretta Cool, BSN, RN, CWS, Kim RN, BSN Entered By: Gretta Cool, BSN, RN, CWS, Kim on 06/15/2022 08:57:37 [name] ([id number]) -------------------------------------------------------------------------------- Wound Assessment Details Patient Name: [name] Date of Service: 06/15/2022 8:30 AM Medical Record Number: [id number] Patient Account Number: [id number] Date of Birth/Sex: 04-21-1956 (66 y.o. M) Treating RN: Cornell Barman Primary Care Kyan Giannone: [name] Other Clinician: Referring Travontae Freiberger: [name] Treating Lunah Losasso/Extender: Yaakov Guthrie in Treatment: 31 Wound Status Wound Number: 1 Primary Etiology: Diabetic Wound/Ulcer of the Lower Extremity Wound Location: Right, Plantar Foot Wound Status: Open Wounding Event: Blister Comorbid History: Type II Diabetes, Osteoarthritis Date Acquired: 07/15/2020 Weeks Of Treatment: 31 Clustered Wound: No Photos Wound Measurements Length: (cm) 0.9 Width: (cm) 0.6 Depth: (cm) 0.5 Area: (cm) 0.424 Volume: (cm)  0.212 % Reduction in Area: 62.2% % Reduction in Volume: 68.5% Epithelialization: None Tunneling: No Undermining: No Wound Description Classification: Grade 2 Wound Margin: Thickened Exudate Amount: Medium Exudate Type: Serosanguineous Exudate Color: red, brown Foul Odor After  Cleansing: No Slough/Fibrino Yes Wound Bed Granulation Amount: Large (67-100%) Exposed Structure Granulation Quality: Red Fascia Exposed: No Necrotic Amount: Small (1-33%) Fat Layer (Subcutaneous Tissue) Exposed: Yes Necrotic Quality: Adherent Slough Tendon Exposed: No Muscle Exposed: No Joint Exposed: No Bone Exposed: No Treatment Notes Wound #1 (Foot) Wound Laterality: Plantar, Right Cleanser Wound Cleanser Discharge Instruction: Wash your hands with soap and water. Remove old dressing, discard into plastic bag and place into trash. Cleanse the wound with Wound Cleanser prior to applying a clean dressing using gauze sponges, not tissues or cotton balls. Do not scrub or use excessive force. Pat dry using gauze sponges, not tissue or cotton balls. JAVARIAN, JAKUBIAK ([id number]) Peri-Wound Care Topical keystone Discharge Instruction: Patient did not bring to clinic. Will do at home later tonight. Primary Dressing Hydrofera Blue Ready Transfer Foam, 2.5x2.5 (in/in) Discharge Instruction: Apply Hydrofera Blue Ready to wound bed as directed Secondary Dressing Zetuvit Plus 4x4 (in/in) Secured With Coban Cohesive Bandage 4x5 (yds) Stretched Discharge Instruction: Apply Coban as directed. Compression Wrap Compression Stockings Add-Ons Electronic Signature(s) Signed: 06/16/2022 7:54:04 AM By: Gretta Cool, BSN, RN, CWS, Kim RN, BSN Entered By: Gretta Cool, BSN, RN, CWS, Kim on 06/15/2022 08:39:43 [name] ([id number]) -------------------------------------------------------------------------------- Vitals Details Patient Name: [name] Date of Service: 06/15/2022 8:30 AM Medical Record Number: [id number] Patient Account Number: [id number] Date of Birth/Sex: [birth year] (66 y.o. M) Treating RN: Cornell Barman Primary Care Irma Roulhac: [name] Other Clinician: Referring Khadijatou Borak: [name] Treating Kenna Seward/Extender: Yaakov Guthrie in Treatment: 31 Vital Signs Time Taken:  08:33 Temperature (F): 98.0 Height (in): 74 Pulse (bpm): 76 Weight (lbs): 226 Respiratory Rate (breaths/min): 18 Body Mass Index (BMI): 29 Blood Pressure (mmHg): 114/66 Reference Range: 80 - 120 mg / dl Electronic Signature(s) Signed: 06/16/2022 7:54:04 AM By: Gretta Cool, BSN, RN, CWS, Kim RN, BSN Entered By: Gretta Cool, BSN, RN, CWS, Kim on 06/15/2022 05:05:67

## 2022-06-22 ENCOUNTER — Encounter: Payer: Medicare Other | Admitting: Internal Medicine

## 2022-06-29 ENCOUNTER — Other Ambulatory Visit: Payer: Self-pay | Admitting: Internal Medicine

## 2022-06-29 ENCOUNTER — Encounter (HOSPITAL_BASED_OUTPATIENT_CLINIC_OR_DEPARTMENT_OTHER): Payer: Medicare Other | Admitting: Internal Medicine

## 2022-06-29 ENCOUNTER — Encounter: Payer: Self-pay | Admitting: Podiatry

## 2022-06-29 DIAGNOSIS — L97512 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ed: Secondary | ICD-10-CM

## 2022-06-29 DIAGNOSIS — E1140 Type 2 diabetes mellitus with diabetic neuropathy, unspecified: Secondary | ICD-10-CM | POA: Diagnosis not present

## 2022-06-29 DIAGNOSIS — E11621 Type 2 diabetes mellitus with foot ulcer: Secondary | ICD-10-CM | POA: Diagnosis not present

## 2022-06-29 DIAGNOSIS — R2689 Other abnormalities of gait and mobility: Secondary | ICD-10-CM | POA: Diagnosis not present

## 2022-06-29 NOTE — Progress Notes (Addendum)
JARETT, DRALLE ([id number]) Visit Report for 06/29/2022 Chief Complaint Document Details Patient Name: [name], [name] Date of Service: 06/29/2022 8:15 AM Medical Record Number: [id number] Patient Account Number: [id number] Date of Birth/Sex: Jan 18, 1956 (66 y.o. Male) Treating RN: Huel Coventry Primary Care Provider: Manson Allan Other Clinician: Betha Loa Referring Provider: Manson Allan Treating Provider/Extender: Tilda Franco in Treatment: 79 Information Obtained from: Patient Chief Complaint Right plantar foot wound Electronic Signature(s) Signed: 06/29/2022 9:13:00 AM By: Geralyn Corwin DO Entered By: Geralyn Corwin on 06/29/2022 08:52:55 [name] ([id number]) -------------------------------------------------------------------------------- HPI Details Patient Name: [name] Date of Service: 06/29/2022 8:15 AM Medical Record Number: [id number] Patient Account Number: [id number] Date of Birth/Sex: 05-30-1956 (66 y.o. Male) Treating RN: Huel Coventry Primary Care Provider: Manson Allan Other Clinician: Betha Loa Referring Provider: Manson Allan Treating Provider/Extender: Tilda Franco in Treatment: 82 History of Present Illness HPI Description: Admission 11/10/2021 [name] is a 66 year old male with a past medical history of uncontrolled type 2 diabetes with last hemoglobin A1c of 8.1 complicated by peripheral neuropathy that presents to the clinic for a 2-year history of nonhealing ulcer to the bottom of his right foot. He states this started out as a blister caused by a work boot. He reports receiving wound care when he resided in [place]. He is reestablishing his wound care in [facility] today. He is currently keeping the area clean and covered. He has insoles designed to help offload the wound bed. He currently denies signs of infection. 1/18; patient presents for follow-up. He has been using Hydrofera Blue to  the wound bed. He has no issues or complaints today. He has not received the defender.[name] He denies signs of infection. 2/1; patient presents for follow-up. He has been using Hydrofera Blue to the wound bed he states he received the defender boot and has been using it however he did not have it on today. He currently denies signs of infection to the right foot. Unfortunately he developed a wound to the left great toe over the past week. He states he received new orthotics and these caused a blister to the left great toe which turned into a wound. He has not been doing anything to the wound bed. He currently denies systemic signs of infection. 2/8; Patient presents for follow-up. 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 He states he is about 90% recovered. He has some mild weakness to the left side. He reports taking the antibiotics prescribed at last clinic visit. He reports using Hydrofera Blue for dressing changes. He only uses the offloading boot when he is at home. He uses open toed shoes to the right foot. He currently denies signs of infection. 2/15; patient presents for follow-up. He states he is still taking the antibiotics prescribed. He reports using the defender boot to the right foot and open toed shoe to the left foot however he is not wearing either today. He currently denies systemic signs of infection. He stated he cut down a tree last week and was outside doing yard work. 2/22; patient presents for follow-up. 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 He reports feeling well. He reports using the defender boot to the right leg and continues to use Hydrofera Blue with dressing changes. He denies systemic signs of infection. 3/8; patient presents for follow-up. He sees Dr. Alberteen Spindle tomorrow for  follow-up of the left great toe amputation. This was  a partial amputation. He reports he is taking Augmentin currently. He reports increased erythema to the left great toe amputation site. He also reports a decline in his right plantar foot wound. He reports it is draining more. He denies purulent drainage. 3/15; patient presents for follow-up. He did not see Dr. Alberteen Spindle last week but states he sees him today. He states he has been taking doxycycline in the past week and started gentamicin ointment. He continues to use [facility] with dressing changes. He currently denies systemic signs of infection. 3/22; patient presents for follow-up. Patient had partial amputation of the left great toe on 2/15. 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 Alberteen Spindle. He reports no issues and has no complaints. He is currently taking Keflex and doxycycline prescribed by Dr. Alberteen Spindle. He continues to use gentamicin ointment and Hydrofera Blue to the right foot wound. He reports using the defender boot when he is at home. 3/29; patient presents for follow-up. He continues to be on antibiotics per podiatry. He has been using Hydrofera Blue and gentamicin ointment to the right plantar foot wound. He has no issues or complaints today. He denies signs of infection. 4/5; patient presents for follow-up. He has been using Dakin's wet-to-dry dressings with improvement to wound healing. He denies signs of infection. She has no issues or complaints today. 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 Use Hydrofera Blue for a prolonged period of time and has been using Dakin's wet- to-dry I think for the last 2 to 3 weeks. He has a Psychologist, forensic at home but he does not wear that coming into the clinic because he drives. He tells me he has been compliant with the defender boot short of when he goes out to drive. He lives alone. He also tells me he has had a previous left great toe amputation We are following him for the right foot  wound. 4/26; patient presents for follow-up. He had worsening of his left foot wound and had surgical debridement on 4/16 by Dr. Fanny Skates. He is being followed by podiatry for this issue. He he is on IV Unasyn based on tissue culture by ID.[name] We are following him for the right foot wound. He has been using Dakin's wet-to-dry dressings without issues. He reports continued usage of the defender boot. 5/3; patient presents for follow-up. He has been using Dakin's wet-to-dry dressings to the right foot wound. He has no issues or complaints today. He reports using the Psychologist, forensic. He reports offloading the foot wound when he is at home by sitting in a recliner. He denies signs of infection. He is still getting IV antibiotics For the left foot wound that is being followed by podiatry. He states the end date is later this month on 5/28. 5/17; patient presents for follow-up. He missed his last clinic appointment. He has been using Dakin's wet-to-dry dressings. He states he is offloading the right foot wound with the defender boot although he does not have this today. He is still on IV antibiotics. He has no issues or complaints today. 5/24; patient presents for follow-up. He has been using Hydrofera Blue to the wound bed without issues. He denies signs of infection. At last clinic [facility], [facility] ([id number]) visit he had a wound culture done that detected high levels of Enterococcus faecalis and low levels of Corynebacterium stratum and Staphylococcus epidermidis. [facility]stone antibiotics were ordered. He  has not heard from the company yet. 5/31; patient presents for follow-up. He continues to use Hydrofera Blue to the wound bed. He denies signs of infection. Keystone antibiotics were ordered at last clinic visit and he states he called the company to pay for it. He states that it is being shipped. We rediscussed the total contact cast and he declines having this placed today. He states he is wearing the  defender boot however he never wears it into the office. 04-06-2022 upon evaluation today patient appears to be doing about the same in regard to his wound. This is not significantly smaller. He is not wearing his offloading boot. We discussed that today he tells me that he wears it "all the time as he chuckled and does not have it on during the office visit today. I think it is increasingly obvious that he is just messing around when he says this based on what I am seeing and otherwise I do not know why he would wear it all the time but not to the clinic. Either way my opinion based on what I am seeing currently is simply that the wound does seem to be still having quite a bit of callus buildup around the edges it is also very dry. He does tell me that he has been using the [facility] since he got it and to be honest I think this is good and should hopefully help keep things under control but at the same time I do not see any evidence of active infection. Again the Jodie Echevaria is a topical antibiotic with multiple compounds to help prevent further infection and worsening overall. 6/14; patient presents for follow-up. He has been using Keystone antibiotics with [employer]	[place] daily. He states he is wearing his offloading boot. He does not have this in office today. He is going to the beach for the next 5 days. He states he Will not be using using any offloading device During this time. I again offered the total contact cast however he declined. I did ask him to reconsider for next clinic visit. He currently denies signs of infection. 6/21; patient presents for follow-up. He went to the beach last week. He did not use an offloading device. Wound is slightly deeper today. I again offered the total contact cast but he declined. Currently denies signs of infection. He reports using the [place], hydrofera blue and the Psychologist, forensic. 6/28; patient presents for follow-up. Patient declines total contact cast  today. He has been using Keystone and [facility] to the wound bed. It is unclear if he is using the Psychologist, forensic. He would like a letter for the post man to delivers mail to his front door so he does not have to walk down the driveway. 7/5; patient presents for follow-up. He states he is considering the total contact cast however does not want this placed today. He is actually going to the gym after this appointment. He does not want offloading foot wear for this. He has been using [facility] and [facility] to the wound bed. He denies signs of infection. 7/12; patient presents for follow-up. He is still considering the total contact cast however does not want this today. He has been using Keystone antibiotic and Hydrofera Blue to the wound bed. He denies signs of infection. He has no issues or complaints today. 7/26; patient presents for follow-up. He declines a total contact cast. He has been using his an antibiotic and Hydrofera Blue to the wound bed. He  denies systemic signs of infection. He is scheduled to discuss orthotics next week with his podiatrist. 8/2; patient presents for follow-up. He has been using Hydrofera Blue to the wound bed. He has no issues or complaints today. He declines a total contact cast. He states he is getting orthotics today. 8/9; patient presents for follow-up. He has been using Hydrofera Blue to the wound bed. He again declines the total contact cast. He does state that he is considering starting it at the end of the month. He has a vacation coming up and does not want to have it in place for that. 8/16; patient presents for follow-up. He has been using Hydrofera Blue and [facility] to the wound bed. He started taking Augmentin and doxycycline as prescribed. He denies signs of infection. He is going on his vacation next week and will be back in 2 weeks. He states he is considering the total contact cast for when he comes back from his trip. 8/30; patient present  for follow-up. He has been using Hydrofera Blue and [facility] to the wound bed. He continues to take doxycycline and Augmentin without issues. He went on vacation and reports walking for long periods of time without pressure relief to the wound bed. He currently denies systemic signs of infection. Electronic Signature(s) Signed: 06/29/2022 9:13:00 AM By: Geralyn Corwin DO Entered By: Geralyn Corwin on 06/29/2022 08:58:14 [name] ([id number]) -------------------------------------------------------------------------------- Physical Exam Details Patient Name: [name] Date of Service: 06/29/2022 8:15 AM Medical Record Number: [id number] Patient Account Number: [id number] Date of Birth/Sex: 13-May-1956 (66 y.o. Male) Treating RN: Huel Coventry Primary Care Provider: Manson Allan Other Clinician: Betha Loa Referring Provider: Manson Allan Treating Provider/Extender: Tilda Franco in Treatment: 27 Constitutional . Cardiovascular . Psychiatric . Notes Right foot: To the plantar aspect of the first metatarsal head there is an open wound with granulation tissue and significant undermining to the 7o 11 o'clock position. This area probes close to bone. No increased warmth, erythema or purulent drainage noted. Electronic Signature(s) Signed: 06/29/2022 9:13:00 AM By: Geralyn Corwin DO Entered By: Geralyn Corwin on 06/29/2022 08:59:05 [name] ([id number]) -------------------------------------------------------------------------------- Physician Orders Details Patient Name: [name] Date of Service: 06/29/2022 8:15 AM Medical Record Number: [id number] Patient Account Number: [id number] Date of Birth/Sex: 1956-10-04 (66 y.o. Male) Treating RN: Huel Coventry Primary Care Provider: Manson Allan Other Clinician: Betha Loa Referring Provider: Manson Allan Treating Provider/Extender: Tilda Franco in Treatment: 64 Verbal / Phone Orders:  No Diagnosis Coding Follow-up Appointments o Return Appointment in 1 week. o Nurse Visit as needed Bathing/ Shower/ Hygiene o May shower; gently cleanse wound with antibacterial soap, rinse and pat dry prior to dressing wounds o No tub bath. Anesthetic (Use 'Patient Medications' Section for Anesthetic Order Entry) o Lidocaine applied to wound bed Edema Control - Lymphedema / Segmental Compressive Device / Other o Elevate, Exercise Daily and Avoid Standing for Long Periods of Time. o Elevate leg(s) parallel to the floor when sitting. o DO YOUR BEST to sleep in the bed at night. DO NOT sleep in your recliner. Long hours of sitting in a recliner leads to swelling of the legs and/or potential wounds on your backside. Off-Loading o Foot Defender o - right foot-keep shin covered-keep pressure off of the wound (Patient is not wear boot to clinic each week. Wearing tennis shoe) Additional Orders / Instructions o Follow Nutritious Diet and Increase Protein Intake - monitor blood sugar to maintain normal limits o Other: - Follow up post surgery for left  foot-[facility] handling Medications-Please add to medication list. o P.O. Antibiotics - Continue keystone and oral antibiotics Wound Treatment Wound #1 - Foot Wound Laterality: Plantar, Right Cleanser: Wound Cleanser Every Other Day/30 Days Discharge Instructions: Wash your hands with soap and water. Remove old dressing, discard into plastic bag and place into trash. Cleanse the wound with Wound Cleanser prior to applying a clean dressing using gauze sponges, not tissues or cotton balls. Do not scrub or use excessive force. Pat dry using gauze sponges, not tissue or cotton balls. Topical: keystone Every Other Day/30 Days Discharge Instructions: Patient did not bring to clinic. Will do at home later tonight. Primary Dressing: Hydrofera Blue Ready Transfer Foam, 2.5x2.5 (in/in) Every Other Day/30 Days Discharge  Instructions: Apply Hydrofera Blue Ready to wound bed as directed Secondary Dressing: Zetuvit Plus 4x4 (in/in) (Generic) Every Other Day/30 Days Secured With: Coban Cohesive Bandage 4x5 (yds) Stretched Every Other Day/30 Days Discharge Instructions: Apply Coban as directed. Radiology o MRI with and without Contrast - MRI w/wo contrast right foot ordered Electronic Signature(s) Signed: 06/29/2022 1:04:39 PM By: Geralyn Corwin DO Signed: 06/30/2022 4:44:47 PM By: Betha Loa Previous Signature: 06/29/2022 9:13:00 AM Version By: Ledell Peoples, Padraig ([id number]) Entered By: Betha Loa on 06/29/2022 09:56:12 [name] ([id number]) -------------------------------------------------------------------------------- Problem List Details Patient Name: [name] Date of Service: 06/29/2022 8:15 AM Medical Record Number: [id number] Patient Account Number: [id number] Date of Birth/Sex: April 24, 1956 (66 y.o. Male) Treating RN: Huel Coventry Primary Care Provider: Manson Allan Other Clinician: Betha Loa Referring Provider: Manson Allan Treating Provider/Extender: Tilda Franco in Treatment: 13 Active Problems ICD-10 Encounter Code Description Active Date MDM Diagnosis L97.512 Non-pressure chronic ulcer of other part of right foot with fat layer 11/10/2021 No Yes exposed E11.621 Type 2 diabetes mellitus with foot ulcer 11/10/2021 No Yes E11.40 Type 2 diabetes mellitus with diabetic neuropathy, unspecified 11/10/2021 No Yes R26.89 Other abnormalities of gait and mobility 11/10/2021 No Yes Inactive Problems ICD-10 Code Description Active Date Inactive Date L97.528 Non-pressure chronic ulcer of other part of left foot with other specified 12/01/2021 12/01/2021 severity Resolved Problems Electronic Signature(s) Signed: 06/29/2022 9:13:00 AM By: Geralyn Corwin DO Entered By: Geralyn Corwin on 06/29/2022 08:52:50 [name]  ([id number]) -------------------------------------------------------------------------------- Progress Note Details Patient Name: [name] Date of Service: 06/29/2022 8:15 AM Medical Record Number: [id number] Patient Account Number: [id number] Date of Birth/Sex: Oct 21, 1956 (66 y.o. Male) Treating RN: Huel Coventry Primary Care Provider: Manson Allan Other Clinician: Betha Loa Referring Provider: Manson Allan Treating Provider/Extender: Tilda Franco in Treatment: 33 Subjective Chief Complaint Information obtained from Patient Right plantar foot wound History of Present Illness (HPI) Admission 11/10/2021 Mr. Jadin Creque is a 66 year old male with a past medical history of uncontrolled type 2 diabetes with last hemoglobin A1c of 8.1 complicated by peripheral neuropathy that presents to the clinic for a 2-year history of nonhealing ulcer to the bottom of his right foot. He states this started out as a blister caused by a work boot. He reports receiving wound care when he resided in [place]. He is reestablishing his wound care in [facility] today. He is currently keeping the area clean and covered. He has insoles designed to help offload the wound bed. He currently denies signs of infection. 1/18; patient presents for follow-up. He has been using Hydrofera Blue to the wound bed. He has no issues or complaints today. He has not received the defender.[name] He denies signs of infection. 2/1; patient presents for follow-up. He has been  using Hydrofera Blue to the wound bed he states he received the defender boot and has been using it however he did not have it on today. He currently denies signs of infection to the right foot. Unfortunately he developed a wound to the left great toe over the past week. He states he received new orthotics and these caused a blister to the left great toe which turned into a wound. He has not been doing anything to the wound bed. He  currently denies systemic signs of infection. 2/8; Patient presents for follow-up. 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 He states he is about 90% recovered. He has some mild weakness to the left side. He reports taking the antibiotics prescribed at last clinic visit. He reports using Hydrofera Blue for dressing changes. He only uses the offloading boot when he is at home. He uses open toed shoes to the right foot. He currently denies signs of infection. 2/15; patient presents for follow-up. He states he is still taking the antibiotics prescribed. He reports using the defender boot to the right foot and open toed shoe to the left foot however he is not wearing either today. He currently denies systemic signs of infection. He stated he cut down a tree last week and was outside doing yard work. 2/22; patient presents for follow-up. 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 He reports feeling well. He reports using the defender boot to the right leg and continues to use Hydrofera Blue with dressing changes. He denies systemic signs of infection. 3/8; patient presents for follow-up. He sees Dr. Alberteen Spindleline tomorrow for follow-up of the left great toe amputation. This was a partial amputation. He reports he is taking Augmentin currently. He reports increased erythema to the left great toe amputation site. He also reports a decline in his right plantar foot wound. He reports it is draining more. He denies purulent drainage. 3/15; patient presents for follow-up. He did not see Dr. Alberteen Spindleline last week but states he sees him today. He states he has been taking doxycycline in the past week and started gentamicin ointment. [facility]. He currently denies systemic signs of infection. 3/22; patient presents for follow-up.  Patient had partial amputation of the left great toe on 2/15. 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 Alberteen Spindleline. He reports no issues and has no complaints. He is currently taking Keflex and doxycycline prescribed by Dr. Alberteen Spindleline. He continues to use gentamicin ointment and Hydrofera Blue to the right foot wound. He reports using the defender boot when he is at home. 3/29; patient presents for follow-up. He continues to be on antibiotics per podiatry. He has been using Hydrofera Blue and gentamicin ointment to the right plantar foot wound. He has no issues or complaints today. He denies signs of infection. 4/5; patient presents for follow-up. He has been using Dakin's wet-to-dry dressings with improvement to wound healing. He denies signs of infection. She has no issues or complaints today. 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 Use Hydrofera Blue for a prolonged period of time and has been using Dakin's wet- to-dry I think for the last 2 to 3 weeks. He has a Psychologist, forensicdefender boot at home but he does not wear that coming into the clinic because  he drives. He tells me he has been compliant with the defender boot short of when he goes out to drive. He lives alone. He also tells me he has had a previous left great toe amputation We are following him for the right foot wound. 4/26; patient presents for follow-up. He had worsening of his left foot wound and had surgical debridement on 4/16 by Dr. Fanny Skates. He is being followed by podiatry for this issue. He he is on IV Unasyn based on tissue culture by ID.[name] We are following him for the right foot wound. He has been using Dakin's wet-to-dry dressings without issues. He reports continued usage of the defender boot. 5/3; patient presents for follow-up. He has been using Dakin's wet-to-dry dressings to the right foot wound. He has no issues or complaints today. He reports using the Psychologist, forensic. He reports  offloading the foot wound when he is at home by sitting in a recliner. He denies signs of infection. He is still getting IV antibiotics For the left foot wound that is being followed by podiatry. He states the end date is later this month on 5/28. ELIAKIM, TENDLER ([id number]) 5/17; patient presents for follow-up. He missed his last clinic appointment. He has been using Dakin's wet-to-dry dressings. He states he is offloading the right foot wound with the defender boot although he does not have this today. He is still on IV antibiotics. He has no issues or complaints today. 5/24; patient presents for follow-up. He has been using Hydrofera Blue to the wound bed without issues. He denies signs of infection. At last clinic visit he had a wound culture done that detected high levels of Enterococcus faecalis and low levels of Corynebacterium stratum and Staphylococcus epidermidis. Keystone antibiotics were ordered. He has not heard from the company yet. 5/31; patient presents for follow-up. He continues to use Hydrofera Blue to the wound bed. He denies signs of infection. Keystone antibiotics were ordered at last clinic visit and he states he called the company to pay for it. He states that it is being shipped. We rediscussed the total contact cast and he declines having this placed today. He states he is wearing the defender boot however he never wears it into the office. 04-06-2022 upon evaluation today patient appears to be doing about the same in regard to his wound. This is not significantly smaller. He is not wearing his offloading boot. We discussed that today he tells me that he wears it "all the time as he chuckled and does not have it on during the office visit today. I think it is increasingly obvious that he is just messing around when he says this based on what I am seeing and otherwise I do not know why he would wear it all the time but not to the clinic. Either way my opinion based on what I am  seeing currently is simply that the wound does seem to be still having quite a bit of callus buildup around the edges it is also very dry. He does tell me that he has been using the [facility] since he got it and to be honest I think this is good and should hopefully help keep things under control but at the same time I do not see any evidence of active infection. Again the Jodie Echevaria is a topical antibiotic with multiple compounds to help prevent further infection and worsening overall. 6/14; patient presents for follow-up. He has been using Keystone antibiotics with [employer]	[place] daily. He states  he is wearing his offloading boot. He does not have this in office today. He is going to the beach for the next 5 days. He states he Will not be using using any offloading device During this time. I again offered the total contact cast however he declined. I did ask him to reconsider for next clinic visit. He currently denies signs of infection. 6/21; patient presents for follow-up. He went to the beach last week. He did not use an offloading device. Wound is slightly deeper today. I again offered the total contact cast but he declined. Currently denies signs of infection. He reports using the [place], hydrofera blue and the Psychologist, forensic. 6/28; patient presents for follow-up. Patient declines total contact cast today. He has been using Keystone and [facility] to the wound bed. It is unclear if he is using the Psychologist, forensic. He would like a letter for the post man to delivers mail to his front door so he does not have to walk down the driveway. 7/5; patient presents for follow-up. He states he is considering the total contact cast however does not want this placed today. He is actually going to the gym after this appointment. He does not want offloading foot wear for this. He has been using Keystone and [employer] to the wound bed. He denies signs of infection. 7/12; patient presents for follow-up.  He is still considering the total contact cast however does not want this today. He has been using Keystone antibiotic and Hydrofera Blue to the wound bed. He denies signs of infection. He has no issues or complaints today. 7/26; patient presents for follow-up. He declines a total contact cast. He has been using his an antibiotic and Hydrofera Blue to the wound bed. He denies systemic signs of infection. He is scheduled to discuss orthotics next week with his podiatrist. 8/2; patient presents for follow-up. He has been using Hydrofera Blue to the wound bed. He has no issues or complaints today. He declines a total contact cast. He states he is getting orthotics today. 8/9; patient presents for follow-up. He has been using Hydrofera Blue to the wound bed. He again declines the total contact cast. He does state that he is considering starting it at the end of the month. He has a vacation coming up and does not want to have it in place for that. 8/16; patient presents for follow-up. He has been using Hydrofera Blue and Keystone to the wound bed. He started taking Augmentin and doxycycline as prescribed. He denies signs of infection. He is going on his vacation next week and will be back in 2 weeks. He states he is considering the total contact cast for when he comes back from his trip. 8/30; patient present for follow-up. He has been using Hydrofera Blue and Keystone to the wound bed. He continues to take doxycycline and Augmentin without issues. He went on vacation and reports walking for long periods of time without pressure relief to the wound bed. He currently denies systemic signs of infection. Objective Constitutional Vitals Time Taken: 8:12 AM, Height: 74 in, Weight: 226 lbs, BMI: 29, Temperature: 98.1 F, Pulse: 102 bpm, Respiratory Rate: 18 breaths/min, Blood Pressure: 122/78 mmHg. General Notes: Right foot: To the plantar aspect of the first metatarsal head there is an open wound with  granulation tissue and significant undermining to the 7 11 o'clock position. This area probes close to bone. No increased warmth, erythema or purulent drainage noted. Integumentary (Hair, Skin) Buechele, Ronel (  [id number]) Wound #1 status is Open. Original cause of wound was Blister. The date acquired was: 07/15/2020. The wound has been in treatment 33 weeks. The wound is located on the Right,Plantar Foot. The wound measures 1.2cm length x 0.6cm width x 0.3cm depth; 0.565cm^2 area and 0.17cm^3 volume. There is Fat Layer (Subcutaneous Tissue) exposed. There is no tunneling or undermining noted. There is a medium amount of serosanguineous drainage noted. The wound margin is thickened. There is large (67-100%) red granulation within the wound bed. There is a small (1-33%) amount of necrotic tissue within the wound bed including Adherent Slough. 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 He has been putting more pressure on the wound bed in the past week due to being on vacation. He declines the total contact cast today. He has changed his mind about having the total contact cast and states that he may never want to do this. I discussed that his wound will not heal without pressure relief and as a result he is at high risk for amputation and he expressed complete understanding. Since the wound now probes close to bone I recommended an MRI to assess for osteomyelitis. If no osteomyelitis he may benefit from a skin substitute. I recommended he complete his antibiotic course of Augmentin and doxycycline. Follow up in one week. Plan Follow-up Appointments: Return Appointment in 1 week. Nurse Visit as needed Bathing/  Shower/ Hygiene: May shower; gently cleanse wound with antibacterial soap, rinse and pat dry prior to dressing wounds No tub bath. Anesthetic (Use 'Patient Medications' Section for Anesthetic Order Entry): Lidocaine applied to wound bed Edema Control - Lymphedema / Segmental Compressive Device / Other: Elevate, Exercise Daily and Avoid Standing for Long Periods of Time. Elevate leg(s) parallel to the floor when sitting. DO YOUR BEST to sleep in the bed at night. DO NOT sleep in your recliner. Long hours of sitting in a recliner leads to swelling of the legs and/or potential wounds on your backside. Off-Loading: Foot Defender  - right foot-keep shin covered-keep pressure off of the wound (Patient is not wear boot to clinic each week. Wearing tennis shoe) Additional Orders / Instructions: Follow Nutritious Diet and Increase Protein Intake - monitor blood sugar to maintain normal limits Other: - Follow up post surgery for left foot-[facility] handling Medications-Please add to medication list.: P.O. Antibiotics - Continue keystone and oral antibiotics WOUND #1: - Foot Wound Laterality: Plantar, Right Cleanser: Wound Cleanser Every Other Day/30 Days Discharge Instructions: Wash your hands with soap and water. Remove old dressing, discard into plastic bag and place into trash. Cleanse the wound with Wound Cleanser prior to applying a clean dressing using gauze sponges, not tissues or cotton balls. Do not scrub or use excessive force. Pat dry using gauze sponges, not tissue or cotton balls. Topical: keystone Every Other Day/30 Days Discharge Instructions: Patient did not bring to clinic. Will do at home later tonight. Primary Dressing: Hydrofera Blue Ready Transfer Foam, 2.5x2.5 (in/in) Every Other Day/30 Days Discharge Instructions: Apply Hydrofera Blue Ready to wound bed as directed Secondary Dressing: Zetuvit Plus 4x4 (in/in) (Generic) Every Other Day/30 Days Secured With: Coban  Cohesive Bandage 4x5 (yds) Stretched Every Other Day/30 Days Discharge Instructions: Apply Coban as directed. 1. Hydrofera  Blue and Keystone topical antibiotics 2. MRI with and without contrast 3. Follow-up in 1 week 4. Finish oral antibiotics. DONOVON, MICHELETTI ([id number]) Electronic Signature(s) Signed: 06/29/2022 9:13:00 AM By: Geralyn Corwin DO Entered By: Geralyn Corwin on 06/29/2022 09:11:05 [name] ([id number]) -------------------------------------------------------------------------------- SuperBill Details Patient Name: [name] Date of Service: 06/29/2022 Medical Record Number: [id number] Patient Account Number: [id number] Date of Birth/Sex: July 19, 1956 (65 y.o. Male) Treating RN: Huel Coventry Primary Care Provider: Manson Allan Other Clinician: Betha Loa Referring Provider: Manson Allan Treating Provider/Extender: Tilda Franco in Treatment: 33 Diagnosis Coding ICD-10 Codes Code Description [phone] 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ed E11.621 Type 2 diabetes mellitus with foot ulcer E11.40 Type 2 diabetes mellitus with diabetic neuropathy, unspecified R26.89 Other abnormalities of gait and mobility Facility Procedures CPT4 Code: [id number] Description: 99213 - WOUND CARE VISIT-LEV 3 EST PT Modifier: Quantity: 1 Physician Procedures CPT4 Code: [id number] Description: 99214 - WC PHYS LEVEL 4 - EST PT Modifier: Quantity: 1 CPT4 Code: Description: ICD-10 Diagnosis Description L97.512 Non-pressure chronic ulcer of other part of right foot with fat layer e E11.621 Type 2 diabetes mellitus with foot ulcer E11.40 Type 2 diabetes mellitus with diabetic neuropathy, unspecified R26.89 Other  abnormalities of gait and mobility Modifier: xposed Quantity: Electronic Signature(s) Signed: 06/29/2022 9:13:00 AM By: Geralyn Corwin DO Entered By: Geralyn Corwin on 06/29/2022 09:11:22

## 2022-07-01 ENCOUNTER — Ambulatory Visit
Admission: RE | Admit: 2022-07-01 | Discharge: 2022-07-01 | Disposition: A | Discharge status: Home / Self Care | Payer: Medicare Other | Source: Ambulatory Visit | Attending: Internal Medicine | Admitting: Internal Medicine

## 2022-07-01 DIAGNOSIS — L97512 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ed: Secondary | ICD-10-CM | POA: Diagnosis not present

## 2022-07-01 MED ORDER — GADOBUTROL 1 MMOL/ML IV SOLN
10.0000 mL | Freq: Once | INTRAVENOUS | Status: AC | PRN
  Administered 2022-07-01: 10 mL via INTRAVENOUS

## 2022-07-01 NOTE — Progress Notes (Signed)
MUKHTAR, SHAMS ([id number]) Visit Report for 06/29/2022 Arrival Information Details Patient Name: [name], [name] Date of Service: 06/29/2022 8:15 AM Medical Record Number: [id number] Patient Account Number: [id number] Date of Birth/Sex: Mar 21, 1956 (66 y.o. Male) Treating RN: Cornell Barman Primary Care Reia Viernes: [name] Other Clinician: Massie Kluver Referring Maricela Kawahara: [name] Treating Brendt Dible/Extender: Yaakov Guthrie in Treatment: 24 Visit Information History Since Last Visit All ordered tests and consults were completed: No Patient Arrived: Ambulatory Added or deleted any medications: No Arrival Time: 08:04 Any new allergies or adverse reactions: No Transfer Assistance: None Had a fall or experienced change in No Patient Requires Transmission-Based No activities of daily living that may affect Precautions: risk of falls: Patient Has Alerts: Yes Hospitalized since last visit: No Patient Alerts: Patient on Blood Pain Present Now: Yes Thinner DIABETIC Plavix Electronic Signature(s) Signed: 06/30/2022 4:44:47 PM By: Massie Kluver Entered By: Massie Kluver on 06/29/2022 08:09:16 [name] ([id number]) -------------------------------------------------------------------------------- Clinic Level of Care Assessment Details Patient Name: [name] Date of Service: 06/29/2022 8:15 AM Medical Record Number: [id number] Patient Account Number: [id number] Date of Birth/Sex: [birth year] (65 y.o. Male) Treating RN: Cornell Barman Primary Care Mystique Bjelland: [name] Other Clinician: Massie Kluver Referring Raechal Raben: [name] Treating Alyza Artiaga/Extender: Yaakov Guthrie in Treatment: 65 Clinic Level of Care Assessment Items TOOL 4 Quantity Score []  - Use when only an EandM is performed on FOLLOW-UP visit 0 ASSESSMENTS - Nursing Assessment / Reassessment X - Reassessment of Co-morbidities (includes updates in patient status) 1 10 X- 1  5 Reassessment of Adherence to Treatment Plan ASSESSMENTS - Wound and Skin Assessment / Reassessment X - Simple Wound Assessment / Reassessment - one wound 1 5 []  - 0 Complex Wound Assessment / Reassessment - multiple wounds []  - 0 Dermatologic / Skin Assessment (not related to wound area) ASSESSMENTS - Focused Assessment []  - Circumferential Edema Measurements - multi extremities 0 []  - 0 Nutritional Assessment / Counseling / Intervention []  - 0 Lower Extremity Assessment (monofilament, tuning fork, pulses) []  - 0 Peripheral Arterial Disease Assessment (using hand held doppler) ASSESSMENTS - Ostomy and/or Continence Assessment and Care []  - Incontinence Assessment and Management 0 []  - 0 Ostomy Care Assessment and Management (repouching, etc.) PROCESS - Coordination of Care X - Simple Patient / Family Education for ongoing care 1 15 []  - 0 Complex (extensive) Patient / Family Education for ongoing care []  - 0 Staff obtains Programmer, systems, Records, Test Results / Process Orders []  - 0 Staff telephones HHA, Nursing Homes / Clarify orders / etc []  - 0 Routine Transfer to another Facility (non-emergent condition) []  - 0 Routine Hospital Admission (non-emergent condition) []  - 0 New Admissions / Biomedical engineer / Ordering NPWT, Apligraf, etc. []  - 0 Emergency Hospital Admission (emergent condition) X- 1 10 Simple Discharge Coordination []  - 0 Complex (extensive) Discharge Coordination PROCESS - Special Needs []  - Pediatric / Minor Patient Management 0 []  - 0 Isolation Patient Management []  - 0 Hearing / Language / Visual special needs []  - 0 Assessment of Community assistance (transportation, D/C planning, etc.) []  - 0 Additional assistance / Altered mentation []  - 0 Support Surface(s) Assessment (bed, cushion, seat, etc.) INTERVENTIONS - Wound Cleansing / Measurement Jacot, Nhan ([id number]) X- 1 5 Simple Wound Cleansing - one wound []  - 0 Complex Wound  Cleansing - multiple wounds X- 1 5 Wound Imaging (photographs - any number of wounds) []  - 0 Wound Tracing (instead of photographs) X- 1 5 Simple Wound Measurement - one wound []  - 0  Complex Wound Measurement - multiple wounds INTERVENTIONS - Wound Dressings []  - Small Wound Dressing one or multiple wounds 0 X- 1 15 Medium Wound Dressing one or multiple wounds []  - 0 Large Wound Dressing one or multiple wounds []  - 0 Application of Medications - topical []  - 0 Application of Medications - injection INTERVENTIONS - Miscellaneous []  - External ear exam 0 []  - 0 Specimen Collection (cultures, biopsies, blood, body fluids, etc.) []  - 0 Specimen(s) / Culture(s) sent or taken to Lab for analysis []  - 0 Patient Transfer (multiple staff / Civil Service fast streamer / Similar devices) []  - 0 Simple Staple / Suture removal (25 or less) []  - 0 Complex Staple / Suture removal (26 or more) []  - 0 Hypo / Hyperglycemic Management (close monitor of Blood Glucose) []  - 0 Ankle / Brachial Index (ABI) - do not check if billed separately X- 1 5 Vital Signs Has the patient been seen at the hospital within the last three years: Yes Total Score: 80 Level Of Care: New/Established - Level 3 Electronic Signature(s) Signed: 06/30/2022 4:44:47 PM By: Massie Kluver Entered By: Massie Kluver on 06/29/2022 08:47:24 Grandmaison, [name] ([id number]) -------------------------------------------------------------------------------- Encounter Discharge Information Details Patient Name: [name] Date of Service: 06/29/2022 8:15 AM Medical Record Number: [id number] Patient Account Number: [id number] Date of Birth/Sex: December 16, 1955 (66 y.o. Male) Treating RN: Cornell Barman Primary Care Adelaide Pfefferkorn: [name] Other Clinician: Massie Kluver Referring Ashlin Hidalgo: [name] Treating Auriel Kist/Extender: Yaakov Guthrie in Treatment: 21 Encounter Discharge Information Items Discharge Condition: Stable Ambulatory  Status: Ambulatory Discharge Destination: Home Transportation: Private Auto Accompanied By: self Schedule Follow-up Appointment: Yes Clinical Summary of Care: Electronic Signature(s) Signed: 06/30/2022 4:44:47 PM By: Massie Kluver Entered By: Massie Kluver on 06/29/2022 09:56:44 [name] ([id number]) -------------------------------------------------------------------------------- Lower Extremity Assessment Details Patient Name: [name] Date of Service: 06/29/2022 8:15 AM Medical Record Number: [id number] Patient Account Number: [id number] Date of Birth/Sex: 1956/05/20 (66 y.o. Male) Treating RN: Cornell Barman Primary Care Khaliel Morey: [name] Other Clinician: Massie Kluver Referring Thomasa Heidler: [name] Treating Kodee Ravert/Extender: Yaakov Guthrie in Treatment: 23 Electronic Signature(s) Signed: 06/30/2022 4:44:47 PM By: Massie Kluver Signed: 06/30/2022 6:20:35 PM By: Gretta Cool, BSN, RN, CWS, Kim RN, BSN Entered By: Massie Kluver on 06/29/2022 08:20:17 [name] ([id number]) -------------------------------------------------------------------------------- Multi Wound Chart Details Patient Name: [name] Date of Service: 06/29/2022 8:15 AM Medical Record Number: [id number] Patient Account Number: [id number] Date of Birth/Sex: August 07, 1956 (65 y.o. Male) Treating RN: Cornell Barman Primary Care Gracelin Weisberg: [name] Other Clinician: Massie Kluver Referring Anais Koenen: [name] Treating Amina Menchaca/Extender: Yaakov Guthrie in Treatment: 33 Vital Signs Height(in): 74 Pulse(bpm): 102 Weight(lbs): 226 Blood Pressure(mmHg): 122/78 Body Mass Index(BMI): 29 Temperature(F): 98.1 Respiratory Rate(breaths/min): 18 Photos: [N/A:N/A] Wound Location: Right, Plantar Foot N/A N/A Wounding Event: Blister N/A N/A Primary Etiology: Diabetic Wound/Ulcer of the Lower N/A N/A Extremity Comorbid History: Type II Diabetes, Osteoarthritis N/A N/A Date  Acquired: 07/15/2020 N/A N/A Weeks of Treatment: 33 N/A N/A Wound Status: Open N/A N/A Wound Recurrence: No N/A N/A Measurements L x W x D (cm) 1.2x0.6x0.3 N/A N/A Area (cm) : 0.565 N/A N/A Volume (cm) : 0.17 N/A N/A % Reduction in Area: 49.70% N/A N/A % Reduction in Volume: 74.80% N/A N/A Classification: Grade 2 N/A N/A Exudate Amount: Medium N/A N/A Exudate Type: Serosanguineous N/A N/A Exudate Color: red, brown N/A N/A Wound Margin: Thickened N/A N/A Granulation Amount: Large (67-100%) N/A N/A Granulation Quality: Red N/A N/A Necrotic Amount: Small (1-33%) N/A N/A Exposed Structures: Fat Layer (Subcutaneous  Tissue): N/A N/A Yes Fascia: No Tendon: No Muscle: No Joint: No Bone: No Epithelialization: None N/A N/A Treatment Notes Electronic Signature(s) Signed: 06/30/2022 4:44:47 PM By: Massie Kluver Entered By: Massie Kluver on 06/29/2022 08:20:53 [name] ([id number]) -------------------------------------------------------------------------------- Multi-Disciplinary Care Plan Details Patient Name: [name] Date of Service: 06/29/2022 8:15 AM Medical Record Number: [id number] Patient Account Number: [id number] Date of Birth/Sex: 09/04/1956 (66 y.o. Male) Treating RN: Cornell Barman Primary Care Sanii Kukla: [name] Other Clinician: Massie Kluver Referring Elisa Sorlie: [name] Treating Kamrin Sibley/Extender: Yaakov Guthrie in Treatment: 79 Active Inactive Wound/Skin Impairment Nursing Diagnoses: Impaired tissue integrity Knowledge deficit related to smoking impact on wound healing Knowledge deficit related to ulceration/compromised skin integrity Goals: Patient/caregiver will verbalize understanding of skin care regimen Date Initiated: 11/10/2021 Date Inactivated: 12/01/2021 Target Resolution Date: 11/17/2021 Goal Status: Met Ulcer/skin breakdown will have a volume reduction of 30% by week 4 Date Initiated: 11/10/2021 Date Inactivated:  02/02/2022 Target Resolution Date: 12/08/2021 Goal Status: Met Ulcer/skin breakdown will have a volume reduction of 50% by week 8 Date Initiated: 11/10/2021 Target Resolution Date: 01/05/2022 Goal Status: Active Ulcer/skin breakdown will have a volume reduction of 80% by week 12 Date Initiated: 11/10/2021 Target Resolution Date: 02/02/2022 Goal Status: Active Ulcer/skin breakdown will heal within 14 weeks Date Initiated: 11/10/2021 Target Resolution Date: 02/16/2022 Goal Status: Active Interventions: Assess patient/caregiver ability to obtain necessary supplies Assess patient/caregiver ability to perform ulcer/skin care regimen upon admission and as needed Assess ulceration(s) every visit Notes: Electronic Signature(s) Signed: 06/30/2022 4:44:47 PM By: Massie Kluver Signed: 06/30/2022 6:20:35 PM By: Gretta Cool, BSN, RN, CWS, Kim RN, BSN Entered By: Massie Kluver on 06/29/2022 08:20:26 [name] ([id number]) -------------------------------------------------------------------------------- Pain Assessment Details Patient Name: [name] Date of Service: 06/29/2022 8:15 AM Medical Record Number: [id number] Patient Account Number: [id number] Date of Birth/Sex: 1955-12-22 (66 y.o. Male) Treating RN: Cornell Barman Primary Care Sinan Tuch: [name] Other Clinician: Massie Kluver Referring Likisha Alles: [name] Treating Radhika Dershem/Extender: Yaakov Guthrie in Treatment: 33 Active Problems Location of Pain Severity and Description of Pain Patient Has Paino Yes Site Locations Pain Location: Pain in Ulcers Duration of the Pain. Constant / Intermittento Constant Rate the pain. Current Pain Level: 6 Character of Pain Describe the Pain: Throbbing Pain Management and Medication Current Pain Management: Medication: Yes Rest: Yes Notes pain is worse when walking Electronic Signature(s) Signed: 06/30/2022 4:44:47 PM By: Massie Kluver Signed: 06/30/2022 6:20:35 PM By: Gretta Cool, BSN,  RN, CWS, Kim RN, BSN Entered By: Massie Kluver on 06/29/2022 08:13:17 [name] ([id number]) -------------------------------------------------------------------------------- Patient/Caregiver Education Details Patient Name: [name] Date of Service: 06/29/2022 8:15 AM Medical Record Number: [id number] Patient Account Number: [id number] Date of Birth/Gender: July 25, 1956 (66 y.o. Male) Treating RN: Cornell Barman Primary Care Physician: [name] Other Clinician: Massie Kluver Referring Physician: [name] Treating Physician/Extender: Yaakov Guthrie in Treatment: 42 Education Assessment Education Provided To: Patient Education Topics Provided Infection: Handouts: Other: continue antibiotics as directed Methods: Explain/Verbal Responses: State content correctly Wound/Skin Impairment: Handouts: Other: continue wound care as directed Methods: Explain/Verbal Responses: State content correctly Electronic Signature(s) Signed: 06/30/2022 4:44:47 PM By: Massie Kluver Entered By: Massie Kluver on 06/29/2022 08:48:18 [name] ([id number]) -------------------------------------------------------------------------------- Wound Assessment Details Patient Name: [name] Date of Service: 06/29/2022 8:15 AM Medical Record Number: [id number] Patient Account Number: [id number] Date of Birth/Sex: September 15, 1956 (65 y.o. Male) Treating RN: Cornell Barman Primary Care Kaytee Taliercio: [name] Other Clinician: Massie Kluver Referring Blondine Hottel: [name] Treating Dorin Stooksbury/Extender: Yaakov Guthrie in Treatment: 33 Wound Status Wound Number: 1  Primary Etiology: Diabetic Wound/Ulcer of the Lower Extremity Wound Location: Right, Plantar Foot Wound Status: Open Wounding Event: Blister Comorbid History: Type II Diabetes, Osteoarthritis Date Acquired: 07/15/2020 Weeks Of Treatment: 33 Clustered Wound: No Photos Wound Measurements Length: (cm) 1.2 Width:  (cm) 0.6 Depth: (cm) 0.3 Area: (cm) 0.565 Volume: (cm) 0.17 % Reduction in Area: 49.7% % Reduction in Volume: 74.8% Epithelialization: None Tunneling: No Undermining: No Wound Description Classification: Grade 2 Wound Margin: Thickened Exudate Amount: Medium Exudate Type: Serosanguineous Exudate Color: red, brown Foul Odor After Cleansing: No Slough/Fibrino Yes Wound Bed Granulation Amount: Large (67-100%) Exposed Structure Granulation Quality: Red Fascia Exposed: No Necrotic Amount: Small (1-33%) Fat Layer (Subcutaneous Tissue) Exposed: Yes Necrotic Quality: Adherent Slough Tendon Exposed: No Muscle Exposed: No Joint Exposed: No Bone Exposed: No Treatment Notes Wound #1 (Foot) Wound Laterality: Plantar, Right Cleanser Wound Cleanser Discharge Instruction: Wash your hands with soap and water. Remove old dressing, discard into plastic bag and place into trash. Cleanse the wound with Wound Cleanser prior to applying a clean dressing using gauze sponges, not tissues or cotton balls. Do not scrub or use excessive force. Pat dry using gauze sponges, not tissue or cotton balls. [name], [name] ([id number]) Peri-Wound Care Topical keystone Discharge Instruction: Patient did not bring to clinic. Will do at home later tonight. Primary Dressing Hydrofera Blue Ready Transfer Foam, 2.5x2.5 (in/in) Discharge Instruction: Apply Hydrofera Blue Ready to wound bed as directed Secondary Dressing Zetuvit Plus 4x4 (in/in) Secured With Coban Cohesive Bandage 4x5 (yds) Stretched Discharge Instruction: Apply Coban as directed. Compression Wrap Compression Stockings Add-Ons Electronic Signature(s) Signed: 06/30/2022 4:44:47 PM By: Massie Kluver Signed: 06/30/2022 6:20:35 PM By: Gretta Cool, BSN, RN, CWS, Kim RN, BSN Entered By: Massie Kluver on 06/29/2022 08:20:04 [name] ([id number]) -------------------------------------------------------------------------------- Vitals  Details Patient Name: [name] Date of Service: 06/29/2022 8:15 AM Medical Record Number: [id number] Patient Account Number: [id number] Date of Birth/Sex: [birth year] (66 y.o. Male) Treating RN: Cornell Barman Primary Care Brendolyn Stockley: [name] Other Clinician: Massie Kluver Referring Irish Piech: [name] Treating Criss Bartles/Extender: Yaakov Guthrie in Treatment: 75 Vital Signs Time Taken: 08:12 Temperature (F): 98.1 Height (in): 74 Pulse (bpm): 102 Weight (lbs): 226 Respiratory Rate (breaths/min): 18 Body Mass Index (BMI): 29 Blood Pressure (mmHg): 122/78 Reference Range: 80 - 120 mg / dl Electronic Signature(s) Signed: 06/30/2022 4:44:47 PM By: Massie Kluver Entered By: Massie Kluver on 06/29/2022 08:13:03

## 2022-07-06 ENCOUNTER — Encounter: Payer: Medicare Other | Attending: Internal Medicine | Admitting: Internal Medicine

## 2022-07-06 DIAGNOSIS — E11621 Type 2 diabetes mellitus with foot ulcer: Secondary | ICD-10-CM | POA: Insufficient documentation

## 2022-07-06 DIAGNOSIS — X58XXXA Exposure to other specified factors, initial encounter: Secondary | ICD-10-CM | POA: Insufficient documentation

## 2022-07-06 DIAGNOSIS — R2689 Other abnormalities of gait and mobility: Secondary | ICD-10-CM | POA: Diagnosis not present

## 2022-07-06 DIAGNOSIS — E1142 Type 2 diabetes mellitus with diabetic polyneuropathy: Secondary | ICD-10-CM | POA: Insufficient documentation

## 2022-07-06 DIAGNOSIS — M1990 Unspecified osteoarthritis, unspecified site: Secondary | ICD-10-CM | POA: Insufficient documentation

## 2022-07-06 DIAGNOSIS — L97512 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ed: Secondary | ICD-10-CM | POA: Insufficient documentation

## 2022-07-06 DIAGNOSIS — S91302A Unspecified open wound, left foot, initial encounter: Secondary | ICD-10-CM | POA: Diagnosis not present

## 2022-07-07 NOTE — Progress Notes (Signed)
[name], [name] ([id number]) Visit Report for 07/06/2022 Chief Complaint Document Details Patient Name: [name], [name] Date of Service: 07/06/2022 8:15 AM Medical Record Number: [id number] Patient Account Number: [id number] Date of Birth/Sex: [birth year] (66 y.o. M) Treating RN: Huel Coventry Primary Care Provider: Manson Allan Other Clinician: Betha Loa Referring Provider: Manson Allan Treating Provider/Extender: Tilda Franco in Treatment: 38 Information Obtained from: Patient Chief Complaint Right plantar foot wound Electronic Signature(s) Signed: 07/06/2022 9:07:33 AM By: Geralyn Corwin DO Entered By: Geralyn Corwin on 07/06/2022 09:01:07 [name] ([id number]) -------------------------------------------------------------------------------- Debridement Details Patient Name: [name] Date of Service: 07/06/2022 8:15 AM Medical Record Number: [id number] Patient Account Number: [id number] Date of Birth/Sex: May 03, 1956 (66 y.o. M) Treating RN: Huel Coventry Primary Care Provider: Manson Allan Other Clinician: Betha Loa Referring Provider: Manson Allan Treating Provider/Extender: Tilda Franco in Treatment: 34 Debridement Performed for Wound #1 Right,Plantar Foot Assessment: Performed By: Physician Geralyn Corwin, MD Debridement Type: Debridement Severity of Tissue Pre Debridement: Fat layer exposed Level of Consciousness (Pre- Awake and Alert procedure): Pre-procedure Verification/Time Out Yes - 08:39 Taken: Start Time: 08:39 Total Area Debrided (L x W): 2 (cm) x 1.3 (cm) = 2.6 (cm) Tissue and other material Viable, Non-Viable, Callus, Slough, Subcutaneous, Slough debrided: Level: Skin/Subcutaneous Tissue Debridement Description: Excisional Instrument: Curette Bleeding: Minimum Hemostasis Achieved: Pressure End Time: 08:45 Response to Treatment: Procedure was tolerated well Level of Consciousness (Post- Awake and  Alert procedure): Post Debridement Measurements of Total Wound Length: (cm) 1.5 Width: (cm) 0.9 Depth: (cm) 0.4 Volume: (cm) 0.424 Character of Wound/Ulcer Post Debridement: Stable Severity of Tissue Post Debridement: Fat layer exposed Post Procedure Diagnosis Same as Pre-procedure Electronic Signature(s) Signed: 07/06/2022 9:07:33 AM By: Geralyn Corwin DO Signed: 07/06/2022 5:12:58 PM By: Betha Loa Signed: 07/07/2022 9:40:40 AM By: Elliot Gurney, BSN, RN, CWS, Kim RN, BSN Entered By: Betha Loa on 07/06/2022 08:46:07 [name] ([id number]) -------------------------------------------------------------------------------- HPI Details Patient Name: [name] Date of Service: 07/06/2022 8:15 AM Medical Record Number: [id number] Patient Account Number: [id number] Date of Birth/Sex: October 26, 1956 (66 y.o. M) Treating RN: Huel Coventry Primary Care Provider: Manson Allan Other Clinician: Betha Loa Referring Provider: Manson Allan Treating Provider/Extender: Tilda Franco in Treatment: 37 History of Present Illness HPI Description: Admission 11/10/2021 [name] is a 66 year old male with a past medical history of uncontrolled type 2 diabetes with last hemoglobin A1c of 8.1 complicated by peripheral neuropathy that presents to the clinic for a 2-year history of nonhealing ulcer to the bottom of his right foot. He states this started out as a blister caused by a work boot. He reports receiving wound care when he resided in [place]. He is reestablishing his wound care in [facility] today. He is currently keeping the area clean and covered. He has insoles designed to help offload the wound bed. He currently denies signs of infection. 1/18; patient presents for follow-up. He has been using Hydrofera Blue to the wound bed. He has no issues or complaints today. He has not received the defender.[name] He denies signs of infection. 2/1; patient presents for  follow-up. He has been using Hydrofera Blue to the wound bed he states he received the defender boot and has been using it however he did not have it on today. He currently denies signs of infection to the right foot. Unfortunately he developed a wound to the left great toe over the past week. He states he received new orthotics and these caused a blister to the left great toe which turned into a  wound. He has not been doing anything to the wound bed. He currently denies systemic signs of infection. 2/8; Patient presents for follow-up. 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 He states he is about 90% recovered. He has some mild weakness to the left side. He reports taking the antibiotics prescribed at last clinic visit. He reports using Hydrofera Blue for dressing changes. He only uses the offloading boot when he is at home. He uses open toed shoes to the right foot. He currently denies signs of infection. 2/15; patient presents for follow-up. He states he is still taking the antibiotics prescribed. He reports using the defender boot to the right foot and open toed shoe to the left foot however he is not wearing either today. He currently denies systemic signs of infection. He stated he cut down a tree last week and was outside doing yard work. 2/22; patient presents for follow-up. 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 He reports feeling well. He reports using the defender boot to the right leg and continues to use Hydrofera Blue with dressing changes. He denies systemic signs of infection. 3/8; patient presents for follow-up. He sees Dr. Alberteen Spindleline tomorrow for follow-up of the left great toe amputation. This was a partial amputation. He reports he is taking Augmentin currently. He reports increased erythema to the left great toe amputation site. He also  reports a decline in his right plantar foot wound. He reports it is draining more. He denies purulent drainage. 3/15; patient presents for follow-up. He did not see Dr. Alberteen Spindleline last week but states he sees him today. He states he has been taking doxycycline in the past week and started gentamicin ointment. He [facility] with dressing changes. He currently denies systemic signs of infection. 3/22; patient presents for follow-up. Patient had partial amputation of the left great toe on 2/15. 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 Alberteen Spindleline. He reports no issues and has no complaints. He is currently taking Keflex and doxycycline prescribed by Dr. Alberteen Spindleline. He continues to use gentamicin ointment and Hydrofera Blue to the right foot wound. He reports using the defender boot when he is at home. 3/29; patient presents for follow-up. He continues to be on antibiotics per podiatry. He has been using Hydrofera Blue and gentamicin ointment to the right plantar foot wound. He has no issues or complaints today. He denies signs of infection. 4/5; patient presents for follow-up. He has been using Dakin's wet-to-dry dressings with improvement to wound healing. He denies signs of infection. She has no issues or complaints today. 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 Use Hydrofera Blue for a prolonged period of time and has been using Dakin's wet- to-dry I think for the last 2 to 3 weeks. He has a Psychologist, forensicdefender boot at home but he does not wear that coming into the clinic because he drives. He tells me he has been compliant with the defender boot short of when he goes out to drive. He lives alone. He also tells me he has had a previous left great toe amputation We are following him for the right foot wound. 4/26; patient presents for follow-up. He had worsening of his left foot wound and had surgical debridement on 4/16 by Dr. Fanny SkatesSutherland. He is  being  followed by podiatry for this issue. He he is on IV Unasyn based on tissue culture by ID.[name] We are following him for the right foot wound. He has been using Dakin's wet-to-dry dressings without issues. He reports continued usage of the defender boot. 5/3; patient presents for follow-up. He has been using Dakin's wet-to-dry dressings to the right foot wound. He has no issues or complaints today. He reports using the Psychologist, forensic. He reports offloading the foot wound when he is at home by sitting in a recliner. He denies signs of infection. He is still getting IV antibiotics For the left foot wound that is being followed by podiatry. He states the end date is later this month on 5/28. 5/17; patient presents for follow-up. He missed his last clinic appointment. He has been using Dakin's wet-to-dry dressings. He states he is offloading the right foot wound with the defender boot although he does not have this today. He is still on IV antibiotics. He has no issues or complaints today. 5/24; patient presents for follow-up. He has been using Hydrofera Blue to the wound bed without issues. He denies signs of infection. At last clinic [facility], [place] ([id number]) visit he had a wound culture done that detected high levels of Enterococcus faecalis and low levels of Corynebacterium stratum and Staphylococcus epidermidis. Keystone antibiotics were ordered. He has not heard from the company yet. 5/31; patient presents for follow-up. He continues to use Hydrofera Blue to the wound bed. He denies signs of infection. Keystone antibiotics were ordered at last clinic visit and he states he called the company to pay for it. He states that it is being shipped. We rediscussed the total contact cast and he declines having this placed today. He states he is wearing the defender boot however he never wears it into the office. 04-06-2022 upon evaluation today patient appears to be doing about the same in regard to his  wound. This is not significantly smaller. He is not wearing his offloading boot. We discussed that today he tells me that he wears it "all the time as he chuckled and does not have it on during the office visit today. I think it is increasingly obvious that he is just messing around when he says this based on what I am seeing and otherwise I do not know why he would wear it all the time but not to the clinic. Either way my opinion based on what I am seeing currently is simply that the wound does seem to be still having quite a bit of callus buildup around the edges it is also very dry. He does tell me that he has been using the [facility] since he got it and to be honest I think this is good and should hopefully help keep things under control but at the same time I do not see any evidence of active infection. Again the Jodie Echevaria is a topical antibiotic with multiple compounds to help prevent further infection and worsening overall. 6/14; patient presents for follow-up. He has been using Keystone antibiotics with [employer]	[place] daily. He states he is wearing his offloading boot. He does not have this in office today. He is going to the beach for the next 5 days. He states he Will not be using using any offloading device During this time. I again offered the total contact cast however he declined. I did ask him to reconsider for next clinic visit. He currently denies signs of infection. 6/21; patient presents for follow-up. He went  to the beach last week. He did not use an offloading device. Wound is slightly deeper today. I again offered the total contact cast but he declined. Currently denies signs of infection. He reports using the [place], hydrofera blue and the Psychologist, forensic. 6/28; patient presents for follow-up. Patient declines total contact cast today. He has been using Keystone and [facility] to the wound bed. It is unclear if he is using the Psychologist, forensic. He would like a letter for the  post man to delivers mail to his front door so he does not have to walk down the driveway. 7/5; patient presents for follow-up. He states he is considering the total contact cast however does not want this placed today. He is actually going to the gym after this appointment. He does not want offloading foot wear for this. He has been using Keystone and [facility] to the wound bed. He denies signs of infection. 7/12; patient presents for follow-up. He is still considering the total contact cast however does not want this today. He has been using Keystone antibiotic and Hydrofera Blue to the wound bed. He denies signs of infection. He has no issues or complaints today. 7/26; patient presents for follow-up. He declines a total contact cast. He has been using his an antibiotic and Hydrofera Blue to the wound bed. He denies systemic signs of infection. He is scheduled to discuss orthotics next week with his podiatrist. 8/2; patient presents for follow-up. He has been using Hydrofera Blue to the wound bed. He has no issues or complaints today. He declines a total contact cast. He states he is getting orthotics today. 8/9; patient presents for follow-up. He has been using Hydrofera Blue to the wound bed. He again declines the total contact cast. He does state that he is considering starting it at the end of the month. He has a vacation coming up and does not want to have it in place for that. 8/16; patient presents for follow-up. He has been using Hydrofera Blue and Keystone to the wound bed. He started taking Augmentin and doxycycline as prescribed. He denies signs of infection. He is going on his vacation next week and will be back in 2 weeks. He states he is considering the total contact cast for when he comes back from his trip. 8/30; patient present for follow-up. He has been using Hydrofera Blue and Keystone to the wound bed. He continues to take doxycycline and Augmentin without issues. He went  on vacation and reports walking for long periods of time without pressure relief to the wound bed. He currently denies systemic signs of infection. 9/6; patient presents for follow-up. He has been using Hydrofera Blue and Keystone to the wound bed. He is not wearing his offloading boot. He declines a total contact cast. He has finished Augmentin and doxycycline. He had an MRI completed on 07/01/2022 that did not show an underlying abscess, septic arthritis or osteomyelitis. He currently denies systemic signs of infection. Electronic Signature(s) Signed: 07/06/2022 9:07:33 AM By: Geralyn Corwin DO Entered By: Geralyn Corwin on 07/06/2022 09:03:43 [name] ([id number]) -------------------------------------------------------------------------------- Physical Exam Details Patient Name: [name] Date of Service: 07/06/2022 8:15 AM Medical Record Number: [id number] Patient Account Number: [id number] Date of Birth/Sex: [birth year] (66 y.o. M) Treating RN: Huel Coventry Primary Care Provider: Manson Allan Other Clinician: Betha Loa Referring Provider: Manson Allan Treating Provider/Extender: Tilda Franco in Treatment: 34 Constitutional . Cardiovascular . Psychiatric . Notes Right foot: To the plantar aspect of the  first metatarsal head there is an open wound with granulation tissue, Nonviable tissue and callus. There is significant undermining to the 7o11 o'clock position. This area probes close to bone. No increased warmth, erythema or purulent drainage noted. Electronic Signature(s) Signed: 07/06/2022 9:07:33 AM By: Geralyn Corwin DO Entered By: Geralyn Corwin on 07/06/2022 09:04:21 [name] ([id number]) -------------------------------------------------------------------------------- Physician Orders Details Patient Name: [name] Date of Service: 07/06/2022 8:15 AM Medical Record Number: [id number] Patient Account Number: [id number] Date of  Birth/Sex: 01/01/1956 (66 y.o. M) Treating RN: Huel Coventry Primary Care Provider: Manson Allan Other Clinician: Betha Loa Referring Provider: Manson Allan Treating Provider/Extender: Tilda Franco in Treatment: 83 Verbal / Phone Orders: No Diagnosis Coding Follow-up Appointments o Return Appointment in 1 week. o Nurse Visit as needed Bathing/ Shower/ Hygiene o May shower; gently cleanse wound with antibacterial soap, rinse and pat dry prior to dressing wounds o No tub bath. Anesthetic (Use 'Patient Medications' Section for Anesthetic Order Entry) o Lidocaine applied to wound bed Edema Control - Lymphedema / Segmental Compressive Device / Other o Elevate, Exercise Daily and Avoid Standing for Long Periods of Time. o Elevate leg(s) parallel to the floor when sitting. o DO YOUR BEST to sleep in the bed at night. DO NOT sleep in your recliner. Long hours of sitting in a recliner leads to swelling of the legs and/or potential wounds on your backside. Off-Loading o Foot Defender o - right foot-keep shin covered-keep pressure off of the wound (Patient is not wear boot to clinic each week. Wearing tennis shoe) Additional Orders / Instructions o Follow Nutritious Diet and Increase Protein Intake - monitor blood sugar to maintain normal limits o Other: - Follow up post surgery for left foot-[facility] handling Medications-Please add to medication list. o P.O. Antibiotics - Continue keystone and oral antibiotics Wound Treatment Wound #1 - Foot Wound Laterality: Plantar, Right Cleanser: Wound Cleanser Every Other Day/30 Days Discharge Instructions: Wash your hands with soap and water. Remove old dressing, discard into plastic bag and place into trash. Cleanse the wound with Wound Cleanser prior to applying a clean dressing using gauze sponges, not tissues or cotton balls. Do not scrub or use excessive force. Pat dry using gauze sponges, not tissue  or cotton balls. Topical: keystone Every Other Day/30 Days Discharge Instructions: Patient did not bring to clinic. Will do at home later tonight. Primary Dressing: Hydrofera Blue Ready Transfer Foam, 2.5x2.5 (in/in) Every Other Day/30 Days Discharge Instructions: Apply Hydrofera Blue Ready to wound bed as directed Secondary Dressing: Zetuvit Plus 4x4 (in/in) (Generic) Every Other Day/30 Days Secured With: Coban Cohesive Bandage 4x5 (yds) Stretched Every Other Day/30 Days Discharge Instructions: Apply Coban as directed. Electronic Signature(s) Signed: 07/06/2022 9:07:33 AM By: Geralyn Corwin DO Entered By: Geralyn Corwin on 07/06/2022 09:06:53 [name] ([id number]) -------------------------------------------------------------------------------- Problem List Details Patient Name: [name] Date of Service: 07/06/2022 8:15 AM Medical Record Number: [id number] Patient Account Number: [id number] Date of Birth/Sex: [birth year] (66 y.o. M) Treating RN: Huel Coventry Primary Care Provider: Manson Allan Other Clinician: Betha Loa Referring Provider: Manson Allan Treating Provider/Extender: Tilda Franco in Treatment: 8 Active Problems ICD-10 Encounter Code Description Active Date MDM Diagnosis L97.512 Non-pressure chronic ulcer of other part of right foot with fat layer 11/10/2021 No Yes exposed E11.621 Type 2 diabetes mellitus with foot ulcer 11/10/2021 No Yes E11.40 Type 2 diabetes mellitus with diabetic neuropathy, unspecified 11/10/2021 No Yes R26.89 Other abnormalities of gait and mobility 11/10/2021 No Yes Inactive Problems ICD-10 Code Description  Active Date Inactive Date L97.528 Non-pressure chronic ulcer of other part of left foot with other specified 12/01/2021 12/01/2021 severity Resolved Problems Electronic Signature(s) Signed: 07/06/2022 9:07:33 AM By: Geralyn Corwin DO Entered By: Geralyn Corwin on 07/06/2022 09:01:03 [name]  ([id number]) -------------------------------------------------------------------------------- Progress Note Details Patient Name: [name] Date of Service: 07/06/2022 8:15 AM Medical Record Number: [id number] Patient Account Number: [id number] Date of Birth/Sex: April 19, 1956 (66 y.o. M) Treating RN: Huel Coventry Primary Care Provider: Manson Allan Other Clinician: Betha Loa Referring Provider: Manson Allan Treating Provider/Extender: Tilda Franco in Treatment: 14 Subjective Chief Complaint Information obtained from Patient Right plantar foot wound History of Present Illness (HPI) Admission 11/10/2021 Mr. Berel Najjar is a 66 year old male with a past medical history of uncontrolled type 2 diabetes with last hemoglobin A1c of 8.1 complicated by peripheral neuropathy that presents to the clinic for a 2-year history of nonhealing ulcer to the bottom of his right foot. He states this started out as a blister caused by a work boot. He reports receiving wound care when he resided in [place]. He is reestablishing his wound care in [facility] today. He is currently keeping the area clean and covered. He has insoles designed to help offload the wound bed. He currently denies signs of infection. 1/18; patient presents for follow-up. He has been using Hydrofera Blue to the wound bed. He has no issues or complaints today. He has not received the defender.[name] He denies signs of infection. 2/1; patient presents for follow-up. He has been using Hydrofera Blue to the wound bed he states he received the defender boot and has been using it however he did not have it on today. He currently denies signs of infection to the right foot. Unfortunately he developed a wound to the left great toe over the past week. He states he received new orthotics and these caused a blister to the left great toe which turned into a wound. He has not been doing anything to the wound bed. He  currently denies systemic signs of infection. 2/8; Patient presents for follow-up. 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 He states he is about 90% recovered. He has some mild weakness to the left side. He reports taking the antibiotics prescribed at last clinic visit. He reports using Hydrofera Blue for dressing changes. He only uses the offloading boot when he is at home. He uses open toed shoes to the right foot. He currently denies signs of infection. 2/15; patient presents for follow-up. He states he is still taking the antibiotics prescribed. He reports using the defender boot to the right foot and open toed shoe to the left foot however he is not wearing either today. He currently denies systemic signs of infection. He stated he cut down a tree last week and was outside doing yard work. 2/22; patient presents for follow-up. 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 He reports feeling well. He reports using the defender boot to the right leg and continues to use Hydrofera Blue with dressing changes. He denies systemic signs of infection. 3/8; patient presents for follow-up. He sees Dr. Alberteen Spindle tomorrow for follow-up of the left great toe amputation. This was a partial amputation. He reports he is taking Augmentin currently. He reports increased erythema to the left great toe amputation site. He also reports a decline in his  right plantar foot wound. He reports it is draining more. He denies purulent drainage. 3/15; patient presents for follow-up. He did not see Dr. Alberteen Spindle last week but states he sees him today. He states he has been taking doxycycline in the past week and started gentamicin ointment. He continues to use [facility] with dressing changes. He currently denies systemic signs of infection. 3/22; patient presents for follow-up.  Patient had partial amputation of the left great toe on 2/15. 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 Alberteen Spindle. He reports no issues and has no complaints. He is currently taking Keflex and doxycycline prescribed by Dr. Alberteen Spindle. He continues to use gentamicin ointment and Hydrofera Blue to the right foot wound. He reports using the defender boot when he is at home. 3/29; patient presents for follow-up. He continues to be on antibiotics per podiatry. He has been using Hydrofera Blue and gentamicin ointment to the right plantar foot wound. He has no issues or complaints today. He denies signs of infection. 4/5; patient presents for follow-up. He has been using Dakin's wet-to-dry dressings with improvement to wound healing. He denies signs of infection. She has no issues or complaints today. 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 Use Hydrofera Blue for a prolonged period of time and has been using Dakin's wet- to-dry I think for the last 2 to 3 weeks. He has a Psychologist, forensic at home but he does not wear that coming into the clinic because he drives. He tells me he has been compliant with the defender boot short of when he goes out to drive. He lives alone. He also tells me he has had a previous left great toe amputation We are following him for the right foot wound. 4/26; patient presents for follow-up. He had worsening of his left foot wound and had surgical debridement on 4/16 by Dr. Fanny Skates. He is being followed by podiatry for this issue. He he is on IV Unasyn based on tissue culture by ID.[name] We are following him for the right foot wound. He has been using Dakin's wet-to-dry dressings without issues. He reports continued usage of the defender boot. 5/3; patient presents for follow-up. He has been using Dakin's wet-to-dry dressings to the right foot wound. He has no issues or complaints today. He reports using the Psychologist, forensic. He reports  offloading the foot wound when he is at home by sitting in a recliner. He denies signs of infection. He is still getting IV antibiotics For the left foot wound that is being followed by podiatry. He states the end date is later this month on 5/28. [name], [name] ([id number]) 5/17; patient presents for follow-up. He missed his last clinic appointment. He has been using Dakin's wet-to-dry dressings. He states he is offloading the right foot wound with the defender boot although he does not have this today. He is still on IV antibiotics. He has no issues or complaints today. 5/24; patient presents for follow-up. He has been using Hydrofera Blue to the wound bed without issues. He denies signs of infection. At last clinic visit he had a wound culture done that detected high levels of Enterococcus faecalis and low levels of Corynebacterium stratum and Staphylococcus epidermidis. Keystone antibiotics were ordered. He has not heard from the company yet. 5/31; patient presents for follow-up. He continues to use Hydrofera Blue to the wound bed. He denies signs of infection. Keystone antibiotics were ordered at last clinic visit and he  states he called the company to pay for it. He states that it is being shipped. We rediscussed the total contact cast and he declines having this placed today. He states he is wearing the defender boot however he never wears it into the office. 04-06-2022 upon evaluation today patient appears to be doing about the same in regard to his wound. This is not significantly smaller. He is not wearing his offloading boot. We discussed that today he tells me that he wears it "all the time as he chuckled and does not have it on during the office visit today. I think it is increasingly obvious that he is just messing around when he says this based on what I am seeing and otherwise I do not know why he would wear it all the time but not to the clinic. Either way my opinion based on what I am  seeing currently is simply that the wound does seem to be still having quite a bit of callus buildup around the edges it is also very dry. He does tell me that he has been using the [facility] since he got it and to be honest I think this is good and should hopefully help keep things under control but at the same time I do not see any evidence of active infection. Again the Jodie Echevaria is a topical antibiotic with multiple compounds to help prevent further infection and worsening overall. 6/14; patient presents for follow-up. He has been using Keystone antibiotics with [employer]	[place] daily. He states he is wearing his offloading boot. He does not have this in office today. He is going to the beach for the next 5 days. He states he Will not be using using any offloading device During this time. I again offered the total contact cast however he declined. I did ask him to reconsider for next clinic visit. He currently denies signs of infection. 6/21; patient presents for follow-up. He went to the beach last week. He did not use an offloading device. Wound is slightly deeper today. I again offered the total contact cast but he declined. Currently denies signs of infection. He reports using the [place], hydrofera blue and the Psychologist, forensic. 6/28; patient presents for follow-up. Patient declines total contact cast today. He has been using Keystone and [facility] to the wound bed. It is unclear if he is using the Psychologist, forensic. He would like a letter for the post man to delivers mail to his front door so he does not have to walk down the driveway. 7/5; patient presents for follow-up. He states he is considering the total contact cast however does not want this placed today. He is actually going to the gym after this appointment. He does not want offloading foot wear for this. He has been using Keystone and [facility] to the wound bed. He denies signs of infection. 7/12; patient presents for follow-up.  He is still considering the total contact cast however does not want this today. He has been using Keystone antibiotic and Hydrofera Blue to the wound bed. He denies signs of infection. He has no issues or complaints today. 7/26; patient presents for follow-up. He declines a total contact cast. He has been using his an antibiotic and Hydrofera Blue to the wound bed. He denies systemic signs of infection. He is scheduled to discuss orthotics next week with his podiatrist. 8/2; patient presents for follow-up. He has been using Hydrofera Blue to the wound bed. He has no issues or complaints  today. He declines a total contact cast. He states he is getting orthotics today. 8/9; patient presents for follow-up. He has been using Hydrofera Blue to the wound bed. He again declines the total contact cast. He does state that he is considering starting it at the end of the month. He has a vacation coming up and does not want to have it in place for that. 8/16; patient presents for follow-up. He has been using Hydrofera Blue and Keystone to the wound bed. He started taking Augmentin and doxycycline as prescribed. He denies signs of infection. He is going on his vacation next week and will be back in 2 weeks. He states he is considering the total contact cast for when he comes back from his trip. 8/30; patient present for follow-up. He has been using Hydrofera Blue and Keystone to the wound bed. He continues to take doxycycline and Augmentin without issues. He went on vacation and reports walking for long periods of time without pressure relief to the wound bed. He currently denies systemic signs of infection. 9/6; patient presents for follow-up. He has been using Hydrofera Blue and Keystone to the wound bed. He is not wearing his offloading boot. He declines a total contact cast. He has finished Augmentin and doxycycline. He had an MRI completed on 07/01/2022 that did not show an underlying abscess, septic  arthritis or osteomyelitis. He currently denies systemic signs of infection. Objective Constitutional Vitals Time Taken: 8:10 AM, Height: 74 in, Weight: 226 lbs, BMI: 29, Temperature: 98.0 F, Pulse: 74 bpm, Respiratory Rate: 16 breaths/min, Blood Pressure: 125/75 mmHg. [name], [name] ([id number]) General Notes: Right foot: To the plantar aspect of the first metatarsal head there is an open wound with granulation tissue, Nonviable tissue and callus. There is significant undermining to the 7 11 o'clock position. This area probes close to bone. No increased warmth, erythema or purulent drainage noted. Integumentary (Hair, Skin) Wound #1 status is Open. Original cause of wound was Blister. The date acquired was: 07/15/2020. The wound has been in treatment 34 weeks. The wound is located on the Right,Plantar Foot. The wound measures 1cm length x 0.7cm width x 0.3cm depth; 0.55cm^2 area and 0.165cm^3 volume. There is Fat Layer (Subcutaneous Tissue) exposed. There is no tunneling or undermining noted. There is a medium amount of serosanguineous drainage noted. The wound margin is thickened. There is large (67-100%) red granulation within the wound bed. There is a small (1-33%) amount of necrotic tissue within the wound bed including Adherent Slough. Assessment Active Problems ICD-10 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ed Type 2 diabetes mellitus with foot ulcer Type 2 diabetes mellitus with diabetic neuropathy, unspecified Other abnormalities of gait and mobility Patient's wound is stable. I debrided nonviable tissue. I recommended a total contact cast. He Declined this today. I recommended continuing Hydrofera Blue and Keystone. MRI did not show signs of osteomyelitis. We will go ahead and send an IVR for a skin substitute. This includes organogenesis and Grafix. I recommended he continue to aggressively offload the wound bed as he has a Psychologist, forensic. He knows  without aggressive offloading he is at high risk for infection and amputation Procedures Wound #1 Pre-procedure diagnosis of Wound #1 is a Diabetic Wound/Ulcer of the Lower Extremity located on the Right,Plantar Foot .Severity of Tissue Pre Debridement is: Fat layer exposed. There was a Excisional Skin/Subcutaneous Tissue Debridement with a total area of 2.6 sq cm performed by Geralyn Corwin, MD. With the  following instrument(s): Curette to remove Viable and Non-Viable tissue/material. Material removed includes Callus, Subcutaneous Tissue, and Slough. A time out was conducted at 08:39, prior to the start of the procedure. A Minimum amount of bleeding was controlled with Pressure. The procedure was tolerated well. Post Debridement Measurements: 1.5cm length x 0.9cm width x 0.4cm depth; 0.424cm^3 volume. Character of Wound/Ulcer Post Debridement is stable. Severity of Tissue Post Debridement is: Fat layer exposed. Post procedure Diagnosis Wound #1: Same as Pre-Procedure Plan Follow-up Appointments: Return Appointment in 1 week. Nurse Visit as needed Bathing/ Shower/ Hygiene: May shower; gently cleanse wound with antibacterial soap, rinse and pat dry prior to dressing wounds No tub bath. Anesthetic (Use 'Patient Medications' Section for Anesthetic Order Entry): Lidocaine applied to wound bed Edema Control - Lymphedema / Segmental Compressive Device / Other: Elevate, Exercise Daily and Avoid Standing for Long Periods of Time. Elevate leg(s) parallel to the floor when sitting. DO YOUR BEST to sleep in the bed at night. DO NOT sleep in your recliner. Long hours of sitting in a recliner leads to swelling of the legs and/or potential wounds on your backside. Off-Loading: Foot Defender  - right foot-keep shin covered-keep pressure off of the wound (Patient is not wear boot to clinic each week. Wearing tennis shoe) Additional Orders / Instructions: Follow Nutritious Diet and Increase Protein  Intake - monitor blood sugar to maintain normal limits Other: - Follow up post surgery for left foot-[facility] handling Medications-Please add to medication list.: P.O. Antibiotics - Continue keystone and oral antibiotics WOUND #1: - Foot Wound Laterality: Plantar, Right [name], [name] ([id number]) Cleanser: Wound Cleanser Every Other Day/30 Days Discharge Instructions: Wash your hands with soap and water. Remove old dressing, discard into plastic bag and place into trash. Cleanse the wound with Wound Cleanser prior to applying a clean dressing using gauze sponges, not tissues or cotton balls. Do not scrub or use excessive force. Pat dry using gauze sponges, not tissue or cotton balls. Topical: keystone Every Other Day/30 Days Discharge Instructions: Patient did not bring to clinic. Will do at home later tonight. Primary Dressing: Hydrofera Blue Ready Transfer Foam, 2.5x2.5 (in/in) Every Other Day/30 Days Discharge Instructions: Apply Hydrofera Blue Ready to wound bed as directed Secondary Dressing: Zetuvit Plus 4x4 (in/in) (Generic) Every Other Day/30 Days Secured With: Coban Cohesive Bandage 4x5 (yds) Stretched Every Other Day/30 Days Discharge Instructions: Apply Coban as directed. 1. In office sharp debridement 2. Hydrofera Blue and Keystone antibiotic 3. Run IVR for Grafix and organogenesis 4. Follow-up in 1 week 5. Aggressive offloading Electronic Signature(s) Signed: 07/06/2022 9:07:33 AM By: Geralyn Corwin DO Entered By: Geralyn Corwin on 07/06/2022 09:06:34 [name] ([id number]) -------------------------------------------------------------------------------- ROS/PFSH Details Patient Name: [name] Date of Service: 07/06/2022 8:15 AM Medical Record Number: [id number] Patient Account Number: [id number] Date of Birth/Sex: 09/22/1956 (66 y.o. M) Treating RN: Huel Coventry Primary Care Provider: Manson Allan Other Clinician: Betha Loa Referring Provider:  Manson Allan Treating Provider/Extender: Tilda Franco in Treatment: 5 Information Obtained From Patient Endocrine Medical History: Positive for: Type II Diabetes Time with diabetes: 40 years Treated with: Oral agents, Diet Blood sugar tested every day: No Musculoskeletal Medical History: Positive for: Osteoarthritis Immunizations Pneumococcal Vaccine: Received Pneumococcal Vaccination: Yes Received Pneumococcal Vaccination On or After 60th Birthday: Yes Implantable Devices None Family and Social History Former smoker - ended on 10/07/1997; Marital Status - Divorced; Alcohol Use: Never - stopped 4 yrs ago; Drug Use: No History; Caffeine Use: Daily Electronic Signature(s) Signed: 07/06/2022 9:07:33  AM By: Geralyn Corwin DO Signed: 07/07/2022 9:40:40 AM By: Elliot Gurney, BSN, RN, CWS, Kim RN, BSN Entered By: Geralyn Corwin on 07/06/2022 09:07:06 [name] ([id number]) -------------------------------------------------------------------------------- SuperBill Details Patient Name: [name] Date of Service: 07/06/2022 Medical Record Number: [id number] Patient Account Number: [id number] Date of Birth/Sex: August 11, 1956 (66 y.o. M) Treating RN: Huel Coventry Primary Care Provider: Manson Allan Other Clinician: Betha Loa Referring Provider: Manson Allan Treating Provider/Extender: Tilda Franco in Treatment: 70 Diagnosis Coding ICD-10 Codes Code Description [phone] 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ed E11.621 Type 2 diabetes mellitus with foot ulcer E11.40 Type 2 diabetes mellitus with diabetic neuropathy, unspecified R26.89 Other abnormalities of gait and mobility Facility Procedures CPT4 Code: [id number] Description: 11042 - DEB SUBQ TISSUE 20 SQ CM/< Modifier: Quantity: 1 CPT4 Code: Description: ICD-10 Diagnosis Description L97.512 Non-pressure chronic ulcer of other part of right foot with fat layer ex  E11.621 Type 2 diabetes mellitus with foot ulcer Modifier: posed Quantity: Physician Procedures CPT4 Code: 3244010 Description: 11042 - WC PHYS SUBQ TISS 20 SQ CM Modifier: Quantity: 1 CPT4 Code: Description: ICD-10 Diagnosis Description L97.512 Non-pressure chronic ulcer of other part of right foot with fat layer ex E11.621 Type 2 diabetes mellitus with foot ulcer Modifier: posed Quantity: Electronic Signature(s) Signed: 07/06/2022 9:07:33 AM By: Geralyn Corwin DO Entered By: Geralyn Corwin on 07/06/2022 09:06:44

## 2022-07-07 NOTE — Progress Notes (Signed)
[name], [name] ([id number]) Visit Report for 07/06/2022 Arrival Information Details Patient Name: [name], [name] Date of Service: 07/06/2022 8:15 AM Medical Record Number: [id number] Patient Account Number: [id number] Date of Birth/Sex: 1956/06/27 (65 y.o. M) Treating RN: Cornell Barman Primary Care Ricca Melgarejo: [name] Other Clinician: Massie Kluver Referring Michaella Imai: [name] Treating Anniya Whiters/Extender: Yaakov Guthrie in Treatment: 38 Visit Information History Since Last Visit All ordered tests and consults were completed: No Patient Arrived: Ambulatory Added or deleted any medications: No Arrival Time: 08:09 Any new allergies or adverse reactions: No Transfer Assistance: None Had a fall or experienced change in No Patient Requires Transmission-Based No activities of daily living that may affect Precautions: risk of falls: Patient Has Alerts: Yes Hospitalized since last visit: No Patient Alerts: Patient on Blood Pain Present Now: No Thinner DIABETIC Plavix Electronic Signature(s) Signed: 07/06/2022 5:12:58 PM By: Massie Kluver Entered By: Massie Kluver on 07/06/2022 08:09:45 [name] ([id number]) -------------------------------------------------------------------------------- Clinic Level of Care Assessment Details Patient Name: [name] Date of Service: 07/06/2022 8:15 AM Medical Record Number: [id number] Patient Account Number: [id number] Date of Birth/Sex: 22-Dec-1955 (65 y.o. M) Treating RN: Cornell Barman Primary Care Teriana Danker: [name] Other Clinician: Massie Kluver Referring Ansley Mangiapane: [name] Treating Yida Hyams/Extender: Yaakov Guthrie in Treatment: 54 Clinic Level of Care Assessment Items TOOL 1 Quantity Score []  - Use when EandM and Procedure is performed on INITIAL visit 0 ASSESSMENTS - Nursing Assessment / Reassessment []  - General Physical Exam (combine w/ comprehensive assessment (listed just below) when performed on  new 0 pt. evals) []  - 0 Comprehensive Assessment (HX, ROS, Risk Assessments, Wounds Hx, etc.) ASSESSMENTS - Wound and Skin Assessment / Reassessment []  - Dermatologic / Skin Assessment (not related to wound area) 0 ASSESSMENTS - Ostomy and/or Continence Assessment and Care []  - Incontinence Assessment and Management 0 []  - 0 Ostomy Care Assessment and Management (repouching, etc.) PROCESS - Coordination of Care []  - Simple Patient / Family Education for ongoing care 0 []  - 0 Complex (extensive) Patient / Family Education for ongoing care []  - 0 Staff obtains Programmer, systems, Records, Test Results / Process Orders []  - 0 Staff telephones HHA, Nursing Homes / Clarify orders / etc []  - 0 Routine Transfer to another Facility (non-emergent condition) []  - 0 Routine Hospital Admission (non-emergent condition) []  - 0 New Admissions / Biomedical engineer / Ordering NPWT, Apligraf, etc. []  - 0 Emergency Hospital Admission (emergent condition) PROCESS - Special Needs []  - Pediatric / Minor Patient Management 0 []  - 0 Isolation Patient Management []  - 0 Hearing / Language / Visual special needs []  - 0 Assessment of Community assistance (transportation, D/C planning, etc.) []  - 0 Additional assistance / Altered mentation []  - 0 Support Surface(s) Assessment (bed, cushion, seat, etc.) INTERVENTIONS - Miscellaneous []  - External ear exam 0 []  - 0 Patient Transfer (multiple staff / Civil Service fast streamer / Similar devices) []  - 0 Simple Staple / Suture removal (25 or less) []  - 0 Complex Staple / Suture removal (26 or more) []  - 0 Hypo/Hyperglycemic Management (do not check if billed separately) []  - 0 Ankle / Brachial Index (ABI) - do not check if billed separately Has the patient been seen at the hospital within the last three years: Yes Total Score: 0 Level Of Care: ____ [name] ([id number]) Electronic Signature(s) Signed: 07/06/2022 5:12:58 PM By: Massie Kluver Entered By:  Massie Kluver on 07/06/2022 08:46:31 [name] ([id number]) -------------------------------------------------------------------------------- Encounter Discharge Information Details Patient Name: [name] Date of Service: 07/06/2022 8:15 AM  Medical Record Number: [id number] Patient Account Number: [id number] Date of Birth/Sex: 13-May-1956 (66 y.o. M) Treating RN: Cornell Barman Primary Care Sebastian Lurz: [name] Other Clinician: Massie Kluver Referring Thaily Hackworth: [name] Treating Jermy Couper/Extender: Yaakov Guthrie in Treatment: 65 Encounter Discharge Information Items Post Procedure Vitals Discharge Condition: Stable Temperature (F): 98.0 Ambulatory Status: Ambulatory Pulse (bpm): 74 Discharge Destination: Home Respiratory Rate (breaths/min): 16 Transportation: Private Auto Blood Pressure (mmHg): 125/75 Accompanied By: self Schedule Follow-up Appointment: Yes Clinical Summary of Care: Electronic Signature(s) Signed: 07/06/2022 5:12:58 PM By: Massie Kluver Entered By: Massie Kluver on 07/06/2022 08:54:01 [name] ([id number]) -------------------------------------------------------------------------------- Lower Extremity Assessment Details Patient Name: [name] Date of Service: 07/06/2022 8:15 AM Medical Record Number: [id number] Patient Account Number: [id number] Date of Birth/Sex: 1956-10-01 (65 y.o. M) Treating RN: Cornell Barman Primary Care Jalissa Heinzelman: [name] Other Clinician: Massie Kluver Referring Leiana Rund: [name] Treating Gorman Safi/Extender: Yaakov Guthrie in Treatment: 60 Electronic Signature(s) Signed: 07/06/2022 5:12:58 PM By: Massie Kluver Signed: 07/07/2022 9:40:40 AM By: Gretta Cool, BSN, RN, CWS, Kim RN, BSN Entered By: Massie Kluver on 07/06/2022 08:18:03 [name] ([id number]) -------------------------------------------------------------------------------- Multi Wound Chart Details Patient Name: [name] Date of Service: 07/06/2022 8:15 AM Medical Record Number: [id number] Patient Account Number: [id number] Date of Birth/Sex: 26-Sep-1956 (65 y.o. M) Treating RN: Cornell Barman Primary Care Yasseen Salls: [name] Other Clinician: Massie Kluver Referring Aydeen Blume: [name] Treating Teryl Mcconaghy/Extender: Yaakov Guthrie in Treatment: 11 Vital Signs Height(in): 74 Pulse(bpm): 74 Weight(lbs): 226 Blood Pressure(mmHg): 125/75 Body Mass Index(BMI): 29 Temperature(F): 98.0 Respiratory Rate(breaths/min): 16 Photos: [N/A:N/A] Wound Location: Right, Plantar Foot N/A N/A Wounding Event: Blister N/A N/A Primary Etiology: Diabetic Wound/Ulcer of the Lower N/A N/A Extremity Comorbid History: Type II Diabetes, Osteoarthritis N/A N/A Date Acquired: 07/15/2020 N/A N/A Weeks of Treatment: 34 N/A N/A Wound Status: Open N/A N/A Wound Recurrence: No N/A N/A Measurements L x W x D (cm) 1x0.7x0.3 N/A N/A Area (cm) : 0.55 N/A N/A Volume (cm) : 0.165 N/A N/A % Reduction in Area: 51.00% N/A N/A % Reduction in Volume: 75.50% N/A N/A Classification: Grade 2 N/A N/A Exudate Amount: Medium N/A N/A Exudate Type: Serosanguineous N/A N/A Exudate Color: red, brown N/A N/A Wound Margin: Thickened N/A N/A Granulation Amount: Large (67-100%) N/A N/A Granulation Quality: Red N/A N/A Necrotic Amount: Small (1-33%) N/A N/A Exposed Structures: Fat Layer (Subcutaneous Tissue): N/A N/A Yes Fascia: No Tendon: No Muscle: No Joint: No Bone: No Epithelialization: None N/A N/A Treatment Notes Electronic Signature(s) Signed: 07/06/2022 5:12:58 PM By: Massie Kluver Entered By: Massie Kluver on 07/06/2022 08:18:16 [name] ([id number]) -------------------------------------------------------------------------------- Multi-Disciplinary Care Plan Details Patient Name: [name] Date of Service: 07/06/2022 8:15 AM Medical Record Number: [id number] Patient Account Number: [id number] Date of  Birth/Sex: 04-22-1956 (65 y.o. M) Treating RN: Cornell Barman Primary Care Velita Quirk: [name] Other Clinician: Massie Kluver Referring [name]: [name] Treating Domenique Quest/Extender: Yaakov Guthrie in Treatment: 1 Active Inactive Wound/Skin Impairment Nursing Diagnoses: Impaired tissue integrity Knowledge deficit related to smoking impact on wound healing Knowledge deficit related to ulceration/compromised skin integrity Goals: Patient/caregiver will verbalize understanding of skin care regimen Date Initiated: 11/10/2021 Date Inactivated: 12/01/2021 Target Resolution Date: 11/17/2021 Goal Status: Met Ulcer/skin breakdown will have a volume reduction of 30% by week 4 Date Initiated: 11/10/2021 Date Inactivated: 02/02/2022 Target Resolution Date: 12/08/2021 Goal Status: Met Ulcer/skin breakdown will have a volume reduction of 50% by week 8 Date Initiated: 11/10/2021 Target Resolution Date: 01/05/2022 Goal Status: Active Ulcer/skin breakdown will have a volume reduction of 80%  by week 12 Date Initiated: 11/10/2021 Target Resolution Date: 02/02/2022 Goal Status: Active Ulcer/skin breakdown will heal within 14 weeks Date Initiated: 11/10/2021 Target Resolution Date: 02/16/2022 Goal Status: Active Interventions: Assess patient/caregiver ability to obtain necessary supplies Assess patient/caregiver ability to perform ulcer/skin care regimen upon admission and as needed Assess ulceration(s) every visit Notes: Electronic Signature(s) Signed: 07/06/2022 5:12:58 PM By: Massie Kluver Signed: 07/07/2022 9:40:40 AM By: Gretta Cool, BSN, RN, CWS, Kim RN, BSN Entered By: Massie Kluver on 07/06/2022 08:18:08 [name] ([id number]) -------------------------------------------------------------------------------- Pain Assessment Details Patient Name: [name] Date of Service: 07/06/2022 8:15 AM Medical Record Number: [id number] Patient Account Number: [id number] Date of Birth/Sex:  25-Oct-1956 (65 y.o. M) Treating RN: Cornell Barman Primary Care Kimiko Common: [name] Other Clinician: Massie Kluver Referring Sadler Teschner: [name] Treating Shadonna Benedick/Extender: Yaakov Guthrie in Treatment: 36 Active Problems Location of Pain Severity and Description of Pain Patient Has Paino No Site Locations Pain Management and Medication Current Pain Management: Electronic Signature(s) Signed: 07/06/2022 5:12:58 PM By: Massie Kluver Signed: 07/07/2022 9:40:40 AM By: Gretta Cool, BSN, RN, CWS, Kim RN, BSN Entered By: Massie Kluver on 07/06/2022 08:12:31 [name] ([id number]) -------------------------------------------------------------------------------- Patient/Caregiver Education Details Patient Name: [name] Date of Service: 07/06/2022 8:15 AM Medical Record Number: [id number] Patient Account Number: [id number] Date of Birth/Gender: 02/03/1956 (65 y.o. M) Treating RN: Cornell Barman Primary Care Physician: [name] Other Clinician: Massie Kluver Referring Physician: [name] Treating Physician/Extender: Yaakov Guthrie in Treatment: 53 Education Assessment Education Provided To: Patient Education Topics Provided Wound/Skin Impairment: Handouts: Other: continue wound care as directed Methods: Explain/Verbal Responses: State content correctly Electronic Signature(s) Signed: 07/06/2022 5:12:58 PM By: Massie Kluver Entered By: Massie Kluver on 07/06/2022 08:53:15 [name] ([id number]) -------------------------------------------------------------------------------- Wound Assessment Details Patient Name: [name] Date of Service: 07/06/2022 8:15 AM Medical Record Number: [id number] Patient Account Number: [id number] Date of Birth/Sex: 09/19/1956 (65 y.o. M) Treating RN: Cornell Barman Primary Care Khaiden Segreto: [name] Other Clinician: Massie Kluver Referring Jersie Beel: [name] Treating Khira Cudmore/Extender: Yaakov Guthrie in Treatment: 34 Wound Status Wound Number: 1 Primary Etiology: Diabetic Wound/Ulcer of the Lower Extremity Wound Location: Right, Plantar Foot Wound Status: Open Wounding Event: Blister Comorbid History: Type II Diabetes, Osteoarthritis Date Acquired: 07/15/2020 Weeks Of Treatment: 34 Clustered Wound: No Photos Wound Measurements Length: (cm) 1 Width: (cm) 0.7 Depth: (cm) 0.3 Area: (cm) 0.55 Volume: (cm) 0.165 % Reduction in Area: 51% % Reduction in Volume: 75.5% Epithelialization: None Tunneling: No Undermining: No Wound Description Classification: Grade 2 Wound Margin: Thickened Exudate Amount: Medium Exudate Type: Serosanguineous Exudate Color: red, brown Foul Odor After Cleansing: No Slough/Fibrino Yes Wound Bed Granulation Amount: Large (67-100%) Exposed Structure Granulation Quality: Red Fascia Exposed: No Necrotic Amount: Small (1-33%) Fat Layer (Subcutaneous Tissue) Exposed: Yes Necrotic Quality: Adherent Slough Tendon Exposed: No Muscle Exposed: No Joint Exposed: No Bone Exposed: No Treatment Notes Wound #1 (Foot) Wound Laterality: Plantar, Right Cleanser Wound Cleanser Discharge Instruction: Wash your hands with soap and water. Remove old dressing, discard into plastic bag and place into trash. Cleanse the wound with Wound Cleanser prior to applying a clean dressing using gauze sponges, not tissues or cotton balls. Do not scrub or use excessive force. Pat dry using gauze sponges, not tissue or cotton balls. DOLORES, MCGOVERN ([id number]) Peri-Wound Care Topical keystone Discharge Instruction: Patient did not bring to clinic. Will do at home later tonight. Primary Dressing Hydrofera Blue Ready Transfer Foam, 2.5x2.5 (in/in) Discharge Instruction: Apply Hydrofera Blue Ready to wound bed as directed Secondary Dressing Zetuvit  Plus 4x4 (in/in) Secured With Coban Cohesive Bandage 4x5 (yds) Stretched Discharge Instruction: Apply Coban as  directed. Compression Wrap Compression Stockings Add-Ons Electronic Signature(s) Signed: 07/06/2022 5:12:58 PM By: Massie Kluver Signed: 07/07/2022 9:40:40 AM By: Gretta Cool, BSN, RN, CWS, Kim RN, BSN Entered By: Massie Kluver on 07/06/2022 08:17:53 [name] ([id number]) -------------------------------------------------------------------------------- Vitals Details Patient Name: [name] Date of Service: 07/06/2022 8:15 AM Medical Record Number: [id number] Patient Account Number: [id number] Date of Birth/Sex: Nov 07, 1955 (65 y.o. M) Treating RN: Cornell Barman Primary Care Jianni Batten: [name] Other Clinician: Massie Kluver Referring Eldin Bonsell: [name] Treating [place] Nonaka/Extender: Yaakov Guthrie in Treatment: 65 Vital Signs Time Taken: 08:10 Temperature (F): 98.0 Height (in): 74 Pulse (bpm): 74 Weight (lbs): 226 Respiratory Rate (breaths/min): 16 Body Mass Index (BMI): 29 Blood Pressure (mmHg): 125/75 Reference Range: 80 - 120 mg / dl Electronic Signature(s) Signed: 07/06/2022 5:12:58 PM By: Massie Kluver Entered By: Massie Kluver on 07/06/2022 08:12:28

## 2022-07-13 ENCOUNTER — Encounter (HOSPITAL_BASED_OUTPATIENT_CLINIC_OR_DEPARTMENT_OTHER): Payer: Medicare Other | Admitting: Internal Medicine

## 2022-07-13 DIAGNOSIS — E11621 Type 2 diabetes mellitus with foot ulcer: Secondary | ICD-10-CM

## 2022-07-13 DIAGNOSIS — L97512 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ed: Secondary | ICD-10-CM | POA: Diagnosis not present

## 2022-07-14 ENCOUNTER — Encounter: Payer: Self-pay | Admitting: Podiatry

## 2022-07-14 NOTE — Progress Notes (Signed)
[name] ([id number]) Visit Report for 07/13/2022 Chief Complaint Document Details Patient Name: [name] Date of Service: 07/13/2022 8:15 AM Medical Record Number: [id number] Patient Account Number: [id number] Date of Birth/Sex: Nov 19, 1955 (66 y.o. M) Treating RN: Huel Coventry Primary Care Provider: Manson Allan Other Clinician: Betha Loa Referring Provider: Manson Allan Treating Provider/Extender: Tilda Franco in Treatment: 69 Information Obtained from: Patient Chief Complaint Right plantar foot wound Electronic Signature(s) Signed: 07/13/2022 8:45:06 AM By: Geralyn Corwin DO Entered By: Geralyn Corwin on 07/13/2022 08:40:59 [name] ([id number]) -------------------------------------------------------------------------------- Debridement Details Patient Name: [name] Date of Service: 07/13/2022 8:15 AM Medical Record Number: [id number] Patient Account Number: [id number] Date of Birth/Sex: 01-13-1956 (66 y.o. M) Treating RN: Huel Coventry Primary Care Provider: Manson Allan Other Clinician: Betha Loa Referring Provider: Manson Allan Treating Provider/Extender: Tilda Franco in Treatment: 35 Debridement Performed for Wound #1 Right,Plantar Foot Assessment: Performed By: Physician Geralyn Corwin, MD Debridement Type: Debridement Severity of Tissue Pre Debridement: Fat layer exposed Level of Consciousness (Pre- Awake and Alert procedure): Pre-procedure Verification/Time Out Yes - 08:33 Taken: Start Time: 08:33 Total Area Debrided (L x W): 1.5 (cm) x 1.5 (cm) = 2.25 (cm) Tissue and other material Viable, Non-Viable, Callus, Slough, Subcutaneous, Slough debrided: Level: Skin/Subcutaneous Tissue Debridement Description: Excisional Instrument: Curette Bleeding: Minimum Hemostasis Achieved: Pressure End Time: 08:36 Response to Treatment: Procedure was tolerated well Level of Consciousness (Post- Awake and  Alert procedure): Post Debridement Measurements of Total Wound Length: (cm) 0.9 Width: (cm) 0.6 Depth: (cm) 0.3 Volume: (cm) 0.127 Character of Wound/Ulcer Post Debridement: Stable Severity of Tissue Post Debridement: Fat layer exposed Post Procedure Diagnosis Same as Pre-procedure Electronic Signature(s) Signed: 07/13/2022 8:45:06 AM By: Geralyn Corwin DO Signed: 07/13/2022 5:04:43 PM By: Betha Loa Signed: 07/14/2022 11:19:49 AM By: Elliot Gurney, BSN, RN, CWS, Kim RN, BSN Entered By: Betha Loa on 07/13/2022 08:37:01 [name] ([id number]) -------------------------------------------------------------------------------- HPI Details Patient Name: [name] Date of Service: 07/13/2022 8:15 AM Medical Record Number: [id number] Patient Account Number: [id number] Date of Birth/Sex: [birth year] (66 y.o. M) Treating RN: Huel Coventry Primary Care Provider: Manson Allan Other Clinician: Betha Loa Referring Provider: Manson Allan Treating Provider/Extender: Tilda Franco in Treatment: 35 History of Present Illness HPI Description: Admission 11/10/2021 [name] is a 66 year old male with a past medical history of uncontrolled type 2 diabetes with last hemoglobin A1c of 8.1 complicated by peripheral neuropathy that presents to the clinic for a 2-year history of nonhealing ulcer to the bottom of his right foot. He states this started out as a blister caused by a work boot. He reports receiving wound care when he resided in [place]. He is reestablishing his wound care in [facility] ([place] today. He is currently keeping the area clean and covered. He has insoles designed to help offload the wound bed. He currently denies signs of infection. 1/18; patient presents for follow-up. He has been using Hydrofera Blue to the wound bed. He has no issues or complaints today. He has not received the defender.[name] He denies signs of infection. 2/1; patient presents for  follow-up. He has been using Hydrofera Blue to the wound bed he states he received the defender boot and has been using it however he did not have it on today. He currently denies signs of infection to the right foot. Unfortunately he developed a wound to the left great toe over the past week. He states he received new orthotics and these caused a blister to the left great toe which turned into a  wound. He has not been doing anything to the wound bed. He currently denies systemic signs of infection. 2/8; Patient presents for follow-up. 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 He states he is about 90% recovered. He has some mild weakness to the left side. He reports taking the antibiotics prescribed at last clinic visit. He reports using Hydrofera Blue for dressing changes. He only uses the offloading boot when he is at home. He uses open toed shoes to the right foot. He currently denies signs of infection. 2/15; patient presents for follow-up. He states he is still taking the antibiotics prescribed. He reports using the defender boot to the right foot and open toed shoe to the left foot however he is not wearing either today. He currently denies systemic signs of infection. He stated he cut down a tree last week and was outside doing yard work. 2/22; patient presents for follow-up. 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 He reports feeling well. He reports using the defender boot to the right leg and continues to use Hydrofera Blue with dressing changes. He denies systemic signs of infection. 3/8; patient presents for follow-up. He sees Dr. Alberteen Spindleline tomorrow for follow-up of the left great toe amputation. This was a partial amputation. He reports he is taking Augmentin currently. He reports increased erythema to the left great toe amputation site. He also  reports a decline in his right plantar foot wound. He reports it is draining more. He denies purulent drainage. 3/15; patient presents for follow-up. He did not see Dr. Alberteen Spindleline last week but states he sees him today. He states he has been taking doxycycline in the past week and started gentamicin ointment. He continues to use [facility] with dressing changes. He currently denies systemic signs of infection. 3/22; patient presents for follow-up. Patient had partial amputation of the left great toe on 2/15. 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 Alberteen Spindleline. He reports no issues and has no complaints. He is currently taking Keflex and doxycycline prescribed by Dr. Alberteen Spindleline. He continues to use gentamicin ointment and Hydrofera Blue to the right foot wound. He reports using the defender boot when he is at home. 3/29; patient presents for follow-up. He continues to be on antibiotics per podiatry. He has been using Hydrofera Blue and gentamicin ointment to the right plantar foot wound. He has no issues or complaints today. He denies signs of infection. 4/5; patient presents for follow-up. He has been using Dakin's wet-to-dry dressings with improvement to wound healing. He denies signs of infection. She has no issues or complaints today. 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 Use Hydrofera Blue for a prolonged period of time and has been using Dakin's wet- to-dry I think for the last 2 to 3 weeks. He has a Psychologist, forensicdefender boot at home but he does not wear that coming into the clinic because he drives. He tells me he has been compliant with the defender boot short of when he goes out to drive. He lives alone. He also tells me he has had a previous left great toe amputation We are following him for the right foot wound. 4/26; patient presents for follow-up. He had worsening of his left foot wound and had surgical debridement on 4/16 by Dr. Fanny SkatesSutherland. He is  being  followed by podiatry for this issue. He he is on IV Unasyn based on tissue culture by ID.[name] We are following him for the right foot wound. He has been using Dakin's wet-to-dry dressings without issues. He reports continued usage of the defender boot. 5/3; patient presents for follow-up. He has been using Dakin's wet-to-dry dressings to the right foot wound. He has no issues or complaints today. He reports using the Psychologist, forensic. He reports offloading the foot wound when he is at home by sitting in a recliner. He denies signs of infection. He is still getting IV antibiotics For the left foot wound that is being followed by podiatry. He states the end date is later this month on 5/28. 5/17; patient presents for follow-up. He missed his last clinic appointment. He has been using Dakin's wet-to-dry dressings. He states he is offloading the right foot wound with the defender boot although he does not have this today. He is still on IV antibiotics. He has no issues or complaints today. 5/24; patient presents for follow-up. He has been using Hydrofera Blue to the wound bed without issues. He denies signs of infection. At last clinic [facility], [name] ([id number]) visit he had a wound culture done that detected high levels of Enterococcus faecalis and low levels of Corynebacterium stratum and Staphylococcus epidermidis. Keystone antibiotics were ordered. He has not heard from the company yet. 5/31; patient presents for follow-up. He continues to use Hydrofera Blue to the wound bed. He denies signs of infection. Keystone antibiotics were ordered at last clinic visit and he states he called the company to pay for it. He states that it is being shipped. We rediscussed the total contact cast and he declines having this placed today. He states he is wearing the defender boot however he never wears it into the office. 04-06-2022 upon evaluation today patient appears to be doing about the same in regard to his  wound. This is not significantly smaller. He is not wearing his offloading boot. We discussed that today he tells me that he wears it "all the time as he chuckled and does not have it on during the office visit today. I think it is increasingly obvious that he is just messing around when he says this based on what I am seeing and otherwise I do not know why he would wear it all the time but not to the clinic. Either way my opinion based on what I am seeing currently is simply that the wound does seem to be still having quite a bit of callus buildup around the edges it is also very dry. He does tell me that he has been using the [facility] since he got it and to be honest I think this is good and should hopefully help keep things under control but at the same time I do not see any evidence of active infection. Again the Jodie Echevaria is a topical antibiotic with multiple compounds to help prevent further infection and worsening overall. 6/14; patient presents for follow-up. He has been using Keystone antibiotics with [employer]	[place] daily. He states he is wearing his offloading boot. He does not have this in office today. He is going to the beach for the next 5 days. He states he Will not be using using any offloading device During this time. I again offered the total contact cast however he declined. I did ask him to reconsider for next clinic visit. He currently denies signs of infection. 6/21; patient presents for follow-up. He went  to the beach last week. He did not use an offloading device. Wound is slightly deeper today. I again offered the total contact cast but he declined. Currently denies signs of infection. He reports using the [place], hydrofera blue and the Psychologist, forensic. 6/28; patient presents for follow-up. Patient declines total contact cast today. He has been using Keystone and [facility] to the wound bed. It is unclear if he is using the Psychologist, forensic. He would like a letter for the  post man to delivers mail to his front door so he does not have to walk down the driveway. 7/5; patient presents for follow-up. He states he is considering the total contact cast however does not want this placed today. He is actually going to the gym after this appointment. He does not want offloading foot wear for this. He has been using Keystone and [facility] to the wound bed. He denies signs of infection. 7/12; patient presents for follow-up. He is still considering the total contact cast however does not want this today. He has been using Keystone antibiotic and Hydrofera Blue to the wound bed. He denies signs of infection. He has no issues or complaints today. 7/26; patient presents for follow-up. He declines a total contact cast. He has been using his an antibiotic and Hydrofera Blue to the wound bed. He denies systemic signs of infection. He is scheduled to discuss orthotics next week with his podiatrist. 8/2; patient presents for follow-up. He has been using Hydrofera Blue to the wound bed. He has no issues or complaints today. He declines a total contact cast. He states he is getting orthotics today. 8/9; patient presents for follow-up. He has been using Hydrofera Blue to the wound bed. He again declines the total contact cast. He does state that he is considering starting it at the end of the month. He has a vacation coming up and does not want to have it in place for that. 8/16; patient presents for follow-up. He has been using Hydrofera Blue and Keystone to the wound bed. He started taking Augmentin and doxycycline as prescribed. He denies signs of infection. He is going on his vacation next week and will be back in 2 weeks. He states he is considering the total contact cast for when he comes back from his trip. 8/30; patient present for follow-up. He has been using Hydrofera Blue and Keystone to the wound bed. He continues to take doxycycline and Augmentin without issues. He went  on vacation and reports walking for long periods of time without pressure relief to the wound bed. He currently denies systemic signs of infection. 9/6; patient presents for follow-up. He has been using Hydrofera Blue and Keystone to the wound bed. He is not wearing his offloading boot. He declines a total contact cast. He has finished Augmentin and doxycycline. He had an MRI completed on 07/01/2022 that did not show an underlying abscess, septic arthritis or osteomyelitis. He currently denies systemic signs of infection. 9/13; patient presents for follow-up. Has been using Hydrofera Blue and Keystone to the wound bed. He does not present with his offloading boot. He declines the total contact cast. He has been approved for PuraPly. We discussed this today and he would like this placed at next clinic visit. He denies signs of infection. Electronic Signature(s) Signed: 07/13/2022 8:45:06 AM By: Geralyn Corwin DO Entered By: Geralyn Corwin on 07/13/2022 08:41:52 [name] ([id number]) -------------------------------------------------------------------------------- Physical Exam Details Patient Name: [name] Date of Service: 07/13/2022 8:15 AM  Medical Record Number: [id number] Patient Account Number: [id number] Date of Birth/Sex: 11/13/1955 (66 y.o. M) Treating RN: Huel Coventry Primary Care Provider: Manson Allan Other Clinician: Betha Loa Referring Provider: Manson Allan Treating Provider/Extender: Tilda Franco in Treatment: 35 Constitutional . Cardiovascular . Psychiatric . Notes Right foot: To the plantar aspect of the first metatarsal head there is an open wound with granulation tissue, Nonviable tissue and callus. There is significant undermining to the 7o11 o'clock position. No increased warmth, erythema or purulent drainage noted. Electronic Signature(s) Signed: 07/13/2022 8:45:06 AM By: Geralyn Corwin DO Entered By: Geralyn Corwin on 07/13/2022  08:42:34 [name] ([id number]) -------------------------------------------------------------------------------- Physician Orders Details Patient Name: [name] Date of Service: 07/13/2022 8:15 AM Medical Record Number: [id number] Patient Account Number: [id number] Date of Birth/Sex: Aug 21, 1956 (66 y.o. M) Treating RN: Huel Coventry Primary Care Provider: Manson Allan Other Clinician: Betha Loa Referring Provider: Manson Allan Treating Provider/Extender: Tilda Franco in Treatment: 63 Verbal / Phone Orders: No Diagnosis Coding Follow-up Appointments o Return Appointment in 1 week. o Nurse Visit as needed Bathing/ Shower/ Hygiene o May shower; gently cleanse wound with antibacterial soap, rinse and pat dry prior to dressing wounds o No tub bath. Anesthetic (Use 'Patient Medications' Section for Anesthetic Order Entry) o Lidocaine applied to wound bed Cellular or Tissue Based Products o Cellular or Tissue Based Product Type: - order Puraply AM Edema Control - Lymphedema / Segmental Compressive Device / Other o Elevate, Exercise Daily and Avoid Standing for Long Periods of Time. o Elevate leg(s) parallel to the floor when sitting. o DO YOUR BEST to sleep in the bed at night. DO NOT sleep in your recliner. Long hours of sitting in a recliner leads to swelling of the legs and/or potential wounds on your backside. Off-Loading o Foot Defender o - right foot-keep shin covered-keep pressure off of the wound (Patient is not wear boot to clinic each week. Wearing tennis shoe) Additional Orders / Instructions o Follow Nutritious Diet and Increase Protein Intake - monitor blood sugar to maintain normal limits o Other: - Follow up post surgery for left foot-[facility] handling Medications-Please add to medication list. o P.O. Antibiotics - Continue keystone and oral antibiotics Wound Treatment Wound #1 - Foot Wound Laterality: Plantar,  Right Cleanser: Wound Cleanser Every Other Day/30 Days Discharge Instructions: Wash your hands with soap and water. Remove old dressing, discard into plastic bag and place into trash. Cleanse the wound with Wound Cleanser prior to applying a clean dressing using gauze sponges, not tissues or cotton balls. Do not scrub or use excessive force. Pat dry using gauze sponges, not tissue or cotton balls. Topical: keystone Every Other Day/30 Days Discharge Instructions: Patient did not bring to clinic. Will do at home later tonight. Primary Dressing: Hydrofera Blue Ready Transfer Foam, 2.5x2.5 (in/in) Every Other Day/30 Days Discharge Instructions: Apply Hydrofera Blue Ready to wound bed as directed Secondary Dressing: Zetuvit Plus 4x4 (in/in) (Generic) Every Other Day/30 Days Secured With: Coban Cohesive Bandage 4x5 (yds) Stretched Every Other Day/30 Days Discharge Instructions: Apply Coban as directed. Electronic Signature(s) Signed: 07/13/2022 8:45:06 AM By: Geralyn Corwin DO Entered By: Geralyn Corwin on 07/13/2022 08:44:42 [name] ([id number]) [name] ([id number]) -------------------------------------------------------------------------------- Problem List Details Patient Name: [name] Date of Service: 07/13/2022 8:15 AM Medical Record Number: [id number] Patient Account Number: [id number] Date of Birth/Sex: Jan 22, 1956 (66 y.o. 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 11/10/2021 No Yes exposed E11.621 Type 2 diabetes mellitus with foot ulcer 11/10/2021 No Yes E11.40 Type 2 diabetes mellitus with diabetic neuropathy, unspecified 11/10/2021 No Yes R26.89 Other  abnormalities of gait and mobility 11/10/2021 No Yes Inactive Problems ICD-10 Code Description Active Date Inactive Date L97.528 Non-pressure chronic ulcer of other part of left foot with other specified 12/01/2021 12/01/2021 severity Resolved Problems Electronic Signature(s) Signed: 07/13/2022 8:45:06 AM By: Geralyn CorwinHoffman, Tori Dattilio DO Entered By: Geralyn CorwinHoffman, Haya Hemler on 07/13/2022 08:40:55 [name] MatinNEELY, [name] ([id number]) -------------------------------------------------------------------------------- Progress Note Details Patient Name: [name] MatinNEELY, [name] Date of Service: 07/13/2022 8:15 AM Medical Record Number: [id number] Patient Account Number: [id number] Date of Birth/Sex: 1955/11/13 (66 y.o. M) Treating RN: Huel CoventryWoody, Kim Primary Care Provider: Manson AllanFields, Glenda Other Clinician: Betha LoaVenable, Angie Referring Provider: Manson AllanFields, Glenda Treating Provider/Extender: Tilda FrancoHoffman, Mirela Parsley Weeks in Treatment: 7335 Subjective Chief Complaint Information obtained from Patient Right plantar foot wound History of Present Illness (HPI) Admission 11/10/2021 Mr. [name] MatinMichael [name] is a 66 year old male with a past medical history of uncontrolled type 2 diabetes with last hemoglobin A1c of 8.1 complicated by peripheral neuropathy that presents to the clinic for a 2-year history of nonhealing ulcer to the bottom of his right foot. He states this started out as a blister caused by a work boot. He reports [place] wound care [place] [facility]. He is currently keeping the area clean and covered. He has insoles designed to help offload the wound bed. He currently denies signs of infection. 1/18; patient presents for follow-up. He has been using Hydrofera Blue to the wound bed. He has no issues or complaints today. He has not received the defender.[name]. He denies signs of infection. 2/1; patient presents for follow-up. He has been using Hydrofera Blue to the wound bed he  states he received the defender boot and has been using it however he did not have it on today. He currently denies signs of infection to the right foot. Unfortunately he developed a wound to the left great toe over the past week. He states he received new orthotics and these caused a blister to the left great toe which turned into a wound. He has not been doing anything to the wound bed. He currently denies systemic signs of infection. 2/8; Patient presents for follow-up. 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 He states he is about 90% recovered. He has some mild weakness to the left side. He reports taking the antibiotics prescribed at last clinic visit. He reports using Hydrofera Blue for dressing changes. He only uses the offloading boot when he is at home. He uses open toed shoes to the right foot. He currently denies signs of infection. 2/15; patient presents for follow-up. He states he is still taking the antibiotics prescribed. He reports using the defender boot to the right foot and open toed shoe to the left foot however he is not wearing either today. He currently denies systemic signs of infection. He stated he cut down a tree last week and was outside doing yard work. 2/22; patient presents for follow-up. 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 He reports feeling well. He reports using the Charity fundraiserdefender  boot to the right leg and continues to use Hydrofera Blue with dressing changes. He denies systemic signs of infection. 3/8; patient presents for follow-up. He sees Dr. Alberteen Spindle tomorrow for follow-up of the left great toe amputation. This was a partial amputation. He reports he is taking Augmentin currently. He reports increased erythema to the left great toe amputation site. He also reports a decline in his right plantar foot wound. He reports  it is draining more. He denies purulent drainage. 3/15; patient presents for follow-up. He did not see Dr. Alberteen Spindle last week but states he sees him today. He states he has been taking doxycycline in the past week and started gentamicin ointment. He continues to use [facility] with dressing changes. He currently denies systemic signs of infection. 3/22; patient presents for follow-up. Patient had partial amputation of the left great toe on 2/15. 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 Alberteen Spindle. He reports no issues and has no complaints. He is currently taking Keflex and doxycycline prescribed by Dr. Alberteen Spindle. He continues to use gentamicin ointment and Hydrofera Blue to the right foot wound. He reports using the defender boot when he is at home. 3/29; patient presents for follow-up. He continues to be on antibiotics per podiatry. He has been using Hydrofera Blue and gentamicin ointment to the right plantar foot wound. He has no issues or complaints today. He denies signs of infection. 4/5; patient presents for follow-up. He has been using Dakin's wet-to-dry dressings with improvement to wound healing. He denies signs of infection. She has no issues or complaints today. 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 Use Hydrofera Blue for a prolonged period of time and has been using Dakin's wet- to-dry I think for the last 2 to 3 weeks. He has a Psychologist, forensic at home but he does not wear that coming into the clinic because he drives. He tells me he has been compliant with the defender boot short of when he goes out to drive. He lives alone. He also tells me he has had a previous left great toe amputation We are following him for the right foot wound. 4/26; patient presents for follow-up. He had worsening of his left foot wound and had surgical debridement on 4/16 by Dr. Fanny Skates. He is being followed by podiatry for this issue. He he is on IV Unasyn  based on tissue culture by ID.[name] We are following him for the right foot wound. He has been using Dakin's wet-to-dry dressings without issues. He reports continued usage of the defender boot. 5/3; patient presents for follow-up. He has been using Dakin's wet-to-dry dressings to the right foot wound. He has no issues or complaints today. He reports using the Psychologist, forensic. He reports offloading the foot wound when he is at home by sitting in a recliner. He denies signs of infection. He is still getting IV antibiotics For the left foot wound that is being followed by podiatry. He states the end date is later this month on 5/28. LYNARD, POSTLEWAIT ([id number]) 5/17; patient presents for follow-up. He missed his last clinic appointment. He has been using Dakin's wet-to-dry dressings. He states he is offloading the right foot wound with the defender boot although he does not have this today. He is still on IV antibiotics. He has no issues or complaints today. 5/24; patient presents for follow-up. He has been using Hydrofera Blue to the wound bed without issues. He denies signs of infection.  At last clinic visit he had a wound culture done that detected high levels of Enterococcus faecalis and low levels of Corynebacterium stratum and Staphylococcus epidermidis. Keystone antibiotics were ordered. He has not heard from the company yet. 5/31; patient presents for follow-up. He continues to use Hydrofera Blue to the wound bed. He denies signs of infection. Keystone antibiotics were ordered at last clinic visit and he states he called the company to pay for it. He states that it is being shipped. We rediscussed the total contact cast and he declines having this placed today. He states he is wearing the defender boot however he never wears it into the office. 04-06-2022 upon evaluation today patient appears to be doing about the same in regard to his wound. This is not significantly smaller. He is not wearing his  offloading boot. We discussed that today he tells me that he wears it "all the time as he chuckled and does not have it on during the office visit today. I think it is increasingly obvious that he is just messing around when he says this based on what I am seeing and otherwise I do not know why he would wear it all the time but not to the clinic. Either way my opinion based on what I am seeing currently is simply that the wound does seem to be still having quite a bit of callus buildup around the edges it is also very dry. He does tell me that he has been using the [facility] since he got it and to be honest I think this is good and should hopefully help keep things under control but at the same time I do not see any evidence of active infection. Again the Jodie Echevaria is a topical antibiotic with multiple compounds to help prevent further infection and worsening overall. 6/14; patient presents for follow-up. He has been using Keystone antibiotics with [employer]	[place] daily. He states he is wearing his offloading boot. He does not have this in office today. He is going to the beach for the next 5 days. He states he Will not be using using any offloading device During this time. I again offered the total contact cast however he declined. I did ask him to reconsider for next clinic visit. He currently denies signs of infection. 6/21; patient presents for follow-up. He went to the beach last week. He did not use an offloading device. Wound is slightly deeper today. I again offered the total contact cast but he declined. Currently denies signs of infection. He reports using the [place], hydrofera blue and the Psychologist, forensic. 6/28; patient presents for follow-up. Patient declines total contact cast today. He has been using Keystone and [facility] to the wound bed. It is unclear if he is using the Psychologist, forensic. He would like a letter for the post man to delivers mail to his front door so he does not have to  walk down the driveway. 7/5; patient presents for follow-up. He states he is considering the total contact cast however does not want this placed today. He is actually going to the gym after this appointment. He does not want offloading foot wear for this. He has been using Keystone and [facility] to the wound bed. He denies signs of infection. 7/12; patient presents for follow-up. He is still considering the total contact cast however does not want this today. He has been using Keystone antibiotic and Hydrofera Blue to the wound bed. He denies signs of infection. He has  no issues or complaints today. 7/26; patient presents for follow-up. He declines a total contact cast. He has been using his an antibiotic and Hydrofera Blue to the wound bed. He denies systemic signs of infection. He is scheduled to discuss orthotics next week with his podiatrist. 8/2; patient presents for follow-up. He has been using Hydrofera Blue to the wound bed. He has no issues or complaints today. He declines a total contact cast. He states he is getting orthotics today. 8/9; patient presents for follow-up. He has been using Hydrofera Blue to the wound bed. He again declines the total contact cast. He does state that he is considering starting it at the end of the month. He has a vacation coming up and does not want to have it in place for that. 8/16; patient presents for follow-up. He has been using Hydrofera Blue and Keystone to the wound bed. He started taking Augmentin and doxycycline as prescribed. He denies signs of infection. He is going on his vacation next week and will be back in 2 weeks. He states he is considering the total contact cast for when he comes back from his trip. 8/30; patient present for follow-up. He has been using Hydrofera Blue and Keystone to the wound bed. He continues to take doxycycline and Augmentin without issues. He went on vacation and reports walking for long periods of time without  pressure relief to the wound bed. He currently denies systemic signs of infection. 9/6; patient presents for follow-up. He has been using Hydrofera Blue and Keystone to the wound bed. He is not wearing his offloading boot. He declines a total contact cast. He has finished Augmentin and doxycycline. He had an MRI completed on 07/01/2022 that did not show an underlying abscess, septic arthritis or osteomyelitis. He currently denies systemic signs of infection. 9/13; patient presents for follow-up. Has been using Hydrofera Blue and Keystone to the wound bed. He does not present with his offloading boot. He declines the total contact cast. He has been approved for PuraPly. We discussed this today and he would like this placed at next clinic visit. He denies signs of infection. Objective Constitutional Vitals Time Taken: 8:14 AM, Height: 74 in, Weight: 226 lbs, BMI: 29, Temperature: 97.6 F, Pulse: 69 bpm, Respiratory Rate: 18 breaths/min, Kreger, Chane ([id number]) Blood Pressure: 148/86 mmHg. General Notes: Right foot: To the plantar aspect of the first metatarsal head there is an open wound with granulation tissue, Nonviable tissue and callus. There is significant undermining to the 7 11 o'clock position. No increased warmth, erythema or purulent drainage noted. Integumentary (Hair, Skin) Wound #1 status is Open. Original cause of wound was Blister. The date acquired was: 07/15/2020. The wound has been in treatment 35 weeks. The wound is located on the Right,Plantar Foot. The wound measures 0.9cm length x 0.6cm width x 0.3cm depth; 0.424cm^2 area and 0.127cm^3 volume. There is Fat Layer (Subcutaneous Tissue) exposed. There is a medium amount of serosanguineous drainage noted. The wound margin is thickened. There is large (67-100%) red granulation within the wound bed. There is a small (1-33%) amount of necrotic tissue within the wound bed including Adherent Slough. 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 I debrided nonviable tissue. I recommended continue with Hydrofera Blue and Keystone for now. He has been  approved for PuraPly AM and we will order this for next clinic visit. I recommended continuing to aggressively offload the area and use his defender.. Procedures Wound #1 Pre-procedure diagnosis of Wound #1 is a Diabetic Wound/Ulcer of the Lower Extremity located on the Right,Plantar Foot .Severity of Tissue Pre Debridement is: Fat layer exposed. There was a Excisional Skin/Subcutaneous Tissue Debridement with a total area of 2.25 sq cm performed by Geralyn Corwin, MD. With the following instrument(s): Curette to remove Viable and Non-Viable tissue/material. Material removed includes Callus, Subcutaneous Tissue, and Slough. A time out was conducted at 08:33, prior to the start of the procedure. A Minimum amount of bleeding was controlled with Pressure. The procedure was tolerated well. Post Debridement Measurements: 0.9cm length x 0.6cm width x 0.3cm depth; 0.127cm^3 volume. Character of Wound/Ulcer Post Debridement is stable. Severity of Tissue Post Debridement is: Fat layer exposed. Post procedure Diagnosis Wound #1: Same as Pre-Procedure Plan Follow-up Appointments: Return Appointment in 1 week. Nurse Visit as needed Bathing/ Shower/ Hygiene: May shower; gently cleanse wound with antibacterial soap, rinse and pat dry prior to dressing wounds No tub bath. Anesthetic (Use 'Patient Medications' Section for Anesthetic Order Entry): Lidocaine applied to wound bed Cellular or Tissue Based Products: Cellular or Tissue Based Product Type: - order Puraply AM Edema Control - Lymphedema / Segmental Compressive Device /  Other: Elevate, Exercise Daily and Avoid Standing for Long Periods of Time. Elevate leg(s) parallel to the floor when sitting. DO YOUR BEST to sleep in the bed at night. DO NOT sleep in your recliner. Long hours of sitting in a recliner leads to swelling of the legs and/or potential wounds on your backside. Off-Loading: Foot Defender  - right foot-keep shin covered-keep pressure off of the wound (Patient is not wear boot to clinic each week. Wearing tennis shoe) Additional Orders / Instructions: Follow Nutritious Diet and Increase Protein Intake - monitor blood sugar to maintain normal limits Matusik, Nakia ([id number]) Other: - Follow up post surgery for left foot-[facility] handling Medications-Please add to medication list.: P.O. Antibiotics - Continue keystone and oral antibiotics WOUND #1: - Foot Wound Laterality: Plantar, Right Cleanser: Wound Cleanser Every Other Day/30 Days Discharge Instructions: Wash your hands with soap and water. Remove old dressing, discard into plastic bag and place into trash. Cleanse the wound with Wound Cleanser prior to applying a clean dressing using gauze sponges, not tissues or cotton balls. Do not scrub or use excessive force. Pat dry using gauze sponges, not tissue or cotton balls. Topical: keystone Every Other Day/30 Days Discharge Instructions: Patient did not bring to clinic. Will do at home later tonight. Primary Dressing: Hydrofera Blue Ready Transfer Foam, 2.5x2.5 (in/in) Every Other Day/30 Days Discharge Instructions: Apply Hydrofera Blue Ready to wound bed as directed Secondary Dressing: Zetuvit Plus 4x4 (in/in) (Generic) Every Other Day/30 Days Secured With: Coban Cohesive Bandage 4x5 (yds) Stretched Every Other Day/30 Days Discharge Instructions: Apply Coban as directed. 1. In office sharp debridement 2. Hydrofera Blue and Keystone antibiotic ointment 3. Follow-up in 1 week 4. PuraPly AM for next visit Electronic  Signature(s) Signed: 07/13/2022 8:45:06 AM By: Geralyn Corwin DO Entered By: Geralyn Corwin on 07/13/2022 08:43:35 [name] ([id number]) -------------------------------------------------------------------------------- SuperBill Details Patient Name: [name] Date of Service: 07/13/2022 Medical Record Number: [id number] Patient Account Number: [id number] Date of Birth/Sex: Jul 24, 1956 (66 y.o. M) Treating RN: Huel Coventry Primary Care Provider: Manson Allan Other Clinician: Betha Loa Referring Provider: Manson Allan Treating Provider/Extender: Tilda Franco in Treatment: 60  Diagnosis Coding ICD-10 Codes Code Description L97.512 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ed E11.621 Type 2 diabetes mellitus with foot ulcer E11.40 Type 2 diabetes mellitus with diabetic neuropathy, unspecified R26.89 Other abnormalities of gait and mobility Facility Procedures CPT4 Code: 16109604 Description: 11042 - DEB SUBQ TISSUE 20 SQ CM/< Modifier: Quantity: 1 CPT4 Code: Description: ICD-10 Diagnosis Description L97.512 Non-pressure chronic ulcer of other part of right foot with fat layer ex E11.621 Type 2 diabetes mellitus with foot ulcer Modifier: posed Quantity: Physician Procedures CPT4 Code: 5409811 Description: 11042 - WC PHYS SUBQ TISS 20 SQ CM Modifier: Quantity: 1 CPT4 Code: Description: ICD-10 Diagnosis Description L97.512 Non-pressure chronic ulcer of other part of right foot with fat layer ex E11.621 Type 2 diabetes mellitus with foot ulcer Modifier: posed Quantity: Electronic Signature(s) Signed: 07/13/2022 8:45:06 AM By: Geralyn Corwin DO Entered By: Geralyn Corwin on 07/13/2022 08:44:04

## 2022-07-14 NOTE — Progress Notes (Signed)
DAYVON, DAX ([id number]) Visit Report for 07/13/2022 Arrival Information Details Patient Name: [name], [name] Date of Service: 07/13/2022 8:15 AM Medical Record Number: [id number] Patient Account Number: [id number] Date of Birth/Sex: 03-Jul-1956 (65 y.o. M) Treating RN: Cornell Barman Primary Care Jasenia Weilbacher: [name] Other Clinician: Massie Kluver Referring Emelia Sandoval: [name] Treating Belinda Schlichting/Extender: Yaakov Guthrie in Treatment: 8 Visit Information History Since Last Visit All ordered tests and consults were completed: No Patient Arrived: Ambulatory Added or deleted any medications: No Arrival Time: 08:09 Any new allergies or adverse reactions: No Transfer Assistance: None Had a fall or experienced change in No Patient Requires Transmission-Based No activities of daily living that may affect Precautions: risk of falls: Patient Has Alerts: Yes Hospitalized since last visit: No Patient Alerts: Patient on Blood Pain Present Now: Yes Thinner DIABETIC Plavix Electronic Signature(s) Signed: 07/13/2022 5:04:43 PM By: Massie Kluver Entered By: Massie Kluver on 07/13/2022 08:13:09 [name] ([id number]) -------------------------------------------------------------------------------- Clinic Level of Care Assessment Details Patient Name: [name] Date of Service: 07/13/2022 8:15 AM Medical Record Number: [id number] Patient Account Number: [id number] Date of Birth/Sex: 1955-11-18 (65 y.o. M) Treating RN: Cornell Barman Primary Care Lunette Tapp: [name] Other Clinician: Massie Kluver Referring Seleny Allbright: [name] Treating Zeb Rawl/Extender: Yaakov Guthrie in Treatment: 35 Clinic Level of Care Assessment Items TOOL 1 Quantity Score _0  - Use when EandM and Procedure is performed on INITIAL visit 0 ASSESSMENTS - Nursing Assessment / Reassessment _1  - General Physical Exam (combine w/ comprehensive assessment (listed just below) when  performed on new 0 pt. evals) _2  - 0 Comprehensive Assessment (HX, ROS, Risk Assessments, Wounds Hx, etc.) ASSESSMENTS - Wound and Skin Assessment / Reassessment _3  - Dermatologic / Skin Assessment (not related to wound area) 0 ASSESSMENTS - Ostomy and/or Continence Assessment and Care _4  - Incontinence Assessment and Management 0 _5  - 0 Ostomy Care Assessment and Management (repouching, etc.) PROCESS - Coordination of Care _6  - Simple Patient / Family Education for ongoing care 0 _7  - 0 Complex (extensive) Patient / Family Education for ongoing care _8  - 0 Staff obtains Programmer, systems, Records, Test Results / Process Orders _9  - 0 Staff telephones HHA, Nursing Homes / Clarify orders / etc _10  - 0 Routine Transfer to another Facility (non-emergent condition) _11  - 0 Routine Hospital Admission (non-emergent condition) _12  - 0 New Admissions / Biomedical engineer / Ordering NPWT, Apligraf, etc. _13  - 0 Emergency Hospital Admission (emergent condition) PROCESS - Special Needs _14  - Pediatric / Minor Patient Management 0 _15  - 0 Isolation Patient Management _16  - 0 Hearing / Language / Visual special needs _17  - 0 Assessment of Community assistance (transportation, D/C planning, etc.) _18  - 0 Additional assistance / Altered mentation _19  - 0 Support Surface(s) Assessment (bed, cushion, seat, etc.) INTERVENTIONS - Miscellaneous _20  - External ear exam 0 _21  - 0 Patient Transfer (multiple staff / Civil Service fast streamer / Similar devices) _22  - 0 Simple Staple / Suture removal (25 or less) _23  - 0 Complex Staple / Suture removal (26 or more) _24  - 0 Hypo/Hyperglycemic Management (do not check if billed separately) _25  - 0 Ankle / Brachial Index (ABI) - do not check if billed separately Has the patient been seen at the hospital within the last three years: Yes Total Score: 0 Level Of Care: ____ [name] ([id number]) Electronic Signature(s) Signed: 07/13/2022 5:04:43 PM By: Massie Kluver Entered By: Massie Kluver on 07/13/2022 08:39:05 [name] ([id number]) -------------------------------------------------------------------------------- Encounter Discharge Information Details Patient Name: [name] Date of Service: 07/13/2022 8:15 AM  Medical Record Number: [id number] Patient Account Number: [id number] Date of Birth/Sex: 07-Oct-1956 (66 y.o. M) Treating RN: Cornell Barman Primary Care Analeah Brame: [name] Other Clinician: Massie Kluver Referring Allessandra Bernardi: [name] Treating Casilda Pickerill/Extender: Yaakov Guthrie in Treatment: 40 Encounter Discharge Information Items Post Procedure Vitals Discharge Condition: Stable Temperature (F): 97.6 Ambulatory Status: Ambulatory Pulse (bpm): 69 Discharge Destination: Home Respiratory Rate (breaths/min): 18 Transportation: Private Auto Blood Pressure (mmHg): 148/86 Accompanied By: self Schedule Follow-up Appointment: Yes Clinical Summary of Care: Electronic Signature(s) Signed: 07/13/2022 5:04:43 PM By: Massie Kluver Entered By: Massie Kluver on 07/13/2022 08:47:42 [name] ([id number]) -------------------------------------------------------------------------------- Lower Extremity Assessment Details Patient Name: [name] Date of Service: 07/13/2022 8:15 AM Medical Record Number: [id number] Patient Account Number: [id number] Date of Birth/Sex: [birth year] (65 y.o. M) Treating RN: Cornell Barman Primary Care Xzander Gilham: [name] Other Clinician: Massie Kluver Referring Hadlie Gipson: [name] Treating Nioka Thorington/Extender: Yaakov Guthrie in Treatment: 35 Electronic Signature(s) Signed: 07/13/2022 5:04:43 PM By: Massie Kluver Signed: 07/14/2022 11:19:49 AM By: Gretta Cool, BSN, RN, CWS, Kim RN, BSN Entered By: Massie Kluver on 07/13/2022 08:24:05 [name] ([id number]) -------------------------------------------------------------------------------- Multi Wound Chart  Details Patient Name: [name] Date of Service: 07/13/2022 8:15 AM Medical Record Number: [id number] Patient Account Number: [id number] Date of Birth/Sex: 11-28-1955 (65 y.o. M) Treating RN: Cornell Barman Primary Care Josef Tourigny: [name] Other Clinician: Massie Kluver Referring Grayden Burley: [name] Treating Clova Morlock/Extender: Yaakov Guthrie in Treatment: 35 Vital Signs Height(in): 74 Pulse(bpm): 69 Weight(lbs): 226 Blood Pressure(mmHg): 148/86 Body Mass Index(BMI): 29 Temperature(F): 97.6 Respiratory Rate(breaths/min): 18 Photos: [N/A:N/A] Wound Location: Right, Plantar Foot N/A N/A Wounding Event: Blister N/A N/A Primary Etiology: Diabetic Wound/Ulcer of the Lower N/A N/A Extremity Comorbid History: Type II Diabetes, Osteoarthritis N/A N/A Date Acquired: 07/15/2020 N/A N/A Weeks of Treatment: 35 N/A N/A Wound Status: Open N/A N/A Wound Recurrence: No N/A N/A Measurements L x W x D (cm) 0.9x0.6x0.3 N/A N/A Area (cm) : 0.424 N/A N/A Volume (cm) : 0.127 N/A N/A % Reduction in Area: 62.20% N/A N/A % Reduction in Volume: 81.20% N/A N/A Classification: Grade 2 N/A N/A Exudate Amount: Medium N/A N/A Exudate Type: Serosanguineous N/A N/A Exudate Color: red, brown N/A N/A Wound Margin: Thickened N/A N/A Granulation Amount: Large (67-100%) N/A N/A Granulation Quality: Red N/A N/A Necrotic Amount: Small (1-33%) N/A N/A Exposed Structures: Fat Layer (Subcutaneous Tissue): N/A N/A Yes Fascia: No Tendon: No Muscle: No Joint: No Bone: No Epithelialization: None N/A N/A Treatment Notes Electronic Signature(s) Signed: 07/13/2022 5:04:43 PM By: Massie Kluver Entered By: Massie Kluver on 07/13/2022 08:24:17 [name] ([id number]) -------------------------------------------------------------------------------- [place] Details Patient Name: [name] Date of Service: 07/13/2022 8:15 AM Medical Record Number:  [id number] Patient Account Number: [id number] Date of Birth/Sex: 1955-12-19 (65 y.o. M) Treating RN: Cornell Barman Primary Care Aeliana Spates: [name] Other Clinician: Massie Kluver Referring Romilda Proby: [name] Treating Analys Ryden/Extender: Yaakov Guthrie in Treatment: 56 Active Inactive Wound/Skin Impairment Nursing Diagnoses: Impaired tissue integrity Knowledge deficit related to smoking impact on wound healing Knowledge deficit related to ulceration/compromised skin integrity Goals: Patient/caregiver will verbalize understanding of skin care regimen Date Initiated: 11/10/2021 Date Inactivated: 12/01/2021 Target Resolution Date: 11/17/2021 Goal Status: Met Ulcer/skin breakdown will have a volume reduction of 30% by week 4 Date Initiated: 11/10/2021 Date Inactivated: 02/02/2022 Target Resolution Date: 12/08/2021 Goal Status: Met Ulcer/skin breakdown will have a volume reduction of 50% by week 8 Date Initiated: 11/10/2021 Target Resolution Date: 01/05/2022 Goal Status: Active Ulcer/skin breakdown will have a volume reduction of 80%  by week 12 Date Initiated: 11/10/2021 Target Resolution Date: 02/02/2022 Goal Status: Active Ulcer/skin breakdown will heal within 14 weeks Date Initiated: 11/10/2021 Target Resolution Date: 02/16/2022 Goal Status: Active Interventions: Assess patient/caregiver ability to obtain necessary supplies Assess patient/caregiver ability to perform ulcer/skin care regimen upon admission and as needed Assess ulceration(s) every visit Notes: Electronic Signature(s) Signed: 07/13/2022 5:04:43 PM By: Massie Kluver Signed: 07/14/2022 11:19:49 AM By: Gretta Cool, BSN, RN, CWS, Kim RN, BSN Entered By: Massie Kluver on 07/13/2022 08:24:10 [name] ([id number]) -------------------------------------------------------------------------------- Pain Assessment Details Patient Name: [name] Date of Service: 07/13/2022 8:15 AM Medical Record Number:  [id number] Patient Account Number: [id number] Date of Birth/Sex: 01/07/1956 (65 y.o. M) Treating RN: Cornell Barman Primary Care Tyann Niehaus: [name] Other Clinician: Massie Kluver Referring Kiandra Sanguinetti: [name] Treating Haizley Cannella/Extender: Yaakov Guthrie in Treatment: 84 Active Problems Location of Pain Severity and Description of Pain Patient Has Paino Yes Site Locations Pain Location: Generalized Pain Duration of the Pain. Constant / Intermittento Constant Rate the pain. Current Pain Level: 6 Character of Pain Describe the Pain: Throbbing Pain Management and Medication Current Pain Management: Medication: Yes Rest: Yes Electronic Signature(s) Signed: 07/13/2022 5:04:43 PM By: Massie Kluver Signed: 07/14/2022 11:19:49 AM By: Gretta Cool, BSN, RN, CWS, Kim RN, BSN Entered By: Massie Kluver on 07/13/2022 08:17:13 [name] ([id number]) -------------------------------------------------------------------------------- Patient/Caregiver Education Details Patient Name: [name] Date of Service: 07/13/2022 8:15 AM Medical Record Number: [id number] Patient Account Number: [id number] Date of Birth/Gender: 01-27-1956 (65 y.o. M) Treating RN: Cornell Barman Primary Care Physician: [name] Other Clinician: Massie Kluver Referring Physician: [name] Treating Physician/Extender: Yaakov Guthrie in Treatment: 53 Education Assessment Education Provided To: Patient Education Topics Provided Wound/Skin Impairment: Handouts: Other: continue wound care as directed Methods: Explain/Verbal Responses: State content correctly Electronic Signature(s) Signed: 07/13/2022 5:04:43 PM By: Massie Kluver Entered By: Massie Kluver on 07/13/2022 08:46:42 [name] ([id number]) -------------------------------------------------------------------------------- Wound Assessment Details Patient Name: [name] Date of Service: 07/13/2022 8:15 AM Medical  Record Number: [id number] Patient Account Number: [id number] Date of Birth/Sex: 1956-02-05 (65 y.o. M) Treating RN: Cornell Barman Primary Care Xavier Munger: [name] Other Clinician: Massie Kluver Referring Azalee Weimer: [name] Treating Kiano Terrien/Extender: Yaakov Guthrie in Treatment: 35 Wound Status Wound Number: 1 Primary Etiology: Diabetic Wound/Ulcer of the Lower Extremity Wound Location: Right, Plantar Foot Wound Status: Open Wounding Event: Blister Comorbid History: Type II Diabetes, Osteoarthritis Date Acquired: 07/15/2020 Weeks Of Treatment: 35 Clustered Wound: No Photos Wound Measurements Length: (cm) 0.9 Width: (cm) 0.6 Depth: (cm) 0.3 Area: (cm) 0.424 Volume: (cm) 0.127 % Reduction in Area: 62.2% % Reduction in Volume: 81.2% Epithelialization: None Wound Description Classification: Grade 2 Wound Margin: Thickened Exudate Amount: Medium Exudate Type: Serosanguineous Exudate Color: red, brown Foul Odor After Cleansing: No Slough/Fibrino Yes Wound Bed Granulation Amount: Large (67-100%) Exposed Structure Granulation Quality: Red Fascia Exposed: No Necrotic Amount: Small (1-33%) Fat Layer (Subcutaneous Tissue) Exposed: Yes Necrotic Quality: Adherent Slough Tendon Exposed: No Muscle Exposed: No Joint Exposed: No Bone Exposed: No Treatment Notes Wound #1 (Foot) Wound Laterality: Plantar, Right Cleanser Wound Cleanser Discharge Instruction: Wash your hands with soap and water. Remove old dressing, discard into plastic bag and place into trash. Cleanse the wound with Wound Cleanser prior to applying a clean dressing using gauze sponges, not tissues or cotton balls. Do not scrub or use excessive force. Pat dry using gauze sponges, not tissue or cotton balls. RUDDY, SWIRE ([id number]) Peri-Wound Care Topical keystone Discharge Instruction: Patient did not bring to clinic. Will do at  home later tonight. Primary Dressing Hydrofera Blue Ready  Transfer Foam, 2.5x2.5 (in/in) Discharge Instruction: Apply Hydrofera Blue Ready to wound bed as directed Secondary Dressing Zetuvit Plus 4x4 (in/in) Secured With Coban Cohesive Bandage 4x5 (yds) Stretched Discharge Instruction: Apply Coban as directed. Compression Wrap Compression Stockings Add-Ons Electronic Signature(s) Signed: 07/13/2022 5:04:43 PM By: Massie Kluver Signed: 07/14/2022 11:19:49 AM By: Gretta Cool, BSN, RN, CWS, Kim RN, BSN Entered By: Massie Kluver on 07/13/2022 08:23:53 [name] ([id number]) -------------------------------------------------------------------------------- Vitals Details Patient Name: [name] Date of Service: 07/13/2022 8:15 AM Medical Record Number: [id number] Patient Account Number: [id number] Date of Birth/Sex: November 06, 1955 (65 y.o. M) Treating RN: Cornell Barman Primary Care Korban Shearer: [name] Other Clinician: Massie Kluver Referring Jerilee Space: [name] Treating Avery Eustice/Extender: Yaakov Guthrie in Treatment: 35 Vital Signs Time Taken: 08:14 Temperature (F): 97.6 Height (in): 74 Pulse (bpm): 69 Weight (lbs): 226 Respiratory Rate (breaths/min): 18 Body Mass Index (BMI): 29 Blood Pressure (mmHg): 148/86 Reference Range: 80 - 120 mg / dl Electronic Signature(s) Signed: 07/13/2022 5:04:43 PM By: Massie Kluver Entered By: Massie Kluver on 07/13/2022 08:17:06

## 2022-07-16 ENCOUNTER — Telehealth: Payer: Self-pay | Admitting: *Deleted

## 2022-07-16 NOTE — Telephone Encounter (Addendum)
I checked [employer] to check on the status of Mr. Laboy's shoes.  [employer] sent another request to Dr. Netty Starring requesting a signature.  They have attempted several times to get the authorization.  I called Mr. Ells to inform him.  He stated he receive a message from Dr. Posey Pronto yesterday stating the matter had been settled.  I informed him that Dr. Netty Starring had not responded to the request for authorization.  I asked him if Dr. Netty Starring treats him for his Diabetes.  He stated that Dr. Netty Starring does not.  He said it's a lady.  He said he would follow-up and will call back with her name because he couldn't remember it.  He said he would call on possibly Friday.  I gave him the casting number, [phone], to call once he gets updated information.  (It appears he saw [name], NP on 06/06/2022 for a Diabetic check.  She mentioned Diabetic shoes in her note.  Documentation may need to be sent to her.)

## 2022-07-20 ENCOUNTER — Encounter (HOSPITAL_BASED_OUTPATIENT_CLINIC_OR_DEPARTMENT_OTHER): Payer: Medicare Other | Admitting: Internal Medicine

## 2022-07-20 DIAGNOSIS — E11621 Type 2 diabetes mellitus with foot ulcer: Secondary | ICD-10-CM | POA: Diagnosis not present

## 2022-07-20 DIAGNOSIS — L97512 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ed: Secondary | ICD-10-CM | POA: Diagnosis not present

## 2022-07-22 NOTE — Progress Notes (Signed)
[name], [name] ([id number]) Visit Report for 07/20/2022 Arrival Information Details Patient Name: [name], [name] Date of Service: 07/20/2022 8:15 AM Medical Record Number: [id number] Patient Account Number: [id number] Date of Birth/Sex: February 02, 1956 (66 y.o. M) Treating RN: Carlene Coria Primary Care Isahia Hollerbach: [name] Other Clinician: Massie Kluver Referring Merion Grimaldo: [name] Treating Daryana Whirley/Extender: Yaakov Guthrie in Treatment: 78 Visit Information History Since Last Visit All ordered tests and consults were completed: No Patient Arrived: Ambulatory Added or deleted any medications: No Arrival Time: 08:05 Any new allergies or adverse reactions: No Transfer Assistance: None Had a fall or experienced change in No Patient Requires Transmission-Based No activities of daily living that may affect Precautions: risk of falls: Patient Has Alerts: Yes Hospitalized since last visit: No Patient Alerts: Patient on Blood Pain Present Now: Yes Thinner DIABETIC Plavix Electronic Signature(s) Signed: 07/21/2022 11:41:41 AM By: Massie Kluver Entered By: Massie Kluver on 07/20/2022 08:08:32 [name] ([id number]) -------------------------------------------------------------------------------- Encounter Discharge Information Details Patient Name: [name] Date of Service: 07/20/2022 8:15 AM Medical Record Number: [id number] Patient Account Number: [id number] Date of Birth/Sex: 03-31-1956 (65 y.o. M) Treating RN: Carlene Coria Primary Care [place] Viereck: [name] Other Clinician: Massie Kluver Referring Ocean Schildt: [name] Treating Loghan Subia/Extender: Yaakov Guthrie in Treatment: 85 Encounter Discharge Information Items Post Procedure Vitals Discharge Condition: Stable Temperature (F): 98.1 Ambulatory Status: Ambulatory Pulse (bpm): 75 Discharge Destination: Home Respiratory Rate (breaths/min): 18 Transportation: Private Auto Blood  Pressure (mmHg): 137/86 Accompanied By: self Schedule Follow-up Appointment: No Clinical Summary of Care: Electronic Signature(s) Signed: 07/21/2022 11:41:41 AM By: Massie Kluver Entered By: Massie Kluver on 07/20/2022 09:01:59 [name] ([id number]) -------------------------------------------------------------------------------- Lower Extremity Assessment Details Patient Name: [name] Date of Service: 07/20/2022 8:15 AM Medical Record Number: [id number] Patient Account Number: [id number] Date of Birth/Sex: September 11, 1956 (65 y.o. M) Treating RN: Carlene Coria Primary Care Marshelle Bilger: [name] Other Clinician: Massie Kluver Referring Avishai Reihl: [name] Treating Tran Randle/Extender: Yaakov Guthrie in Treatment: 63 Electronic Signature(s) Signed: 07/21/2022 11:41:41 AM By: Massie Kluver Signed: 07/22/2022 9:44:45 AM By: Carlene Coria RN Entered By: Massie Kluver on 07/20/2022 08:17:53 [name] ([id number]) -------------------------------------------------------------------------------- Multi Wound Chart Details Patient Name: [name] Date of Service: 07/20/2022 8:15 AM Medical Record Number: [id number] Patient Account Number: [id number] Date of Birth/Sex: [birth year] (65 y.o. M) Treating RN: Carlene Coria Primary Care Chanze Teagle: [name] Other Clinician: Massie Kluver Referring Tayvon Culley: [name] Treating Lavilla Delamora/Extender: Yaakov Guthrie in Treatment: 41 Vital Signs Height(in): 74 Pulse(bpm): 75 Weight(lbs): 226 Blood Pressure(mmHg): 137/86 Body Mass Index(BMI): 29 Temperature(F): 98.1 Respiratory Rate(breaths/min): 18 Photos: [N/A:N/A] Wound Location: Right, Plantar Foot N/A N/A Wounding Event: Blister N/A N/A Primary Etiology: Diabetic Wound/Ulcer of the Lower N/A N/A Extremity Comorbid History: Type II Diabetes, Osteoarthritis N/A N/A Date Acquired: 07/15/2020 N/A N/A Weeks of Treatment: 36 N/A N/A Wound Status:  Open N/A N/A Wound Recurrence: No N/A N/A Measurements L x W x D (cm) 0.7x0.5x0.2 N/A N/A Area (cm) : 0.275 N/A N/A Volume (cm) : 0.055 N/A N/A % Reduction in Area: 75.50% N/A N/A % Reduction in Volume: 91.80% N/A N/A Classification: Grade 2 N/A N/A Exudate Amount: Medium N/A N/A Exudate Type: Serosanguineous N/A N/A Exudate Color: red, brown N/A N/A Wound Margin: Thickened N/A N/A Granulation Amount: Large (67-100%) N/A N/A Granulation Quality: Red N/A N/A Necrotic Amount: Small (1-33%) N/A N/A Exposed Structures: Fat Layer (Subcutaneous Tissue): N/A N/A Yes Fascia: No Tendon: No Muscle: No Joint: No Bone: No Epithelialization: None N/A N/A Treatment Notes Electronic Signature(s) Signed: 07/21/2022 11:41:41 AM By:  Massie Kluver Entered By: Massie Kluver on 07/20/2022 08:31:43 [name] ([id number]) -------------------------------------------------------------------------------- Multi-Disciplinary Care Plan Details Patient Name: [name], [name] Date of Service: 07/20/2022 8:15 AM Medical Record Number: [id number] Patient Account Number: [id number] Date of Birth/Sex: 31-May-1956 (65 y.o. M) Treating RN: Carlene Coria Primary Care : [name] Other Clinician: Massie Kluver Referring : [name] Treating /Extender: Yaakov Guthrie in Treatment: 83 Active Inactive Wound/Skin Impairment Nursing Diagnoses: Impaired tissue integrity Knowledge deficit related to smoking impact on wound healing Knowledge deficit related to ulceration/compromised skin integrity Goals: Patient/caregiver will verbalize understanding of skin care regimen Date Initiated: 11/10/2021 Date Inactivated: 12/01/2021 Target Resolution Date: 11/17/2021 Goal Status: Met Ulcer/skin breakdown will have a volume reduction of 30% by week 4 Date Initiated: 11/10/2021 Date Inactivated: 02/02/2022 Target Resolution Date: 12/08/2021 Goal Status: Met Ulcer/skin breakdown  will have a volume reduction of 50% by week 8 Date Initiated: 11/10/2021 Target Resolution Date: 01/05/2022 Goal Status: Active Ulcer/skin breakdown will have a volume reduction of 80% by week 12 Date Initiated: 11/10/2021 Target Resolution Date: 02/02/2022 Goal Status: Active Ulcer/skin breakdown will heal within 14 weeks Date Initiated: 11/10/2021 Target Resolution Date: 02/16/2022 Goal Status: Active Interventions: Assess patient/caregiver ability to obtain necessary supplies Assess patient/caregiver ability to perform ulcer/skin care regimen upon admission and as needed Assess ulceration(s) every visit Notes: Electronic Signature(s) Signed: 07/21/2022 11:41:41 AM By: Massie Kluver Signed: 07/22/2022 9:44:45 AM By: Carlene Coria RN Entered By: Massie Kluver on 07/20/2022 08:17:57 [name] ([id number]) -------------------------------------------------------------------------------- Pain Assessment Details Patient Name: [name] Date of Service: 07/20/2022 8:15 AM Medical Record Number: [id number] Patient Account Number: [id number] Date of Birth/Sex: 1956-09-18 (65 y.o. M) Treating RN: Carlene Coria Primary Care : [name] Other Clinician: Massie Kluver Referring : [name] Treating /Extender: Yaakov Guthrie in Treatment: 8 Active Problems Location of Pain Severity and Description of Pain Patient Has Paino Yes Site Locations Pain Location: Generalized Pain, Pain in Ulcers Duration of the Pain. Constant / Intermittento Constant Rate the pain. Current Pain Level: 7 Character of Pain Describe the Pain: Burning, Throbbing Pain Management and Medication Current Pain Management: Medication: Yes Rest: Yes Electronic Signature(s) Signed: 07/21/2022 11:41:41 AM By: Massie Kluver Signed: 07/22/2022 9:44:45 AM By: Carlene Coria RN Entered By: Massie Kluver on 07/20/2022 08:12:59 [name]  ([id number]) -------------------------------------------------------------------------------- Patient/Caregiver Education Details Patient Name: [name] Date of Service: 07/20/2022 8:15 AM Medical Record Number: [id number] Patient Account Number: [id number] Date of Birth/Gender: [birth year] (65 y.o. M) Treating RN: Carlene Coria Primary Care Physician: [name] Other Clinician: Massie Kluver Referring Physician: [name] Treating Physician/Extender: Yaakov Guthrie in Treatment: 52 Education Assessment Education Provided To: Patient Education Topics Provided Wound/Skin Impairment: Handouts: Other: continue wound care as directed Methods: Explain/Verbal Responses: State content correctly Electronic Signature(s) Signed: 07/21/2022 11:41:41 AM By: Massie Kluver Entered By: Massie Kluver on 07/20/2022 08:55:33 [name] ([id number]) -------------------------------------------------------------------------------- Wound Assessment Details Patient Name: [name] Date of Service: 07/20/2022 8:15 AM Medical Record Number: [id number] Patient Account Number: [id number] Date of Birth/Sex: December 11, 1955 (65 y.o. M) Treating RN: Carlene Coria Primary Care : [name] Other Clinician: Massie Kluver Referring : [name] Treating /Extender: Yaakov Guthrie in Treatment: 36 Wound Status Wound Number: 1 Primary Etiology: Diabetic Wound/Ulcer of the Lower Extremity Wound Location: Right, Plantar Foot Wound Status: Open Wounding Event: Blister Comorbid History: Type II Diabetes, Osteoarthritis Date Acquired: 07/15/2020 Weeks Of Treatment: 36 Clustered Wound: No Photos Wound Measurements Length: (cm) 0.7 Width: (cm) 0.5 Depth: (cm) 0.2 Area: (cm) 0.275 Volume: (  cm) 0.055 % Reduction in Area: 75.5% % Reduction in Volume: 91.8% Epithelialization: None Wound Description Classification: Grade 2 Wound Margin:  Thickened Exudate Amount: Medium Exudate Type: Serosanguineous Exudate Color: red, brown Foul Odor After Cleansing: No Slough/Fibrino Yes Wound Bed Granulation Amount: Large (67-100%) Exposed Structure Granulation Quality: Red Fascia Exposed: No Necrotic Amount: Small (1-33%) Fat Layer (Subcutaneous Tissue) Exposed: Yes Necrotic Quality: Adherent Slough Tendon Exposed: No Muscle Exposed: No Joint Exposed: No Bone Exposed: No Treatment Notes Wound #1 (Foot) Wound Laterality: Plantar, Right Cleanser Wound Cleanser Discharge Instruction: Wash your hands with soap and water. Remove old dressing, discard into plastic bag and place into trash. Cleanse the wound with Wound Cleanser prior to applying a clean dressing using gauze sponges, not tissues or cotton balls. Do not scrub or use excessive force. Pat dry using gauze sponges, not tissue or cotton balls. [name], [name] ([id number]) Peri-Wound Care Topical Primary Dressing PuraPly Secondary Dressing Zetuvit Plus 4x4 (in/in) Secured With Coban Cohesive Bandage 4x5 (yds) Stretched Discharge Instruction: Apply Coban as directed. Compression Wrap Compression Stockings Add-Ons Electronic Signature(s) Signed: 07/21/2022 11:41:41 AM By: Massie Kluver Signed: 07/22/2022 9:44:45 AM By: Carlene Coria RN Entered By: Massie Kluver on 07/20/2022 08:17:38 [name] ([id number]) -------------------------------------------------------------------------------- Vitals Details Patient Name: [name] Date of Service: 07/20/2022 8:15 AM Medical Record Number: [id number] Patient Account Number: [id number] Date of Birth/Sex: September 15, 1956 (65 y.o. M) Treating RN: Carlene Coria Primary Care Jaeson Molstad: [name] Other Clinician: Massie Kluver Referring Seraj Dunnam: [name] Treating Zael Shuman/Extender: Yaakov Guthrie in Treatment: 38 Vital Signs Time Taken: 08:09 Temperature (F): 98.1 Height (in): 74 Pulse (bpm):  75 Weight (lbs): 226 Respiratory Rate (breaths/min): 18 Body Mass Index (BMI): 29 Blood Pressure (mmHg): 137/86 Reference Range: 80 - 120 mg / dl Electronic Signature(s) Signed: 07/21/2022 11:41:41 AM By: Massie Kluver Entered By: Massie Kluver on 07/20/2022 08:12:34

## 2022-07-22 NOTE — Progress Notes (Addendum)
[name] ([id number]) [id number] Page 1 of 11 Visit Report for 07/20/2022 Chief Complaint Document Details Patient Name: Date of Service: [name], [name] [name] 07/20/2022 8:15 A M Medical Record Number: [id number] Patient Account Number: [id number] Date of Birth/Sex: Treating [name]: 1956-05-16 (65 y.o. [name]) Yevonne Pax Primary Care Provider: Manson Allan Other Clinician: Betha [name] Referring Provider: Treating Provider/Extender: Collins Scotland Weeks in Treatment: 41 Information Obtained from: Patient Chief Complaint Right plantar foot wound Electronic Signature(s) Signed: 07/20/2022 9:02:46 AM By: Geralyn Corwin DO Entered By: Geralyn Corwin on 07/20/2022 08:51:51 -------------------------------------------------------------------------------- Cellular or Tissue Based Product Details Patient Name: Date of Service: [name], [name] 07/20/2022 8:15 A M Medical Record Number: [id number] Patient Account Number: [id number] Date of Birth/Sex: Treating [name]: Apr 01, 1956 (66 y.o. [name] Primary Care Provider: Manson Allan Other Clinician: Betha [name] Referring Provider: Treating Provider/Extender: Collins Scotland Weeks in Treatment: 36 Cellular or Tissue Based Product Type Wound #1 Right,Plantar Foot Applied to: Performed By: Physician Geralyn Corwin, MD Cellular or Tissue Based Product Type: Puraply Level of Consciousness (Pre-procedure): Awake and Alert Pre-procedure Verification/Time Out Yes - 08:41 Taken: Location: genitalia / hands / feet / multiple digits Wound Size (sq cm): 0.35 Product Size (sq cm): 4 Waste Size (sq cm): 3.5 Waste Reason: wound size Amount of Product Applied (sq cm): 0.5 Instrument Used: Scissors Lot #: [id number] Order #: 1 Expiration Date: 10/29/2023 Fenestrated: No [name] ([id number]) [id number] Page 2 of 11 Reconstituted: Yes Solution  Type: normal saline Solution Amount: Lot #: [id number] Solution Expiration Date: 01/18/2024 Secured: Yes Secured With: Steri-Strips Dressing Applied: Yes Primary Dressing: bolster Procedural Pain: 1 Post Procedural Pain: 1 Response to Treatment: Procedure was tolerated well Level of Consciousness (Post- Awake and Alert procedure): Post Procedure Diagnosis Same as Pre-procedure Electronic Signature(s) Signed: 08/15/2022 6:25:24 AM By: [name], BSN, [name], CWS, [name], BSN Previous Signature: 07/21/2022 11:41:41 AM Version By: Betha [name] Entered By: [name], BSN, [name], CWS, [name] on 08/15/2022 09:25:24 -------------------------------------------------------------------------------- Debridement Details Patient Name: Date of Service: [name] 07/20/2022 8:15 A M Medical Record Number: [id number] Patient Account Number: [id number] Date of Birth/Sex: Treating [name]: [birth year] (65 y.o. [name]) [name], Porter Primary Care Provider: Manson Allan Other Clinician: Betha [name] Referring Provider: Treating Provider/Extender: Collins Scotland Weeks in Treatment: 36 Debridement Performed for Assessment: Wound #1 Right,Plantar Foot Performed By: Physician Geralyn Corwin, MD Debridement Type: Debridement Severity of Tissue Pre Debridement: Fat layer exposed Level of Consciousness (Pre-procedure): Awake and Alert Pre-procedure Verification/Time Out Yes - 08:33 Taken: Start Time: 08:33 T Area Debrided (L x W): otal 1 (cm) x 1 (cm) = 1 (cm) Tissue and other material debrided: Viable, Non-Viable, Callus, Slough, Subcutaneous, Slough Level: Skin/Subcutaneous Tissue Debridement Description: Excisional Instrument: Curette Bleeding: Minimum Hemostasis Achieved: Pressure End Time: 08:37 Response to Treatment: Procedure was tolerated well Level of Consciousness (Post- Awake and Alert procedure): Post Debridement Measurements of Total Wound Length: (cm) 0.7 Width: (cm) 0.5 Depth:  (cm) 0.3 Volume: (cm) 0.082 Character of Wound/Ulcer Post Debridement: Stable Severity of Tissue Post Debridement: Fat layer exposed Post Procedure Diagnosis Same as [name], [name] ([id number]) [id number] Page 3 of 11 Electronic Signature(s) Signed: 07/20/2022 9:02:46 AM By: Geralyn Corwin DO Signed: 07/21/2022 11:41:41 AM By: Betha [name] Signed: 07/22/2022 9:44:45 AM By: Yevonne Pax [name] Entered By: Betha [name] on 07/20/2022 08:35:18 -------------------------------------------------------------------------------- HPI Details Patient Name: Date of Service: [name] 07/20/2022 8:15 A M Medical Record Number: [id number] Patient Account Number: [id number] Date of Birth/Sex: Treating [name]:  19-Oct-1956 (65 y.o. [name] Primary Care Provider: Manson Allan Other Clinician: Betha [name] Referring Provider: Treating Provider/Extender: Collins Scotland Weeks in Treatment: 69 History of Present Illness HPI Description: Admission 11/10/2021 [name] is a 66 year old male with a past medical history of uncontrolled type 2 diabetes with last hemoglobin A1c of 8.1 complicated by peripheral neuropathy that presents to the clinic for a 2-year history of nonhealing ulcer to the bottom of his right foot. He states this started out as a blister caused by a work boot. He reports receiving wound care when he resided in [place]. He is reestablishing his wound care in [name] today. He is currently keeping the area clean and covered. He has insoles designed to help offload the wound bed. He currently denies signs of infection. 1/18; patient presents for follow-up. He has been using Hydrofera Blue to the wound bed. He has no issues or complaints today. He has not received the defender.[name] He denies signs of infection. 2/1; patient presents for follow-up. He has been using Hydrofera Blue to the wound bed he  states he received the defender boot and has been using it however he did not have it on today. He currently denies signs of infection to the right foot. Unfortunately he developed a wound to the left great toe over the past week. He states he received new orthotics and these caused a blister to the left great toe which turned into a wound. He has not been doing anything to the wound bed. He currently denies systemic signs of infection. 2/8; Patient presents for follow-up. 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 He states he is about 90% recovered. He has some mild weakness to the left side. He reports taking the antibiotics prescribed at last clinic visit. He reports using Hydrofera Blue for dressing changes. He only uses the offloading boot when he is at home. He uses open toed shoes to the right foot. He currently denies signs of infection. 2/15; patient presents for follow-up. He states he is still taking the antibiotics prescribed. He reports using the defender boot to the right foot and open toed shoe to the left foot however he is not wearing either today. He currently denies systemic signs of infection. He stated he cut down a tree last week and was outside doing yard work. 2/22; patient presents for follow-up. 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 He reports feeling well. He reports using the defender boot to the right leg and continues to use Hydrofera Blue with dressing changes. He denies systemic signs of infection. 3/8; patient presents for follow-up. He sees Dr. Alberteen Spindle tomorrow for follow-up of the left great toe amputation. This was a partial amputation. He reports he is taking Augmentin currently. He reports increased erythema to the left great toe amputation site. He also reports a decline in his right plantar foot wound. He reports  it is draining more. He denies purulent drainage. 3/15; patient presents for follow-up. He did not see Dr. Alberteen Spindle last week but states he sees him today. He states he has been taking doxycycline in the past week and started gentamicin ointment. He continues to use [facility] with dressing changes. He currently denies systemic signs of infection. 3/22; patient presents for follow-up. Patient had partial amputation of the left great toe on  2/15. 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 Alberteen Spindle. He reports no issues and has no complaints. He is currently taking Keflex and doxycycline prescribed by Dr. Alberteen Spindle. He continues to use gentamicin ointment and Hydrofera Blue to the right foot wound. He reports using the defender boot when he is at home. 3/29; patient presents for follow-up. He continues to be on antibiotics per podiatry. He has been using Hydrofera Blue and gentamicin ointment to the right plantar foot wound. He has no issues or complaints today. He denies signs of infection. 4/5; patient presents for follow-up. He has been using Dakin's wet-to-dry dressings with improvement to wound healing. He denies signs of infection. She has no issues or complaints today. 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 Use Hydrofera Blue for a prolonged period of time and has been using Dakin's wet-to-dry I think for the last 2 to 3 weeks. He has a Psychologist, forensic at home but he does not wear that coming into the clinic because he drives. He tells me he has been compliant with the defender boot short of when he goes out to drive. He lives alone. He also tells me he has had a previous left great toe amputation We are following him for the right foot wound. 4/26; patient presents for follow-up. He had worsening of his left foot wound and had [name] debridement on 4/16 by Dr. Fanny Skates. He is being followed by podiatry for this issue. He he is on IV Unasyn based  on tissue culture by ID.[name] We are following him for the right foot wound. He has been using Dakin's wet-to- dry dressings without issues. He reports continued usage of the defender boot. [name], [name] ([id number]) [id number] Page 4 of 11 5/3; patient presents for follow-up. He has been using Dakin's wet-to-dry dressings to the right foot wound. He has no issues or complaints today. He reports using the Psychologist, forensic. He reports offloading the foot wound when he is at home by sitting in a recliner. He denies signs of infection. He is still getting IV antibiotics For the left foot wound that is being followed by podiatry. He states the end date is later this month on 5/28. 5/17; patient presents for follow-up. He missed his last clinic appointment. He has been using Dakin's wet-to-dry dressings. He states he is offloading the right foot wound with the defender boot although he does not have this today. He is still on IV antibiotics. He has no issues or complaints today. 5/24; patient presents for follow-up. He has been using Hydrofera Blue to the wound bed without issues. He denies signs of infection. At last clinic visit he had a wound culture done that detected high levels of Enterococcus faecalis and low levels of Corynebacterium stratum and Staphylococcus epidermidis. Keystone antibiotics were ordered. He has not heard from the company yet. 5/31; patient presents for follow-up. He continues to use Hydrofera Blue to the wound bed. He denies signs of infection. Keystone antibiotics were ordered at last clinic visit and he states he called the company to pay for it. He states that it is being shipped. We rediscussed the total contact cast and he declines having this placed today. He states he is wearing the defender boot however he never wears it into the office. 04-06-2022 upon evaluation today patient appears to be doing about the same in regard to his wound. This is not  significantly smaller. He is not wearing his offloading boot.  We discussed that today he tells me that he wears it "all the time as he chuckled and does not have it on during the office visit today. I think it is increasingly obvious that he is just messing around when he says this based on what I am seeing and otherwise I do not know why he would wear it all the time but not to the clinic. Either way my opinion based on what I am seeing currently is simply that the wound does seem to be still having quite a bit of callus buildup around the edges it is also very dry. He does tell me that he has been using the [name] since he got it and to be honest I think this is good and should hopefully help keep things under control but at the same time I do not see any evidence of active infection. Again the Jodie Echevaria is a topical antibiotic with multiple compounds to help prevent further infection and worsening overall. 6/14; patient presents for follow-up. He has been using Keystone antibiotics with [employer]	[place] daily. He states he is wearing his offloading boot. He does not have this in office today. He is going to the beach for the next 5 days. He states he Will not be using using any offloading device During this time. I again offered the total contact cast however he declined. I did ask him to reconsider for next clinic visit. He currently denies signs of infection. 6/21; patient presents for follow-up. He went to the beach last week. He did not use an offloading device. Wound is slightly deeper today. I again offered the total contact cast but he declined. Currently denies signs of infection. He reports using the [place]iley, hydrofera blue and the Psychologist, forensic. 6/28; patient presents for follow-up. Patient declines total contact cast today. He has been using Keystone and [facility] to the wound bed. It is unclear if he is using the Psychologist, forensic. He would like a letter for the post man to delivers mail  to his front door so he does not have to walk down the driveway. 7/5; patient presents for follow-up. He states he is considering the total contact cast however does not want this placed today. He is actually going to the gym after this appointment. He does not want offloading foot wear for this. He has been using Keystone and [facility] to the wound bed. He denies signs of infection. 7/12; patient presents for follow-up. He is still considering the total contact cast however does not want this today. He has been using Keystone antibiotic and Hydrofera Blue to the wound bed. He denies signs of infection. He has no issues or complaints today. 7/26; patient presents for follow-up. He declines a total contact cast. He has been using his an antibiotic and Hydrofera Blue to the wound bed. He denies systemic signs of infection. He is scheduled to discuss orthotics next week with his podiatrist. 8/2; patient presents for follow-up. He has been using Hydrofera Blue to the wound bed. He has no issues or complaints today. He declines a total contact cast. He states he is getting orthotics today. 8/9; patient presents for follow-up. He has been using Hydrofera Blue to the wound bed. He again declines the total contact cast. He does state that he is considering starting it at the end of the month. He has a vacation coming up and does not want to have it in place for that. 8/16; patient presents for follow-up. He has  been using Hydrofera Blue and Keystone to the wound bed. He started taking Augmentin and doxycycline as prescribed. He denies signs of infection. He is going on his vacation next week and will be back in 2 weeks. He states he is considering the total contact cast for when he comes back from his trip. 8/30; patient present for follow-up. He has been using Hydrofera Blue and Keystone to the wound bed. He continues to take doxycycline and Augmentin without issues. He went on vacation and reports  walking for long periods of time without pressure relief to the wound bed. He currently denies systemic signs of infection. 9/6; patient presents for follow-up. He has been using Hydrofera Blue and Keystone to the wound bed. He is not wearing his offloading boot. He declines a total contact cast. He has finished Augmentin and doxycycline. He had an MRI completed on 07/01/2022 that did not show an underlying abscess, septic arthritis or osteomyelitis. He currently denies systemic signs of infection. 9/13; patient presents for follow-up. Has been using Hydrofera Blue and Keystone to the wound bed. He does not present with his offloading boot. He declines the total contact cast. He has been approved for PuraPly. We discussed this today and he would like this placed at next clinic visit. He denies signs of infection. 9/20; patient presents for follow-up. We have been using Hydrofera Blue and Keystone antibiotic ointment. Patient denies signs of infection. PuraPly is available for placement and patient would like to proceed with this. Electronic Signature(s) Signed: 07/20/2022 9:02:46 AM By: Geralyn Corwin DO Entered By: Geralyn Corwin on 07/20/2022 08:52:58 [name] ([id number]) [id number] Page 5 of 11 -------------------------------------------------------------------------------- Physical Exam Details Patient Name: Date of Service: [name], [name] 07/20/2022 8:15 A M Medical Record Number: [id number] Patient Account Number: [id number] Date of Birth/Sex: Treating [name]: [birth year] (65 y.o. [name]) Yevonne Pax Primary Care Provider: Manson Allan Other Clinician: Betha [name] Referring Provider: Treating Provider/Extender: Collins Scotland Weeks in Treatment: 70 Constitutional . Cardiovascular . Psychiatric . Notes Right foot: T the plantar aspect of the first metatarsal head there is an open wound with granulation tissue, Nonviable tissue and  callus. There is significant o undermining to the 711 o'clock position. No increased warmth, erythema or purulent drainage noted. Electronic Signature(s) Signed: 07/20/2022 9:02:46 AM By: Geralyn Corwin DO Entered By: Geralyn Corwin on 07/20/2022 08:53:24 -------------------------------------------------------------------------------- Physician Orders Details Patient Name: Date of Service: [name] 07/20/2022 8:15 A M Medical Record Number: [id number] Patient Account Number: [id number] Date of Birth/Sex: Treating [name]: February 27, 1956 (65 y.o. [name] Primary Care Provider: Manson Allan Other Clinician: Betha [name] Referring Provider: Treating Provider/Extender: Collins Scotland Weeks in Treatment: 49 Verbal / Phone Orders: No Diagnosis Coding Follow-up Appointments Return Appointment in 1 week. Nurse Visit as needed Bathing/ Shower/ Hygiene May shower; gently cleanse wound with antibacterial soap, rinse and pat dry prior to dressing wounds No tub bath. Anesthetic (Use 'Patient Medications' Section for Anesthetic Order Entry) Lidocaine applied to wound bed Cellular or Tissue Based Products Cellular or Tissue Based Product Type: - Puraply application #1 applied Edema Control - Lymphedema / Segmental Compressive Device / Other Elevate, Exercise Daily and A void Standing for Long Periods of Time. Elevate leg(s) parallel to the floor when sitting. DO YOUR BEST to sleep in the bed at night. DO NOT sleep in your recliner. Long hours of sitting in a recliner leads to swelling of the legs and/or potential wounds on your backside. Off-Loading Foot Defender - right  foot-keep shin covered-keep pressure off of the wound (Patient is not wear boot to clinic each week. Wearing tennis shoe) Additional Orders / Instructions [name], [name] ([id number]) [id number] Page 6 of 11 Follow Nutritious Diet and Increase Protein Intake - monitor  blood sugar to maintain normal limits Other: - Follow up post surgery for left foot-[facility] handling Medications-Please add to medication list. ntibiotics - Continue keystone and oral antibiotics P.O. A Wound Treatment Wound #1 - Foot Wound Laterality: Plantar, Right Cleanser: Wound Cleanser Every Other Day/30 Days Discharge Instructions: Wash your hands with soap and water. Remove old dressing, discard into plastic bag and place into trash. Cleanse the wound with Wound Cleanser prior to applying a clean dressing using gauze sponges, not tissues or cotton balls. Do not scrub or use excessive force. Pat dry using gauze sponges, not tissue or cotton balls. Prim Dressing: PuraPly ary Every Other Day/30 Days Secondary Dressing: Zetuvit Plus 4x4 (in/in) (Generic) Every Other Day/30 Days Secured With: Coban Cohesive Bandage 4x5 (yds) Stretched Every Other Day/30 Days Discharge Instructions: Apply Coban as directed. Electronic Signature(s) Signed: 07/20/2022 9:02:46 AM By: Geralyn Corwin DO Entered By: Geralyn Corwin on 07/20/2022 08:54:39 -------------------------------------------------------------------------------- Problem List Details Patient Name: Date of Service: [name] 07/20/2022 8:15 A M Medical Record Number: [id number] Patient Account Number: [id number] Date of Birth/Sex: Treating [name]: 12/17/1955 (65 y.o. [name]) Yevonne Pax Primary Care Provider: Manson Allan Other Clinician: Betha [name] Referring Provider: Treating Provider/Extender: Collins Scotland Weeks in Treatment: 53 Active Problems ICD-10 Encounter Code Description Active Date MDM Diagnosis L97.512 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ed 11/10/2021 No Yes E11.621 Type 2 diabetes mellitus with foot ulcer 11/10/2021 No Yes E11.40 Type 2 diabetes mellitus with diabetic neuropathy, unspecified 11/10/2021 No Yes R26.89 Other abnormalities of gait and  mobility 11/10/2021 No Yes Inactive Problems ICD-10 Code Description Active Date Inactive Date [name], [name] ([id number]) [id number] Page 7 of 11 L97.528 Non-pressure chronic ulcer of other part of left foot with other specified severity 12/01/2021 12/01/2021 Resolved Problems Electronic Signature(s) Signed: 07/20/2022 9:02:46 AM By: Geralyn Corwin DO Entered By: Geralyn Corwin on 07/20/2022 08:51:30 -------------------------------------------------------------------------------- Progress Note Details Patient Name: Date of Service: [name] 07/20/2022 8:15 A M Medical Record Number: [id number] Patient Account Number: [id number] Date of Birth/Sex: Treating [name]: April 24, 1956 (65 y.o. [name]) Yevonne Pax Primary Care Provider: Manson Allan Other Clinician: Betha [name] Referring Provider: Treating Provider/Extender: Collins Scotland Weeks in Treatment: 36 Subjective Chief Complaint Information obtained from Patient Right plantar foot wound History of Present Illness (HPI) Admission 11/10/2021 Mr. Byrl Latin is a 66 year old male with a past medical history of uncontrolled type 2 diabetes with last hemoglobin A1c of 8.1 complicated by peripheral neuropathy that presents to the clinic for a 2-year history of nonhealing ulcer to the bottom of his right foot. He states this started out as a blister caused by a work boot. He reports receiving wound care when he resided in [place]. He is reestablishing his wound care in [name] today. He is currently keeping the area clean and covered. He has insoles designed to help offload the wound bed. He currently denies signs of infection. 1/18; patient presents for follow-up. He has been using Hydrofera Blue to the wound bed. He has no issues or complaints today. He has not received the defender.[name] He denies signs of infection. 2/1; patient presents for follow-up. He has been using  Hydrofera Blue to the wound bed he  states he received the defender boot and has been using it however he did not have it on today. He currently denies signs of infection to the right foot. Unfortunately he developed a wound to the left great toe over the past week. He states he received new orthotics and these caused a blister to the left great toe which turned into a wound. He has not been doing anything to the wound bed. He currently denies systemic signs of infection. 2/8; Patient presents for follow-up. 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 He states he is about 90% recovered. He has some mild weakness to the left side. He reports taking the antibiotics prescribed at last clinic visit. He reports using Hydrofera Blue for dressing changes. He only uses the offloading boot when he is at home. He uses open toed shoes to the right foot. He currently denies signs of infection. 2/15; patient presents for follow-up. He states he is still taking the antibiotics prescribed. He reports using the defender boot to the right foot and open toed shoe to the left foot however he is not wearing either today. He currently denies systemic signs of infection. He stated he cut down a tree last week and was outside doing yard work. 2/22; patient presents for follow-up. 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 He reports feeling well. He reports using the defender boot to the right leg and continues to use Hydrofera Blue with dressing changes. He denies systemic signs of infection. 3/8; patient presents for follow-up. He sees Dr. Alberteen Spindleline tomorrow for follow-up of the left great toe amputation. This was a partial amputation. He reports he is taking Augmentin currently. He reports increased erythema to the left great toe amputation site. He also reports a decline in his  right plantar foot wound. He reports it is draining more. He denies purulent drainage. 3/15; patient presents for follow-up. He did not see Dr. Alberteen Spindleline last week but states he sees him today. He states he has been taking doxycycline in the past week and started gentamicin ointment. [facility] dressing [facility]. He currently denies systemic signs of infection. 3/22; patient presents for follow-up. Patient had partial amputation of the left great toe on 2/15. 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 Alberteen Spindleline. He reports no issues and has no complaints. He is currently taking Keflex and doxycycline prescribed by Dr. Alberteen Spindleline. He continues to use gentamicin ointment and Hydrofera Blue to the right foot wound. He reports using the defender boot when he is at home. 3/29; patient presents for follow-up. He continues to be on antibiotics per podiatry. He has been using Hydrofera Blue and gentamicin ointment to the right plantar foot wound. He has no issues or complaints today. He denies signs of infection. 4/5; patient presents for follow-up. He has been using Dakin's wet-to-dry dressings with improvement to wound healing. He denies signs of infection. She has no issues or complaints today. [name] MatinEELY, [name] ([id number]) [id number] Page 8 of 11 4/12; diabetic ulcer on the right plantar first metatarsal head. Use Hydrofera Blue for a prolonged period of time and has been using Dakin's wet-to-dry I think for the last 2 to 3 weeks. He has a Psychologist, forensicdefender boot at home but he does not wear that coming into the clinic because he drives.  He tells me he has been compliant with the defender boot short of when he goes out to drive. He lives alone. He also tells me he has had a previous left great toe amputation We are following him for the right foot wound. 4/26; patient presents for follow-up. He had worsening of his left foot wound and had [name]  debridement on 4/16 by Dr. Fanny Skates. He is being followed by podiatry for this issue. He he is on IV Unasyn based on tissue culture by ID.[name] We are following him for the right foot wound. He has been using Dakin's wet-to- dry dressings without issues. He reports continued usage of the defender boot. 5/3; patient presents for follow-up. He has been using Dakin's wet-to-dry dressings to the right foot wound. He has no issues or complaints today. He reports using the Psychologist, forensic. He reports offloading the foot wound when he is at home by sitting in a recliner. He denies signs of infection. He is still getting IV antibiotics For the left foot wound that is being followed by podiatry. He states the end date is later this month on 5/28. 5/17; patient presents for follow-up. He missed his last clinic appointment. He has been using Dakin's wet-to-dry dressings. He states he is offloading the right foot wound with the defender boot although he does not have this today. He is still on IV antibiotics. He has no issues or complaints today. 5/24; patient presents for follow-up. He has been using Hydrofera Blue to the wound bed without issues. He denies signs of infection. At last clinic visit he had a wound culture done that detected high levels of Enterococcus faecalis and low levels of Corynebacterium stratum and Staphylococcus epidermidis. Keystone antibiotics were ordered. He has not heard from the company yet. 5/31; patient presents for follow-up. He continues to use Hydrofera Blue to the wound bed. He denies signs of infection. Keystone antibiotics were ordered at last clinic visit and he states he called the company to pay for it. He states that it is being shipped. We rediscussed the total contact cast and he declines having this placed today. He states he is wearing the defender boot however he never wears it into the office. 04-06-2022 upon evaluation today patient appears to be doing about the same in  regard to his wound. This is not significantly smaller. He is not wearing his offloading boot. We discussed that today he tells me that he wears it "all the time as he chuckled and does not have it on during the office visit today. I think it is increasingly obvious that he is just messing around when he says this based on what I am seeing and otherwise I do not know why he would wear it all the time but not to the clinic. Either way my opinion based on what I am seeing currently is simply that the wound does seem to be still having quite a bit of callus buildup around the edges it is also very dry. He does tell me that he has been using the [facility] since he got it and to be honest I think this is good and should hopefully help keep things under control but at the same time I do not see any evidence of active infection. Again the Jodie Echevaria is a topical antibiotic with multiple compounds to help prevent further infection and worsening overall. 6/14; patient presents for follow-up. He has been using Keystone antibiotics with [employer]	[place] daily. He states he is wearing his  offloading boot. He does not have this in office today. He is going to the beach for the next 5 days. He states he Will not be using using any offloading device During this time. I again offered the total contact cast however he declined. I did ask him to reconsider for next clinic visit. He currently denies signs of infection. 6/21; patient presents for follow-up. He went to the beach last week. He did not use an offloading device. Wound is slightly deeper today. I again offered the total contact cast but he declined. Currently denies signs of infection. He reports using the [place], hydrofera blue and the Psychologist, forensic. 6/28; patient presents for follow-up. Patient declines total contact cast today. He has been using Keystone and [facility] to the wound bed. It is unclear if he is using the Psychologist, forensic. He would like a letter  for the post man to delivers mail to his front door so he does not have to walk down the driveway. 7/5; patient presents for follow-up. He states he is considering the total contact cast however does not want this placed today. He is actually going to the gym after this appointment. He does not want offloading foot wear for this. He has been using Keystone and [facility] to the wound bed. He denies signs of infection. 7/12; patient presents for follow-up. He is still considering the total contact cast however does not want this today. He has been using Keystone antibiotic and Hydrofera Blue to the wound bed. He denies signs of infection. He has no issues or complaints today. 7/26; patient presents for follow-up. He declines a total contact cast. He has been using his an antibiotic and Hydrofera Blue to the wound bed. He denies systemic signs of infection. He is scheduled to discuss orthotics next week with his podiatrist. 8/2; patient presents for follow-up. He has been using Hydrofera Blue to the wound bed. He has no issues or complaints today. He declines a total contact cast. He states he is getting orthotics today. 8/9; patient presents for follow-up. He has been using Hydrofera Blue to the wound bed. He again declines the total contact cast. He does state that he is considering starting it at the end of the month. He has a vacation coming up and does not want to have it in place for that. 8/16; patient presents for follow-up. He has been using Hydrofera Blue and Keystone to the wound bed. He started taking Augmentin and doxycycline as prescribed. He denies signs of infection. He is going on his vacation next week and will be back in 2 weeks. He states he is considering the total contact cast for when he comes back from his trip. 8/30; patient present for follow-up. He has been using Hydrofera Blue and Keystone to the wound bed. He continues to take doxycycline and Augmentin without issues.  He went on vacation and reports walking for long periods of time without pressure relief to the wound bed. He currently denies systemic signs of infection. 9/6; patient presents for follow-up. He has been using Hydrofera Blue and Keystone to the wound bed. He is not wearing his offloading boot. He declines a total contact cast. He has finished Augmentin and doxycycline. He had an MRI completed on 07/01/2022 that did not show an underlying abscess, septic arthritis or osteomyelitis. He currently denies systemic signs of infection. 9/13; patient presents for follow-up. Has been using Hydrofera Blue and Keystone to the wound bed. He does not present with  his offloading boot. He declines the total contact cast. He has been approved for PuraPly. We discussed this today and he would like this placed at next clinic visit. He denies signs of infection. 9/20; patient presents for follow-up. We have been using Hydrofera Blue and Keystone antibiotic ointment. Patient denies signs of infection. PuraPly is available for placement and patient would like to proceed with this. Objective Constitutional Vitals Time Taken: 8:09 AM, Height: 74 in, Weight: 226 lbs, BMI: 29, Temperature: 98.1 F, Pulse: 75 bpm, Respiratory Rate: 18 breaths/min, Blood Pressure: Favor, Soua ([id number]) [id number] Page 9 of 11 137/86 mmHg. General Notes: Right foot: T the plantar aspect of the first metatarsal head there is an open wound with granulation tissue, Nonviable tissue and callus. There o is significant undermining to the 7oo11 o'clock position. No increased warmth, erythema or purulent drainage noted. Integumentary (Hair, Skin) Wound #1 status is Open. Original cause of wound was Blister. The date acquired was: 07/15/2020. The wound has been in treatment 36 weeks. The wound is located on the Right,Plantar Foot. The wound measures 0.7cm length x 0.5cm width x 0.2cm depth; 0.275cm^2 area and  0.055cm^3 volume. There is Fat Layer (Subcutaneous Tissue) exposed. There is a medium amount of serosanguineous drainage noted. The wound margin is thickened. There is large (67-100%) red granulation within the wound bed. There is a small (1-33%) amount of necrotic tissue within the wound bed including Adherent Slough. 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 I debrided nonviable tissue. PuraPly #1 was placed in standard fashion today. I recommended continuing to aggressively offload the area. He has a Psychologist, forensic. Procedures Wound #1 Pre-procedure diagnosis of Wound #1 is a Diabetic Wound/Ulcer of the Lower Extremity located on the Right,Plantar Foot .Severity of Tissue Pre Debridement is: Fat layer exposed. There was a Excisional Skin/Subcutaneous Tissue Debridement with a total area of 1 sq cm performed by Geralyn Corwin, MD. With the following instrument(s): Curette to remove Viable and Non-Viable tissue/material. Material removed includes Callus, Subcutaneous Tissue, and Slough. A time out was conducted at 08:33, prior to the start of the procedure. A Minimum amount of bleeding was controlled with Pressure. The procedure was tolerated well. Post Debridement Measurements: 0.7cm length x 0.5cm width x 0.3cm depth; 0.082cm^3 volume. Character of Wound/Ulcer Post Debridement is stable. Severity of Tissue Post Debridement is: Fat layer exposed. Post procedure Diagnosis Wound #1: Same as Pre-Procedure Pre-procedure diagnosis of Wound #1 is a Diabetic Wound/Ulcer of the Lower Extremity located on the Right,Plantar Foot. A skin graft procedure using a bioengineered skin substitute/cellular or  tissue based product was performed by Geralyn Corwin, MD with the following instrument(s): Scissors. Puraply was applied and secured with Steri-Strips. 1.55 sq cm of product was utilized and 0.45 sq cm was wasted due to not needed. Post Application, bolus was applied. A Time Out was conducted at 08:41, prior to the start of the procedure. The procedure was tolerated well with a pain level of 1 throughout and a pain level of 1 following the procedure. Post procedure Diagnosis Wound #1: Same as Pre-Procedure . Plan Follow-up Appointments: Return Appointment in 1 week. Nurse Visit as needed Bathing/ Shower/ Hygiene: May shower; gently cleanse wound with antibacterial soap, rinse and pat dry prior  to dressing wounds No tub bath. Anesthetic (Use 'Patient Medications' Section for Anesthetic Order Entry): Lidocaine applied to wound bed Cellular or Tissue Based Products: Cellular or Tissue Based Product Type: - Puraply application #1 applied Edema Control - Lymphedema / Segmental Compressive Device / Other: Elevate, Exercise Daily and Avoid Standing for Long Periods of Time. Elevate leg(s) parallel to the floor when sitting. DO YOUR BEST to sleep in the bed at night. DO NOT sleep in your recliner. Long hours of sitting in a recliner leads to swelling of the legs and/or potential wounds on your backside. Off-Loading: Foot Defender  - right foot-keep shin covered-keep pressure off of the wound (Patient is not wear boot to clinic each week. Wearing tennis shoe) Additional Orders / Instructions: Follow Nutritious Diet and Increase Protein Intake - monitor blood sugar to maintain normal limits Other: - Follow up post surgery for left foot-[facility] handling Medications-Please add to medication list.: P.O. Antibiotics - Continue keystone and oral antibiotics WOUND #1: - Foot Wound Laterality: Plantar, Right Cleanser: Wound Cleanser Every Other Day/30 Days Discharge Instructions: Wash your  hands with soap and water. Remove old dressing, discard into plastic bag and place into trash. Cleanse the wound with Wound Cleanser prior to applying a clean dressing using gauze sponges, not tissues or cotton balls. Do not scrub or use excessive force. Pat dry using gauze sponges, not tissue or cotton balls. [name], [name] ([id number]) [id number] Page 10 of 11 Prim Dressing: PuraPly Every Other Day/30 Days ary Secondary Dressing: Zetuvit Plus 4x4 (in/in) (Generic) Every Other Day/30 Days Secured With: Coban Cohesive Bandage 4x5 (yds) Stretched Every Other Day/30 Days Discharge Instructions: Apply Coban as directed. 1. In office sharp debridement 2. PuraPly #1 placed in standard fashion 3. Follow-up in 1 week 4. Aggressive offloading Electronic Signature(s) Signed: 07/20/2022 9:02:46 AM By: Kalman Shan DO Entered By: Kalman Shan on 07/20/2022 08:54:20 -------------------------------------------------------------------------------- ROS/PFSH Details Patient Name: Date of Service: [name] [name] 07/20/2022 8:15 A M Medical Record Number: [id number] Patient Account Number: [id number] Date of Birth/Sex: Treating [name]: 09/28/1956 (65 y.o. Jerilynn Mages) Carlene Coria Primary Care Provider: [name] Other Clinician: Massie Kluver Referring Provider: Treating Provider/Extender: Wynell Balloon Weeks in Treatment: 47 Information Obtained From Patient Endocrine Medical History: Positive for: Type II Diabetes Time with diabetes: 76 years Treated with: Oral agents, Diet Blood sugar tested every day: No Musculoskeletal Medical History: Positive for: Osteoarthritis Immunizations Pneumococcal Vaccine: Received Pneumococcal Vaccination: Yes Received Pneumococcal Vaccination On or After 60th Birthday: Yes Implantable Devices None Family and Social History Former smoker - ended on 10/07/1997; Marital Status - Divorced; Alcohol Use: Never -  stopped 4 yrs ago; Drug Use: No History; Caffeine Use: Daily Electronic Signature(s) Signed: 07/20/2022 9:02:46 AM By: Kalman Shan DO Signed: 07/22/2022 9:44:45 AM By: Carlene Coria [name] Entered By: Kalman Shan on 07/20/2022 08:55:10 [name] ([id number]) [id number] Page 11 of 11 -------------------------------------------------------------------------------- SuperBill Details Patient Name: Date of Service: [name], [name] 07/20/2022 Medical Record Number: [id number] Patient Account Number: [id number] Date of Birth/Sex: Treating [name]: 1956/06/13 (65 y.o. Jerilynn Mages) Carlene Coria Primary Care Provider: [name] Other Clinician: Massie Kluver Referring Provider: Treating Provider/Extender: Wynell Balloon Weeks in Treatment: 36 Diagnosis Coding ICD-10 Codes Code Description [phone] 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ed E11.621 Type 2 diabetes mellitus with foot ulcer E11.40 Type 2 diabetes mellitus with diabetic neuropathy, unspecified R26.89 Other abnormalities of gait and mobility Facility Procedures : [place] Code: [id number] Description: [phone]  PuraPly Product AM 2X2 (4sq CM) Modifier: Quantity: 1 : CPT4 Code: [id number] Description: 15275 - SKIN SUB GRAFT FACE/NK/HF/G ICD-10 Diagnosis Description E11.621 Type 2 diabetes mellitus with foot ulcer Modifier: Quantity: 1 Physician Procedures : CPT4 Code Description Modifier [id number] - WC PHYS SKIN SUB GRAFT FACE/NK/HF/G ICD-10 Diagnosis Description E11.621 Type 2 diabetes mellitus with foot ulcer Quantity: 1 Electronic Signature(s) Signed: 08/15/2022 6:27:07 AM By: [name], BSN, [name], CWS, [name], BSN Previous Signature: 08/15/2022 9:26:01 AM Version By: [name], BSN, [name], CWS, [name], BSN Previous Signature: 07/20/2022 9:02:46 AM Version By: Geralyn Corwin DO Entered By: [name], BSN, [name], CWS, [name] on 08/15/2022 09:27:06

## 2022-07-27 ENCOUNTER — Encounter (HOSPITAL_BASED_OUTPATIENT_CLINIC_OR_DEPARTMENT_OTHER): Payer: Medicare Other | Admitting: Internal Medicine

## 2022-07-27 DIAGNOSIS — L97512 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ed: Secondary | ICD-10-CM | POA: Diagnosis not present

## 2022-07-27 DIAGNOSIS — S91302A Unspecified open wound, left foot, initial encounter: Secondary | ICD-10-CM

## 2022-07-27 DIAGNOSIS — E11621 Type 2 diabetes mellitus with foot ulcer: Secondary | ICD-10-CM | POA: Diagnosis not present

## 2022-07-27 DIAGNOSIS — E1140 Type 2 diabetes mellitus with diabetic neuropathy, unspecified: Secondary | ICD-10-CM | POA: Diagnosis not present

## 2022-07-29 NOTE — Progress Notes (Signed)
BOYSIE, BONEBRAKE ([id number]) Visit Report for 07/27/2022 Arrival Information Details Patient Name: [name], [name] Date of Service: 07/27/2022 8:15 AM Medical Record Number: [id number] Patient Account Number: [id number] Date of Birth/Sex: 1956-02-08 (65 y.o. M) Treating RN: Cornell Barman Primary Care Shada Nienaber: [name] Other Clinician: Massie Kluver Referring Alison Kubicki: [name] Treating Findlay Dagher/Extender: Yaakov Guthrie in Treatment: 21 Visit Information History Since Last Visit All ordered tests and consults were completed: No Patient Arrived: Ambulatory Added or deleted any medications: No Arrival Time: 08:04 Any new allergies or adverse reactions: No Transfer Assistance: None Had a fall or experienced change in No Patient Requires Transmission-Based No activities of daily living that may affect Precautions: risk of falls: Patient Has Alerts: Yes Hospitalized since last visit: No Patient Alerts: Patient on Blood Pain Present Now: Yes Thinner DIABETIC Plavix Electronic Signature(s) Signed: 07/29/2022 12:10:36 PM By: Massie Kluver Entered By: Massie Kluver on 07/27/2022 08:12:56 [name] ([id number]) -------------------------------------------------------------------------------- Clinic Level of Care Assessment Details Patient Name: [name] Date of Service: 07/27/2022 8:15 AM Medical Record Number: [id number] Patient Account Number: [id number] Date of Birth/Sex: 1956/03/04 (65 y.o. M) Treating RN: Cornell Barman Primary Care Farren Nelles: [name] Other Clinician: Massie Kluver Referring Daruis Swaim: [name] Treating Janney Priego/Extender: Yaakov Guthrie in Treatment: 37 Clinic Level of Care Assessment Items TOOL 1 Quantity Score _0  - Use when EandM and Procedure is performed on INITIAL visit 0 ASSESSMENTS - Nursing Assessment / Reassessment _1  - General Physical Exam (combine w/ comprehensive assessment (listed just below) when  performed on new 0 pt. evals) _2  - 0 Comprehensive Assessment (HX, ROS, Risk Assessments, Wounds Hx, etc.) ASSESSMENTS - Wound and Skin Assessment / Reassessment _3  - Dermatologic / Skin Assessment (not related to wound area) 0 ASSESSMENTS - Ostomy and/or Continence Assessment and Care _4  - Incontinence Assessment and Management 0 _5  - 0 Ostomy Care Assessment and Management (repouching, etc.) PROCESS - Coordination of Care _6  - Simple Patient / Family Education for ongoing care 0 _7  - 0 Complex (extensive) Patient / Family Education for ongoing care _8  - 0 Staff obtains Programmer, systems, Records, Test Results / Process Orders _9  - 0 Staff telephones HHA, Nursing Homes / Clarify orders / etc _10  - 0 Routine Transfer to another Facility (non-emergent condition) _11  - 0 Routine Hospital Admission (non-emergent condition) _12  - 0 New Admissions / Biomedical engineer / Ordering NPWT, Apligraf, etc. _13  - 0 Emergency Hospital Admission (emergent condition) PROCESS - Special Needs _14  - Pediatric / Minor Patient Management 0 _15  - 0 Isolation Patient Management _16  - 0 Hearing / Language / Visual special needs _17  - 0 Assessment of Community assistance (transportation, D/C planning, etc.) _18  - 0 Additional assistance / Altered mentation _19  - 0 Support Surface(s) Assessment (bed, cushion, seat, etc.) INTERVENTIONS - Miscellaneous _20  - External ear exam 0 _21  - 0 Patient Transfer (multiple staff / Civil Service fast streamer / Similar devices) _22  - 0 Simple Staple / Suture removal (25 or less) _23  - 0 Complex Staple / Suture removal (26 or more) _24  - 0 Hypo/Hyperglycemic Management (do not check if billed separately) _25  - 0 Ankle / Brachial Index (ABI) - do not check if billed separately Has the patient been seen at the hospital within the last three years: Yes Total Score: 0 Level Of Care: ____ [name] ([id number]) Electronic Signature(s) Signed: 07/29/2022 12:10:36 PM By: Massie Kluver Entered By: Massie Kluver on 07/27/2022 08:47:01 [name] ([id number]) -------------------------------------------------------------------------------- Encounter Discharge Information Details Patient Name: [name] Date of Service: 07/27/2022 8:15 AM  Medical Record Number: [id number] Patient Account Number: [id number] Date of Birth/Sex: 1956/09/20 (66 y.o. M) Treating RN: Cornell Barman Primary Care Achsah Mcquade: [name] Other Clinician: Massie Kluver Referring Imanni Burdine: [name] Treating Kennie Snedden/Extender: Yaakov Guthrie in Treatment: 82 Encounter Discharge Information Items Post Procedure Vitals Discharge Condition: Stable Temperature (F): 98.1 Ambulatory Status: Ambulatory Pulse (bpm): 97 Discharge Destination: Home Respiratory Rate (breaths/min): 18 Transportation: Private Auto Blood Pressure (mmHg): 123/85 Accompanied By: self Schedule Follow-up Appointment: Yes Clinical Summary of Care: Electronic Signature(s) Signed: 07/29/2022 12:10:36 PM By: Massie Kluver Entered By: Massie Kluver on 07/27/2022 09:03:35 [name] ([id number]) -------------------------------------------------------------------------------- Lower Extremity Assessment Details Patient Name: [name] Date of Service: 07/27/2022 8:15 AM Medical Record Number: [id number] Patient Account Number: [id number] Date of Birth/Sex: 07-16-1956 (65 y.o. M) Treating RN: Cornell Barman Primary Care Jenascia Bumpass: [name] Other Clinician: Massie Kluver Referring Titianna Loomis: [name] Treating Matylda Fehring/Extender: Yaakov Guthrie in Treatment: 37 Electronic Signature(s) Signed: 07/27/2022 5:32:28 PM By: Gretta Cool BSN, RN, CWS, Kim RN, BSN Signed: 07/29/2022 12:10:36 PM By: Massie Kluver Entered By: Massie Kluver on 07/27/2022 08:26:03 [name] ([id number]) -------------------------------------------------------------------------------- Multi Wound Chart  Details Patient Name: [name] Date of Service: 07/27/2022 8:15 AM Medical Record Number: [id number] Patient Account Number: [id number] Date of Birth/Sex: [birth year] (65 y.o. 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 of the Lower N/A Extremity Extremity Comorbid History: Type II Diabetes, Osteoarthritis Type II Diabetes, Osteoarthritis N/A Date Acquired: 07/15/2020 07/24/2022 N/A Weeks of Treatment: 37 0 N/A Wound Status: Open Open N/A Wound Recurrence: No No N/A Measurements L x W x D (cm) 0.9x0.6x0.3 1x0.9x0.2 N/A Area (cm) : 0.424 0.707 N/A Volume (cm) : 0.127 0.141 N/A % Reduction in Area: 62.20% N/A N/A % Reduction in Volume: 81.20% N/A N/A Classification: Grade 2 Grade 3 N/A Exudate Amount: Medium Medium N/A Exudate Type: Serosanguineous Serosanguineous N/A Exudate Color: red, brown red, brown N/A Wound Margin: Thickened Flat and Intact N/A Granulation Amount: Large (67-100%) None Present (0%) N/A Granulation Quality: Red N/A N/A Necrotic Amount: Small (1-33%) Small (1-33%) N/A Exposed Structures: Fat Layer (Subcutaneous Tissue): Fat Layer (Subcutaneous Tissue): N/A Yes Yes Fascia: No Fascia: No Tendon: No Tendon: No Muscle: No Muscle: No Joint: No Joint: No Bone: No Bone: No Epithelialization: None None N/A Treatment Notes Electronic Signature(s) Signed: 07/29/2022 12:10:36 PM By: Massie Kluver Entered By: Massie Kluver on 07/27/2022  08:30:53 [name] ([id number]) -------------------------------------------------------------------------------- [place] Details Patient Name: [name] Date of Service: 07/27/2022 8:15 AM Medical Record Number: [id number] Patient Account Number: [id number] Date of Birth/Sex: [birth year] (65 y.o. M) Treating RN: Cornell Barman Primary Care Myron Lona: [name] Other Clinician: Massie Kluver Referring Venisha Boehning: [name] Treating Ameen Mostafa/Extender: Yaakov Guthrie in Treatment: 3 Active Inactive Wound/Skin Impairment Nursing Diagnoses: Impaired tissue integrity Knowledge deficit related to smoking impact on wound healing Knowledge deficit related to ulceration/compromised skin integrity Goals: Patient/caregiver will verbalize understanding of skin care regimen Date Initiated: 11/10/2021 Date Inactivated: 12/01/2021 Target Resolution Date: 11/17/2021 Goal Status: Met Ulcer/skin breakdown will have a volume reduction of 30% by week 4 Date Initiated: 11/10/2021 Date Inactivated: 02/02/2022 Target Resolution Date: 12/08/2021 Goal Status:  Met Ulcer/skin breakdown will have a volume reduction of 50% by week 8 Date Initiated: 11/10/2021 Target Resolution Date: 01/05/2022 Goal Status: Active Ulcer/skin breakdown will have a volume reduction of 80% by week 12 Date Initiated: 11/10/2021 Target Resolution Date: 02/02/2022 Goal Status: Active Ulcer/skin breakdown will heal within 14 weeks Date Initiated: 11/10/2021 Target Resolution Date: 02/16/2022 Goal Status: Active Interventions: Assess patient/caregiver ability to obtain necessary supplies Assess patient/caregiver ability to perform ulcer/skin care regimen upon admission and as needed Assess ulceration(s) every visit Notes: Electronic Signature(s) Signed: 07/27/2022 5:32:28 PM By: Gretta Cool, BSN, RN, CWS, Kim RN, BSN Signed: 07/29/2022 12:10:36 PM By: Massie Kluver Entered By: Massie Kluver on 07/27/2022  08:30:43 [name] ([id number]) -------------------------------------------------------------------------------- Pain Assessment Details Patient Name: [name] Date of Service: 07/27/2022 8:15 AM Medical Record Number: [id number] Patient Account Number: [id number] Date of Birth/Sex: 09/21/1956 (65 y.o. M) Treating RN: Cornell Barman Primary Care Orland Visconti: [name] Other Clinician: Massie Kluver Referring Lorianna Spadaccini: [name] Treating Consuela Widener/Extender: Yaakov Guthrie in Treatment: 37 Active Problems Location of Pain Severity and Description of Pain Patient Has Paino Yes Site Locations Pain Location: Generalized Pain, Pain in Ulcers Duration of the Pain. Constant / Intermittento Constant Rate the pain. Current Pain Level: 6 Character of Pain Describe the Pain: Throbbing Pain Management and Medication Current Pain Management: Medication: Yes Rest: Yes Electronic Signature(s) Signed: 07/27/2022 5:32:28 PM By: Gretta Cool, BSN, RN, CWS, Kim RN, BSN Signed: 07/29/2022 12:10:36 PM By: Massie Kluver Entered By: Massie Kluver on 07/27/2022 08:15:06 [name] ([id number]) -------------------------------------------------------------------------------- Patient/Caregiver Education Details Patient Name: [name] Date of Service: 07/27/2022 8:15 AM Medical Record Number: [id number] Patient Account Number: [id number] Date of Birth/Gender: 10-16-1956 (65 y.o. M) Treating RN: Cornell Barman Primary Care Physician: [name] Other Clinician: Massie Kluver Referring Physician: [name] Treating Physician/Extender: Yaakov Guthrie in Treatment: 61 Education Assessment Education Provided To: Patient Education Topics Provided Wound/Skin Impairment: Handouts: Other: continue wound care as directed Methods: Explain/Verbal Responses: State content correctly Electronic Signature(s) Signed: 07/29/2022 12:10:36 PM By: Massie Kluver Entered By:  Massie Kluver on 07/27/2022 08:49:31 [name] ([id number]) -------------------------------------------------------------------------------- Wound Assessment Details Patient Name: [name] Date of Service: 07/27/2022 8:15 AM Medical Record Number: [id number] Patient Account Number: [id number] Date of Birth/Sex: Sep 29, 1956 (65 y.o. M) Treating RN: Cornell Barman Primary Care Jaylnn Ullery: [name] Other Clinician: Massie Kluver Referring Azyah Flett: [name] Treating Donyell Ding/Extender: Yaakov Guthrie in Treatment: 37 Wound Status Wound Number: 1 Primary Etiology: Diabetic Wound/Ulcer of the Lower Extremity Wound Location: Right, Plantar Foot Wound Status: Open Wounding Event: Blister Comorbid History: Type II Diabetes, Osteoarthritis Date Acquired: 07/15/2020 Weeks Of Treatment: 37 Clustered Wound: No Photos Wound Measurements Length: (cm) 0.9 Width: (cm) 0.6 Depth: (cm) 0.3 Area: (cm) 0.424 Volume: (cm) 0.127 % Reduction in Area: 62.2% % Reduction in Volume: 81.2% Epithelialization: None Wound Description Classification: Grade 2 Wound Margin: Thickened Exudate Amount: Medium Exudate Type: Serosanguineous Exudate Color: red, brown Foul Odor After Cleansing: No Slough/Fibrino Yes Wound Bed Granulation Amount: Large (67-100%) Exposed Structure Granulation Quality: Red Fascia Exposed: No Necrotic Amount: Small (1-33%) Fat Layer (Subcutaneous Tissue) Exposed: Yes Necrotic Quality: Adherent Slough Tendon Exposed: No Muscle Exposed: No Joint Exposed: No Bone Exposed: No Treatment Notes Wound #1 (Foot) Wound Laterality: Plantar, Right Cleanser Wound Cleanser Discharge Instruction: Wash your hands with soap and water. Remove old dressing, discard into plastic bag and place into trash. Cleanse the wound with Wound Cleanser prior to applying a clean dressing using gauze sponges, not tissues or cotton balls.  Do not scrub or use excessive force. Pat  dry using gauze sponges, not tissue or cotton balls. ESTEFAN, PATTISON ([id number]) Peri-Wound Care Topical Primary Dressing PuraPly Secondary Dressing Zetuvit Plus 4x4 (in/in) Secured With Conform 4'' - Conforming Stretch Gauze Bandage 4x75 (in/in) Discharge Instruction: Apply as directed Compression Wrap Compression Stockings Add-Ons Electronic Signature(s) Signed: 07/27/2022 5:32:28 PM By: Gretta Cool, BSN, RN, CWS, Kim RN, BSN Signed: 07/29/2022 12:10:36 PM By: Massie Kluver Entered By: Massie Kluver on 07/27/2022 08:23:50 [name] ([id number]) -------------------------------------------------------------------------------- Wound Assessment Details Patient Name: [name] Date of Service: 07/27/2022 8:15 AM Medical Record Number: [id number] Patient Account Number: [id number] Date of Birth/Sex: Oct 20, 1956 (65 y.o. M) Treating RN: Cornell Barman Primary Care Aarsh Fristoe: [name] Other Clinician: Massie Kluver Referring Eudell Julian: [name] Treating Roarke Marciano/Extender: Yaakov Guthrie in Treatment: 37 Wound Status Wound Number: 3 Primary Etiology: Diabetic Wound/Ulcer of the Lower Extremity Wound Location: Left, Plantar Foot Wound Status: Open Wounding Event: Pressure Injury Comorbid History: Type II Diabetes, Osteoarthritis Date Acquired: 07/24/2022 Weeks Of Treatment: 0 Clustered Wound: No Photos Wound Measurements Length: (cm) 1 % Redu Width: (cm) 0.9 % Redu Depth: (cm) 3.7 Epithe Area: (cm) 0.707 Tunne Volume: (cm) 2.615 Under ction in Area: 0% ction in Volume: -1754.6% lialization: None ling: No mining: No Wound Description Classification: Grade 3 Foul Wound Margin: Flat and Intact Sloug Exudate Amount: Medium Exudate Type: Serosanguineous Exudate Color: red, brown Odor After Cleansing: No h/Fibrino Yes Wound Bed Granulation Amount: None Present (0%) Exposed Structure Necrotic Amount: Small (1-33%) Fascia Exposed: No Fat Layer  (Subcutaneous Tissue) Exposed: Yes Tendon Exposed: No Muscle Exposed: No Joint Exposed: No Bone Exposed: No Treatment Notes Wound #3 (Foot) Wound Laterality: Plantar, Left Cleanser Dakin 16 (oz) 0.25 Discharge Instruction: wet to dry dressing Wound Cleanser Lardizabal, Gadge ([id number]) Discharge Instruction: Wash your hands with soap and water. Remove old dressing, discard into plastic bag and place into trash. Cleanse the wound with Wound Cleanser prior to applying a clean dressing using gauze sponges, not tissues or cotton balls. Do not scrub or use excessive force. Pat dry using gauze sponges, not tissue or cotton balls. Peri-Wound Care Topical Primary Dressing (BORDER) Zetuvit Plus Silicone Border Dressing 4x4 (in/in) Secondary Dressing Secured With Conform 4'' - Conforming Stretch Gauze Bandage 4x75 (in/in) Discharge Instruction: Apply as directed Compression Wrap Compression Stockings Add-Ons Electronic Signature(s) Signed: 07/27/2022 5:32:28 PM By: Gretta Cool, BSN, RN, CWS, Kim RN, BSN Signed: 07/29/2022 12:10:36 PM By: Massie Kluver Entered By: Massie Kluver on 07/27/2022 08:33:14 [name] ([id number]) -------------------------------------------------------------------------------- Vitals Details Patient Name: [name] Date of Service: 07/27/2022 8:15 AM Medical Record Number: [id number] Patient Account Number: [id number] Date of Birth/Sex: 10/21/1956 (65 y.o. M) Treating RN: Cornell Barman Primary Care Allenmichael Mcpartlin: [name] Other Clinician: Massie Kluver Referring Leon Goodnow: [name] Treating Marilene Vath/Extender: Yaakov Guthrie in Treatment: 37 Vital Signs Time Taken: 08:13 Temperature (F): 98.1 Height (in): 74 Pulse (bpm): 97 Weight (lbs): 226 Respiratory Rate (breaths/min): 18 Body Mass Index (BMI): 29 Blood Pressure (mmHg): 123/85 Reference Range: 80 - 120 mg / dl Electronic Signature(s) Signed: 07/29/2022 12:10:36 PM By: Massie Kluver Entered By: Massie Kluver on 07/27/2022 08:15:02

## 2022-07-29 NOTE — Progress Notes (Addendum)
DORMAN, CALDERWOOD ([id number]) [id number] Page 1 of 13 Visit Report for 07/27/2022 Chief Complaint Document Details Patient Name: Date of Service: [name], [name] 07/27/2022 8:15 A M Medical Record Number: [id number] Patient Account Number: [id number] Date of Birth/Sex: Treating RN: 12-Nov-1955 (66 y.o. [name] Primary Care Provider: Manson Allan Other Clinician: Betha Loa Referring Provider: Treating Provider/Extender: Collins Scotland Weeks in Treatment: 37 Information Obtained from: Patient Chief Complaint Right plantar foot wound Electronic Signature(s) Signed: 07/27/2022 12:26:22 PM By: Geralyn Corwin DO Entered By: Geralyn Corwin on 07/27/2022 09:15:23 -------------------------------------------------------------------------------- Cellular or Tissue Based Product Details Patient Name: Date of Service: [name], [name] 07/27/2022 8:15 A M Medical Record Number: [id number] Patient Account Number: [id number] Date of Birth/Sex: Treating RN: [birth year] (66 y.o. [name] Primary Care Provider: Manson Allan Other Clinician: Betha Loa Referring Provider: Treating Provider/Extender: Collins Scotland Weeks in Treatment: 37 Cellular or Tissue Based Product Type Wound #1 Right,Plantar Foot Applied to: Performed By: Physician Geralyn Corwin, MD Cellular or Tissue Based Product Type: Puraply Level of Consciousness (Pre-procedure): Awake and Alert Pre-procedure Verification/Time Out Yes - 08:42 Taken: Location: genitalia / hands / feet / multiple digits Wound Size (sq cm): 0.54 Product Size (sq cm): 4 Waste Size (sq cm): 3.5 Waste Reason: wound size Amount of Product Applied (sq cm): 0.5 Instrument Used: Forceps, Scissors Lot #: [id number] Order #: 2 Expiration Date: 10/17/2024 FenestratedPARMVIR, [name] ([id number]) [id number] Page 2 of 13 Reconstituted:  No Secured: Yes Secured With: Steri-Strips Dressing Applied: Yes Primary Dressing: bolster Response to Treatment: Procedure was tolerated well Level of Consciousness (Post- Awake and Alert procedure): Post Procedure Diagnosis Same as Pre-procedure Electronic Signature(s) Signed: 08/15/2022 6:28:45 AM By: Elliot Gurney, BSN, RN, CWS, Kim RN, BSN Previous Signature: 07/29/2022 12:10:36 PM Version By: Betha Loa Entered By: Elliot Gurney BSN, RN, CWS, Kim on 08/15/2022 62:95:28 -------------------------------------------------------------------------------- Debridement Details Patient Name: Date of Service: [name] 07/27/2022 8:15 A M Medical Record Number: [id number] Patient Account Number: [id number] Date of Birth/Sex: Treating RN: January 24, 1956 (66 y.o. [name] Primary Care Provider: Manson Allan Other Clinician: Betha Loa Referring Provider: Treating Provider/Extender: Collins Scotland Weeks in Treatment: 37 Debridement Performed for Assessment: Wound #3 Left,Plantar Foot Performed By: Physician Geralyn Corwin, MD Debridement Type: Debridement Severity of Tissue Pre Debridement: Fat layer exposed Level of Consciousness (Pre-procedure): Awake and Alert Pre-procedure Verification/Time Out Yes - 08:35 Taken: Start Time: 08:35 T Area Debrided (L x [name]): otal 1 (cm) x 0.9 (cm) = 0.9 (cm) Tissue and other material debrided: Viable, Non-Viable, Callus, Slough, Subcutaneous, Slough Level: Skin/Subcutaneous Tissue Debridement Description: Excisional Instrument: Curette Bleeding: Minimum Hemostasis Achieved: Pressure Response to Treatment: Procedure was tolerated well Level of Consciousness (Post- Awake and Alert procedure): Post Debridement Measurements of Total Wound Length: (cm) 1 Width: (cm) 0.9 Depth: (cm) 3.7 Volume: (cm) 2.615 Character of Wound/Ulcer Post Debridement: Stable Severity of Tissue Post Debridement: Fat layer exposed Post Procedure  Diagnosis Same as Pre-procedure Electronic Signature(s) Signed: 07/27/2022 12:26:22 PM By: Geralyn Corwin DO Signed: 07/27/2022 5:32:28 PM By: Elliot Gurney, BSN, RN, CWS, Kim RN, BSN Signed: 07/29/2022 12:10:36 PM By: Betha Loa Entered By: Betha Loa on 07/27/2022 08:37:26 [name] ([id number]) [id number] Page 3 of 13 -------------------------------------------------------------------------------- Debridement Details Patient Name: Date of Service: [name], [name] 07/27/2022 8:15 A M Medical Record Number: [id number] Patient Account Number: [id number] Date of Birth/Sex: Treating RN: 02-07-1956 (66 y.o. [name] Primary Care Provider: Manson Allan Other Clinician: Betha Loa Referring Provider: Treating Provider/Extender:  Collins ScotlandHoffman, Joachim Carton Fields, Glenda Weeks in Treatment: 37 Debridement Performed for Assessment: Wound #1 Right,Plantar Foot Performed By: Physician Geralyn CorwinHoffman, Tyronne Blann, MD Debridement Type: Debridement Severity of Tissue Pre Debridement: Fat layer exposed Level of Consciousness (Pre-procedure): Awake and Alert Pre-procedure Verification/Time Out Yes - 08:36 Taken: Start Time: 08:36 T Area Debrided (L x [name]): otal 1 (cm) x 1 (cm) = 1 (cm) Tissue and other material debrided: Viable, Non-Viable, Callus, Slough, Subcutaneous, Slough Level: Skin/Subcutaneous Tissue Debridement Description: Excisional Instrument: Curette Bleeding: Minimum Hemostasis Achieved: Pressure Response to Treatment: Procedure was tolerated well Level of Consciousness (Post- Awake and Alert procedure): Post Debridement Measurements of Total Wound Length: (cm) 0.9 Width: (cm) 0.6 Depth: (cm) 0.3 Volume: (cm) 0.127 Character of Wound/Ulcer Post Debridement: Stable Severity of Tissue Post Debridement: Fat layer exposed Post Procedure Diagnosis Same as Pre-procedure Electronic Signature(s) Signed: 07/27/2022 12:26:22 PM By: Geralyn CorwinHoffman, Juliannah Ohmann  DO Signed: 07/27/2022 5:32:28 PM By: Elliot GurneyWoody, BSN, RN, CWS, Kim RN, BSN Signed: 07/29/2022 12:10:36 PM By: Betha LoaVenable, Angie Entered By: Betha LoaVenable, Angie on 07/27/2022 08:38:12 -------------------------------------------------------------------------------- HPI Details Patient Name: Date of Service: [name] MealyNEELY, [name] 07/27/2022 8:15 A M Medical Record Number: [id number] Patient Account Number: [id number] [name] MatinNEELY, [name] ([id number]) [id number] Page 4 of 13 Date of Birth/Sex: Treating RN: [birth year] (66 y.o. Arthur HolmsM) Woody, Kim Primary Care Provider: Other Clinician: Orvil FeilFields, Glenda Venable, Angie Referring Provider: Treating Provider/Extender: Collins ScotlandHoffman, Shannen Vernon Fields, Glenda Weeks in Treatment: 37 History of Present Illness HPI Description: Admission 11/10/2021 Mr. [name] MatinMichael Henkes is a 66 year old male with a past medical history of uncontrolled type 2 diabetes with last hemoglobin A1c of 8.1 complicated by peripheral neuropathy that presents to the clinic for a 2-year history of nonhealing ulcer to the bottom of his right foot. He states this started out as a blister caused by a work boot. He [place] [facility]. He is currently keeping the area clean and covered. He has insoles designed to help offload the wound bed. He currently denies signs of infection. 1/18; patient presents for follow-up. He has been using Hydrofera Blue to the wound bed. He has no issues or complaints today. He has not received the defender.[name]. He denies signs of infection. 2/1; patient presents for follow-up. He has been using Hydrofera Blue to the wound bed he states he received the defender boot and has been using it however he did not have it on today. He currently denies signs of infection to the right foot. Unfortunately he developed a wound to the left great toe over the past week. He states he  received new orthotics and these caused a blister to the left great toe which turned into a wound. He has not been doing anything to the wound bed. He currently denies systemic signs of infection. 2/8; Patient presents for follow-up. 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 He states he is about 90% recovered. He has some mild weakness to the left side. He reports taking the antibiotics prescribed at last clinic visit. He reports using Hydrofera Blue for dressing changes. He only uses the offloading boot when he is at home. He uses open toed shoes to the right foot. He currently denies signs of infection. 2/15; patient presents for follow-up. He states he is still taking the antibiotics prescribed. He reports using the Psychologist, forensicdefender boot  to the right foot and open toed shoe to the left foot however he is not wearing either today. He currently denies systemic signs of infection. He stated he cut down a tree last week and was outside doing yard work. 2/22; patient presents for follow-up. 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 He reports feeling well. He reports using the defender boot to the right leg and continues to use Hydrofera Blue with dressing changes. He denies systemic signs of infection. 3/8; patient presents for follow-up. He sees Dr. Alberteen Spindle tomorrow for follow-up of the left great toe amputation. This was a partial amputation. He reports he is taking Augmentin currently. He reports increased erythema to the left great toe amputation site. He also reports a decline in his right plantar foot wound. He reports it is draining more. He denies purulent drainage. 3/15; patient presents for follow-up. He did not see Dr. Alberteen Spindle last week but states he sees him today. He states he has been taking doxycycline in the past week and started gentamicin ointment.  He continues to use [facility] with dressing changes. He currently denies systemic signs of infection. 3/22; patient presents for follow-up. Patient had partial amputation of the left great toe on 2/15. 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 Alberteen Spindle. He reports no issues and has no complaints. He is currently taking Keflex and doxycycline prescribed by Dr. Alberteen Spindle. He continues to use gentamicin ointment and Hydrofera Blue to the right foot wound. He reports using the defender boot when he is at home. 3/29; patient presents for follow-up. He continues to be on antibiotics per podiatry. He has been using Hydrofera Blue and gentamicin ointment to the right plantar foot wound. He has no issues or complaints today. He denies signs of infection. 4/5; patient presents for follow-up. He has been using Dakin's wet-to-dry dressings with improvement to wound healing. He denies signs of infection. She has no issues or complaints today. 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 Use Hydrofera Blue for a prolonged period of time and has been using Dakin's wet-to-dry I think for the last 2 to 3 weeks. He has a Psychologist, forensic at home but he does not wear that coming into the clinic because he drives. He tells me he has been compliant with the defender boot short of when he goes out to drive. He lives alone. He also tells me he has had a previous left great toe amputation We are following him for the right foot wound. 4/26; patient presents for follow-up. He had worsening of his left foot wound and had surgical debridement on 4/16 by Dr. Fanny Skates. He is being followed by podiatry for this issue. He he is on IV Unasyn based on tissue culture by ID.[name] We are following him for the right foot wound. He has been using Dakin's wet-to- dry dressings without issues. He reports continued usage of the defender boot. 5/3; patient presents for follow-up. He has been using  Dakin's wet-to-dry dressings to the right foot wound. He has no issues or complaints today. He reports using the Psychologist, forensic. He reports offloading the foot wound when he is at home by sitting in a recliner. He denies signs of infection. He is still getting IV antibiotics For the left foot wound that is being followed by podiatry. He states the end date is later this month on 5/28. 5/17; patient presents for follow-up. He missed his last  clinic appointment. He has been using Dakin's wet-to-dry dressings. He states he is offloading the right foot wound with the defender boot although he does not have this today. He is still on IV antibiotics. He has no issues or complaints today. 5/24; patient presents for follow-up. He has been using Hydrofera Blue to the wound bed without issues. He denies signs of infection. At last clinic visit he had a wound culture done that detected high levels of Enterococcus faecalis and low levels of Corynebacterium stratum and Staphylococcus epidermidis. [facility] antibiotics were ordered. He has not heard from the company yet. 5/31; patient presents for follow-up. He continues to use Hydrofera Blue to the wound bed. He denies signs of infection. [facility] antibiotics were ordered at last clinic visit and he states he called the company to pay for it. He states that it is being shipped. We rediscussed the total contact cast and he declines having this placed today. He states he is wearing the defender boot however he never wears it into the office. 04-06-2022 upon evaluation today patient appears to be doing about the same in regard to his wound. This is not significantly smaller. He is not wearing his offloading boot. We discussed that today he tells me that he wears it "all the time as he chuckled and does not have it on during the office visit today. I think it is increasingly obvious that he is just messing around when he says this based on what I am seeing and otherwise I  do not know why he would wear it all the time but not to the clinic. Either way my opinion based on what I am seeing currently is simply that the wound does seem to be still having quite a bit of callus buildup around the edges it is also very dry. He does tell me that he has been using the [facility] since he got it and to be honest I think this is good and should hopefully help keep things under control but at the same time I do not see any evidence of active infection. Again the Jodie Echevaria is a topical antibiotic with multiple compounds to help prevent further infection and worsening overall. 6/14; patient presents for follow-up. He has been using Keystone antibiotics with [employer]	[place] daily. He states he is wearing his offloading boot. He does not have this in office today. He is going to the beach for the next 5 days. He states he Will not be using using any offloading device During this time. I again offered the total contact cast however he declined. I did ask him to reconsider for next clinic visit. He currently denies signs of infection. 6/21; patient presents for follow-up. He went to the beach last week. He did not use an offloading device. Wound is slightly deeper today. I again offered the total contact cast but he declined. Currently denies signs of infection. He reports using the [place], hydrofera blue and the Psychologist, forensic. [name], [name] ([id number]) [id number] Page 5 of 13 6/28; patient presents for follow-up. Patient declines total contact cast today. He has been using [facility] and [facility] to the wound bed. It is unclear if he is using the Psychologist, forensic. He would like a letter for the post man to delivers mail to his front door so he does not have to walk down the driveway. 7/5; patient presents for follow-up. He states he is considering the total contact cast however does not want this placed today. He is actually going  to the gym after this  appointment. He does not want offloading foot wear for this. He has been using Key[facility] and [facility] to the wound bed. He denies signs of infection. 7/12; patient presents for follow-up. He is still considering the total contact cast however does not want this today. He has been using Keystone antibiotic and Hydrofera Blue to the wound bed. He denies signs of infection. He has no issues or complaints today. 7/26; patient presents for follow-up. He declines a total contact cast. He has been using his an antibiotic and Hydrofera Blue to the wound bed. He denies systemic signs of infection. He is scheduled to discuss orthotics next week with his podiatrist. 8/2; patient presents for follow-up. He has been using Hydrofera Blue to the wound bed. He has no issues or complaints today. He declines a total contact cast. He states he is getting orthotics today. 8/9; patient presents for follow-up. He has been using Hydrofera Blue to the wound bed. He again declines the total contact cast. He does state that he is considering starting it at the end of the month. He has a vacation coming up and does not want to have it in place for that. 8/16; patient presents for follow-up. He has been using Hydrofera Blue and [facility] to the wound bed. He started taking Augmentin and doxycycline as prescribed. He denies signs of infection. He is going on his vacation next week and will be back in 2 weeks. He states he is considering the total contact cast for when he comes back from his trip. 8/30; patient present for follow-up. He has been using Hydrofera Blue and [facility] to the wound bed. He continues to take doxycycline and Augmentin without issues. He went on vacation and reports walking for long periods of time without pressure relief to the wound bed. He currently denies systemic signs of infection. 9/6; patient presents for follow-up. He has been using Hydrofera Blue and [facility] to the wound bed. He is not  wearing his offloading boot. He declines a total contact cast. He has finished Augmentin and doxycycline. He had an MRI completed on 07/01/2022 that did not show an underlying abscess, septic arthritis or osteomyelitis. He currently denies systemic signs of infection. 9/13; patient presents for follow-up. Has been using Hydrofera Blue and [facility] to the wound bed. He does not present with his offloading boot. He declines the total contact cast. He has been approved for PuraPly. We discussed this today and he would like this placed at next clinic visit. He denies signs of infection. 9/20; patient presents for follow-up. We have been using Hydrofera Blue and [facility] antibiotic ointment. Patient denies signs of infection. PuraPly is available for placement and patient would like to proceed with this. 9/27; patient presents for follow-up. PuraPly #1 was placed in standard fashion to the right foot at last clinic visit. He tolerated this well. Unfortunately has developed a wound to the plantar surface of the left foot. He states he noticed this Sunday. He has pain to the site. He denies purulent drainage increased warmth or erythema. Electronic Signature(s) Signed: 07/27/2022 12:26:22 PM By: Geralyn Corwin DO Entered By: Geralyn Corwin on 07/27/2022 09:16:02 -------------------------------------------------------------------------------- Physical Exam Details Patient Name: Date of Service: KEIMON, BASALDUA [name] 07/27/2022 8:15 A M Medical Record Number: [id number] Patient Account Number: [id number] Date of Birth/Sex: Treating RN: [birth year] (66 y.o. [name] Primary Care Provider: Manson Allan Other Clinician: Betha Loa Referring Provider: Treating Provider/Extender: Collins Scotland Weeks in  Treatment: 37 Constitutional . Cardiovascular . Psychiatric . Notes Right foot: T the plantar aspect of the first metatarsal head there is an open wound with granulation  tissue, Nonviable tissue and callus. o There is undermining to the 711 o'clock position. No signs of surrounding soft tissue infection. [name], [name] ([id number]) [id number] Page 6 of 13 Left foot: T the Third submetatarsal there is an open wound that probes closely to bone. There is some serosanguineous drainage on exam. No increased o warmth, erythema or purulent drainage. Electronic Signature(s) Signed: 07/27/2022 12:26:22 PM By: Geralyn Corwin DO Entered By: Geralyn Corwin on 07/27/2022 09:18:02 -------------------------------------------------------------------------------- Physician Orders Details Patient Name: Date of Service: [name] 07/27/2022 8:15 A M Medical Record Number: [id number] Patient Account Number: [id number] Date of Birth/Sex: Treating RN: 1956-03-23 (66 y.o. [name] Primary Care Provider: Manson Allan Other Clinician: Betha Loa Referring Provider: Treating Provider/Extender: Collins Scotland Weeks in Treatment: [phone] Verbal / Phone Orders: No Diagnosis Coding Follow-up Appointments Return Appointment in 1 week. Nurse Visit as needed Bathing/ Shower/ Hygiene May shower; gently cleanse wound with antibacterial soap, rinse and pat dry prior to dressing wounds No tub bath. Anesthetic (Use 'Patient Medications' Section for Anesthetic Order Entry) Lidocaine applied to wound bed Cellular or Tissue Based Products Cellular or Tissue Based Product Type: - Puraply application #2 applied right foot Edema Control - Lymphedema / Segmental Compressive Device / Other Elevate, Exercise Daily and A void Standing for Long Periods of Time. Elevate leg(s) parallel to the floor when sitting. DO YOUR BEST to sleep in the bed at night. DO NOT sleep in your recliner. Long hours of sitting in a recliner leads to swelling of the legs and/or potential wounds on your backside. Off-Loading Foot Defender - right foot-keep  shin covered-keep pressure off of the wound (Patient is not wear boot to clinic each week. Wearing tennis shoe) Additional Orders / Instructions Follow Nutritious Diet and Increase Protein Intake - monitor blood sugar to maintain normal limits Other: - Follow up post surgery for left foot-[facility] handling Medications-Please add to medication list. ntibiotics - take Augmentin and Doxycycline P.O. A Wound Treatment Wound #1 - Foot Wound Laterality: Plantar, Right Cleanser: Wound Cleanser Every Other Day/30 Days Discharge Instructions: Wash your hands with soap and water. Remove old dressing, discard into plastic bag and place into trash. Cleanse the wound with Wound Cleanser prior to applying a clean dressing using gauze sponges, not tissues or cotton balls. Do not scrub or use excessive force. Pat dry using gauze sponges, not tissue or cotton balls. Prim Dressing: PuraPly ary Every Other Day/30 Days Secondary Dressing: Zetuvit Plus 4x4 (in/in) (Generic) Every Other Day/30 Days Secured With: Conform 4'' - Conforming Stretch Gauze Bandage 4x75 (in/in) Every Other Day/30 Days Discharge Instructions: Apply as directed Wound #3 - Foot Wound Laterality: Plantar, Left [name], [name] ([id number]) [id number] Page 7 of 13 Cleanser: Dakin 16 (oz) 0.25 Every Other Day/30 Days Discharge Instructions: wet to dry dressing Cleanser: Wound Cleanser Every Other Day/30 Days Discharge Instructions: Wash your hands with soap and water. Remove old dressing, discard into plastic bag and place into trash. Cleanse the wound with Wound Cleanser prior to applying a clean dressing using gauze sponges, not tissues or cotton balls. Do not scrub or use excessive force. Pat dry using gauze sponges, not tissue or cotton balls. Prim Dressing: (BORDER) Zetuvit Plus Silicone Border Dressing 4x4 (in/in) ary Every Other Day/30 Days Secured With: Conform 4'' - Conforming Stretch Gauze  Bandage 4x75 (in/in) Every Other Day/30 Days Discharge Instructions: Apply as directed Patient Medications llergies: No Known Allergies A Notifications Medication Indication Start End 07/27/2022 amoxicillin-pot clavulanate DOSE 1 - oral 875 mg-125 mg tablet - 1 tablet oral BID x 14 days 07/27/2022 doxycycline hyclate DOSE 1 - oral 100 mg tablet - 1 tablet oral BID x 14 days Electronic Signature(s) Signed: 07/27/2022 12:26:22 PM By: Geralyn Corwin DO Previous Signature: 07/27/2022 8:54:30 AM Version By: Geralyn Corwin DO Entered By: Geralyn Corwin on 07/27/2022 09:21:48 -------------------------------------------------------------------------------- Problem List Details Patient Name: Date of Service: [name] 07/27/2022 8:15 A M Medical Record Number: [id number] Patient Account Number: [id number] Date of Birth/Sex: Treating RN: 1956/06/21 (66 y.o. [name] Primary Care Provider: Manson Allan Other Clinician: Betha Loa Referring Provider: Treating Provider/Extender: Collins Scotland Weeks in Treatment: 37 Active Problems ICD-10 Encounter Code Description Active Date MDM Diagnosis L97.512 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ed 11/10/2021 No Yes E11.621 Type 2 diabetes mellitus with foot ulcer 11/10/2021 No Yes E11.40 Type 2 diabetes mellitus with diabetic neuropathy, unspecified 11/10/2021 No Yes R26.89 Other abnormalities of gait and mobility 11/10/2021 No Yes S91.302A Unspecified open wound, left foot, initial encounter 07/27/2022 No Yes Wunschel, Anuel ([id number]) [id number] Page 8 of 13 Inactive Problems ICD-10 Code Description Active Date Inactive Date L97.528 Non-pressure chronic ulcer of other part of left foot with other specified severity 12/01/2021 12/01/2021 Resolved Problems Electronic Signature(s) Signed: 07/27/2022 12:26:22 PM By: Geralyn Corwin DO Entered By:  Geralyn Corwin on 07/27/2022 09:20:49 -------------------------------------------------------------------------------- Progress Note Details Patient Name: Date of Service: [name] 07/27/2022 8:15 A M Medical Record Number: [id number] Patient Account Number: [id number] Date of Birth/Sex: Treating RN: [birth year] (66 y.o. [name] Primary Care Provider: Manson Allan Other Clinician: Betha Loa Referring Provider: Treating Provider/Extender: Collins Scotland Weeks in Treatment: 37 Subjective Chief Complaint Information obtained from Patient Right plantar foot wound History of Present Illness (HPI) Admission 11/10/2021 Mr. Kamuela Magos is a 66 year old male with a past medical history of uncontrolled type 2 diabetes with last hemoglobin A1c of 8.1 complicated by peripheral neuropathy that presents to the clinic for a 2-year history of nonhealing ulcer to the bottom of his right foot. He states this started out as a blister caused by a work boot. He reports receiving wound care when he resided in [place]. He is reestablishing his wound care in [facility] today. He is currently keeping the area clean and covered. He has insoles designed to help offload the wound bed. He currently denies signs of infection. 1/18; patient presents for follow-up. He has been using Hydrofera Blue to the wound bed. He has no issues or complaints today. He has not received the defender.[name] He denies signs of infection. 2/1; patient presents for follow-up. He has been using Hydrofera Blue to the wound bed he states he received the defender boot and has been using it however he did not have it on today. He currently denies signs of infection to the right foot. Unfortunately he developed a wound to the left great toe over the past week. He states he received new orthotics and these caused a blister to the left great toe which turned into a wound. He has not been doing  anything to the wound bed. He currently denies systemic signs of infection. 2/8; Patient presents for follow-up. Since last seen in the clinic he experienced a CVA and was hospitalized for 2 days. 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 He states he is about 90% recovered. He has some mild weakness to the left side. He reports taking the antibiotics prescribed at last clinic visit. He reports using Hydrofera Blue for dressing changes. He only uses the offloading boot when he is at home. He uses open toed shoes to the right foot. He currently denies signs of infection. 2/15; patient presents for follow-up. He states he is still taking the antibiotics prescribed. He reports using the defender boot to the right foot and open toed shoe to the left foot however he is not wearing either today. He currently denies systemic signs of infection. He stated he cut down a tree last week and was outside doing yard work. 2/22; patient presents for follow-up. 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 He reports feeling well. He reports using the defender boot to the right leg and continues to use Hydrofera Blue with dressing changes. He denies systemic signs of infection. 3/8; patient presents for follow-up. He sees Dr. Alberteen Spindle tomorrow for follow-up of the left great toe amputation. This was a partial amputation. He reports he is taking Augmentin currently. He reports increased erythema to the left great toe amputation site. He also reports a decline in his right plantar foot wound. He reports it is draining more. He denies purulent drainage. 3/15; patient presents for follow-up. He did not see Dr. Alberteen Spindle last week but states he sees him today. He states he has been taking doxycycline in the past week and started gentamicin ointment. He continues to use [facility] with dressing changes. He currently denies systemic signs of infection. [name], [name]  ([id number]) [id number] Page 9 of 13 3/22; patient presents for follow-up. Patient had partial amputation of the left great toe on 2/15. 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 Alberteen Spindle. He reports no issues and has no complaints. He is currently taking Keflex and doxycycline prescribed by Dr. Alberteen Spindle. He continues to use gentamicin ointment and Hydrofera Blue to the right foot wound. He reports using the defender boot when he is at home. 3/29; patient presents for follow-up. He continues to be on antibiotics per podiatry. He has been using Hydrofera Blue and gentamicin ointment to the right plantar foot wound. He has no issues or complaints today. He denies signs of infection. 4/5; patient presents for follow-up. He has been using Dakin's wet-to-dry dressings with improvement to wound healing. He denies signs of infection. She has no issues or complaints today. 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 Use Hydrofera Blue for a prolonged period of time and has been using Dakin's wet-to-dry I think for the last 2 to 3 weeks. He has a Psychologist, forensic at home but he does not wear that coming into the clinic because he drives. He tells me he has been compliant with the defender boot short of when he goes out to drive. He lives alone. He also tells me he has had a previous left great toe amputation We are following him for the right foot wound. 4/26; patient presents for follow-up. He had worsening of his left foot wound and had surgical debridement on 4/16 by Dr. Fanny Skates. He is being followed by podiatry for this issue. He he is on IV Unasyn based on tissue culture by ID.[name] We are following him for the right foot wound. He has been using Dakin's wet-to- dry  dressings without issues. He reports continued usage of the defender boot. 5/3; patient presents for follow-up. He has been using Dakin's wet-to-dry dressings to the right foot  wound. He has no issues or complaints today. He reports using the Psychologist, forensic. He reports offloading the foot wound when he is at home by sitting in a recliner. He denies signs of infection. He is still getting IV antibiotics For the left foot wound that is being followed by podiatry. He states the end date is later this month on 5/28. 5/17; patient presents for follow-up. He missed his last clinic appointment. He has been using Dakin's wet-to-dry dressings. He states he is offloading the right foot wound with the defender boot although he does not have this today. He is still on IV antibiotics. He has no issues or complaints today. 5/24; patient presents for follow-up. He has been using Hydrofera Blue to the wound bed without issues. He denies signs of infection. At last clinic visit he had a wound culture done that detected high levels of Enterococcus faecalis and low levels of Corynebacterium stratum and Staphylococcus epidermidis. [facility] antibiotics were ordered. He has not heard from the company yet. 5/31; patient presents for follow-up. He continues to use Hydrofera Blue to the wound bed. He denies signs of infection. [facility] antibiotics were ordered at last clinic visit and he states he called the company to pay for it. He states that it is being shipped. We rediscussed the total contact cast and he declines having this placed today. He states he is wearing the defender boot however he never wears it into the office. 04-06-2022 upon evaluation today patient appears to be doing about the same in regard to his wound. This is not significantly smaller. He is not wearing his offloading boot. We discussed that today he tells me that he wears it "all the time as he chuckled and does not have it on during the office visit today. I think it is increasingly obvious that he is just messing around when he says this based on what I am seeing and otherwise I do not know why he would wear it all the time  but not to the clinic. Either way my opinion based on what I am seeing currently is simply that the wound does seem to be still having quite a bit of callus buildup around the edges it is also very dry. He does tell me that he has been using the [facility] since he got it and to be honest I think this is good and should hopefully help keep things under control but at the same time I do not see any evidence of active infection. Again the Jodie Echevaria is a topical antibiotic with multiple compounds to help prevent further infection and worsening overall. 6/14; patient presents for follow-up. He has been using Keystone antibiotics with [employer]	[place] daily. He states he is wearing his offloading boot. He does not have this in office today. He is going to the beach for the next 5 days. He states he Will not be using using any offloading device During this time. I again offered the total contact cast however he declined. I did ask him to reconsider for next clinic visit. He currently denies signs of infection. 6/21; patient presents for follow-up. He went to the beach last week. He did not use an offloading device. Wound is slightly deeper today. I again offered the total contact cast but he declined. Currently denies signs of infection. He reports using  the [place], hydrofera blue and the Conservation officer, nature. 6/28; patient presents for follow-up. Patient declines total contact cast today. He has been using Key[facility] and [facility] to the wound bed. It is unclear if he is using the Conservation officer, nature. He would like a letter for the post man to delivers mail to his front door so he does not have to walk down the driveway. 7/5; patient presents for follow-up. He states he is considering the total contact cast however does not want this placed today. He is actually going to the gym after this appointment. He does not want offloading foot wear for this. He has been using [facility] and [facility] to the wound bed. He denies  signs of infection. 7/12; patient presents for follow-up. He is still considering the total contact cast however does not want this today. He has been using Keystone antibiotic and Hydrofera Blue to the wound bed. He denies signs of infection. He has no issues or complaints today. 7/26; patient presents for follow-up. He declines a total contact cast. He has been using his an antibiotic and Hydrofera Blue to the wound bed. He denies systemic signs of infection. He is scheduled to discuss orthotics next week with his podiatrist. 8/2; patient presents for follow-up. He has been using Hydrofera Blue to the wound bed. He has no issues or complaints today. He declines a total contact cast. He states he is getting orthotics today. 8/9; patient presents for follow-up. He has been using Hydrofera Blue to the wound bed. He again declines the total contact cast. He does state that he is considering starting it at the end of the month. He has a vacation coming up and does not want to have it in place for that. 8/16; patient presents for follow-up. He has been using Hydrofera Blue and [facility]stone to the wound bed. He started taking Augmentin and doxycycline as prescribed. He denies signs of infection. He is going on his vacation next week and will be back in 2 weeks. He states he is considering the total contact cast for when he comes back from his trip. 8/30; patient present for follow-up. He has been using Hydrofera Blue and [facility]stone to the wound bed. He continues to take doxycycline and Augmentin without issues. He went on vacation and reports walking for long periods of time without pressure relief to the wound bed. He currently denies systemic signs of infection. 9/6; patient presents for follow-up. He has been using Hydrofera Blue and [facility] to the wound bed. He is not wearing his offloading boot. He declines a total contact cast. He has finished Augmentin and doxycycline. He had an MRI completed on  07/01/2022 that did not show an underlying abscess, septic arthritis or osteomyelitis. He currently denies systemic signs of infection. 9/13; patient presents for follow-up. Has been using Hydrofera Blue and [facility] to the wound bed. He does not present with his offloading boot. He declines the total contact cast. He has been approved for PuraPly. We discussed this today and he would like this placed at next clinic visit. He denies signs of infection. 9/20; patient presents for follow-up. We have been using Hydrofera Blue and [facility] antibiotic ointment. Patient denies signs of infection. PuraPly is available for placement and patient would like to proceed with this. 9/27; patient presents for follow-up. PuraPly #1 was placed in standard fashion to the right foot at last clinic visit. He tolerated this well. Unfortunately has developed a wound to the plantar surface of the left  foot. He states he noticed this Sunday. He has pain to the site. He denies purulent drainage increased warmth or erythema. [name], [name] ([id number]) [id number] Page 10 of 13 Patient History Information obtained from Patient. Social History Former smoker - ended on 10/07/1997, Marital Status - Divorced, Alcohol Use - Never - stopped 4 yrs ago, Drug Use - No History, Caffeine Use - Daily. Medical History Endocrine Patient has history of Type II Diabetes Musculoskeletal Patient has history of Osteoarthritis Objective Constitutional Vitals Time Taken: 8:13 AM, Height: 74 in, Weight: 226 lbs, BMI: 29, Temperature: 98.1 F, Pulse: 97 bpm, Respiratory Rate: 18 breaths/min, Blood Pressure: 123/85 mmHg. General Notes: Right foot: T the plantar aspect of the first metatarsal head there is an open wound with granulation tissue, Nonviable tissue and callus. There o is undermining to the 7oo11 o'clock position. No signs of surrounding soft tissue infection. Left foot: T the Third submetatarsal there  is an open wound that o probes closely to bone. There is some serosanguineous drainage on exam. No increased warmth, erythema or purulent drainage. Integumentary (Hair, Skin) Wound #1 status is Open. Original cause of wound was Blister. The date acquired was: 07/15/2020. The wound has been in treatment 37 weeks. The wound is located on the Right,Plantar Foot. The wound measures 0.9cm length x 0.6cm width x 0.3cm depth; 0.424cm^2 area and 0.127cm^3 volume. There is Fat Layer (Subcutaneous Tissue) exposed. There is a medium amount of serosanguineous drainage noted. The wound margin is thickened. There is large (67-100%) red granulation within the wound bed. There is a small (1-33%) amount of necrotic tissue within the wound bed including Adherent Slough. Wound #3 status is Open. Original cause of wound was Pressure Injury. The date acquired was: 07/24/2022. The wound is located on the Left,Plantar Foot. The wound measures 1cm length x 0.9cm width x 3.7cm depth; 0.707cm^2 area and 2.615cm^3 volume. There is Fat Layer (Subcutaneous Tissue) exposed. There is no tunneling or undermining noted. There is a medium amount of serosanguineous drainage noted. The wound margin is flat and intact. There is no granulation within the wound bed. There is a small (1-33%) amount of necrotic tissue within the wound bed. Assessment Active Problems ICD-10 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ed Type 2 diabetes mellitus with foot ulcer Type 2 diabetes mellitus with diabetic neuropathy, unspecified Other abnormalities of gait and mobility Unspecified open wound, left foot, initial encounter Patient's right plantar foot wound has shown improvement in size and appearance since last clinic visit. I debrided nonviable tissue and PuraPly #2 was placed in standard fashion. Unfortunately has developed a wound to the left plantar foot. I debrided this area. There is a narrow opening That  probes closely to bone and he is at high risk for amputation. I will go ahead and start him on antibiotics. If no improvement next week then we will start imaging this area. I recommended he go to the ED if he develops symptoms including fever/chills, nausea/vomiting, increased warmth or erythema to the area or purulent drainage. I recommended aggressive offloading. Follow-up in 1 week. Procedures Wound #1 Pre-procedure diagnosis of Wound #1 is a Diabetic Wound/Ulcer of the Lower Extremity located on the Right,Plantar Foot .Severity of Tissue Pre Debridement is: Fat layer exposed. There was a Excisional Skin/Subcutaneous Tissue Debridement with a total area of 1 sq cm performed by Geralyn Corwin, MD. With the following instrument(s): Curette to remove Viable and Non-Viable tissue/material. Material removed includes Callus, Subcutaneous Tissue, and  Slough. A time out was conducted at 08:36, prior to the start of the procedure. A Minimum amount of bleeding was controlled with Pressure. The procedure was tolerated well. Post Debridement Measurements: 0.9cm length x 0.6cm width x 0.3cm depth; 0.127cm^3 volume. Character of Wound/Ulcer Post Debridement is stable. Severity of Tissue Post Debridement is: Fat layer exposed. Post procedure Diagnosis Wound #1: Same as Pre-Procedure Pre-procedure diagnosis of Wound #1 is a Diabetic Wound/Ulcer of the Lower Extremity located on the Right,Plantar Foot. A skin graft procedure using a bioengineered skin substitute/cellular or tissue based product was performed by Geralyn Corwin, MD with the following instrument(s): Forceps and Scissors. Puraply was applied and secured with Steri-Strips. 0.5 sq cm of product was utilized and 3.5 sq cm was wasted due to wound size. Post Application, bolster was applied. A Time Out was conducted at 08:42, prior to the start of the procedure. The procedure was tolerated well. Post procedure Diagnosis Wound #1: Same as  Pre-Procedure [name], [name] ([id number]) [id number] Page 11 of 13 . Wound #3 Pre-procedure diagnosis of Wound #3 is a Diabetic Wound/Ulcer of the Lower Extremity located on the Left,Plantar Foot .Severity of Tissue Pre Debridement is: Fat layer exposed. There was a Excisional Skin/Subcutaneous Tissue Debridement with a total area of 0.9 sq cm performed by Geralyn Corwin, MD. With the following instrument(s): Curette to remove Viable and Non-Viable tissue/material. Material removed includes Callus, Subcutaneous Tissue, and Slough. A time out was conducted at 08:35, prior to the start of the procedure. A Minimum amount of bleeding was controlled with Pressure. The procedure was tolerated well. Post Debridement Measurements: 1cm length x 0.9cm width x 3.7cm depth; 2.615cm^3 volume. Character of Wound/Ulcer Post Debridement is stable. Severity of Tissue Post Debridement is: Fat layer exposed. Post procedure Diagnosis Wound #3: Same as Pre-Procedure Plan Follow-up Appointments: Return Appointment in 1 week. Nurse Visit as needed Bathing/ Shower/ Hygiene: May shower; gently cleanse wound with antibacterial soap, rinse and pat dry prior to dressing wounds No tub bath. Anesthetic (Use 'Patient Medications' Section for Anesthetic Order Entry): Lidocaine applied to wound bed Cellular or Tissue Based Products: Cellular or Tissue Based Product Type: - Puraply application #2 applied right foot Edema Control - Lymphedema / Segmental Compressive Device / Other: Elevate, Exercise Daily and Avoid Standing for Long Periods of Time. Elevate leg(s) parallel to the floor when sitting. DO YOUR BEST to sleep in the bed at night. DO NOT sleep in your recliner. Long hours of sitting in a recliner leads to swelling of the legs and/or potential wounds on your backside. Off-Loading: Foot Defender  - right foot-keep shin covered-keep pressure off of the wound (Patient is not wear boot  to clinic each week. Wearing tennis shoe) Additional Orders / Instructions: Follow Nutritious Diet and Increase Protein Intake - monitor blood sugar to maintain normal limits Other: - Follow up post surgery for left foot-[facility] handling Medications-Please add to medication list.: P.O. Antibiotics - take Augmentin and Doxycycline The following medication(s) was prescribed: amoxicillin-pot clavulanate oral 875 mg-125 mg tablet 1 1 tablet oral BID x 14 days starting 07/27/2022 doxycycline hyclate oral 100 mg tablet 1 1 tablet oral BID x 14 days starting 07/27/2022 WOUND #1: - Foot Wound Laterality: Plantar, Right Cleanser: Wound Cleanser Every Other Day/30 Days Discharge Instructions: Wash your hands with soap and water. Remove old dressing, discard into plastic bag and place into trash. Cleanse the wound with Wound Cleanser prior to applying a clean dressing using gauze sponges, not tissues or  cotton balls. Do not scrub or use excessive force. Pat dry using gauze sponges, not tissue or cotton balls. Prim Dressing: PuraPly Every Other Day/30 Days ary Secondary Dressing: Zetuvit Plus 4x4 (in/in) (Generic) Every Other Day/30 Days Secured With: Conform 4'' - Conforming Stretch Gauze Bandage 4x75 (in/in) Every Other Day/30 Days Discharge Instructions: Apply as directed WOUND #3: - Foot Wound Laterality: Plantar, Left Cleanser: Dakin 16 (oz) 0.25 Every Other Day/30 Days Discharge Instructions: wet to dry dressing Cleanser: Wound Cleanser Every Other Day/30 Days Discharge Instructions: Wash your hands with soap and water. Remove old dressing, discard into plastic bag and place into trash. Cleanse the wound with Wound Cleanser prior to applying a clean dressing using gauze sponges, not tissues or cotton balls. Do not scrub or use excessive force. Pat dry using gauze sponges, not tissue or cotton balls. Prim Dressing: (BORDER) Zetuvit Plus Silicone Border Dressing 4x4 (in/in) Every Other Day/30  Days ary Secured With: Conform 4'' - Conforming Stretch Gauze Bandage 4x75 (in/in) Every Other Day/30 Days Discharge Instructions: Apply as directed 1. In office sharp debridement 2. PuraPly #2 placed in standard fashionooright foot 3. Dakin's wet-to-dry dressings to the left foot 4. Augmentin and doxycycline 5. Aggressive offloading 6. Follow-up in 1 week Electronic Signature(s) Signed: 10/11/2022 1:44:42 PM By: Geralyn Corwin DO Previous Signature: 07/27/2022 12:26:22 PM Version By: Geralyn Corwin DO Entered By: Geralyn Corwin on 10/11/2022 13:44:42 [name] ([id number]) [id number] Page 12 of 13 -------------------------------------------------------------------------------- ROS/PFSH Details Patient Name: Date of Service: NICKI, GRACY [name] 07/27/2022 8:15 A M Medical Record Number: [id number] Patient Account Number: [id number] Date of Birth/Sex: Treating RN: 03/06/1956 (66 y.o. [name] Primary Care Provider: Manson Allan Other Clinician: Betha Loa Referring Provider: Treating Provider/Extender: Collins Scotland Weeks in Treatment: 37 Information Obtained From Patient Endocrine Medical History: Positive for: Type II Diabetes Time with diabetes: 40 years Treated with: Oral agents, Diet Blood sugar tested every day: No Musculoskeletal Medical History: Positive for: Osteoarthritis Immunizations Pneumococcal Vaccine: Received Pneumococcal Vaccination: Yes Received Pneumococcal Vaccination On or After 60th Birthday: Yes Implantable Devices None Family and Social History Former smoker - ended on 10/07/1997; Marital Status - Divorced; Alcohol Use: Never - stopped 4 yrs ago; Drug Use: No History; Caffeine Use: Daily Electronic Signature(s) Signed: 07/27/2022 12:26:22 PM By: Geralyn Corwin DO Signed: 07/27/2022 5:32:28 PM By: Elliot Gurney, BSN, RN, CWS, Kim RN, BSN Entered By: Geralyn Corwin on 07/27/2022  09:21:58 -------------------------------------------------------------------------------- SuperBill Details Patient Name: Date of Service: [name] 07/27/2022 Medical Record Number: [id number] Patient Account Number: [id number] Date of Birth/Sex: Treating RN: 1956/05/15 (66 y.o. [name] Primary Care Provider: Manson Allan Other Clinician: Betha Loa Referring Provider: Treating Provider/Extender: Collins Scotland [place], [place] ([id number]) [id number] Page 13 of 13 Weeks in Treatment: 37 Diagnosis Coding ICD-10 Codes Code Description [id number] 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ed E11.621 Type 2 diabetes mellitus with foot ulcer E11.40 Type 2 diabetes mellitus with diabetic neuropathy, unspecified R26.89 Other abnormalities of gait and mobility S91.302A Unspecified open wound, left foot, initial encounter Facility Procedures : CPT4 Code: 41324401 Description: Q4196 PuraPly Product AM 2X2 (4sq CM) Modifier: Quantity: 1 : CPT4 Code: 02725366 Description: 15275 - SKIN SUB GRAFT FACE/NK/HF/G ICD-10 Diagnosis Description E11.621 Type 2 diabetes mellitus with foot ulcer Modifier: Quantity: 1 Physician Procedures : CPT4 Code Description Modifier 4403474 15275 - WC PHYS SKIN SUB GRAFT FACE/NK/HF/G ICD-10 Diagnosis Description E11.621 Type 2 diabetes mellitus with foot ulcer Quantity: 1 Electronic  Signature(s) Signed: 08/15/2022 9:29:30 AM By: Elliot Gurney, BSN, RN, CWS, Kim RN, BSN Signed: 08/17/2022 11:21:14 AM By: Geralyn Corwin DO Previous Signature: 07/27/2022 12:26:22 PM Version By: Geralyn Corwin DO Entered By: Elliot Gurney BSN, RN, CWS, Kim on 08/15/2022 16:10:96

## 2022-08-03 ENCOUNTER — Ambulatory Visit
Admission: RE | Admit: 2022-08-03 | Discharge: 2022-08-03 | Disposition: A | Discharge status: Home / Self Care | Payer: Medicare Other | Attending: Internal Medicine | Admitting: Internal Medicine

## 2022-08-03 ENCOUNTER — Other Ambulatory Visit: Payer: Self-pay | Admitting: Internal Medicine

## 2022-08-03 ENCOUNTER — Ambulatory Visit
Admission: RE | Admit: 2022-08-03 | Discharge: 2022-08-03 | Disposition: A | Discharge status: Home / Self Care | Payer: Medicare Other | Source: Ambulatory Visit | Attending: Internal Medicine | Admitting: Internal Medicine

## 2022-08-03 ENCOUNTER — Encounter: Payer: Medicare Other | Attending: Physician Assistant | Admitting: Internal Medicine

## 2022-08-03 DIAGNOSIS — M79672 Pain in left foot: Secondary | ICD-10-CM | POA: Diagnosis not present

## 2022-08-03 DIAGNOSIS — E1165 Type 2 diabetes mellitus with hyperglycemia: Secondary | ICD-10-CM | POA: Insufficient documentation

## 2022-08-03 DIAGNOSIS — X58XXXA Exposure to other specified factors, initial encounter: Secondary | ICD-10-CM | POA: Diagnosis not present

## 2022-08-03 DIAGNOSIS — R2689 Other abnormalities of gait and mobility: Secondary | ICD-10-CM | POA: Insufficient documentation

## 2022-08-03 DIAGNOSIS — E1142 Type 2 diabetes mellitus with diabetic polyneuropathy: Secondary | ICD-10-CM | POA: Insufficient documentation

## 2022-08-03 DIAGNOSIS — L97512 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ed: Secondary | ICD-10-CM | POA: Diagnosis not present

## 2022-08-03 DIAGNOSIS — Z89412 Acquired absence of left great toe: Secondary | ICD-10-CM | POA: Diagnosis not present

## 2022-08-03 DIAGNOSIS — M869 Osteomyelitis, unspecified: Secondary | ICD-10-CM

## 2022-08-03 DIAGNOSIS — S91302A Unspecified open wound, left foot, initial encounter: Secondary | ICD-10-CM | POA: Diagnosis not present

## 2022-08-03 DIAGNOSIS — E11621 Type 2 diabetes mellitus with foot ulcer: Secondary | ICD-10-CM | POA: Insufficient documentation

## 2022-08-08 NOTE — Progress Notes (Signed)
ADNAN, VANVOORHIS ([id number]) Visit Report for 08/03/2022 Arrival Information Details Patient Name: [name], [name] Date of Service: 08/03/2022 8:15 AM Medical Record Number: [id number] Patient Account Number: [id number] Date of Birth/Sex: [birth year] (66 y.o. M) Treating RN: Cornell Barman Primary Care : [name] Other Clinician: Massie Kluver Referring : [name] Treating /Extender: Yaakov Guthrie in Treatment: 11 Visit Information History Since Last Visit All ordered tests and consults were completed: No Patient Arrived: Ambulatory Added or deleted any medications: No Arrival Time: 07:56 Any new allergies or adverse reactions: No Transfer Assistance: None Had a fall or experienced change in No Patient Requires Transmission-Based No activities of daily living that may affect Precautions: risk of falls: Patient Has Alerts: Yes Hospitalized since last visit: No Patient Alerts: Patient on Blood Pain Present Now: Yes Thinner DIABETIC Plavix Electronic Signature(s) Signed: 08/08/2022 4:50:08 PM By: Massie Kluver Entered By: Massie Kluver on 08/03/2022 08:15:37 [name] ([id number]) -------------------------------------------------------------------------------- Clinic Level of Care Assessment Details Patient Name: [name] Date of Service: 08/03/2022 8:15 AM Medical Record Number: [id number] Patient Account Number: [id number] Date of Birth/Sex: 1956-08-17 (66 y.o. M) Treating RN: Cornell Barman Primary Care : [name] Other Clinician: Massie Kluver Referring : [name] Treating /Extender: Yaakov Guthrie in Treatment: 53 Clinic Level of Care Assessment Items TOOL 1 Quantity Score [] - Use when EandM and Procedure is performed on INITIAL visit 0 ASSESSMENTS - Nursing Assessment / Reassessment [] - General Physical Exam (combine w/ comprehensive assessment (listed just below) when  performed on new 0 pt. evals) [] - 0 Comprehensive Assessment (HX, ROS, Risk Assessments, Wounds Hx, etc.) ASSESSMENTS - Wound and Skin Assessment / Reassessment [] - Dermatologic / Skin Assessment (not related to wound area) 0 ASSESSMENTS - Ostomy and/or Continence Assessment and Care [] - Incontinence Assessment and Management 0 [] - 0 Ostomy Care Assessment and Management (repouching, etc.) PROCESS - Coordination of Care [] - Simple Patient / Family Education for ongoing care 0 [] - 0 Complex (extensive) Patient / Family Education for ongoing care [] - 0 Staff obtains Consents, Records, Test Results / Process Orders [] - 0 Staff telephones HHA, Nursing Homes / Clarify orders / etc [] - 0 Routine Transfer to another Facility (non-emergent condition) [] - 0 Routine Hospital Admission (non-emergent condition) [] - 0 New Admissions / Biomedical engineer / Ordering NPWT, Apligraf, etc. [] - 0 Emergency Hospital Admission (emergent condition) PROCESS - Special Needs [] - Pediatric / Minor Patient Management 0 [] - 0 Isolation Patient Management [] - 0 Hearing / Language / Visual special needs [] - 0 Assessment of Community assistance (transportation, D/C planning, etc.) [] - 0 Additional assistance / Altered mentation [] - 0 Support Surface(s) Assessment (bed, cushion, seat, etc.) INTERVENTIONS - Miscellaneous [] - External ear exam 0 [] - 0 Patient Transfer (multiple staff / Civil Service fast streamer / Similar devices) [] - 0 Simple Staple / Suture removal (25 or less) [] - 0 Complex Staple / Suture removal (26 or more) [] - 0 Hypo/Hyperglycemic Management (do not check if billed separately) [] - 0 Ankle / Brachial Index (ABI) - do not check if billed separately Has the patient been seen at the hospital within the last three years: Yes Total Score: 0 Level Of Care: ____ [name] ([id number]) Electronic Signature(s) Signed: 08/08/2022 4:50:08 PM By: Massie Kluver Entered By: Massie Kluver on 08/03/2022 08:50:51 [name] ([id number]) -------------------------------------------------------------------------------- Encounter Discharge Information Details Patient Name: [name] Date of Service: 08/03/2022 8:15 AM  Medical Record Number: [id number] Patient Account Number: [id number] Date of Birth/Sex: 1956/07/23 (66 y.o. M) Treating RN: Cornell Barman Primary Care : [name] Other Clinician: Massie Kluver Referring : [name] Treating /Extender: Yaakov Guthrie in Treatment: 37 Encounter Discharge Information Items Post Procedure Vitals Discharge Condition: Stable Temperature (F): 97.9 Ambulatory Status: Ambulatory Pulse (bpm): 78 Discharge Destination: Home Respiratory Rate (breaths/min): 16 Transportation: Private Auto Blood Pressure (mmHg): 134/80 Accompanied By: self Schedule Follow-up Appointment: Yes Clinical Summary of Care: Electronic Signature(s) Signed: 08/08/2022 4:50:08 PM By: Massie Kluver Entered By: Massie Kluver on 08/03/2022 09:33:44 [name] ([id number]) -------------------------------------------------------------------------------- Lower Extremity Assessment Details Patient Name: [name] Date of Service: 08/03/2022 8:15 AM Medical Record Number: [id number] Patient Account Number: [id number] Date of Birth/Sex: Jun 19, 1956 (66 y.o. M) Treating RN: Cornell Barman Primary Care : [name] Other Clinician: Massie Kluver Referring : [name] Treating /Extender: Yaakov Guthrie in Treatment: 65 Electronic Signature(s) Signed: 08/03/2022 9:40:21 AM By: Gretta Cool, BSN, RN, CWS, Kim RN, BSN Signed: 08/08/2022 4:50:08 PM By: Massie Kluver Entered By: Massie Kluver on 08/03/2022 08:29:10 [name] ([id number]) -------------------------------------------------------------------------------- Multi Wound Chart  Details Patient Name: [name] Date of Service: 08/03/2022 8:15 AM Medical Record Number: [id number] Patient Account Number: [id number] Date of Birth/Sex: [birth year] (66 y.o. M) Treating RN: Cornell Barman Primary Care : [name] Other Clinician: Massie Kluver Referring : [name] Treating /Extender: Yaakov Guthrie in Treatment: 29 Vital Signs Height(in): 74 Pulse(bpm): 78 Weight(lbs): 226 Blood Pressure(mmHg): 134/80 Body Mass Index(BMI): 29 Temperature(F): 97.9 Respiratory Rate(breaths/min): 16 Photos: [N/A:N/A] Wound Location: Right, Plantar Foot Left, Plantar Foot N/A Wounding Event: Blister Pressure Injury N/A Primary Etiology: Diabetic Wound/Ulcer of the Lower Diabetic Wound/Ulcer of the Lower N/A Extremity Extremity Comorbid History: Type II Diabetes, Osteoarthritis Type II Diabetes, Osteoarthritis N/A Date Acquired: 07/15/2020 07/24/2022 N/A Weeks of Treatment: 38 1 N/A Wound Status: Open Open N/A Wound Recurrence: No No N/A Measurements L x W x D (cm) 1x0.7x0.3 1.2x0.8x1.9 N/A Area (cm) : 0.55 0.754 N/A Volume (cm) : 0.165 1.433 N/A % Reduction in Area: 51.00% -6.60% N/A % Reduction in Volume: 75.50% 45.20% N/A Classification: Grade 2 Grade 3 N/A Exudate Amount: Medium Medium N/A Exudate Type: Serosanguineous Serosanguineous N/A Exudate Color: red, brown red, brown N/A Wound Margin: Thickened Flat and Intact N/A Granulation Amount: Large (67-100%) None Present (0%) N/A Granulation Quality: Red N/A N/A Necrotic Amount: Small (1-33%) Small (1-33%) N/A Exposed Structures: Fat Layer (Subcutaneous Tissue): Fat Layer (Subcutaneous Tissue): N/A Yes Yes Fascia: No Fascia: No Tendon: No Tendon: No Muscle: No Muscle: No Joint: No Joint: No Bone: No Bone: No Epithelialization: None None N/A Treatment Notes Electronic Signature(s) Signed: 08/08/2022 4:50:08 PM By: Massie Kluver Entered By: Massie Kluver on  08/03/2022 08:29:26 [name] ([id number]) -------------------------------------------------------------------------------- [place] Details Patient Name: [name] Date of Service: 08/03/2022 8:15 AM Medical Record Number: [id number] Patient Account Number: [id number] Date of Birth/Sex: Mar 11, 1956 (66 y.o. M) Treating RN: Cornell Barman Primary Care : [name] Other Clinician: Massie Kluver Referring : [name] Treating /Extender: Yaakov Guthrie in Treatment: 52 Active Inactive Wound/Skin Impairment Nursing Diagnoses: Impaired tissue integrity Knowledge deficit related to smoking impact on wound healing Knowledge deficit related to ulceration/compromised skin integrity Goals: Patient/caregiver will verbalize understanding of skin care regimen Date Initiated: 11/10/2021 Date Inactivated: 12/01/2021 Target Resolution Date: 11/17/2021 Goal Status: Met Ulcer/skin breakdown will have a volume reduction of 30% by week 4 Date Initiated: 11/10/2021 Date Inactivated: 02/02/2022 Target Resolution Date: 12/08/2021 Goal Status:  Met Ulcer/skin breakdown will have a volume reduction of 50% by week 8 Date Initiated: 11/10/2021 Target Resolution Date: 01/05/2022 Goal Status: Active Ulcer/skin breakdown will have a volume reduction of 80% by week 12 Date Initiated: 11/10/2021 Target Resolution Date: 02/02/2022 Goal Status: Active Ulcer/skin breakdown will heal within 14 weeks Date Initiated: 11/10/2021 Target Resolution Date: 02/16/2022 Goal Status: Active Interventions: Assess patient/caregiver ability to obtain necessary supplies Assess patient/caregiver ability to perform ulcer/skin care regimen upon admission and as needed Assess ulceration(s) every visit Notes: Electronic Signature(s) Signed: 08/03/2022 9:40:21 AM By: Gretta Cool, BSN, RN, CWS, Kim RN, BSN Signed: 08/08/2022 4:50:08 PM By: Massie Kluver Entered By: Massie Kluver on  08/03/2022 08:29:14 [name] ([id number]) -------------------------------------------------------------------------------- Pain Assessment Details Patient Name: [name] Date of Service: 08/03/2022 8:15 AM Medical Record Number: [id number] Patient Account Number: [id number] Date of Birth/Sex: [birth year] (66 y.o. M) Treating RN: Cornell Barman Primary Care Addyson Traub: [name] Other Clinician: Massie Kluver Referring Keonia Pasko: [name] Treating Jerie Basford/Extender: Yaakov Guthrie in Treatment: 88 Active Problems Location of Pain Severity and Description of Pain Patient Has Paino Yes Site Locations Pain Location: Generalized Pain, Pain in Ulcers Duration of the Pain. Constant / Intermittento Constant Rate the pain. Current Pain Level: 6 Character of Pain Describe the Pain: Aching, Sharp, Throbbing Pain Management and Medication Current Pain Management: Medication: Yes Rest: Yes Electronic Signature(s) Signed: 08/03/2022 9:40:21 AM By: Gretta Cool, BSN, RN, CWS, Kim RN, BSN Signed: 08/08/2022 4:50:08 PM By: Massie Kluver Entered By: Massie Kluver on 08/03/2022 08:18:21 [name] ([id number]) -------------------------------------------------------------------------------- Patient/Caregiver Education Details Patient Name: [name] Date of Service: 08/03/2022 8:15 AM Medical Record Number: [id number] Patient Account Number: [id number] Date of Birth/Gender: 1955/11/04 (66 y.o. M) Treating RN: Cornell Barman Primary Care Physician: [name] Other Clinician: Massie Kluver Referring Physician: [name] Treating Physician/Extender: Yaakov Guthrie in Treatment: 23 Education Assessment Education Provided To: Patient Education Topics Provided Wound/Skin Impairment: Handouts: Other: continue wound care as directed Methods: Explain/Verbal Responses: State content correctly Electronic Signature(s) Signed: 08/08/2022 4:50:08 PM By:  Massie Kluver Entered By: Massie Kluver on 08/03/2022 08:51:35 [name] ([id number]) -------------------------------------------------------------------------------- Wound Assessment Details Patient Name: [name] Date of Service: 08/03/2022 8:15 AM Medical Record Number: [id number] Patient Account Number: [id number] Date of Birth/Sex: [birth year] (66 y.o. M) Treating RN: Cornell Barman Primary Care Erol Flanagin: [name] Other Clinician: Massie Kluver Referring Ahnesti Townsend: [name] Treating Chirstopher Iovino/Extender: Yaakov Guthrie in Treatment: 73 Wound Status Wound Number: 1 Primary Etiology: Diabetic Wound/Ulcer of the Lower Extremity Wound Location: Right, Plantar Foot Wound Status: Open Wounding Event: Blister Comorbid History: Type II Diabetes, Osteoarthritis Date Acquired: 07/15/2020 Weeks Of Treatment: 38 Clustered Wound: No Photos Wound Measurements Length: (cm) 1 Width: (cm) 0.7 Depth: (cm) 0.3 Area: (cm) 0.55 Volume: (cm) 0.165 % Reduction in Area: 51% % Reduction in Volume: 75.5% Epithelialization: None Wound Description Classification: Grade 2 Wound Margin: Thickened Exudate Amount: Medium Exudate Type: Serosanguineous Exudate Color: red, brown Foul Odor After Cleansing: No Slough/Fibrino Yes Wound Bed Granulation Amount: Large (67-100%) Exposed Structure Granulation Quality: Red Fascia Exposed: No Necrotic Amount: Small (1-33%) Fat Layer (Subcutaneous Tissue) Exposed: Yes Necrotic Quality: Adherent Slough Tendon Exposed: No Muscle Exposed: No Joint Exposed: No Bone Exposed: No Treatment Notes Wound #1 (Foot) Wound Laterality: Plantar, Right Cleanser Wound Cleanser Discharge Instruction: Wash your hands with soap and water. Remove old dressing, discard into plastic bag and place into trash. Cleanse the wound with Wound Cleanser prior to applying a clean dressing using gauze sponges, not tissues or  cotton balls. Do not scrub or  use excessive force. Pat dry using gauze sponges, not tissue or cotton balls. JORDYNN, PERRIER ([id number]) Peri-Wound Care Topical Primary Dressing PuraPly Discharge Instruction: #3 Secondary Dressing Zetuvit Plus 4x4 (in/in) Secured With Conform 4'' - Conforming Stretch Gauze Bandage 4x75 (in/in) Discharge Instruction: Apply as directed Compression Wrap Compression Stockings Add-Ons Electronic Signature(s) Signed: 08/03/2022 9:40:21 AM By: Gretta Cool, BSN, RN, CWS, Kim RN, BSN Signed: 08/08/2022 4:50:08 PM By: Massie Kluver Entered By: Massie Kluver on 08/03/2022 08:26:40 [name] ([id number]) -------------------------------------------------------------------------------- Wound Assessment Details Patient Name: [name] Date of Service: 08/03/2022 8:15 AM Medical Record Number: [id number] Patient Account Number: [id number] Date of Birth/Sex: Sep 21, 1956 (66 y.o. M) Treating RN: Cornell Barman Primary Care Tinzley Dalia: [name] Other Clinician: Massie Kluver Referring Henrique Parekh: [name] Treating Margherita Collyer/Extender: Yaakov Guthrie in Treatment: 83 Wound Status Wound Number: 3 Primary Etiology: Diabetic Wound/Ulcer of the Lower Extremity Wound Location: Left, Plantar Foot Wound Status: Open Wounding Event: Pressure Injury Comorbid History: Type II Diabetes, Osteoarthritis Date Acquired: 07/24/2022 Weeks Of Treatment: 1 Clustered Wound: No Photos Wound Measurements Length: (cm) 1.2 % Redu Width: (cm) 0.8 % Redu Depth: (cm) 3.9 Epithe Area: (cm) 0.754 Volume: (cm) 2.941 ction in Area: -6.6% ction in Volume: -12.5% lialization: None Wound Description Classification: Grade 3 Foul Wound Margin: Flat and Intact Sloug Exudate Amount: Medium Exudate Type: Serosanguineous Exudate Color: red, brown Odor After Cleansing: No h/Fibrino Yes Wound Bed Granulation Amount: None Present (0%) Exposed Structure Necrotic Amount: Small (1-33%) Fascia Exposed:  No Fat Layer (Subcutaneous Tissue) Exposed: Yes Tendon Exposed: No Muscle Exposed: No Joint Exposed: No Bone Exposed: No Treatment Notes Wound #3 (Foot) Wound Laterality: Plantar, Left Cleanser Dakin 16 (oz) 0.25 Discharge Instruction: wet to dry dressing Wound Cleanser Earnhart, Derren ([id number]) Discharge Instruction: Wash your hands with soap and water. Remove old dressing, discard into plastic bag and place into trash. Cleanse the wound with Wound Cleanser prior to applying a clean dressing using gauze sponges, not tissues or cotton balls. Do not scrub or use excessive force. Pat dry using gauze sponges, not tissue or cotton balls. Peri-Wound Care Topical Primary Dressing (BORDER) Zetuvit Plus Silicone Border Dressing 4x4 (in/in) Secondary Dressing Secured With Conform 4'' - Conforming Stretch Gauze Bandage 4x75 (in/in) Discharge Instruction: Apply as directed Compression Wrap Compression Stockings Add-Ons Electronic Signature(s) Signed: 08/03/2022 9:40:21 AM By: Gretta Cool, BSN, RN, CWS, Kim RN, BSN Signed: 08/08/2022 4:50:08 PM By: Massie Kluver Entered By: Massie Kluver on 08/03/2022 08:37:57 [name] ([id number]) -------------------------------------------------------------------------------- Vitals Details Patient Name: [name] Date of Service: 08/03/2022 8:15 AM Medical Record Number: [id number] Patient Account Number: [id number] Date of Birth/Sex: 08/08/1956 (66 y.o. M) Treating RN: Cornell Barman Primary Care Hollyann Pablo: [name] Other Clinician: Massie Kluver Referring Shia Eber: [name] Treating Malikah Principato/Extender: Yaakov Guthrie in Treatment: 9 Vital Signs Time Taken: 08:17 Temperature (F): 97.9 Height (in): 74 Pulse (bpm): 78 Weight (lbs): 226 Respiratory Rate (breaths/min): 16 Body Mass Index (BMI): 29 Blood Pressure (mmHg): 134/80 Reference Range: 80 - 120 mg / dl Electronic Signature(s) Signed: 08/08/2022 4:50:08 PM By:  Massie Kluver Entered By: Massie Kluver on 08/03/2022 08:18:01

## 2022-08-08 NOTE — Progress Notes (Addendum)
KAJE, FAYE ([id number]) Visit Report for 08/03/2022 Chief Complaint Document Details Patient Name: [name], [name] Date of Service: 08/03/2022 8:15 AM Medical Record Number: [id number] Patient Account Number: [id number] Date of Birth/Sex: 07-08-1956 (65 y.o. M) Treating RN: Cornell Barman Primary Care Provider: [name] Other Clinician: Massie Kluver Referring Provider: [name] Treating Provider/Extender: Yaakov Guthrie in Treatment: 29 Information Obtained from: Patient Chief Complaint Right plantar foot wound Electronic Signature(s) Signed: 08/03/2022 9:18:31 AM By: Kalman Shan DO Entered By: Kalman Shan on 08/03/2022 08:52:07 [name] ([id number]) -------------------------------------------------------------------------------- Cellular or Tissue Based Product Details Patient Name: [name] Date of Service: 08/03/2022 8:15 AM Medical Record Number: [id number] Patient Account Number: [id number] Date of Birth/Sex: 1956/08/06 (65 y.o. M) Treating RN: Cornell Barman Primary Care Provider: [name] Other Clinician: Massie Kluver Referring Provider: [name] Treating Provider/Extender: Yaakov Guthrie in Treatment: 61 Cellular or Tissue Based Product Type Wound #1 Right,Plantar Foot Applied to: Performed By: Physician Kalman Shan, MD Cellular or Tissue Based Product Puraply AM Type: Level of Consciousness (Pre- Awake and Alert procedure): Pre-procedure Verification/Time Out Yes - 08:44 Taken: Location: genitalia / hands / feet / multiple digits Wound Size (sq cm): 0.7 Product Size (sq cm): 4 Waste Size (sq cm): 2 Waste Reason: wound size Amount of Product Applied (sq cm): 2 Instrument Used: Forceps, Scissors Lot #: [id number] Order #: 3 Expiration Date: 10/17/2024 Fenestrated: Yes Reconstituted: Yes Solution Type: normal saline Solution Amount: 59ml Lot #: [id number] Solution Expiration Date: 01/02/2024 Secured:  Yes Secured With: Steri-Strips Dressing Applied: Yes Primary Dressing: mepitel one Response to Treatment: Procedure was tolerated well Level of Consciousness (Post- Awake and Alert procedure): Post Procedure Diagnosis Same as Pre-procedure Electronic Signature(s) Signed: 08/08/2022 5:56:10 PM By: Gretta Cool, BSN, RN, CWS, Kim RN, BSN Previous Signature: 08/08/2022 5:39:39 PM Version By: Gretta Cool, BSN, RN, CWS, Kim RN, BSN Previous Signature: 08/08/2022 4:50:08 PM Version By: Massie Kluver Entered By: Gretta Cool BSN, RN, CWS, Kim on 08/08/2022 17:56:10 [name] ([id number]) -------------------------------------------------------------------------------- Debridement Details Patient Name: [name] Date of Service: 08/03/2022 8:15 AM Medical Record Number: [id number] Patient Account Number: [id number] Date of Birth/Sex: 04-16-1956 (65 y.o. M) Treating RN: Cornell Barman Primary Care Provider: [name] Other Clinician: Massie Kluver Referring Provider: [name] Treating Provider/Extender: Yaakov Guthrie in Treatment: 38 Debridement Performed for Wound #1 Right,Plantar Foot Assessment: Performed By: Physician Kalman Shan, MD Debridement Type: Debridement Severity of Tissue Pre Debridement: Fat layer exposed Level of Consciousness (Pre- Awake and Alert procedure): Pre-procedure Verification/Time Out Yes - 08:39 Taken: Start Time: 08:39 Total Area Debrided (L x W): 1 (cm) x 1 (cm) = 1 (cm) Tissue and other material Viable, Non-Viable, Callus, Slough, Subcutaneous, Slough debrided: Level: Skin/Subcutaneous Tissue Debridement Description: Excisional Instrument: Curette Bleeding: Minimum Hemostasis Achieved: Pressure Response to Treatment: Procedure was tolerated well Level of Consciousness (Post- Awake and Alert procedure): Post Debridement Measurements of Total Wound Length: (cm) 1 Width: (cm) 0.7 Depth: (cm) 0.3 Volume: (cm) 0.165 Character of  Wound/Ulcer Post Debridement: Stable Severity of Tissue Post Debridement: Fat layer exposed Post Procedure Diagnosis Same as Pre-procedure Electronic Signature(s) Signed: 08/03/2022 9:18:31 AM By: Kalman Shan DO Signed: 08/03/2022 9:40:21 AM By: Gretta Cool, BSN, RN, CWS, Kim RN, BSN Signed: 08/08/2022 4:50:08 PM By: Massie Kluver Entered By: Massie Kluver on 08/03/2022 08:44:09 [name] ([id number]) -------------------------------------------------------------------------------- HPI Details Patient Name: [name] Date of Service: 08/03/2022 8:15 AM Medical Record Number: [id number] Patient Account Number: [id number] Date of Birth/Sex: 11/06/1955 (65 y.o. M) Treating RN: Cornell Barman Primary Care  Provider: [name] Other Clinician: Massie Kluver Referring Provider: [name] Treating Provider/Extender: Yaakov Guthrie in Treatment: 66 History of Present Illness HPI Description: Admission 11/10/2021 Mr. Shepherd Doubleday is a 66 year old male with a past medical history of uncontrolled type 2 diabetes with last hemoglobin 123456 of 8.1 complicated by peripheral neuropathy that presents to the clinic for a 2-year history of nonhealing ulcer to the bottom of his right foot. He states this started out as a blister caused by a work boot. He reports receiving wound care when he resided in [place]. He is reestablishing his wound care in [facility] today. He is currently keeping the area clean and covered. He has insoles designed to help offload the wound bed. He currently denies signs of infection. 1/18; patient presents for follow-up. He has been using Hydrofera Blue to the wound bed. He has no issues or complaints today. He has not received the defender.[name] He denies signs of infection. 2/1; patient presents for follow-up. He has been using Hydrofera Blue to the wound bed he states he received the defender boot and has been using it however he did not have it on  today. He currently denies signs of infection to the right foot. Unfortunately he developed a wound to the left great toe over the past week. He states he received new orthotics and these caused a blister to the left great toe which turned into a wound. He has not been doing anything to the wound bed. He currently denies systemic signs of infection. 2/8; Patient presents for follow-up. 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 He states he is about 90% recovered. He has some mild weakness to the left side. He reports taking the antibiotics prescribed at last clinic visit. He reports using Hydrofera Blue for dressing changes. He only uses the offloading boot when he is at home. He uses open toed shoes to the right foot. He currently denies signs of infection. 2/15; patient presents for follow-up. He states he is still taking the antibiotics prescribed. He reports using the defender boot to the right foot and open toed shoe to the left foot however he is not wearing either today. He currently denies systemic signs of infection. He stated he cut down a tree last week and was outside doing yard work. 2/22; patient presents for follow-up. 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 He reports feeling well. He reports using the defender boot to the right leg and continues to use Hydrofera Blue with dressing changes. He denies systemic signs of infection. 3/8; patient presents for follow-up. He sees Dr. Cleda Mccreedy tomorrow for follow-up of the left great toe amputation. This was a partial amputation. He reports he is taking Augmentin currently. He reports increased erythema to the left great toe amputation site. He also reports a decline in his right plantar foot wound. He reports it is draining more. He denies purulent drainage. 3/15; patient presents for follow-up. He  did not see Dr. Cleda Mccreedy last week but states he sees him today. He states he has been taking doxycycline in the past week and started gentamicin ointment. He continues to use [facility] with dressing changes. He currently denies systemic signs of infection. 3/22; patient presents for follow-up. Patient had partial amputation of the left great toe on 2/15. And had complete amputation of the left  great toe on 01/14/2022 by Dr. Cleda Mccreedy. He reports no issues and has no complaints. He is currently taking Keflex and doxycycline prescribed by Dr. Cleda Mccreedy. He continues to use gentamicin ointment and Hydrofera Blue to the right foot wound. He reports using the defender boot when he is at home. 3/29; patient presents for follow-up. He continues to be on antibiotics per podiatry. He has been using Hydrofera Blue and gentamicin ointment to the right plantar foot wound. He has no issues or complaints today. He denies signs of infection. 4/5; patient presents for follow-up. He has been using Dakin's wet-to-dry dressings with improvement to wound healing. He denies signs of infection. She has no issues or complaints today. 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 Use Hydrofera Blue for a prolonged period of time and has been using Dakin's wet- to-dry I think for the last 2 to 3 weeks. He has a Conservation officer, nature at home but he does not wear that coming into the clinic because he drives. He tells me he has been compliant with the defender boot short of when he goes out to drive. He lives alone. He also tells me he has had a previous left great toe amputation We are following him for the right foot wound. 4/26; patient presents for follow-up. He had worsening of his left foot wound and had surgical debridement on 4/16 by Dr. Gari Crown. He is being followed by podiatry for this issue. He he is on IV Unasyn based on tissue culture by ID.[name] We are following him for the right foot wound. He has been  using Dakin's wet-to-dry dressings without issues. He reports continued usage of the defender boot. 5/3; patient presents for follow-up. He has been using Dakin's wet-to-dry dressings to the right foot wound. He has no issues or complaints today. He reports using the Conservation officer, nature. He reports offloading the foot wound when he is at home by sitting in a recliner. He denies signs of infection. He is still getting IV antibiotics For the left foot wound that is being followed by podiatry. He states the end date is later this month on 5/28. 5/17; patient presents for follow-up. He missed his last clinic appointment. He has been using Dakin's wet-to-dry dressings. He states he is offloading the right foot wound with the defender boot although he does not have this today. He is still on IV antibiotics. He has no issues or complaints today. 5/24; patient presents for follow-up. He has been using Hydrofera Blue to the wound bed without issues. He denies signs of infection. At last clinic [facility] ([id number]) visit he had a wound culture done that detected high levels of Enterococcus faecalis and low levels of Corynebacterium stratum and Staphylococcus epidermidis. [facility]stone antibiotics were ordered. He has not heard from the company yet. 5/31; patient presents for follow-up. He continues to use Hydrofera Blue to the wound bed. He denies signs of infection. Keystone antibiotics were ordered at last clinic visit and he states he called the company to pay for it. He states that it is being shipped. We rediscussed the total contact cast and he declines having this placed today. He states he is wearing the defender boot however he never wears it into the office. 04-06-2022 upon evaluation today patient appears to be doing about the same in regard to his wound. This is not significantly smaller. He is not wearing his offloading boot. We discussed that today he tells me that he wears it "all the time  as he  chuckled and does not have it on during the office visit today. I think it is increasingly obvious that he is just messing around when he says this based on what I am seeing and otherwise I do not know why he would wear it all the time but not to the clinic. Either way my opinion based on what I am seeing currently is simply that the wound does seem to be still having quite a bit of callus buildup around the edges it is also very dry. He does tell me that he has been using the [facility] since he got it and to be honest I think this is good and should hopefully help keep things under control but at the same time I do not see any evidence of active infection. Again the [employer] is a topical antibiotic with multiple compounds to help prevent further infection and worsening overall. 6/14; patient presents for follow-up. He has been using Keystone antibiotics with [employer] daily. He states he is wearing his offloading boot. He does not have this in office today. He is going to the beach for the next 5 days. He states he Will not be using using any offloading device During this time. I again offered the total contact cast however he declined. I did ask him to reconsider for next clinic visit. He currently denies signs of infection. 6/21; patient presents for follow-up. He went to the beach last week. He did not use an offloading device. Wound is slightly deeper today. I again offered the total contact cast but he declined. Currently denies signs of infection. He reports using the [place], hydrofera blue and the Conservation officer, nature. 6/28; patient presents for follow-up. Patient declines total contact cast today. He has been using Keystone and [facility] to the wound bed. It is unclear if he is using the Conservation officer, nature. He would like a letter for the post man to delivers mail to his front door so he does not have to walk down the driveway. 7/5; patient presents for follow-up. He states he is considering  the total contact cast however does not want this placed today. He is actually going to the gym after this appointment. He does not want offloading foot wear for this. He has been using Keystone and [facility] to the wound bed. He denies signs of infection. 7/12; patient presents for follow-up. He is still considering the total contact cast however does not want this today. He has been using Keystone antibiotic and Hydrofera Blue to the wound bed. He denies signs of infection. He has no issues or complaints today. 7/26; patient presents for follow-up. He declines a total contact cast. He has been using his an antibiotic and Hydrofera Blue to the wound bed. He denies systemic signs of infection. He is scheduled to discuss orthotics next week with his podiatrist. 8/2; patient presents for follow-up. He has been using Hydrofera Blue to the wound bed. He has no issues or complaints today. He declines a total contact cast. He states he is getting orthotics today. 8/9; patient presents for follow-up. He has been using Hydrofera Blue to the wound bed. He again declines the total contact cast. He does state that he is considering starting it at the end of the month. He has a vacation coming up and does not want to have it in place for that. 8/16; patient presents for follow-up. He has been using Hydrofera Blue and Keystone to the wound bed. He started taking  Augmentin and doxycycline as prescribed. He denies signs of infection. He is going on his vacation next week and will be back in 2 weeks. He states he is considering the total contact cast for when he comes back from his trip. 8/30; patient present for follow-up. He has been using Hydrofera Blue and Keystone to the wound bed. He continues to take doxycycline and Augmentin without issues. He went on vacation and reports walking for long periods of time without pressure relief to the wound bed. He currently denies systemic signs of infection. 9/6;  patient presents for follow-up. He has been using Hydrofera Blue and Keystone to the wound bed. He is not wearing his offloading boot. He declines a total contact cast. He has finished Augmentin and doxycycline. He had an MRI completed on 07/01/2022 that did not show an underlying abscess, septic arthritis or osteomyelitis. He currently denies systemic signs of infection. 9/13; patient presents for follow-up. Has been using Hydrofera Blue and [facility]stone to the wound bed. He does not present with his offloading boot. He declines the total contact cast. He has been approved for PuraPly. We discussed this today and he would like this placed at next clinic visit. He denies signs of infection. 9/20; patient presents for follow-up. We have been using Hydrofera Blue and Keystone antibiotic ointment. Patient denies signs of infection. PuraPly is available for placement and patient would like to proceed with this. 9/27; patient presents for follow-up. PuraPly #1 was placed in standard fashion to the right foot at last clinic visit. He tolerated this well. Unfortunately has developed a wound to the plantar surface of the left foot. He states he noticed this Sunday. He has pain to the site. He denies purulent drainage increased warmth or erythema. 10/4; patient presents for follow-up. PuraPly #2 was placed in standard fashion to the right foot at last clinic visit. He started taking Augmentin and doxycycline prescribed at last clinic visit due to findings of infection to the left foot. He has been doing Dakin's wet-to-dry to this area. He reports stability in his symptoms. He denies systemic signs of infection. Electronic Signature(s) Signed: 08/03/2022 9:18:31 AM By: Kalman Shan DO Entered By: Kalman Shan on 08/03/2022 08:55:23 [name] ([id number]) -------------------------------------------------------------------------------- Physical Exam Details Patient Name: [name] Date of Service:  08/03/2022 8:15 AM Medical Record Number: [id number] Patient Account Number: [id number] Date of Birth/Sex: 1955-12-25 (65 y.o. M) Treating RN: Cornell Barman Primary Care Provider: [name] Other Clinician: Massie Kluver Referring Provider: [name] Treating Provider/Extender: Yaakov Guthrie in Treatment: 54 Constitutional . Cardiovascular . Psychiatric . Notes Right foot: To the plantar aspect of the first metatarsal head there is an open wound with granulation tissue, Nonviable tissue and callus. There is undermining to the 7o11 o'clock position. No signs of surrounding soft tissue infection. Left foot: To the Third submetatarsal there is an open wound that probes closely to bone. No increased warmth, erythema or purulent drainage. Electronic Signature(s) Signed: 08/03/2022 9:18:31 AM By: Kalman Shan DO Entered By: Kalman Shan on 08/03/2022 08:55:57 [name] ([id number]) -------------------------------------------------------------------------------- Physician Orders Details Patient Name: [name] Date of Service: 08/03/2022 8:15 AM Medical Record Number: [id number] Patient Account Number: [id number] Date of Birth/Sex: 16-Aug-1956 (65 y.o. M) Treating RN: Cornell Barman Primary Care Provider: [name] Other Clinician: Massie Kluver Referring Provider: [name] Treating Provider/Extender: Yaakov Guthrie in Treatment: 42 Verbal / Phone Orders: No Diagnosis Coding Follow-up Appointments o Return Appointment in 1 week. o Nurse Visit as needed Bathing/  Shower/ Hygiene o May shower; gently cleanse wound with antibacterial soap, rinse and pat dry prior to dressing wounds o No tub bath. Anesthetic (Use 'Patient Medications' Section for Anesthetic Order Entry) o Lidocaine applied to wound bed Cellular or Tissue Based Products o Cellular or Tissue Based Product Type: - Puraply application #3 applied right foot order  Grafix pl prime for next visit Edema Control - Lymphedema / Segmental Compressive Device / Other o Elevate, Exercise Daily and Avoid Standing for Long Periods of Time. o Elevate leg(s) parallel to the floor when sitting. o DO YOUR BEST to sleep in the bed at night. DO NOT sleep in your recliner. Long hours of sitting in a recliner leads to swelling of the legs and/or potential wounds on your backside. Off-Loading o Foot Defender o - right foot-keep shin covered-keep pressure off of the wound (Patient is not wear boot to clinic each week. Wearing tennis shoe) Additional Orders / Instructions o Follow Nutritious Diet and Increase Protein Intake - monitor blood sugar to maintain normal limits o Other: - Follow up post surgery for left foot-[facility] handling Medications-Please add to medication list. o P.O. Antibiotics - continue Augmentin and Doxycycline Wound Treatment Wound #1 - Foot Wound Laterality: Plantar, Right Cleanser: Wound Cleanser Every Other Day/30 Days Discharge Instructions: Wash your hands with soap and water. Remove old dressing, discard into plastic bag and place into trash. Cleanse the wound with Wound Cleanser prior to applying a clean dressing using gauze sponges, not tissues or cotton balls. Do not scrub or use excessive force. Pat dry using gauze sponges, not tissue or cotton balls. Primary Dressing: PuraPly Every Other Day/30 Days Discharge Instructions: #3 Secondary Dressing: Zetuvit Plus 4x4 (in/in) (Generic) Every Other Day/30 Days Secured With: Conform 4'' - Conforming Stretch Gauze Bandage 4x75 (in/in) Every Other Day/30 Days Discharge Instructions: Apply as directed Wound #3 - Foot Wound Laterality: Plantar, Left Cleanser: Dakin 16 (oz) 0.25 Every Other Day/30 Days Discharge Instructions: wet to dry dressing Cleanser: Wound Cleanser Every Other Day/30 Days Discharge Instructions: Wash your hands with soap and water. Remove old dressing,  discard into plastic bag and place into trash. Cleanse the wound with Wound Cleanser prior to applying a clean dressing using gauze sponges, not tissues or cotton balls. Do not scrub or use excessive force. Pat dry using gauze sponges, not tissue or cotton balls. GOEFFREY, BERCHTOLD ([id number]) Primary Dressing: (BORDER) Zetuvit Plus Silicone Border Dressing 4x4 (in/in) Every Other Day/30 Days Secured With: Conform 4'' - Conforming Stretch Gauze Bandage 4x75 (in/in) Every Other Day/30 Days Discharge Instructions: Apply as directed Radiology o X-ray, foot - xray left foot Electronic Signature(s) Signed: 08/03/2022 9:18:31 AM By: Kalman Shan DO Entered By: Kalman Shan on 08/03/2022 09:18:13 [name] ([id number]) -------------------------------------------------------------------------------- Problem List Details Patient Name: [name] Date of Service: 08/03/2022 8:15 AM Medical Record Number: [id number] Patient Account Number: [id number] Date of Birth/Sex: 1955-12-16 (65 y.o. M) Treating RN: Cornell Barman Primary Care Provider: [name] Other Clinician: Massie Kluver Referring Provider: [name] Treating Provider/Extender: Yaakov Guthrie in Treatment: 40 Active Problems ICD-10 Encounter Code Description Active Date MDM Diagnosis L97.512 Non-pressure chronic ulcer of other part of right foot with fat layer 11/10/2021 No Yes exposed E11.621 Type 2 diabetes mellitus with foot ulcer 11/10/2021 No Yes E11.40 Type 2 diabetes mellitus with diabetic neuropathy, unspecified 11/10/2021 No Yes R26.89 Other abnormalities of gait and mobility 11/10/2021 No Yes S91.302A Unspecified open wound, left foot, initial encounter 07/27/2022 No Yes Inactive Problems  ICD-10 Code Description Active Date Inactive Date L97.528 Non-pressure chronic ulcer of other part of left foot with other specified 12/01/2021 12/01/2021 severity Resolved Problems Electronic  Signature(s) Signed: 08/03/2022 9:18:31 AM By: Kalman Shan DO Entered By: Kalman Shan on 08/03/2022 08:52:03 [name] ([id number]) -------------------------------------------------------------------------------- Progress Note Details Patient Name: [name] Date of Service: 08/03/2022 8:15 AM Medical Record Number: [id number] Patient Account Number: [id number] Date of Birth/Sex: 1956/07/23 (65 y.o. M) Treating RN: Cornell Barman Primary Care Provider: [name] Other Clinician: Massie Kluver Referring Provider: [name] Treating Provider/Extender: Yaakov Guthrie in Treatment: 32 Subjective Chief Complaint Information obtained from Patient Right plantar foot wound History of Present Illness (HPI) Admission 11/10/2021 Mr. Clark Damuth is a 66 year old male with a past medical history of uncontrolled type 2 diabetes with last hemoglobin 123456 of 8.1 complicated by peripheral neuropathy that presents to the clinic for a 2-year history of nonhealing ulcer to the bottom of his right foot. He states this started out as a blister caused by a work boot. He reports receiving wound care when he resided in [place]. He is reestablishing his wound care in [facility] today. He is currently keeping the area clean and covered. He has insoles designed to help offload the wound bed. He currently denies signs of infection. 1/18; patient presents for follow-up. He has been using Hydrofera Blue to the wound bed. He has no issues or complaints today. He has not received the defender.[name] He denies signs of infection. 2/1; patient presents for follow-up. He has been using Hydrofera Blue to the wound bed he states he received the defender boot and has been using it however he did not have it on today. He currently denies signs of infection to the right foot. Unfortunately he developed a wound to the left great toe over the past week. He states he received new orthotics  and these caused a blister to the left great toe which turned into a wound. He has not been doing anything to the wound bed. He currently denies systemic signs of infection. 2/8; Patient presents for follow-up. 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 He states he is about 90% recovered. He has some mild weakness to the left side. He reports taking the antibiotics prescribed at last clinic visit. He reports using Hydrofera Blue for dressing changes. He only uses the offloading boot when he is at home. He uses open toed shoes to the right foot. He currently denies signs of infection. 2/15; patient presents for follow-up. He states he is still taking the antibiotics prescribed. He reports using the defender boot to the right foot and open toed shoe to the left foot however he is not wearing either today. He currently denies systemic signs of infection. He stated he cut down a tree last week and was outside doing yard work. 2/22; patient presents for follow-up. 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 He reports feeling well. He reports using the defender boot to the right leg and continues to use Hydrofera Blue with dressing changes. He denies systemic signs of infection. 3/8; patient presents for follow-up. He sees Dr. Cleda Mccreedy tomorrow for follow-up of the left great toe amputation. This was a partial amputation. He reports he is taking Augmentin currently. He reports increased erythema to the left great toe amputation site. He also reports a  decline in his right plantar foot wound. He reports it is draining more. He denies purulent drainage. 3/15; patient presents for follow-up. He did not see Dr. Cleda Mccreedy last week but states he sees him today. He states he has been taking doxycycline in the past week and started gentamicin ointment. He continues to use  [facility] with dressing changes. He currently denies systemic signs of infection. 3/22; patient presents for follow-up. Patient had partial amputation of the left great toe on 2/15. 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 Cleda Mccreedy. He reports no issues and has no complaints. He is currently taking Keflex and doxycycline prescribed by Dr. Cleda Mccreedy. He continues to use gentamicin ointment and Hydrofera Blue to the right foot wound. He reports using the defender boot when he is at home. 3/29; patient presents for follow-up. He continues to be on antibiotics per podiatry. He has been using Hydrofera Blue and gentamicin ointment to the right plantar foot wound. He has no issues or complaints today. He denies signs of infection. 4/5; patient presents for follow-up. He has been using Dakin's wet-to-dry dressings with improvement to wound healing. He denies signs of infection. She has no issues or complaints today. 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 Use Hydrofera Blue for a prolonged period of time and has been using Dakin's wet- to-dry I think for the last 2 to 3 weeks. He has a Conservation officer, nature at home but he does not wear that coming into the clinic because he drives. He tells me he has been compliant with the defender boot short of when he goes out to drive. He lives alone. He also tells me he has had a previous left great toe amputation We are following him for the right foot wound. 4/26; patient presents for follow-up. He had worsening of his left foot wound and had surgical debridement on 4/16 by Dr. Gari Crown. He is being followed by podiatry for this issue. He he is on IV Unasyn based on tissue culture by ID.[name] We are following him for the right foot wound. He has been using Dakin's wet-to-dry dressings without issues. He reports continued usage of the defender boot. 5/3; patient presents for follow-up. He has been using Dakin's wet-to-dry  dressings to the right foot wound. He has no issues or complaints today. He reports using the Conservation officer, nature. He reports offloading the foot wound when he is at home by sitting in a recliner. He denies signs of infection. He is still getting IV antibiotics For the left foot wound that is being followed by podiatry. He states the end date is later this month on 5/28. ROLLINS, PUSEY ([id number]) 5/17; patient presents for follow-up. He missed his last clinic appointment. He has been using Dakin's wet-to-dry dressings. He states he is offloading the right foot wound with the defender boot although he does not have this today. He is still on IV antibiotics. He has no issues or complaints today. 5/24; patient presents for follow-up. He has been using Hydrofera Blue to the wound bed without issues. He denies signs of infection. At last clinic visit he had a wound culture done that detected high levels of Enterococcus faecalis and low levels of Corynebacterium stratum and Staphylococcus epidermidis. [facility] antibiotics were ordered. He has not heard from the company yet. 5/31; patient presents for follow-up. He continues to use Hydrofera Blue to the wound bed. He denies signs of infection. [facility]stone antibiotics were ordered at last clinic  visit and he states he called the company to pay for it. He states that it is being shipped. We rediscussed the total contact cast and he declines having this placed today. He states he is wearing the defender boot however he never wears it into the office. 04-06-2022 upon evaluation today patient appears to be doing about the same in regard to his wound. This is not significantly smaller. He is not wearing his offloading boot. We discussed that today he tells me that he wears it "all the time as he chuckled and does not have it on during the office visit today. I think it is increasingly obvious that he is just messing around when he says this based on what I am seeing and  otherwise I do not know why he would wear it all the time but not to the clinic. Either way my opinion based on what I am seeing currently is simply that the wound does seem to be still having quite a bit of callus buildup around the edges it is also very dry. He does tell me that he has been using the [facility] since he got it and to be honest I think this is good and should hopefully help keep things under control but at the same time I do not see any evidence of active infection. Again the [employer] is a topical antibiotic with multiple compounds to help prevent further infection and worsening overall. 6/14; patient presents for follow-up. He has been using Keystone antibiotics with [employer] daily. He states he is wearing his offloading boot. He does not have this in office today. He is going to the beach for the next 5 days. He states he Will not be using using any offloading device During this time. I again offered the total contact cast however he declined. I did ask him to reconsider for next clinic visit. He currently denies signs of infection. 6/21; patient presents for follow-up. He went to the beach last week. He did not use an offloading device. Wound is slightly deeper today. I again offered the total contact cast but he declined. Currently denies signs of infection. He reports using the [place], hydrofera blue and the Conservation officer, nature. 6/28; patient presents for follow-up. Patient declines total contact cast today. He has been using Keystone and [facility] to the wound bed. It is unclear if he is using the Conservation officer, nature. He would like a letter for the post man to delivers mail to his front door so he does not have to walk down the driveway. 7/5; patient presents for follow-up. He states he is considering the total contact cast however does not want this placed today. He is actually going to the gym after this appointment. He does not want offloading foot wear for this. He has  been using Keystone and [facility] to the wound bed. He denies signs of infection. 7/12; patient presents for follow-up. He is still considering the total contact cast however does not want this today. He has been using Keystone antibiotic and Hydrofera Blue to the wound bed. He denies signs of infection. He has no issues or complaints today. 7/26; patient presents for follow-up. He declines a total contact cast. He has been using his an antibiotic and Hydrofera Blue to the wound bed. He denies systemic signs of infection. He is scheduled to discuss orthotics next week with his podiatrist. 8/2; patient presents for follow-up. He has been using Hydrofera Blue to the wound bed. He has no  issues or complaints today. He declines a total contact cast. He states he is getting orthotics today. 8/9; patient presents for follow-up. He has been using Hydrofera Blue to the wound bed. He again declines the total contact cast. He does state that he is considering starting it at the end of the month. He has a vacation coming up and does not want to have it in place for that. 8/16; patient presents for follow-up. He has been using Hydrofera Blue and [facility] to the wound bed. He started taking Augmentin and doxycycline as prescribed. He denies signs of infection. He is going on his vacation next week and will be back in 2 weeks. He states he is considering the total contact cast for when he comes back from his trip. 8/30; patient present for follow-up. He has been using Hydrofera Blue and [facility] to the wound bed. He continues to take doxycycline and Augmentin without issues. He went on vacation and reports walking for long periods of time without pressure relief to the wound bed. He currently denies systemic signs of infection. 9/6; patient presents for follow-up. He has been using Hydrofera Blue and [facility] to the wound bed. He is not wearing his offloading boot. He declines a total contact cast. He has  finished Augmentin and doxycycline. He had an MRI completed on 07/01/2022 that did not show an underlying abscess, septic arthritis or osteomyelitis. He currently denies systemic signs of infection. 9/13; patient presents for follow-up. Has been using Hydrofera Blue and [facility] to the wound bed. He does not present with his offloading boot. He declines the total contact cast. He has been approved for PuraPly. We discussed this today and he would like this placed at next clinic visit. He denies signs of infection. 9/20; patient presents for follow-up. We have been using Hydrofera Blue and [facility] antibiotic ointment. Patient denies signs of infection. PuraPly is available for placement and patient would like to proceed with this. 9/27; patient presents for follow-up. PuraPly #1 was placed in standard fashion to the right foot at last clinic visit. He tolerated this well. Unfortunately has developed a wound to the plantar surface of the left foot. He states he noticed this Sunday. He has pain to the site. He denies purulent drainage increased warmth or erythema. 10/4; patient presents for follow-up. PuraPly #2 was placed in standard fashion to the right foot at last clinic visit. He started taking Augmentin and doxycycline prescribed at last clinic visit due to findings of infection to the left foot. He has been doing Dakin's wet-to-dry to this area. He reports stability in his symptoms. He denies systemic signs of infection. Mikels, Tayshun ([id number]) Objective Constitutional Vitals Time Taken: 8:17 AM, Height: 74 in, Weight: 226 lbs, BMI: 29, Temperature: 97.9 F, Pulse: 78 bpm, Respiratory Rate: 16 breaths/min, Blood Pressure: 134/80 mmHg. General Notes: Right foot: To the plantar aspect of the first metatarsal head there is an open wound with granulation tissue, Nonviable tissue and callus. There is undermining to the 7 11 o'clock position. No signs of surrounding soft tissue infection. Left  foot: To the Third submetatarsal there is an open wound that probes closely to bone. No increased warmth, erythema or purulent drainage. Integumentary (Hair, Skin) Wound #1 status is Open. Original cause of wound was Blister. The date acquired was: 07/15/2020. The wound has been in treatment 38 weeks. The wound is located on the Right,Plantar Foot. The wound measures 1cm length x 0.7cm width x 0.3cm depth; 0.55cm^2 area and 0.165cm^3  volume. There is Fat Layer (Subcutaneous Tissue) exposed. There is a medium amount of serosanguineous drainage noted. The wound margin is thickened. There is large (67-100%) red granulation within the wound bed. There is a small (1-33%) amount of necrotic tissue within the wound bed including Adherent Slough. Wound #3 status is Open. Original cause of wound was Pressure Injury. The date acquired was: 07/24/2022. The wound has been in treatment 1 weeks. The wound is located on the [place]. The wound measures 1.2cm length x 0.8cm width x 3.9cm depth; 0.754cm^2 area and 2.941cm^3 volume. There is Fat Layer (Subcutaneous Tissue) exposed. There is a medium amount of serosanguineous drainage noted. The wound margin is flat and intact. There is no granulation within the wound bed. There is a small (1-33%) amount of necrotic tissue within the wound bed. 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 I debrided nonviable tissue to the right foot wound PuraPly #3 was placed in standard fashion to the right foot. The wound has remained stable since starting the skin substitute. I think he would benefit from a change here. He was approved for Grafix and we will order this for next  clinic visit. The left foot wound is stable. The depth is still around 3.9 cm. He is still taking Augmentin and doxycycline and has 1 more week of this left. I recommended he continue this. Since there has not been a dramatic change with antibiotics we will go ahead and get an x-ray. Continue aggressive offloading. It is unclear if he is truly doing this. He does have a Conservation officer, nature. Follow-up in 1 week. Procedures Wound #1 Pre-procedure diagnosis of Wound #1 is a Diabetic Wound/Ulcer of the Lower Extremity located on the [place] .Severity of Tissue Pre Debridement is: Fat layer exposed. There was a Excisional Skin/Subcutaneous Tissue Debridement with a total area of 1 sq cm performed by Kalman Shan, MD. With the following instrument(s): Curette to remove Viable and Non-Viable tissue/material. Material removed includes Callus, Subcutaneous Tissue, and Slough. A time out was conducted at 08:39, prior to the start of the procedure. A Minimum amount of bleeding was controlled with Pressure. The procedure was tolerated well. Post Debridement Measurements: 1cm length x 0.7cm width x 0.3cm depth; 0.165cm^3 volume. Character of Wound/Ulcer Post Debridement is stable. Severity of Tissue Post Debridement is: Fat layer exposed. Post procedure Diagnosis Wound #1: Same as Pre-Procedure Pre-procedure diagnosis of Wound #1 is a Diabetic Wound/Ulcer of the Lower Extremity located on the Right,Plantar Foot. A skin graft procedure using a bioengineered skin substitute/cellular or tissue based product was performed by Kalman Shan, MD with the following instrument(s): Forceps and Scissors. Puraply AM was applied and secured with Steri-Strips. 2 sq cm of product was utilized and 2 sq cm was wasted due to wound size. Post Application, bolus was applied. A Time Out was conducted at 08:44, prior to the start of the procedure. The procedure was tolerated well. Post procedure Diagnosis Wound #1: Same  as Pre-Procedure . HAYNE, SCHAVER ([id number]) Plan Follow-up Appointments: Return Appointment in 1 week. Nurse Visit as needed Bathing/ Shower/ Hygiene: May shower; gently cleanse wound with antibacterial soap, rinse and pat dry prior to dressing wounds No tub bath. Anesthetic (Use 'Patient Medications' Section for Anesthetic Order Entry):  Lidocaine applied to wound bed Cellular or Tissue Based Products: Cellular or Tissue Based Product Type: - Puraply application #3 applied right foot order Grafix pl prime for next visit Edema Control - Lymphedema / Segmental Compressive Device / Other: Elevate, Exercise Daily and Avoid Standing for Long Periods of Time. Elevate leg(s) parallel to the floor when sitting. DO YOUR BEST to sleep in the bed at night. DO NOT sleep in your recliner. Long hours of sitting in a recliner leads to swelling of the legs and/or potential wounds on your backside. Off-Loading: Foot Defender  - right foot-keep shin covered-keep pressure off of the wound (Patient is not wear boot to clinic each week. Wearing tennis shoe) Additional Orders / Instructions: Follow Nutritious Diet and Increase Protein Intake - monitor blood sugar to maintain normal limits Other: - Follow up post surgery for left foot-[facility] handling Medications-Please add to medication list.: P.O. Antibiotics - continue Augmentin and Doxycycline Radiology ordered were: X-ray, foot - xray left foot WOUND #1: - Foot Wound Laterality: Plantar, Right Cleanser: Wound Cleanser Every Other Day/30 Days Discharge Instructions: Wash your hands with soap and water. Remove old dressing, discard into plastic bag and place into trash. Cleanse the wound with Wound Cleanser prior to applying a clean dressing using gauze sponges, not tissues or cotton balls. Do not scrub or use excessive force. Pat dry using gauze sponges, not tissue or cotton balls. Primary Dressing: PuraPly Every Other Day/30  Days Discharge Instructions: #3 Secondary Dressing: Zetuvit Plus 4x4 (in/in) (Generic) Every Other Day/30 Days Secured With: Conform 4'' - Conforming Stretch Gauze Bandage 4x75 (in/in) Every Other Day/30 Days Discharge Instructions: Apply as directed WOUND #3: - Foot Wound Laterality: Plantar, Left Cleanser: Dakin 16 (oz) 0.25 Every Other Day/30 Days Discharge Instructions: wet to dry dressing Cleanser: Wound Cleanser Every Other Day/30 Days Discharge Instructions: Wash your hands with soap and water. Remove old dressing, discard into plastic bag and place into trash. Cleanse the wound with Wound Cleanser prior to applying a clean dressing using gauze sponges, not tissues or cotton balls. Do not scrub or use excessive force. Pat dry using gauze sponges, not tissue or cotton balls. Primary Dressing: (BORDER) Zetuvit Plus Silicone Border Dressing 4x4 (in/in) Every Other Day/30 Days Secured With: Conform 4'' - Conforming Stretch Gauze Bandage 4x75 (in/in) Every Other Day/30 Days Discharge Instructions: Apply as directed 1. In office sharp debridement 2. PuraPly #3 placed in standard fashion to the right foot 3. Continue antibiotics 4. X-ray of the left foot 5. Follow-up in 1 week Electronic Signature(s) Signed: 08/03/2022 9:18:31 AM By: Kalman Shan DO Entered By: Kalman Shan on 08/03/2022 09:17:41 [name] ([id number]) -------------------------------------------------------------------------------- SuperBill Details Patient Name: [name] Date of Service: 08/03/2022 Medical Record Number: [id number] Patient Account Number: [id number] Date of Birth/Sex: 02/16/1956 (65 y.o. M) Treating RN: Cornell Barman Primary Care Provider: [name] Other Clinician: Massie Kluver Referring Provider: [name] Treating Provider/Extender: Yaakov Guthrie in Treatment: 60 Diagnosis Coding ICD-10 Codes Code Description [phone] Non-pressure chronic ulcer of other part  of right foot with fat layer exposed E11.621 Type 2 diabetes mellitus with foot ulcer E11.40 Type 2 diabetes mellitus with diabetic neuropathy, unspecified R26.89 Other abnormalities of gait and mobility S91.302A Unspecified open wound, left foot, initial encounter Facility Procedures CPT4 Code: [id number] Description: Q7619 PuraPly Product AM 2X2 (4sq CM) Modifier: Quantity: 4 CPT4 Code: [id number] Description: 24580 - SKIN SUB GRAFT FACE/NK/HF/G Modifier: Quantity: 1 CPT4 Code: Description: ICD-10 Diagnosis Description E11.621 Type 2 diabetes  mellitus with foot ulcer Modifier: Quantity: Physician Procedures CPT4 Code: [id number] Description: [id number] - WC PHYS SKIN SUB GRAFT FACE/NK/HF/G Modifier: Quantity: 1 CPT4 Code: Description: ICD-10 Diagnosis Description E11.621 Type 2 diabetes mellitus with foot ulcer Modifier: Quantity: Electronic Signature(s) Signed: 08/08/2022 5:57:11 PM By: Gretta Cool, BSN, RN, CWS, Kim RN, BSN Previous Signature: 08/08/2022 5:53:47 PM Version By: Gretta Cool, BSN, RN, CWS, Kim RN, BSN Previous Signature: 08/05/2022 9:13:45 AM Version By: Kalman Shan DO Previous Signature: 08/08/2022 4:50:08 PM Version By: Massie Kluver Previous Signature: 08/03/2022 9:18:31 AM Version By: Kalman Shan DO Entered By: Gretta Cool BSN, RN, CWS, Kim on 08/08/2022 17:57:10

## 2022-08-10 ENCOUNTER — Encounter (HOSPITAL_BASED_OUTPATIENT_CLINIC_OR_DEPARTMENT_OTHER): Payer: Medicare Other | Admitting: Internal Medicine

## 2022-08-10 DIAGNOSIS — E11621 Type 2 diabetes mellitus with foot ulcer: Secondary | ICD-10-CM

## 2022-08-10 DIAGNOSIS — L97512 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ed: Secondary | ICD-10-CM | POA: Diagnosis not present

## 2022-08-10 DIAGNOSIS — S91302A Unspecified open wound, left foot, initial encounter: Secondary | ICD-10-CM

## 2022-08-10 DIAGNOSIS — M79672 Pain in left foot: Secondary | ICD-10-CM

## 2022-08-15 ENCOUNTER — Ambulatory Visit (INDEPENDENT_AMBULATORY_CARE_PROVIDER_SITE_OTHER): Payer: Medicare Other

## 2022-08-15 DIAGNOSIS — M2041 Other hammer toe(s) (acquired), right foot: Secondary | ICD-10-CM | POA: Diagnosis not present

## 2022-08-15 DIAGNOSIS — M2042 Other hammer toe(s) (acquired), left foot: Secondary | ICD-10-CM

## 2022-08-15 DIAGNOSIS — E119 Type 2 diabetes mellitus without complications: Secondary | ICD-10-CM | POA: Diagnosis not present

## 2022-08-15 NOTE — Progress Notes (Signed)
Patient presents today to pick up diabetic shoe prescribed by Dr. Posey Pronto.   Diabetic shoes and inserts were dispensed and fit was satisfactory. Reviewed instructions for break-in and wear. Written instructions given to patient.  Patient will follow up as needed.

## 2022-08-17 ENCOUNTER — Encounter (HOSPITAL_BASED_OUTPATIENT_CLINIC_OR_DEPARTMENT_OTHER): Payer: Medicare Other | Admitting: Internal Medicine

## 2022-08-17 ENCOUNTER — Inpatient Hospital Stay
Admission: EM | Admit: 2022-08-17 | Discharge: 2022-08-19 | DRG: 617 | Disposition: A | Discharge status: Home / Self Care | Payer: Medicare Other | Attending: Internal Medicine | Admitting: Internal Medicine

## 2022-08-17 ENCOUNTER — Encounter: Payer: Self-pay | Admitting: Emergency Medicine

## 2022-08-17 ENCOUNTER — Observation Stay: Payer: Medicare Other

## 2022-08-17 ENCOUNTER — Other Ambulatory Visit: Payer: Self-pay | Admitting: Internal Medicine

## 2022-08-17 ENCOUNTER — Other Ambulatory Visit: Payer: Self-pay

## 2022-08-17 DIAGNOSIS — E1169 Type 2 diabetes mellitus with other specified complication: Principal | ICD-10-CM | POA: Diagnosis present

## 2022-08-17 DIAGNOSIS — M109 Gout, unspecified: Secondary | ICD-10-CM | POA: Diagnosis present

## 2022-08-17 DIAGNOSIS — L97428 Non-pressure chronic ulcer of left heel and midfoot with other specified severity: Secondary | ICD-10-CM | POA: Diagnosis present

## 2022-08-17 DIAGNOSIS — Z87891 Personal history of nicotine dependence: Secondary | ICD-10-CM

## 2022-08-17 DIAGNOSIS — L97528 Non-pressure chronic ulcer of other part of left foot with other specified severity: Secondary | ICD-10-CM

## 2022-08-17 DIAGNOSIS — N1831 Chronic kidney disease, stage 3a: Secondary | ICD-10-CM | POA: Diagnosis present

## 2022-08-17 DIAGNOSIS — L089 Local infection of the skin and subcutaneous tissue, unspecified: Secondary | ICD-10-CM | POA: Diagnosis not present

## 2022-08-17 DIAGNOSIS — E11621 Type 2 diabetes mellitus with foot ulcer: Secondary | ICD-10-CM | POA: Diagnosis present

## 2022-08-17 DIAGNOSIS — E785 Hyperlipidemia, unspecified: Secondary | ICD-10-CM | POA: Diagnosis present

## 2022-08-17 DIAGNOSIS — Z89412 Acquired absence of left great toe: Secondary | ICD-10-CM | POA: Diagnosis not present

## 2022-08-17 DIAGNOSIS — R2689 Other abnormalities of gait and mobility: Secondary | ICD-10-CM | POA: Diagnosis not present

## 2022-08-17 DIAGNOSIS — L97512 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ed: Secondary | ICD-10-CM | POA: Diagnosis not present

## 2022-08-17 DIAGNOSIS — F32A Depression, unspecified: Secondary | ICD-10-CM | POA: Diagnosis present

## 2022-08-17 DIAGNOSIS — M79672 Pain in left foot: Secondary | ICD-10-CM

## 2022-08-17 DIAGNOSIS — S91302A Unspecified open wound, left foot, initial encounter: Secondary | ICD-10-CM

## 2022-08-17 DIAGNOSIS — M86472 Chronic osteomyelitis with draining sinus, left ankle and foot: Secondary | ICD-10-CM | POA: Diagnosis present

## 2022-08-17 DIAGNOSIS — M86172 Other acute osteomyelitis, left ankle and foot: Secondary | ICD-10-CM | POA: Diagnosis not present

## 2022-08-17 DIAGNOSIS — I5032 Chronic diastolic (congestive) heart failure: Secondary | ICD-10-CM | POA: Diagnosis present

## 2022-08-17 DIAGNOSIS — I639 Cerebral infarction, unspecified: Secondary | ICD-10-CM | POA: Diagnosis present

## 2022-08-17 DIAGNOSIS — E1142 Type 2 diabetes mellitus with diabetic polyneuropathy: Secondary | ICD-10-CM | POA: Diagnosis not present

## 2022-08-17 DIAGNOSIS — E1122 Type 2 diabetes mellitus with diabetic chronic kidney disease: Secondary | ICD-10-CM | POA: Diagnosis present

## 2022-08-17 DIAGNOSIS — I130 Hypertensive heart and chronic kidney disease with heart failure and stage 1 through stage 4 chronic kidney disease, or unspecified chronic kidney disease: Secondary | ICD-10-CM | POA: Diagnosis present

## 2022-08-17 DIAGNOSIS — E11628 Type 2 diabetes mellitus with other skin complications: Secondary | ICD-10-CM

## 2022-08-17 DIAGNOSIS — Z8673 Personal history of transient ischemic attack (TIA), and cerebral infarction without residual deficits: Secondary | ICD-10-CM

## 2022-08-17 DIAGNOSIS — E1129 Type 2 diabetes mellitus with other diabetic kidney complication: Secondary | ICD-10-CM | POA: Diagnosis present

## 2022-08-17 DIAGNOSIS — M86272 Subacute osteomyelitis, left ankle and foot: Secondary | ICD-10-CM | POA: Diagnosis not present

## 2022-08-17 DIAGNOSIS — E1140 Type 2 diabetes mellitus with diabetic neuropathy, unspecified: Secondary | ICD-10-CM | POA: Diagnosis not present

## 2022-08-17 DIAGNOSIS — E1165 Type 2 diabetes mellitus with hyperglycemia: Secondary | ICD-10-CM | POA: Diagnosis not present

## 2022-08-17 DIAGNOSIS — Z79899 Other long term (current) drug therapy: Secondary | ICD-10-CM | POA: Diagnosis not present

## 2022-08-17 DIAGNOSIS — X58XXXA Exposure to other specified factors, initial encounter: Secondary | ICD-10-CM | POA: Diagnosis not present

## 2022-08-17 DIAGNOSIS — Z7984 Long term (current) use of oral hypoglycemic drugs: Secondary | ICD-10-CM | POA: Diagnosis not present

## 2022-08-17 LAB — COMPREHENSIVE METABOLIC PANEL
ALT: 13 U/L (ref 0–44)
AST: 14 U/L — ABNORMAL LOW (ref 15–41)
Albumin: 3.9 g/dL (ref 3.5–5.0)
Alkaline Phosphatase: 77 U/L (ref 38–126)
Anion gap: 9 (ref 5–15)
BUN: 24 mg/dL — ABNORMAL HIGH (ref 8–23)
CO2: 27 mmol/L (ref 22–32)
Calcium: 9.9 mg/dL (ref 8.9–10.3)
Chloride: 103 mmol/L (ref 98–111)
Creatinine, Ser: 1.42 mg/dL — ABNORMAL HIGH (ref 0.61–1.24)
GFR, Estimated: 54 mL/min — ABNORMAL LOW (ref >60)
Glucose, Bld: 149 mg/dL — ABNORMAL HIGH (ref 70–99)
Potassium: 4.5 mmol/L (ref 3.5–5.1)
Sodium: 139 mmol/L (ref 135–145)
Total Bilirubin: 0.7 mg/dL (ref 0.3–1.2)
Total Protein: 8.5 g/dL — ABNORMAL HIGH (ref 6.5–8.1)

## 2022-08-17 LAB — CBC WITH DIFFERENTIAL/PLATELET
Abs Immature Granulocytes: 0.02 10*3/uL (ref 0.00–0.07)
Basophils Absolute: 0.1 10*3/uL (ref 0.0–0.1)
Basophils Relative: 1 %
Eosinophils Absolute: 0.3 10*3/uL (ref 0.0–0.5)
Eosinophils Relative: 4 %
HCT: 42.8 % (ref 39.0–52.0)
Hemoglobin: 13.3 g/dL (ref 13.0–17.0)
Immature Granulocytes: 0 %
Lymphocytes Relative: 21 %
Lymphs Abs: 1.7 10*3/uL (ref 0.7–4.0)
MCH: 26.8 pg (ref 26.0–34.0)
MCHC: 31.1 g/dL (ref 30.0–36.0)
MCV: 86.1 fL (ref 80.0–100.0)
Monocytes Absolute: 0.5 10*3/uL (ref 0.1–1.0)
Monocytes Relative: 7 %
Neutro Abs: 5.2 10*3/uL (ref 1.7–7.7)
Neutrophils Relative %: 67 %
Platelets: 313 10*3/uL (ref 150–400)
RBC: 4.97 MIL/uL (ref 4.22–5.81)
RDW: 18.4 % — ABNORMAL HIGH (ref 11.5–15.5)
WBC: 7.8 10*3/uL (ref 4.0–10.5)
nRBC: 0 % (ref 0.0–0.2)

## 2022-08-17 LAB — PROTIME-INR
INR: 1 (ref 0.8–1.2)
Prothrombin Time: 13.1 seconds (ref 11.4–15.2)

## 2022-08-17 LAB — CBG MONITORING, ED: Glucose-Capillary: 117 mg/dL — ABNORMAL HIGH (ref 70–99)

## 2022-08-17 LAB — GLUCOSE, CAPILLARY
Glucose-Capillary: 148 mg/dL — ABNORMAL HIGH (ref 70–99)
Glucose-Capillary: 145 mg/dL — ABNORMAL HIGH (ref 70–99)

## 2022-08-17 LAB — APTT: aPTT: 40 seconds — ABNORMAL HIGH (ref 24–36)

## 2022-08-17 LAB — BRAIN NATRIURETIC PEPTIDE: B Natriuretic Peptide: 23.1 pg/mL (ref 0.0–100.0)

## 2022-08-17 LAB — SEDIMENTATION RATE: Sed Rate: 65 mm/hr — ABNORMAL HIGH (ref 0–20)

## 2022-08-17 LAB — LACTIC ACID, PLASMA: Lactic Acid, Venous: 1.6 mmol/L (ref 0.5–1.9)

## 2022-08-17 MED ORDER — ALLOPURINOL 300 MG PO TABS
300.0000 mg | ORAL_TABLET | Freq: Every day | ORAL | Status: DC
  Administered 2022-08-19: 300 mg via ORAL
  Filled 2022-08-17: qty 1

## 2022-08-17 MED ORDER — METRONIDAZOLE 500 MG/100ML IV SOLN
500.0000 mg | Freq: Two times a day (BID) | INTRAVENOUS | Status: DC
  Administered 2022-08-17 – 2022-08-19 (×4): 500 mg via INTRAVENOUS
  Filled 2022-08-17 (×4): qty 100

## 2022-08-17 MED ORDER — SODIUM CHLORIDE 0.9 % IV SOLN
2.0000 g | INTRAVENOUS | Status: DC
  Administered 2022-08-17 – 2022-08-19 (×3): 2 g via INTRAVENOUS
  Filled 2022-08-17: qty 2
  Filled 2022-08-17 (×2): qty 20

## 2022-08-17 MED ORDER — MORPHINE SULFATE (PF) 2 MG/ML IV SOLN
2.0000 mg | Freq: Once | INTRAVENOUS | Status: AC
  Administered 2022-08-17: 2 mg via INTRAVENOUS
  Filled 2022-08-17: qty 1

## 2022-08-17 MED ORDER — SODIUM CHLORIDE 0.9 % IV SOLN: 2.0000 g | Freq: Once | INTRAVENOUS | Status: DC

## 2022-08-17 MED ORDER — VANCOMYCIN HCL 2000 MG/400ML IV SOLN
2000.0000 mg | INTRAVENOUS | Status: DC
  Administered 2022-08-18: 2000 mg via INTRAVENOUS
  Filled 2022-08-17: qty 400

## 2022-08-17 MED ORDER — ACETAMINOPHEN 500 MG PO TABS
1000.0000 mg | ORAL_TABLET | Freq: Once | ORAL | Status: AC
Start: 2022-08-17 — End: 2022-08-17
  Administered 2022-08-17: 1000 mg via ORAL
  Filled 2022-08-17: qty 2

## 2022-08-17 MED ORDER — METRONIDAZOLE 500 MG/100ML IV SOLN
500.0000 mg | Freq: Once | INTRAVENOUS | Status: AC
  Administered 2022-08-17: 500 mg via INTRAVENOUS
  Filled 2022-08-17: qty 100

## 2022-08-17 MED ORDER — LISINOPRIL 5 MG PO TABS
5.0000 mg | ORAL_TABLET | Freq: Every day | ORAL | Status: DC
  Administered 2022-08-19: 5 mg via ORAL
  Filled 2022-08-17: qty 1

## 2022-08-17 MED ORDER — ACETAMINOPHEN 325 MG PO TABS: 650.0000 mg | ORAL_TABLET | Freq: Four times a day (QID) | ORAL | Status: DC | PRN

## 2022-08-17 MED ORDER — ONDANSETRON HCL 4 MG/2ML IJ SOLN: 4.0000 mg | Freq: Three times a day (TID) | INTRAMUSCULAR | Status: DC | PRN

## 2022-08-17 MED ORDER — OXYCODONE-ACETAMINOPHEN 5-325 MG PO TABS
1.0000 | ORAL_TABLET | ORAL | Status: DC | PRN
  Administered 2022-08-17 – 2022-08-19 (×4): 1 via ORAL
  Filled 2022-08-17 (×4): qty 1

## 2022-08-17 MED ORDER — SODIUM CHLORIDE 0.9 % IV SOLN: INTRAVENOUS | Status: DC

## 2022-08-17 MED ORDER — VANCOMYCIN HCL 2000 MG/400ML IV SOLN
2000.0000 mg | Freq: Once | INTRAVENOUS | Status: AC
  Administered 2022-08-17: 2000 mg via INTRAVENOUS
  Filled 2022-08-17: qty 400

## 2022-08-17 MED ORDER — SODIUM CHLORIDE 0.9 % IV BOLUS
1000.0000 mL | Freq: Once | INTRAVENOUS | Status: AC
  Administered 2022-08-17: 1000 mL via INTRAVENOUS

## 2022-08-17 MED ORDER — INSULIN ASPART 100 UNIT/ML IJ SOLN
0.0000 [IU] | Freq: Every day | INTRAMUSCULAR | Status: DC
  Administered 2022-08-18: 2 [IU] via SUBCUTANEOUS
  Filled 2022-08-17: qty 1

## 2022-08-17 MED ORDER — ATORVASTATIN CALCIUM 20 MG PO TABS
80.0000 mg | ORAL_TABLET | Freq: Every day | ORAL | Status: DC
  Administered 2022-08-17 – 2022-08-19 (×2): 80 mg via ORAL
  Filled 2022-08-17 (×2): qty 4

## 2022-08-17 MED ORDER — QUETIAPINE FUMARATE 25 MG PO TABS
25.0000 mg | ORAL_TABLET | Freq: Every day | ORAL | Status: DC
  Administered 2022-08-17 – 2022-08-18 (×2): 25 mg via ORAL
  Filled 2022-08-17 (×2): qty 1

## 2022-08-17 MED ORDER — ROPINIROLE HCL 1 MG PO TABS
0.5000 mg | ORAL_TABLET | Freq: Every day | ORAL | Status: DC
  Administered 2022-08-17 – 2022-08-18 (×2): 0.5 mg via ORAL
  Filled 2022-08-17 (×2): qty 1

## 2022-08-17 MED ORDER — INSULIN ASPART 100 UNIT/ML IJ SOLN
0.0000 [IU] | Freq: Three times a day (TID) | INTRAMUSCULAR | Status: DC
  Administered 2022-08-17: 1 [IU] via SUBCUTANEOUS
  Administered 2022-08-18: 2 [IU] via SUBCUTANEOUS
  Administered 2022-08-18: 5 [IU] via SUBCUTANEOUS
  Administered 2022-08-18: 1 [IU] via SUBCUTANEOUS
  Administered 2022-08-19: 2 [IU] via SUBCUTANEOUS
  Administered 2022-08-19: 3 [IU] via SUBCUTANEOUS
  Filled 2022-08-17 (×6): qty 1

## 2022-08-17 MED ORDER — HEPARIN SODIUM (PORCINE) 5000 UNIT/ML IJ SOLN
5000.0000 [IU] | Freq: Three times a day (TID) | INTRAMUSCULAR | Status: DC
  Administered 2022-08-18 – 2022-08-19 (×2): 5000 [IU] via SUBCUTANEOUS
  Filled 2022-08-17 (×2): qty 1

## 2022-08-17 MED ORDER — HYDRALAZINE HCL 20 MG/ML IJ SOLN: 5.0000 mg | INTRAMUSCULAR | Status: DC | PRN

## 2022-08-17 MED ORDER — VANCOMYCIN HCL IN DEXTROSE 1-5 GM/200ML-% IV SOLN: 1000.0000 mg | Freq: Once | INTRAVENOUS | Status: DC

## 2022-08-17 MED ORDER — GADOBUTROL 1 MMOL/ML IV SOLN
10.0000 mL | Freq: Once | INTRAVENOUS | Status: AC | PRN
  Administered 2022-08-17: 10 mL via INTRAVENOUS

## 2022-08-17 MED ORDER — GABAPENTIN 400 MG PO CAPS
400.0000 mg | ORAL_CAPSULE | Freq: Three times a day (TID) | ORAL | Status: DC
  Administered 2022-08-17 – 2022-08-19 (×5): 400 mg via ORAL
  Filled 2022-08-17 (×5): qty 1

## 2022-08-17 NOTE — Assessment & Plan Note (Signed)
-   Continue home medications: Requip, Seroquel

## 2022-08-17 NOTE — Assessment & Plan Note (Signed)
Continue home allopurinol 

## 2022-08-17 NOTE — Assessment & Plan Note (Signed)
Baseline creatinine 1.1-1.5 recently.  His creatinine is 1.02, BUN 24, GFR 54.  Stable. -Monitor renal function by BMP

## 2022-08-17 NOTE — Assessment & Plan Note (Signed)
lipitor

## 2022-08-17 NOTE — ED Provider Notes (Signed)
[facility] Provider Note    Event Date/Time   First MD Initiated Contact with Patient 08/17/22 1013     (approximate)   History   Chief Complaint: Foot Pain   HPI  [name] is a 66 y.o. male with a history of diabetes, CKD, prior stroke who comes to the ED complaining of left foot pain redness and swelling for the past 2 weeks.  He reports being on amoxicillin and ciprofloxacin, and getting regular wound care but ulceration on the foot is not improving.  Reports that he was told by podiatry to come to the ED for IV antibiotics and MRI.  He denies chest pain or shortness of breath, no fever.  Also reports pain in the right foot from a chronic wound which is being managed by wound care.  He states that he currently has a graft bandage on there that cannot be removed for the next few weeks and he has no concerns about the right foot right now.     Physical Exam   Triage Vital Signs: ED Triage Vitals  Enc Vitals Group     BP 08/17/22 0935 112/78     Pulse Rate 08/17/22 0935 97     Resp 08/17/22 0935 16     Temp 08/17/22 0935 98.4 F (36.9 C)     Temp Source 08/17/22 0935 Oral     SpO2 08/17/22 0935 98 %     Weight 08/17/22 0946 225 lb (102.1 kg)     Height 08/17/22 0946 6\' 2"  (1.88 m)     Head Circumference --      Peak Flow --      Pain Score 08/17/22 0946 6     Pain Loc --      Pain Edu? --      Excl. in GC? --     Most recent vital signs: Vitals:   08/17/22 0935  BP: 112/78  Pulse: 97  Resp: 16  Temp: 98.4 F (36.9 C)  SpO2: 98%    General: Awake, no distress.  CV:  Good peripheral perfusion.  Regular rate and rhythm.  Normal DP pulse, normal capillary refill. Resp:  Normal effort.  Abd:  No distention.  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  There is a plantar ulceration at the base of the second toe.  There is widespread erythema, swelling, tenderness, warmth of the midfoot and distally.  No crepitus.  No  purulent drainage.   ED Results / Procedures / Treatments   Labs (all labs ordered are listed, but only abnormal results are displayed) Labs Reviewed  COMPREHENSIVE METABOLIC PANEL - Abnormal; Notable for the following components:      Result Value   Glucose, Bld 149 (*)    BUN 24 (*)    Creatinine, Ser 1.42 (*)    Total Protein 8.5 (*)    AST 14 (*)    GFR, Estimated 54 (*)    All other components within normal limits  CBC WITH DIFFERENTIAL/PLATELET - Abnormal; Notable for the following components:   RDW 18.4 (*)    All other components within normal limits  LACTIC ACID, PLASMA  LACTIC ACID, PLASMA     EKG    RADIOLOGY MRI left foot pending   PROCEDURES:  Procedures   MEDICATIONS ORDERED IN ED: Medications  ceFEPIme (MAXIPIME) 2 g in sodium chloride 0.9 % 100 mL IVPB (has no administration in time range)  metroNIDAZOLE (FLAGYL) IVPB 500 mg (has  no administration in time range)  vancomycin (VANCOCIN) IVPB 1000 mg/200 mL premix (has no administration in time range)     IMPRESSION / MDM / ASSESSMENT AND PLAN / ED COURSE  I reviewed the triage vital signs and the nursing notes.                              Differential diagnosis includes, but is not limited to, cellulitis of left foot, osteomyelitis, foot abscess, AKI, electrolyte abnormality, uncontrolled diabetes  Patient's presentation is most consistent with acute presentation with potential threat to life or bodily function.  Patient presents with diabetic foot infection with outpatient treatment failure, possible osteomyelitis.  MRI ordered.  Case discussed with hospitalist for further management.       FINAL CLINICAL IMPRESSION(S) / ED DIAGNOSES   Final diagnoses:  None     Rx / DC Orders   ED Discharge Orders     None        Note:  This document was prepared using Dragon voice recognition software and may include unintentional dictation errors.   Carrie Mew, MD 08/17/22  1112

## 2022-08-17 NOTE — Consult Note (Signed)
Reason for Consult: Osteomyelitis Referring Physician: Dr Clyde Lundborg, MD  [name] is an 66 y.o. male.  HPI: He has a history of type 2 diabetes is well controlled.  He previously developed a diabetic foot infection necessitating a partial first ray amputation earlier this year with Dr. Alberteen Spindle.  He has been having ongoing treatment for his plantar foot wound with the wound care center and this has waxed and waned.  Developed swelling and redness and presented to the hospital for admission due to this.  Past Medical History:  Diagnosis Date   Diabetes mellitus without complication (HCC)    Gout     Past Surgical History:  Procedure Laterality Date   AMPUTATION TOE Left 12/16/2021   Procedure: AMPUTATION TOE - Left Great;  Surgeon: Linus Galas, DPM;  Location: [facility];  Service: Podiatry;  Laterality: Left;   AMPUTATION TOE Left 01/14/2022   Procedure: GREAT TOE AMPUTATION;  Surgeon: Linus Galas, DPM;  Location: [facility];  Service: Podiatry;  Laterality: Left;   CATARACT EXTRACTION W/PHACO Left 05/24/2022   Procedure: CATARACT EXTRACTION PHACO AND INTRAOCULAR LENS PLACEMENT (IOC) LEFT 7.63 00:42.1;  Surgeon: Galen Manila, MD;  Location: [facility];  Service: Ophthalmology;  Laterality: Left;  Diabetic   CATARACT EXTRACTION W/PHACO Right 06/07/2022   Procedure: CATARACT EXTRACTION PHACO AND INTRAOCULAR LENS PLACEMENT (IOC) RIGHT;  Surgeon: Galen Manila, MD;  Location: [facility];  Service: Ophthalmology;  Laterality: Right;  7.33 0:45.3   LAPAROSCOPIC GASTRIC BANDING      History reviewed. No pertinent family history.  Social History:  reports that he quit smoking about 24 years ago. His smoking use included cigarettes. He has never used smokeless tobacco. He reports that he does not drink alcohol and does not use drugs.  Allergies: No Known Allergies  Medications: I have reviewed the patient's current medications.  Results for orders placed or performed during  the hospital encounter of 08/17/22 (from the past 48 hour(s))  Lactic acid, plasma     Status: None   Collection Time: 08/17/22  9:36 AM  Result Value Ref Range   Lactic Acid, Venous 1.6 0.5 - 1.9 mmol/L    Comment: Performed at [facility], [address] [place], [place] [address]  Comprehensive metabolic panel     Status: Abnormal   Collection Time: 08/17/22  9:36 AM  Result Value Ref Range   Sodium 139 135 - 145 mmol/L   Potassium 4.5 3.5 - 5.1 mmol/L   Chloride 103 98 - 111 mmol/L   CO2 27 22 - 32 mmol/L   Glucose, Bld 149 (H) 70 - 99 mg/dL    Comment: Glucose reference range applies only to samples taken after fasting for at least 8 hours.   BUN 24 (H) 8 - 23 mg/dL   Creatinine, Ser 4.00 (H) 0.61 - 1.24 mg/dL   Calcium 9.9 8.9 - 86.7 mg/dL   Total Protein 8.5 (H) 6.5 - 8.1 g/dL   Albumin 3.9 3.5 - 5.0 g/dL   AST 14 (L) 15 - 41 U/L   ALT 13 0 - 44 U/L   Alkaline Phosphatase 77 38 - 126 U/L   Total Bilirubin 0.7 0.3 - 1.2 mg/dL   GFR, Estimated 54 (L) >60 mL/min    Comment: (NOTE) Calculated using the CKD-EPI Creatinine Equation (2021)    Anion gap 9 5 - 15    Comment: Performed at [facility], [address], [place], [place] [address]  CBC with Differential  Status: Abnormal   Collection Time: 08/17/22  9:36 AM  Result Value Ref Range   WBC 7.8 4.0 - 10.5 K/uL   RBC 4.97 4.22 - 5.81 MIL/uL   Hemoglobin 13.3 13.0 - 17.0 g/dL   HCT 42.8 39.0 - 52.0 %   MCV 86.1 80.0 - 100.0 fL   MCH 26.8 26.0 - 34.0 pg   MCHC 31.1 30.0 - 36.0 g/dL   RDW 18.4 (H) 11.5 - 15.5 %   Platelets 313 150 - 400 K/uL   nRBC 0.0 0.0 - 0.2 %   Neutrophils Relative % 67 %   Neutro Abs 5.2 1.7 - 7.7 K/uL   Lymphocytes Relative 21 %   Lymphs Abs 1.7 0.7 - 4.0 K/uL   Monocytes Relative 7 %   Monocytes Absolute 0.5 0.1 - 1.0 K/uL   Eosinophils Relative 4 %   Eosinophils Absolute 0.3 0.0 - 0.5 K/uL   Basophils Relative 1 %   Basophils Absolute 0.1 0.0 - 0.1 K/uL    Immature Granulocytes 0 %   Abs Immature Granulocytes 0.02 0.00 - 0.07 K/uL    Comment: Performed at [facility], [address] [place], [place] [address]  Brain natriuretic peptide     Status: None   Collection Time: 08/17/22  9:36 AM  Result Value Ref Range   B Natriuretic Peptide 23.1 0.0 - 100.0 pg/mL    Comment: Performed at [facility], [address] [place], [place] [address]  Sedimentation rate     Status: Abnormal   Collection Time: 08/17/22  9:36 AM  Result Value Ref Range   Sed Rate 65 (H) 0 - 20 mm/hr    Comment: Performed at [facility], [address] [place], [place] [address]  CBG monitoring, ED     Status: Abnormal   Collection Time: 08/17/22 12:41 PM  Result Value Ref Range   Glucose-Capillary 117 (H) 70 - 99 mg/dL    Comment: Glucose reference range applies only to samples taken after fasting for at least 8 hours.  Protime-INR     Status: None   Collection Time: 08/17/22  2:19 PM  Result Value Ref Range   Prothrombin Time 13.1 11.4 - 15.2 seconds   INR 1.0 0.8 - 1.2    Comment: (NOTE) INR goal varies based on device and disease states. Performed at [facility], [address] [place], [place] [address]   APTT     Status: Abnormal   Collection Time: 08/17/22  2:19 PM  Result Value Ref Range   aPTT 40 (H) 24 - 36 seconds    Comment:        IF BASELINE aPTT IS ELEVATED, SUGGEST PATIENT RISK ASSESSMENT BE USED TO DETERMINE APPROPRIATE ANTICOAGULANT THERAPY. Performed at [facility], [address] [place],  [address]   Glucose, capillary     Status: Abnormal   Collection Time: 08/17/22  5:58 PM  Result Value Ref Range   Glucose-Capillary 148 (H) 70 - 99 mg/dL    Comment: Glucose reference range applies only to samples taken after fasting for at least 8 hours.    MR FOOT LEFT W WO CONTRAST  Result Date: 08/17/2022 CLINICAL DATA:  Diabetic foot pain EXAM: MRI OF THE LEFT FOREFOOT WITHOUT  AND WITH CONTRAST TECHNIQUE: Multiplanar, multisequence MR imaging of the left forefoot was performed both before and after administration of intravenous contrast. CONTRAST:  98mL GADAVIST GADOBUTROL 1 MMOL/ML IV SOLN COMPARISON:  X-ray 08/03/2022 FINDINGS: Bones/Joint/Cartilage  Postsurgical changes from transmetatarsal amputation of the first ray at the level of the mid diaphysis. There is acute osteomyelitis involving the distal 1.5 cm of the residual first metatarsal shaft. Acute osteomyelitis of the second metatarsal extending from the level of the second metatarsal head to the proximal metaphysis. Acute osteomyelitis of the second toe proximal phalanx. Dorsal dislocation of the second MTP joint with complex second MTP joint effusion compatible with septic arthritis. Acute osteomyelitis of the third metatarsal extending from the metatarsal head to the mid diaphysis. Pathologic fracture of the third metatarsal neck. Acute osteomyelitis of the third toe proximal phalanx. Mild bone marrow edema within the fourth toe proximal phalanx may be reactive or secondary to early osteomyelitis. Bones of the midfoot demonstrate degenerative findings with mild reactive subchondral marrow edema. No findings to suggest osteomyelitis or septic arthritis within the midfoot. Ligaments Intact Lisfranc ligament. Disruption of the second MTP joint capsule. Muscles and Tendons Denervation changes of the foot musculature with findings of myositis. Tenosynovitis of the second toe flexor tendon. Soft tissues Complex abscess centered at the second MTP joint contiguous with a plantar ulceration. Collection measures approximately 4.1 x 2.2 x 4.0 cm. Extensive changes of cellulitis throughout the distal forefoot. IMPRESSION: 1. Acute osteomyelitis of the first, second, and third metatarsals as well as the proximal phalanxes of the second and third toes. Reactive osteitis or early osteomyelitis of the fourth toe proximal phalanx. 2. Complex  abscess centered at the second MTP joint contiguous with a plantar ulceration. Collection measures approximately 4.1 x 2.2 x 4.0 cm. 3. Dorsal dislocation of the second MTP joint with complex second MTP joint effusion compatible with septic arthritis. 4. Tenosynovitis of the second toe flexor tendon. Electronically Signed   By: Duanne Guess D.O.   On: 08/17/2022 13:49    Review of Systems  Musculoskeletal:        Swelling of left foot  Skin:        Ulcer left foot  All other systems reviewed and are negative.  Blood pressure 104/66, pulse 76, temperature 97.6 F (36.4 C), resp. rate 18, height 6\' 2"  (1.88 m), weight 102.1 kg, SpO2 99 %.  Vitals:   08/17/22 1524 08/17/22 1726  BP:  104/66  Pulse:  76  Resp:  18  Temp: (!) 97.4 F (36.3 C) 97.6 F (36.4 C)  SpO2:  99%    General AA&O x3. Normal mood and affect.  Vascular Dorsalis pedis and posterior tibial pulses  present 2+ left  Capillary refill normal to all digits. Pedal hair growth diminished.  Neurologic Epicritic sensation grossly absent.  Dermatologic (Wound) Full-thickness ulceration submetatarsal 2 with exposed subcutaneous tissue, previous well-healed partial first ray amputation site  Orthopedic: Motor intact BLE.    Assessment/Plan:  Osteomyelitis of left foot -Imaging: Studies independently reviewed.  Unfortunately the extent and severity of the osteomyelitis necessitates a transmetatarsal amputation which I discussed with the patient.  We discussed the rationale for this in the healing process.  He indicated he understands -Antibiotics: Continue broad-spectrum antibiotics for diabetic foot infection -WB Status: He will be NWB after surgery -Surgical Plan: Transmetatarsal amputation planned for tomorrow with Dr. 08/19/22 -N.p.o. past midnight  Allena Katz 08/17/2022, 7:43 PM   Best available via secure chat for questions or concerns.

## 2022-08-17 NOTE — Consult Note (Signed)
Pharmacy Antibiotic Note  [name] is a 66 y.o. male admitted on 08/17/2022 with cellulitis.  Pharmacy has been consulted for vancomycin dosing.  Assessment: 66 yo M with PMH diabetes, CKD (BL Scr 1.2), CVA presents with 2-week h/o left foot pain redness and swelling. Pt also has a chronic right foot wound being managed by wound care. Pt reports being treated with amoxicillin and ciprofloxacin but wound on foot not improved. Pt is afebrile and VSS and WBCs within normal limits. No cultures collected yet. Scr is mildly elevated from baseline. Pt has also been started on metronidazole and ceftriaxone.  Vancomycin 2000 mg IV q24H Goal AUC 400-550  Est AUC: 512.2; Cmax: 37.8; Cmin: 11.7 SCr 1.42; IBW; Vd 0.72  Plan: Give vancomycin 2000 mg IV x 1 as a loading dose Initiate vancomycin 2000 mg IV q24H Monitor renal function to assess for necessary changes in vancomycin dosing Follow up culture results (if collected) to assess for opportunities to narrow therapy  Height: 6\' 2"  (188 cm) Weight: 102.1 kg (225 lb) IBW/kg (Calculated) : 82.2  Temp (24hrs), Avg:98.4 F (36.9 C), Min:98.4 F (36.9 C), Max:98.4 F (36.9 C)  Recent Labs  Lab 08/17/22 0936  WBC 7.8  CREATININE 1.42*  LATICACIDVEN 1.6    Estimated Creatinine Clearance: 64.4 mL/min (A) (by C-G formula based on SCr of 1.42 mg/dL (H)).    No Known Allergies  Antimicrobials this admission: Ceftriaxone 10/18 >> Vancomycin 10/18 >>  Metronidazole 10/18 >>   Dose adjustments this admission: N/A  Microbiology results: N/A  Thank you for allowing pharmacy to be a part of this patient's care.  Dara Hoyer, PharmD PGY-1 Pharmacy Resident 08/17/2022 11:41 AM

## 2022-08-17 NOTE — ED Notes (Signed)
Report given to Brandon RN.

## 2022-08-17 NOTE — ED Triage Notes (Signed)
Pt via POV from home. Pt c/o L foot infection for the past 2 weeks. Pt states he has been on abx and it hasn't been getting any better. Podiatrist recommended pt come here for IV abx and MRI with contrast of his L foot. Denies any fever. Pt has a hx of diabetes. Pt is A&OX4 and NAD

## 2022-08-17 NOTE — ED Notes (Signed)
Pt has large open wound noted to bottom of foot with drainage present. Redness noted around site and on top of foot.

## 2022-08-17 NOTE — Consult Note (Signed)
PHARMACY -  BRIEF ANTIBIOTIC NOTE   Pharmacy has received consult(s) for cefepime and vancomycin from an ED provider.  The patient's profile has been reviewed for ht/wt/allergies/indication/available labs.    One time order(s) placed for cefepime 2 g IV and vancomycin 1000 mg IV  Further antibiotics/pharmacy consults should be ordered by admitting physician if indicated.                       Thank you, Dara Hoyer, PharmD PGY-1 Pharmacy Resident 08/17/2022 10:34 AM

## 2022-08-17 NOTE — Assessment & Plan Note (Addendum)
Recent A1c 6.5, well controlled.  Patient taking Jardiance, semaglutide, metformin. -SSI

## 2022-08-17 NOTE — H&P (Signed)
History and Physical    [name] GNF:[id number] DOB: 02-20-1956 DOA: 08/17/2022  Referring MD/NP/PA:   PCP: Peggye Form, NP   Patient coming from:  The patient is coming from home.  At baseline, pt is independent for most of ADL.        Chief Complaint: left foot ulcer with infection  HPI: [name] is a 66 y.o. 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  Patient states that he has left foot plantar ulcer for more than 2 weeks.  He was treated with Cipro and azithromycin and has been following up with wound care clinic.  His foot ulcer has not improved significantly.  His left foot is mildly swelling, erythematous and painful.  The left foot pain is constant, sharp, 6 out of 10 in severity, nonradiating.  Patient denies fever or chills.  Patient does not have chest pain, cough, shortness breath.  No nausea, vomiting, diarrhea or abdominal pain.  Denies symptoms of UTI.  Data reviewed independently and ED Course: pt was found to have WBC 7.8, lactic acid 1.6, renal function close to baseline, temperature normal, blood pressure 112/78, heart rate 97, RR 16, oxygen saturation 98% on room air.  Patient is admitted to [facility] bed as inpatient.  Dr. Sherryle Lis of podiatry is consulted.   EKG: Not done in ED, will get one.    Review of Systems:   General: no fevers, chills, no body weight gain, fatigue HEENT: no blurry vision, hearing changes or sore throat Respiratory: no dyspnea, coughing, wheezing CV: no chest pain, no palpitations GI: no nausea, vomiting, abdominal pain, diarrhea, constipation GU: no dysuria, burning on urination, increased urinary frequency, hematuria  Ext: no leg edema Neuro: no unilateral weakness, numbness, or tingling, no vision change or hearing loss Skin: has  plantar ulcer in left foot. MSK: No muscle spasm, no deformity, no limitation of range of movement in spin Heme: No easy bruising.  Travel history: No recent long distant travel.   Allergy: No Known Allergies  Past Medical History:  Diagnosis Date   Diabetes mellitus without complication ([place])    Gout     Past Surgical History:  Procedure Laterality Date   AMPUTATION TOE Left 12/16/2021   Procedure: AMPUTATION TOE - Left Great;  Surgeon: Sharlotte Alamo, DPM;  Location: [facility];  Service: Podiatry;  Laterality: Left;   AMPUTATION TOE Left 01/14/2022   Procedure: GREAT TOE AMPUTATION;  Surgeon: Sharlotte Alamo, DPM;  Location: [facility];  Service: Podiatry;  Laterality: Left;   CATARACT EXTRACTION W/PHACO Left 05/24/2022   Procedure: CATARACT EXTRACTION PHACO AND INTRAOCULAR LENS PLACEMENT (IOC) LEFT 7.63 00:42.1;  Surgeon: Birder Robson, MD;  Location: [place];  Service: Ophthalmology;  Laterality: Left;  Diabetic   CATARACT EXTRACTION W/PHACO Right 06/07/2022   Procedure: CATARACT EXTRACTION PHACO AND INTRAOCULAR LENS PLACEMENT ([place]) RIGHT;  Surgeon: Birder Robson, MD;  Location: [place];  Service: Ophthalmology;  Laterality: Right;  7.33 0:45.3   LAPAROSCOPIC GASTRIC BANDING      Social History:  reports that he quit smoking about 24 years ago. His smoking use included cigarettes. He has never used smokeless tobacco. He reports that he does not drink alcohol and does not use drugs.  Family History: History reviewed. No pertinent family history.   Prior to Admission medications   Medication Sig Start Date End  Date Taking? Authorizing Provider  allopurinol (ZYLOPRIM) 300 MG tablet Take 300 mg by mouth 2 (two) times daily.    [provider]  atorvastatin (LIPITOR) 80 MG tablet Take 1 tablet (80 mg total) by mouth daily. 12/07/21 06/07/22  Enzo Bi, MD  buPROPion (WELLBUTRIN XL) 150 MG 24 hr tablet Take 150 mg by mouth daily.    [provider]   gabapentin (NEURONTIN) 300 MG capsule Take 3 capsules (900 mg total) by mouth 3 (three) times daily. 12/17/21   Eugenie Filler, MD  HYDROcodone-acetaminophen (NORCO/VICODIN) 5-325 MG tablet Take 1-2 tablets by mouth every 6 (six) hours as needed for severe pain. Patient not taking: Reported on 05/12/2022 01/16/22   Bonnielee Haff, MD  lisinopril (ZESTRIL) 10 MG tablet Take 0.5 tablets (5 mg total) by mouth daily. Patient not taking: Reported on 05/12/2022 12/20/21   Eugenie Filler, MD  metFORMIN (GLUCOPHAGE) 500 MG tablet Take by mouth 2 (two) times daily with a meal.    [provider]  polyethylene glycol (MIRALAX / GLYCOLAX) 17 g packet Take 17 g by mouth daily as needed for moderate constipation. 01/16/22   Bonnielee Haff, MD  Semaglutide 7 MG TABS Take 14 mg by mouth daily.    [provider]  testosterone cypionate (DEPOTESTOSTERONE CYPIONATE) 200 MG/ML injection Inject into the muscle every 14 (fourteen) days.    [provider]    Physical Exam: Vitals:   08/17/22 0935 08/17/22 0946  BP: 112/78   Pulse: 97   Resp: 16   Temp: 98.4 F (36.9 C)   TempSrc: Oral   SpO2: 98%   Weight:  102.1 kg  Height:  6' 2" (1.88 m)   General: Not in acute distress HEENT:       Eyes: PERRL, EOMI, no scleral icterus.       ENT: No discharge from the ears and nose, no pharynx injection, no tonsillar enlargement.        Neck: No JVD, no bruit, no mass felt. Heme: No neck lymph node enlargement. Cardiac: S1/S2, RRR, No murmurs, No gallops or rubs. Respiratory: No rales, wheezing, rhonchi or rubs. GI: Soft, nondistended, nontender, no rebound pain, no organomegaly, BS present. GU: No hematuria Ext: No pitting leg edema bilaterally. 1+DP/PT pulse bilaterally. Musculoskeletal: No joint deformities, No joint redness or warmth, no limitation of ROM in spin. Skin: Has plantar ulcer in left foot with little serosanguineous drainage, left foot is erythematous, swelling  and tender.  S/p of left great toe amputation.        Neuro: Alert, oriented X3, cranial nerves II-XII grossly intact, moves all extremities normally. Psych: Patient is not psychotic, no suicidal or hemocidal ideation.  Labs on Admission: I have personally reviewed following labs and imaging studies  CBC: Recent Labs  Lab 08/17/22 0936  WBC 7.8  NEUTROABS 5.2  HGB 13.3  HCT 42.8  MCV 86.1  PLT 785   Basic Metabolic Panel: Recent Labs  Lab 08/17/22 0936  NA 139  K 4.5  CL 103  CO2 27  GLUCOSE 149*  BUN 24*  CREATININE 1.42*  CALCIUM 9.9   GFR: Estimated Creatinine Clearance: 64.4 mL/min (A) (by C-G formula based on SCr of 1.42 mg/dL (H)). Liver Function Tests: Recent Labs  Lab 08/17/22 0936  AST 14*  ALT 13  ALKPHOS 77  BILITOT 0.7  PROT 8.5*  ALBUMIN 3.9   No results for input(s): "LIPASE", "AMYLASE" in the last 168 hours. No results for input(s): "AMMONIA"  in the last 168 hours. Coagulation Profile: No results for input(s): "INR", "PROTIME" in the last 168 hours. Cardiac Enzymes: No results for input(s): "CKTOTAL", "CKMB", "CKMBINDEX", "TROPONINI" in the last 168 hours. BNP (last 3 results) No results for input(s): "PROBNP" in the last 8760 hours. HbA1C: No results for input(s): "HGBA1C" in the last 72 hours. CBG: Recent Labs  Lab 08/17/22 1241  GLUCAP 117*   Lipid Profile: No results for input(s): "CHOL", "HDL", "LDLCALC", "TRIG", "CHOLHDL", "LDLDIRECT" in the last 72 hours. Thyroid Function Tests: No results for input(s): "TSH", "T4TOTAL", "FREET4", "T3FREE", "THYROIDAB" in the last 72 hours. Anemia Panel: No results for input(s): "VITAMINB12", "FOLATE", "FERRITIN", "TIBC", "IRON", "RETICCTPCT" in the last 72 hours. Urine analysis:    Component Value Date/Time   COLORURINE YELLOW (A) 12/16/2021 1110   APPEARANCEUR CLEAR (A) 12/16/2021 1110   LABSPEC 1.021 12/16/2021 1110   PHURINE 5.0 12/16/2021 1110   GLUCOSEU >=500 (A) 12/16/2021  1110   HGBUR NEGATIVE 12/16/2021 [place] 12/16/2021 1110   KETONESUR NEGATIVE 12/16/2021 1110   PROTEINUR NEGATIVE 12/16/2021 1110   NITRITE NEGATIVE 12/16/2021 1110   LEUKOCYTESUR NEGATIVE 12/16/2021 1110   Sepsis Labs: _0 (procalcitonin:4,lacticidven:4) )No results found for this or any previous visit (from the past 240 hour(s)).   Radiological Exams on Admission: No results found.    Assessment/Plan Principal Problem:   Diabetic infection of left foot (HCC) Active Problems:   Type II diabetes mellitus with renal manifestations (HCC)   Stage 3a chronic kidney disease (HCC)   Depression   Gout   Chronic diastolic CHF (congestive heart failure) (HCC)   CVA (cerebral vascular accident) ([place])   HLD (hyperlipidemia)   Assessment and Plan: * Diabetic infection of left foot [facility]) Patient failed outpatient antibiotic and wound care treatment.  No fever or leukocytosis.  Does not meet criteria for sepsis. Lactic acid 1.6. has tachycardia with heart rate 97.  Patient likely has osteomyelitis.  Consulted Dr. Sherryle Lis of podiatry.   - Admitted to MedSurg bed as inpatient - Empiric antimicrobial treatment with vancomycin, Flagyl, Rocephin - PRN Zofran for nausea, and tylenol andPercocet for pain - Blood cultures x 2  - ESR and CRP - MRI-left foot - IVF: 1.0 L of NS bolus     Type II diabetes mellitus with renal manifestations (HCC) Recent A1c 6.5, well controlled.  Patient taking Jardiance, semaglutide, metformin. -SSI  Stage 3a chronic kidney disease (HCC) Baseline creatinine 1.1-1.5 recently.  His creatinine is 1.02, BUN 24, GFR 54.  Stable. -Monitor renal function by BMP  Depression - Continue home medications: Requip, Seroquel  Gout - Continue home allopurinol  Chronic diastolic CHF (congestive heart failure) ([place]) 2D echo on 12/07/2021 showed EF of 50-55% with grade 2 diastolic dysfunction.  Patient does not have leg edema or shortness  breath.  CHF seem to be compensated.  BNP 23.1 -Watch volume status closely  CVA (cerebral vascular accident) ([place]) -lipitor  HLD (hyperlipidemia) -lipitor          DVT ppx: SQ Heparin     Code Status: Full code per pt  Family Communication: I offered to call his family, but patient does not want me to call his family.  He said he will call his family by himself.  Disposition Plan:  Anticipate discharge back to previous environment  Consults called:  Dr. Sherryle Lis of podiatry is consulted.  Admission status and Level of care: Med-Surg: as inpt     Dispo: The patient is from: Home  Anticipated d/c is to: Home              Anticipated d/c date is: 2 days              Patient currently is not medically stable to d/c.    Severity of Illness:  The appropriate patient status for this patient is INPATIENT. Inpatient status is judged to be reasonable and necessary in order to provide the required intensity of service to ensure the patient's safety. The patient's presenting symptoms, physical exam findings, and initial radiographic and laboratory data in the context of their chronic comorbidities is felt to place them at high risk for further clinical deterioration. Furthermore, it is not anticipated that the patient will be medically stable for discharge from the hospital within 2 midnights of admission.   * I certify that at the point of admission it is my clinical judgment that the patient will require inpatient hospital care spanning beyond 2 midnights from the point of admission due to high intensity of service, high risk for further deterioration and high frequency of surveillance required.*       Date of Service 08/17/2022    Ivor Costa [facility]   If 7PM-7AM, please contact night-coverage [email or web address] 08/17/2022, 1:24 PM

## 2022-08-17 NOTE — Assessment & Plan Note (Signed)
Patient failed outpatient antibiotic and wound care treatment.  No fever or leukocytosis.  Does not meet criteria for sepsis. Lactic acid 1.6. has tachycardia with heart rate 97.  Patient likely has osteomyelitis.  Consulted Dr. Sherryle Lis of podiatry.   - Admitted to MedSurg bed as inpatient - Empiric antimicrobial treatment with vancomycin, Flagyl, Rocephin - PRN Zofran for nausea, and tylenol andPercocet for pain - Blood cultures x 2  - ESR and CRP - MRI-left foot - IVF: 1.0 L of NS bolus

## 2022-08-17 NOTE — Assessment & Plan Note (Signed)
2D echo on 12/07/2021 showed EF of 50-55% with grade 2 diastolic dysfunction.  Patient does not have leg edema or shortness breath.  CHF seem to be compensated.  BNP 23.1 -Watch volume status closely

## 2022-08-18 ENCOUNTER — Ambulatory Visit: Payer: Medicare Other

## 2022-08-18 ENCOUNTER — Inpatient Hospital Stay: Payer: Medicare Other

## 2022-08-18 ENCOUNTER — Encounter
Admission: EM | Disposition: A | Discharge status: Home / Self Care | Payer: Self-pay | Source: Home / Self Care | Attending: Internal Medicine

## 2022-08-18 ENCOUNTER — Other Ambulatory Visit: Payer: Self-pay

## 2022-08-18 ENCOUNTER — Inpatient Hospital Stay: Payer: Medicare Other | Admitting: Anesthesiology

## 2022-08-18 DIAGNOSIS — E1169 Type 2 diabetes mellitus with other specified complication: Secondary | ICD-10-CM | POA: Diagnosis not present

## 2022-08-18 DIAGNOSIS — M86272 Subacute osteomyelitis, left ankle and foot: Secondary | ICD-10-CM

## 2022-08-18 DIAGNOSIS — M86472 Chronic osteomyelitis with draining sinus, left ankle and foot: Secondary | ICD-10-CM | POA: Diagnosis not present

## 2022-08-18 DIAGNOSIS — N1831 Chronic kidney disease, stage 3a: Secondary | ICD-10-CM | POA: Diagnosis not present

## 2022-08-18 HISTORY — PX: ACHILLES TENDON SURGERY: SHX542

## 2022-08-18 HISTORY — PX: TRANSMETATARSAL AMPUTATION: SHX6197

## 2022-08-18 LAB — GLUCOSE, CAPILLARY
Glucose-Capillary: 142 mg/dL — ABNORMAL HIGH (ref 70–99)
Glucose-Capillary: 140 mg/dL — ABNORMAL HIGH (ref 70–99)
Glucose-Capillary: 128 mg/dL — ABNORMAL HIGH (ref 70–99)
Glucose-Capillary: 160 mg/dL — ABNORMAL HIGH (ref 70–99)
Glucose-Capillary: 268 mg/dL — ABNORMAL HIGH (ref 70–99)
Glucose-Capillary: 240 mg/dL — ABNORMAL HIGH (ref 70–99)

## 2022-08-18 LAB — CBC
HCT: 36.8 % — ABNORMAL LOW (ref 39.0–52.0)
Hemoglobin: 11.9 g/dL — ABNORMAL LOW (ref 13.0–17.0)
MCH: 27.4 pg (ref 26.0–34.0)
MCHC: 32.3 g/dL (ref 30.0–36.0)
MCV: 84.8 fL (ref 80.0–100.0)
Platelets: 270 10*3/uL (ref 150–400)
RBC: 4.34 MIL/uL (ref 4.22–5.81)
RDW: 17.9 % — ABNORMAL HIGH (ref 11.5–15.5)
WBC: 6.2 10*3/uL (ref 4.0–10.5)
nRBC: 0 % (ref 0.0–0.2)

## 2022-08-18 LAB — BASIC METABOLIC PANEL
Anion gap: 7 (ref 5–15)
BUN: 24 mg/dL — ABNORMAL HIGH (ref 8–23)
CO2: 25 mmol/L (ref 22–32)
Calcium: 9 mg/dL (ref 8.9–10.3)
Chloride: 105 mmol/L (ref 98–111)
Creatinine, Ser: 1.23 mg/dL (ref 0.61–1.24)
GFR, Estimated: >60 mL/min (ref >60)
Glucose, Bld: 149 mg/dL — ABNORMAL HIGH (ref 70–99)
Potassium: 3.8 mmol/L (ref 3.5–5.1)
Sodium: 137 mmol/L (ref 135–145)

## 2022-08-18 LAB — HEMOGLOBIN AND HEMATOCRIT, BLOOD
HCT: 39.1 % (ref 39.0–52.0)
Hemoglobin: 12.4 g/dL — ABNORMAL LOW (ref 13.0–17.0)

## 2022-08-18 LAB — C-REACTIVE PROTEIN: CRP: 11.1 mg/dL — ABNORMAL HIGH (ref <1.0)

## 2022-08-18 SURGERY — AMPUTATION, FOOT, TRANSMETATARSAL
Anesthesia: General | Site: Foot | Laterality: Left

## 2022-08-18 MED ORDER — PHENYLEPHRINE 80 MCG/ML (10ML) SYRINGE FOR IV PUSH (FOR BLOOD PRESSURE SUPPORT)
PREFILLED_SYRINGE | INTRAVENOUS | Status: DC | PRN
  Administered 2022-08-18: 80 ug via INTRAVENOUS
  Administered 2022-08-18 (×4): 160 ug via INTRAVENOUS

## 2022-08-18 MED ORDER — FENTANYL CITRATE (PF) 100 MCG/2ML IJ SOLN
INTRAMUSCULAR | Status: AC
  Filled 2022-08-18: qty 2

## 2022-08-18 MED ORDER — FENTANYL CITRATE (PF) 100 MCG/2ML IJ SOLN: 25.0000 ug | INTRAMUSCULAR | Status: DC | PRN

## 2022-08-18 MED ORDER — FENTANYL CITRATE (PF) 100 MCG/2ML IJ SOLN
INTRAMUSCULAR | Status: DC | PRN
  Administered 2022-08-18 (×2): 25 ug via INTRAVENOUS
  Administered 2022-08-18: 50 ug via INTRAVENOUS

## 2022-08-18 MED ORDER — BUPIVACAINE HCL (PF) 0.5 % IJ SOLN
INTRAMUSCULAR | Status: AC
  Filled 2022-08-18: qty 30

## 2022-08-18 MED ORDER — VANCOMYCIN HCL IN DEXTROSE 1-5 GM/200ML-% IV SOLN
1000.0000 mg | Freq: Two times a day (BID) | INTRAVENOUS | Status: DC
  Administered 2022-08-18 – 2022-08-19 (×2): 1000 mg via INTRAVENOUS
  Filled 2022-08-18 (×3): qty 200

## 2022-08-18 MED ORDER — PROPOFOL 10 MG/ML IV BOLUS
INTRAVENOUS | Status: AC
  Filled 2022-08-18: qty 20

## 2022-08-18 MED ORDER — 0.9 % SODIUM CHLORIDE (POUR BTL) OPTIME
TOPICAL | Status: DC | PRN
  Administered 2022-08-18: 500 mL

## 2022-08-18 MED ORDER — DEXAMETHASONE SODIUM PHOSPHATE 10 MG/ML IJ SOLN
INTRAMUSCULAR | Status: DC | PRN
  Administered 2022-08-18: 5 mg via INTRAVENOUS

## 2022-08-18 MED ORDER — SODIUM CHLORIDE 0.9 % IR SOLN
Status: DC | PRN
  Administered 2022-08-18: 1000 mL

## 2022-08-18 MED ORDER — PROPOFOL 10 MG/ML IV BOLUS
INTRAVENOUS | Status: DC | PRN
  Administered 2022-08-18: 150 mg via INTRAVENOUS

## 2022-08-18 MED ORDER — OXYCODONE HCL 5 MG PO TABS: 5.0000 mg | ORAL_TABLET | Freq: Once | ORAL | Status: DC | PRN

## 2022-08-18 MED ORDER — CHLORHEXIDINE GLUCONATE 0.12 % MT SOLN
OROMUCOSAL | Status: AC
  Filled 2022-08-18: qty 15

## 2022-08-18 MED ORDER — MIDAZOLAM HCL 2 MG/2ML IJ SOLN
INTRAMUSCULAR | Status: DC | PRN
  Administered 2022-08-18 (×2): 1 mg via INTRAVENOUS

## 2022-08-18 MED ORDER — ONDANSETRON HCL 4 MG/2ML IJ SOLN: 4.0000 mg | Freq: Once | INTRAMUSCULAR | Status: DC | PRN

## 2022-08-18 MED ORDER — LIDOCAINE-EPINEPHRINE 1 %-1:100000 IJ SOLN
INTRAMUSCULAR | Status: AC
  Filled 2022-08-18: qty 1

## 2022-08-18 MED ORDER — EPHEDRINE SULFATE (PRESSORS) 50 MG/ML IJ SOLN
INTRAMUSCULAR | Status: DC | PRN
  Administered 2022-08-18: 5 mg via INTRAVENOUS

## 2022-08-18 MED ORDER — LIDOCAINE HCL (CARDIAC) PF 100 MG/5ML IV SOSY
PREFILLED_SYRINGE | INTRAVENOUS | Status: DC | PRN
  Administered 2022-08-18: 100 mg via INTRAVENOUS

## 2022-08-18 MED ORDER — OXYCODONE HCL 5 MG/5ML PO SOLN: 5.0000 mg | Freq: Once | ORAL | Status: DC | PRN

## 2022-08-18 MED ORDER — SODIUM CHLORIDE 0.9 % IV SOLN: INTRAVENOUS | Status: DC | PRN

## 2022-08-18 MED ORDER — CHLORHEXIDINE GLUCONATE 0.12 % MT SOLN
15.0000 mL | Freq: Once | OROMUCOSAL | Status: AC
  Administered 2022-08-18: 15 mL via OROMUCOSAL

## 2022-08-18 MED ORDER — ACETAMINOPHEN 10 MG/ML IV SOLN: 1000.0000 mg | Freq: Once | INTRAVENOUS | Status: DC | PRN

## 2022-08-18 MED ORDER — MIDAZOLAM HCL 2 MG/2ML IJ SOLN
INTRAMUSCULAR | Status: AC
  Filled 2022-08-18: qty 2

## 2022-08-18 MED ORDER — ACETAMINOPHEN 10 MG/ML IV SOLN
INTRAVENOUS | Status: DC | PRN
  Administered 2022-08-18: 1000 mg via INTRAVENOUS

## 2022-08-18 MED ORDER — ONDANSETRON HCL 4 MG/2ML IJ SOLN
INTRAMUSCULAR | Status: DC | PRN
  Administered 2022-08-18: 4 mg via INTRAVENOUS

## 2022-08-18 MED ORDER — LIDOCAINE HCL (PF) 1 % IJ SOLN
INTRAMUSCULAR | Status: AC
  Filled 2022-08-18: qty 30

## 2022-08-18 MED ORDER — DEXAMETHASONE SODIUM PHOSPHATE 10 MG/ML IJ SOLN
INTRAMUSCULAR | Status: AC
  Filled 2022-08-18: qty 1

## 2022-08-18 MED ORDER — ACETAMINOPHEN 10 MG/ML IV SOLN
INTRAVENOUS | Status: AC
  Filled 2022-08-18: qty 100

## 2022-08-18 SURGICAL SUPPLY — 61 items
BAG COUNTER SPONGE SURGICOUNT (BAG) IMPLANT
BASIN KIT SINGLE STR (MISCELLANEOUS) ×2 IMPLANT
BLADE MED AGGRESSIVE (BLADE) IMPLANT
BLADE OSC/SAGITTAL MD 5.5X18 (BLADE) IMPLANT
BLADE SURG 15 STRL LF DISP TIS (BLADE) ×2 IMPLANT
BLADE SURG 15 STRL SS (BLADE) ×4
BLADE SURG MINI STRL (BLADE) ×4 IMPLANT
BNDG COHESIVE 4X5 TAN STRL LF (GAUZE/BANDAGES/DRESSINGS) ×2 IMPLANT
BNDG ELASTIC 4X5.8 VLCR NS LF (GAUZE/BANDAGES/DRESSINGS) ×2 IMPLANT
BNDG ELASTIC 4X5.8 VLCR STR LF (GAUZE/BANDAGES/DRESSINGS) ×2 IMPLANT
BNDG ESMARK 4X12 TAN STRL LF (GAUZE/BANDAGES/DRESSINGS) ×2 IMPLANT
BNDG GAUZE DERMACEA FLUFF 4 (GAUZE/BANDAGES/DRESSINGS) ×2 IMPLANT
BNDG STRETCH GAUZE 3IN X12FT (GAUZE/BANDAGES/DRESSINGS) ×2 IMPLANT
BOOT STEPPER DURA MED (SOFTGOODS) IMPLANT
COVER LIGHT HANDLE STERIS (MISCELLANEOUS) ×4 IMPLANT
CUFF TOURN SGL QUICK 12 (TOURNIQUET CUFF) IMPLANT
CUFF TOURN SGL QUICK 18X4 (TOURNIQUET CUFF) ×2 IMPLANT
DRAPE FLUOR MINI C-ARM 54X84 (DRAPES) ×2 IMPLANT
DURAPREP 26ML APPLICATOR (WOUND CARE) ×2 IMPLANT
ELECT REM PT RETURN 9FT ADLT (ELECTROSURGICAL) ×2
ELECTRODE REM PT RTRN 9FT ADLT (ELECTROSURGICAL) ×2 IMPLANT
GAUZE PAD ABD 8X10 STRL (GAUZE/BANDAGES/DRESSINGS) ×2 IMPLANT
GAUZE SPONGE 4X4 12PLY STRL (GAUZE/BANDAGES/DRESSINGS) ×2 IMPLANT
GAUZE XEROFORM 1X8 LF (GAUZE/BANDAGES/DRESSINGS) ×2 IMPLANT
GLOVE BIO SURGEON STRL SZ7 (GLOVE) ×2 IMPLANT
GLOVE BIO SURGEON STRL SZ7.5 (GLOVE) ×2 IMPLANT
GLOVE SURG UNDER LTX SZ8 (GLOVE) ×2 IMPLANT
GLOVE SURG UNDER POLY LF SZ7.5 (GLOVE) ×2 IMPLANT
GOWN STRL REUS W/ TWL LRG LVL3 (GOWN DISPOSABLE) ×4 IMPLANT
GOWN STRL REUS W/ TWL XL LVL3 (GOWN DISPOSABLE) ×4 IMPLANT
GOWN STRL REUS W/TWL LRG LVL3 (GOWN DISPOSABLE) ×4
GOWN STRL REUS W/TWL XL LVL3 (GOWN DISPOSABLE) ×4
HANDPIECE INTERPULSE COAX TIP (DISPOSABLE) ×2
HANDPIECE VERSAJET DEBRIDEMENT (MISCELLANEOUS) IMPLANT
IV NS 1000ML (IV SOLUTION) ×2
IV NS 1000ML BAXH (IV SOLUTION) ×2 IMPLANT
KIT TURNOVER KIT A (KITS) ×2 IMPLANT
LABEL OR SOLS (LABEL) ×2 IMPLANT
MANIFOLD NEPTUNE II (INSTRUMENTS) ×2 IMPLANT
NDL HYPO 25X1 1.5 SAFETY (NEEDLE) ×4 IMPLANT
NDL SAFETY ECLIP 18X1.5 (MISCELLANEOUS) ×2 IMPLANT
NEEDLE HYPO 22GX1.5 SAFETY (NEEDLE) IMPLANT
NEEDLE HYPO 25X1 1.5 SAFETY (NEEDLE) IMPLANT
NS IRRIG 1000ML POUR BTL (IV SOLUTION) ×2 IMPLANT
NS IRRIG 500ML POUR BTL (IV SOLUTION) ×2 IMPLANT
PACK DRAINAGE HVY KERLIX W/SPG (MISCELLANEOUS) ×2 IMPLANT
PACK EXTREMITY ARMC (MISCELLANEOUS) ×2 IMPLANT
PAD ARMBOARD 7.5X6 YLW CONV (MISCELLANEOUS) ×4 IMPLANT
SET HNDPC FAN SPRY TIP SCT (DISPOSABLE) ×2 IMPLANT
STAPLER SKIN PROX 35W (STAPLE) ×2 IMPLANT
STOCKINETTE STRL 6IN 960660 (GAUZE/BANDAGES/DRESSINGS) ×2 IMPLANT
STRIP CLOSURE SKIN 1/4X4 (GAUZE/BANDAGES/DRESSINGS) ×2 IMPLANT
SUT PROLENE 2 0 FS (SUTURE) ×2 IMPLANT
SUT PROLENE 3 0 PS 2 (SUTURE) ×2 IMPLANT
SUT VIC AB 2-0 SH 27 (SUTURE) ×2
SUT VIC AB 2-0 SH 27XBRD (SUTURE) ×2 IMPLANT
SWAB CULTURE AMIES ANAERIB BLU (MISCELLANEOUS) IMPLANT
SYR 10ML LL (SYRINGE) ×2 IMPLANT
SYR CONTROL 10ML LL (SYRINGE) IMPLANT
TRAP FLUID SMOKE EVACUATOR (MISCELLANEOUS) ×2 IMPLANT
WATER STERILE IRR 500ML POUR (IV SOLUTION) ×2 IMPLANT

## 2022-08-18 NOTE — Interval H&P Note (Signed)
History and Physical Interval Note:  08/18/2022 11:23 AM  [name]  has presented today for surgery, with the diagnosis of Diabetic Foot Infection.  The various methods of treatment have been discussed with the patient and family. After consideration of risks, benefits and other options for treatment, the patient has consented to  Procedure(s): TRANSMETATARSAL AMPUTATION (Left) ACHILLES LENGTHENING/KIDNER (Left) as a surgical intervention.  The patient's history has been reviewed, patient examined, no change in status, stable for surgery.  I have reviewed the patient's chart and labs.  Questions were answered to the patient's satisfaction.     Felipa Furnace

## 2022-08-18 NOTE — Anesthesia Postprocedure Evaluation (Signed)
Anesthesia Post Note  Patient: [name]  Procedure(s) Performed: TRANSMETATARSAL AMPUTATION (Left: Foot) ACHILLES LENGTHENING/KIDNER (Left)  Patient location during evaluation: PACU Anesthesia Type: General Level of consciousness: awake and alert Pain management: pain level controlled Vital Signs Assessment: post-procedure vital signs reviewed and stable Respiratory status: spontaneous breathing, nonlabored ventilation, respiratory function stable and patient connected to nasal cannula oxygen Cardiovascular status: blood pressure returned to baseline and stable Postop Assessment: no apparent nausea or vomiting Anesthetic complications: no   No notable events documented.   Last Vitals:  Vitals:   08/18/22 1245 08/18/22 1300  BP: (!) 104/59 113/65  Pulse: 69 73  Resp: 15 14  Temp:  (!) 36.1 C  SpO2: 94% 93%    Last Pain:  Vitals:   08/18/22 1300  TempSrc:   PainSc: 0-No pain                 Arita Miss

## 2022-08-18 NOTE — Consult Note (Signed)
Pharmacy Antibiotic Note  [name] is a 66 y.o. male admitted on 08/17/2022 with chronic foot wound. PMH significant for DM, CKD (baseline Scr 1.1-1.3), CVA. Patient presented with L foot pain redness and swelling x2 weeks and chronic R foot wound. 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 Recently prescribed Augmentin and doxycycline x14 days on 07/27/2022 and levofloxacin x7 days on 08/10/2022 with minimal improvement. 10/18 MRI of L foot concerning for OM of toes with evidence of fluid collection/abscess around second MTP joint. Podiatry planning L TMA on 10/19. Pharmacy has been consulted for vancomycin dosing.  Plan: Day 2 of antibiotics Adjust Vancomycin dose from 2000 mg IV Q24H to 1000 mg IV Q12H. Goal AUC 400-550. Expected AUC: 448.7 Expected Css min: 13.2 SCr used: 1.23  Weight used: IBW, Vd used: 0.72 (BMI 28.8) Patient is also on Ceftiaxone 2 g IV Q24H and Metronidazole 500 mg Q12H Continue to monitor renal function and follow culture results   Height: 6\' 2"  (188 cm) Weight: 102.1 kg (225 lb) IBW/kg (Calculated) : 82.2  Temp (24hrs), Avg:98 F (36.7 C), Min:97.4 F (36.3 C), Max:98.7 F (37.1 C)  Recent Labs  Lab 08/17/22 0936 08/18/22 0434  WBC 7.8 6.2  CREATININE 1.42* 1.23  LATICACIDVEN 1.6  --      Estimated Creatinine Clearance: 74.4 mL/min (by C-G formula based on SCr of 1.23 mg/dL).    No Known Allergies  Antimicrobials this admission: 10/18 Ceftriaxone >> 10/18 Vancomycin >>  10/18 Metronidazole >>   Dose adjustments this admission: 10/19 Vancomycin 2000 mg IV Q24H >> Vancomycin 1000 mg IV Q12H   Microbiology results: 10/18 BCx: NG<24H  Thank you for allowing pharmacy to be a part of this patient's care.  Gretel Acre, PharmD PGY1 Pharmacy Resident 08/18/2022 8:08 AM

## 2022-08-18 NOTE — Progress Notes (Signed)
[name] ([id number]) [id number] Page 1 of 12 Visit Report for 08/17/2022 Chief Complaint Document Details Patient Name: Date of Service: [name], [name] [name] 08/17/2022 8:15 A M Medical Record Number: [id number] Patient Account Number: [id number] Date of Birth/Sex: Treating RN: [birth year] (66 y.o. [name] Primary Care Provider: Manson [name] Other Clinician: Betha [name] Referring Provider: Treating Provider/Extender: [name] Weeks in Treatment: 40 Information Obtained from: Patient Chief Complaint Right plantar foot wound Electronic Signature(s) Signed: 08/17/2022 9:48:45 AM By: [name] Corwin DO Entered By: [name] on 08/17/2022 09:01:26 -------------------------------------------------------------------------------- Cellular or Tissue Based Product Details Patient Name: Date of Service: [name] [name] 08/17/2022 8:15 A M Medical Record Number: [id number] Patient Account Number: [id number] Date of Birth/Sex: Treating RN: April 15, 1956 (66 y.o. [name] Primary Care Provider: Manson [name] Other Clinician: Betha [name] Referring Provider: Treating Provider/Extender: [name] Weeks in Treatment: 40 Cellular or Tissue Based Product Type Wound #1 Right,Plantar Foot Applied to: Performed By: Physician [name] Corwin, MD Cellular or Tissue Based Product Type: Puraply AM Level of Consciousness (Pre-procedure): Awake and Alert Pre-procedure Verification/Time Out Yes - 08:47 Taken: Location: genitalia / hands / feet / multiple digits Wound Size (sq cm): 0.63 Product Size (sq cm): 4 Waste Size (sq cm): 2 Waste Reason: wound size Amount of Product Applied (sq cm): 2 Instrument Used: Forceps, Scissors Lot #: [id number] Order #: 4 Expiration Date: 11/16/2024 FenestratedJENO, [name] ([id number]) [id number] Page 2 of 12 Reconstituted:  No Secured: Yes Secured With: Steri-Strips Dressing Applied: Yes Primary Dressing: bolster Response to Treatment: Procedure was tolerated well Level of Consciousness (Post- Awake and Alert procedure): Post Procedure Diagnosis Same as Pre-procedure Electronic Signature(s) Signed: 08/18/2022 11:23:10 AM By: [name] Entered By: [name] on 08/17/2022 08:49:47 -------------------------------------------------------------------------------- Debridement Details Patient Name: Date of Service: [name] [name] 08/17/2022 8:15 A M Medical Record Number: [id number] Patient Account Number: [id number] Date of Birth/Sex: Treating RN: 29-Jan-1956 (66 y.o. [name] Primary Care Provider: Manson [name] Other Clinician: Betha [name] Referring Provider: Treating Provider/Extender: [name] Weeks in Treatment: 40 Debridement Performed for Assessment: Wound #1 Right,Plantar Foot Performed By: Physician [name] Corwin, MD Debridement Type: Debridement Severity of Tissue Pre Debridement: Fat layer exposed Level of Consciousness (Pre-procedure): Awake and Alert Pre-procedure Verification/Time Out Yes - 08:43 Taken: Start Time: 08:43 T Area Debrided (L x W): otal 1 (cm) x 1 (cm) = 1 (cm) Tissue and other material debrided: Viable, Non-Viable, Callus, Slough, Subcutaneous, Slough Level: Skin/Subcutaneous Tissue Debridement Description: Excisional Instrument: Curette Bleeding: Minimum Hemostasis Achieved: Pressure End Time: 08:45 Response to Treatment: Procedure was tolerated well Level of Consciousness (Post- Awake and Alert procedure): Post Debridement Measurements of Total Wound Length: (cm) 0.9 Width: (cm) 0.7 Depth: (cm) 0.4 Volume: (cm) 0.198 Character of Wound/Ulcer Post Debridement: Stable Severity of Tissue Post Debridement: Fat layer exposed Post Procedure Diagnosis Same as Pre-procedure Electronic Signature(s) Signed: 08/17/2022  9:48:45 AM By: [name] Corwin DO Signed: 08/17/2022 3:41:25 PM By: [name], BSN, RN, CWS, [name] Signed: 08/18/2022 11:23:10 AM By: [name] Entered By: [name] on 08/17/2022 08:45:24 [name] ([id number]) [id number] Page 3 of 12 -------------------------------------------------------------------------------- HPI Details Patient Name: Date of Service: [name], [name] [name] 08/17/2022 8:15 A M Medical Record Number: [id number] Patient Account Number: [id number] Date of Birth/Sex: Treating RN: 06-May-1956 (66 y.o. [name] Primary Care Provider: Manson [name] Other Clinician: Betha [name] Referring Provider: Treating Provider/Extender: [name] Weeks in Treatment: 40 History of Present Illness HPI  Description: Admission 11/10/2021 [name] is a 66 year old male with a past medical history of uncontrolled type 2 diabetes with last hemoglobin A1c of 8.1 complicated by peripheral neuropathy that presents to the clinic for a 2-year history of nonhealing ulcer to the bottom of his right foot. He states this started out as a blister caused by a work boot. He reports receiving wound care when he resided in [place]. He is reestablishing his wound care in [facility] today. He is currently keeping the area clean and covered. He has insoles designed to help offload the wound bed. He currently denies signs of infection. 1/18; patient presents for follow-up. He has been using Hydrofera Blue to the wound bed. He has no issues or complaints today. He has not received the defender.[name] He denies signs of infection. 2/1; patient presents for follow-up. He has been using Hydrofera Blue to the wound bed he states he received the defender boot and has been using it however he did not have it on today. He currently denies signs of infection to the right foot. Unfortunately he developed a wound to the left great toe over  the past week. He states he received new orthotics and these caused a blister to the left great toe which turned into a wound. He has not been doing anything to the wound bed. He currently denies systemic signs of infection. 2/8; Patient presents for follow-up. 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 He states he is about 90% recovered. He has some mild weakness to the left side. He reports taking the antibiotics prescribed at last clinic visit. He reports using Hydrofera Blue for dressing changes. He only uses the offloading boot when he is at home. He uses open toed shoes to the right foot. He currently denies signs of infection. 2/15; patient presents for follow-up. He states he is still taking the antibiotics prescribed. He reports using the defender boot to the right foot and open toed shoe to the left foot however he is not wearing either today. He currently denies systemic signs of infection. He stated he cut down a tree last week and was outside doing yard work. 2/22; patient presents for follow-up. 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 He reports feeling well. He reports using the defender boot to the right leg and continues to use Hydrofera Blue with dressing changes. He denies systemic signs of infection. 3/8; patient presents for follow-up. He sees Dr. Alberteen Spindle tomorrow for follow-up of the left great toe amputation. This was a partial amputation. He reports he is taking Augmentin currently. He reports increased erythema to the left great toe amputation site. He also reports a decline in his right plantar foot wound. He reports it is draining more. He denies purulent drainage. 3/15; patient presents for follow-up. He did not see Dr. Alberteen Spindle last week but states he sees him today. He states he has been taking doxycycline in the past week and  started gentamicin ointment. He continues to use [facility] with dressing changes. He currently denies systemic signs of infection. 3/22; patient presents for follow-up. Patient had partial amputation of the left great toe on 2/15. 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 Alberteen Spindle. He reports no issues and has no complaints. He is currently taking Keflex and doxycycline prescribed by  Dr. Alberteen Spindle. He continues to use gentamicin ointment and Hydrofera Blue to the right foot wound. He reports using the defender boot when he is at home. 3/29; patient presents for follow-up. He continues to be on antibiotics per podiatry. He has been using Hydrofera Blue and gentamicin ointment to the right plantar foot wound. He has no issues or complaints today. He denies signs of infection. 4/5; patient presents for follow-up. He has been using Dakin's wet-to-dry dressings with improvement to wound healing. He denies signs of infection. She has no issues or complaints today. 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 Use Hydrofera Blue for a prolonged period of time and has been using Dakin's wet-to-dry I think for the last 2 to 3 weeks. He has a Psychologist, forensic at home but he does not wear that coming into the clinic because he drives. He tells me he has been compliant with the defender boot short of when he goes out to drive. He lives alone. He also tells me he has had a previous left great toe amputation We are following him for the right foot wound. 4/26; patient presents for follow-up. He had worsening of his left foot wound and had surgical debridement on 4/16 by Dr. Fanny Skates. He is being followed by podiatry for this issue. He he is on IV Unasyn based on tissue culture by ID.[name] We are following him for the right foot wound. He has been using Dakin's wet-to- dry dressings without issues. He reports continued usage of the defender boot. 5/3; patient presents for  follow-up. He has been using Dakin's wet-to-dry dressings to the right foot wound. He has no issues or complaints today. He reports using the Psychologist, forensic. He reports offloading the foot wound when he is at home by sitting in a recliner. He denies signs of infection. He is still getting IV antibiotics For the left foot wound that is being followed by podiatry. He states the end date is later this month on 5/28. 5/17; patient presents for follow-up. He missed his last clinic appointment. He has been using Dakin's wet-to-dry dressings. He states he is offloading the right foot wound with the defender boot although he does not have this today. He is still on IV antibiotics. He has no issues or complaints today. [name], [name] ([id number]) [id number] Page 4 of 12 5/24; patient presents for follow-up. He has been using Hydrofera Blue to the wound bed without issues. He denies signs of infection. At last clinic visit he had a wound culture done that detected high levels of Enterococcus faecalis and low levels of Corynebacterium stratum and Staphylococcus epidermidis. [facility] antibiotics were ordered. He has not heard from the company yet. 5/31; patient presents for follow-up. He continues to use Hydrofera Blue to the wound bed. He denies signs of infection. [facility]stone antibiotics were ordered at last clinic visit and he states he called the company to pay for it. He states that it is being shipped. We rediscussed the total contact cast and he declines having this placed today. He states he is wearing the defender boot however he never wears it into the office. 04-06-2022 upon evaluation today patient appears to be doing about the same in regard to his wound. This is not significantly smaller. He is not wearing his offloading boot. We discussed that today he tells me that he wears it "all the time as he chuckled and does not have it on during the office visit today. I think it is  increasingly obvious that he is just messing around when he says this based on what I am seeing and otherwise I do not know why he would wear it all the time but not to the clinic. Either way my opinion based on what I am seeing currently is simply that the wound does seem to be still having quite a bit of callus buildup around the edges it is also very dry. He does tell me that he has been using the [facility] since he got it and to be honest I think this is good and should hopefully help keep things under control but at the same time I do not see any evidence of active infection. Again the [employer] is a topical antibiotic with multiple compounds to help prevent further infection and worsening overall. 6/14; patient presents for follow-up. He has been using [facility]stone antibiotics with [employer] daily. He states he is wearing his offloading boot. He does not have this in office today. He is going to the beach for the next 5 days. He states he Will not be using using any offloading device During this time. I again offered the total contact cast however he declined. I did ask him to reconsider for next clinic visit. He currently denies signs of infection. 6/21; patient presents for follow-up. He went to the beach last week. He did not use an offloading device. Wound is slightly deeper today. I again offered the total contact cast but he declined. Currently denies signs of infection. He reports using the [place], hydrofera blue and the Conservation officer, nature. 6/28; patient presents for follow-up. Patient declines total contact cast today. He has been using Keystone and [facility] to the wound bed. It is unclear if he is using the Conservation officer, nature. He would like a letter for the post man to delivers mail to his front door so he does not have to walk down the driveway. 7/5; patient presents for follow-up. He states he is considering the total contact cast however does not want this placed today. He is actually  going to the gym after this appointment. He does not want offloading foot wear for this. He has been using Keystone and [facility] to the wound bed. He denies signs of infection. 7/12; patient presents for follow-up. He is still considering the total contact cast however does not want this today. He has been using Keystone antibiotic and Hydrofera Blue to the wound bed. He denies signs of infection. He has no issues or complaints today. 7/26; patient presents for follow-up. He declines a total contact cast. He has been using his an antibiotic and Hydrofera Blue to the wound bed. He denies systemic signs of infection. He is scheduled to discuss orthotics next week with his podiatrist. 8/2; patient presents for follow-up. He has been using Hydrofera Blue to the wound bed. He has no issues or complaints today. He declines a total contact cast. He states he is getting orthotics today. 8/9; patient presents for follow-up. He has been using Hydrofera Blue to the wound bed. He again declines the total contact cast. He does state that he is considering starting it at the end of the month. He has a vacation coming up and does not want to have it in place for that. 8/16; patient presents for follow-up. He has been using Hydrofera Blue and [facility] to the wound bed. He started taking Augmentin and doxycycline as prescribed. He denies signs of infection. He is going on his vacation next week and  will be back in 2 weeks. He states he is considering the total contact cast for when he comes back from his trip. 8/30; patient present for follow-up. He has been using Hydrofera Blue and [facility] to the wound bed. He continues to take doxycycline and Augmentin without issues. He went on vacation and reports walking for long periods of time without pressure relief to the wound bed. He currently denies systemic signs of infection. 9/6; patient presents for follow-up. He has been using Hydrofera Blue and [facility] to  the wound bed. He is not wearing his offloading boot. He declines a total contact cast. He has finished Augmentin and doxycycline. He had an MRI completed on 07/01/2022 that did not show an underlying abscess, septic arthritis or osteomyelitis. He currently denies systemic signs of infection. 9/13; patient presents for follow-up. Has been using Hydrofera Blue and [facility] to the wound bed. He does not present with his offloading boot. He declines the total contact cast. He has been approved for PuraPly. We discussed this today and he would like this placed at next clinic visit. He denies signs of infection. 9/20; patient presents for follow-up. We have been using Hydrofera Blue and [facility]stone antibiotic ointment. Patient denies signs of infection. PuraPly is available for placement and patient would like to proceed with this. 9/27; patient presents for follow-up. PuraPly #1 was placed in standard fashion to the right foot at last clinic visit. He tolerated this well. Unfortunately has developed a wound to the plantar surface of the left foot. He states he noticed this Sunday. He has pain to the site. He denies purulent drainage increased warmth or erythema. 10/4; patient presents for follow-up. PuraPly #2 was placed in standard fashion to the right foot at last clinic visit. He started taking Augmentin and doxycycline prescribed at last clinic visit due to findings of infection to the left foot. He has been doing Dakin's wet-to-dry to this area. He reports stability in his symptoms. He denies systemic signs of infection. 10/11; patient presents for follow-up. PuraPly #3 was placed in standard fashion to the right foot at last clinic visit. Unfortunately the clinic did not order another PuraPly for this week. He continues to take Augmentin and doxycycline. He still has pain in the left foot. He had an x-ray that did not show radiographic evidence of osteomyelitis. He still has serous drainage on exam. He  is doing Dakin's wet-to-dry dressings to this area. 10/18; patient presents for follow-up. He has been using Hydrofera Blue and [facility] to the right foot. PuraPly is available for placement today. He has been taking levofloxacin for the past week. He continues to have pain in the left foot with increased redness and swelling. An MRI was ordered at last clinic visit this has not been done yet. He has been doing Dakin's wet-to-dry dressings to the left foot. Electronic Signature(s) Signed: 08/17/2022 9:48:45 AM By: [name] Corwin DO Entered By: [name] on 08/17/2022 09:02:27 [name] ([id number]) [id number] Page 5 of 12 -------------------------------------------------------------------------------- Physical Exam Details Patient Name: Date of ServiceMarland Kitchen [name], [name] [name] 08/17/2022 8:15 A M Medical Record Number: [id number] Patient Account Number: [id number] Date of Birth/Sex: Treating RN: 1956/08/17 (66 y.o. [name] Primary Care Provider: Manson [name] Other Clinician: Betha [name] Referring Provider: Treating Provider/Extender: [name] Weeks in Treatment: 40 Constitutional . Cardiovascular . Psychiatric . Notes Right foot: T the plantar aspect of the first metatarsal head there is an open wound with granulation tissue, nonviable tissue and callus.  No signs of infection o to the right foot. Left foot: T the third submetatarsal there is an open wound that probes closely to bone. There is increased warmth and erythema but no purulent drainage. o Electronic Signature(s) Signed: 08/17/2022 9:48:45 AM By: [name] CorwinHoffman, Chara Marquard DO Entered By: [name] CorwinHoffman, Anaissa Macfadden on 08/17/2022 09:03:48 -------------------------------------------------------------------------------- Physician Orders Details Patient Name: Date of Service: [name] MealyNEELY, [name] 08/17/2022 8:15 A M Medical Record Number: [id number] Patient Account Number:  [id number] Date of Birth/Sex: Treating RN: 12-29-1955 (66 y.o. [name] HolmsM) Woody, [name] Primary Care Provider: Manson AllanFields, Glenda Other Clinician: Betha LoaVenable, Angie Referring Provider: Treating Provider/Extender: [name] ScotlandHoffman, Zaley Talley Fields, Glenda Weeks in Treatment: 4740 Verbal / Phone Orders: No Diagnosis Coding Follow-up Appointments Return Appointment in 1 week. Nurse Visit as needed Bathing/ Shower/ Hygiene May shower; gently cleanse wound with antibacterial soap, rinse and pat dry prior to dressing wounds No tub bath. Anesthetic (Use 'Patient Medications' Section for Anesthetic Order Entry) Lidocaine applied to wound bed Struss, Rohaan ([id number]) [id number] Page 6 of 12 Cellular or Tissue Based Products Cellular or Tissue Based Product Type: - Puraply application #4 applied right foot Edema Control - Lymphedema / Segmental Compressive Device / Other Elevate, Exercise Daily and A void Standing for Long Periods of Time. Elevate leg(s) parallel to the floor when sitting. DO YOUR BEST to sleep in the bed at night. DO NOT sleep in your recliner. Long hours of sitting in a recliner leads to swelling of the legs and/or potential wounds on your backside. Off-Loading Foot Defender - right foot-keep shin covered-keep pressure off of the wound (Patient is not wear boot to clinic each week. Wearing tennis shoe) Additional Orders / Instructions Follow Nutritious Diet and Increase Protein Intake - monitor blood sugar to maintain normal limits Other: - Follow up post surgery for left foot-[facility] handling Medications-Please add to medication list. ntibiotics - continue levoquin P.O. A Wound Treatment Wound #1 - Foot Wound Laterality: Plantar, Right Cleanser: Wound Cleanser Every Other Day/30 Days Discharge Instructions: Wash your hands with soap and water. Remove old dressing, discard into plastic bag and place into trash. Cleanse the wound with Wound Cleanser prior to  applying a clean dressing using gauze sponges, not tissues or cotton balls. Do not scrub or use excessive force. Pat dry using gauze sponges, not tissue or cotton balls. Prim Dressing: PuraPly Every Other Day/30 Days ary Discharge Instructions: #3 Secondary Dressing: Zetuvit Plus 4x4 (in/in) (Generic) Every Other Day/30 Days Secured With: Conform 4'' - Conforming Stretch Gauze Bandage 4x75 (in/in) Every Other Day/30 Days Discharge Instructions: Apply as directed Wound #3 - Foot Wound Laterality: Plantar, Left Cleanser: Dakin 16 (oz) 0.25 Every Other Day/30 Days Discharge Instructions: wet to dry dressing Cleanser: Wound Cleanser Every Other Day/30 Days Discharge Instructions: Wash your hands with soap and water. Remove old dressing, discard into plastic bag and place into trash. Cleanse the wound with Wound Cleanser prior to applying a clean dressing using gauze sponges, not tissues or cotton balls. Do not scrub or use excessive force. Pat dry using gauze sponges, not tissue or cotton balls. Prim Dressing: (BORDER) Zetuvit Plus Silicone Border Dressing 4x4 (in/in) ary Every Other Day/30 Days Secured With: Conform 4'' - Conforming Stretch Gauze Bandage 4x75 (in/in) Every Other Day/30 Days Discharge Instructions: Apply as directed Radiology MRI with and without Contrast - order MRI left foot w and w/o contrast Notes Patient instructed to go to ER for further evaluation of left foot infection Electronic Signature(s) Signed: 08/17/2022 9:48:45 AM By: [name] CorwinHoffman, Keshon Markovitz DO  Entered By: [name] on 08/17/2022 09:10:31 -------------------------------------------------------------------------------- Problem List Details Patient Name: Date of Service: GATES, JIVIDEN [name] 08/17/2022 8:15 A [name], [name] ([id number]) [id number] Page 7 of 12 Medical Record Number: [id number] Patient Account Number: [id number] Date of Birth/Sex: Treating RN: [birth year] (66 y.o. [name] Primary Care Provider: Manson [name] Other Clinician: Betha [name] Referring Provider: Treating Provider/Extender: [name] Weeks in Treatment: 25 Active Problems ICD-10 Encounter Code Description Active Date MDM Diagnosis [id number] 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ed 11/10/2021 No Yes E11.621 Type 2 diabetes mellitus with foot ulcer 11/10/2021 No Yes E11.40 Type 2 diabetes mellitus with diabetic neuropathy, unspecified 11/10/2021 No Yes R26.89 Other abnormalities of gait and mobility 11/10/2021 No Yes S91.302A Unspecified open wound, left foot, initial encounter 07/27/2022 No Yes M79.672 Pain in left foot 08/10/2022 No Yes Inactive Problems ICD-10 Code Description Active Date Inactive Date L97.528 Non-pressure chronic ulcer of other part of left foot with other specified severity 12/01/2021 12/01/2021 Resolved Problems Electronic Signature(s) Signed: 08/17/2022 9:48:45 AM By: [name] Corwin DO Entered By: [name] on 08/17/2022 09:01:22 -------------------------------------------------------------------------------- Progress Note Details Patient Name: Date of Service: [name] [name] 08/17/2022 8:15 A M Medical Record Number: [id number] Patient Account Number: [id number] Date of Birth/Sex: Treating RN: 19-May-1956 (66 y.o. [name] Primary Care Provider: Manson [name] Other Clinician: Betha [name] Referring Provider: Treating Provider/Extender: [name] Weeks in Treatment: [address] [place], [place] ([id number]) [id number] Page 8 of 12 Chief Complaint Information obtained from Patient Right plantar foot wound History of Present Illness (HPI) Admission 11/10/2021 Mr. Demonta Wombles is a 66 year old male with a past medical history of uncontrolled type 2 diabetes with last hemoglobin A1c of 8.1 complicated by peripheral neuropathy that  presents to the clinic for a 2-year history of nonhealing ulcer to the bottom of his right foot. He states this started out as a blister caused by a work boot. He reports receiving wound care when he resided in [place]. He is reestablishing his wound care in [facility] today. He is currently keeping the area clean and covered. He has insoles designed to help offload the wound bed. He currently denies signs of infection. 1/18; patient presents for follow-up. He has been using Hydrofera Blue to the wound bed. He has no issues or complaints today. He has not received the defender.[name] He denies signs of infection. 2/1; patient presents for follow-up. He has been using Hydrofera Blue to the wound bed he states he received the defender boot and has been using it however he did not have it on today. He currently denies signs of infection to the right foot. Unfortunately he developed a wound to the left great toe over the past week. He states he received new orthotics and these caused a blister to the left great toe which turned into a wound. He has not been doing anything to the wound bed. He currently denies systemic signs of infection. 2/8; Patient presents for follow-up. 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 He states he is about 90% recovered. He has some mild weakness to the left side. He reports taking the antibiotics prescribed at last clinic visit. He reports using Hydrofera Blue for dressing changes. He only uses the offloading boot when he is at home. He uses open toed shoes to the right  foot. He currently denies signs of infection. 2/15; patient presents for follow-up. He states he is still taking the antibiotics prescribed. He reports using the defender boot to the right foot and open toed shoe to the left foot however he is not wearing either today. He currently  denies systemic signs of infection. He stated he cut down a tree last week and was outside doing yard work. 2/22; patient presents for follow-up. 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 He reports feeling well. He reports using the defender boot to the right leg and continues to use Hydrofera Blue with dressing changes. He denies systemic signs of infection. 3/8; patient presents for follow-up. He sees Dr. Alberteen Spindle tomorrow for follow-up of the left great toe amputation. This was a partial amputation. He reports he is taking Augmentin currently. He reports increased erythema to the left great toe amputation site. He also reports a decline in his right plantar foot wound. He reports it is draining more. He denies purulent drainage. 3/15; patient presents for follow-up. He did not see Dr. Alberteen Spindle last week but states he sees him today. He states he has been taking doxycycline in the past week and started gentamicin ointment. He continues to use [facility] with dressing changes. He currently denies systemic signs of infection. 3/22; patient presents for follow-up. Patient had partial amputation of the left great toe on 2/15. 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 Alberteen Spindle. He reports no issues and has no complaints. He is currently taking Keflex and doxycycline prescribed by Dr. Alberteen Spindle. He continues to use gentamicin ointment and Hydrofera Blue to the right foot wound. He reports using the defender boot when he is at home. 3/29; patient presents for follow-up. He continues to be on antibiotics per podiatry. He has been using Hydrofera Blue and gentamicin ointment to the right plantar foot wound. He has no issues or complaints today. He denies signs of infection. 4/5; patient presents for follow-up. He has been using Dakin's wet-to-dry dressings with improvement to wound healing. He denies signs of infection. She has no issues or complaints  today. 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 Use Hydrofera Blue for a prolonged period of time and has been using Dakin's wet-to-dry I think for the last 2 to 3 weeks. He has a Psychologist, forensic at home but he does not wear that coming into the clinic because he drives. He tells me he has been compliant with the defender boot short of when he goes out to drive. He lives alone. He also tells me he has had a previous left great toe amputation We are following him for the right foot wound. 4/26; patient presents for follow-up. He had worsening of his left foot wound and had surgical debridement on 4/16 by Dr. Fanny Skates. He is being followed by podiatry for this issue. He he is on IV Unasyn based on tissue culture by ID.[name] We are following him for the right foot wound. He has been using Dakin's wet-to- dry dressings without issues. He reports continued usage of the defender boot. 5/3; patient presents for follow-up. He has been using Dakin's wet-to-dry dressings to the right foot wound. He has no issues or complaints today. He reports using the Psychologist, forensic. He reports offloading the foot wound when he is at home by sitting in a recliner. He denies signs of infection. He is still getting IV antibiotics For the left foot  wound that is being followed by podiatry. He states the end date is later this month on 5/28. 5/17; patient presents for follow-up. He missed his last clinic appointment. He has been using Dakin's wet-to-dry dressings. He states he is offloading the right foot wound with the defender boot although he does not have this today. He is still on IV antibiotics. He has no issues or complaints today. 5/24; patient presents for follow-up. He has been using Hydrofera Blue to the wound bed without issues. He denies signs of infection. At last clinic visit he had a wound culture done that detected high levels of Enterococcus faecalis and low levels of Corynebacterium stratum  and Staphylococcus epidermidis. [facility] antibiotics were ordered. He has not heard from the company yet. 5/31; patient presents for follow-up. He continues to use Hydrofera Blue to the wound bed. He denies signs of infection. [facility]stone antibiotics were ordered at last clinic visit and he states he called the company to pay for it. He states that it is being shipped. We rediscussed the total contact cast and he declines having this placed today. He states he is wearing the defender boot however he never wears it into the office. 04-06-2022 upon evaluation today patient appears to be doing about the same in regard to his wound. This is not significantly smaller. He is not wearing his offloading boot. We discussed that today he tells me that he wears it "all the time as he chuckled and does not have it on during the office visit today. I think it is increasingly obvious that he is just messing around when he says this based on what I am seeing and otherwise I do not know why he would wear it all the time but not to the clinic. Either way my opinion based on what I am seeing currently is simply that the wound does seem to be still having quite a bit of callus buildup around the edges it is also very dry. He does tell me that he has been using the [facility] since he got it and to be honest I think this is good and should hopefully help keep things under control but at the same time I do not see any evidence of active infection. Again the Jodie Echevaria is a topical antibiotic with multiple compounds to help prevent further infection and worsening overall. 6/14; patient presents for follow-up. He has been using Keystone antibiotics with [employer]	[place] daily. He states he is wearing his offloading boot. He does not have this in office today. He is going to the beach for the next 5 days. He states he Will not be using using any offloading device During this time. I again offered the total contact cast however he  declined. I did ask him to reconsider for next clinic visit. He currently denies signs of infection. 6/21; patient presents for follow-up. He went to the beach last week. He did not use an offloading device. Wound is slightly deeper today. I again offered the total contact cast but he declined. Currently denies signs of infection. He reports using the Peculiar, hydrofera blue and the Psychologist, forensic. 6/28; patient presents for follow-up. Patient declines total contact cast today. He has been using Keystone and [facility] to the wound bed. It is unclear if he is using the Psychologist, forensic. He would like a letter for the post man to delivers mail to his front door so he does not have to walk down the driveway. [name], [name] ([id number]) [id number] Page  9 of 12 7/5; patient presents for follow-up. He states he is considering the total contact cast however does not want this placed today. He is actually going to the gym after this appointment. He does not want offloading foot wear for this. He has been using Keystone and [facility] to the wound bed. He denies signs of infection. 7/12; patient presents for follow-up. He is still considering the total contact cast however does not want this today. He has been using Keystone antibiotic and Hydrofera Blue to the wound bed. He denies signs of infection. He has no issues or complaints today. 7/26; patient presents for follow-up. He declines a total contact cast. He has been using his an antibiotic and Hydrofera Blue to the wound bed. He denies systemic signs of infection. He is scheduled to discuss orthotics next week with his podiatrist. 8/2; patient presents for follow-up. He has been using Hydrofera Blue to the wound bed. He has no issues or complaints today. He declines a total contact cast. He states he is getting orthotics today. 8/9; patient presents for follow-up. He has been using Hydrofera Blue to the wound bed. He  again declines the total contact cast. He does state that he is considering starting it at the end of the month. He has a vacation coming up and does not want to have it in place for that. 8/16; patient presents for follow-up. He has been using Hydrofera Blue and [facility] to the wound bed. He started taking Augmentin and doxycycline as prescribed. He denies signs of infection. He is going on his vacation next week and will be back in 2 weeks. He states he is considering the total contact cast for when he comes back from his trip. 8/30; patient present for follow-up. He has been using Hydrofera Blue and [facility]stone to the wound bed. He continues to take doxycycline and Augmentin without issues. He went on vacation and reports walking for long periods of time without pressure relief to the wound bed. He currently denies systemic signs of infection. 9/6; patient presents for follow-up. He has been using Hydrofera Blue and [facility]stone to the wound bed. He is not wearing his offloading boot. He declines a total contact cast. He has finished Augmentin and doxycycline. He had an MRI completed on 07/01/2022 that did not show an underlying abscess, septic arthritis or osteomyelitis. He currently denies systemic signs of infection. 9/13; patient presents for follow-up. Has been using Hydrofera Blue and [facility] to the wound bed. He does not present with his offloading boot. He declines the total contact cast. He has been approved for PuraPly. We discussed this today and he would like this placed at next clinic visit. He denies signs of infection. 9/20; patient presents for follow-up. We have been using Hydrofera Blue and [facility]stone antibiotic ointment. Patient denies signs of infection. PuraPly is available for placement and patient would like to proceed with this. 9/27; patient presents for follow-up. PuraPly #1 was placed in standard fashion to the right foot at last clinic visit. He tolerated this well. Unfortunately  has developed a wound to the plantar surface of the left foot. He states he noticed this Sunday. He has pain to the site. He denies purulent drainage increased warmth or erythema. 10/4; patient presents for follow-up. PuraPly #2 was placed in standard fashion to the right foot at last clinic visit. He started taking Augmentin and doxycycline prescribed at last clinic visit due to findings of infection to the left foot. He has been  doing Dakin's wet-to-dry to this area. He reports stability in his symptoms. He denies systemic signs of infection. 10/11; patient presents for follow-up. PuraPly #3 was placed in standard fashion to the right foot at last clinic visit. Unfortunately the clinic did not order another PuraPly for this week. He continues to take Augmentin and doxycycline. He still has pain in the left foot. He had an x-ray that did not show radiographic evidence of osteomyelitis. He still has serous drainage on exam. He is doing Dakin's wet-to-dry dressings to this area. 10/18; patient presents for follow-up. He has been using Hydrofera Blue and [facility] to the right foot. PuraPly is available for placement today. He has been taking levofloxacin for the past week. He continues to have pain in the left foot with increased redness and swelling. An MRI was ordered at last clinic visit this has not been done yet. He has been doing Dakin's wet-to-dry dressings to the left foot. Objective Constitutional Vitals Time Taken: 8:12 AM, Height: 74 in, Weight: 226 lbs, BMI: 29, Temperature: 98.2 F, Pulse: 97 bpm, Respiratory Rate: 18 breaths/min, Blood Pressure: 116/77 mmHg. General Notes: Right foot: T the plantar aspect of the first metatarsal head there is an open wound with granulation tissue, nonviable tissue and callus. No o signs of infection to the right foot. Left foot: T the third submetatarsal there is an open wound that probes closely to bone. There is increased warmth and o erythema but  no purulent drainage. Integumentary (Hair, Skin) Wound #1 status is Open. Original cause of wound was Blister. The date acquired was: 07/15/2020. The wound has been in treatment 40 weeks. The wound is located on the Right,Plantar Foot. The wound measures 0.9cm length x 0.7cm width x 0.3cm depth; 0.495cm^2 area and 0.148cm^3 volume. There is Fat Layer (Subcutaneous Tissue) exposed. There is a medium amount of serosanguineous drainage noted. The wound margin is thickened. There is large (67-100%) red granulation within the wound bed. There is a small (1-33%) amount of necrotic tissue within the wound bed including Adherent Slough. Wound #3 status is Open. Original cause of wound was Pressure Injury. The date acquired was: 07/24/2022. The wound has been in treatment 3 weeks. The wound is located on the Left,Plantar Foot. The wound measures 1.2cm length x 0.9cm width x 2.1cm depth; 0.848cm^2 area and 1.781cm^3 volume. There is Fat Layer (Subcutaneous Tissue) exposed. There is a medium amount of serosanguineous drainage noted. The wound margin is flat and intact. There is no granulation within the wound bed. There is a small (1-33%) amount of necrotic tissue within the wound bed. Assessment Active Problems ICD-10 [name], [name] ([id number]) [id number] Page 10 of 12 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ed Type 2 diabetes mellitus with foot ulcer Type 2 diabetes mellitus with diabetic neuropathy, unspecified Other abnormalities of gait and mobility Unspecified open wound, left foot, initial encounter Pain in left foot Patient's right foot wound appears well-healing. I debrided nonviable tissue and PuraPly #4 was placed in standard fashion today to this wound bed.[name] Unfortunately the left foot wound has not shown improvement despite Several oral antibiotics including Augmentin, doxycycline and levofloxacin As well as dressings. T oday there  is more swelling and increased warmth and erythema to the periwound. I recommended he go to the ED. He expressed understanding and stated he was going after his clinic visit today Procedures Wound #1 Pre-procedure diagnosis of Wound #1 is a Diabetic Wound/Ulcer of the Lower Extremity located on  the Right,Plantar Foot .Severity of Tissue Pre Debridement is: Fat layer exposed. There was a Excisional Skin/Subcutaneous Tissue Debridement with a total area of 1 sq cm performed by [name] Corwin, MD. With the following instrument(s): Curette to remove Viable and Non-Viable tissue/material. Material removed includes Callus, Subcutaneous Tissue, and Slough. A time out was conducted at 08:43, prior to the start of the procedure. A Minimum amount of bleeding was controlled with Pressure. The procedure was tolerated well. Post Debridement Measurements: 0.9cm length x 0.7cm width x 0.4cm depth; 0.198cm^3 volume. Character of Wound/Ulcer Post Debridement is stable. Severity of Tissue Post Debridement is: Fat layer exposed. Post procedure Diagnosis Wound #1: Same as Pre-Procedure Pre-procedure diagnosis of Wound #1 is a Diabetic Wound/Ulcer of the Lower Extremity located on the Right,Plantar Foot. A skin graft procedure using a bioengineered skin substitute/cellular or tissue based product was performed by [name] Corwin, MD with the following instrument(s): Forceps and Scissors. Puraply AM was applied and secured with Steri-Strips. 2 sq cm of product was utilized and 2 sq cm was wasted due to wound size. Post Application, bolster was applied. A Time Out was conducted at 08:47, prior to the start of the procedure. The procedure was tolerated well. Post procedure Diagnosis Wound #1: Same as Pre-Procedure . Plan Follow-up Appointments: Return Appointment in 1 week. Nurse Visit as needed Bathing/ Shower/ Hygiene: May shower; gently cleanse wound with antibacterial soap, rinse and pat dry prior to dressing  wounds No tub bath. Anesthetic (Use 'Patient Medications' Section for Anesthetic Order Entry): Lidocaine applied to wound bed Cellular or Tissue Based Products: Cellular or Tissue Based Product Type: - Puraply application #4 applied right foot Edema Control - Lymphedema / Segmental Compressive Device / Other: Elevate, Exercise Daily and Avoid Standing for Long Periods of Time. Elevate leg(s) parallel to the floor when sitting. DO YOUR BEST to sleep in the bed at night. DO NOT sleep in your recliner. Long hours of sitting in a recliner leads to swelling of the legs and/or potential wounds on your backside. Off-Loading: Foot Defender  - right foot-keep shin covered-keep pressure off of the wound (Patient is not wear boot to clinic each week. Wearing tennis shoe) Additional Orders / Instructions: Follow Nutritious Diet and Increase Protein Intake - monitor blood sugar to maintain normal limits Other: - Follow up post surgery for left foot-[facility] handling Medications-Please add to medication list.: P.O. Antibiotics - continue levoquin Radiology ordered were: MRI with and without Contrast - order MRI left foot w and w/o contrast General Notes: Patient instructed to go to ER for further evaluation of left foot infection WOUND #1: - Foot Wound Laterality: Plantar, Right Cleanser: Wound Cleanser Every Other Day/30 Days Discharge Instructions: Wash your hands with soap and water. Remove old dressing, discard into plastic bag and place into trash. Cleanse the wound with Wound Cleanser prior to applying a clean dressing using gauze sponges, not tissues or cotton balls. Do not scrub or use excessive force. Pat dry using gauze sponges, not tissue or cotton balls. Prim Dressing: PuraPly Every Other Day/30 Days ary Discharge Instructions: #3 Secondary Dressing: Zetuvit Plus 4x4 (in/in) (Generic) Every Other Day/30 Days Secured With: Conform 4'' - Conforming Stretch Gauze Bandage 4x75  (in/in) Every Other Day/30 Days Discharge Instructions: Apply as directed WOUND #3: - Foot Wound Laterality: Plantar, Left Cleanser: Dakin 16 (oz) 0.25 Every Other Day/30 Days Discharge Instructions: wet to dry dressing Cleanser: Wound Cleanser Every Other Day/30 Days Discharge Instructions: Wash your hands with soap  and water. Remove old dressing, discard into plastic bag and place into trash. Cleanse the wound with Wound Cleanser prior to applying a clean dressing using gauze sponges, not tissues or cotton balls. Do not scrub or use excessive force. Pat dry using gauze sponges, not tissue or cotton balls. Prim Dressing: (BORDER) Zetuvit Plus Silicone Border Dressing 4x4 (in/in) Every Other Day/30 Days ary Secured With: Conform 4'' - Conforming Stretch Gauze Bandage 4x75 (in/in) Every Other Day/30 Days Discharge Instructions: Apply as directed [name], [name] ([id number]) [id number] Page 11 of 12 1. In office sharp debridement 2. PuraPly #4 placed in standard fashion to the right foot 3. Go to the ED regarding the left foot wound for possible admission with further imaging and IV antibiotics Electronic Signature(s) Signed: 08/17/2022 9:48:45 AM By: [name] Corwin DO Entered By: [name] on 08/17/2022 09:08:30 -------------------------------------------------------------------------------- SuperBill Details Patient Name: Date of Service: [name] [name] 08/17/2022 Medical Record Number: [id number] Patient Account Number: [id number] Date of Birth/Sex: Treating RN: [birth year] (66 y.o. Loel Lofty, Selena Batten Primary Care Provider: Manson [name] Other Clinician: Betha [name] Referring Provider: Treating Provider/Extender: [name] Weeks in Treatment: 40 Diagnosis Coding ICD-10 Codes Code Description [phone] 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ed E11.621 Type 2 diabetes mellitus with foot  ulcer E11.40 Type 2 diabetes mellitus with diabetic neuropathy, unspecified R26.89 Other abnormalities of gait and mobility S91.302A Unspecified open wound, left foot, initial encounter M79.672 Pain in left foot Facility Procedures : CPT4 Code: [id number] Description: 15275 - SKIN SUB GRAFT FACE/NK/HF/G ICD-10 Diagnosis Description L97.512 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ed E11.621 Type 2 diabetes mellitus with foot ulcer Modifier: Quantity: 1 : CPT4 Code: [id number] Description: Q4196 PuraPly Product AM 2X2 (4sq CM) Modifier: Quantity: 4 Physician Procedures : CPT4 Code Description Modifier [id number] - WC PHYS LEVEL 4 - EST PT ICD-10 Diagnosis Description M79.672 Pain in left foot S91.302A Unspecified open wound, left foot, initial encounter E11.621 Type 2 diabetes mellitus with foot ulcer E11.40 Type 2  diabetes mellitus with diabetic neuropathy, unspecified Quantity: 1 Electronic Signature(s) Signed: 08/17/2022 11:58:41 AM By: [name] Corwin DO Signed: 08/18/2022 11:23:10 AM By: [name] Previous Signature: 08/17/2022 9:48:45 AM Version By: [name] Corwin DO Entered By: [name] on 08/17/2022 11:58:05

## 2022-08-18 NOTE — Progress Notes (Signed)
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 Continue on IV ceftriaxone, flagyl & vanco. Will d/c home on po doxycycline x 10 days as per podiatry. Non-weight bearing of LLE. No dressing changes until pt f/u outpatient w/ podiatry in 1 week    DM2: likely poorly controlled, 9.9 in 05/2022. Continue on SSI w/ accuchecks   CKDIIIa: baseline Cr 1.1-1.5. Cr is labile    Depression: severity unknown. Continue on home dose of seroquel, requip    Gout: continue on home dose of allopurinol    Chronic diastolic CHF: echo on 03/05/8126 showed EF of 50-55% with grade 2 diastolic dysfunction. CHF seem to be compensated. Monitor I/Os   Hx of CVA: continue on statin   HLD: continue on statin         DVT prophylaxis: heparin SQ Code Status: full  Family Communication:  Disposition Plan: likely d/c back home   Level of care: Med-Surg  Status is: Inpatient Remains inpatient appropriate because: s/p surgery as stated above today. Possibly d/c home tomorrow    Consultants:  Podiatry   Procedures:  Antimicrobials: rocephin, flagyl, vanco   Subjective: Pt c/o nerve pain b/l LE  Objective: Vitals:   08/17/22 1518 08/17/22 1524 08/17/22 1726 08/17/22 2316  BP: 108/69  104/66 119/72  Pulse: 68  76 76  Resp: 18  18 20   Temp:  (!) 97.4 F (36.3 C) 97.6 F  (36.4 C) 98.7 F (37.1 C)  TempSrc:  Oral    SpO2: 98%  99% 97%  Weight:      Height:        Intake/Output Summary (Last 24 hours) at 08/18/2022 0754 Last data filed at 08/18/2022 0314 Gross per 24 hour  Intake 1600 ml  Output 0 ml  Net 1600 ml   Filed Weights   08/17/22 0946  Weight: 102.1 kg    Examination:  General exam: Appears calm but uncomfortable  Respiratory system: Clear to auscultation. Respiratory effort normal. Cardiovascular system: S1 & S2+. No rubs, gallops or clicks.  Gastrointestinal system: Abdomen is nondistended, soft and nontender. Normal bowel sounds heard. Central nervous system: Alert and oriented. Moves all extremities  Psychiatry: Judgement and insight appear normal. Mood & affect appropriate.     Data Reviewed: I have personally reviewed following labs and imaging studies  CBC: Recent Labs  Lab 08/17/22 0936 08/18/22 0434  WBC 7.8 6.2  NEUTROABS 5.2  --   HGB 13.3 11.9*  HCT 42.8 36.8*  MCV 86.1 84.8  PLT 313 517   Basic Metabolic Panel: Recent Labs  Lab 08/17/22 0936 08/18/22 0434  NA 139 137  K 4.5 3.8  CL 103 105  CO2 27 25  GLUCOSE 149* 149*  BUN 24* 24*  CREATININE 1.42* 1.23  CALCIUM 9.9 9.0   GFR: Estimated Creatinine Clearance: 74.4 mL/min (by C-G formula based  on SCr of 1.23 mg/dL). Liver Function Tests: Recent Labs  Lab 08/17/22 0936  AST 14*  ALT 13  ALKPHOS 77  BILITOT 0.7  PROT 8.5*  ALBUMIN 3.9   No results for input(s): "LIPASE", "AMYLASE" in the last 168 hours. No results for input(s): "AMMONIA" in the last 168 hours. Coagulation Profile: Recent Labs  Lab 08/17/22 1419  INR 1.0   Cardiac Enzymes: No results for input(s): "CKTOTAL", "CKMB", "CKMBINDEX", "TROPONINI" in the last 168 hours. BNP (last 3 results) No results for input(s): "PROBNP" in the last 8760 hours. HbA1C: No results for input(s): "HGBA1C" in the last 72 hours. CBG: Recent Labs  Lab 08/17/22 1241 08/17/22 1758  08/17/22 2104  GLUCAP 117* 148* 145*   Lipid Profile: No results for input(s): "CHOL", "HDL", "LDLCALC", "TRIG", "CHOLHDL", "LDLDIRECT" in the last 72 hours. Thyroid Function Tests: No results for input(s): "TSH", "T4TOTAL", "FREET4", "T3FREE", "THYROIDAB" in the last 72 hours. Anemia Panel: No results for input(s): "VITAMINB12", "FOLATE", "FERRITIN", "TIBC", "IRON", "RETICCTPCT" in the last 72 hours. Sepsis Labs: Recent Labs  Lab 08/17/22 0936  LATICACIDVEN 1.6    Recent Results (from the past 240 hour(s))  Culture, blood (Routine X 2) w Reflex to ID Panel     Status: None (Preliminary result)   Collection Time: 08/17/22  9:36 AM   Specimen: BLOOD  Result Value Ref Range Status   Specimen Description BLOOD RIGHT ANTECUBITAL  Final   Special Requests   Final    BOTTLES DRAWN AEROBIC AND ANAEROBIC Blood Culture adequate volume   Culture   Final    NO GROWTH < 24 HOURS Performed at [facility], [address], [place], [place] [address]    Report Status PENDING  Incomplete  Culture, blood (Routine X 2) w Reflex to ID Panel     Status: None (Preliminary result)   Collection Time: 08/17/22  2:19 PM   Specimen: BLOOD  Result Value Ref Range Status   Specimen Description BLOOD RIGHT ANTECUBITAL  Final   Special Requests   Final    BOTTLES DRAWN AEROBIC AND ANAEROBIC Blood Culture adequate volume   Culture   Final    NO GROWTH < 24 HOURS Performed at [facility], [address] [place], [place] [address]    Report Status PENDING  Incomplete         Radiology Studies: MR FOOT LEFT W WO CONTRAST  Result Date: 08/17/2022 CLINICAL DATA:  Diabetic foot pain EXAM: MRI OF THE LEFT FOREFOOT WITHOUT AND WITH CONTRAST TECHNIQUE: Multiplanar, multisequence MR imaging of the left forefoot was performed both before and after administration of intravenous contrast. CONTRAST:  13mL GADAVIST GADOBUTROL 1 MMOL/ML IV SOLN COMPARISON:  X-ray 08/03/2022 FINDINGS:  Bones/Joint/Cartilage Postsurgical changes from transmetatarsal amputation of the first ray at the level of the mid diaphysis. There is acute osteomyelitis involving the distal 1.5 cm of the residual first metatarsal shaft. Acute osteomyelitis of the second metatarsal extending from the level of the second metatarsal head to the proximal metaphysis. Acute osteomyelitis of the second toe proximal phalanx. Dorsal dislocation of the second MTP joint with complex second MTP joint effusion compatible with septic arthritis. Acute osteomyelitis of the third metatarsal extending from the metatarsal head to the mid diaphysis. Pathologic fracture of the third metatarsal neck. Acute osteomyelitis of the third toe proximal phalanx. Mild bone marrow edema within the fourth toe proximal phalanx may be reactive or secondary to early osteomyelitis. Bones of the midfoot demonstrate degenerative findings with mild  reactive subchondral marrow edema. No findings to suggest osteomyelitis or septic arthritis within the midfoot. Ligaments Intact Lisfranc ligament. Disruption of the second MTP joint capsule. Muscles and Tendons Denervation changes of the foot musculature with findings of myositis. Tenosynovitis of the second toe flexor tendon. Soft tissues Complex abscess centered at the second MTP joint contiguous with a plantar ulceration. Collection measures approximately 4.1 x 2.2 x 4.0 cm. Extensive changes of cellulitis throughout the distal forefoot. IMPRESSION: 1. Acute osteomyelitis of the first, second, and third metatarsals as well as the proximal phalanxes of the second and third toes. Reactive osteitis or early osteomyelitis of the fourth toe proximal phalanx. 2. Complex abscess centered at the second MTP joint contiguous with a plantar ulceration. Collection measures approximately 4.1 x 2.2 x 4.0 cm. 3. Dorsal dislocation of the second MTP joint with complex second MTP joint effusion compatible with septic arthritis. 4.  Tenosynovitis of the second toe flexor tendon. Electronically Signed   By: Davina Poke D.O.   On: 08/17/2022 13:49        Scheduled Meds:  allopurinol  300 mg Oral Daily   atorvastatin  80 mg Oral Daily   gabapentin  400 mg Oral TID   heparin  5,000 Units Subcutaneous Q8H   insulin aspart  0-5 Units Subcutaneous QHS   insulin aspart  0-9 Units Subcutaneous TID WC   lisinopril  5 mg Oral Daily   QUEtiapine  25 mg Oral QHS   rOPINIRole  0.5 mg Oral QHS   Continuous Infusions:  cefTRIAXone (ROCEPHIN)  IV Stopped (08/17/22 1408)   metronidazole Stopped (08/17/22 2234)   vancomycin       LOS: 1 day    Time spent: 35 mins     Wyvonnia Dusky, MD [facility] Pager [phone]  If 7PM-7AM, please contact night-coverage [email or web address] 08/18/2022, 7:54 AM

## 2022-08-18 NOTE — Progress Notes (Signed)
Pt dressing began to soak through with blood and started to drip in floor when pt sat up to keep from getting blood in bed.  Pt was told to lay back and nursing elevated foot in pillows.  Bleeding slowed down and Dr. Posey Pronto contacted, he advised to change dressing with 4x4 gauze, kerlix, ABD pads, and Ace bandage tightly around incision.  Nurse changed dressing and noted a moderate amount of blood oozing from surgical site.  Nursing was advised to notify Posey Pronto again if dressing soaked through withing the hour.  Pt was instructed to keep foot elevated on pillow and notify staff if he noticed bleeding at any point between rounding.  Will monitor.

## 2022-08-18 NOTE — Progress Notes (Signed)
[name] ([id number]) [id number] Page 1 of 10 Visit Report for 08/10/2022 Chief Complaint Document Details Patient Name: Date of Service: [name], [name] 08/10/2022 8:15 A M Medical Record Number: [id number] Patient Account Number: [id number] Date of Birth/Sex: Treating RN: [birth year] (65 y.o. [name]) Yevonne Pax Primary Care Provider: Manson Allan Other Clinician: Betha Loa Referring Provider: Treating Provider/Extender: Collins Scotland Weeks in Treatment: 72 Information Obtained from: Patient Chief Complaint Right plantar foot wound Electronic Signature(s) Signed: 08/10/2022 3:09:38 PM By: Geralyn Corwin DO Entered By: Geralyn Corwin on 08/10/2022 09:16:32 -------------------------------------------------------------------------------- Debridement Details Patient Name: Date of Service: [name] 08/10/2022 8:15 A M Medical Record Number: [id number] Patient Account Number: [id number] Date of Birth/Sex: Treating RN: 14-Aug-1956 (65 y.o. [name]) Yevonne Pax Primary Care Provider: Manson Allan Other Clinician: Betha Loa Referring Provider: Treating Provider/Extender: Collins Scotland Weeks in Treatment: 39 Debridement Performed for Assessment: Wound #1 Right,Plantar Foot Performed By: Physician Geralyn Corwin, MD Debridement Type: Debridement Severity of Tissue Pre Debridement: Fat layer exposed Level of Consciousness (Pre-procedure): Awake and Alert Pre-procedure Verification/Time Out Yes - 08:40 Taken: Start Time: 08:40 T Area Debrided (L x W): otal 1 (cm) x 1 (cm) = 1 (cm) Tissue and other material debrided: Viable, Non-Viable, Callus, Slough, Subcutaneous, Slough Level: Skin/Subcutaneous Tissue Debridement Description: Excisional Instrument: Curette Bleeding: Minimum Hemostasis Achieved: Pressure Response to Treatment: Procedure was tolerated well Level of Consciousness (Post- Awake  and Alert procedure): [name], [name] ([id number]) [id number] Page 2 of 10 Post Debridement Measurements of Total Wound Length: (cm) 0.7 Width: (cm) 0.7 Depth: (cm) 0.3 Volume: (cm) 0.115 Character of Wound/Ulcer Post Debridement: Stable Severity of Tissue Post Debridement: Fat layer exposed Post Procedure Diagnosis Same as Pre-procedure Electronic Signature(s) Signed: 08/10/2022 3:09:38 PM By: Geralyn Corwin DO Signed: 08/12/2022 12:25:01 PM By: Yevonne Pax RN Signed: 08/18/2022 11:24:03 AM By: Betha Loa Entered By: Betha Loa on 08/10/2022 09:04:11 -------------------------------------------------------------------------------- HPI Details Patient Name: Date of Service: [name] 08/10/2022 8:15 A M Medical Record Number: [id number] Patient Account Number: [id number] Date of Birth/Sex: Treating RN: Jul 12, 1956 (65 y.o. [name] Primary Care Provider: Manson Allan Other Clinician: Betha Loa Referring Provider: Treating Provider/Extender: Collins Scotland Weeks in Treatment: 39 History of Present Illness HPI Description: Admission 11/10/2021 [name] is a 66 year old male with a past medical history of uncontrolled type 2 diabetes with last hemoglobin A1c of 8.1 complicated by peripheral neuropathy that presents to the clinic for a 2-year history of nonhealing ulcer to the bottom of his right foot. He states this started out as a blister caused by a work boot. He reports receiving wound care when he resided in [place]. He is reestablishing his wound care in [facility] today. He is currently keeping the area clean and covered. He has insoles designed to help offload the wound bed. He currently denies signs of infection. 1/18; patient presents for follow-up. He has been using Hydrofera Blue to the wound bed. He has no issues or complaints today. He has not received the defender.[name] He  denies signs of infection. 2/1; patient presents for follow-up. He has been using Hydrofera Blue to the wound bed he states he received the defender boot and has been using it however he did not have it on today. He currently denies signs of infection to the right foot. Unfortunately he developed a wound to the left great toe over the past week. He states he received new orthotics and these caused a blister to the left  great toe which turned into a wound. He has not been doing anything to the wound bed. He currently denies systemic signs of infection. 2/8; Patient presents for follow-up. 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 He states he is about 90% recovered. He has some mild weakness to the left side. He reports taking the antibiotics prescribed at last clinic visit. He reports using Hydrofera Blue for dressing changes. He only uses the offloading boot when he is at home. He uses open toed shoes to the right foot. He currently denies signs of infection. 2/15; patient presents for follow-up. He states he is still taking the antibiotics prescribed. He reports using the defender boot to the right foot and open toed shoe to the left foot however he is not wearing either today. He currently denies systemic signs of infection. He stated he cut down a tree last week and was outside doing yard work. 2/22; patient presents for follow-up. 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 He reports feeling well. He reports using the defender boot to the right leg and continues to use Hydrofera Blue with dressing changes. He denies systemic signs of infection. 3/8; patient presents for follow-up. He sees Dr. Alberteen Spindleline tomorrow for follow-up of the left great toe amputation. This was a partial amputation. He reports he is taking Augmentin currently. He reports increased  erythema to the left great toe amputation site. He also reports a decline in his right plantar foot wound. He reports it is draining more. He denies purulent drainage. 3/15; patient presents for follow-up. He did not see Dr. Alberteen Spindleline last week but states he sees him today. He states he has been taking doxycycline in the past week and started gentamicin ointment. [facility]. He currently denies systemic signs of infection. 3/22; patient presents for follow-up. Patient had partial amputation of the left great toe on 2/15. 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 Alberteen Spindleline. He reports no issues and has no complaints. He is currently taking Keflex and doxycycline prescribed by Dr. Alberteen Spindleline. He continues to use gentamicin ointment and Hydrofera Blue to the right foot wound. He reports using the defender boot when he is at home. 3/29; patient presents for follow-up. He continues to be on antibiotics per podiatry. He has been using Hydrofera Blue and gentamicin ointment to the right Scribner, Keltin ([id number]) [id number] Page 3 of 10 plantar foot wound. He has no issues or complaints today. He denies signs of infection. 4/5; patient presents for follow-up. He has been using Dakin's wet-to-dry dressings with improvement to wound healing. He denies signs of infection. She has no issues or complaints today. 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 Use Hydrofera Blue for a prolonged period of time and has been using Dakin's wet-to-dry I think for the last 2 to 3 weeks. He has a Psychologist, forensicdefender boot at home but he does not wear that coming into the clinic because he drives. He tells me he has been compliant with the defender boot short of when he goes out to drive. He lives alone. He also tells me he has had a previous left great toe amputation We are following him for the right foot wound. 4/26; patient  presents for follow-up. He had worsening of his left foot  wound and had surgical debridement on 4/16 by Dr. Gari Crown. He is being followed by podiatry for this issue. He he is on IV Unasyn based on tissue culture by ID.[name] We are following him for the right foot wound. He has been using Dakin's wet-to- dry dressings without issues. He reports continued usage of the defender boot. 5/3; patient presents for follow-up. He has been using Dakin's wet-to-dry dressings to the right foot wound. He has no issues or complaints today. He reports using the Conservation officer, nature. He reports offloading the foot wound when he is at home by sitting in a recliner. He denies signs of infection. He is still getting IV antibiotics For the left foot wound that is being followed by podiatry. He states the end date is later this month on 5/28. 5/17; patient presents for follow-up. He missed his last clinic appointment. He has been using Dakin's wet-to-dry dressings. He states he is offloading the right foot wound with the defender boot although he does not have this today. He is still on IV antibiotics. He has no issues or complaints today. 5/24; patient presents for follow-up. He has been using Hydrofera Blue to the wound bed without issues. He denies signs of infection. At last clinic visit he had a wound culture done that detected high levels of Enterococcus faecalis and low levels of Corynebacterium stratum and Staphylococcus epidermidis. Keystone antibiotics were ordered. He has not heard from the company yet. 5/31; patient presents for follow-up. He continues to use Hydrofera Blue to the wound bed. He denies signs of infection. Keystone antibiotics were ordered at last clinic visit and he states he called the company to pay for it. He states that it is being shipped. We rediscussed the total contact cast and he declines having this placed today. He states he is wearing the defender boot however he never wears it into the  office. 04-06-2022 upon evaluation today patient appears to be doing about the same in regard to his wound. This is not significantly smaller. He is not wearing his offloading boot. We discussed that today he tells me that he wears it "all the time as he chuckled and does not have it on during the office visit today. I think it is increasingly obvious that he is just messing around when he says this based on what I am seeing and otherwise I do not know why he would wear it all the time but not to the clinic. Either way my opinion based on what I am seeing currently is simply that the wound does seem to be still having quite a bit of callus buildup around the edges it is also very dry. He does tell me that he has been using the [facility] since he got it and to be honest I think this is good and should hopefully help keep things under control but at the same time I do not see any evidence of active infection. Again the [employer] is a topical antibiotic with multiple compounds to help prevent further infection and worsening overall. 6/14; patient presents for follow-up. He has been using Keystone antibiotics with [employer] daily. He states he is wearing his offloading boot. He does not have this in office today. He is going to the beach for the next 5 days. He states he Will not be using using any offloading device During this time. I again offered the total contact cast however he declined. I did ask him to reconsider for next clinic visit. He currently  denies signs of infection. 6/21; patient presents for follow-up. He went to the beach last week. He did not use an offloading device. Wound is slightly deeper today. I again offered the total contact cast but he declined. Currently denies signs of infection. He reports using the [place], hydrofera blue and the Psychologist, forensic. 6/28; patient presents for follow-up. Patient declines total contact cast today. He has been using Keystone and [facility] to  the wound bed. It is unclear if he is using the Psychologist, forensic. He would like a letter for the post man to delivers mail to his front door so he does not have to walk down the driveway. 7/5; patient presents for follow-up. He states he is considering the total contact cast however does not want this placed today. He is actually going to the gym after this appointment. He does not want offloading foot wear for this. He has been using Keystone and [facility] to the wound bed. He denies signs of infection. 7/12; patient presents for follow-up. He is still considering the total contact cast however does not want this today. He has been using Keystone antibiotic and Hydrofera Blue to the wound bed. He denies signs of infection. He has no issues or complaints today. 7/26; patient presents for follow-up. He declines a total contact cast. He has been using his an antibiotic and Hydrofera Blue to the wound bed. He denies systemic signs of infection. He is scheduled to discuss orthotics next week with his podiatrist. 8/2; patient presents for follow-up. He has been using Hydrofera Blue to the wound bed. He has no issues or complaints today. He declines a total contact cast. He states he is getting orthotics today. 8/9; patient presents for follow-up. He has been using Hydrofera Blue to the wound bed. He again declines the total contact cast. He does state that he is considering starting it at the end of the month. He has a vacation coming up and does not want to have it in place for that. 8/16; patient presents for follow-up. He has been using Hydrofera Blue and Keystone to the wound bed. He started taking Augmentin and doxycycline as prescribed. He denies signs of infection. He is going on his vacation next week and will be back in 2 weeks. He states he is considering the total contact cast for when he comes back from his trip. 8/30; patient present for follow-up. He has been using Hydrofera Blue and  Keystone to the wound bed. He continues to take doxycycline and Augmentin without issues. He went on vacation and reports walking for long periods of time without pressure relief to the wound bed. He currently denies systemic signs of infection. 9/6; patient presents for follow-up. He has been using Hydrofera Blue and Keystone to the wound bed. He is not wearing his offloading boot. He declines a total contact cast. He has finished Augmentin and doxycycline. He had an MRI completed on 07/01/2022 that did not show an underlying abscess, septic arthritis or osteomyelitis. He currently denies systemic signs of infection. 9/13; patient presents for follow-up. Has been using Hydrofera Blue and [facility]stone to the wound bed. He does not present with his offloading boot. He declines the total contact cast. He has been approved for PuraPly. We discussed this today and he would like this placed at next clinic visit. He denies signs of infection. 9/20; patient presents for follow-up. We have been using Hydrofera Blue and [facility]stone antibiotic ointment. Patient denies signs of infection. PuraPly is available  for placement and patient would like to proceed with this. 9/27; patient presents for follow-up. PuraPly #1 was placed in standard fashion to the right foot at last clinic visit. He tolerated this well. Unfortunately has developed a wound to the plantar surface of the left foot. He states he noticed this Sunday. He has pain to the site. He denies purulent drainage increased warmth or erythema. 10/4; patient presents for follow-up. PuraPly #2 was placed in standard fashion to the right foot at last clinic visit. He started taking Augmentin and doxycycline prescribed at last clinic visit due to findings of infection to the left foot. He has been doing Dakin's wet-to-dry to this area. He reports stability in his symptoms. He denies systemic signs of infection. 10/11; patient presents for follow-up. PuraPly #3 was  placed in standard fashion to the right foot at last clinic visit. Unfortunately the clinic did not order [name], [name] ([id number]) [id number] Page 4 of 10 another PuraPly for this week. He continues to take Augmentin and doxycycline. He still has pain in the left foot. He had an x-ray that did not show radiographic evidence of osteomyelitis. He still has serous drainage on exam. He is doing Dakin's wet-to-dry dressings to this area. Electronic Signature(s) Signed: 08/10/2022 3:09:38 PM By: Geralyn Corwin DO Entered By: Geralyn Corwin on 08/10/2022 09:17:27 -------------------------------------------------------------------------------- Physical Exam Details Patient Name: Date of Service: [name] 08/10/2022 8:15 A M Medical Record Number: [id number] Patient Account Number: [id number] Date of Birth/Sex: Treating RN: 1956/09/30 (65 y.o. [name]) Yevonne Pax Primary Care Provider: Manson Allan Other Clinician: Betha Loa Referring Provider: Treating Provider/Extender: Collins Scotland Weeks in Treatment: 34 Constitutional . Cardiovascular . Psychiatric . Notes Right foot: T the plantar aspect of the first metatarsal head there is an open wound with granulation tissue, Nonviable tissue and callus. There is undermining o to the 711 o'clock position. No signs of surrounding soft tissue infection. Left foot: T the Third submetatarsal there is an open wound that probes closely to o bone. No increased warmth, erythema or purulent drainage. Electronic Signature(s) Signed: 08/10/2022 3:09:38 PM By: Geralyn Corwin DO Entered By: Geralyn Corwin on 08/10/2022 09:18:28 -------------------------------------------------------------------------------- Physician Orders Details Patient Name: Date of Service: [name] 08/10/2022 8:15 A M Medical Record Number: [id number] Patient Account Number: [id number] Date of Birth/Sex:  Treating RN: Mar 08, 1956 (65 y.o. [name]) Yevonne Pax Primary Care Provider: Manson Allan Other Clinician: Betha Loa Referring Provider: Treating Provider/Extender: Collins Scotland Weeks in Treatment: 19 Verbal / Phone Orders: No Diagnosis Coding Ante, Casimiro Needle ([id number]) [id number] Page 5 of 10 ICD-10 Coding Code Description [phone] 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ed E11.621 Type 2 diabetes mellitus with foot ulcer E11.40 Type 2 diabetes mellitus with diabetic neuropathy, unspecified R26.89 Other abnormalities of gait and mobility S91.302A Unspecified open wound, left foot, initial encounter M79.672 Pain in left foot Wound Treatment Patient Medications llergies: No Known Allergies A Notifications Medication Indication Start End 08/10/2022 levofloxacin DOSE 1 - oral 750 mg tablet - 1 tablet oral Daily x 7 days Electronic Signature(s) Signed: 08/10/2022 3:09:38 PM By: Geralyn Corwin DO Previous Signature: 08/10/2022 9:10:32 AM Version By: Geralyn Corwin DO Entered By: Geralyn Corwin on 08/10/2022 09:23:09 -------------------------------------------------------------------------------- Problem List Details Patient Name: Date of Service: [name] 08/10/2022 8:15 A M Medical Record Number: [id number] Patient Account Number: [id number] Date of Birth/Sex: Treating RN: May 09, 1956 (65 y.o. [name] Primary Care Provider: Manson Allan Other Clinician: Betha Loa  Referring Provider: Treating Provider/Extender: Collins Scotland Weeks in Treatment: 39 Active Problems ICD-10 Encounter Code Description Active Date MDM Diagnosis L97.512 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ed 11/10/2021 No Yes E11.621 Type 2 diabetes mellitus with foot ulcer 11/10/2021 No Yes E11.40 Type 2 diabetes mellitus with diabetic neuropathy,  unspecified 11/10/2021 No Yes R26.89 Other abnormalities of gait and mobility 11/10/2021 No Yes S91.302A Unspecified open wound, left foot, initial encounter 07/27/2022 No Yes [name], [name] ([id number]) [id number] Page 6 of 10 [phone] Pain in left foot 08/10/2022 No Yes Inactive Problems ICD-10 Code Description Active Date Inactive Date L97.528 Non-pressure chronic ulcer of other part of left foot with other specified severity 12/01/2021 12/01/2021 Resolved Problems Electronic Signature(s) Signed: 08/10/2022 3:09:38 PM By: Geralyn Corwin DO Entered By: Geralyn Corwin on 08/10/2022 09:16:28 -------------------------------------------------------------------------------- Progress Note Details Patient Name: Date of Service: [name] 08/10/2022 8:15 A M Medical Record Number: [id number] Patient Account Number: [id number] Date of Birth/Sex: Treating RN: Jul 25, 1956 (65 y.o. [name]) Yevonne Pax Primary Care Provider: Manson Allan Other Clinician: Betha Loa Referring Provider: Treating Provider/Extender: Collins Scotland Weeks in Treatment: 39 Subjective Chief Complaint Information obtained from Patient Right plantar foot wound History of Present Illness (HPI) Admission 11/10/2021 Mr. Bubba Vanbenschoten is a 66 year old male with a past medical history of uncontrolled type 2 diabetes with last hemoglobin A1c of 8.1 complicated by peripheral neuropathy that presents to the clinic for a 2-year history of nonhealing ulcer to the bottom of his right foot. He states this started out as a blister caused by a work boot. He reports receiving wound care when he resided in [place]. He is reestablishing his wound care in [facility] today. He is currently keeping the area clean and covered. He has insoles designed to help offload the wound bed. He currently denies signs of infection. 1/18; patient presents for follow-up. He has been using  Hydrofera Blue to the wound bed. He has no issues or complaints today. He has not received the defender.[name] He denies signs of infection. 2/1; patient presents for follow-up. He has been using Hydrofera Blue to the wound bed he states he received the defender boot and has been using it however he did not have it on today. He currently denies signs of infection to the right foot. Unfortunately he developed a wound to the left great toe over the past week. He states he received new orthotics and these caused a blister to the left great toe which turned into a wound. He has not been doing anything to the wound bed. He currently denies systemic signs of infection. 2/8; Patient presents for follow-up. 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 He states he is about 90% recovered. He has some mild weakness to the left side. He reports taking the antibiotics prescribed at last clinic visit. He reports using Hydrofera Blue for dressing changes. He only uses the offloading boot when he is at home. He uses open toed shoes to the right foot. He currently denies signs of infection. 2/15; patient presents for follow-up. He states he is still taking the antibiotics prescribed. He reports using the defender boot to the right foot and open toed shoe to the left foot however he is not wearing either today. He currently denies systemic signs of infection. He stated he cut down a  tree last week and was outside doing yard work. 2/22; patient presents for follow-up. 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 He reports feeling well. He reports using the defender boot to the right leg and continues to use Hydrofera Blue with dressing changes. He denies systemic signs of infection. 3/8; patient presents for follow-up. He sees Dr. Alberteen Spindle tomorrow for follow-up of the left great toe  amputation. This was a partial amputation. He reports he is taking Augmentin currently. He reports increased erythema to the left great toe amputation site. He also reports a decline in his right plantar foot wound. He reports it is draining more. He denies purulent drainage. MARTYN, TIMME ([id number]) [id number] Page 7 of 10 3/15; patient presents for follow-up. He did not see Dr. Alberteen Spindle last week but states he sees him today. He states he has been taking doxycycline in the past week and started gentamicin ointment. He continues to use [facility] with dressing changes. He currently denies systemic signs of infection. 3/22; patient presents for follow-up. Patient had partial amputation of the left great toe on 2/15. 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 Alberteen Spindle. He reports no issues and has no complaints. He is currently taking Keflex and doxycycline prescribed by Dr. Alberteen Spindle. He continues to use gentamicin ointment and Hydrofera Blue to the right foot wound. He reports using the defender boot when he is at home. 3/29; patient presents for follow-up. He continues to be on antibiotics per podiatry. He has been using Hydrofera Blue and gentamicin ointment to the right plantar foot wound. He has no issues or complaints today. He denies signs of infection. 4/5; patient presents for follow-up. He has been using Dakin's wet-to-dry dressings with improvement to wound healing. He denies signs of infection. She has no issues or complaints today. 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 Use Hydrofera Blue for a prolonged period of time and has been using Dakin's wet-to-dry I think for the last 2 to 3 weeks. He has a Psychologist, forensic at home but he does not wear that coming into the clinic because he drives. He tells me he has been compliant with the defender boot short of when he goes out to drive. He lives alone. He also tells me  he has had a previous left great toe amputation We are following him for the right foot wound. 4/26; patient presents for follow-up. He had worsening of his left foot wound and had surgical debridement on 4/16 by Dr. Fanny Skates. He is being followed by podiatry for this issue. He he is on IV Unasyn based on tissue culture by ID.[name] We are following him for the right foot wound. He has been using Dakin's wet-to- dry dressings without issues. He reports continued usage of the defender boot. 5/3; patient presents for follow-up. He has been using Dakin's wet-to-dry dressings to the right foot wound. He has no issues or complaints today. He reports using the Psychologist, forensic. He reports offloading the foot wound when he is at home by sitting in a recliner. He denies signs of infection. He is still getting IV antibiotics For the left foot wound that is being followed by podiatry. He states the end date is later this month on 5/28. 5/17; patient presents for follow-up. He missed his last clinic appointment. He has been using Dakin's wet-to-dry dressings. He states he is offloading the right foot wound with the defender boot although he  does not have this today. He is still on IV antibiotics. He has no issues or complaints today. 5/24; patient presents for follow-up. He has been using Hydrofera Blue to the wound bed without issues. He denies signs of infection. At last clinic visit he had a wound culture done that detected high levels of Enterococcus faecalis and low levels of Corynebacterium stratum and Staphylococcus epidermidis. [facility]stone antibiotics were ordered. He has not heard from the company yet. 5/31; patient presents for follow-up. He continues to use Hydrofera Blue to the wound bed. He denies signs of infection. Keystone antibiotics were ordered at last clinic visit and he states he called the company to pay for it. He states that it is being shipped. We rediscussed the total contact cast and he  declines having this placed today. He states he is wearing the defender boot however he never wears it into the office. 04-06-2022 upon evaluation today patient appears to be doing about the same in regard to his wound. This is not significantly smaller. He is not wearing his offloading boot. We discussed that today he tells me that he wears it "all the time as he chuckled and does not have it on during the office visit today. I think it is increasingly obvious that he is just messing around when he says this based on what I am seeing and otherwise I do not know why he would wear it all the time but not to the clinic. Either way my opinion based on what I am seeing currently is simply that the wound does seem to be still having quite a bit of callus buildup around the edges it is also very dry. He does tell me that he has been using the [facility] since he got it and to be honest I think this is good and should hopefully help keep things under control but at the same time I do not see any evidence of active infection. Again the Jodie Echevaria is a topical antibiotic with multiple compounds to help prevent further infection and worsening overall. 6/14; patient presents for follow-up. He has been using Keystone antibiotics with [employer]	[place] daily. He states he is wearing his offloading boot. He does not have this in office today. He is going to the beach for the next 5 days. He states he Will not be using using any offloading device During this time. I again offered the total contact cast however he declined. I did ask him to reconsider for next clinic visit. He currently denies signs of infection. 6/21; patient presents for follow-up. He went to the beach last week. He did not use an offloading device. Wound is slightly deeper today. I again offered the total contact cast but he declined. Currently denies signs of infection. He reports using the [place], hydrofera blue and the Psychologist, forensic. 6/28; patient  presents for follow-up. Patient declines total contact cast today. He has been using Keystone and [facility] to the wound bed. It is unclear if he is using the Psychologist, forensic. He would like a letter for the post man to delivers mail to his front door so he does not have to walk down the driveway. 7/5; patient presents for follow-up. He states he is considering the total contact cast however does not want this placed today. He is actually going to the gym after this appointment. He does not want offloading foot wear for this. He has been using Keystone and [facility] to the wound bed. He denies signs of  infection. 7/12; patient presents for follow-up. He is still considering the total contact cast however does not want this today. He has been using Keystone antibiotic and Hydrofera Blue to the wound bed. He denies signs of infection. He has no issues or complaints today. 7/26; patient presents for follow-up. He declines a total contact cast. He has been using his an antibiotic and Hydrofera Blue to the wound bed. He denies systemic signs of infection. He is scheduled to discuss orthotics next week with his podiatrist. 8/2; patient presents for follow-up. He has been using Hydrofera Blue to the wound bed. He has no issues or complaints today. He declines a total contact cast. He states he is getting orthotics today. 8/9; patient presents for follow-up. He has been using Hydrofera Blue to the wound bed. He again declines the total contact cast. He does state that he is considering starting it at the end of the month. He has a vacation coming up and does not want to have it in place for that. 8/16; patient presents for follow-up. He has been using Hydrofera Blue and Keystone to the wound bed. He started taking Augmentin and doxycycline as prescribed. He denies signs of infection. He is going on his vacation next week and will be back in 2 weeks. He states he is considering the total contact  cast for when he comes back from his trip. 8/30; patient present for follow-up. He has been using Hydrofera Blue and Keystone to the wound bed. He continues to take doxycycline and Augmentin without issues. He went on vacation and reports walking for long periods of time without pressure relief to the wound bed. He currently denies systemic signs of infection. 9/6; patient presents for follow-up. He has been using Hydrofera Blue and Keystone to the wound bed. He is not wearing his offloading boot. He declines a total contact cast. He has finished Augmentin and doxycycline. He had an MRI completed on 07/01/2022 that did not show an underlying abscess, septic arthritis or osteomyelitis. He currently denies systemic signs of infection. 9/13; patient presents for follow-up. Has been using Hydrofera Blue and Keystone to the wound bed. He does not present with his offloading boot. He declines the total contact cast. He has been approved for PuraPly. We discussed this today and he would like this placed at next clinic visit. He denies signs of infection. 9/20; patient presents for follow-up. We have been using Hydrofera Blue and Keystone antibiotic ointment. Patient denies signs of infection. PuraPly is available for placement and patient would like to proceed with this. 9/27; patient presents for follow-up. PuraPly #1 was placed in standard fashion to the right foot at last clinic visit. He tolerated this well. Unfortunately has [name], [name] ([id number]) [id number] Page 8 of 10 developed a wound to the plantar surface of the left foot. He states he noticed this Sunday. He has pain to the site. He denies purulent drainage increased warmth or erythema. 10/4; patient presents for follow-up. PuraPly #2 was placed in standard fashion to the right foot at last clinic visit. He started taking Augmentin and doxycycline prescribed at last clinic visit due to findings of infection to  the left foot. He has been doing Dakin's wet-to-dry to this area. He reports stability in his symptoms. He denies systemic signs of infection. 10/11; patient presents for follow-up. PuraPly #3 was placed in standard fashion to the right foot at last clinic visit. Unfortunately the clinic did not order another PuraPly for this week. He continues  to take Augmentin and doxycycline. He still has pain in the left foot. He had an x-ray that did not show radiographic evidence of osteomyelitis. He still has serous drainage on exam. He is doing Dakin's wet-to-dry dressings to this area. Objective Constitutional Vitals Time Taken: 8:25 AM, Height: 74 in, Weight: 226 lbs, BMI: 29, Temperature: 97.9 F, Pulse: 79 bpm, Respiratory Rate: 18 breaths/min, Blood Pressure: 124/74 mmHg. General Notes: Right foot: T the plantar aspect of the first metatarsal head there is an open wound with granulation tissue, Nonviable tissue and callus. There o is undermining to the 7oo11 o'clock position. No signs of surrounding soft tissue infection. Left foot: T the Third submetatarsal there is an open wound that o probes closely to bone. No increased warmth, erythema or purulent drainage. Integumentary (Hair, Skin) Wound #1 status is Open. Original cause of wound was Blister. The date acquired was: 07/15/2020. The wound has been in treatment 39 weeks. The wound is located on the Right,Plantar Foot. The wound measures 0.7cm length x 0.7cm width x 0.2cm depth; 0.385cm^2 area and 0.077cm^3 volume. There is Fat Layer (Subcutaneous Tissue) exposed. There is a medium amount of serosanguineous drainage noted. The wound margin is thickened. There is large (67-100%) red granulation within the wound bed. There is a small (1-33%) amount of necrotic tissue within the wound bed including Adherent Slough. Wound #3 status is Open. Original cause of wound was Pressure Injury. The date acquired was: 07/24/2022. The wound has been in treatment 2  weeks. The wound is located on the Left,Plantar Foot. The wound measures 1.2cm length x 0.8cm width x 1.4cm depth; 0.754cm^2 area and 1.056cm^3 volume. There is Fat Layer (Subcutaneous Tissue) exposed. There is a medium amount of serosanguineous drainage noted. The wound margin is flat and intact. There is no granulation within the wound bed. There is a small (1-33%) amount of necrotic tissue within the wound bed. 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 I recommended stopping Augmentin and doxycycline Since I have noticed minimal improvement and start levofloxacin. We obtained an x-ray that did not show radiographic evidence of osteomyelitis. Since he still has pain and this probes closely to bone I recommended getting an MRI. I recommended continuing Dakin's wet-to-dry dressings to the left foot. T the right foot we have been o using PuraPly however this is not available today. I debrided nonviable tissue and recommended Keystone antibiotic and Hydrofera Blue. Continue aggressive offloading. Follow-up in 1 week. Procedures Wound #1 Pre-procedure diagnosis of Wound #1 is a Diabetic Wound/Ulcer of the Lower Extremity located on the Right,Plantar Foot .Severity of Tissue Pre Debridement is: Fat layer exposed. There was a Excisional Skin/Subcutaneous Tissue Debridement with a total area of 1 sq cm performed by Geralyn Corwin, MD. With the following instrument(s): Curette to remove Viable and Non-Viable tissue/material. Material removed includes Callus, Subcutaneous Tissue, and Slough. A time out was conducted at 08:40, prior to  the start of the procedure. A Minimum amount of bleeding was controlled with Pressure. The procedure was tolerated well. Post Debridement Measurements: 0.7cm length x 0.7cm width x 0.3cm depth; 0.115cm^3 volume. Character of Wound/Ulcer Post Debridement is stable. Severity of Tissue Post Debridement  is: Fat layer exposed. Post procedure Diagnosis Wound #1: Same as Pre-Procedure Plan The following medication(s) was prescribed: levofloxacin oral 750 mg tablet 1 1 tablet oral Daily x 7 days starting 08/10/2022 [name] ([id number]) [id number] Page 9 of 10 1. In office sharp debridement 2. Hydrofera Blue and Keystone antibioticooright foot 3. MRI of the left foot 4. Dakin's wet-to-dry to the left foot 5. Aggressive offloading 6. Levofloxacin 7. Follow-up in 1 week Electronic Signature(s) Signed: 08/10/2022 3:09:38 PM By: Geralyn Corwin DO Entered By: Geralyn Corwin on 08/10/2022 09:22:30 -------------------------------------------------------------------------------- SuperBill Details Patient Name: Date of Service: [name] 08/10/2022 Medical Record Number: [id number] Patient Account Number: [id number] Date of Birth/Sex: Treating RN: [id number] (65 y.o. [name]) Yevonne Pax Primary Care Provider: Manson Allan Other Clinician: Betha Loa Referring Provider: Treating Provider/Extender: Collins Scotland Weeks in Treatment: 39 Diagnosis Coding ICD-10 Codes Code Description [phone] 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ed E11.621 Type 2 diabetes mellitus with foot ulcer E11.40 Type 2 diabetes mellitus with diabetic neuropathy, unspecified R26.89 Other abnormalities of gait and mobility S91.302A Unspecified open wound, left foot, initial encounter M79.672 Pain in left foot Facility Procedures : CPT4 Code: [id number] Description: 11042 - DEB SUBQ TISSUE 20 SQ CM/< ICD-10 Diagnosis  Description L97.512 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ed E11.621 Type 2 diabetes mellitus with foot ulcer Modifier: Quantity: 1 Physician Procedures : CPT4 Code Description Modifier [id number] - WC PHYS LEVEL 4 - EST PT ICD-10 Diagnosis Description M79.672 Pain in left foot E11.621 Type 2 diabetes mellitus with foot ulcer S91.302A Unspecified open wound, left foot, initial encounter Quantity: 1 Electronic Signature(s) Signed: 08/10/2022 3:09:38 PM By: Geralyn Corwin DO Entered By: Geralyn Corwin on 08/10/2022 09:23:00

## 2022-08-18 NOTE — Anesthesia Preprocedure Evaluation (Signed)
Anesthesia Evaluation  Patient identified by MRN, date of birth, ID band Patient awake    Reviewed: Allergy & Precautions, NPO status , Patient's Chart, lab work & pertinent test results, reviewed documented beta blocker date and time , Unable to perform ROS - Chart review only  History of Anesthesia Complications Negative for: history of anesthetic complications  Airway Mallampati: II  TM Distance: >3 FB Neck ROM: full    Dental no notable dental hx. (+) Teeth Intact   Pulmonary neg pulmonary ROS, neg sleep apnea, neg COPD, Patient abstained from smoking.Not current smoker, former smoker,    Pulmonary exam normal breath sounds clear to auscultation       Cardiovascular Exercise Tolerance: Good METShypertension, +CHF  (-) CAD and (-) Past MI (-) dysrhythmias  Rhythm:regular Rate:Normal  1. Left ventricular ejection fraction, by estimation, is 50 to 55%. The  left ventricle has low normal function. Left ventricular endocardial  border not optimally defined to evaluate regional wall motion. There is  moderate left ventricular hypertrophy.  Left ventricular diastolic parameters are consistent with Grade II  diastolic dysfunction (pseudonormalization).  2. Right ventricular systolic function is normal. The right ventricular  size is normal.  3. The mitral valve is abnormal. Trivial mitral valve regurgitation. No  evidence of mitral stenosis.  4. The aortic valve was not well visualized. There is mild thickening of  the aortic valve. Aortic valve regurgitation is not visualized. Aortic  valve sclerosis is present, with no evidence of aortic valve stenosis.    Neuro/Psych PSYCHIATRIC DISORDERS Depression negative neurological ROS     GI/Hepatic negative GI ROS, Neg liver ROS, neg GERD  ,  Endo/Other  diabetes, Well Controlled  Renal/GU Renal diseasenegative Renal ROS     Musculoskeletal   Abdominal   Peds   Hematology negative hematology ROS (+)   Anesthesia Other Findings Past Medical History: No date: Diabetes mellitus without complication ([facility]) No date: Gout  Reproductive/Obstetrics negative OB ROS                             Anesthesia Physical  Anesthesia Plan  ASA: 2  Anesthesia Plan: General   Post-op Pain Management: Ofirmev IV (intra-op)*   Induction: Intravenous  PONV Risk Score and Plan: 2 and Ondansetron, Dexamethasone and Midazolam  Airway Management Planned: LMA  Additional Equipment: None  Intra-op Plan:   Post-operative Plan: Extubation in OR  Informed Consent: I have reviewed the patients History and Physical, chart, labs and discussed the procedure including the risks, benefits and alternatives for the proposed anesthesia with the patient or authorized representative who has indicated his/her understanding and acceptance.     Dental advisory given  Plan Discussed with: CRNA  Anesthesia Plan Comments: (On daily semaglutide pills, not taken today. Asymptomatic from GI standpoint. Discussed risks of anesthesia with patient, including PONV, sore throat, lip/dental/eye damage. Rare risks discussed as well, such as cardiorespiratory and neurological sequelae, and allergic reactions. Discussed the role of CRNA in patient's perioperative care. Patient understands.)        Anesthesia Quick Evaluation

## 2022-08-18 NOTE — Op Note (Signed)
Surgeon: Surgeon(s): Felipa Furnace, DPM  Assistants: Same Pre-operative diagnosis: Diabetic Foot Infection  Post-operative diagnosis: same Procedure: 1. Left transmetatarsal amputation 2. Left Achilles tendon lengthening  Pathology: * No specimens in log *  Pertinent Intra-op findings: None Anesthesia: Choice  Hemostasis:  Total Tourniquet Time Documented: Calf (Left) - 29 minutes Total: Calf (Left) - 29 minutes  EBL 100 cc  Materials: 3-0 prolene and skin staples Injectables: None Complications: None  Indications for surgery: A 66 y.o. male presents with left foot osteomyelitis. Patient has failed all conservative therapy including but not limited to local wound care and IV antibiotics. He wishes to have surgical correction of the foot/deformity. It was determined that patient would benefit from left transmetatarsal amputation given that patient has lost the integrity of the partial first ray and now the second.  Patient will also benefit from Achilles tendon lengthening for prevention of future ulceration . Informed surgical risk consent was reviewed and read aloud to the patient.  I reviewed the films.  I have discussed my findings with the patient in great detail.  I have discussed all risks including but not limited to infection, stiffness, scarring, limp, disability, deformity, damage to blood vessels and nerves, numbness, poor healing, need for braces, arthritis, chronic pain, amputation, death.  All benefits and realistic expectations discussed in great detail.  I have made no promises as to the outcome.  I have provided realistic expectations.  I have offered the patient a 2nd opinion, which they have declined and assured me they preferred to proceed despite the risks   Procedure in detail: The patient was both verbally and visually identified by myself, the nursing staff, and anesthesia staff in the preoperative holding area. They were then transferred to the operating room and  placed on the operative table in supine position.  After being properly identified the patient was taken to the operating room and after an adequate level of general anesthesia was obtained, the patient was positioned on the operating room table in the prone position.  At this point, the operative lower extremity was prepped and draped in the usual sterile fashion.  The leg was elevated but not exsanguinated and tourniquet inflated to 250 mmHg.   We subsequently made a distal stab wound in the distal aspect of the Achilles and turned the knife 90 degrees and made a medial stepcut.  This was repeated in the central portion of the tendon with a lateral stepcut and then again more proximally with another medial subcut.  At this point, pressure was applied to the foot to bring the foot up into a neutral position, thus lengthening the Achilles.  At this point we felt we had adequate position.  Our wounds were closed with 3-0 Prolene sutures.  A sterile dressing was applied followed by a well-padded cam boot the patient was returned to the supine position, awakened and authorized for transport to the post anesthesia recovery room having tolerated the procedure well.   A fish mouth incision was made circumfrenting the forefoot and carried down to the metatarsal shafts using a #10 surgical blade.  The metatarsal shafts were freed from their periosteal tissue.  A sagittal bone saw was utilized for all the metatarsal osteotomies near their bases at the proximal 1/3. The distal part of the forefoot was then surgically removed and passed from the operative field and sent to pathology for biopsy and to microbiology for gram stain and anaerobic and aerobic culture and sensitivity.    The  more proximal remaining metatarsal bones were check to have viable, hard and good bleeding, bone.  A piece of the more proximal bone was collected and sent to microbiology for gram stain and anaerobic and aerobic culture and sensitivity.   The remaining bone was smoothed from its sharp edges with a bone rasp.   The amputation site was copiously irrigated with pulse lavage sterile saline (total 4 liters).    The subcutaneous tissues were then re-approximated utilizing 3-0 Vicryl. The skin was then re-approximated utilizing 2-0 nylon using a simple running interlock suture technique.   Upon completion of the above procedure, the surgical site was dressed with the rest ofThe surgical site was covered with compressive dressing consisting of 4 x 4 gauze, Kerlix bandages.   At the conclusion of the procedure the patient was awoken from anesthesia and found to have tolerated the procedure well any complications. There were transferred to PACU with vital signs stable and vascular status intact.  Boneta Lucks, DPM

## 2022-08-18 NOTE — Progress Notes (Signed)
MATHEU, PLOEGER ([id number]) [id number] Page 1 of 10 Visit Report for 08/17/2022 Arrival Information Details Patient Name: Date of Service: [name], [name] 08/17/2022 8:15 A M Medical Record Number: [id number] Patient Account Number: [id number] Date of Birth/Sex: Treating RN: 11/03/1955 (66 y.o. [name] Primary Care [name]: [name] Other Clinician: Massie Kluver Referring [name]: Treating [name]/Extender: [name] Weeks in Treatment: 60 Visit Information History Since Last Visit All ordered tests and consults were completed: No Patient Arrived: Wheel Chair Added or deleted any medications: No Arrival Time: 08:10 Any new allergies or adverse reactions: No Transfer Assistance: None Had a fall or experienced change in No Patient Requires Transmission-Based Precautions: No activities of daily living that may affect Patient Has Alerts: Yes risk of falls: Patient Alerts: Patient on Blood Thinner Hospitalized since last visit: No DIABETIC Pain Present Now: Yes Plavix Electronic Signature(s) Signed: 08/18/2022 11:23:10 AM By: Massie Kluver Entered By: Massie Kluver on 08/17/2022 08:12:06 -------------------------------------------------------------------------------- Clinic Level of Care Assessment Details Patient Name: Date of Service: [name], [name] 08/17/2022 8:15 A M Medical Record Number: [id number] Patient Account Number: [id number] Date of Birth/Sex: Treating RN: [birth year] (66 y.o. [name] Primary Care [name]: [name] Other Clinician: Massie Kluver Referring Teighan Aubert: Treating Savir Blanke/Extender: [name] Weeks in Treatment: 40 Clinic Level of Care Assessment Items TOOL 1 Quantity Score []  - 0 Use when EandM and Procedure is performed on INITIAL visit ASSESSMENTS - Nursing Assessment / Reassessment []  - 0 General Physical Exam (combine w/ comprehensive  assessment (listed just below) when performed on new pt. evals) []  - 0 Comprehensive Assessment (HX, ROS, Risk Assessments, Wounds Hx, etc.) ASSESSMENTS - Wound and Skin Assessment / Reassessment []  - 0 Dermatologic / Skin Assessment (not related to wound area) ASSESSMENTS - Ostomy and/or Continence Assessment and Care [name], [name] ([id number]) [id number] Page 2 of 10 []  - 0 Incontinence Assessment and Management []  - 0 Ostomy Care Assessment and Management (repouching, etc.) PROCESS - Coordination of Care []  - 0 Simple Patient / Family Education for ongoing care []  - 0 Complex (extensive) Patient / Family Education for ongoing care []  - 0 Staff obtains Programmer, systems, Records, T Results / Process Orders est []  - 0 Staff telephones HHA, Nursing Homes / Clarify orders / etc []  - 0 Routine Transfer to another Facility (non-emergent condition) []  - 0 Routine Hospital Admission (non-emergent condition) []  - 0 New Admissions / Biomedical engineer / Ordering NPWT Apligraf, etc. , []  - 0 Emergency Hospital Admission (emergent condition) PROCESS - Special Needs []  - 0 Pediatric / Minor Patient Management []  - 0 Isolation Patient Management []  - 0 Hearing / Language / Visual special needs []  - 0 Assessment of Community assistance (transportation, D/C planning, etc.) []  - 0 Additional assistance / Altered mentation []  - 0 Support Surface(s) Assessment (bed, cushion, seat, etc.) INTERVENTIONS - Miscellaneous []  - 0 External ear exam []  - 0 Patient Transfer (multiple staff / Civil Service fast streamer / Similar devices) []  - 0 Simple Staple / Suture removal (25 or less) []  - 0 Complex Staple / Suture removal (26 or more) []  - 0 Hypo/Hyperglycemic Management (do not check if billed separately) []  - 0 Ankle / Brachial Index (ABI) - do not check if billed separately Has the patient been seen at the hospital within the last three years: Yes Total Score: 0 Level  Of Care: ____ Electronic Signature(s) Signed: 08/18/2022 11:23:10 AM By: Massie Kluver Entered By: Massie Kluver on 08/17/2022 08:57:06 -------------------------------------------------------------------------------- Encounter Discharge Information  Details Patient Name: Date of Service: [name], [name] 08/17/2022 8:15 A M Medical Record Number: [id number] Patient Account Number: [id number] Date of Birth/Sex: Treating RN: Nov 01, 1955 (66 y.o. [name] Primary Care [name]: [name] Other Clinician: Massie Kluver Referring Fiore Detjen: Treating Zoye Chandra/Extender: [name] Weeks in Treatment: 81 Encounter Discharge Information Items Post Procedure Vitals Discharge Condition: Stable Temperature (F): 98.2 [name], [name] ([id number]) [phone] Page 3 of 10 Ambulatory Status: Wheelchair Pulse (bpm): 97 Discharge Destination: Emergency Room Respiratory Rate (breaths/min): 18 Telephoned: No Blood Pressure (mmHg): 116/77 Orders Sent: Yes Transportation: Private Auto Accompanied By: self Schedule Follow-up Appointment: Yes Clinical Summary of Care: Electronic Signature(s) Signed: 08/18/2022 11:23:10 AM By: Massie Kluver Entered By: Massie Kluver on 08/17/2022 09:09:12 -------------------------------------------------------------------------------- Lower Extremity Assessment Details Patient Name: Date of Service: [name], [name] 08/17/2022 8:15 A M Medical Record Number: [id number] Patient Account Number: [id number] Date of Birth/Sex: Treating RN: 02/06/1956 (66 y.o. [name] Primary Care Ciro Tashiro: [name] Other Clinician: Massie Kluver Referring Patrisia Faeth: Treating Jeraldine Primeau/Extender: [name] Weeks in Treatment: 40 Electronic Signature(s) Signed: 08/17/2022 3:41:25 PM By: Gretta Cool BSN, RN, CWS, Kim RN, BSN Signed: 08/18/2022 11:23:10 AM By: Massie Kluver Entered By: Massie Kluver on  08/17/2022 08:24:40 -------------------------------------------------------------------------------- Multi Wound Chart Details Patient Name: Date of Service: [name] 08/17/2022 8:15 A M Medical Record Number: [id number] Patient Account Number: [id number] Date of Birth/Sex: Treating RN: 10-Jun-1956 (66 y.o. [name] Primary Care Ameilia Rattan: [name] Other Clinician: Massie Kluver Referring Korra Christine: Treating Vin Yonke/Extender: [name] Weeks in Treatment: 40 Vital Signs Height(in): 74 Pulse(bpm): 97 Weight(lbs): 226 Blood Pressure(mmHg): 116/77 Body Mass Index(BMI): 29 Temperature(F): 98.2 Respiratory Rate(breaths/min): 18 [1:Photos:] [N/A:N/A [id number] Page 4 of 10] Right, Plantar Foot Left, Plantar Foot N/A Wound Location: Blister Pressure Injury N/A Wounding Event: Diabetic Wound/Ulcer of the Lower Diabetic Wound/Ulcer of the Lower N/A Primary Etiology: Extremity Extremity Type II Diabetes, Osteoarthritis Type II Diabetes, Osteoarthritis N/A Comorbid History: 07/15/2020 07/24/2022 N/A Date Acquired: 40 3 N/A Weeks of Treatment: Open Open N/A Wound Status: No No N/A Wound Recurrence: 0.9x0.7x0.3 1.2x0.9x2.1 N/A Measurements L x W x D (cm) 0.495 0.848 N/A A (cm) : rea 0.148 1.781 N/A Volume (cm) : 55.90% -19.90% N/A % Reduction in A rea: 78.00% 31.90% N/A % Reduction in Volume: Grade 2 Grade 3 N/A Classification: Medium Medium N/A Exudate A mount: Serosanguineous Serosanguineous N/A Exudate Type: red, brown red, brown N/A Exudate Color: Thickened Flat and Intact N/A Wound Margin: Large (67-100%) None Present (0%) N/A Granulation A mount: Red N/A N/A Granulation Quality: Small (1-33%) Small (1-33%) N/A Necrotic A mount: Fat Layer (Subcutaneous Tissue): Yes Fat Layer (Subcutaneous Tissue): Yes N/A Exposed Structures: Fascia: No Fascia: No Tendon: No Tendon: No Muscle:  No Muscle: No Joint: No Joint: No Bone: No Bone: No None None N/A Epithelialization: Treatment Notes Electronic Signature(s) Signed: 08/18/2022 11:23:10 AM By: Massie Kluver Entered By: Massie Kluver on 08/17/2022 08:24:50 -------------------------------------------------------------------------------- Multi-Disciplinary Care Plan Details Patient Name: Date of Service: [name] 08/17/2022 8:15 A M Medical Record Number: [id number] Patient Account Number: [id number] Date of Birth/Sex: Treating RN: 1956-02-05 (66 y.o. [name] Primary Care Connelly [place]: [name] Other Clinician: Massie Kluver Referring Harmonee Tozer: Treating Carma Dwiggins/Extender: [name] Weeks in Treatment: 40 Active Inactive Wound/Skin Impairment Nursing Diagnoses: Impaired tissue integrity Knowledge deficit related to smoking impact on wound healing Knowledge deficit related to ulceration/compromised skin integrity Mondor, [name] ([id number]) [phone].pdf Page 5 of 10 Goals: Patient/caregiver will verbalize  understanding of skin care regimen Date Initiated: 11/10/2021 Date Inactivated: 12/01/2021 Target Resolution Date: 11/17/2021 Goal Status: Met Ulcer/skin breakdown will have a volume reduction of 30% by week 4 Date Initiated: 11/10/2021 Date Inactivated: 02/02/2022 Target Resolution Date: 12/08/2021 Goal Status: Met Ulcer/skin breakdown will have a volume reduction of 50% by week 8 Date Initiated: 11/10/2021 Target Resolution Date: 01/05/2022 Goal Status: Active Ulcer/skin breakdown will have a volume reduction of 80% by week 12 Date Initiated: 11/10/2021 Target Resolution Date: 02/02/2022 Goal Status: Active Ulcer/skin breakdown will heal within 14 weeks Date Initiated: 11/10/2021 Target Resolution Date: 02/16/2022 Goal Status: Active Interventions: Assess patient/caregiver ability to obtain necessary supplies Assess patient/caregiver ability to  perform ulcer/skin care regimen upon admission and as needed Assess ulceration(s) every visit Notes: Electronic Signature(s) Signed: 08/17/2022 3:41:25 PM By: Gretta Cool, BSN, RN, CWS, Kim RN, BSN Signed: 08/18/2022 11:23:10 AM By: Massie Kluver Entered By: Massie Kluver on 08/17/2022 08:24:44 -------------------------------------------------------------------------------- Pain Assessment Details Patient Name: Date of Service: [name] 08/17/2022 8:15 A M Medical Record Number: [id number] Patient Account Number: [id number] Date of Birth/Sex: Treating RN: 1956/09/01 (66 y.o. [name] Primary Care Sharea Guinther: [name] Other Clinician: Massie Kluver Referring Shauntay Brunelli: Treating Cain Fitzhenry/Extender: [name] Weeks in Treatment: 40 Active Problems Location of Pain Severity and Description of Pain Patient Has Paino Yes Site Locations Pain Location: Pain in Ulcers Duration of the Pain. Constant / Intermittento Constant Rate the pain. Current Pain Level: 6 Kock, Lambros ([id number]) [id number].pdf Page 6 of 10 Character of Pain Describe the Pain: Sharp, Throbbing Pain Management and Medication Current Pain Management: Medication: Yes Rest: Yes Electronic Signature(s) Signed: 08/17/2022 3:41:25 PM By: Gretta Cool, BSN, RN, CWS, Kim RN, BSN Signed: 08/18/2022 11:23:10 AM By: Massie Kluver Entered By: Massie Kluver on 08/17/2022 08:16:11 -------------------------------------------------------------------------------- Patient/Caregiver Education Details Patient Name: Date of Service: [name] 10/18/2023andnbsp8:15 A M Medical Record Number: [id number] Patient Account Number: [id number] Date of Birth/Gender: Treating RN: 03-Aug-1956 (66 y.o. [name] Primary Care Physician: [name] Other Clinician: Massie Kluver Referring Physician: Treating Physician/Extender: [name] Weeks in  Treatment: 36 Education Assessment Education Provided To: Patient Education Topics Provided Infection: Handouts: Other: instructed to go to ER for further evaluation Methods: Explain/Verbal Responses: State content correctly Wound/Skin Impairment: Handouts: Other: continue wound care as directed Methods: Explain/Verbal Responses: State content correctly Electronic Signature(s) Signed: 08/18/2022 11:23:10 AM By: Massie Kluver Entered By: Massie Kluver on 08/17/2022 08:58:04 Wound Assessment Details -------------------------------------------------------------------------------- [name] ([id number]) [id number] Page 7 of 10 Patient Name: Date of Service: [name], [name] 08/17/2022 8:15 A M Medical Record Number: [id number] Patient Account Number: [id number] Date of Birth/Sex: Treating RN: Nov 14, 1955 (66 y.o. Isac Sarna, Maudie Mercury Primary Care Roneshia Drew: [name] Other Clinician: Massie Kluver Referring Abdalrahman Clementson: Treating Jean Skow/Extender: [name] Weeks in Treatment: 40 Wound Status Wound Number: 1 Primary Etiology: Diabetic Wound/Ulcer of the Lower Extremity Wound Location: Right, Plantar Foot Wound Status: Open Wounding Event: Blister Comorbid History: Type II Diabetes, Osteoarthritis Date Acquired: 07/15/2020 Weeks Of Treatment: 40 Clustered Wound: No Photos Wound Measurements Length: (cm) 0.9 % Reduction in Area: 55.9% Width: (cm) 0.7 % Reduction in Volume: 78% Depth: (cm) 0.3 Epithelialization: None Area: (cm) 0.495 Volume: (cm) 0.148 Wound Description Classification: Grade 2 Foul Odor After Cleansing: No Wound Margin: Thickened Slough/Fibrino Yes Exudate Amount: Medium Exudate Type: Serosanguineous Exudate Color: red, brown Wound Bed Granulation Amount: Large (67-100%) Exposed Structure Granulation Quality: Red Fascia Exposed: No Necrotic Amount: Small (1-33%) Fat Layer (Subcutaneous Tissue)  Exposed: Yes Necrotic Quality: Adherent Slough Tendon Exposed: No Muscle Exposed: No Joint Exposed: No Bone Exposed: No Treatment Notes Wound #1 (Foot) Wound Laterality: Plantar, Right Cleanser Wound Cleanser Discharge Instruction: Wash your hands with soap and water. Remove old dressing, discard into plastic bag and place into trash. Cleanse the wound with Wound Cleanser prior to applying a clean dressing using gauze sponges, not tissues or cotton balls. Do not scrub or use excessive force. Pat dry using gauze sponges, not tissue or cotton balls. Peri-Wound Care Topical Primary Dressing PuraPly Discharge Instruction: #3 Secondary Dressing Zetuvit Plus 4x4 (in/in) Secured With Conform 4'' - Conforming Stretch Gauze Bandage 4x75 (in/in) Kolenda, [name] ([id number]) [id number] Page 8 of 10 Discharge Instruction: Apply as directed Compression Wrap Compression Stockings Add-Ons Electronic Signature(s) Signed: 08/17/2022 3:41:25 PM By: Gretta Cool, BSN, RN, CWS, Kim RN, BSN Signed: 08/18/2022 11:23:10 AM By: Massie Kluver Entered By: Massie Kluver on 08/17/2022 08:23:44 -------------------------------------------------------------------------------- Wound Assessment Details Patient Name: Date of Service: [name] 08/17/2022 8:15 A M Medical Record Number: [id number] Patient Account Number: [id number] Date of Birth/Sex: Treating RN: 06/14/1956 (66 y.o. Isac Sarna, Maudie Mercury Primary Care Lary Eckardt: [name] Other Clinician: Massie Kluver Referring Marisabel Macpherson: Treating Adraine Biffle/Extender: [name] Weeks in Treatment: 40 Wound Status Wound Number: 3 Primary Etiology: Diabetic Wound/Ulcer of the Lower Extremity Wound Location: Left, Plantar Foot Wound Status: Open Wounding Event: Pressure Injury Comorbid History: Type II Diabetes, Osteoarthritis Date Acquired: 07/24/2022 Weeks Of Treatment: 3 Clustered Wound: No Photos Wound  Measurements Length: (cm) 1.2 Width: (cm) 0.9 Depth: (cm) 2.1 Area: (cm) 0.848 Volume: (cm) 1.781 % Reduction in Area: -19.9% % Reduction in Volume: 31.9% Epithelialization: None Wound Description Classification: Grade 3 Wound Margin: Flat and Intact Exudate Amount: Medium Exudate Type: Serosanguineous Exudate Color: red, brown Foul Odor After Cleansing: No Slough/Fibrino Yes Wound Bed Granulation Amount: None Present (0%) Exposed Structure Necrotic Amount: Small (1-33%) Fascia Exposed: No Mcelwain, [name] ([id number]) [id number] Page 9 of 10 Fat Layer (Subcutaneous Tissue) Exposed: Yes Tendon Exposed: No Muscle Exposed: No Joint Exposed: No Bone Exposed: No Treatment Notes Wound #3 (Foot) Wound Laterality: Plantar, Left Cleanser Dakin 16 (oz) 0.25 Discharge Instruction: wet to dry dressing Wound Cleanser Discharge Instruction: Wash your hands with soap and water. Remove old dressing, discard into plastic bag and place into trash. Cleanse the wound with Wound Cleanser prior to applying a clean dressing using gauze sponges, not tissues or cotton balls. Do not scrub or use excessive force. Pat dry using gauze sponges, not tissue or cotton balls. Peri-Wound Care Topical Primary Dressing (BORDER) Zetuvit Plus Silicone Border Dressing 4x4 (in/in) Secondary Dressing Secured With Conform 4'' - Conforming Stretch Gauze Bandage 4x75 (in/in) Discharge Instruction: Apply as directed Compression Wrap Compression Stockings Add-Ons Electronic Signature(s) Signed: 08/17/2022 3:41:25 PM By: Gretta Cool, BSN, RN, CWS, Kim RN, BSN Signed: 08/18/2022 11:23:10 AM By: Massie Kluver Entered By: Massie Kluver on 08/17/2022 08:24:30 -------------------------------------------------------------------------------- Vitals Details Patient Name: Date of Service: [name] 08/17/2022 8:15 A M Medical Record Number: [id number] Patient Account Number:  [id number] Date of Birth/Sex: Treating RN: July 13, 1956 (66 y.o. [name] Primary Care Woodroe Vogan: [name] Other Clinician: Massie Kluver Referring Marvena Tally: Treating Satoya Feeley/Extender: [name] Weeks in Treatment: 40 Vital Signs Time Taken: 08:12 Temperature (F): 98.2 Height (in): 74 Pulse (bpm): 97 Weight (lbs): 226 Respiratory Rate (breaths/min): 18 Body Mass Index (BMI): 29 Blood Pressure (mmHg): 116/77 Reference Range: 80 - 120 mg / dl Electronic Signature(s) Signed: 08/18/2022 11:23:10 AM By:  Massie Kluver Entered By: Massie Kluver on 08/17/2022 08:16:06 [name] ([id number]) [id number] Page 10 of 10

## 2022-08-18 NOTE — Anesthesia Procedure Notes (Signed)
Procedure Name: LMA Insertion Date/Time: 08/18/2022 11:24 AM  Performed by: Johnna Acosta, CRNAPre-anesthesia Checklist: Patient identified, Emergency Drugs available, Suction available, Patient being monitored and Timeout performed Patient Re-evaluated:Patient Re-evaluated prior to induction Oxygen Delivery Method: Circle system utilized Preoxygenation: Pre-oxygenation with 100% oxygen Induction Type: IV induction LMA: LMA inserted LMA Size: 4.0 Tube type: Oral Number of attempts: 1 Placement Confirmation: positive ETCO2 and breath sounds checked- equal and bilateral Tube secured with: Tape Dental Injury: Teeth and Oropharynx as per pre-operative assessment

## 2022-08-18 NOTE — Progress Notes (Signed)
NYREE, YONKER ([id number]) [id number] Page 1 of 8 Visit Report for 08/10/2022 Arrival Information Details Patient Name: Date of Service: [name], [name] 08/10/2022 8:15 A M Medical Record Number: [id number] Patient Account Number: [id number] Date of Birth/Sex: Treating RN: 03/28/1956 (65 y.o. Jerilynn Mages) Carlene Coria Primary Care Marquite Attwood: [name] Other Clinician: Massie Kluver Referring Laryn Venning: Treating Kobi Aller/Extender: Wynell Balloon Weeks in Treatment: 75 Visit Information History Since Last Visit All ordered tests and consults were completed: No Patient Arrived: Ambulatory Added or deleted any medications: No Arrival Time: 08:59 Any new allergies or adverse reactions: No Transfer Assistance: None Had a fall or experienced change in No Patient Requires Transmission-Based Precautions: No activities of daily living that may affect Patient Has Alerts: Yes risk of falls: Patient Alerts: Patient on Blood Thinner Hospitalized since last visit: No DIABETIC Pain Present Now: Yes Plavix Electronic Signature(s) Signed: 08/18/2022 11:24:03 AM By: Massie Kluver Entered By: Massie Kluver on 08/10/2022 08:59:50 -------------------------------------------------------------------------------- Clinic Level of Care Assessment Details Patient Name: Date of Service: [name], [name] 08/10/2022 8:15 A M Medical Record Number: [id number] Patient Account Number: [id number] Date of Birth/Sex: Treating RN: [birth year] (65 y.o. Jerilynn Mages) Carlene Coria Primary Care Arna Luis: [name] Other Clinician: Massie Kluver Referring Deondrick Searls: Treating Quanika Solem/Extender: Wynell Balloon Weeks in Treatment: 39 Clinic Level of Care Assessment Items TOOL 1 Quantity Score []  - 0 Use when EandM and Procedure is performed on INITIAL visit ASSESSMENTS - Nursing Assessment / Reassessment []  - 0 General Physical Exam (combine w/ comprehensive  assessment (listed just below) when performed on new pt. evals) []  - 0 Comprehensive Assessment (HX, ROS, Risk Assessments, Wounds Hx, etc.) ASSESSMENTS - Wound and Skin Assessment / Reassessment []  - 0 Dermatologic / Skin Assessment (not related to wound area) ASSESSMENTS - Ostomy and/or Continence Assessment and Care Kawamoto, Adeyemi ([id number]) [id number] Page 2 of 8 []  - 0 Incontinence Assessment and Management []  - 0 Ostomy Care Assessment and Management (repouching, etc.) PROCESS - Coordination of Care []  - 0 Simple Patient / Family Education for ongoing care []  - 0 Complex (extensive) Patient / Family Education for ongoing care []  - 0 Staff obtains Programmer, systems, Records, T Results / Process Orders est []  - 0 Staff telephones HHA, Nursing Homes / Clarify orders / etc []  - 0 Routine Transfer to another Facility (non-emergent condition) []  - 0 Routine Hospital Admission (non-emergent condition) []  - 0 New Admissions / Biomedical engineer / Ordering NPWT Apligraf, etc. , []  - 0 Emergency Hospital Admission (emergent condition) PROCESS - Special Needs []  - 0 Pediatric / Minor Patient Management []  - 0 Isolation Patient Management []  - 0 Hearing / Language / Visual special needs []  - 0 Assessment of Community assistance (transportation, D/C planning, etc.) []  - 0 Additional assistance / Altered mentation []  - 0 Support Surface(s) Assessment (bed, cushion, seat, etc.) INTERVENTIONS - Miscellaneous []  - 0 External ear exam []  - 0 Patient Transfer (multiple staff / Civil Service fast streamer / Similar devices) []  - 0 Simple Staple / Suture removal (25 or less) []  - 0 Complex Staple / Suture removal (26 or more) []  - 0 Hypo/Hyperglycemic Management (do not check if billed separately) []  - 0 Ankle / Brachial Index (ABI) - do not check if billed separately Has the patient been seen at the hospital within the last three years: Yes Total Score: 0 Level Of  Care: ____ Electronic Signature(s) Signed: 08/18/2022 11:24:03 AM By: Massie Kluver Entered By: Massie Kluver on 08/10/2022 09:06:23 -------------------------------------------------------------------------------- Encounter Discharge Information Details  Patient Name: Date of Service: [name], [name] 08/10/2022 8:15 A M Medical Record Number: [id number] Patient Account Number: [id number] Date of Birth/Sex: Treating RN: 1955/12/23 (65 y.o. Jerilynn Mages) Carlene Coria Primary Care Glenisha Gundry: [name] Other Clinician: Massie Kluver Referring Herchel Hopkin: Treating Sahand Gosch/Extender: Wynell Balloon Weeks in Treatment: 50 Encounter Discharge Information Items Post Procedure Vitals Discharge Condition: Stable Temperature (F): 97.9 Shenk, Abimael ([id number]) [id number] Page 3 of 8 Ambulatory Status: Ambulatory Pulse (bpm): 79 Discharge Destination: Home Respiratory Rate (breaths/min): 18 Transportation: Private Auto Blood Pressure (mmHg): 124/74 Accompanied By: self Schedule Follow-up Appointment: Yes Clinical Summary of Care: Electronic Signature(s) Signed: 08/18/2022 11:24:03 AM By: Massie Kluver Entered By: Massie Kluver on 08/10/2022 09:07:54 -------------------------------------------------------------------------------- Lower Extremity Assessment Details Patient Name: Date of Service: [name], [name] 08/10/2022 8:15 A M Medical Record Number: [id number] Patient Account Number: [id number] Date of Birth/Sex: Treating RN: [birth year] (65 y.o. Jerilynn Mages) Carlene Coria Primary Care Taha Dimond: [name] Other Clinician: Massie Kluver Referring Antoni Stefan: Treating Charmain Diosdado/Extender: Wynell Balloon Weeks in Treatment: 39 Electronic Signature(s) Signed: 08/12/2022 12:25:01 PM By: Carlene Coria RN Signed: 08/18/2022 11:24:03 AM By: Massie Kluver Entered By: Massie Kluver on 08/10/2022  09:01:39 -------------------------------------------------------------------------------- Multi Wound Chart Details Patient Name: Date of Service: [name] 08/10/2022 8:15 A M Medical Record Number: [id number] Patient Account Number: [id number] Date of Birth/Sex: Treating RN: 09/08/1956 (65 y.o. 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 Type II Diabetes, Osteoarthritis Comorbid History: 07/15/2020 Date Acquired: 39 Weeks of Treatment: Open Wound Status: No Wound Recurrence: 0.7x0.7x0.2 Measurements L x W x D (cm) 0.385 A  (cm) : rea 0.077 Volume (cm) : 65.70% % Reduction in A rea: 88.60% % Reduction in Volume: Grade 2 Classification: Medium Exudate A mount: Serosanguineous Exudate Type: red, brown Exudate Color: Thickened Wound Margin: Large (67-100%) Granulation A  mount: Red Granulation Quality: Small (1-33%) Necrotic A mount: Fat Layer (Subcutaneous Tissue): Yes Fat Layer (Subcutaneous Tissue): Yes N/A Exposed Structures: Fascia: No Tendon: No Muscle: No Joint: No Bone: No None Epithelialization:] [3:Diabetic  Wound/Ulcer of the Lower Extremity Type II Diabetes, Osteoarthritis 07/24/2022 2 Open No 1.2x0.8x1.4 0.754 1.056 -6.60% 59.60% Grade 3 Medium Serosanguineous red, brown Flat and Intact None Present (0%) N/A Small (1-33%) Fascia: No Tendon: No Muscle: No  Joint: No Bone: No None] [N/A:[id number] Page  4 of 8 N/A N/A N/A N/A N/A N/A N/A N/A N/A N/A N/A N/A N/A N/A N/A N/A N/A N/A N/A N/A] Treatment Notes Electronic Signature(s) Signed: 08/18/2022 11:24:03 AM By: Massie Kluver Entered By: Massie Kluver on 08/10/2022 09:02:17 -------------------------------------------------------------------------------- [name] Details Patient Name: Date of Service: [name] 08/10/2022 8:15 A M Medical Record Number: [id number] Patient Account Number: [id number] Date of Birth/Sex: Treating RN: 07/18/1956 (65 y.o. Jerilynn Mages) Carlene Coria Primary Care Ferol Laiche: [name] Other Clinician: Massie Kluver Referring Manav Pierotti: Treating Jalisia Puchalski/Extender: Wynell Balloon Weeks in Treatment: 39 Active Inactive Wound/Skin Impairment Nursing Diagnoses: Impaired tissue integrity Knowledge deficit related to smoking impact on wound healing Knowledge deficit related to ulceration/compromised skin integrity Goals: Patient/caregiver will verbalize understanding of skin care regimen Date Initiated: 11/10/2021  Date Inactivated: 12/01/2021 Target Resolution Date: 11/17/2021 Goal Status: Met Ulcer/skin breakdown will have a volume reduction of 30% by week 4 Date Initiated: 11/10/2021 Date Inactivated: 02/02/2022 Target Resolution Date: 12/08/2021 Goal Status: Met Ulcer/skin breakdown will have a volume reduction of 50% by week 8 Date Initiated: 11/10/2021 Target Resolution Date: 01/05/2022 [name], [name] ([id number]) [id number] Page 5 of 8 Goal Status: Active Ulcer/skin breakdown will have a volume reduction of 80% by week 12 Date Initiated: 11/10/2021 Target Resolution Date: 02/02/2022 Goal Status: Active Ulcer/skin breakdown will heal within 14 weeks Date Initiated: 11/10/2021 Target Resolution Date: 02/16/2022 Goal Status: Active Interventions: Assess patient/caregiver ability to obtain necessary supplies Assess patient/caregiver ability to perform  ulcer/skin care regimen upon admission and as needed Assess ulceration(s) every visit Notes: Electronic Signature(s) Signed: 08/12/2022 12:25:01 PM By: Carlene Coria RN Signed: 08/18/2022 11:24:03 AM By: Massie Kluver Entered By: Massie Kluver on 08/10/2022 09:02:00 -------------------------------------------------------------------------------- Pain Assessment Details Patient Name: Date of Service: [name] 08/10/2022 8:15 A M Medical Record Number: [id number] Patient Account Number: [id number] Date of Birth/Sex: Treating RN: 01-14-1956 (65 y.o. Oval Linsey Primary Care Dondi Aime: [name] Other Clinician: Massie Kluver Referring Jarreau Callanan: Treating Keishawn Darsey/Extender: Wynell Balloon Weeks in Treatment: 39 Active Problems Location of Pain Severity and Description of Pain Patient Has Paino Yes Site Locations Pain Location: Pain in Ulcers Duration of the Pain. Constant / Intermittento Constant Rate the pain. Current Pain Level: 6 Character of Pain Describe the Pain: Sharp, Shooting Pain Management and Medication Current Pain Management: Medication: Yes Rest: Yes How does your wound impact your activities of daily livingo Covello, [name] ([id number]) [id number] Page 6 of 8 Sleep: Yes Electronic Signature(s) Signed: 08/12/2022 12:25:01 PM By: Carlene Coria RN Signed: 08/18/2022 11:24:03 AM By: Massie Kluver Entered By: Massie Kluver on 08/10/2022 09:00:52 -------------------------------------------------------------------------------- Patient/Caregiver Education Details Patient Name: Date of Service: [name] 10/11/2023andnbsp8:15 A M Medical Record Number: [id number] Patient Account Number: [id number] Date of Birth/Gender: Treating RN: Sep 01, 1956 (65 y.o. Jerilynn Mages) Carlene Coria Primary Care Physician: [name] Other Clinician: Massie Kluver Referring Physician: Treating Physician/Extender: Wynell Balloon Weeks in Treatment: 73 Education Assessment Education Provided To: Patient Education Topics Provided Wound/Skin Impairment: Handouts: Other: continue wound care as directed Methods: Explain/Verbal Responses: State content correctly Electronic Signature(s) Signed: 08/18/2022 11:24:03 AM By: Massie Kluver Entered By: Massie Kluver on 08/10/2022 09:06:46 -------------------------------------------------------------------------------- Wound Assessment Details Patient Name: Date of Service: [name] 08/10/2022 8:15 A M Medical Record Number: [id number] Patient Account Number: [id number] Date of Birth/Sex: Treating RN: September 30, 1956 (65 y.o. Oval Linsey Primary Care Keidrick Murty: [name] Other Clinician: Massie Kluver Referring Arlett Goold: Treating Chaia Ikard/Extender: Wynell Balloon Weeks in Treatment: 39 Wound Status Wound Number: 1 Primary Etiology: Diabetic Wound/Ulcer of the Lower Extremity Wound Location: Right, Plantar Foot Wound Status: Open Wounding Event: Blister Comorbid History: Type II Diabetes, Osteoarthritis [name], [name] ([id number]) [id number] Page 7 of 8 Date Acquired: 07/15/2020 Weeks Of Treatment: 39 Clustered Wound: No Wound Measurements Length: (cm) 0.7 Width: (cm) 0.7 Depth: (cm) 0.2 Area: (cm) 0.385 Volume: (cm) 0.077 % Reduction in Area: 65.7% % Reduction in Volume: 88.6% Epithelialization: None Wound Description Classification: Grade 2 Wound Margin: Thickened Exudate Amount: Medium Exudate Type: Serosanguineous Exudate Color: red, brown Foul Odor After Cleansing: No Slough/Fibrino Yes Wound Bed Granulation Amount: Large (67-100%) Exposed Structure Granulation Quality: Red Fascia Exposed: No Necrotic Amount: Small (1-33%) Fat Layer (Subcutaneous Tissue) Exposed: Yes Necrotic Quality: Adherent Slough Tendon Exposed: No Muscle Exposed: No Joint Exposed:  No Bone Exposed: No Electronic Signature(s) Signed: 08/12/2022 12:25:01 PM By: Carlene Coria RN Signed: 08/18/2022 11:24:03 AM By: Massie Kluver Entered By: Massie Kluver on 08/10/2022 09:01:30 -------------------------------------------------------------------------------- Wound Assessment Details Patient Name: Date of Service: [name] 08/10/2022 8:15 A M Medical Record Number: [id number] Patient Account Number: [id number] Date of Birth/Sex: Treating RN: 07-27-1956 (65 y.o. Jerilynn Mages) Carlene Coria Primary Care Sohan Potvin: [name] Other Clinician: Massie Kluver Referring Tamatha Gadbois: Treating Tomeeka Plaugher/Extender: Wynell Balloon Weeks in Treatment: 39 Wound Status Wound Number: 3 Primary Etiology: Diabetic Wound/Ulcer of the Lower Extremity Wound Location: Left, Plantar Foot Wound Status: Open Wounding Event: Pressure Injury Comorbid History: Type II Diabetes, Osteoarthritis Date Acquired: 07/24/2022 Weeks Of Treatment: 2 Clustered Wound: No Wound Measurements Length: (cm) 1.2 Width: (cm) 0.8 Depth: (cm) 1.4 Area: (cm) 0.754 Volume: (cm) 1.056 % Reduction in Area: -6.6% % Reduction in Volume: 59.6% Epithelialization: None Wound Description Classification: Grade 3 Wound Margin: Flat and Intact [name], [name] ([id number]) Exudate Amount: Medium Exudate Type: Serosanguineous Exudate Color: red, brown Foul Odor After Cleansing: No Slough/Fibrino Yes [id number] Page 8 of 8 Wound Bed Granulation Amount: None Present (0%) Exposed Structure Necrotic Amount: Small (1-33%) Fascia Exposed: No Fat Layer (Subcutaneous Tissue) Exposed: Yes Tendon Exposed: No Muscle Exposed: No Joint Exposed: No Bone Exposed: No Electronic Signature(s) Signed: 08/12/2022 12:25:01 PM By: Carlene Coria RN Signed: 08/18/2022 11:24:03 AM By: Massie Kluver Entered By: Massie Kluver on 08/10/2022  09:01:34 -------------------------------------------------------------------------------- Vitals Details Patient Name: Date of Service: [name] 08/10/2022 8:15 A M Medical Record Number: [id number] Patient Account Number: [id number] Date of Birth/Sex: Treating RN: January 24, 1956 (65 y.o. Jerilynn Mages) Carlene Coria Primary Care Squire Withey: [name] Other Clinician: Massie Kluver Referring Kari Kerth: Treating Bookert Guzzi/Extender: Wynell Balloon Weeks in Treatment: 39 Vital Signs Time Taken: 08:25 Temperature (F): 97.9 Height (in): 74 Pulse (bpm): 79 Weight (lbs): 226 Respiratory Rate (breaths/min): 18 Body Mass Index (BMI): 29 Blood Pressure (mmHg): 124/74 Reference Range: 80 - 120 mg / dl Electronic Signature(s) Signed: 08/18/2022 11:24:03 AM By: Massie Kluver Entered By: Massie Kluver on 08/10/2022 09:00:15

## 2022-08-18 NOTE — Transfer of Care (Signed)
Immediate Anesthesia Transfer of Care Note  Patient: [name]  Procedure(s) Performed: TRANSMETATARSAL AMPUTATION (Left: Foot) ACHILLES LENGTHENING/KIDNER (Left)  Patient Location: PACU  Anesthesia Type:General  Level of Consciousness: awake, alert  and oriented  Airway & Oxygen Therapy: Patient Spontanous Breathing and Patient connected to face mask oxygen  Post-op Assessment: Report given to RN and Post -op Vital signs reviewed and stable  Post vital signs: Reviewed and stable  Last Vitals:  Vitals Value Taken Time  BP 113/54 08/18/22 1219  Temp    Pulse 65 08/18/22 1221  Resp 15 08/18/22 1221  SpO2 100 % 08/18/22 1221  Vitals shown include unvalidated device data.  Last Pain:  Vitals:   08/18/22 1003  TempSrc: Oral  PainSc: 6          Complications: No notable events documented.

## 2022-08-19 ENCOUNTER — Encounter: Payer: Self-pay | Admitting: Podiatry

## 2022-08-19 DIAGNOSIS — M86172 Other acute osteomyelitis, left ankle and foot: Secondary | ICD-10-CM

## 2022-08-19 DIAGNOSIS — N1831 Chronic kidney disease, stage 3a: Secondary | ICD-10-CM | POA: Diagnosis not present

## 2022-08-19 DIAGNOSIS — E1169 Type 2 diabetes mellitus with other specified complication: Secondary | ICD-10-CM | POA: Diagnosis not present

## 2022-08-19 LAB — GLUCOSE, CAPILLARY
Glucose-Capillary: 171 mg/dL — ABNORMAL HIGH (ref 70–99)
Glucose-Capillary: 154 mg/dL — ABNORMAL HIGH (ref 70–99)

## 2022-08-19 LAB — CBC
HCT: 36.9 % — ABNORMAL LOW (ref 39.0–52.0)
Hemoglobin: 12 g/dL — ABNORMAL LOW (ref 13.0–17.0)
MCH: 26.9 pg (ref 26.0–34.0)
MCHC: 32.5 g/dL (ref 30.0–36.0)
MCV: 82.7 fL (ref 80.0–100.0)
Platelets: 321 10*3/uL (ref 150–400)
RBC: 4.46 MIL/uL (ref 4.22–5.81)
RDW: 17.2 % — ABNORMAL HIGH (ref 11.5–15.5)
WBC: 8.7 10*3/uL (ref 4.0–10.5)
nRBC: 0 % (ref 0.0–0.2)

## 2022-08-19 LAB — BASIC METABOLIC PANEL
Anion gap: 10 (ref 5–15)
BUN: 26 mg/dL — ABNORMAL HIGH (ref 8–23)
CO2: 25 mmol/L (ref 22–32)
Calcium: 8.8 mg/dL — ABNORMAL LOW (ref 8.9–10.3)
Chloride: 100 mmol/L (ref 98–111)
Creatinine, Ser: 1.18 mg/dL (ref 0.61–1.24)
GFR, Estimated: >60 mL/min (ref >60)
Glucose, Bld: 166 mg/dL — ABNORMAL HIGH (ref 70–99)
Potassium: 4.1 mmol/L (ref 3.5–5.1)
Sodium: 135 mmol/L (ref 135–145)

## 2022-08-19 LAB — HEMOGLOBIN A1C
Hgb A1c MFr Bld: 7 % — ABNORMAL HIGH (ref 4.8–5.6)
Mean Plasma Glucose: 154.2 mg/dL

## 2022-08-19 MED ORDER — DOXYCYCLINE HYCLATE 100 MG PO TBEC: 100.0000 mg | DELAYED_RELEASE_TABLET | Freq: Two times a day (BID) | ORAL | 0 refills | Status: AC

## 2022-08-19 NOTE — Plan of Care (Signed)
  Problem: Nutritional: Goal: Maintenance of adequate nutrition will improve Outcome: Progressing   Problem: Education: Goal: Knowledge of General Education information will improve Description: Including pain rating scale, medication(s)/side effects and non-pharmacologic comfort measures Outcome: Progressing   Problem: Health Behavior/Discharge Planning: Goal: Ability to manage health-related needs will improve Outcome: Progressing   Problem: Clinical Measurements: Goal: Ability to maintain clinical measurements within normal limits will improve Outcome: Progressing Goal: Respiratory complications will improve Outcome: Progressing Goal: Cardiovascular complication will be avoided Outcome: Progressing   Problem: Elimination: Goal: Will not experience complications related to bowel motility Outcome: Progressing Goal: Will not experience complications related to urinary retention Outcome: Progressing   Problem: Pain Managment: Goal: General experience of comfort will improve Outcome: Progressing   Problem: Safety: Goal: Ability to remain free from injury will improve Outcome: Progressing

## 2022-08-19 NOTE — Progress Notes (Signed)
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  A cane or walker will not resolve  issue with performing activities of daily living. A wheelchair will allow patient to safely perform daily activities. Patient is not able to propel themselves in the home using a standard weight wheelchair due to general weakness. Patient can self propel in the lightweight wheelchair. Length of need 12 months .  Accessories: elevating leg rests (ELRs), wheel locks, extensions and anti-tippers. Back cushion

## 2022-08-19 NOTE — Consult Note (Signed)
Pharmacy Antibiotic Note  [name] is a 66 y.o. male admitted on 08/17/2022 with chronic foot wound. PMH significant for DM, CKD (baseline Scr 1.1-1.3), CVA. Patient presented with L foot pain redness and swelling x2 weeks and chronic R foot wound. Cultures from [facility] 01/2022 grew VRE and mixed GPCs (treated with Unasyn x4.5 wks). Recently prescribed Augmentin and doxycycline x14 days on 07/27/2022 and levofloxacin x7 days on 08/10/2022 with minimal improvement. 10/18 MRI of L foot concerning for OM of toes with evidence of fluid collection/abscess around second MTP joint. Underwent L TMA with podiatry on 10/19. Pharmacy has been consulted for vancomycin dosing.  Plan: Day 3 of antibiotics Continue Vancomycin 1000 mg IV Q12H. Goal AUC 400-550. Expected AUC: 431.7 Expected Css min: 12.8 SCr used: 1.18 (at baseline) Weight used: IBW, Vd used: 0.72 (BMI 28.8) Consider checking Vanc levels 10/22 Patient is also on Ceftiaxone 2 g IV Q24H and Metronidazole 500 mg Q12H Continue to monitor renal function and follow culture results   Height: 6\' 2"  (188 cm) Weight: 102.1 kg (225 lb) IBW/kg (Calculated) : 82.2  Temp (24hrs), Avg:98 F (36.7 C), Min:97 F (36.1 C), Max:99 F (37.2 C)  Recent Labs  Lab 08/17/22 0936 08/18/22 0434 08/19/22 0353  WBC 7.8 6.2 8.7  CREATININE 1.42* 1.23 1.18  LATICACIDVEN 1.6  --   --      Estimated Creatinine Clearance: 77.5 mL/min (by C-G formula based on SCr of 1.18 mg/dL).    No Known Allergies  Antimicrobials this admission: 10/18 Ceftriaxone >> 10/18 Vancomycin >>  10/18 Metronidazole >>   Dose adjustments this admission: 10/19 Vancomycin 2000 mg IV Q24H >> Vancomycin 1000 mg IV Q12H   Microbiology results: 10/18 BCx: NG2D  Thank you for allowing pharmacy to be a part of this patient's care.  Gretel Acre, PharmD PGY1 Pharmacy Resident 08/19/2022 8:49 AM

## 2022-08-19 NOTE — Progress Notes (Signed)
  Subjective:  Patient ID: [name], male    DOB: [birth year],  MRN: [id number]  Patient seen at bedside POD #1 status post left transmetatarsal amputation doing well not having much pain.  Negative for chest pain and shortness of breath Fever: no Night sweats: no Objective:   Vitals:   08/18/22 1953 08/19/22 0754  BP: 124/71 113/70  Pulse: 78 85  Resp: 20 16  Temp: 98.1 F (36.7 C) 99 F (37.2 C)  SpO2: 97% 96%   General AA&O x3. Normal mood and affect.  Vascular Extremity warm and well-perfused  Neurologic Epicritic sensation grossly reduced  Dermatologic Dressing clean dry and intact  Orthopedic: MMT 5/5 in dorsiflexion, plantarflexion, inversion, and eversion. Normal joint ROM without pain or crepitus.    Assessment & Plan:  Patient was evaluated and treated and all questions answered.   POD #1 status post left transmetatarsal amputation -NWB LLE -Stable for discharge from our standpoint -Follow-up scheduled for 08/30/2022 with Dr. Gerri Lins, DPM  Accessible via secure chat for questions or concerns.

## 2022-08-19 NOTE — Plan of Care (Signed)

## 2022-08-19 NOTE — Evaluation (Signed)
Physical Therapy Evaluation Patient Details Name: [name] MRN: [id number] DOB: [birth year] Today's Date: 08/19/2022  History of Present Illness  Pt is a 66 yo male s/p transmetatarsal amputation, L achilles tendon lengthening. PMH of smoking, HTN, CHF, DM, depression.   Clinical Impression  Patient alert, oriented x4, denied pain. Pt reported at baseline he is independent, has a neighbor that can assist with driving/groceries/meal prep if needed. Denied any falls in the last 6 months.  The patient was able to perform bed mobility modI. Sit <> stand with RW and supervision but pt unable to maintain NWB precautions. Ambulated ~1ft total in room with inability to truly hop to maintain NWB (noted for one hop but when attempting to move to recliner noted for weight bearing in LLE. Extensive time reviewing precautions, discussing DME, plan for care at home, and contacting MD/RN.  Overall the patient demonstrated deficits (see "PT Problem List") that impede the patient's functional abilities, safety, and mobility and would benefit from skilled PT intervention. Recommendations at this time is home and to follow physician's recommendations for follow up therapy needs as well as a WC to allow pt to remain as NWB as possible on LLE.        Recommendations for follow up therapy are one component of a multi-disciplinary discharge planning process, led by the attending physician.  Recommendations may be updated based on patient status, additional functional criteria and insurance authorization.  Follow Up Recommendations Follow physician's recommendations for discharge plan and follow up therapies      Assistance Recommended at Discharge Intermittent Supervision/Assistance  Patient can return home with the following  Help with stairs or ramp for entrance;Assist for transportation;Assistance with cooking/housework    Equipment Recommendations Wheelchair (measurements PT);Wheelchair cushion  (measurements PT) (with elevating leg rests)  Recommendations for Other Services       Functional Status Assessment Patient has had a recent decline in their functional status and demonstrates the ability to make significant improvements in function in a reasonable and predictable amount of time.     Precautions / Restrictions Precautions Precautions: Fall Required Braces or Orthoses: Other Brace Other Brace: cam boot Restrictions Weight Bearing Restrictions: Yes LLE Weight Bearing: Non weight bearing Other Position/Activity Restrictions: per MD NWB as much as possible, if necessary heel weight bearing      Mobility  Bed Mobility Overal bed mobility: Modified Independent                  Transfers Overall transfer level: Needs assistance Equipment used: Rolling walker (2 wheels) Transfers: Sit to/from Stand Sit to Stand: Supervision           General transfer comment: unable to maintain NWB precautions    Ambulation/Gait Ambulation/Gait assistance: Supervision Gait Distance (Feet): 3 Feet Assistive device: Rolling walker (2 wheels)         General Gait Details: unable to maintain NWB, 1 hop with proper precautions noted  Stairs            Wheelchair Mobility    Modified Rankin (Stroke Patients Only)       Balance Overall balance assessment: Needs assistance Sitting-balance support: Feet supported Sitting balance-Leahy Scale: Good     Standing balance support: Reliant on assistive device for balance Standing balance-Leahy Scale: Fair                               Pertinent Vitals/Pain Pain Assessment Pain Assessment: No/denies pain  Home Living Family/patient expects to be discharged to:: Private residence Living Arrangements: Alone Available Help at Discharge: Neighbor Type of Home: House Home Access: Stairs to enter Entrance Stairs-Rails: Can reach both Entrance Stairs-Number of Steps: 1   Home Layout: One  level Home Equipment: Grab bars - toilet;Grab bars - tub/shower;Tub bench;Rolling Walker (2 wheels)      Prior Function Prior Level of Function : Independent/Modified Independent;Driving                     Hand Dominance        Extremity/Trunk Assessment   Upper Extremity Assessment Upper Extremity Assessment: Overall WFL for tasks assessed    Lower Extremity Assessment Lower Extremity Assessment: Generalized weakness (able to move both extremities against gravity freely)       Communication   Communication: No difficulties  Cognition Arousal/Alertness: Awake/alert Behavior During Therapy: WFL for tasks assessed/performed Overall Cognitive Status: Within Functional Limits for tasks assessed                                          General Comments      Exercises     Assessment/Plan    PT Assessment Patient needs continued PT services  PT Problem List Decreased mobility;Decreased strength;Decreased balance;Decreased knowledge of use of DME;Pain       PT Treatment Interventions DME instruction;Therapeutic exercise;Gait training;Balance training;Stair training;Neuromuscular re-education;Wheelchair mobility training;Functional mobility training;Therapeutic activities;Patient/family education    PT Goals (Current goals can be found in the Care Plan section)  Acute Rehab PT Goals Patient Stated Goal: to go home PT Goal Formulation: With patient Time For Goal Achievement: 09/02/22 Potential to Achieve Goals: Good    Frequency 7X/week     Co-evaluation               AM-PAC PT "6 Clicks" Mobility  Outcome Measure Help needed turning from your back to your side while in a flat bed without using bedrails?: None Help needed moving from lying on your back to sitting on the side of a flat bed without using bedrails?: None Help needed moving to and from a bed to a chair (including a wheelchair)?: None Help needed standing up from a chair  using your arms (e.g., wheelchair or bedside chair)?: None Help needed to walk in hospital room?: A Little Help needed climbing 3-5 steps with a railing? : A Lot 6 Click Score: 21    End of Session Equipment Utilized During Treatment: Gait belt Activity Tolerance: Patient tolerated treatment well Patient left: in chair;with call bell/phone within reach Nurse Communication: Mobility status;Precautions;Weight bearing status PT Visit Diagnosis: Other abnormalities of gait and mobility (R26.89)    Time: 0998-3382 PT Time Calculation (min) (ACUTE ONLY): 28 min   Charges:   PT Evaluation $PT Eval Low Complexity: 1 Low PT Treatments $Therapeutic Activity: 8-22 mins       Olga Coaster PT, DPT 10:43 AM,08/19/22

## 2022-08-19 NOTE — Plan of Care (Signed)
Problem: Education: Goal: Ability to describe self-care measures that may prevent or decrease complications (Diabetes Survival Skills Education) will improve 08/19/2022 1303 by Orma Render, RN Outcome: Adequate for Discharge 08/19/2022 0753 by Orma Render, RN Outcome: Progressing Goal: Individualized Educational Video(s) 08/19/2022 1303 by Orma Render, RN Outcome: Adequate for Discharge 08/19/2022 0753 by Orma Render, RN Outcome: Progressing   Problem: Coping: Goal: Ability to adjust to condition or change in health will improve 08/19/2022 1303 by Orma Render, RN Outcome: Adequate for Discharge 08/19/2022 0753 by Orma Render, RN Outcome: Progressing   Problem: Fluid Volume: Goal: Ability to maintain a balanced intake and output will improve 08/19/2022 1303 by Orma Render, RN Outcome: Adequate for Discharge 08/19/2022 0753 by Orma Render, RN Outcome: Progressing   Problem: Health Behavior/Discharge Planning: Goal: Ability to identify and utilize available resources and services will improve 08/19/2022 1303 by Orma Render, RN Outcome: Adequate for Discharge 08/19/2022 0753 by Orma Render, RN Outcome: Progressing Goal: Ability to manage health-related needs will improve 08/19/2022 1303 by Orma Render, RN Outcome: Adequate for Discharge 08/19/2022 0753 by Orma Render, RN Outcome: Progressing   Problem: Metabolic: Goal: Ability to maintain appropriate glucose levels will improve 08/19/2022 1303 by Orma Render, RN Outcome: Adequate for Discharge 08/19/2022 0753 by Orma Render, RN Outcome: Progressing   Problem: Nutritional: Goal: Maintenance of adequate nutrition will improve 08/19/2022 1303 by Orma Render, RN Outcome: Adequate for Discharge 08/19/2022 0753 by Orma Render, RN Outcome: Progressing Goal: Progress toward achieving an optimal weight will improve 08/19/2022 1303 by Orma Render, RN Outcome: Adequate for  Discharge 08/19/2022 0753 by Orma Render, RN Outcome: Progressing   Problem: Skin Integrity: Goal: Risk for impaired skin integrity will decrease 08/19/2022 1303 by Orma Render, RN Outcome: Adequate for Discharge 08/19/2022 0753 by Orma Render, RN Outcome: Progressing   Problem: Tissue Perfusion: Goal: Adequacy of tissue perfusion will improve 08/19/2022 1303 by Orma Render, RN Outcome: Adequate for Discharge 08/19/2022 0753 by Orma Render, RN Outcome: Progressing   Problem: Education: Goal: Knowledge of General Education information will improve Description: Including pain rating scale, medication(s)/side effects and non-pharmacologic comfort measures 08/19/2022 1303 by Orma Render, RN Outcome: Adequate for Discharge 08/19/2022 0753 by Orma Render, RN Outcome: Progressing   Problem: Health Behavior/Discharge Planning: Goal: Ability to manage health-related needs will improve 08/19/2022 1303 by Orma Render, RN Outcome: Adequate for Discharge 08/19/2022 0753 by Orma Render, RN Outcome: Progressing   Problem: Clinical Measurements: Goal: Ability to maintain clinical measurements within normal limits will improve 08/19/2022 1303 by Orma Render, RN Outcome: Adequate for Discharge 08/19/2022 0753 by Orma Render, RN Outcome: Progressing Goal: Will remain free from infection 08/19/2022 1303 by Orma Render, RN Outcome: Adequate for Discharge 08/19/2022 0753 by Orma Render, RN Outcome: Progressing Goal: Diagnostic test results will improve 08/19/2022 1303 by Orma Render, RN Outcome: Adequate for Discharge 08/19/2022 0753 by Orma Render, RN Outcome: Progressing Goal: Respiratory complications will improve 08/19/2022 1303 by Orma Render, RN Outcome: Adequate for Discharge 08/19/2022 0753 by Orma Render, RN Outcome: Progressing Goal: Cardiovascular complication will be avoided 08/19/2022 1303 by Orma Render, RN Outcome: Adequate for  Discharge 08/19/2022 0753 by Orma Render, RN Outcome: Progressing   Problem: Activity: Goal: Risk for activity intolerance will decrease 08/19/2022 1303 by Orma Render, RN Outcome: Adequate for Discharge  08/19/2022 0753 by Orma Render, RN Outcome: Progressing   Problem: Nutrition: Goal: Adequate nutrition will be maintained 08/19/2022 1303 by Orma Render, RN Outcome: Adequate for Discharge 08/19/2022 0753 by Orma Render, RN Outcome: Progressing   Problem: Coping: Goal: Level of anxiety will decrease 08/19/2022 1303 by Orma Render, RN Outcome: Adequate for Discharge 08/19/2022 0753 by Orma Render, RN Outcome: Progressing   Problem: Elimination: Goal: Will not experience complications related to bowel motility 08/19/2022 1303 by Orma Render, RN Outcome: Adequate for Discharge 08/19/2022 0753 by Orma Render, RN Outcome: Progressing Goal: Will not experience complications related to urinary retention 08/19/2022 1303 by Orma Render, RN Outcome: Adequate for Discharge 08/19/2022 0753 by Orma Render, RN Outcome: Progressing   Problem: Pain Managment: Goal: General experience of comfort will improve 08/19/2022 1303 by Orma Render, RN Outcome: Adequate for Discharge 08/19/2022 0753 by Orma Render, RN Outcome: Progressing   Problem: Safety: Goal: Ability to remain free from injury will improve 08/19/2022 1303 by Orma Render, RN Outcome: Adequate for Discharge 08/19/2022 0753 by Orma Render, RN Outcome: Progressing   Problem: Skin Integrity: Goal: Risk for impaired skin integrity will decrease 08/19/2022 1303 by Orma Render, RN Outcome: Adequate for Discharge 08/19/2022 0753 by Orma Render, RN Outcome: Progressing   Problem: Education: Goal: Knowledge of General Education information will improve Description: Including pain rating scale, medication(s)/side effects and non-pharmacologic comfort measures 08/19/2022 1303 by Orma Render, RN Outcome: Adequate for Discharge 08/19/2022 0753 by Orma Render, RN Outcome: Progressing   Problem: Health Behavior/Discharge Planning: Goal: Ability to manage health-related needs will improve 08/19/2022 1303 by Orma Render, RN Outcome: Adequate for Discharge 08/19/2022 0753 by Orma Render, RN Outcome: Progressing   Problem: Clinical Measurements: Goal: Ability to maintain clinical measurements within normal limits will improve 08/19/2022 1303 by Orma Render, RN Outcome: Adequate for Discharge 08/19/2022 0753 by Orma Render, RN Outcome: Progressing Goal: Will remain free from infection 08/19/2022 1303 by Orma Render, RN Outcome: Adequate for Discharge 08/19/2022 0753 by Orma Render, RN Outcome: Progressing Goal: Diagnostic test results will improve 08/19/2022 1303 by Orma Render, RN Outcome: Adequate for Discharge 08/19/2022 0753 by Orma Render, RN Outcome: Progressing Goal: Respiratory complications will improve 08/19/2022 1303 by Orma Render, RN Outcome: Adequate for Discharge 08/19/2022 0753 by Orma Render, RN Outcome: Progressing Goal: Cardiovascular complication will be avoided 08/19/2022 1303 by Orma Render, RN Outcome: Adequate for Discharge 08/19/2022 0753 by Orma Render, RN Outcome: Progressing   Problem: Activity: Goal: Risk for activity intolerance will decrease 08/19/2022 1303 by Orma Render, RN Outcome: Adequate for Discharge 08/19/2022 0753 by Orma Render, RN Outcome: Progressing   Problem: Nutrition: Goal: Adequate nutrition will be maintained 08/19/2022 1303 by Orma Render, RN Outcome: Adequate for Discharge 08/19/2022 0753 by Orma Render, RN Outcome: Progressing   Problem: Coping: Goal: Level of anxiety will decrease 08/19/2022 1303 by Orma Render, RN Outcome: Adequate for Discharge 08/19/2022 0753 by Orma Render, RN Outcome: Progressing   Problem: Elimination: Goal: Will not experience  complications related to bowel motility 08/19/2022 1303 by Orma Render, RN Outcome: Adequate for Discharge 08/19/2022 0753 by Orma Render, RN Outcome: Progressing Goal: Will not experience complications related to urinary retention 08/19/2022 1303 by Orma Render, RN Outcome: Adequate for Discharge 08/19/2022 0753 by Orma Render, RN Outcome: Progressing  Problem: Pain Managment: Goal: General experience of comfort will improve 08/19/2022 1303 by Alferd Apa, RN Outcome: Adequate for Discharge 08/19/2022 0753 by Alferd Apa, RN Outcome: Progressing   Problem: Safety: Goal: Ability to remain free from injury will improve 08/19/2022 1303 by Alferd Apa, RN Outcome: Adequate for Discharge 08/19/2022 0753 by Alferd Apa, RN Outcome: Progressing   Problem: Skin Integrity: Goal: Risk for impaired skin integrity will decrease 08/19/2022 1303 by Alferd Apa, RN Outcome: Adequate for Discharge 08/19/2022 0753 by Alferd Apa, RN Outcome: Progressing

## 2022-08-19 NOTE — Progress Notes (Addendum)
Met with the patient and discussed DC plan and needs He lives alone His neighbor and friend Merry Proud will assist him He has a rolling walker but due to non weight bearing on left foot will need a Wheelchair [employer] to deliver He will transport himself I explained that is not the best option, he stated his friendJeff will help Patient wants to wait on [place] until he is able to bear weight

## 2022-08-19 NOTE — Discharge Summary (Signed)
Physician Discharge Summary  [name] DJM:[id number] DOB: [birth year] DOA: 08/17/2022  PCP: Peggye Form, NP  Admit date: 08/17/2022 Discharge date: 08/19/2022  Admitted From: home  Disposition:  home  Recommendations for Outpatient Follow-up:  Follow up with PCP in 1-2 weeks F/u w/ podiatry, Dr. Keith Rake, w/in 1 week   Home Health: no  Equipment/Devices: wheelchair as pt is nonweightbearing of LLE  Discharge Condition: stable  CODE STATUS: full  Diet recommendation: Heart Healthy / Carb Modified   Brief/Interim Summary: HPI was taken from Dr. Blaine Hamper: [name] is a 66 y.o. 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   Patient states that he has left foot plantar ulcer for more than 2 weeks.  He was treated with Cipro and azithromycin and has been following up with wound care clinic.  His foot ulcer has not improved significantly.  His left foot is mildly swelling, erythematous and painful.  The left foot pain is constant, sharp, 6 out of 10 in severity, nonradiating.  Patient denies fever or chills.  Patient does not have chest pain, cough, shortness breath.  No nausea, vomiting, diarrhea or abdominal pain.  Denies symptoms of UTI.   Data reviewed independently and ED Course: pt was found to have WBC 7.8, lactic acid 1.6, renal function close to baseline, temperature normal, blood pressure 112/78, heart rate 97, RR 16, oxygen saturation 98% on room air.  Patient is admitted to [facility] bed as inpatient.  Dr. Sherryle Lis of podiatry is consulted.  As per Dr. Jimmye Norman 10/19-10/20/23: Pt was found to have osteomyelitis of first, second, third & fourth metatarsal of left foot on admission. Pt was initially started on IV rocephin, flagyl, & vanco. Pt is /sp transmetatarsal amputation on 08/18/22 as  per podiatry. Pt is suppose to be nonweightbearing of LLE and no dressing changes until pt can f/u outpatient w/ podiatry w/in 1 week. Pt was d/c home on po doxycycline x 10 days as per podiatry. For more information, please see previous progress/consult notes.   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 Continue on IV ceftriaxone, flagyl & vanco while inpatient. Will d/c home on po doxycycline x 10 days as per podiatry. Non-weight bearing of LLE. No dressing changes until pt f/u outpatient w/ podiatry in 1 week    DM2: likely poorly controlled, 9.9 in 05/2022. Continue on SSI w/ accuchecks   CKDIIIa: baseline Cr 1.1-1.5. Cr is labile    Depression: severity unknown. Continue on home dose of seroquel, requip    Gout: continue on home dose of allopurinol    Chronic diastolic CHF: echo on 12/02/2977 showed EF of 50-55% with grade 2 diastolic dysfunction. CHF seem to be compensated. Monitor I/Os   Hx of CVA: continue on statin   HLD: continue on statin   Discharge Instructions  Discharge Instructions     Diet -  low sodium heart healthy   Complete by: As directed    Diet Carb Modified   Complete by: As directed    Discharge instructions   Complete by: As directed    F/u w/ podiatry, Dr. Keith Rake, in 1 week. F/u w/ PCP in 1-2 weeks. Non-weight bearing of left lower extremity. No dressing changes until pt can f/u outpatient w/ podiatry in 1 week   Increase activity slowly   Complete by: As  directed    No wound care   Complete by: As directed       Allergies as of 08/19/2022   No Known Allergies      Medication List     TAKE these medications    allopurinol 300 MG tablet Commonly known as: ZYLOPRIM Take 300 mg by mouth daily.   atorvastatin 80 MG tablet Commonly known as: Lipitor Take 1 tablet (80 mg total) by mouth daily.   doxycycline 100 MG EC tablet Commonly known as: DORYX Take 1 tablet (100 mg total) by mouth 2 (two) times daily for 10 days.   empagliflozin 25 MG Tabs tablet Commonly known as: JARDIANCE Take 25 mg by mouth daily.   gabapentin 400 MG capsule Commonly known as: NEURONTIN Take 400 mg by mouth 3 (three) times daily.   lisinopril 5 MG tablet Commonly known as: ZESTRIL Take 5 mg by mouth daily.   metFORMIN 500 MG tablet Commonly known as: GLUCOPHAGE Take 1,000 mg by mouth 2 (two) times daily with a meal.   QUEtiapine 25 MG tablet Commonly known as: SEROQUEL Take 25 mg by mouth at bedtime.   rOPINIRole 0.5 MG tablet Commonly known as: REQUIP Take 0.5 mg by mouth at bedtime.   Semaglutide 14 MG Tabs Take 14 mg by mouth daily.   sildenafil 100 MG tablet Commonly known as: VIAGRA Take 100 mg by mouth daily as needed for erectile dysfunction.   testosterone cypionate 200 MG/ML injection Commonly known as: DEPOTESTOSTERONE CYPIONATE Inject into the muscle every 14 (fourteen) days.               Durable Medical Equipment  (From admission, onward)           Start     Ordered   08/19/22 0954  For home use only DME lightweight manual wheelchair with seat cushion  Once       Comments: Patient suffers from osteomyelitis of left foot which impairs their ability to perform daily activities like bathing, dressing, feeding, grooming, and toileting in the home.  A cane or walker will not resolve  issue with performing activities of daily living. A wheelchair will allow patient to safely perform daily activities. Patient  is not able to propel themselves in the home using a standard weight wheelchair due to general weakness. Patient can self propel in the lightweight wheelchair. Length of need 12 months . Accessories: elevating leg rests (ELRs), wheel locks, extensions and anti-tippers.   08/19/22 0954   08/19/22 0954  For home use only DME Walker  Once       Comments: Gilford Rile w/ a seat  Question:  Patient needs a walker to treat with the following condition  Answer:  Osteomyelitis of left foot ([place])   08/19/22 0954            Follow-up Information     Felipa Furnace, DPM Follow up.   Specialty: Podiatry Why: F/u in 1 week Contact information: [address] [place] [address] [phone]  Fields, Colan Neptune, NP Follow up.   Specialty: Family Medicine Why: F/u in 1-2 weeks Contact information: [place] [address] [phone]                No Known Allergies  Consultations: Podiatry    Procedures/Studies: DG Foot Complete Left  Result Date: 08/18/2022 CLINICAL DATA:  Status post foot surgery. Transmetatarsal amputation of left foot. EXAM: LEFT FOOT - COMPLETE 3+ VIEW COMPARISON:  Left foot radiographs 08/03/2022 FINDINGS: Interval further amputation of the great toe metatarsal and new amputation of the second through fifth metatarsals, although the proximal metatarsal shafts. The amputation sites appear sharp. There are surgical skin staples within the more distal amputation soft tissues. Expected postoperative forefoot subcutaneous air is seen. Large plantar calcaneal heel spur. Moderate dorsal midfoot degenerative osteophytes. IMPRESSION: Interval further amputation of the great toe metatarsal and new amputation of the second through fifth metatarsals. No unexpected changes. Electronically Signed   By: Yvonne Kendall M.D.   On: 08/18/2022 14:22   DG MINI C-ARM IMAGE ONLY  Result Date: 08/18/2022 There is no interpretation for this exam.   This order is for images obtained during a surgical procedure.  Please See "Surgeries" Tab for more information regarding the procedure.   MR FOOT LEFT W WO CONTRAST  Result Date: 08/17/2022 CLINICAL DATA:  Diabetic foot pain EXAM: MRI OF THE LEFT FOREFOOT WITHOUT AND WITH CONTRAST TECHNIQUE: Multiplanar, multisequence MR imaging of the left forefoot was performed both before and after administration of intravenous contrast. CONTRAST:  57mL GADAVIST GADOBUTROL 1 MMOL/ML IV SOLN COMPARISON:  X-ray 08/03/2022 FINDINGS: Bones/Joint/Cartilage Postsurgical changes from transmetatarsal amputation of the first ray at the level of the mid diaphysis. There is acute osteomyelitis involving the distal 1.5 cm of the residual first metatarsal shaft. Acute osteomyelitis of the second metatarsal extending from the level of the second metatarsal head to the proximal metaphysis. Acute osteomyelitis of the second toe proximal phalanx. Dorsal dislocation of the second MTP joint with complex second MTP joint effusion compatible with septic arthritis. Acute osteomyelitis of the third metatarsal extending from the metatarsal head to the mid diaphysis. Pathologic fracture of the third metatarsal neck. Acute osteomyelitis of the third toe proximal phalanx. Mild bone marrow edema within the fourth toe proximal phalanx may be reactive or secondary to early osteomyelitis. Bones of the midfoot demonstrate degenerative findings with mild reactive subchondral marrow edema. No findings to suggest osteomyelitis or septic arthritis within the midfoot. Ligaments Intact Lisfranc ligament. Disruption of the second MTP joint capsule. Muscles and Tendons Denervation changes of the foot musculature with findings of myositis. Tenosynovitis of the second toe flexor tendon. Soft tissues Complex abscess centered at the second MTP joint contiguous with a plantar ulceration. Collection measures approximately 4.1 x 2.2 x 4.0 cm. Extensive changes of  cellulitis throughout the distal forefoot. IMPRESSION: 1. Acute osteomyelitis of the first, second, and third metatarsals as well as the proximal phalanxes of the second and third toes. Reactive osteitis or early osteomyelitis of the fourth toe proximal phalanx. 2. Complex abscess centered at the second MTP joint contiguous with a plantar ulceration. Collection measures approximately 4.1 x 2.2 x 4.0 cm. 3. Dorsal dislocation of the second MTP joint with complex second MTP joint effusion compatible with septic arthritis. 4. Tenosynovitis of the second toe flexor tendon. Electronically Signed   By: Davina Poke D.O.   On: 08/17/2022 13:49   DG Foot Complete Left  Result Date: 08/05/2022 CLINICAL DATA:  Osteomyelitis, diabetic ulcer EXAM: LEFT FOOT - COMPLETE 3+ VIEW COMPARISON:  12/15/2021 FINDINGS: Status post interval transmetatarsal amputation of the left great toe. Subluxation of the left second metatarsophalangeal joint. Subacute to chronic appearing, callused fracture deformity of the left third metatarsal neck. Diffuse soft tissue edema about the forefoot with plantar wound over the metatarsal heads. Mild midfoot arthrosis. IMPRESSION: 1. Status post interval transmetatarsal amputation of the left great toe. 2. Subluxation of the left second metatarsophalangeal joint. 3. New, although subacute to chronic appearing, callused fracture deformity of the left third metatarsal neck. 4. Diffuse soft tissue edema about the forefoot with plantar wound over the metatarsal heads. No overt bony erosion or sclerosis to suggest osteomyelitis. MRI is the test of choice to evaluate for bone marrow edema and osteomyelitis if clinically suspected. Electronically Signed   By: Delanna Ahmadi M.D.   On: 08/05/2022 09:31   (Echo, Carotid, EGD, Colonoscopy, ERCP)    Subjective: Pt c/o intermittent nerve pain of LLE.    Discharge Exam: Vitals:   08/18/22 1953 08/19/22 0754  BP: 124/71 113/70  Pulse: 78 85  Resp: 20  16  Temp: 98.1 F (36.7 C) 99 F (37.2 C)  SpO2: 97% 96%   Vitals:   08/18/22 1319 08/18/22 1649 08/18/22 1953 08/19/22 0754  BP: (!) 105/57 122/75 124/71 113/70  Pulse: 66 76 78 85  Resp: 16 17 20 16   Temp: 97.7 F (36.5 C) 98.4 F (36.9 C) 98.1 F (36.7 C) 99 F (37.2 C)  TempSrc:      SpO2: 94% 96% 97% 96%  Weight:      Height:        General: Pt is alert, awake, not in acute distress Cardiovascular:  S1/S2 +, no rubs, no gallops Respiratory: CTA bilaterally, no wheezing, no rhonchi Abdominal: Soft, NT, ND, bowel sounds + Extremities:  LLE is dressed & dressing is C/D/I.     The results of significant diagnostics from this hospitalization (including imaging, microbiology, ancillary and laboratory) are listed below for reference.     Microbiology: Recent Results (from the past 240 hour(s))  Culture, blood (Routine X 2) w Reflex to ID Panel     Status: None (Preliminary result)   Collection Time: 08/17/22  9:36 AM   Specimen: BLOOD  Result Value Ref Range Status   Specimen Description BLOOD RIGHT ANTECUBITAL  Final   Special Requests   Final    BOTTLES DRAWN AEROBIC AND ANAEROBIC Blood Culture adequate volume   Culture   Final    NO GROWTH 2 DAYS Performed at [facility], [address], [place], [place] [address]    Report Status PENDING  Incomplete  Culture, blood (Routine X 2) w Reflex to ID Panel     Status: None (Preliminary result)   Collection Time: 08/17/22  2:19 PM   Specimen: BLOOD  Result Value Ref Range Status   Specimen Description BLOOD RIGHT ANTECUBITAL  Final   Special Requests   Final    BOTTLES DRAWN AEROBIC AND ANAEROBIC Blood Culture adequate volume   Culture   Final    NO GROWTH 2 DAYS Performed at [facility], [address] [place], [place] [address]    Report Status PENDING  Incomplete     Labs: BNP (last 3 results) Recent Labs    08/17/22 0936  BNP [id number]   Basic Metabolic Panel: Recent Labs  Lab  08/17/22 0936 08/18/22 0434 08/19/22 0353  NA 139 137 135  K 4.5  3.8 4.1  CL 103 105 100  CO2 27 25 25   GLUCOSE 149* 149* 166*  BUN 24* 24* 26*  CREATININE 1.42* 1.23 1.18  CALCIUM 9.9 9.0 8.8*   Liver Function Tests: Recent Labs  Lab 08/17/22 0936  AST 14*  ALT 13  ALKPHOS 77  BILITOT 0.7  PROT 8.5*  ALBUMIN 3.9   No results for input(s): "LIPASE", "AMYLASE" in the last 168 hours. No results for input(s): "AMMONIA" in the last 168 hours. CBC: Recent Labs  Lab 08/17/22 0936 08/18/22 0434 08/18/22 1847 08/19/22 0353  WBC 7.8 6.2  --  8.7  NEUTROABS 5.2  --   --   --   HGB 13.3 11.9* 12.4* 12.0*  HCT 42.8 36.8* 39.1 36.9*  MCV 86.1 84.8  --  82.7  PLT 313 270  --  321   Cardiac Enzymes: No results for input(s): "CKTOTAL", "CKMB", "CKMBINDEX", "TROPONINI" in the last 168 hours. BNP: Invalid input(s): "POCBNP" CBG: Recent Labs  Lab 08/18/22 1321 08/18/22 1748 08/18/22 2027 08/19/22 0743 08/19/22 1145  GLUCAP 160* 268* 240* 171* 154*   D-Dimer No results for input(s): "DDIMER" in the last 72 hours. Hgb A1c Recent Labs    08/19/22 0353  HGBA1C 7.0*   Lipid Profile No results for input(s): "CHOL", "HDL", "LDLCALC", "TRIG", "CHOLHDL", "LDLDIRECT" in the last 72 hours. Thyroid function studies No results for input(s): "TSH", "T4TOTAL", "T3FREE", "THYROIDAB" in the last 72 hours.  Invalid input(s): "FREET3" Anemia work up No results for input(s): "VITAMINB12", "FOLATE", "FERRITIN", "TIBC", "IRON", "RETICCTPCT" in the last 72 hours. Urinalysis    Component Value Date/Time   COLORURINE YELLOW (A) 12/16/2021 1110   APPEARANCEUR CLEAR (A) 12/16/2021 1110   LABSPEC 1.021 12/16/2021 1110   PHURINE 5.0 12/16/2021 1110   GLUCOSEU >=500 (A) 12/16/2021 1110   HGBUR NEGATIVE 12/16/2021 1110   BILIRUBINUR NEGATIVE 12/16/2021 1110   KETONESUR NEGATIVE 12/16/2021 1110   PROTEINUR NEGATIVE 12/16/2021 1110   NITRITE NEGATIVE 12/16/2021 1110   LEUKOCYTESUR  NEGATIVE 12/16/2021 1110   Sepsis Labs Recent Labs  Lab 08/17/22 0936 08/18/22 0434 08/19/22 0353  WBC 7.8 6.2 8.7   Microbiology Recent Results (from the past 240 hour(s))  Culture, blood (Routine X 2) w Reflex to ID Panel     Status: None (Preliminary result)   Collection Time: 08/17/22  9:36 AM   Specimen: BLOOD  Result Value Ref Range Status   Specimen Description BLOOD RIGHT ANTECUBITAL  Final   Special Requests   Final    BOTTLES DRAWN AEROBIC AND ANAEROBIC Blood Culture adequate volume   Culture   Final    NO GROWTH 2 DAYS Performed at [facility], [address] [place], [place] [address]    Report Status PENDING  Incomplete  Culture, blood (Routine X 2) w Reflex to ID Panel     Status: None (Preliminary result)   Collection Time: 08/17/22  2:19 PM   Specimen: BLOOD  Result Value Ref Range Status   Specimen Description BLOOD RIGHT ANTECUBITAL  Final   Special Requests   Final    BOTTLES DRAWN AEROBIC AND ANAEROBIC Blood Culture adequate volume   Culture   Final    NO GROWTH 2 DAYS Performed at [facility], [address] [place], [place] [address]    Report Status PENDING  Incomplete     Time coordinating discharge: Over 30 minutes  SIGNED:   Wyvonnia Dusky, MD  [facility] 08/19/2022, 1:01 PM Pager   If  7PM-7AM, please contact night-coverage [email or web address]

## 2022-08-22 LAB — CULTURE, BLOOD (ROUTINE X 2)
Culture: NO GROWTH
Culture: NO GROWTH
Special Requests: ADEQUATE
Special Requests: ADEQUATE

## 2022-08-22 LAB — SURGICAL PATHOLOGY

## 2022-08-24 ENCOUNTER — Encounter: Payer: Medicare Other | Admitting: Internal Medicine

## 2022-08-24 DIAGNOSIS — M79672 Pain in left foot: Secondary | ICD-10-CM | POA: Diagnosis not present

## 2022-08-24 DIAGNOSIS — L97512 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ed: Secondary | ICD-10-CM | POA: Diagnosis not present

## 2022-08-24 DIAGNOSIS — X58XXXA Exposure to other specified factors, initial encounter: Secondary | ICD-10-CM | POA: Diagnosis not present

## 2022-08-24 DIAGNOSIS — R2689 Other abnormalities of gait and mobility: Secondary | ICD-10-CM | POA: Diagnosis not present

## 2022-08-24 DIAGNOSIS — S91302A Unspecified open wound, left foot, initial encounter: Secondary | ICD-10-CM | POA: Diagnosis not present

## 2022-08-24 DIAGNOSIS — E1165 Type 2 diabetes mellitus with hyperglycemia: Secondary | ICD-10-CM | POA: Diagnosis not present

## 2022-08-24 DIAGNOSIS — E1142 Type 2 diabetes mellitus with diabetic polyneuropathy: Secondary | ICD-10-CM | POA: Diagnosis not present

## 2022-08-24 DIAGNOSIS — Z89412 Acquired absence of left great toe: Secondary | ICD-10-CM | POA: Diagnosis not present

## 2022-08-24 DIAGNOSIS — E11621 Type 2 diabetes mellitus with foot ulcer: Secondary | ICD-10-CM | POA: Diagnosis present

## 2022-08-25 NOTE — Progress Notes (Signed)
PAX, MONNIER ([id number]) [id number] Page 1 of 9 Visit Report for 08/24/2022 Cellular or Tissue Based Product Details Patient Name: Date of Service: [name] [name], [name] [name] 08/24/2022 8:15 A M Medical Record Number: [id number] Patient Account Number: [id number] Date of Birth/Sex: Treating RN: 26-Jan-1956 (66 y.o. [name] [name] Primary Care Provider: Lang [name] Other Clinician: Massie [name] Referring Provider: Treating Provider/Extender: RO BSO N, [name] [name] [name] [name] in Treatment: 55 Cellular or Tissue Based Product Type Wound #1 Right,Plantar Foot Applied to: Performed By: Physician [name] Dillon, MD Cellular or Tissue Based Product Type: Puraply AM Level of Consciousness (Pre-procedure): Awake and Alert Pre-procedure Verification/Time Out Yes - 08:32 Taken: Location: genitalia / hands / feet / multiple digits Wound Size (sq cm): 0.42 Product Size (sq cm): 4 Waste Size (sq cm): 1 Waste Reason: wound size Amount of Product Applied (sq cm): 3 Instrument Used: Forceps, Scissors Lot #: [id number] Order #: 5 Expiration Date: 11/16/2024 Fenestrated: No Reconstituted: No Secured: Yes Secured With: Steri-Strips Dressing Applied: Yes Primary Dressing: bolster Response to Treatment: Procedure was tolerated well Level of Consciousness (Post- Awake and Alert procedure): Post Procedure Diagnosis Same as Pre-procedure Electronic Signature(s) Signed: 08/25/2022 7:57:14 AM By: [name] [name] Entered By: [name] [name] on 08/24/2022 08:35:31 -------------------------------------------------------------------------------- HPI Details Patient Name: Date of Service: [name] [name] 08/24/2022 8:15 A [name] [name], [name] [name] ([id number]) [id number] Page 2 of 9 Medical Record Number: [id number] Patient Account Number: [id number] Date of Birth/Sex: Treating RN: Mar 09, 1956 (66 y.o. [name] [name] Primary Care Provider:  Lang [name] Other Clinician: Massie [name] Referring Provider: Treating Provider/Extender: RO BSO N, [name] [name] [name] [name], [name] [name] [name] in Treatment: 41 History of Present Illness HPI Description: Admission 11/10/2021 Mr. [name] [name] is a 66 year old male with a past medical history of uncontrolled type 2 diabetes with last hemoglobin 123456 of 8.1 complicated by peripheral neuropathy that presents to the clinic for a 2-year history of nonhealing ulcer to the bottom of his right foot. He states this started out as a blister caused by a work boot. He reports receiving wound care when he resided in [place]. He is reestablishing his wound care in [facility] today. He is currently keeping the area clean and covered. He has insoles designed to help offload the wound bed. He currently denies signs of infection. 1/18; patient presents for follow-up. He has been using Hydrofera Blue to the wound bed. He has no issues or complaints today. He has not received the defender.[name] He denies signs of infection. 2/1; patient presents for follow-up. He has been using Hydrofera Blue to the wound bed he states he received the defender boot and has been using it however he did not have it on today. He currently denies signs of infection to the right foot. Unfortunately he developed a wound to the left great toe over the past week. He states he received new orthotics and these caused a blister to the left great toe which turned into a wound. He has not been doing anything to the wound bed. He currently denies systemic signs of infection. 2/8; Patient presents for follow-up. 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 He states he is about 90% recovered. He has some mild weakness to the left side. He reports taking the antibiotics prescribed at last clinic visit. He reports using Hydrofera Blue  for dressing changes. He only uses the offloading  boot when he is at home. He uses open toed shoes to the right foot. He currently denies signs of infection. 2/15; patient presents for follow-up. He states he is still taking the antibiotics prescribed. He reports using the defender boot to the right foot and open toed shoe to the left foot however he is not wearing either today. He currently denies systemic signs of infection. He stated he cut down a tree last week and was outside doing yard work. 2/22; patient presents for follow-up. 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 He reports feeling well. He reports using the defender boot to the right leg and continues to use Hydrofera Blue with dressing changes. He denies systemic signs of infection. 3/8; patient presents for follow-up. He sees Dr. Alberteen [name] tomorrow for follow-up of the left great toe amputation. This was a partial amputation. He reports he is taking Augmentin currently. He reports increased erythema to the left great toe amputation site. He also reports a decline in his right plantar foot wound. He reports it is draining more. He denies purulent drainage. 3/15; patient presents for follow-up. He did not see Dr. Alberteen [name] last week but states he sees him today. He states he has been taking doxycycline in the past week and started gentamicin ointment. [facility] dressing changes. He currently denies systemic signs of infection. 3/22; patient presents for follow-up. Patient had partial amputation of the left great toe on 2/15. 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 Alberteen [name]. He reports no issues and has no complaints. He is currently taking Keflex and doxycycline prescribed by Dr. Alberteen [name]. He continues to use gentamicin ointment and Hydrofera Blue to the right foot wound. He reports using the defender boot when he is at home. 3/29; patient presents for follow-up.  He continues to be on antibiotics per podiatry. He has been using Hydrofera Blue and gentamicin ointment to the right plantar foot wound. He has no issues or complaints today. He denies signs of infection. 4/5; patient presents for follow-up. He has been using Dakin's wet-to-dry dressings with improvement to wound healing. He denies signs of infection. She has no issues or complaints today. 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 Use Hydrofera Blue for a prolonged period of time and has been using Dakin's wet-to-dry I think for the last 2 to 3 [name]. He has a Psychologist, forensicdefender boot at home but he does not wear that coming into the clinic because he drives. He tells me he has been compliant with the defender boot short of when he goes out to drive. He lives alone. He also tells me he has had a previous left great toe amputation We are following him for the right foot wound. 4/26; patient presents for follow-up. He had worsening of his left foot wound and had surgical debridement on 4/16 by Dr. Fanny SkatesSutherland. He is being followed by podiatry for this issue. He he is on IV Unasyn based on tissue culture by ID.[name]. We are following him for the right foot wound. He has been using Dakin's wet-to- dry dressings without issues. He reports continued usage of the defender boot. 5/3; patient presents for follow-up. He has been using Dakin's wet-to-dry dressings to the right foot wound. He has no issues or complaints today. He reports using the Psychologist, forensicdefender boot. He reports offloading the foot wound when he is at home by sitting in a recliner. He denies  signs of infection. He is still getting IV antibiotics For the left foot wound that is being followed by podiatry. He states the end date is later this month on 5/28. 5/17; patient presents for follow-up. He missed his last clinic appointment. He has been using Dakin's wet-to-dry dressings. He states he is offloading the right foot wound with the defender  boot although he does not have this today. He is still on IV antibiotics. He has no issues or complaints today. 5/24; patient presents for follow-up. He has been using Hydrofera Blue to the wound bed without issues. He denies signs of infection. At last clinic visit he had a wound culture done that detected high levels of Enterococcus faecalis and low levels of Corynebacterium stratum and Staphylococcus epidermidis. [facility]stone antibiotics were ordered. He has not heard from the company yet. 5/31; patient presents for follow-up. He continues to use Hydrofera Blue to the wound bed. He denies signs of infection. Keystone antibiotics were ordered at last clinic visit and he states he called the company to pay for it. He states that it is being shipped. We rediscussed the total contact cast and he declines having this placed today. He states he is wearing the defender boot however he never wears it into the office. 04-06-2022 upon evaluation today patient appears to be doing about the same in regard to his wound. This is not significantly smaller. He is not wearing his offloading boot. We discussed that today he tells me that he wears it "all the time as he chuckled and does not have it on during the office visit today. I think it is increasingly obvious that he is just messing around when he says this based on what I am seeing and otherwise I do not know why he would wear it all the time but not to the clinic. Either way my opinion based on what I am seeing currently is simply that the wound does seem to be still having quite a bit of callus buildup around the edges it is also very dry. He does tell me that he has been using the [facility] since he got it and to be honest I think this is good and should hopefully help keep things under control but at the same time I do not see any evidence of active infection. Again the [employer] is a topical antibiotic with multiple compounds to help prevent further infection and  worsening overall. 6/14; patient presents for follow-up. He has been using Keystone antibiotics with [employer] daily. He states he is wearing his offloading boot. He does not have this in office today. He is going to the beach for the next 5 days. He states he Will not be using using any offloading device During this time. I again offered the total contact cast however he declined. I did ask him to reconsider for next clinic visit. He currently denies signs of infection. 6/21; patient presents for follow-up. He went to the beach last week. He did not use an offloading device. Wound is slightly deeper today. I again offered the [place], [name] [name] ([id number]) [id number] Page 3 of 9 total contact cast but he declined. Currently denies signs of infection. He reports using the [place], hydrofera blue and the Conservation officer, nature. 6/28; patient presents for follow-up. Patient declines total contact cast today. He has been using Keystone and [facility] to the wound bed. It is unclear if he is using the Conservation officer, nature. He would like a letter for the post man to  delivers mail to his front door so he does not have to walk down the driveway. 7/5; patient presents for follow-up. He states he is considering the total contact cast however does not want this placed today. He is actually going to the gym after this appointment. He does not want offloading foot wear for this. He has been using Keystone and [facility] to the wound bed. He denies signs of infection. 7/12; patient presents for follow-up. He is still considering the total contact cast however does not want this today. He has been using Keystone antibiotic and Hydrofera Blue to the wound bed. He denies signs of infection. He has no issues or complaints today. 7/26; patient presents for follow-up. He declines a total contact cast. He has been using his an antibiotic and Hydrofera Blue to the wound bed. He denies systemic signs  of infection. He is scheduled to discuss orthotics next week with his podiatrist. 8/2; patient presents for follow-up. He has been using Hydrofera Blue to the wound bed. He has no issues or complaints today. He declines a total contact cast. He states he is getting orthotics today. 8/9; patient presents for follow-up. He has been using Hydrofera Blue to the wound bed. He again declines the total contact cast. He does state that he is considering starting it at the end of the month. He has a vacation coming up and does not want to have it in place for that. 8/16; patient presents for follow-up. He has been using Hydrofera Blue and Keystone to the wound bed. He started taking Augmentin and doxycycline as prescribed. He denies signs of infection. He is going on his vacation next week and will be back in 2 [name]. He states he is considering the total contact cast for when he comes back from his trip. 8/30; patient present for follow-up. He has been using Hydrofera Blue and Keystone to the wound bed. He continues to take doxycycline and Augmentin without issues. He went on vacation and reports walking for long periods of time without pressure relief to the wound bed. He currently denies systemic signs of infection. 9/6; patient presents for follow-up. He has been using Hydrofera Blue and Keystone to the wound bed. He is not wearing his offloading boot. He declines a total contact cast. He has finished Augmentin and doxycycline. He had an MRI completed on 07/01/2022 that did not show an underlying abscess, septic arthritis or osteomyelitis. He currently denies systemic signs of infection. 9/13; patient presents for follow-up. Has been using Hydrofera Blue and Keystone to the wound bed. He does not present with his offloading boot. He declines the total contact cast. He has been approved for PuraPly. We discussed this today and he would like this placed at next clinic visit. He denies signs  of infection. 9/20; patient presents for follow-up. We have been using Hydrofera Blue and Keystone antibiotic ointment. Patient denies signs of infection. PuraPly is available for placement and patient would like to proceed with this. 9/27; patient presents for follow-up. PuraPly #1 was placed in standard fashion to the right foot at last clinic visit. He tolerated this well. Unfortunately has developed a wound to the plantar surface of the left foot. He states he noticed this Sunday. He has pain to the site. He denies purulent drainage increased warmth or erythema. 10/4; patient presents for follow-up. PuraPly #2 was placed in standard fashion to the right foot at last clinic visit. He started taking Augmentin and doxycycline prescribed at last clinic  visit due to findings of infection to the left foot. He has been doing Dakin's wet-to-dry to this area. He reports stability in his symptoms. He denies systemic signs of infection. 10/11; patient presents for follow-up. PuraPly #3 was placed in standard fashion to the right foot at last clinic visit. Unfortunately the clinic did not order another PuraPly for this week. He continues to take Augmentin and doxycycline. He still has pain in the left foot. He had an x-ray that did not show radiographic evidence of osteomyelitis. He still has serous drainage on exam. He is doing Dakin's wet-to-dry dressings to this area. 10/18; patient presents for follow-up. He has been using Hydrofera Blue and Keystone to the right foot. PuraPly is available for placement today. He has been taking levofloxacin for the past week. He continues to have pain in the left foot with increased redness and swelling. An MRI was ordered at last clinic visit this has not been done yet. He has been doing Dakin's wet-to-dry dressings to the left foot. 10/25; since the patient was last here he was admitted to hospital from 1018 through 10/20. He underwent a transmetatarsal amputation by  Dr. Posey Pronto of podiatry. He received vancomycin and Rocephin in the hospital and was discharged on 10 days of doxycycline. He has a follow-up with Dr. Posey Pronto this week on Friday he is not changing the dressing. The wound we are following is on the plantar right foot between the first and second metatarsals. We applied puraply #5 today Electronic Signature(s) Signed: 08/24/2022 4:38:50 PM By: Linton Ham MD Entered By: Linton Ham on 08/24/2022 08:38:43 -------------------------------------------------------------------------------- Physical Exam Details Patient Name: Date of Service: [name] [name] 08/24/2022 8:15 A M Medical Record Number: [id number] Patient Account Number: [id number] Date of Birth/Sex: Treating RN: 10/26/1956 (66 y.o. [name] [name] Primary Care Provider: Lang [name] Other Clinician: Massie [name] Rule, [name] [name] ([id number]) [id number] Page 4 of 9 Referring Provider: Treating Provider/Extender: RO BSO N, [name] [name] G Fields, [name] [name] [name] in Treatment: 41 Constitutional Sitting or standing Blood Pressure is within target range for patient.. Pulse regular and within target range for patient.[name] Respirations regular, non-labored and within target range.. Temperature is normal and within the target range for the patient.[name] appears in no distress. Notes Wound exam; right foot the wound itself has healthy granulation slight undermining from roughly 9-12 o'clock but no probable depth no surrounding erythema Puraply #5 applied in the standard fashion Electronic Signature(s) Signed: 08/24/2022 4:38:50 PM By: Linton Ham MD Entered By: Linton Ham on 08/24/2022 08:39:22 -------------------------------------------------------------------------------- Physician Orders Details Patient Name: Date of Service: [name] [name] 08/24/2022 8:15 A M Medical Record Number: [id number] Patient Account Number: [id number] Date of Birth/Sex: Treating  RN: [birth year] (66 y.o. [name] [name] Primary Care Provider: Lang [name] Other Clinician: Massie [name] Referring Provider: Treating Provider/Extender: RO BSO N, [name] [name] [name] [name], [name] [name] [name] in Treatment: 47 Verbal / Phone Orders: No Diagnosis Coding ICD-10 Coding Code Description L97.512 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ed E11.621 Type 2 diabetes mellitus with foot ulcer E11.40 Type 2 diabetes mellitus with diabetic neuropathy, unspecified R26.89 Other abnormalities of gait and mobility Follow-up Appointments Return Appointment in 1 week. Nurse Visit as needed Bathing/ Shower/ Hygiene May shower; gently cleanse wound with antibacterial soap, rinse and pat dry prior to dressing wounds No tub bath. Anesthetic (Use 'Patient Medications' Section for Anesthetic Order Entry) Lidocaine applied to wound bed Cellular or Tissue Based Products Cellular or Tissue Based Product  Type: - Puraply application #5 applied right foot Edema Control - Lymphedema / Segmental Compressive Device / Other Elevate, Exercise Daily and A void Standing for Long Periods of Time. Elevate leg(s) parallel to the floor when sitting. DO YOUR BEST to sleep in the bed at night. DO NOT sleep in your recliner. Long hours of sitting in a recliner leads to swelling of the legs and/or potential wounds on your backside. Off-Loading Foot Defender - right foot-keep shin covered-keep pressure off of the wound (Patient is not wear boot to clinic each week. Wearing tennis shoe) Additional Orders / Instructions Follow Nutritious Diet and Increase Protein Intake - monitor blood sugar to maintain normal limits Other: - Follow up post surgery for left foot-[facility] handling [name] [name], [name] [name] ([id number]) [id number] Page 5 of 9 Wound Treatment Wound #1 - Foot Wound Laterality: Plantar, Right Cleanser: Wound Cleanser Every Other Day/30 Days Discharge  Instructions: Wash your hands with soap and water. Remove old dressing, discard into plastic bag and place into trash. Cleanse the wound with Wound Cleanser prior to applying a clean dressing using gauze sponges, not tissues or cotton balls. Do not scrub or use excessive force. Pat dry using gauze sponges, not tissue or cotton balls. Prim Dressing: PuraPly Every Other Day/30 Days ary Discharge Instructions: #3 Secondary Dressing: Zetuvit Plus 4x4 (in/in) (Generic) Every Other Day/30 Days Secured With: Conform 4'' - Conforming Stretch Gauze Bandage 4x75 (in/in) Every Other Day/30 Days Discharge Instructions: Apply as directed Electronic Signature(s) Signed: 08/24/2022 4:38:50 PM By: Linton Ham MD Signed: 08/25/2022 7:57:14 AM By: [name] [name] Entered By: [name] [name] on 08/24/2022 08:49:18 -------------------------------------------------------------------------------- Problem List Details Patient Name: Date of Service: [name] [name] 08/24/2022 8:15 A M Medical Record Number: [id number] Patient Account Number: [id number] Date of Birth/Sex: Treating RN: [birth year] (66 y.o. [name] [name] Primary Care Provider: Lang [name] Other Clinician: Massie [name] Referring Provider: Treating Provider/Extender: RO BSO N, [name] [name] [name] [name], [name] [name] [name] in Treatment: 68 Active Problems ICD-10 Encounter Code Description Active Date MDM Diagnosis L97.512 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ed 11/10/2021 No Yes E11.621 Type 2 diabetes mellitus with foot ulcer 11/10/2021 No Yes E11.40 Type 2 diabetes mellitus with diabetic neuropathy, unspecified 11/10/2021 No Yes R26.89 Other abnormalities of gait and mobility 11/10/2021 No Yes Inactive Problems ICD-10 Code Description Active Date Inactive Date L97.528 Non-pressure chronic ulcer of other part of left foot with other specified severity 12/01/2021 12/01/2021 S91.302A Unspecified open wound, left foot,  initial encounter 07/27/2022 07/27/2022 [name] [name] ([id number]) [id number] Page 6 of 9 M79.672 Pain in left foot 08/10/2022 08/10/2022 Resolved Problems Electronic Signature(s) Signed: 08/24/2022 4:38:50 PM By: Linton Ham MD Entered By: Linton Ham on 08/24/2022 08:35:01 -------------------------------------------------------------------------------- Progress Note Details Patient Name: Date of Service: [name] [name] 08/24/2022 8:15 A M Medical Record Number: [id number] Patient Account Number: [id number] Date of Birth/Sex: Treating RN: [birth year] (66 y.o. [name] [name] Primary Care Provider: Lang [name] Other Clinician: Massie [name] Referring Provider: Treating Provider/Extender: RO BSO N, [name] [name] [name] [name], [name] [name] [name] in Treatment: 41 Subjective History of Present Illness (HPI) Admission 11/10/2021 Mr. Susana Aguillar is a 66 year old male with a past medical history of uncontrolled type 2 diabetes with last hemoglobin 123456 of 8.1 complicated by peripheral neuropathy that presents to the clinic for a 2-year history of nonhealing ulcer to the bottom of his right foot. He states this started out as a blister caused by a work boot. He reports receiving wound care  when he resided in [place]. He is reestablishing his wound care in [facility] today. He is currently keeping the area clean and covered. He has insoles designed to help offload the wound bed. He currently denies signs of infection. 1/18; patient presents for follow-up. He has been using Hydrofera Blue to the wound bed. He has no issues or complaints today. He has not received the defender.[name] He denies signs of infection. 2/1; patient presents for follow-up. He has been using Hydrofera Blue to the wound bed he states he received the defender boot and has been using it however he did not have it on today. He currently denies signs of infection to the right foot. Unfortunately  he developed a wound to the left great toe over the past week. He states he received new orthotics and these caused a blister to the left great toe which turned into a wound. He has not been doing anything to the wound bed. He currently denies systemic signs of infection. 2/8; Patient presents for follow-up. 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 He states he is about 90% recovered. He has some mild weakness to the left side. He reports taking the antibiotics prescribed at last clinic visit. He reports using Hydrofera Blue for dressing changes. He only uses the offloading boot when he is at home. He uses open toed shoes to the right foot. He currently denies signs of infection. 2/15; patient presents for follow-up. He states he is still taking the antibiotics prescribed. He reports using the defender boot to the right foot and open toed shoe to the left foot however he is not wearing either today. He currently denies systemic signs of infection. He stated he cut down a tree last week and was outside doing yard work. 2/22; patient presents for follow-up. 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 He reports feeling well. He reports using the defender boot to the right leg and continues to use Hydrofera Blue with dressing changes. He denies systemic signs of infection. 3/8; patient presents for follow-up. He sees Dr. Cleda Mccreedy tomorrow for follow-up of the left great toe amputation. This was a partial amputation. He reports he is taking Augmentin currently. He reports increased erythema to the left great toe amputation site. He also reports a decline in his right plantar foot wound. He reports it is draining more. He denies purulent drainage. 3/15; patient presents for follow-up. He did not see Dr. Cleda Mccreedy last week but states he sees him today. He states he has  been taking doxycycline in the past week and started gentamicin ointment. He continues to use [facility] with dressing changes. He currently denies systemic signs of infection. 3/22; patient presents for follow-up. Patient had partial amputation of the left great toe on 2/15. 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 Cleda Mccreedy. He reports no issues and has no complaints. He is currently taking Keflex and doxycycline prescribed by Dr. Cleda Mccreedy. He continues to use gentamicin ointment and Hydrofera Blue to the right foot wound. He reports using the defender boot when he is at home. 3/29; patient presents for follow-up. He continues to be on antibiotics per podiatry. He has been using Hydrofera Blue and gentamicin ointment to the right plantar foot wound. He has no issues or complaints today. He denies signs of infection.  4/5; patient presents for follow-up. He has been using Dakin's wet-to-dry dressings with improvement to wound healing. He denies signs of infection. She has no issues or complaints today. 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 Use Hydrofera Blue for a prolonged period of time and has been using Dakin's wet-to-dry I think Juenger, Crystal ([id number]) [id number] Page 7 of 9 for the last 2 to 3 [name]. He has a Conservation officer, nature at home but he does not wear that coming into the clinic because he drives. He tells me he has been compliant with the defender boot short of when he goes out to drive. He lives alone. He also tells me he has had a previous left great toe amputation We are following him for the right foot wound. 4/26; patient presents for follow-up. He had worsening of his left foot wound and had surgical debridement on 4/16 by Dr. Gari Crown. He is being followed by podiatry for this issue. He he is on IV Unasyn based on tissue culture by ID.[name] We are following him for the right foot wound. He has been using  Dakin's wet-to- dry dressings without issues. He reports continued usage of the defender boot. 5/3; patient presents for follow-up. He has been using Dakin's wet-to-dry dressings to the right foot wound. He has no issues or complaints today. He reports using the Conservation officer, nature. He reports offloading the foot wound when he is at home by sitting in a recliner. He denies signs of infection. He is still getting IV antibiotics For the left foot wound that is being followed by podiatry. He states the end date is later this month on 5/28. 5/17; patient presents for follow-up. He missed his last clinic appointment. He has been using Dakin's wet-to-dry dressings. He states he is offloading the right foot wound with the defender boot although he does not have this today. He is still on IV antibiotics. He has no issues or complaints today. 5/24; patient presents for follow-up. He has been using Hydrofera Blue to the wound bed without issues. He denies signs of infection. At last clinic visit he had a wound culture done that detected high levels of Enterococcus faecalis and low levels of Corynebacterium stratum and Staphylococcus epidermidis. [facility] antibiotics were ordered. He has not heard from the company yet. 5/31; patient presents for follow-up. He continues to use Hydrofera Blue to the wound bed. He denies signs of infection. [facility] antibiotics were ordered at last clinic visit and he states he called the company to pay for it. He states that it is being shipped. We rediscussed the total contact cast and he declines having this placed today. He states he is wearing the defender boot however he never wears it into the office. 04-06-2022 upon evaluation today patient appears to be doing about the same in regard to his wound. This is not significantly smaller. He is not wearing his offloading boot. We discussed that today he tells me that he wears it "all the time as he chuckled and does not have it on during  the office visit today. I think it is increasingly obvious that he is just messing around when he says this based on what I am seeing and otherwise I do not know why he would wear it all the time but not to the clinic. Either way my opinion based on what I am seeing currently is simply that the wound does seem to be still having quite a bit of callus buildup around the  edges it is also very dry. He does tell me that he has been using the [facility] since he got it and to be honest I think this is good and should hopefully help keep things under control but at the same time I do not see any evidence of active infection. Again the [employer] is a topical antibiotic with multiple compounds to help prevent further infection and worsening overall. 6/14; patient presents for follow-up. He has been using [facility]stone antibiotics with [employer] daily. He states he is wearing his offloading boot. He does not have this in office today. He is going to the beach for the next 5 days. He states he Will not be using using any offloading device During this time. I again offered the total contact cast however he declined. I did ask him to reconsider for next clinic visit. He currently denies signs of infection. 6/21; patient presents for follow-up. He went to the beach last week. He did not use an offloading device. Wound is slightly deeper today. I again offered the total contact cast but he declined. Currently denies signs of infection. He reports using the [place], hydrofera blue and the Conservation officer, nature. 6/28; patient presents for follow-up. Patient declines total contact cast today. He has been using [facility]stone and [facility] to the wound bed. It is unclear if he is using the Conservation officer, nature. He would like a letter for the post man to delivers mail to his front door so he does not have to walk down the driveway. 7/5; patient presents for follow-up. He states he is considering the total contact cast however does not want  this placed today. He is actually going to the gym after this appointment. He does not want offloading foot wear for this. He has been using [facility] and [facility] to the wound bed. He denies signs of infection. 7/12; patient presents for follow-up. He is still considering the total contact cast however does not want this today. He has been using Keystone antibiotic and Hydrofera Blue to the wound bed. He denies signs of infection. He has no issues or complaints today. 7/26; patient presents for follow-up. He declines a total contact cast. He has been using his an antibiotic and Hydrofera Blue to the wound bed. He denies systemic signs of infection. He is scheduled to discuss orthotics next week with his podiatrist. 8/2; patient presents for follow-up. He has been using Hydrofera Blue to the wound bed. He has no issues or complaints today. He declines a total contact cast. He states he is getting orthotics today. 8/9; patient presents for follow-up. He has been using Hydrofera Blue to the wound bed. He again declines the total contact cast. He does state that he is considering starting it at the end of the month. He has a vacation coming up and does not want to have it in place for that. 8/16; patient presents for follow-up. He has been using Hydrofera Blue and [facility] to the wound bed. He started taking Augmentin and doxycycline as prescribed. He denies signs of infection. He is going on his vacation next week and will be back in 2 [name]. He states he is considering the total contact cast for when he comes back from his trip. 8/30; patient present for follow-up. He has been using Hydrofera Blue and [facility] to the wound bed. He continues to take doxycycline and Augmentin without issues. He went on vacation and reports walking for long periods of time without pressure relief to the wound  bed. He currently denies systemic signs of infection. 9/6; patient presents for follow-up. He has been  using Hydrofera Blue and [facility] to the wound bed. He is not wearing his offloading boot. He declines a total contact cast. He has finished Augmentin and doxycycline. He had an MRI completed on 07/01/2022 that did not show an underlying abscess, septic arthritis or osteomyelitis. He currently denies systemic signs of infection. 9/13; patient presents for follow-up. Has been using Hydrofera Blue and [facility] to the wound bed. He does not present with his offloading boot. He declines the total contact cast. He has been approved for PuraPly. We discussed this today and he would like this placed at next clinic visit. He denies signs of infection. 9/20; patient presents for follow-up. We have been using Hydrofera Blue and [facility] antibiotic ointment. Patient denies signs of infection. PuraPly is available for placement and patient would like to proceed with this. 9/27; patient presents for follow-up. PuraPly #1 was placed in standard fashion to the right foot at last clinic visit. He tolerated this well. Unfortunately has developed a wound to the plantar surface of the left foot. He states he noticed this Sunday. He has pain to the site. He denies purulent drainage increased warmth or erythema. 10/4; patient presents for follow-up. PuraPly #2 was placed in standard fashion to the right foot at last clinic visit. He started taking Augmentin and doxycycline prescribed at last clinic visit due to findings of infection to the left foot. He has been doing Dakin's wet-to-dry to this area. He reports stability in his symptoms. He denies systemic signs of infection. 10/11; patient presents for follow-up. PuraPly #3 was placed in standard fashion to the right foot at last clinic visit. Unfortunately the clinic did not order another PuraPly for this week. He continues to take Augmentin and doxycycline. He still has pain in the left foot. He had an x-ray that did not show radiographic evidence of osteomyelitis. He  still has serous drainage on exam. He is doing Dakin's wet-to-dry dressings to this area. 10/18; patient presents for follow-up. He has been using Hydrofera Blue and [facility] to the right foot. PuraPly is available for placement today. He has been taking levofloxacin for the past week. He continues to have pain in the left foot with increased redness and swelling. An MRI was ordered at last clinic visit this has not been done yet. He has been doing Dakin's wet-to-dry dressings to the left foot. [name] [name], [name] [name] ([id number]) [id number] Page 8 of 9 10/25; since the patient was last here he was admitted to hospital from 1018 through 10/20. He underwent a transmetatarsal amputation by Dr. Posey Pronto of podiatry. He received vancomycin and Rocephin in the hospital and was discharged on 10 days of doxycycline. He has a follow-up with Dr. Posey Pronto this week on Friday he is not changing the dressing. The wound we are following is on the plantar right foot between the first and second metatarsals. We applied puraply #5 today Objective Constitutional Sitting or standing Blood Pressure is within target range for patient.. Pulse regular and within target range for patient.[name] Respirations regular, non-labored and within target range.. Temperature is normal and within the target range for the patient.[name] appears in no distress. Vitals Time Taken: 8:19 AM, Height: 74 in, Weight: 226 lbs, BMI: 29, Temperature: 98.1 F, Pulse: 89 bpm, Respiratory Rate: 16 breaths/min, Blood Pressure: 117/78 mmHg. General Notes: Wound exam; right foot the wound itself has healthy granulation slight undermining from roughly 9-12 o'clock but  no probable depth no surrounding erythema Puraply #5 applied in the standard fashion Integumentary (Hair, Skin) Wound #1 status is Open. Original cause of wound was Blister. The date acquired was: 07/15/2020. The wound has been in treatment 41 [name]. The wound is located on the  [place]. The wound measures 0.6cm length x 0.7cm width x 0.2cm depth; 0.33cm^2 area and 0.066cm^3 volume. There is Fat Layer (Subcutaneous Tissue) exposed. There is a medium amount of serosanguineous drainage noted. The wound margin is thickened. There is large (67-100%) red granulation within the wound bed. There is a small (1-33%) amount of necrotic tissue within the wound bed including Adherent Slough. 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 A skin graft procedure using a bioengineered skin substitute/cellular or tissue based product was performed by [name] Dillon, MD with the following instrument(s): Forceps and Scissors. Puraply AM was applied and secured with Steri-Strips. 3 sq cm of product was utilized and 1 sq cm was wasted due to wound size. Post Application, bolster was applied. A Time Out was conducted at 08:32, prior to the start of the procedure. The procedure was tolerated well. Post procedure Diagnosis Wound #1: Same as Pre-Procedure . Plan 1. Puraply #5 applied to the right foot wound which appears to be doing well 2. Patient is status post left TMA by Dr. Posey Pronto. We did not look at this today but the patient does not complain of any particular issues. He is nonweightbearing. He came in in an old running shoe today which was a bit worrisome but I did not push this issue Electronic Signature(s) Signed: 08/24/2022 4:38:50 PM By: Linton Ham MD Entered By: Linton Ham on 08/24/2022 08:40:41 [name] [name] ([id number]) [id number] Page 9 of  9 -------------------------------------------------------------------------------- SuperBill Details Patient Name: Date of Service: [name] [name], [name] [name] 08/24/2022 Medical Record Number: [id number] Patient Account Number: [id number] Date of Birth/Sex: Treating RN: [birth year] (66 y.o. [name] [name] Primary Care Provider: Lang [name] Other Clinician: Massie [name] Referring Provider: Treating Provider/Extender: RO BSO N, [name] [name] [name] [name], [name] [name] [name] in Treatment: 41 Diagnosis Coding ICD-10 Codes Code Description [phone] 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ed E11.621 Type 2 diabetes mellitus with foot ulcer E11.40 Type 2 diabetes mellitus with diabetic neuropathy, unspecified R26.89 Other abnormalities of gait and mobility Facility Procedures : CPT4 Code: [id number] Description: [id number] - SKIN SUB GRAFT FACE/NK/HF/G ICD-10 Diagnosis Description L97.512 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ed E11.621 Type 2 diabetes mellitus with foot ulcer Modifier: Quantity: 1 : CPT4 Code: [id number] Description: [id number] PuraPly Product AM 2X2 (4sq CM) Modifier: Quantity: 4 Physician Procedures : CPT4 Code Description Modifier [id number] - WC PHYS SKIN SUB GRAFT FACE/NK/HF/G ICD-10 Diagnosis Description L97.512 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ed E11.621 Type 2 diabetes mellitus with foot ulcer Quantity: 1 Electronic Signature(s) Signed: 08/24/2022 4:38:50 PM By: Linton Ham MD Signed: 08/25/2022 7:57:14 AM By: [name] [name] Previous Signature: 08/24/2022 8:52:15 AM Version By: Gretta Cool, BSN, RN, CWS, Kim RN, BSN Previous Signature: 08/24/2022 8:51:17 AM Version By: Gretta Cool, BSN, RN, CWS, Kim RN, BSN Entered By: [name] [name] on 08/24/2022 08:52:34

## 2022-08-25 NOTE — Progress Notes (Signed)
SOLOMON, SKOWRONEK ([id number]) [id number] Page 1 of 8 Visit Report for 08/24/2022 Arrival Information Details Patient Name: Date of Service: [place], DUCRE EL 08/24/2022 8:15 A M Medical Record Number: [id number] Patient Account Number: [id number] Date of Birth/Sex: Treating RN: [birth year] (66 y.o. [name] Primary Care Ren Aspinall: [name] Other Clinician: Massie Kluver Referring Kirk Basquez: Treating Exilda Wilhite/Extender: RO BSO N, MICHA EL Randa Lynn, Glenda Weeks in Treatment: 54 Visit Information History Since Last Visit All ordered tests and consults were completed: No Patient Arrived: Wheel Chair Added or deleted any medications: No Arrival Time: 08:16 Any new allergies or adverse reactions: No Transfer Assistance: EasyPivot Patient Lift Had a fall or experienced change in No Patient Requires Transmission-Based Precautions: No activities of daily living that may affect Patient Has Alerts: Yes risk of falls: Patient Alerts: Patient on Blood Thinner Hospitalized since last visit: No DIABETIC Pain Present Now: Yes Plavix Electronic Signature(s) Signed: 08/25/2022 7:57:14 AM By: Massie Kluver Entered By: Massie Kluver on 08/24/2022 08:18:29 -------------------------------------------------------------------------------- Clinic Level of Care Assessment Details Patient Name: Date of Service: YOSMAR, RYKER EL 08/24/2022 8:15 A M Medical Record Number: [id number] Patient Account Number: [id number] Date of Birth/Sex: Treating RN: Aug 19, 1956 (66 y.o. [name] Primary Care Elliot Simoneaux: [name] Other Clinician: Massie Kluver Referring Raedyn Wenke: Treating Lucas Winograd/Extender: RO BSO N, [place], Glenda Weeks in Treatment: 41 Clinic Level of Care Assessment Items TOOL 1 Quantity Score []  - 0 Use when EandM and Procedure is performed on INITIAL visit ASSESSMENTS - Nursing Assessment / Reassessment []  - 0 General Physical Exam (combine  w/ comprehensive assessment (listed just below) when performed on new pt. evals) []  - 0 Comprehensive Assessment (HX, ROS, Risk Assessments, Wounds Hx, etc.) ASSESSMENTS - Wound and Skin Assessment / Reassessment []  - 0 Dermatologic / Skin Assessment (not related to wound area) ASSESSMENTS - Ostomy and/or Continence Assessment and [place], [place] ([id number]) [id number] Page 2 of 8 []  - 0 Incontinence Assessment and Management []  - 0 Ostomy Care Assessment and Management (repouching, etc.) PROCESS - Coordination of Care []  - 0 Simple Patient / Family Education for ongoing care []  - 0 Complex (extensive) Patient / Family Education for ongoing care []  - 0 Staff obtains Programmer, systems, Records, T Results / Process Orders est []  - 0 Staff telephones HHA, Nursing Homes / Clarify orders / etc []  - 0 Routine Transfer to another Facility (non-emergent condition) []  - 0 Routine Hospital Admission (non-emergent condition) []  - 0 New Admissions / Biomedical engineer / Ordering NPWT Apligraf, etc. , []  - 0 Emergency Hospital Admission (emergent condition) PROCESS - Special Needs []  - 0 Pediatric / Minor Patient Management []  - 0 Isolation Patient Management []  - 0 Hearing / Language / Visual special needs []  - 0 Assessment of Community assistance (transportation, D/C planning, etc.) []  - 0 Additional assistance / Altered mentation []  - 0 Support Surface(s) Assessment (bed, cushion, seat, etc.) INTERVENTIONS - Miscellaneous []  - 0 External ear exam []  - 0 Patient Transfer (multiple staff / Civil Service fast streamer / Similar devices) []  - 0 Simple Staple / Suture removal (25 or less) []  - 0 Complex Staple / Suture removal (26 or more) []  - 0 Hypo/Hyperglycemic Management (do not check if billed separately) []  - 0 Ankle / Brachial Index (ABI) - do not check if billed separately Has the patient been seen at the hospital within the last three years: Yes Total  Score: 0 Level Of Care: ____ Electronic Signature(s) Signed: 08/25/2022 7:57:14 AM By: Massie Kluver Entered  By: Massie Kluver on 08/24/2022 08:49:24 -------------------------------------------------------------------------------- Encounter Discharge Information Details Patient Name: Date of Service: KEOKI, MCHARGUE EL 08/24/2022 8:15 A M Medical Record Number: [id number] Patient Account Number: [id number] Date of Birth/Sex: Treating RN: Sep 06, 1956 (66 y.o. [name] Primary Care Cortasia Screws: [name] Other Clinician: Massie Kluver Referring Kamryn Gauthier: Treating Azriel Jakob/Extender: RO BSO N, MICHA EL Randa Lynn, Glenda Weeks in Treatment: 47 Encounter Discharge Information Items Post Procedure Vitals Discharge Condition: Stable Temperature (F): 98.1 Se[place]ner, [place] ([id number]) [id number] Page 3 of 8 Ambulatory Status: Wheelchair Pulse (bpm): 89 Discharge Destination: Home Respiratory Rate (breaths/min): 16 Transportation: Private Auto Blood Pressure (mmHg): 117/78 Accompanied By: self Schedule Follow-up Appointment: Yes Clinical Summary of Care: Electronic Signature(s) Signed: 08/25/2022 7:57:14 AM By: Massie Kluver Entered By: Massie Kluver on 08/24/2022 08:53:42 -------------------------------------------------------------------------------- Lower Extremity Assessment Details Patient Name: Date of Service: SAMMUEL, BLICK EL 08/24/2022 8:15 A M Medical Record Number: [id number] Patient Account Number: [id number] Date of Birth/Sex: Treating RN: 1956-01-30 (66 y.o. [name] Primary Care Riley Hallum: [name] Other Clinician: Massie Kluver Referring Roberto Hlavaty: Treating Akisha Sturgill/Extender: RO BSO N, [place], Glenda Weeks in Treatment: 41 Electronic Signature(s) Signed: 08/24/2022 9:20:59 AM By: Gretta Cool, BSN, RN, CWS, Kim RN, BSN Signed: 08/25/2022 7:57:14 AM By: Massie Kluver Entered By: Massie Kluver on 08/24/2022  08:26:26 -------------------------------------------------------------------------------- Multi Wound Chart Details Patient Name: Date of Service: Loa Socks EL 08/24/2022 8:15 A M Medical Record Number: [id number] Patient Account Number: [id number] Date of Birth/Sex: Treating RN: 1956/03/07 (66 y.o. [name] Primary Care Warda Mcqueary: [name] Other Clinician: Massie Kluver Referring Earlie Schank: Treating Roberta Angell/Extender: RO BSO N, MICHA EL Randa Lynn, Glenda Weeks in Treatment: 41 Vital Signs Height(in): 74 Pulse(bpm): 89 Weight(lbs): 226 Blood Pressure(mmHg): 117/78 Body Mass Index(BMI): 29 Temperature(F): 98.1 Respiratory Rate(breaths/min): 16 [1:Photos:] [N/A:N/A] Right, Plantar Foot N/A N/A Wound Location: Blister N/A N/A Wounding Event: Diabetic Wound/Ulcer of the Lower N/A N/A Primary Etiology: Extremity Type II Diabetes, Osteoarthritis N/A N/A Comorbid History: 07/15/2020 N/A N/A Date Acquired: 37 N/A N/A Weeks of Treatment: Open N/A N/A Wound Status: No N/A N/A Wound Recurrence: 0.6x0.7x0.2 N/A N/A Measurements L x W x D (cm) 0.33 N/A N/A A (cm) : rea 0.066 N/A N/A Volume (cm) : 70.60% N/A N/A % Reduction in A rea: 90.20% N/A N/A % Reduction in Volume: Grade 2 N/A N/A Classification: Medium N/A N/A Exudate A mount: Serosanguineous N/A N/A Exudate Type: red, brown N/A N/A Exudate Color: Thickened N/A N/A Wound Margin: Large (67-100%) N/A N/A Granulation A mount: Red N/A N/A Granulation Quality: Small (1-33%) N/A N/A Necrotic A mount: Fat Layer (Subcutaneous Tissue): Yes N/A N/A Exposed Structures: Fascia: No Tendon: No Muscle: No Joint: No Bone: No None N/A N/A Epithelialization: Treatment Notes Electronic Signature(s) Signed: 08/25/2022 7:57:14 AM By: Massie Kluver Entered By: Massie Kluver on 08/24/2022 08:26:38 -------------------------------------------------------------------------------- Multi-Disciplinary Care  Plan Details Patient Name: Date of Service: Loa Socks EL 08/24/2022 8:15 A M Medical Record Number: [id number] Patient Account Number: [id number] Date of Birth/Sex: Treating RN: October 07, 1956 (66 y.o. [name] Primary Care Dominque Levandowski: [name] Other Clinician: Massie Kluver Referring Bodee Lafoe: Treating Saanvika Vazques/Extender: RO BSO N, MICHA EL Randa Lynn, Glenda Weeks in Treatment: 14 Active Inactive Wound/Skin Impairment Nursing Diagnoses: Impaired tissue integrity Knowledge deficit related to smoking impact on wound healing Knowledge deficit related to ulceration/compromised skin integrity Goals: Patient/caregiver will verbalize understanding of skin care regimen Memmer, Jams ([id number]) [id number] Page 5 of 8 Date Initiated: 11/10/2021 Date Inactivated: 12/01/2021 Target Resolution Date: 11/17/2021 Goal Status:  Met Ulcer/skin breakdown will have a volume reduction of 30% by week 4 Date Initiated: 11/10/2021 Date Inactivated: 02/02/2022 Target Resolution Date: 12/08/2021 Goal Status: Met Ulcer/skin breakdown will have a volume reduction of 50% by week 8 Date Initiated: 11/10/2021 Target Resolution Date: 01/05/2022 Goal Status: Active Ulcer/skin breakdown will have a volume reduction of 80% by week 12 Date Initiated: 11/10/2021 Target Resolution Date: 02/02/2022 Goal Status: Active Ulcer/skin breakdown will heal within 14 weeks Date Initiated: 11/10/2021 Target Resolution Date: 02/16/2022 Goal Status: Active Interventions: Assess patient/caregiver ability to obtain necessary supplies Assess patient/caregiver ability to perform ulcer/skin care regimen upon admission and as needed Assess ulceration(s) every visit Notes: Electronic Signature(s) Signed: 08/24/2022 9:20:59 AM By: Gretta Cool, BSN, RN, CWS, Kim RN, BSN Signed: 08/25/2022 7:57:14 AM By: Massie Kluver Entered By: Massie Kluver on 08/24/2022  08:26:31 -------------------------------------------------------------------------------- Pain Assessment Details Patient Name: Date of Service: Loa Socks EL 08/24/2022 8:15 A M Medical Record Number: [id number] Patient Account Number: [id number] Date of Birth/Sex: Treating RN: February 07, 1956 (66 y.o. [name] Primary Care Kamelia Lampkins: [name] Other Clinician: Massie Kluver Referring Ingvald Theisen: Treating Finlee Concepcion/Extender: RO BSO N, MICHA EL Randa Lynn, Glenda Weeks in Treatment: 15 Active Problems Location of Pain Severity and Description of Pain Patient Has Paino Yes Site Locations Pain Location: Generalized Pain, Pain in Ulcers Duration of the Pain. Constant / Intermittento Constant Rate the pain. Current Pain Level: 5 Character of Pain Describe the Pain: Aijalon Kirtz, [place] ([id number]) [id number] Page 6 of 8 Pain Management and Medication Current Pain Management: Medication: Yes Rest: Yes Electronic Signature(s) Signed: 08/24/2022 9:20:59 AM By: Gretta Cool, BSN, RN, CWS, Kim RN, BSN Signed: 08/25/2022 7:57:14 AM By: Massie Kluver Entered By: Massie Kluver on 08/24/2022 08:21:05 -------------------------------------------------------------------------------- Patient/Caregiver Education Details Patient Name: Date of Service: Loa Socks EL 10/25/2023andnbsp8:15 A M Medical Record Number: [id number] Patient Account Number: [id number] Date of Birth/Gender: Treating RN: [birth year] (66 y.o. [name] Primary Care Physician: [name] Other Clinician: Massie Kluver Referring Physician: Treating Physician/Extender: RO BSO N, MICHA EL Carmelina Noun Weeks in Treatment: 58 Education Assessment Education Provided To: Patient Education Topics Provided Wound/Skin Impairment: Handouts: Other: Continue wound care as directed Methods: Explain/Verbal Responses: State content correctly Electronic Signature(s) Signed: 08/25/2022  7:57:14 AM By: Massie Kluver Entered By: Massie Kluver on 08/24/2022 08:52:58 -------------------------------------------------------------------------------- Wound Assessment Details Patient Name: Date of Service: Loa Socks EL 08/24/2022 8:15 A M Medical Record Number: [id number] Patient Account Number: [id number] Date of Birth/Sex: Treating RN: [birth year] (66 y.o. [name] Primary Care Jamieka Royle: [name] Other Clinician: Massie Kluver Referring Katianne Barre: Treating Lanaya Bennis/Extender: Eldridge Dace, MICHA EL Randa Lynn, Glenda Weeks in Treatment: [place], [place] ([id number]) [id number] Page 7 of 8 Wound Status Wound Number: 1 Primary Etiology: Diabetic Wound/Ulcer of the Lower Extremity Wound Location: Right, Plantar Foot Wound Status: Open Wounding Event: Blister Comorbid History: Type II Diabetes, Osteoarthritis Date Acquired: 07/15/2020 Weeks Of Treatment: 41 Clustered Wound: No Photos Wound Measurements Length: (cm) 0.6 Width: (cm) 0.7 Depth: (cm) 0.2 Area: (cm) 0.33 Volume: (cm) 0.066 % Reduction in Area: 70.6% % Reduction in Volume: 90.2% Epithelialization: None Wound Description Classification: Grade 2 Wound Margin: Thickened Exudate Amount: Medium Exudate Type: Serosanguineous Exudate Color: red, brown Foul Odor After Cleansing: No Slough/Fibrino Yes Wound Bed Granulation Amount: Large (67-100%) Exposed Structure Granulation Quality: Red Fascia Exposed: No Necrotic Amount: Small (1-33%) Fat Layer (Subcutaneous Tissue) Exposed: Yes Necrotic Quality: Adherent Slough Tendon Exposed: No Muscle Exposed: No Joint Exposed: No Bone Exposed: No Treatment Notes Wound #  1 (Foot) Wound Laterality: Plantar, Right Cleanser Wound Cleanser Discharge Instruction: Wash your hands with soap and water. Remove old dressing, discard into plastic bag and place into trash. Cleanse the wound with Wound Cleanser prior to applying a clean  dressing using gauze sponges, not tissues or cotton balls. Do not scrub or use excessive force. Pat dry using gauze sponges, not tissue or cotton balls. Peri-Wound Care Topical Primary Dressing PuraPly Discharge Instruction: #3 Secondary Dressing Zetuvit Plus 4x4 (in/in) Secured With Conform 4'' - Conforming Stretch Gauze Bandage 4x75 (in/in) Discharge Instruction: Apply as directed Compression Wrap Compression Stockings Add-Ons [place], [place] ([id number]) [id number] Page 8 of 8 Electronic Signature(s) Signed: 08/24/2022 9:20:59 AM By: Gretta Cool, BSN, RN, CWS, Kim RN, BSN Signed: 08/25/2022 7:57:14 AM By: Massie Kluver Entered By: Massie Kluver on 08/24/2022 08:26:16 -------------------------------------------------------------------------------- [place] Details Patient Name: Date of Service: Loa Socks EL 08/24/2022 8:15 A M Medical Record Number: [id number] Patient Account Number: [id number] Date of Birth/Sex: Treating RN: Mar 26, 1956 (66 y.o. [name] Primary Care Vanesha [place]: [name] Other Clinician: Massie Kluver Referring Cherith Tewell: Treating Leontae Bostock/Extender: RO BSO N, MICHA EL Randa Lynn, Glenda Weeks in Treatment: 41 Vital Signs Time Taken: 08:19 Temperature (F): 98.1 Height (in): 74 Pulse (bpm): 89 Weight (lbs): 226 Respiratory Rate (breaths/min): 16 Body Mass Index (BMI): 29 Blood Pressure (mmHg): 117/78 Reference Range: 80 - 120 mg / dl Electronic Signature(s) Signed: 08/25/2022 7:57:14 AM By: Massie Kluver Entered By: Massie Kluver on 08/24/2022 08:20:59

## 2022-08-26 ENCOUNTER — Ambulatory Visit (INDEPENDENT_AMBULATORY_CARE_PROVIDER_SITE_OTHER): Payer: Medicare Other | Admitting: Podiatry

## 2022-08-26 DIAGNOSIS — Z89432 Acquired absence of left foot: Secondary | ICD-10-CM

## 2022-08-26 DIAGNOSIS — E119 Type 2 diabetes mellitus without complications: Secondary | ICD-10-CM

## 2022-08-26 NOTE — Progress Notes (Signed)
  Subjective:  Patient ID: [name], male    DOB: 06-Nov-1955,  MRN: [id number]  Chief Complaint  Patient presents with   Routine Post Op    POV #1 DOS 08/18/2022 TRANSMETATARSAL AMPUTATION;ACHILLES LENGTHENING/KIDNER    DOS: 08/18/2022 Procedure: Left transmetatarsal amputation with Achilles tendon lengthening  66 y.o. male returns for post-op check.  Patient states he is doing okay.  He has been putting weight to the heel.  He denies any other acute complaints bandages clean dry and intact.  Review of Systems: Negative except as noted in the HPI. Denies N/V/F/Ch.  Past Medical History:  Diagnosis Date   Diabetes mellitus without complication (HCC)    Gout     Current Outpatient Medications:    allopurinol (ZYLOPRIM) 300 MG tablet, Take 300 mg by mouth daily., Disp: , Rfl:    atorvastatin (LIPITOR) 80 MG tablet, Take 1 tablet (80 mg total) by mouth daily., Disp: 90 tablet, Rfl: 0   doxycycline (DORYX) 100 MG EC tablet, Take 1 tablet (100 mg total) by mouth 2 (two) times daily for 10 days., Disp: 20 tablet, Rfl: 0   empagliflozin (JARDIANCE) 25 MG TABS tablet, Take 25 mg by mouth daily., Disp: , Rfl:    gabapentin (NEURONTIN) 400 MG capsule, Take 400 mg by mouth 3 (three) times daily., Disp: , Rfl:    lisinopril (ZESTRIL) 5 MG tablet, Take 5 mg by mouth daily., Disp: , Rfl:    metFORMIN (GLUCOPHAGE) 500 MG tablet, Take 1,000 mg by mouth 2 (two) times daily with a meal., Disp: , Rfl:    QUEtiapine (SEROQUEL) 25 MG tablet, Take 25 mg by mouth at bedtime., Disp: , Rfl:    rOPINIRole (REQUIP) 0.5 MG tablet, Take 0.5 mg by mouth at bedtime., Disp: , Rfl:    Semaglutide 14 MG TABS, Take 14 mg by mouth daily., Disp: , Rfl:    sildenafil (VIAGRA) 100 MG tablet, Take 100 mg by mouth daily as needed for erectile dysfunction., Disp: , Rfl:    testosterone cypionate (DEPOTESTOSTERONE CYPIONATE) 200 MG/ML injection, Inject into the muscle every 14 (fourteen) days., Disp: , Rfl:   Social  History   Tobacco Use  Smoking Status Former   Types: Cigarettes   Quit date: 10/07/1997   Years since quitting: 24.9  Smokeless Tobacco Never    No Known Allergies Objective:  There were no vitals filed for this visit. There is no height or weight on file to calculate BMI. Constitutional Well developed. Well nourished.  Vascular Foot warm and well perfused. Capillary refill normal to all digits.   Neurologic Normal speech. Oriented to person, place, and time. Epicritic sensation to light touch grossly present bilaterally.  Dermatologic Skin healing well without signs of infection. Skin edges well coapted without signs of infection.  Orthopedic: Tenderness to palpation noted about the surgical site.   Radiographs: None Assessment:   1. Type 2 diabetes mellitus without complication, unspecified whether long term insulin use ([place])   2. S/P transmetatarsal amputation of foot, left ([place])    Plan:  Patient was evaluated and treated and all questions answered.  S/p foot surgery left -Progressing as expected post-operatively. -XR: See above -WB Status: Nonweightbearing in left lower extremity.  I encouraged him to be more nonweightbearing -Sutures: Intact.  No clinical signs of Deis is noted.  No complication noted. -Medications: None -I encouraged him to do Betadine wet-to-dry dressing changes daily -Foot redressed.  No follow-ups on file.

## 2022-08-29 ENCOUNTER — Telehealth: Payer: Self-pay | Admitting: Podiatry

## 2022-08-29 NOTE — Telephone Encounter (Signed)
Patient does not have a scheduled appointment( not listed in last office visit) to have sutures removed.

## 2022-08-29 NOTE — Telephone Encounter (Signed)
Patient wants to know when is he to come back to have his staples removed ?  Please advise

## 2022-08-30 NOTE — Telephone Encounter (Signed)
Spoke with patient giving instructions that he can have them removed with wound center,said that he is f/u  with them on tomorrow, will see if they can remove, if not will scheduled appointment here to have it done.

## 2022-08-31 ENCOUNTER — Encounter: Payer: Medicare Other | Attending: Internal Medicine | Admitting: Internal Medicine

## 2022-08-31 DIAGNOSIS — E1142 Type 2 diabetes mellitus with diabetic polyneuropathy: Secondary | ICD-10-CM | POA: Insufficient documentation

## 2022-08-31 DIAGNOSIS — L97512 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ed: Secondary | ICD-10-CM | POA: Diagnosis not present

## 2022-08-31 DIAGNOSIS — X58XXXS Exposure to other specified factors, sequela: Secondary | ICD-10-CM | POA: Diagnosis not present

## 2022-08-31 DIAGNOSIS — E1140 Type 2 diabetes mellitus with diabetic neuropathy, unspecified: Secondary | ICD-10-CM | POA: Insufficient documentation

## 2022-08-31 DIAGNOSIS — M1990 Unspecified osteoarthritis, unspecified site: Secondary | ICD-10-CM | POA: Diagnosis not present

## 2022-08-31 DIAGNOSIS — R2689 Other abnormalities of gait and mobility: Secondary | ICD-10-CM | POA: Insufficient documentation

## 2022-08-31 DIAGNOSIS — E11621 Type 2 diabetes mellitus with foot ulcer: Secondary | ICD-10-CM | POA: Diagnosis not present

## 2022-08-31 DIAGNOSIS — S98022S 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, sequela: Secondary | ICD-10-CM | POA: Insufficient documentation

## 2022-08-31 DIAGNOSIS — Z7984 Long term (current) use of oral hypoglycemic drugs: Secondary | ICD-10-CM | POA: Diagnosis not present

## 2022-08-31 NOTE — Progress Notes (Signed)
[name], [name] ([id number]) [id number] Page 1 of 8 Visit Report for 08/31/2022 Arrival Information Details Patient Name: Date of Service: [name], [name] 08/31/2022 8:15 A M Medical Record Number: [id number] Patient Account Number: [id number] Date of Birth/Sex: Treating RN: [birth year] (66 y.o. [name] Primary Care Zaniel Marineau: [name] Other Clinician: Massie Kluver Referring Anadalay Macdonell: Treating Makyla Bye/Extender: RO BSO N, MICHA [name] Randa Lynn, Glenda Weeks in Treatment: 24 Visit Information History Since Last Visit All ordered tests and consults were completed: No Patient Arrived: Ambulatory Added or deleted any medications: No Arrival Time: 08:06 Any new allergies or adverse reactions: No Transfer Assistance: None Had a fall or experienced change in No Patient Requires Transmission-Based Precautions: No activities of daily living that may affect Patient Has Alerts: Yes risk of falls: Patient Alerts: Patient on Blood Thinner Hospitalized since last visit: No DIABETIC Pain Present Now: Yes Plavix Electronic Signature(s) Signed: 08/31/2022 4:40:40 PM By: Massie Kluver Entered By: Massie Kluver on 08/31/2022 08:11:05 -------------------------------------------------------------------------------- Clinic Level of Care Assessment Details Patient Name: Date of ServiceMarland Kitchen DAMETRIUS, [name] 08/31/2022 8:15 A M Medical Record Number: [id number] Patient Account Number: [id number] Date of Birth/Sex: Treating RN: 1956-03-02 (66 y.o. [name] Primary Care Donie Lemelin: [name] Other Clinician: Massie Kluver Referring Teila Skalsky: Treating Jadi Deyarmin/Extender: RO BSO N, [place], Glenda Weeks in Treatment: 42 Clinic Level of Care Assessment Items TOOL 1 Quantity Score _0  - 0 Use when EandM and Procedure is performed on INITIAL visit ASSESSMENTS - Nursing Assessment / Reassessment _1  - 0 General Physical Exam (combine w/ comprehensive  assessment (listed just below) when performed on new pt. evals) _2  - 0 Comprehensive Assessment (HX, ROS, Risk Assessments, Wounds Hx, etc.) ASSESSMENTS - Wound and Skin Assessment / Reassessment _3  - 0 Dermatologic / Skin Assessment (not related to wound area) ASSESSMENTS - Ostomy and/or Continence Assessment and Care Kleinpeter, Amado ([id number]) [id number] Page 2 of 8 _4  - 0 Incontinence Assessment and Management _5  - 0 Ostomy Care Assessment and Management (repouching, etc.) PROCESS - Coordination of Care _6  - 0 Simple Patient / Family Education for ongoing care _7  - 0 Complex (extensive) Patient / Family Education for ongoing care _8  - 0 Staff obtains Programmer, systems, Records, T Results / Process Orders est _9  - 0 Staff telephones HHA, Nursing Homes / Clarify orders / etc _10  - 0 Routine Transfer to another Facility (non-emergent condition) _11  - 0 Routine Hospital Admission (non-emergent condition) _12  - 0 New Admissions / Biomedical engineer / Ordering NPWT Apligraf, etc. , _13  - 0 Emergency Hospital Admission (emergent condition) PROCESS - Special Needs _14  - 0 Pediatric / Minor Patient Management _15  - 0 Isolation Patient Management _16  - 0 Hearing / Language / Visual special needs _17  - 0 Assessment of Community assistance (transportation, D/C planning, etc.) _18  - 0 Additional assistance / Altered mentation _19  - 0 Support Surface(s) Assessment (bed, cushion, seat, etc.) INTERVENTIONS - Miscellaneous _20  - 0 External ear exam _21  - 0 Patient Transfer (multiple staff / Civil Service fast streamer / Similar devices) _22  - 0 Simple Staple / Suture removal (25 or less) _23  - 0 Complex Staple / Suture removal (26 or more) _24  - 0 Hypo/Hyperglycemic Management (do not check if billed separately) _25  - 0 Ankle / Brachial Index (ABI) - do not check if billed separately Has the patient been seen at the hospital within the last three years: Yes Total Score: 0 Level Of  Care: ____ Electronic Signature(s) Signed: 08/31/2022 4:40:40 PM By: Massie Kluver Entered By: Massie Kluver  on 08/31/2022 08:37:22 -------------------------------------------------------------------------------- Encounter Discharge Information Details Patient Name: Date of Service: [name], [name] [name] 08/31/2022 8:15 A M Medical Record Number: [id number] Patient Account Number: [id number] Date of Birth/Sex: Treating RN: 1955/11/18 (66 y.o. [name] Primary Care Fran Mcree: [name] Other Clinician: Massie Kluver Referring Adilee Lemme: Treating Ayren Zumbro/Extender: RO BSO N, MICHA [name] Randa Lynn, Glenda Weeks in Treatment: 53 Encounter Discharge Information Items Post Procedure Vitals Discharge Condition: Stable Temperature (F): 97.9 Siemon, Gary ([id number]) [id number] Page 3 of 8 Ambulatory Status: Ambulatory Pulse (bpm): 98 Discharge Destination: Home Respiratory Rate (breaths/min): 18 Transportation: Private Auto Blood Pressure (mmHg): 125/81 Accompanied By: self Schedule Follow-up Appointment: Yes Clinical Summary of Care: Electronic Signature(s) Signed: 08/31/2022 4:40:40 PM By: Massie Kluver Entered By: Massie Kluver on 08/31/2022 08:58:44 -------------------------------------------------------------------------------- Lower Extremity Assessment Details Patient Name: Date of Service: BRENDIN, SITU [name] 08/31/2022 8:15 A M Medical Record Number: [id number] Patient Account Number: [id number] Date of Birth/Sex: Treating RN: 12-Jan-1956 (66 y.o. [name] Primary Care Brionna Romanek: [name] Other Clinician: Massie Kluver Referring Jeymi Hepp: Treating Ange Puskas/Extender: RO BSO N, MICHA [name] Randa Lynn, Glenda Weeks in Treatment: 42 Electronic Signature(s) Signed: 08/31/2022 4:40:40 PM By: Massie Kluver Signed: 08/31/2022 5:26:54 PM By: Gretta Cool, BSN, RN, CWS, Kim RN, BSN Entered By: Massie Kluver on 08/31/2022  08:24:06 -------------------------------------------------------------------------------- Multi Wound Chart Details Patient Name: Date of Service: Loa Socks [name] 08/31/2022 8:15 A M Medical Record Number: [id number] Patient Account Number: [id number] Date of Birth/Sex: Treating RN: Sep 11, 1956 (66 y.o. [name] Primary Care Wladyslawa Disbro: [name] Other Clinician: Massie Kluver Referring Baker Kogler: Treating Yeraldine Forney/Extender: RO BSO N, MICHA [name] Randa Lynn, Glenda Weeks in Treatment: 42 Vital Signs Height(in): 74 Pulse(bpm): 98 Weight(lbs): 226 Blood Pressure(mmHg): 125/81 Body Mass Index(BMI): 29 Temperature(F): 97.9 Respiratory Rate(breaths/min): 18 [1:Photos:] [N/A:N/A] Right, Plantar Foot N/A N/A Wound Location: Blister N/A N/A Wounding Event: Diabetic Wound/Ulcer of the Lower N/A N/A Primary Etiology: Extremity Type II Diabetes, Osteoarthritis N/A N/A Comorbid History: 07/15/2020 N/A N/A Date Acquired: 50 N/A N/A Weeks of Treatment: Open N/A N/A Wound Status: No N/A N/A Wound Recurrence: 0.5x0.7x0.3 N/A N/A Measurements L x W x D (cm) 0.275 N/A N/A A (cm) : rea 0.082 N/A N/A Volume (cm) : 75.50% N/A N/A % Reduction in A rea: 87.80% N/A N/A % Reduction in Volume: Grade 2 N/A N/A Classification: Medium N/A N/A Exudate A mount: Serosanguineous N/A N/A Exudate Type: red, brown N/A N/A Exudate Color: Thickened N/A N/A Wound Margin: Large (67-100%) N/A N/A Granulation A mount: Red N/A N/A Granulation Quality: Small (1-33%) N/A N/A Necrotic A mount: Fat Layer (Subcutaneous Tissue): Yes N/A N/A Exposed Structures: Fascia: No Tendon: No Muscle: No Joint: No Bone: No None N/A N/A Epithelialization: Treatment Notes Electronic Signature(s) Signed: 08/31/2022 4:40:40 PM By: Massie Kluver Entered By: Massie Kluver on 08/31/2022 08:24:49 -------------------------------------------------------------------------------- Yoder Details Patient Name: Date of Service: Loa Socks [name] 08/31/2022 8:15 A M Medical Record Number: [id number] Patient Account Number: [id number] Date of Birth/Sex: Treating RN: May 22, 1956 (66 y.o. [name] Primary Care Tressy Kunzman: [name] Other Clinician: Massie Kluver Referring Brayon Bielefeld: Treating Maude Hettich/Extender: RO BSO N, MICHA [name] Randa Lynn, Glenda Weeks in Treatment: 42 Active Inactive Wound/Skin Impairment Nursing Diagnoses: Impaired tissue integrity Knowledge deficit related to smoking impact on wound healing Knowledge deficit related to ulceration/compromised skin integrity Goals: Patient/caregiver will verbalize understanding of skin care regimen Cammack, Llewellyn ([id number]) [id number] Page 5 of 8 Date Initiated: 11/10/2021 Date Inactivated: 12/01/2021 Target Resolution Date: 11/17/2021 Goal Status: Met Ulcer/skin breakdown  will have a volume reduction of 30% by week 4 Date Initiated: 11/10/2021 Date Inactivated: 02/02/2022 Target Resolution Date: 12/08/2021 Goal Status: Met Ulcer/skin breakdown will have a volume reduction of 50% by week 8 Date Initiated: 11/10/2021 Target Resolution Date: 01/05/2022 Goal Status: Active Ulcer/skin breakdown will have a volume reduction of 80% by week 12 Date Initiated: 11/10/2021 Target Resolution Date: 02/02/2022 Goal Status: Active Ulcer/skin breakdown will heal within 14 weeks Date Initiated: 11/10/2021 Target Resolution Date: 02/16/2022 Goal Status: Active Interventions: Assess patient/caregiver ability to obtain necessary supplies Assess patient/caregiver ability to perform ulcer/skin care regimen upon admission and as needed Assess ulceration(s) every visit Notes: Electronic Signature(s) Signed: 08/31/2022 4:40:40 PM By: Massie Kluver Signed: 08/31/2022 5:26:54 PM By: Gretta Cool, BSN, RN, CWS, Kim RN, BSN Entered By: Massie Kluver on 08/31/2022  08:24:10 -------------------------------------------------------------------------------- Pain Assessment Details Patient Name: Date of Service: Loa Socks [name] 08/31/2022 8:15 A M Medical Record Number: [id number] Patient Account Number: [id number] Date of Birth/Sex: Treating RN: [birth year] (66 y.o. [name] Primary Care Lonya Johannesen: [name] Other Clinician: Massie Kluver Referring Dynasty Holquin: Treating Tahliyah Anagnos/Extender: RO BSO N, MICHA [name] Randa Lynn, Glenda Weeks in Treatment: 42 Active Problems Location of Pain Severity and Description of Pain Patient Has Paino Yes Site Locations Pain Location: Generalized Pain, Pain in Ulcers Duration of the Pain. Constant / Intermittento Constant Rate the pain. Current Pain Level: 5 Character of Pain Describe the Pain: Dominico, Rod, Alexei ([id number]) [id number] Page 6 of 8 Pain Management and Medication Current Pain Management: Medication: Yes Rest: Yes Electronic Signature(s) Signed: 08/31/2022 4:40:40 PM By: Massie Kluver Signed: 08/31/2022 5:26:54 PM By: Gretta Cool, BSN, RN, CWS, Kim RN, BSN Entered By: Massie Kluver on 08/31/2022 08:14:50 -------------------------------------------------------------------------------- Patient/Caregiver Education Details Patient Name: Date of Service: Loa Socks [name] 11/1/2023andnbsp8:15 A M Medical Record Number: [id number] Patient Account Number: [id number] Date of Birth/Gender: Treating RN: 11/26/1955 (66 y.o. [name] Primary Care Physician: [name] Other Clinician: Massie Kluver Referring Physician: Treating Physician/Extender: RO BSO N, MICHA [name] Carmelina Noun Weeks in Treatment: 42 Education Assessment Education Provided To: Patient Education Topics Provided Wound/Skin Impairment: Handouts: Other: continue wound care as directed Methods: Explain/Verbal Responses: State content correctly Electronic Signature(s) Signed:  08/31/2022 4:40:40 PM By: Massie Kluver Entered By: Massie Kluver on 08/31/2022 08:38:40 -------------------------------------------------------------------------------- Wound Assessment Details Patient Name: Date of Service: ELLSWORTH, WALDSCHMIDT [name] 08/31/2022 8:15 A M Medical Record Number: [id number] Patient Account Number: [id number] Date of Birth/Sex: Treating RN: 1956-02-17 (66 y.o. [name] Primary Care Kipling Graser: [name] Other Clinician: Massie Kluver Referring Eilyn Polack: Treating Prince Couey/Extender: Eldridge Dace, MICHA [name] Randa Lynn, Glenda Weeks in Treatment: [place]ter, [place] ([id number]) [id number] Page 7 of 8 Wound Status Wound Number: 1 Primary Etiology: Diabetic Wound/Ulcer of the Lower Extremity Wound Location: Right, Plantar Foot Wound Status: Open Wounding Event: Blister Comorbid History: Type II Diabetes, Osteoarthritis Date Acquired: 07/15/2020 Weeks Of Treatment: 42 Clustered Wound: No Photos Wound Measurements Length: (cm) 0.5 Width: (cm) 0.7 Depth: (cm) 0.3 Area: (cm) 0.275 Volume: (cm) 0.082 % Reduction in Area: 75.5% % Reduction in Volume: 87.8% Epithelialization: None Tunneling: No Undermining: No Wound Description Classification: Grade 2 Wound Margin: Thickened Exudate Amount: Medium Exudate Type: Serosanguineous Exudate Color: red, brown Foul Odor After Cleansing: No Slough/Fibrino Yes Wound Bed Granulation Amount: Large (67-100%) Exposed Structure Granulation Quality: Red Fascia Exposed: No Necrotic Amount: Small (1-33%) Fat Layer (Subcutaneous Tissue) Exposed: Yes Necrotic Quality: Adherent Slough Tendon Exposed: No Muscle Exposed: No Joint Exposed: No Bone Exposed: No Treatment  Notes Wound #1 (Foot) Wound Laterality: Plantar, Right Cleanser Wound Cleanser Discharge Instruction: Wash your hands with soap and water. Remove old dressing, discard into plastic bag and place into trash. Cleanse the wound with  Wound Cleanser prior to applying a clean dressing using gauze sponges, not tissues or cotton balls. Do not scrub or use excessive force. Pat dry using gauze sponges, not tissue or cotton balls. Peri-Wound Care Topical Primary Dressing PuraPly Secondary Dressing Zetuvit Plus 4x4 (in/in) Secured With Conform 4'' - Conforming Stretch Gauze Bandage 4x75 (in/in) Discharge Instruction: Apply as directed Compression Wrap Compression Stockings Add-Ons Electronic Signature(s) Smyers, Nekoda ([id number]) [id number] Page 8 of 8 Signed: 08/31/2022 4:40:40 PM By: Massie Kluver Signed: 08/31/2022 5:26:54 PM By: Gretta Cool, BSN, RN, CWS, Kim RN, BSN Entered By: Massie Kluver on 08/31/2022 08:23:58 -------------------------------------------------------------------------------- Vitals Details Patient Name: Date of Service: Loa Socks [name] 08/31/2022 8:15 A M Medical Record Number: [id number] Patient Account Number: [id number] Date of Birth/Sex: Treating RN: Mar 10, 1956 (66 y.o. [name] Primary Care Miguel Christiana: [name] Other Clinician: Massie Kluver Referring Michaelle Bottomley: Treating Monserat Prestigiacomo/Extender: RO BSO N, MICHA [name] Randa Lynn, Glenda Weeks in Treatment: 42 Vital Signs Time Taken: 08:11 Temperature (F): 97.9 Height (in): 74 Pulse (bpm): 98 Weight (lbs): 226 Respiratory Rate (breaths/min): 18 Body Mass Index (BMI): 29 Blood Pressure (mmHg): 125/81 Reference Range: 80 - 120 mg / dl Electronic Signature(s) Signed: 08/31/2022 4:40:40 PM By: Massie Kluver Entered By: Massie Kluver on 08/31/2022 08:14:46

## 2022-08-31 NOTE — Progress Notes (Signed)
[name], [name] ([id number]) [id number] Page 1 of 10 Visit Report for 08/31/2022 Cellular or Tissue Based Product Details Patient Name: Date of Service: [name], [name] 08/31/2022 8:15 A M Medical Record Number: [id number] Patient Account Number: [id number] Date of Birth/Sex: Treating RN: 1956-08-16 (66 y.o. [name] Primary Care Provider: Manson Allan Other Clinician: Betha Loa Referring Provider: Treating Provider/Extender: RO BSO N, MICHA [name] Buck Mam Weeks in Treatment: 42 Cellular or Tissue Based Product Type Wound #1 Right,Plantar Foot Applied to: Performed By: Physician Maxwell Caul, MD Cellular or Tissue Based Product Type: Puraply AM Level of Consciousness (Pre-procedure): Awake and Alert Pre-procedure Verification/Time Out Yes - 08:33 Taken: Location: genitalia / hands / feet / multiple digits Wound Size (sq cm): 0.35 Product Size (sq cm): 4 Waste Size (sq cm): 0 Amount of Product Applied (sq cm): 4 Instrument Used: Forceps, Scissors Lot #: [id number] Order #: 6 Expiration Date: 11/16/2024 Fenestrated: No Reconstituted: No Secured: Yes Secured With: Steri-Strips, Mepitel Dressing Applied: Yes Primary Dressing: Bolster Response to Treatment: Procedure was tolerated well Level of Consciousness (Post- Awake and Alert procedure): Post Procedure Diagnosis Same as Pre-procedure Electronic Signature(s) Signed: 08/31/2022 4:40:40 PM By: Betha Loa Entered By: Betha Loa on 08/31/2022 08:36:15 -------------------------------------------------------------------------------- HPI Details Patient Name: Date of Service: [name] 08/31/2022 8:15 A M Medical Record Number: [id number] Patient Account Number: [id number] EROS, MONTOUR ([id number]) [id number] Page 2 of 10 Date of Birth/Sex: Treating RN: [birth year] (66 y.o. [name] Primary Care Provider: Other Clinician: Orvil Feil Referring Provider: Treating Provider/Extender: RO BSO N, MICHA [name] Merlyn Albert, Glenda Weeks in Treatment: 42 History of Present Illness HPI Description: Admission 11/10/2021 [name] is a 66 year old male with a past medical history of uncontrolled type 2 diabetes with last hemoglobin A1c of 8.1 complicated by peripheral neuropathy that presents to the clinic for a 2-year history of nonhealing ulcer to the bottom of his right foot. He states this started out as a blister caused by a work boot. He reports receiving wound care when he resided in [place]. He is reestablishing his wound care in [facility] today. He is currently keeping the area clean and covered. He has insoles designed to help offload the wound bed. He currently denies signs of infection. 1/18; patient presents for follow-up. He has been using Hydrofera Blue to the wound bed. He has no issues or complaints today. He has not received the defender.[name] He denies signs of infection. 2/1; patient presents for follow-up. He has been using Hydrofera Blue to the wound bed he states he received the defender boot and has been using it however he did not have it on today. He currently denies signs of infection to the right foot. Unfortunately he developed a wound to the left great toe over the past week. He states he received new orthotics and these caused a blister to the left great toe which turned into a wound. He has not been doing anything to the wound bed. He currently denies systemic signs of infection. 2/8; Patient presents for follow-up. 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 He states he is about 90% recovered. He has some mild weakness to the left side. He reports taking the antibiotics prescribed at last clinic visit. He reports using Hydrofera Blue for dressing changes. He  only uses the offloading boot when he  is at home. He uses open toed shoes to the right foot. He currently denies signs of infection. 2/15; patient presents for follow-up. He states he is still taking the antibiotics prescribed. He reports using the defender boot to the right foot and open toed shoe to the left foot however he is not wearing either today. He currently denies systemic signs of infection. He stated he cut down a tree last week and was outside doing yard work. 2/22; patient presents for follow-up. 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 He reports feeling well. He reports using the defender boot to the right leg and continues to use Hydrofera Blue with dressing changes. He denies systemic signs of infection. 3/8; patient presents for follow-up. He sees Dr. Alberteen Spindle tomorrow for follow-up of the left great toe amputation. This was a partial amputation. He reports he is taking Augmentin currently. He reports increased erythema to the left great toe amputation site. He also reports a decline in his right plantar foot wound. He reports it is draining more. He denies purulent drainage. 3/15; patient presents for follow-up. He did not see Dr. Alberteen Spindle last week but states he sees him today. He states he has been taking doxycycline in the past week and started gentamicin ointment. He continues to use [facility] with dressing changes. He currently denies systemic signs of infection. 3/22; patient presents for follow-up. Patient had partial amputation of the left great toe on 2/15. 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 Alberteen Spindle. He reports no issues and has no complaints. He is currently taking Keflex and doxycycline prescribed by Dr. Alberteen Spindle. He continues to use gentamicin ointment and Hydrofera Blue to the right foot wound. He reports using the defender boot when he is at home. 3/29; patient presents for follow-up. He continues to be on  antibiotics per podiatry. He has been using Hydrofera Blue and gentamicin ointment to the right plantar foot wound. He has no issues or complaints today. He denies signs of infection. 4/5; patient presents for follow-up. He has been using Dakin's wet-to-dry dressings with improvement to wound healing. He denies signs of infection. She has no issues or complaints today. 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 Use Hydrofera Blue for a prolonged period of time and has been using Dakin's wet-to-dry I think for the last 2 to 3 weeks. He has a Psychologist, forensic at home but he does not wear that coming into the clinic because he drives. He tells me he has been compliant with the defender boot short of when he goes out to drive. He lives alone. He also tells me he has had a previous left great toe amputation We are following him for the right foot wound. 4/26; patient presents for follow-up. He had worsening of his left foot wound and had surgical debridement on 4/16 by Dr. Fanny Skates. He is being followed by podiatry for this issue. He he is on IV Unasyn based on tissue culture by ID.[name] We are following him for the right foot wound. He has been using Dakin's wet-to- dry dressings without issues. He reports continued usage of the defender boot. 5/3; patient presents for follow-up. He has been using Dakin's wet-to-dry dressings to the right foot wound. He has no issues or complaints today. He reports using the Psychologist, forensic. He reports offloading the foot wound when he is at home by sitting in a recliner. He denies signs of infection.  He is still getting IV antibiotics For the left foot wound that is being followed by podiatry. He states the end date is later this month on 5/28. 5/17; patient presents for follow-up. He missed his last clinic appointment. He has been using Dakin's wet-to-dry dressings. He states he is offloading the right foot wound with the defender boot although he does  not have this today. He is still on IV antibiotics. He has no issues or complaints today. 5/24; patient presents for follow-up. He has been using Hydrofera Blue to the wound bed without issues. He denies signs of infection. At last clinic visit he had a wound culture done that detected high levels of Enterococcus faecalis and low levels of Corynebacterium stratum and Staphylococcus epidermidis. [facility] antibiotics were ordered. He has not heard from the company yet. 5/31; patient presents for follow-up. He continues to use Hydrofera Blue to the wound bed. He denies signs of infection. [facility] antibiotics were ordered at last clinic visit and he states he called the company to pay for it. He states that it is being shipped. We rediscussed the total contact cast and he declines having this placed today. He states he is wearing the defender boot however he never wears it into the office. 04-06-2022 upon evaluation today patient appears to be doing about the same in regard to his wound. This is not significantly smaller. He is not wearing his offloading boot. We discussed that today he tells me that he wears it "all the time as he chuckled and does not have it on during the office visit today. I think it is increasingly obvious that he is just messing around when he says this based on what I am seeing and otherwise I do not know why he would wear it all the time but not to the clinic. Either way my opinion based on what I am seeing currently is simply that the wound does seem to be still having quite a bit of callus buildup around the edges it is also very dry. He does tell me that he has been using the [facility] since he got it and to be honest I think this is good and should hopefully help keep things under control but at the same time I do not see any evidence of active infection. Again the Jodie Echevaria is a topical antibiotic with multiple compounds to help prevent further infection and worsening  overall. 6/14; patient presents for follow-up. He has been using Key[facility] antibiotics with [employer]	[place] daily. He states he is wearing his offloading boot. He does not have this in office today. He is going to the beach for the next 5 days. He states he Will not be using using any offloading device During this time. I again offered the total contact cast however he declined. I did ask him to reconsider for next clinic visit. He currently denies signs of infection. 6/21; patient presents for follow-up. He went to the beach last week. He did not use an offloading device. Wound is slightly deeper today. I again offered the total contact cast but he declined. Currently denies signs of infection. He reports using the [employer], hydrofera blue and the Psychologist, forensic. LADAVION, SAVITZ ([id number]) [id number] Page 3 of 10 6/28; patient presents for follow-up. Patient declines total contact cast today. He has been using [facility] and [facility] to the wound bed. It is unclear if he is using the Psychologist, forensic. He would like a letter for the post man to delivers mail to  his front door so he does not have to walk down the driveway. 7/5; patient presents for follow-up. He states he is considering the total contact cast however does not want this placed today. He is actually going to the gym after this appointment. He does not want offloading foot wear for this. He has been using [facility] and [facility] to the wound bed. He denies signs of infection. 7/12; patient presents for follow-up. He is still considering the total contact cast however does not want this today. He has been using [facility]stone antibiotic and Hydrofera Blue to the wound bed. He denies signs of infection. He has no issues or complaints today. 7/26; patient presents for follow-up. He declines a total contact cast. He has been using his an antibiotic and Hydrofera Blue to the wound bed. He denies systemic signs of  infection. He is scheduled to discuss orthotics next week with his podiatrist. 8/2; patient presents for follow-up. He has been using Hydrofera Blue to the wound bed. He has no issues or complaints today. He declines a total contact cast. He states he is getting orthotics today. 8/9; patient presents for follow-up. He has been using Hydrofera Blue to the wound bed. He again declines the total contact cast. He does state that he is considering starting it at the end of the month. He has a vacation coming up and does not want to have it in place for that. 8/16; patient presents for follow-up. He has been using Hydrofera Blue and [facility] to the wound bed. He started taking Augmentin and doxycycline as prescribed. He denies signs of infection. He is going on his vacation next week and will be back in 2 weeks. He states he is considering the total contact cast for when he comes back from his trip. 8/30; patient present for follow-up. He has been using Hydrofera Blue and [facility] to the wound bed. He continues to take doxycycline and Augmentin without issues. He went on vacation and reports walking for long periods of time without pressure relief to the wound bed. He currently denies systemic signs of infection. 9/6; patient presents for follow-up. He has been using Hydrofera Blue and [facility] to the wound bed. He is not wearing his offloading boot. He declines a total contact cast. He has finished Augmentin and doxycycline. He had an MRI completed on 07/01/2022 that did not show an underlying abscess, septic arthritis or osteomyelitis. He currently denies systemic signs of infection. 9/13; patient presents for follow-up. Has been using Hydrofera Blue and [facility] to the wound bed. He does not present with his offloading boot. He declines the total contact cast. He has been approved for PuraPly. We discussed this today and he would like this placed at next clinic visit. He denies signs of infection. 9/20;  patient presents for follow-up. We have been using Hydrofera Blue and [facility] antibiotic ointment. Patient denies signs of infection. PuraPly is available for placement and patient would like to proceed with this. 9/27; patient presents for follow-up. PuraPly #1 was placed in standard fashion to the right foot at last clinic visit. He tolerated this well. Unfortunately has developed a wound to the plantar surface of the left foot. He states he noticed this Sunday. He has pain to the site. He denies purulent drainage increased warmth or erythema. 10/4; patient presents for follow-up. PuraPly #2 was placed in standard fashion to the right foot at last clinic visit. He started taking Augmentin and doxycycline prescribed at last clinic visit due to  findings of infection to the left foot. He has been doing Dakin's wet-to-dry to this area. He reports stability in his symptoms. He denies systemic signs of infection. 10/11; patient presents for follow-up. PuraPly #3 was placed in standard fashion to the right foot at last clinic visit. Unfortunately the clinic did not order another PuraPly for this week. He continues to take Augmentin and doxycycline. He still has pain in the left foot. He had an x-ray that did not show radiographic evidence of osteomyelitis. He still has serous drainage on exam. He is doing Dakin's wet-to-dry dressings to this area. 10/18; patient presents for follow-up. He has been using Hydrofera Blue and [facility] to the right foot. PuraPly is available for placement today. He has been taking levofloxacin for the past week. He continues to have pain in the left foot with increased redness and swelling. An MRI was ordered at last clinic visit this has not been done yet. He has been doing Dakin's wet-to-dry dressings to the left foot. 10/25; since the patient was last here he was admitted to hospital from 1018 through 10/20. He underwent a transmetatarsal amputation by Dr. Allena Katz  of podiatry. He received vancomycin and Rocephin in the hospital and was discharged on 10 days of doxycycline. He has a follow-up with Dr. Allena Katz this week on Friday he is not changing the dressing. The wound we are following is on the plantar right foot between the first and second metatarsals. We applied puraply #5 today 11/1; patient came into the clinic today with a note from Dr. Allena Katz at [facility] asking [place] to look at the left foot and consider removing the sutures. Otherwise the area on his right foot that we have been applying Puraply to looks really very healthy we applied Puraply #6 Electronic Signature(s) Signed: 08/31/2022 3:59:15 PM By: Baltazar Najjar MD Entered By: Baltazar Najjar on 08/31/2022 08:40:51 -------------------------------------------------------------------------------- Physical Exam Details Patient Name: Date of Service: [name] 08/31/2022 8:15 A M Medical Record Number: [id number] Patient Account Number: [id number] Date of Birth/Sex: Treating RN: Nov 16, 1955 (66 y.o. [name] [place], Casimiro Needle ([id number]) [id number] Page 4 of 10 Primary Care Provider: Manson Allan Other Clinician: Betha Loa Referring Provider: Treating Provider/Extender: RO BSO N, MICHA [name] Merlyn Albert, Glenda Weeks in Treatment: 42 Constitutional Sitting or standing Blood Pressure is within target range for patient.. Pulse regular and within target range for patient.[name] Respirations regular, non-labored and within target range.. Temperature is normal and within the target range for the patient.[name] appears in no distress. Notes Wound exam Right foot plantar aspect. This looks very healthy healthy granulation no evidence of infection. Wound is remarkably smaller. Some maceration around the wound but nothing dramatic. Left TMA site. Both sides of this wound actually look well adhered however the middle aspect not quite together. The tissue here looks  and feels reasonable there is no probing depth and no evidence of infection Electronic Signature(s) Signed: 08/31/2022 3:59:15 PM By: Baltazar Najjar MD Entered By: Baltazar Najjar on 08/31/2022 08:42:10 -------------------------------------------------------------------------------- Physician Orders Details Patient Name: Date of Service: [name] 08/31/2022 8:15 A M Medical Record Number: [id number] Patient Account Number: [id number] Date of Birth/Sex: Treating RN: 02-08-1956 (66 y.o. [name] Primary Care Provider: Manson Allan Other Clinician: Betha Loa Referring Provider: Treating Provider/Extender: RO BSO N, MICHA [name] Merlyn Albert, Glenda Weeks in Treatment: 64 Verbal / Phone Orders: No Diagnosis Coding Follow-up Appointments Return Appointment in 1 week. Nurse Visit as needed Bathing/ Shower/ Hygiene May  shower; gently cleanse wound with antibacterial soap, rinse and pat dry prior to dressing wounds No tub bath. Anesthetic (Use 'Patient Medications' Section for Anesthetic Order Entry) Lidocaine applied to wound bed Cellular or Tissue Based Products Cellular or Tissue Based Product Type: - Puraply application #5 applied right foot Edema Control - Lymphedema / Segmental Compressive Device / Other Elevate, Exercise Daily and A void Standing for Long Periods of Time. Elevate leg(s) parallel to the floor when sitting. DO YOUR BEST to sleep in the bed at night. DO NOT sleep in your recliner. Long hours of sitting in a recliner leads to swelling of the legs and/or potential wounds on your backside. Off-Loading Foot Defender - right foot-keep shin covered-keep pressure off of the wound (Patient is not wear boot to clinic each week. Wearing tennis shoe) Additional Orders / Instructions Follow Nutritious Diet and Increase Protein Intake - monitor blood sugar to maintain normal limits Other: - Follow up post surgery for left foot-[facility] handling Wound  Treatment Wound #1 - Foot Wound Laterality: Plantar, Right Cleanser: Wound Cleanser Every Other Day/30 Days Discharge Instructions: Wash your hands with soap and water. Remove old dressing, discard into plastic bag and place into trash. Cleanse the wound with Wound Cleanser prior to applying a clean dressing using gauze sponges, not tissues or cotton balls. Do not scrub or use excessive Prichett, Coleson ([id number]) [id number] Page 5 of 10 force. Pat dry using gauze sponges, not tissue or cotton balls. Prim Dressing: PuraPly ary Every Other Day/30 Days Secondary Dressing: Zetuvit Plus 4x4 (in/in) (Generic) Every Other Day/30 Days Secured With: Conform 4'' - Conforming Stretch Gauze Bandage 4x75 (in/in) Every Other Day/30 Days Discharge Instructions: Apply as directed Electronic Signature(s) Signed: 08/31/2022 3:59:15 PM By: Baltazar Najjar MD Signed: 08/31/2022 4:40:40 PM By: Betha Loa Entered By: Betha Loa on 08/31/2022 08:37:13 -------------------------------------------------------------------------------- Problem List Details Patient Name: Date of Service: [name] 08/31/2022 8:15 A M Medical Record Number: [id number] Patient Account Number: [id number] Date of Birth/Sex: Treating RN: 1956/06/29 (66 y.o. [name] Primary Care Provider: Manson Allan Other Clinician: Betha Loa Referring Provider: Treating Provider/Extender: RO BSO N, MICHA [name] Merlyn Albert, Glenda Weeks in Treatment: 42 Active Problems ICD-10 Encounter Code Description Active Date MDM Diagnosis L97.512 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ed 11/10/2021 No Yes E11.621 Type 2 diabetes mellitus with foot ulcer 11/10/2021 No Yes E11.40 Type 2 diabetes mellitus with diabetic neuropathy, unspecified 11/10/2021 No Yes R26.89 Other abnormalities of gait and mobility 11/10/2021 No Yes S98.022S 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, sequela 08/31/2022 No Yes Inactive Problems ICD-10 Code Description Active Date Inactive Date L97.528 Non-pressure chronic ulcer of other part of left foot with other specified severity 12/01/2021 12/01/2021 S91.302A Unspecified open wound, left foot, initial encounter 07/27/2022 07/27/2022 [name] ([id number]) [id number] Page 6 of 10 M79.672 Pain in left foot 08/10/2022 08/10/2022 Resolved Problems Electronic Signature(s) Signed: 08/31/2022 3:59:15 PM By: Baltazar Najjar MD Entered By: Baltazar Najjar on 08/31/2022 08:39:27 -------------------------------------------------------------------------------- Progress Note Details Patient Name: Date of Service: [name] 08/31/2022 8:15 A M Medical Record Number: [id number] Patient Account Number: [id number] Date of Birth/Sex: Treating RN: [birth year] (66 y.o. [name] Primary Care Provider: Manson Allan Other Clinician: Betha Loa Referring Provider: Treating Provider/Extender: RO BSO N, MICHA [name] Merlyn Albert, Glenda Weeks in Treatment: 42 Subjective History of Present Illness (HPI) Admission 11/10/2021 Mr. Myrick Mcnairy is a 66 year old male with a past medical  history of uncontrolled type 2 diabetes with last hemoglobin A1c of 8.1 complicated by peripheral neuropathy that presents to the clinic for a 2-year history of nonhealing ulcer to the bottom of his right foot. He states this started out as a blister caused by a work boot. He reports receiving wound care [place]. [facility] He is currently keeping the area clean and covered. He has insoles designed to help offload the wound bed. He currently denies signs of infection. 1/18; patient presents for follow-up. He has been using Hydrofera Blue to the wound bed. He has no issues or complaints today. He has not received the defender.[name]. He denies signs of  infection. 2/1; patient presents for follow-up. He has been using Hydrofera Blue to the wound bed he states he received the defender boot and has been using it however he did not have it on today. He currently denies signs of infection to the right foot. Unfortunately he developed a wound to the left great toe over the past week. He states he received new orthotics and these caused a blister to the left great toe which turned into a wound. He has not been doing anything to the wound bed. He currently denies systemic signs of infection. 2/8; Patient presents for follow-up. 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 He states he is about 90% recovered. He has some mild weakness to the left side. He reports taking the antibiotics prescribed at last clinic visit. He reports using Hydrofera Blue for dressing changes. He only uses the offloading boot when he is at home. He uses open toed shoes to the right foot. He currently denies signs of infection. 2/15; patient presents for follow-up. He states he is still taking the antibiotics prescribed. He reports using the defender boot to the right foot and open toed shoe to the left foot however he is not wearing either today. He currently denies systemic signs of infection. He stated he cut down a tree last week and was outside doing yard work. 2/22; patient presents for follow-up. 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 He reports feeling well. He reports using the defender boot to the right leg and continues to use Hydrofera Blue with dressing changes. He denies systemic signs of infection. 3/8; patient presents for follow-up. He sees Dr. Alberteen Spindleline tomorrow for follow-up of the left great toe amputation. This was a partial amputation. He reports he is taking Augmentin currently. He reports increased erythema to the  left great toe amputation site. He also reports a decline in his right plantar foot wound. He reports it is draining more. He denies purulent drainage. 3/15; patient presents for follow-up. He did not see Dr. Alberteen Spindleline last week but states he sees him today. He states he has been taking doxycycline in the past week and started gentamicin ointment. [facility]. He currently denies systemic signs of infection. 3/22; patient presents for follow-up. Patient had partial amputation of the left great toe on 2/15. 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 Alberteen Spindleline. He reports no issues and has no complaints. He is currently taking Keflex and doxycycline prescribed by Dr. Alberteen Spindleline. He continues to use gentamicin ointment and Hydrofera Blue to the right  foot wound. He reports using the defender boot when he is at home. 3/29; patient presents for follow-up. He continues to be on antibiotics per podiatry. He has been using Hydrofera Blue and gentamicin ointment to the right plantar foot wound. He has no issues or complaints today. He denies signs of infection. 4/5; patient presents for follow-up. He has been using Dakin's wet-to-dry dressings with improvement to wound healing. He denies signs of infection. She has no issues or complaints today. 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 Use Hydrofera Blue for a prolonged period of time and has been using Dakin's wet-to-dry I think for the last 2 to 3 weeks. He has a Psychologist, forensic at home but he does not wear that coming into the clinic because he drives. He tells me he has been compliant with the defender boot short of when he goes out to drive. He lives alone. He also tells me he has had a previous left great toe amputation Melamed, Javian ([id number]) [id number] Page 7 of 10 We are following him for the right foot wound. 4/26; patient presents for  follow-up. He had worsening of his left foot wound and had surgical debridement on 4/16 by Dr. Fanny Skates. He is being followed by podiatry for this issue. He he is on IV Unasyn based on tissue culture by ID.[name] We are following him for the right foot wound. He has been using Dakin's wet-to- dry dressings without issues. He reports continued usage of the defender boot. 5/3; patient presents for follow-up. He has been using Dakin's wet-to-dry dressings to the right foot wound. He has no issues or complaints today. He reports using the Psychologist, forensic. He reports offloading the foot wound when he is at home by sitting in a recliner. He denies signs of infection. He is still getting IV antibiotics For the left foot wound that is being followed by podiatry. He states the end date is later this month on 5/28. 5/17; patient presents for follow-up. He missed his last clinic appointment. He has been using Dakin's wet-to-dry dressings. He states he is offloading the right foot wound with the defender boot although he does not have this today. He is still on IV antibiotics. He has no issues or complaints today. 5/24; patient presents for follow-up. He has been using Hydrofera Blue to the wound bed without issues. He denies signs of infection. At last clinic visit he had a wound culture done that detected high levels of Enterococcus faecalis and low levels of Corynebacterium stratum and Staphylococcus epidermidis. [facility] antibiotics were ordered. He has not heard from the company yet. 5/31; patient presents for follow-up. He continues to use Hydrofera Blue to the wound bed. He denies signs of infection. [facility] antibiotics were ordered at last clinic visit and he states he called the company to pay for it. He states that it is being shipped. We rediscussed the total contact cast and he declines having this placed today. He states he is wearing the defender boot however he never wears it into the office. 04-06-2022  upon evaluation today patient appears to be doing about the same in regard to his wound. This is not significantly smaller. He is not wearing his offloading boot. We discussed that today he tells me that he wears it "all the time as he chuckled and does not have it on during the office visit today. I think it is increasingly obvious that he is just messing around when he says this based on  what I am seeing and otherwise I do not know why he would wear it all the time but not to the clinic. Either way my opinion based on what I am seeing currently is simply that the wound does seem to be still having quite a bit of callus buildup around the edges it is also very dry. He does tell me that he has been using the [facility] since he got it and to be honest I think this is good and should hopefully help keep things under control but at the same time I do not see any evidence of active infection. Again the Jodie Echevaria is a topical antibiotic with multiple compounds to help prevent further infection and worsening overall. 6/14; patient presents for follow-up. He has been using Keystone antibiotics with [employer]	[place] daily. He states he is wearing his offloading boot. He does not have this in office today. He is going to the beach for the next 5 days. He states he Will not be using using any offloading device During this time. I again offered the total contact cast however he declined. I did ask him to reconsider for next clinic visit. He currently denies signs of infection. 6/21; patient presents for follow-up. He went to the beach last week. He did not use an offloading device. Wound is slightly deeper today. I again offered the total contact cast but he declined. Currently denies signs of infection. He reports using the [place], hydrofera blue and the Psychologist, forensic. 6/28; patient presents for follow-up. Patient declines total contact cast today. He has been using Keystone and [facility] to the wound bed. It  is unclear if he is using the Psychologist, forensic. He would like a letter for the post man to delivers mail to his front door so he does not have to walk down the driveway. 7/5; patient presents for follow-up. He states he is considering the total contact cast however does not want this placed today. He is actually going to the gym after this appointment. He does not want offloading foot wear for this. He has been using Keystone and [facility] to the wound bed. He denies signs of infection. 7/12; patient presents for follow-up. He is still considering the total contact cast however does not want this today. He has been using Keystone antibiotic and Hydrofera Blue to the wound bed. He denies signs of infection. He has no issues or complaints today. 7/26; patient presents for follow-up. He declines a total contact cast. He has been using his an antibiotic and Hydrofera Blue to the wound bed. He denies systemic signs of infection. He is scheduled to discuss orthotics next week with his podiatrist. 8/2; patient presents for follow-up. He has been using Hydrofera Blue to the wound bed. He has no issues or complaints today. He declines a total contact cast. He states he is getting orthotics today. 8/9; patient presents for follow-up. He has been using Hydrofera Blue to the wound bed. He again declines the total contact cast. He does state that he is considering starting it at the end of the month. He has a vacation coming up and does not want to have it in place for that. 8/16; patient presents for follow-up. He has been using Hydrofera Blue and [facility] to the wound bed. He started taking Augmentin and doxycycline as prescribed. He denies signs of infection. He is going on his vacation next week and will be back in 2 weeks. He states he is considering the total contact  cast for when he comes back from his trip. 8/30; patient present for follow-up. He has been using Hydrofera Blue and [facility] to the wound  bed. He continues to take doxycycline and Augmentin without issues. He went on vacation and reports walking for long periods of time without pressure relief to the wound bed. He currently denies systemic signs of infection. 9/6; patient presents for follow-up. He has been using Hydrofera Blue and [facility]stone to the wound bed. He is not wearing his offloading boot. He declines a total contact cast. He has finished Augmentin and doxycycline. He had an MRI completed on 07/01/2022 that did not show an underlying abscess, septic arthritis or osteomyelitis. He currently denies systemic signs of infection. 9/13; patient presents for follow-up. Has been using Hydrofera Blue and [facility] to the wound bed. He does not present with his offloading boot. He declines the total contact cast. He has been approved for PuraPly. We discussed this today and he would like this placed at next clinic visit. He denies signs of infection. 9/20; patient presents for follow-up. We have been using Hydrofera Blue and [facility] antibiotic ointment. Patient denies signs of infection. PuraPly is available for placement and patient would like to proceed with this. 9/27; patient presents for follow-up. PuraPly #1 was placed in standard fashion to the right foot at last clinic visit. He tolerated this well. Unfortunately has developed a wound to the plantar surface of the left foot. He states he noticed this Sunday. He has pain to the site. He denies purulent drainage increased warmth or erythema. 10/4; patient presents for follow-up. PuraPly #2 was placed in standard fashion to the right foot at last clinic visit. He started taking Augmentin and doxycycline prescribed at last clinic visit due to findings of infection to the left foot. He has been doing Dakin's wet-to-dry to this area. He reports stability in his symptoms. He denies systemic signs of infection. 10/11; patient presents for follow-up. PuraPly #3 was placed in standard  fashion to the right foot at last clinic visit. Unfortunately the clinic did not order another PuraPly for this week. He continues to take Augmentin and doxycycline. He still has pain in the left foot. He had an x-ray that did not show radiographic evidence of osteomyelitis. He still has serous drainage on exam. He is doing Dakin's wet-to-dry dressings to this area. 10/18; patient presents for follow-up. He has been using Hydrofera Blue and [facility] to the right foot. PuraPly is available for placement today. He has been taking levofloxacin for the past week. He continues to have pain in the left foot with increased redness and swelling. An MRI was ordered at last clinic visit this has not been done yet. He has been doing Dakin's wet-to-dry dressings to the left foot. 10/25; since the patient was last here he was admitted to hospital from 1018 through 10/20. He underwent a transmetatarsal amputation by Dr. Allena Katz of podiatry. He received vancomycin and Rocephin in the hospital and was discharged on 10 days of doxycycline. He has a follow-up with Dr. Allena Katz this week on [place], [place] ([id number]) [id number] Page 8 of 10 Friday he is not changing the dressing. The wound we are following is on the plantar right foot between the first and second metatarsals. We applied puraply #5 today 11/1; patient came into the clinic today with a note from Dr. Allena Katz at [facility] asking [place] to look at the left foot and consider removing the sutures. Otherwise the area on his  right foot that we have been applying Puraply to looks really very healthy we applied Puraply #6 Objective Constitutional Sitting or standing Blood Pressure is within target range for patient.. Pulse regular and within target range for patient.[name] Respirations regular, non-labored and within target range.. Temperature is normal and within the target range for the patient.[name] appears in no distress. Vitals Time  Taken: 8:11 AM, Height: 74 in, Weight: 226 lbs, BMI: 29, Temperature: 97.9 F, Pulse: 98 bpm, Respiratory Rate: 18 breaths/min, Blood Pressure: 125/81 mmHg. General Notes: Wound exam oo Right foot plantar aspect. This looks very healthy healthy granulation no evidence of infection. Wound is remarkably smaller. Some maceration around the wound but nothing dramatic. oo Left TMA site. Both sides of this wound actually look well adhered however the middle aspect not quite together. The tissue here looks and feels reasonable there is no probing depth and no evidence of infection Integumentary (Hair, Skin) Wound #1 status is Open. Original cause of wound was Blister. The date acquired was: 07/15/2020. The wound has been in treatment 42 weeks. The wound is located on the [place]. The wound measures 0.5cm length x 0.7cm width x 0.3cm depth; 0.275cm^2 area and 0.082cm^3 volume. There is Fat Layer (Subcutaneous Tissue) exposed. There is no tunneling or undermining noted. There is a medium amount of serosanguineous drainage noted. The wound margin is thickened. There is large (67-100%) red granulation within the wound bed. There is a small (1-33%) amount of necrotic tissue within the wound bed including Adherent Slough. 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 A skin graft procedure using a bioengineered skin substitute/cellular or tissue based product was performed by Ricard Dillon, MD with the following instrument(s): Forceps and Scissors. Puraply AM was applied and  secured with Steri-Strips and Mepitel. 4 sq cm of product was utilized and 0 sq cm was wasted. Post Application, Bolster was applied. A Time Out was conducted at 08:33, prior to the start of the procedure. The procedure was tolerated well. Post procedure Diagnosis Wound #1: Same as Pre-Procedure . Plan Follow-up Appointments: Return Appointment in 1 week. Nurse Visit as needed Bathing/ Shower/ Hygiene: May shower; gently cleanse wound with antibacterial soap, rinse and pat dry prior to dressing wounds No tub bath. Anesthetic (Use 'Patient Medications' Section for Anesthetic Order Entry): Lidocaine applied to wound bed Cellular or Tissue Based Products: Cellular or Tissue Based Product Type: - Puraply application #5 applied right foot Edema Control - Lymphedema / Segmental Compressive Device / Other: Elevate, Exercise Daily and Avoid Standing for Long Periods of Time. Elevate leg(s) parallel to the floor when sitting. DO YOUR BEST to sleep in the bed at night. DO NOT sleep in your recliner. Long hours of sitting in a recliner leads to swelling of the legs and/or potential wounds on your backside. SONY, SCHLARB ([id number]) [id number] Page 9 of 10 Off-Loading: Foot Defender  - right foot-keep shin covered-keep pressure off of the wound (Patient is not wear boot to clinic each week. Wearing tennis shoe) Additional Orders / Instructions: Follow Nutritious Diet and Increase Protein Intake - monitor blood sugar to maintain normal limits Other: - Follow  up post surgery for left foot-[facility] handling WOUND #1: - Foot Wound Laterality: Plantar, Right Cleanser: Wound Cleanser Every Other Day/30 Days Discharge Instructions: Wash your hands with soap and water. Remove old dressing, discard into plastic bag and place into trash. Cleanse the wound with Wound Cleanser prior to applying a clean dressing using gauze sponges, not tissues or cotton balls. Do not  scrub or use excessive force. Pat dry using gauze sponges, not tissue or cotton balls. Prim Dressing: PuraPly Every Other Day/30 Days ary Secondary Dressing: Zetuvit Plus 4x4 (in/in) (Generic) Every Other Day/30 Days Secured With: Conform 4'' - Conforming Stretch Gauze Bandage 4x75 (in/in) Every Other Day/30 Days Discharge Instructions: Apply as directed 1. We reapplied puraply #6 in a standard fashion nice improvement in the wound on the right plantar foot 2. Although the sutures on both sides of this left TMA site could be removed the area in the center I was a little more concerned about therefore I did not remove any of the sutures but I think this could be done next week. We will use silver alginate over the center part of this wound for antibacterial effect, absorption drying etc. 3 he has new diabetic shoes in the eventuality that the areas on his feet close. These are not the shoes that he was wearing when the wounds occurred initially. 4. He had osteomyelitis in the left foot and he is asking me today why HBO was not considered. I did not look back on these records Electronic Signature(s) Signed: 08/31/2022 3:59:15 PM By: Baltazar Najjar MD Entered By: Baltazar Najjar on 08/31/2022 08:44:16 -------------------------------------------------------------------------------- SuperBill Details Patient Name: Date of Service: [name] 08/31/2022 Medical Record Number: [id number] Patient Account Number: [id number] Date of Birth/Sex: Treating RN: Apr 20, 1956 (66 y.o. Loel Lofty, Selena Batten Primary Care Provider: Manson Allan Other Clinician: Betha Loa Referring Provider: Treating Provider/Extender: RO BSO N, MICHA [name] Merlyn Albert, Glenda Weeks in Treatment: 42 Diagnosis Coding ICD-10 Codes Code Description [phone] 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ed E11.621 Type 2 diabetes mellitus with foot ulcer E11.40 Type 2 diabetes mellitus with diabetic  neuropathy, unspecified R26.89 Other abnormalities of gait and mobility Facility Procedures : CPT4 Code: [id number] Description: 15275 - SKIN SUB GRAFT FACE/NK/HF/G ICD-10 Diagnosis Description L97.512 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ed Modifier: Quantity: 1 : CPT4 Code: 62130865 Description: Q4196 PuraPly Product AM 2X2 (4sq CM) ICD-10 Diagnosis Description L97.512 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ed Modifier: Quantity: 4 Physician Procedures : CPT4 Code Description OCIE, TINO ([id number]) [id number] 15275 - WC PHYS SKIN SUB GRAFT FACE/NK/HF/G 1 ICD-10 Diagnosis Description L97.512 Non-pressure chronic ulcer of other part of right foot with fat  layer exposed Quantity: Page 10 of 10 Electronic Signature(s) Signed: 08/31/2022 3:59:15 PM By: Baltazar Najjar MD Entered By: Baltazar Najjar on 08/31/2022 08:44:27

## 2022-09-07 ENCOUNTER — Encounter (HOSPITAL_BASED_OUTPATIENT_CLINIC_OR_DEPARTMENT_OTHER): Payer: Medicare Other | Admitting: Internal Medicine

## 2022-09-07 DIAGNOSIS — E11621 Type 2 diabetes mellitus with foot ulcer: Secondary | ICD-10-CM | POA: Diagnosis not present

## 2022-09-07 DIAGNOSIS — L97512 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ed: Secondary | ICD-10-CM

## 2022-09-08 NOTE — Progress Notes (Signed)
ARVO, EALY ([id number]) [id number] Page 1 of 11 Visit Report for 09/07/2022 Chief Complaint Document Details Patient Name: Date of Service: [name], [name] 09/07/2022 8:15 A M Medical Record Number: [id number] Patient Account Number: [id number] Date of Birth/Sex: Treating RN: 1956/06/14 (66 y.o. [name] Primary Care Provider: Manson Allan Other Clinician: Betha Loa Referring Provider: Treating Provider/Extender: Collins Scotland Weeks in Treatment: 66 Information Obtained from: Patient Chief Complaint Right plantar foot wound Electronic Signature(s) Signed: 09/07/2022 2:02:44 PM By: Geralyn Corwin DO Entered By: Geralyn Corwin on 09/07/2022 09:08:31 -------------------------------------------------------------------------------- Cellular or Tissue Based Product Details Patient Name: Date of Service: [name], [name] 09/07/2022 8:15 A M Medical Record Number: [id number] Patient Account Number: [id number] Date of Birth/Sex: Treating RN: 1956/10/08 (66 y.o. [name] Primary Care Provider: Manson Allan Other Clinician: Betha Loa Referring Provider: Treating Provider/Extender: Collins Scotland Weeks in Treatment: 29 Cellular or Tissue Based Product Type Wound #1 Right,Plantar Foot Applied to: Performed By: Physician Geralyn Corwin, MD Cellular or Tissue Based Product Type: Puraply AM Level of Consciousness (Pre-procedure): Awake and Alert Pre-procedure Verification/Time Out Yes - 08:57 Taken: Location: genitalia / hands / feet / multiple digits Wound Size (sq cm): 0.01 Product Size (sq cm): 4 Waste Size (sq cm): 2 Waste Reason: wound size Amount of Product Applied (sq cm): 2 Instrument Used: Forceps, Scissors Lot #: [id number] Order #: 6 Expiration Date: 11/16/2024 Fenestrated: No GLEB, MCGUIRE ([id number]) [id number] Page 2 of 11 Reconstituted:  No Secured: Yes Secured With: Steri-Strips, adaptic Dressing Applied: Yes Primary Dressing: Bolster Response to Treatment: Procedure was tolerated well Level of Consciousness (Post- Awake and Alert procedure): Post Procedure Diagnosis Same as Pre-procedure Electronic Signature(s) Signed: 09/08/2022 12:52:59 PM By: Betha Loa Entered By: Betha Loa on 09/07/2022 09:00:52 -------------------------------------------------------------------------------- Debridement Details Patient Name: Date of Service: [name] 09/07/2022 8:15 A M Medical Record Number: [id number] Patient Account Number: [id number] Date of Birth/Sex: Treating RN: 03-12-1956 (66 y.o. Loel Lofty, Selena Batten Primary Care Provider: Manson Allan Other Clinician: Betha Loa Referring Provider: Treating Provider/Extender: Collins Scotland Weeks in Treatment: 43 Debridement Performed for Assessment: Wound #1 Right,Plantar Foot Performed By: Physician Geralyn Corwin, MD Debridement Type: Debridement Severity of Tissue Pre Debridement: Fat layer exposed Level of Consciousness (Pre-procedure): Awake and Alert Pre-procedure Verification/Time Out Yes - 08:51 Taken: Start Time: 08:51 T Area Debrided (L x W): otal 0.3 (cm) x 0.3 (cm) = 0.09 (cm) Tissue and other material debrided: Viable, Non-Viable, Callus, Skin: Dermis , Skin: Epidermis Level: Skin/Epidermis Debridement Description: Selective/Open Wound Instrument: Curette Bleeding: None End Time: 08:55 Response to Treatment: Procedure was tolerated well Level of Consciousness (Post- Awake and Alert procedure): Post Debridement Measurements of Total Wound Length: (cm) 0.2 Width: (cm) 0.1 Depth: (cm) 0.1 Volume: (cm) 0.002 Character of Wound/Ulcer Post Debridement: Stable Severity of Tissue Post Debridement: Fat layer exposed Post Procedure Diagnosis Same as Pre-procedure Electronic Signature(s) Signed: 09/07/2022 2:01:12 PM By:  Elliot Gurney, BSN, RN, CWS, Kim RN, BSN Signed: 09/07/2022 2:02:44 PM By: Geralyn Corwin DO Signed: 09/08/2022 12:52:59 PM By: Betha Loa Entered By: Betha Loa on 09/07/2022 08:55:26 [name] ([id number]) [id number] Page 3 of 11 -------------------------------------------------------------------------------- HPI Details Patient Name: Date of Service: [name] 09/07/2022 8:15 A M Medical Record Number: [id number] Patient Account Number: [id number] Date of Birth/Sex: Treating RN: 04/26/1956 (66 y.o. [name] Primary Care Provider: Manson Allan Other Clinician: Betha Loa Referring Provider: Treating Provider/Extender: Collins Scotland Weeks in Treatment: 43 History of Present Illness HPI  Description: Admission 11/10/2021 [name] is a 66 year old male with a past medical history of uncontrolled type 2 diabetes with last hemoglobin A1c of 8.1 complicated by peripheral neuropathy that presents to the clinic for a 2-year history of nonhealing ulcer to the bottom of his right foot. He states this started out as a blister caused by a work boot. He reports receiving wound care when he resided in [place]. He is reestablishing his wound care in [name] today. He is currently keeping the area clean and covered. He has insoles designed to help offload the wound bed. He currently denies signs of infection. 1/18; patient presents for follow-up. He has been using Hydrofera Blue to the wound bed. He has no issues or complaints today. He has not received the defender.[name] He denies signs of infection. 2/1; patient presents for follow-up. He has been using Hydrofera Blue to the wound bed he states he received the defender boot and has been using it however he did not have it on today. He currently denies signs of infection to the right foot. Unfortunately he developed a wound to the left great toe over the past week.  He states he received new orthotics and these caused a blister to the left great toe which turned into a wound. He has not been doing anything to the wound bed. He currently denies systemic signs of infection. 2/8; Patient presents for follow-up. 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 He states he is about 90% recovered. He has some mild weakness to the left side. He reports taking the antibiotics prescribed at last clinic visit. He reports using Hydrofera Blue for dressing changes. He only uses the offloading boot when he is at home. He uses open toed shoes to the right foot. He currently denies signs of infection. 2/15; patient presents for follow-up. He states he is still taking the antibiotics prescribed. He reports using the defender boot to the right foot and open toed shoe to the left foot however he is not wearing either today. He currently denies systemic signs of infection. He stated he cut down a tree last week and was outside doing yard work. 2/22; patient presents for follow-up. 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 He reports feeling well. He reports using the defender boot to the right leg and continues to use Hydrofera Blue with dressing changes. He denies systemic signs of infection. 3/8; patient presents for follow-up. He sees Dr. Alberteen Spindle tomorrow for follow-up of the left great toe amputation. This was a partial amputation. He reports he is taking Augmentin currently. He reports increased erythema to the left great toe amputation site. He also reports a decline in his right plantar foot wound. He reports it is draining more. He denies purulent drainage. 3/15; patient presents for follow-up. He did not see Dr. Alberteen Spindle last week but states he sees him today. He states he has been taking doxycycline in the past week and started  gentamicin ointment. He continues to use [facility] with dressing changes. He currently denies systemic signs of infection. 3/22; patient presents for follow-up. Patient had partial amputation of the left great toe on 2/15. 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 Alberteen Spindle. He reports no issues and has no complaints. He is currently taking Keflex and doxycycline prescribed by  Dr. Alberteen Spindle. He continues to use gentamicin ointment and Hydrofera Blue to the right foot wound. He reports using the defender boot when he is at home. 3/29; patient presents for follow-up. He continues to be on antibiotics per podiatry. He has been using Hydrofera Blue and gentamicin ointment to the right plantar foot wound. He has no issues or complaints today. He denies signs of infection. 4/5; patient presents for follow-up. He has been using Dakin's wet-to-dry dressings with improvement to wound healing. He denies signs of infection. She has no issues or complaints today. 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 Use Hydrofera Blue for a prolonged period of time and has been using Dakin's wet-to-dry I think for the last 2 to 3 weeks. He has a Psychologist, forensic at home but he does not wear that coming into the clinic because he drives. He tells me he has been compliant with the defender boot short of when he goes out to drive. He lives alone. He also tells me he has had a previous left great toe amputation We are following him for the right foot wound. 4/26; patient presents for follow-up. He had worsening of his left foot wound and had surgical debridement on 4/16 by Dr. Fanny Skates. He is being followed by podiatry for this issue. He he is on IV Unasyn based on tissue culture by ID.[name] We are following him for the right foot wound. He has been using Dakin's wet-to- dry dressings without issues. He reports continued usage of the defender boot. 5/3; patient presents for follow-up.  He has been using Dakin's wet-to-dry dressings to the right foot wound. He has no issues or complaints today. He reports using the Psychologist, forensic. He reports offloading the foot wound when he is at home by sitting in a recliner. He denies signs of infection. He is still getting IV antibiotics For the left foot wound that is being followed by podiatry. He states the end date is later this month on 5/28. 5/17; patient presents for follow-up. He missed his last clinic appointment. He has been using Dakin's wet-to-dry dressings. He states he is offloading the right foot wound with the defender boot although he does not have this today. He is still on IV antibiotics. He has no issues or complaints today. 5/24; patient presents for follow-up. He has been using Hydrofera Blue to the wound bed without issues. He denies signs of infection. At last clinic visit he had Lobos, Friedrich ([id number]) [id number] Page 4 of 11 a wound culture done that detected high levels of Enterococcus faecalis and low levels of Corynebacterium stratum and Staphylococcus epidermidis. [facility] antibiotics were ordered. He has not heard from the company yet. 5/31; patient presents for follow-up. He continues to use Hydrofera Blue to the wound bed. He denies signs of infection. Keystone antibiotics were ordered at last clinic visit and he states he called the company to pay for it. He states that it is being shipped. We rediscussed the total contact cast and he declines having this placed today. He states he is wearing the defender boot however he never wears it into the office. 04-06-2022 upon evaluation today patient appears to be doing about the same in regard to his wound. This is not significantly smaller. He is not wearing his offloading boot. We discussed that today he tells me that he wears it "all the time as he chuckled and does not have it on during the office visit today. I think it is increasingly  obvious that he is just messing around when he says this based on what I am seeing and otherwise I do not know why he would wear it all the time but not to the clinic. Either way my opinion based on what I am seeing currently is simply that the wound does seem to be still having quite a bit of callus buildup around the edges it is also very dry. He does tell me that he has been using the [facility] since he got it and to be honest I think this is good and should hopefully help keep things under control but at the same time I do not see any evidence of active infection. Again the Jodie Echevaria is a topical antibiotic with multiple compounds to help prevent further infection and worsening overall. 6/14; patient presents for follow-up. He has been using Keystone antibiotics with [employer]	[place] daily. He states he is wearing his offloading boot. He does not have this in office today. He is going to the beach for the next 5 days. He states he Will not be using using any offloading device During this time. I again offered the total contact cast however he declined. I did ask him to reconsider for next clinic visit. He currently denies signs of infection. 6/21; patient presents for follow-up. He went to the beach last week. He did not use an offloading device. Wound is slightly deeper today. I again offered the total contact cast but he declined. Currently denies signs of infection. He reports using the [place], hydrofera blue and the Psychologist, forensic. 6/28; patient presents for follow-up. Patient declines total contact cast today. He has been using Keystone and [facility] to the wound bed. It is unclear if he is using the Psychologist, forensic. He would like a letter for the post man to delivers mail to his front door so he does not have to walk down the driveway. 7/5; patient presents for follow-up. He states he is considering the total contact cast however does not want this placed today. He is actually going to the  gym after this appointment. He does not want offloading foot wear for this. He has been using Keystone and [facility] to the wound bed. He denies signs of infection. 7/12; patient presents for follow-up. He is still considering the total contact cast however does not want this today. He has been using Keystone antibiotic and Hydrofera Blue to the wound bed. He denies signs of infection. He has no issues or complaints today. 7/26; patient presents for follow-up. He declines a total contact cast. He has been using his an antibiotic and Hydrofera Blue to the wound bed. He denies systemic signs of infection. He is scheduled to discuss orthotics next week with his podiatrist. 8/2; patient presents for follow-up. He has been using Hydrofera Blue to the wound bed. He has no issues or complaints today. He declines a total contact cast. He states he is getting orthotics today. 8/9; patient presents for follow-up. He has been using Hydrofera Blue to the wound bed. He again declines the total contact cast. He does state that he is considering starting it at the end of the month. He has a vacation coming up and does not want to have it in place for that. 8/16; patient presents for follow-up. He has been using Hydrofera Blue and Keystone to the wound bed. He started taking Augmentin and doxycycline as prescribed. He denies signs of infection. He is going on his vacation next week and will  be back in 2 weeks. He states he is considering the total contact cast for when he comes back from his trip. 8/30; patient present for follow-up. He has been using Hydrofera Blue and Keystone to the wound bed. He continues to take doxycycline and Augmentin without issues. He went on vacation and reports walking for long periods of time without pressure relief to the wound bed. He currently denies systemic signs of infection. 9/6; patient presents for follow-up. He has been using Hydrofera Blue and Keystone to the wound bed.  He is not wearing his offloading boot. He declines a total contact cast. He has finished Augmentin and doxycycline. He had an MRI completed on 07/01/2022 that did not show an underlying abscess, septic arthritis or osteomyelitis. He currently denies systemic signs of infection. 9/13; patient presents for follow-up. Has been using Hydrofera Blue and Keystone to the wound bed. He does not present with his offloading boot. He declines the total contact cast. He has been approved for PuraPly. We discussed this today and he would like this placed at next clinic visit. He denies signs of infection. 9/20; patient presents for follow-up. We have been using Hydrofera Blue and Keystone antibiotic ointment. Patient denies signs of infection. PuraPly is available for placement and patient would like to proceed with this. 9/27; patient presents for follow-up. PuraPly #1 was placed in standard fashion to the right foot at last clinic visit. He tolerated this well. Unfortunately has developed a wound to the plantar surface of the left foot. He states he noticed this Sunday. He has pain to the site. He denies purulent drainage increased warmth or erythema. 10/4; patient presents for follow-up. PuraPly #2 was placed in standard fashion to the right foot at last clinic visit. He started taking Augmentin and doxycycline prescribed at last clinic visit due to findings of infection to the left foot. He has been doing Dakin's wet-to-dry to this area. He reports stability in his symptoms. He denies systemic signs of infection. 10/11; patient presents for follow-up. PuraPly #3 was placed in standard fashion to the right foot at last clinic visit. Unfortunately the clinic did not order another PuraPly for this week. He continues to take Augmentin and doxycycline. He still has pain in the left foot. He had an x-ray that did not show radiographic evidence of osteomyelitis. He still has serous drainage on exam. He is doing  Dakin's wet-to-dry dressings to this area. 10/18; patient presents for follow-up. He has been using Hydrofera Blue and Keystone to the right foot. PuraPly is available for placement today. He has been taking levofloxacin for the past week. He continues to have pain in the left foot with increased redness and swelling. An MRI was ordered at last clinic visit this has not been done yet. He has been doing Dakin's wet-to-dry dressings to the left foot. 10/25; since the patient was last here he was admitted to hospital from 1018 through 10/20. He underwent a transmetatarsal amputation by Dr. Allena Katz of podiatry. He received vancomycin and Rocephin in the hospital and was discharged on 10 days of doxycycline. He has a follow-up with Dr. Allena Katz this week on Friday he is not changing the dressing. The wound we are following is on the plantar right foot between the first and second metatarsals. We applied puraply #5 today 11/1; patient came into the clinic today with a note from Dr. Allena Katz at [facility] asking [place] to look at the left foot and consider removing the sutures.  Otherwise the area on his right foot that we have been applying Puraply to looks really very healthy we applied Puraply #6 11/8; patient presents for follow-up. Patient Has not had follow-up with Dr. Allena Katz. We have been applying PuraPly to the right foot. He continues to show improvement in wound healing. Electronic Signature(s) Signed: 09/07/2022 2:02:44 PM By: Kelby Fam ([id number]) [id number] Page 5 of 11 Entered By: Geralyn Corwin on 09/07/2022 09:11:17 -------------------------------------------------------------------------------- Physical Exam Details Patient Name: Date of Service: [name], [name] 09/07/2022 8:15 A M Medical Record Number: [id number] Patient Account Number: [id number] Date of Birth/Sex: Treating RN: [birth year] (66 y.o. [name] Primary Care  Provider: Manson Allan Other Clinician: Betha Loa Referring Provider: Treating Provider/Extender: Collins Scotland Weeks in Treatment: 85 Constitutional . Cardiovascular . Psychiatric . Notes Right plantar foot with small open wound with granulation tissue. Epithelization to the edges circumferentially. Left TMA with sutures and stitches in place. The edges are adhering however middle is still appears to be coming together. No signs of surrounding infection. Electronic Signature(s) Signed: 09/07/2022 2:02:44 PM By: Geralyn Corwin DO Entered By: Geralyn Corwin on 09/07/2022 09:12:45 -------------------------------------------------------------------------------- Physician Orders Details Patient Name: Date of Service: [name] 09/07/2022 8:15 A M Medical Record Number: [id number] Patient Account Number: [id number] Date of Birth/Sex: Treating RN: 1956-05-07 (66 y.o. [name] Primary Care Provider: Manson Allan Other Clinician: Betha Loa Referring Provider: Treating Provider/Extender: Collins Scotland Weeks in Treatment: 27 Verbal / Phone Orders: No Diagnosis Coding Follow-up Appointments Return Appointment in 1 week. Nurse Visit as needed Bathing/ Shower/ Hygiene May shower; gently cleanse wound with antibacterial soap, rinse and pat dry prior to dressing wounds No tub bath. NEEMA, FLUEGGE ([id number]) [id number] Page 6 of 11 Anesthetic (Use 'Patient Medications' Section for Anesthetic Order Entry) Lidocaine applied to wound bed Cellular or Tissue Based Products Cellular or Tissue Based Product Type: - Puraply application #6 applied right foot Edema Control - Lymphedema / Segmental Compressive Device / Other Elevate, Exercise Daily and A void Standing for Long Periods of Time. Elevate leg(s) parallel to the floor when sitting. DO YOUR BEST to sleep in the bed at night. DO NOT sleep in your  recliner. Long hours of sitting in a recliner leads to swelling of the legs and/or potential wounds on your backside. Off-Loading Foot Defender - right foot-keep shin covered-keep pressure off of the wound (Patient is not wear boot to clinic each week. Wearing tennis shoe) Additional Orders / Instructions Follow Nutritious Diet and Increase Protein Intake - monitor blood sugar to maintain normal limits Other: - Follow up post surgery for left foot-[facility] handling Wound Treatment Wound #1 - Foot Wound Laterality: Plantar, Right Cleanser: Wound Cleanser Every Other Day/30 Days Discharge Instructions: Wash your hands with soap and water. Remove old dressing, discard into plastic bag and place into trash. Cleanse the wound with Wound Cleanser prior to applying a clean dressing using gauze sponges, not tissues or cotton balls. Do not scrub or use excessive force. Pat dry using gauze sponges, not tissue or cotton balls. Prim Dressing: PuraPly ary Every Other Day/30 Days Secondary Dressing: Zetuvit Plus 4x4 (in/in) (Generic) Every Other Day/30 Days Secured With: Conform 4'' - Conforming Stretch Gauze Bandage 4x75 (in/in) Every Other Day/30 Days Discharge Instructions: Apply as directed Electronic Signature(s) Signed: 09/07/2022 2:02:44 PM By: Geralyn Corwin DO Entered By: Geralyn Corwin on 09/07/2022 09:16:10 -------------------------------------------------------------------------------- Problem List Details Patient Name: Date of Service: [name] 09/07/2022  8:15 A M Medical Record Number: [id number] Patient Account Number: [id number] Date of Birth/Sex: Treating RN: [birth year] (66 y.o. [name] HolmsM) Woody, Kim Primary Care Provider: Manson AllanFields, Glenda Other Clinician: Betha LoaVenable, Angie Referring Provider: Treating Provider/Extender: Collins ScotlandHoffman, Coal [place] Fields, Glenda Weeks in Treatment: 6243 Active Problems ICD-10 Encounter Code Description Active Date MDM Diagnosis L97.512 Non-pressure  chronic ulcer of other part of right foot with fat layer exposed 11/10/2021 No Yes E11.621 Type 2 diabetes mellitus with foot ulcer 11/10/2021 No Yes E11.40 Type 2 diabetes mellitus with diabetic neuropathy, unspecified 11/10/2021 No Yes [name], [name] ([id number]) [id number] Page 7 of 11 R26.89 Other abnormalities of gait and mobility 11/10/2021 No Yes S98.022S 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, sequela 08/31/2022 No Yes Inactive Problems ICD-10 Code Description Active Date Inactive Date L97.528 Non-pressure chronic ulcer of other part of left foot with other specified severity 12/01/2021 12/01/2021 S91.302A Unspecified open wound, left foot, initial encounter 07/27/2022 07/27/2022 M79.672 Pain in left foot 08/10/2022 08/10/2022 Resolved Problems Electronic Signature(s) Signed: 09/07/2022 2:02:44 PM By: Geralyn CorwinHoffman, Nobel Brar DO Entered By: Geralyn CorwinHoffman, Hildegard Hlavac on 09/07/2022 09:08:26 -------------------------------------------------------------------------------- Progress Note Details Patient Name: Date of Service: [name] MealyNEELY, MICHA [name] 09/07/2022 8:15 A M Medical Record Number: [id number] Patient Account Number: [id number] Date of Birth/Sex: Treating RN: [birth year] (66 y.o. [name] HolmsM) Woody, Kim Primary Care Provider: Manson AllanFields, Glenda Other Clinician: Betha LoaVenable, Angie Referring Provider: Treating Provider/Extender: Collins ScotlandHoffman, Savier Trickett Fields, Glenda Weeks in Treatment: 2143 Subjective Chief Complaint Information obtained from Patient Right plantar foot wound History of Present Illness (HPI) Admission 11/10/2021 Mr. Jarome MatinMichael Mifflin is a [age] year old male with a past medical history of uncontrolled type 2 diabetes with last hemoglobin A1c of 8.1 complicated by peripheral neuropathy that presents to the clinic for a 2-year history of nonhealing ulcer to the bottom of his right foot. He states this started out as a blister caused by a work boot. He reports receiving wound care when  [place] [facility] [place] [facility] [place]. He is currently keeping the area clean and covered. He has insoles designed to help offload the wound bed. He currently denies signs of infection. 1/18; patient presents for follow-up. He has been using Hydrofera Blue to the wound bed. He has no issues or complaints today. He has not received the defender.[name]. He denies signs of infection. 2/1; patient presents for follow-up. He has been using Hydrofera Blue to the wound bed he states he received the defender boot and has been using it however he did not have it on today. He currently denies signs of infection to the right foot. Unfortunately he developed a wound to the left great toe over the past week. He states he received new orthotics and these caused a blister to the left great toe which turned into a wound. He has not been doing anything to the wound bed. He currently denies systemic signs of infection. 2/8; Patient presents for follow-up. 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 He states he is about 90% recovered. He has some mild weakness to the left side. He reports taking the antibiotics Hiney, Da ([id number]) [id number] Page 8 of 11 prescribed at last clinic visit. He reports using Hydrofera Blue for dressing changes. He only uses the offloading boot when he is at home. He uses open toed shoes to the right foot. He currently denies signs of infection. 2/15;  patient presents for follow-up. He states he is still taking the antibiotics prescribed. He reports using the defender boot to the right foot and open toed shoe to the left foot however he is not wearing either today. He currently denies systemic signs of infection. He stated he cut down a tree last week and was outside doing yard work. 2/22;  patient presents for follow-up. 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 He reports feeling well. He reports using the defender boot to the right leg and continues to use Hydrofera Blue with dressing changes. He denies systemic signs of infection. 3/8; patient presents for follow-up. He sees Dr. Alberteen Spindle tomorrow for follow-up of the left great toe amputation. This was a partial amputation. He reports he is taking Augmentin currently. He reports increased erythema to the left great toe amputation site. He also reports a decline in his right plantar foot wound. He reports it is draining more. He denies purulent drainage. 3/15; patient presents for follow-up. He did not see Dr. Alberteen Spindle last week but states he sees him today. He states he has been taking doxycycline in the past week and started gentamicin ointment. He continues to use [facility] with dressing changes. He currently denies systemic signs of infection. 3/22; patient presents for follow-up. Patient had partial amputation of the left great toe on 2/15. 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 Alberteen Spindle. He reports no issues and has no complaints. He is currently taking Keflex and doxycycline prescribed by Dr. Alberteen Spindle. He continues to use gentamicin ointment and Hydrofera Blue to the right foot wound. He reports using the defender boot when he is at home. 3/29; patient presents for follow-up. He continues to be on antibiotics per podiatry. He has been using Hydrofera Blue and gentamicin ointment to the right plantar foot wound. He has no issues or complaints today. He denies signs of infection. 4/5; patient presents for follow-up. He has been using Dakin's wet-to-dry dressings with improvement to wound healing. He denies signs of infection. She has no issues or complaints today. 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 Use Hydrofera Blue for a prolonged period of time  and has been using Dakin's wet-to-dry I think for the last 2 to 3 weeks. He has a Psychologist, forensic at home but he does not wear that coming into the clinic because he drives. He tells me he has been compliant with the defender boot short of when he goes out to drive. He lives alone. He also tells me he has had a previous left great toe amputation We are following him for the right foot wound. 4/26; patient presents for follow-up. He had worsening of his left foot wound and had surgical debridement on 4/16 by Dr. Fanny Skates. He is being followed by podiatry for this issue. He he is on IV Unasyn based on tissue culture by ID.[name] We are following him for the right foot wound. He has been using Dakin's wet-to- dry dressings without issues. He reports continued usage of the defender boot. 5/3; patient presents for follow-up. He has been using Dakin's wet-to-dry dressings to the right foot wound. He has no issues or complaints today. He reports using the Psychologist, forensic. He reports offloading the foot wound when he is at home by sitting in a recliner. He denies signs of infection. He is still getting IV antibiotics For the left foot wound that is being followed by podiatry. He  states the end date is later this month on 5/28. 5/17; patient presents for follow-up. He missed his last clinic appointment. He has been using Dakin's wet-to-dry dressings. He states he is offloading the right foot wound with the defender boot although he does not have this today. He is still on IV antibiotics. He has no issues or complaints today. 5/24; patient presents for follow-up. He has been using Hydrofera Blue to the wound bed without issues. He denies signs of infection. At last clinic visit he had a wound culture done that detected high levels of Enterococcus faecalis and low levels of Corynebacterium stratum and Staphylococcus epidermidis. [facility] antibiotics were ordered. He has not heard from the company yet. 5/31; patient  presents for follow-up. He continues to use Hydrofera Blue to the wound bed. He denies signs of infection. [facility] antibiotics were ordered at last clinic visit and he states he called the company to pay for it. He states that it is being shipped. We rediscussed the total contact cast and he declines having this placed today. He states he is wearing the defender boot however he never wears it into the office. 04-06-2022 upon evaluation today patient appears to be doing about the same in regard to his wound. This is not significantly smaller. He is not wearing his offloading boot. We discussed that today he tells me that he wears it "all the time as he chuckled and does not have it on during the office visit today. I think it is increasingly obvious that he is just messing around when he says this based on what I am seeing and otherwise I do not know why he would wear it all the time but not to the clinic. Either way my opinion based on what I am seeing currently is simply that the wound does seem to be still having quite a bit of callus buildup around the edges it is also very dry. He does tell me that he has been using the [facility] since he got it and to be honest I think this is good and should hopefully help keep things under control but at the same time I do not see any evidence of active infection. Again the Jodie Echevaria is a topical antibiotic with multiple compounds to help prevent further infection and worsening overall. 6/14; patient presents for follow-up. He has been using [facility] antibiotics with [employer]	[place] daily. He states he is wearing his offloading boot. He does not have this in office today. He is going to the beach for the next 5 days. He states he Will not be using using any offloading device During this time. I again offered the total contact cast however he declined. I did ask him to reconsider for next clinic visit. He currently denies signs of infection. 6/21; patient presents for  follow-up. He went to the beach last week. He did not use an offloading device. Wound is slightly deeper today. I again offered the total contact cast but he declined. Currently denies signs of infection. He reports using the [place], hydrofera blue and the Psychologist, forensic. 6/28; patient presents for follow-up. Patient declines total contact cast today. He has been using Key[facility] and [facility] to the wound bed. It is unclear if he is using the Psychologist, forensic. He would like a letter for the post man to delivers mail to his front door so he does not have to walk down the driveway. 7/5; patient presents for follow-up. He states he is considering the total contact  cast however does not want this placed today. He is actually going to the gym after this appointment. He does not want offloading foot wear for this. He has been using [facility] and [facility] to the wound bed. He denies signs of infection. 7/12; patient presents for follow-up. He is still considering the total contact cast however does not want this today. He has been using Keystone antibiotic and Hydrofera Blue to the wound bed. He denies signs of infection. He has no issues or complaints today. 7/26; patient presents for follow-up. He declines a total contact cast. He has been using his an antibiotic and Hydrofera Blue to the wound bed. He denies systemic signs of infection. He is scheduled to discuss orthotics next week with his podiatrist. 8/2; patient presents for follow-up. He has been using Hydrofera Blue to the wound bed. He has no issues or complaints today. He declines a total contact cast. He states he is getting orthotics today. 8/9; patient presents for follow-up. He has been using Hydrofera Blue to the wound bed. He again declines the total contact cast. He does state that he is considering starting it at the end of the month. He has a vacation coming up and does not want to have it in place for that. 8/16; patient presents  for follow-up. He has been using Hydrofera Blue and [facility] to the wound bed. He started taking Augmentin and doxycycline as prescribed. He denies signs of infection. He is going on his vacation next week and will be back in 2 weeks. He states he is considering the total contact cast for when he comes back from his trip. 8/30; patient present for follow-up. He has been using Hydrofera Blue and [facility] to the wound bed. He continues to take doxycycline and Augmentin without issues. He went on vacation and reports walking for long periods of time without pressure relief to the wound bed. He currently denies systemic signs [name], [name] ([id number]) [id number] Page 9 of 11 of infection. 9/6; patient presents for follow-up. He has been using Hydrofera Blue and [facility] to the wound bed. He is not wearing his offloading boot. He declines a total contact cast. He has finished Augmentin and doxycycline. He had an MRI completed on 07/01/2022 that did not show an underlying abscess, septic arthritis or osteomyelitis. He currently denies systemic signs of infection. 9/13; patient presents for follow-up. Has been using Hydrofera Blue and [facility] to the wound bed. He does not present with his offloading boot. He declines the total contact cast. He has been approved for PuraPly. We discussed this today and he would like this placed at next clinic visit. He denies signs of infection. 9/20; patient presents for follow-up. We have been using Hydrofera Blue and [facility] antibiotic ointment. Patient denies signs of infection. PuraPly is available for placement and patient would like to proceed with this. 9/27; patient presents for follow-up. PuraPly #1 was placed in standard fashion to the right foot at last clinic visit. He tolerated this well. Unfortunately has developed a wound to the plantar surface of the left foot. He states he noticed this Sunday. He has pain to the site. He denies  purulent drainage increased warmth or erythema. 10/4; patient presents for follow-up. PuraPly #2 was placed in standard fashion to the right foot at last clinic visit. He started taking Augmentin and doxycycline prescribed at last clinic visit due to findings of infection to the left foot. He has been doing Dakin's wet-to-dry to this area. He reports  stability in his symptoms. He denies systemic signs of infection. 10/11; patient presents for follow-up. PuraPly #3 was placed in standard fashion to the right foot at last clinic visit. Unfortunately the clinic did not order another PuraPly for this week. He continues to take Augmentin and doxycycline. He still has pain in the left foot. He had an x-ray that did not show radiographic evidence of osteomyelitis. He still has serous drainage on exam. He is doing Dakin's wet-to-dry dressings to this area. 10/18; patient presents for follow-up. He has been using Hydrofera Blue and [facility] to the right foot. PuraPly is available for placement today. He has been taking levofloxacin for the past week. He continues to have pain in the left foot with increased redness and swelling. An MRI was ordered at last clinic visit this has not been done yet. He has been doing Dakin's wet-to-dry dressings to the left foot. 10/25; since the patient was last here he was admitted to hospital from 1018 through 10/20. He underwent a transmetatarsal amputation by Dr. Allena Katz of podiatry. He received vancomycin and Rocephin in the hospital and was discharged on 10 days of doxycycline. He has a follow-up with Dr. Allena Katz this week on Friday he is not changing the dressing. The wound we are following is on the plantar right foot between the first and second metatarsals. We applied puraply #5 today 11/1; patient came into the clinic today with a note from Dr. Allena Katz at [facility] asking [place] to look at the left foot and consider removing the sutures. Otherwise the area on his  right foot that we have been applying Puraply to looks really very healthy we applied Puraply #6 11/8; patient presents for follow-up. Patient Has not had follow-up with Dr. Allena Katz. We have been applying PuraPly to the right foot. He continues to show improvement in wound healing. Objective Constitutional Vitals Time Taken: 8:10 AM, Height: 74 in, Weight: 226 lbs, BMI: 29, Temperature: 97.6 F, Pulse: 88 bpm, Respiratory Rate: 16 breaths/min, Blood Pressure: 112/74 mmHg. General Notes: Right plantar foot with small open wound with granulation tissue. Epithelization to the edges circumferentially. Left TMA with sutures and stitches in place. The edges are adhering however middle is still appears to be coming together. No signs of surrounding infection. Integumentary (Hair, Skin) Wound #1 status is Open. Original cause of wound was Blister. The date acquired was: 07/15/2020. The wound has been in treatment 43 weeks. The wound is located on the Right,Plantar Foot. The wound measures 0.1cm length x 0.1cm width x 0.1cm depth; 0.008cm^2 area and 0.001cm^3 volume. There is Fat Layer (Subcutaneous Tissue) exposed. There is no tunneling or undermining noted. There is a small amount of serosanguineous drainage noted. The wound margin is thickened. There is large (67-100%) red granulation within the wound bed. There is no necrotic tissue within the wound bed. 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 I debrided nonviable tissue. PuraPly #7 was placed in standard fashion today. Aggressive offloading. I recommended he follow-up with Dr. Allena Katz for postsurgical follow-up of the  left TMA. We will help try and facilitate an appointment for him I tried foot and ankle. Procedures Escudero, Elio ([id number]) [id number] Page 10 of 11 Wound #  1 Pre-procedure diagnosis of Wound #1 is a Diabetic Wound/Ulcer of the Lower Extremity located on the Right,Plantar Foot .Severity of Tissue Pre Debridement is: Fat layer exposed. There was a Selective/Open Wound Skin/Epidermis Debridement with a total area of 0.09 sq cm performed by Geralyn Corwin, MD. With the following instrument(s): Curette to remove Viable and Non-Viable tissue/material. Material removed includes Callus, Skin: Dermis, and Skin: Epidermis. A time out was conducted at 08:51, prior to the start of the procedure. There was no bleeding. The procedure was tolerated well. Post Debridement Measurements: 0.2cm length x 0.1cm width x 0.1cm depth; 0.002cm^3 volume. Character of Wound/Ulcer Post Debridement is stable. Severity of Tissue Post Debridement is: Fat layer exposed. Post procedure Diagnosis Wound #1: Same as Pre-Procedure Pre-procedure diagnosis of Wound #1 is a Diabetic Wound/Ulcer of the Lower Extremity located on the Right,Plantar Foot. A skin graft procedure using a bioengineered skin substitute/cellular or tissue based product was performed by Geralyn Corwin, MD with the following instrument(s): Forceps and Scissors. Puraply AM was applied and secured with Steri-Strips and adaptic. 2 sq cm of product was utilized and 2 sq cm was wasted due to wound size. Post Application, Bolster was applied. A Time Out was conducted at 08:57, prior to the start of the procedure. The procedure was tolerated well. Post procedure Diagnosis Wound #1: Same as Pre-Procedure . Plan Follow-up Appointments: Return Appointment in 1 week. Nurse Visit as needed Bathing/ Shower/ Hygiene: May shower; gently cleanse wound with antibacterial soap, rinse and pat dry prior to dressing wounds No tub  bath. Anesthetic (Use 'Patient Medications' Section for Anesthetic Order Entry): Lidocaine applied to wound bed Cellular or Tissue Based Products: Cellular or Tissue Based Product Type: - Puraply application #6 applied right foot Edema Control - Lymphedema / Segmental Compressive Device / Other: Elevate, Exercise Daily and Avoid Standing for Long Periods of Time. Elevate leg(s) parallel to the floor when sitting. DO YOUR BEST to sleep in the bed at night. DO NOT sleep in your recliner. Long hours of sitting in a recliner leads to swelling of the legs and/or potential wounds on your backside. Off-Loading: Foot Defender  - right foot-keep shin covered-keep pressure off of the wound (Patient is not wear boot to clinic each week. Wearing tennis shoe) Additional Orders / Instructions: Follow Nutritious Diet and Increase Protein Intake - monitor blood sugar to maintain normal limits Other: - Follow up post surgery for left foot-[facility] handling WOUND #1: - Foot Wound Laterality: Plantar, Right Cleanser: Wound Cleanser Every Other Day/30 Days Discharge Instructions: Wash your hands with soap and water. Remove old dressing, discard into plastic bag and place into trash. Cleanse the wound with Wound Cleanser prior to applying a clean dressing using gauze sponges, not tissues or cotton balls. Do not scrub or use excessive force. Pat dry using gauze sponges, not tissue or cotton balls. Prim Dressing: PuraPly Every Other Day/30 Days ary Secondary Dressing: Zetuvit Plus 4x4 (in/in) (Generic) Every Other Day/30 Days Secured With: Conform 4'' - Conforming Stretch Gauze Bandage 4x75 (in/in) Every Other Day/30 Days Discharge Instructions: Apply as directed 1. In office sharp debridement 2. PuraPly #7 placed in standard fashion today to the right foot 3. Aggressive offloading 4. Follow-up with [facility] 5. Follow-up in 1 week Electronic Signature(s) Signed: 09/07/2022 2:02:44 PM By:  Geralyn Corwin DO Entered By: Geralyn Corwin on 09/07/2022 09:15:55 -------------------------------------------------------------------------------- SuperBill Details Patient Name: Date of Service: [name] 09/07/2022 Medical Record Number: [id number] Patient Account Number:  [id number] KADAN, MILLSTEIN ([id number]) [id number] Page 11 of 11 Date of Birth/Sex: Treating RN: 1956/06/08 (66 y.o. Loel Lofty, Selena Batten Primary Care Provider: Manson Allan Other Clinician: Betha Loa Referring Provider: Treating Provider/Extender: Collins Scotland Weeks in Treatment: 38 Diagnosis Coding ICD-10 Codes Code Description [phone] 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ed E11.621 Type 2 diabetes mellitus with foot ulcer E11.40 Type 2 diabetes mellitus with diabetic neuropathy, unspecified R26.89 Other abnormalities of gait and mobility Facility Procedures : CPT4 Code: [id number] Description: 15275 - SKIN SUB GRAFT FACE/NK/HF/G ICD-10 Diagnosis Description L97.512 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ed Modifier: Quantity: 1 : CPT4 Code: [id number] Description: Q4196 PuraPly Product AM 2X2 (4sq CM) ICD-10 Diagnosis Description L97.512 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ed Modifier: Quantity: 2 Physician Procedures : CPT4 Code Description Modifier [id number] 15275 - WC PHYS SKIN SUB GRAFT FACE/NK/HF/G ICD-10 Diagnosis Description L97.512 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ed Quantity: 1 Electronic Signature(s) Signed: 09/07/2022 2:02:44 PM By: Geralyn Corwin DO Entered By: Geralyn Corwin on 09/07/2022 09:16:02

## 2022-09-08 NOTE — Progress Notes (Signed)
OZRO, RUSSETT ([id number]) [id number] Page 1 of 8 Visit Report for 09/07/2022 Arrival Information Details Patient Name: Date of Service: [name], [name] 09/07/2022 8:15 A M Medical Record Number: [id number] Patient Account Number: [id number] Date of Birth/Sex: Treating RN: August 14, 1956 (66 y.o. [name], [name] Primary Care Shelsy Seng: [name] Other Clinician: Massie Kluver Referring Ziyanna Tolin: Treating Maryruth Apple/Extender: Wynell Balloon Weeks in Treatment: 51 Visit Information History Since Last Visit All ordered tests and consults were completed: No Patient Arrived: Ambulatory Added or deleted any medications: No Arrival Time: 08:00 Any new allergies or adverse reactions: No Transfer Assistance: None Had a fall or experienced change in No Patient Requires Transmission-Based Precautions: No activities of daily living that may affect Patient Has Alerts: Yes risk of falls: Patient Alerts: Patient on Blood Thinner Signs or symptoms of abuse/neglect since last visito No DIABETIC Hospitalized since last visit: No Plavix Implantable device outside of the clinic excluding No cellular tissue based products placed in the center since last visit: Pain Present Now: Yes Electronic Signature(s) Signed: 09/08/2022 12:52:59 PM By: Massie Kluver Entered By: Massie Kluver on 09/07/2022 08:09:13 -------------------------------------------------------------------------------- Clinic Level of Care Assessment Details Patient Name: Date of Service: [name], [name] 09/07/2022 8:15 A M Medical Record Number: [id number] Patient Account Number: [id number] Date of Birth/Sex: Treating RN: [birth year] (66 y.o. [name] Primary Care Cyara Devoto: [name] Other Clinician: Massie Kluver Referring Aanika Defoor: Treating Amberlyn Martinezgarcia/Extender: Wynell Balloon Weeks in Treatment: 32 Clinic Level of Care Assessment Items TOOL 1 Quantity  Score _0  - 0 Use when EandM and Procedure is performed on INITIAL visit ASSESSMENTS - Nursing Assessment / Reassessment _1  - 0 General Physical Exam (combine w/ comprehensive assessment (listed just below) when performed on new pt. evals) _2  - 0 Comprehensive Assessment (HX, ROS, Risk Assessments, Wounds Hx, etc.) [name], [name] ([id number]) [id number] Page 2 of 8 ASSESSMENTS - Wound and Skin Assessment / Reassessment _3  - 0 Dermatologic / Skin Assessment (not related to wound area) ASSESSMENTS - Ostomy and/or Continence Assessment and Care _4  - 0 Incontinence Assessment and Management _5  - 0 Ostomy Care Assessment and Management (repouching, etc.) PROCESS - Coordination of Care _6  - 0 Simple Patient / Family Education for ongoing care _7  - 0 Complex (extensive) Patient / Family Education for ongoing care _8  - 0 Staff obtains Programmer, systems, Records, T Results / Process Orders est _9  - 0 Staff telephones HHA, Nursing Homes / Clarify orders / etc _10  - 0 Routine Transfer to another Facility (non-emergent condition) _11  - 0 Routine Hospital Admission (non-emergent condition) _12  - 0 New Admissions / Biomedical engineer / Ordering NPWT Apligraf, etc. , _13  - 0 Emergency Hospital Admission (emergent condition) PROCESS - Special Needs _14  - 0 Pediatric / Minor Patient Management _15  - 0 Isolation Patient Management _16  - 0 Hearing / Language / Visual special needs _17  - 0 Assessment of Community assistance (transportation, D/C planning, etc.) _18  - 0 Additional assistance / Altered mentation _19  - 0 Support Surface(s) Assessment (bed, cushion, seat, etc.) INTERVENTIONS - Miscellaneous _20  - 0 External ear exam _21  - 0 Patient Transfer (multiple staff / Civil Service fast streamer / Similar devices) _22  - 0 Simple Staple / Suture removal (25 or less) _23  - 0 Complex Staple / Suture removal (26 or more) _24  - 0 Hypo/Hyperglycemic Management (do not check if billed  separately) _25  - 0 Ankle / Brachial Index (ABI) - do not check if billed separately Has the patient been seen at the hospital within the last three years: Yes  Total Score: 0 Level Of Care: ____ Electronic Signature(s) Signed: 09/08/2022 12:52:59 PM By: Massie Kluver Entered By: Massie Kluver on 09/07/2022 09:01:14 -------------------------------------------------------------------------------- Encounter Discharge Information Details Patient Name: Date of Service: [name] 09/07/2022 8:15 A M Medical Record Number: [id number] Patient Account Number: [id number] Date of Birth/Sex: Treating RN: Aug 12, 1956 (66 y.o. [name] Primary Care Enya Bureau: [name] Other Clinician: Massie Kluver Referring Karimah Winquist: Treating Janaiyah Blackard/Extender: Wynell Balloon Weeks in Treatment: [address], [place] ([id number]) [id number] Page 3 of 8 Encounter Discharge Information Items Post Procedure Vitals Discharge Condition: Stable Temperature (F): 97.6 Ambulatory Status: Ambulatory Pulse (bpm): 88 Discharge Destination: Home Respiratory Rate (breaths/min): 16 Transportation: Private Auto Blood Pressure (mmHg): 112/74 Accompanied By: self Schedule Follow-up Appointment: Yes Clinical Summary of Care: Electronic Signature(s) Signed: 09/08/2022 12:52:59 PM By: Massie Kluver Entered By: Massie Kluver on 09/07/2022 09:18:20 -------------------------------------------------------------------------------- Lower Extremity Assessment Details Patient Name: Date of Service: [name], [name] 09/07/2022 8:15 A M Medical Record Number: [id number] Patient Account Number: [id number] Date of Birth/Sex: Treating RN: Apr 22, 1956 (66 y.o. [name] Primary Care Zacary Bauer: [name] Other Clinician: Massie Kluver Referring Gaynelle Pastrana: Treating Lavonya Hoerner/Extender: Wynell Balloon Weeks in Treatment: 29 Electronic Signature(s) Signed:  09/07/2022 2:01:12 PM By: [name] Cool BSN, RN, CWS, Kim RN, BSN Signed: 09/08/2022 12:52:59 PM By: Massie Kluver Entered By: Massie Kluver on 09/07/2022 08:23:50 -------------------------------------------------------------------------------- Multi Wound Chart Details Patient Name: Date of Service: [name] 09/07/2022 8:15 A M Medical Record Number: [id number] Patient Account Number: [id number] Date of Birth/Sex: Treating RN: August 13, 1956 (66 y.o. [name] Primary Care Caree Wolpert: [name] Other Clinician: Massie Kluver Referring Tyke Outman: Treating Siarra Gilkerson/Extender: Wynell Balloon Weeks in Treatment: 54 Vital Signs Height(in): 74 Pulse(bpm): 88 Weight(lbs): 226 Blood Pressure(mmHg): 112/74 Body Mass Index(BMI): 29 Temperature(F): 97.6 Respiratory Rate(breaths/min): 16 [name], [name] ([id number]) [1:Photos:] [N/A:N/A] Right, Plantar Foot N/A N/A Wound Location: Blister N/A N/A Wounding Event: Diabetic Wound/Ulcer of the Lower N/A N/A Primary Etiology: Extremity Type II Diabetes, Osteoarthritis N/A N/A Comorbid History: 07/15/2020 N/A N/A Date Acquired: 77 N/A N/A Weeks of Treatment: Open N/A N/A Wound Status: No N/A N/A Wound Recurrence: 0.1x0.1x0.1 N/A N/A Measurements L x W x D (cm) 0.008 N/A N/A A (cm) : rea 0.001 N/A N/A Volume (cm) : 99.30% N/A N/A % Reduction in A rea: 99.90% N/A N/A % Reduction in Volume: Grade 2 N/A N/A Classification: Small N/A N/A Exudate A mount: Serosanguineous N/A N/A Exudate Type: red, brown N/A N/A Exudate Color: Thickened N/A N/A Wound Margin: Large (67-100%) N/A N/A Granulation A mount: Red N/A N/A Granulation Quality: None Present (0%) N/A N/A Necrotic A mount: Fat Layer (Subcutaneous Tissue): Yes N/A N/A Exposed Structures: Fascia: No Tendon: No Muscle: No Joint: No Bone: No None N/A N/A Epithelialization: Treatment Notes Electronic Signature(s) Signed: 09/08/2022 12:52:59 PM  By: Massie Kluver Entered By: Massie Kluver on 09/07/2022 08:24:02 -------------------------------------------------------------------------------- Multi-Disciplinary Care Plan Details Patient Name: Date of Service: [name] 09/07/2022 8:15 A M Medical Record Number: [id number] Patient Account Number: [id number] Date of Birth/Sex: Treating RN: 08/30/1956 (66 y.o. [name] Primary Care Goldie Tregoning: [name] Other Clinician: Massie Kluver Referring Arantza Darrington: Treating Yareliz Thorstenson/Extender: Wynell Balloon Weeks in Treatment: 36 Active Inactive Wound/Skin Impairment Nursing Diagnoses: Impaired tissue integrity Knowledge deficit related to smoking impact on wound healing [name], [name] ([id number]) [id number] Page 5 of 8 Knowledge deficit related to ulceration/compromised skin integrity Goals: Patient/caregiver will verbalize understanding of skin care regimen Date Initiated: 11/10/2021 Date Inactivated: 12/01/2021 Target  Resolution Date: 11/17/2021 Goal Status: Met Ulcer/skin breakdown will have a volume reduction of 30% by week 4 Date Initiated: 11/10/2021 Date Inactivated: 02/02/2022 Target Resolution Date: 12/08/2021 Goal Status: Met Ulcer/skin breakdown will have a volume reduction of 50% by week 8 Date Initiated: 11/10/2021 Target Resolution Date: 01/05/2022 Goal Status: Active Ulcer/skin breakdown will have a volume reduction of 80% by week 12 Date Initiated: 11/10/2021 Target Resolution Date: 02/02/2022 Goal Status: Active Ulcer/skin breakdown will heal within 14 weeks Date Initiated: 11/10/2021 Target Resolution Date: 02/16/2022 Goal Status: Active Interventions: Assess patient/caregiver ability to obtain necessary supplies Assess patient/caregiver ability to perform ulcer/skin care regimen upon admission and as needed Assess ulceration(s) every visit Notes: Electronic Signature(s) Signed: 09/07/2022 2:01:12 PM By: [name] Cool,  BSN, RN, CWS, Kim RN, BSN Signed: 09/08/2022 12:52:59 PM By: Massie Kluver Entered By: Massie Kluver on 09/07/2022 08:23:54 -------------------------------------------------------------------------------- Pain Assessment Details Patient Name: Date of Service: [name] 09/07/2022 8:15 A M Medical Record Number: [id number] Patient Account Number: [id number] Date of Birth/Sex: Treating RN: 10-05-1956 (66 y.o. [name] Primary Care Hitomi Slape: [name] Other Clinician: Massie Kluver Referring Mecca Barga: Treating Moody Robben/Extender: Wynell Balloon Weeks in Treatment: 86 Active Problems Location of Pain Severity and Description of Pain Patient Has Paino Yes Site Locations Duration of the Pain. Constant / Intermittento Constant Rate the pain. Current Pain Level: 5 Worst Pain Level: 7 Least Pain Level: 3 Tolerable Pain Level: 5 [name], [name] ([id number]) [id number] Page 6 of 8 Character of Pain Describe the Pain: Aching, Burning, Stabbing Pain Management and Medication Current Pain Management: Medication: Yes Cold Application: No Rest: Yes Massage: No Activity: No T.E.N.S.: No Heat Application: No Leg drop or elevation: No Is the Current Pain Management Adequate: Inadequate How does your wound impact your activities of daily livingo Sleep: No Bathing: No Appetite: No Relationship With Others: No Bladder Continence: No Emotions: No Bowel Continence: No Work: No Toileting: No Drive: No Dressing: No Hobbies: No Engineer, maintenance) Signed: 09/07/2022 2:01:12 PM By: [name] Cool, BSN, RN, CWS, Kim RN, BSN Signed: 09/08/2022 12:52:59 PM By: Massie Kluver Entered By: Massie Kluver on 09/07/2022 08:22:50 -------------------------------------------------------------------------------- Patient/Caregiver Education Details Patient Name: Date of Service: [name] 11/8/2023andnbsp8:15 A M Medical Record Number:  [id number] Patient Account Number: [id number] Date of Birth/Gender: Treating RN: 1956/10/04 (66 y.o. [name] Primary Care Physician: [name] Other Clinician: Massie Kluver Referring Physician: Treating Physician/Extender: Wynell Balloon Weeks in Treatment: 28 Education Assessment Education Provided To: Patient Education Topics Provided Wound/Skin Impairment: Handouts: Other: continue wound care as directed Methods: Explain/Verbal Responses: State content correctly Electronic Signature(s) Signed: 09/08/2022 12:52:59 PM By: Massie Kluver Entered By: Massie Kluver on 09/07/2022 09:02:13 [name] ([id number]) [id number] Page 7 of 8 -------------------------------------------------------------------------------- Wound Assessment Details Patient Name: Date of Service: [name], [name] 09/07/2022 8:15 A M Medical Record Number: [id number] Patient Account Number: [id number] Date of Birth/Sex: Treating RN: 04-20-1956 (66 y.o. [name], [name] Primary Care Zadia Uhde: [name] Other Clinician: Massie Kluver Referring Markee Remlinger: Treating Dallas Scorsone/Extender: Wynell Balloon Weeks in Treatment: 23 Wound Status Wound Number: 1 Primary Etiology: Diabetic Wound/Ulcer of the Lower Extremity Wound Location: Right, Plantar Foot Wound Status: Open Wounding Event: Blister Comorbid History: Type II Diabetes, Osteoarthritis Date Acquired: 07/15/2020 Weeks Of Treatment: 43 Clustered Wound: No Photos Wound Measurements Length: (cm) 0.1 Width: (cm) 0.1 Depth: (cm) 0.1 Area: (cm) 0.008 Volume: (cm) 0.001 % Reduction in Area: 99.3% % Reduction in Volume: 99.9% Epithelialization: None Tunneling: No Undermining: No Wound Description  Classification: Grade 2 Wound Margin: Thickened Exudate Amount: Small Exudate Type: Serosanguineous Exudate Color: red, brown Foul Odor After Cleansing: No Slough/Fibrino  No Wound Bed Granulation Amount: Large (67-100%) Exposed Structure Granulation Quality: Red Fascia Exposed: No Necrotic Amount: None Present (0%) Fat Layer (Subcutaneous Tissue) Exposed: Yes Tendon Exposed: No Muscle Exposed: No Joint Exposed: No Bone Exposed: No Treatment Notes Wound #1 (Foot) Wound Laterality: Plantar, Right Cleanser Wound Cleanser Discharge Instruction: Wash your hands with soap and water. Remove old dressing, discard into plastic bag and place into trash. Cleanse the wound with Wound Cleanser prior to applying a clean dressing using gauze sponges, not tissues or cotton balls. Do not scrub or use excessive [name], [name] ([id number]) [id number] Page 8 of 8 force. Pat dry using gauze sponges, not tissue or cotton balls. Peri-Wound Care Topical Primary Dressing PuraPly Secondary Dressing Zetuvit Plus 4x4 (in/in) Secured With Conform 4'' - Conforming Stretch Gauze Bandage 4x75 (in/in) Discharge Instruction: Apply as directed Compression Wrap Compression Stockings Add-Ons Electronic Signature(s) Signed: 09/07/2022 2:01:12 PM By: [name] Cool, BSN, RN, CWS, Kim RN, BSN Signed: 09/08/2022 12:52:59 PM By: Massie Kluver Entered By: Massie Kluver on 09/07/2022 08:23:40 -------------------------------------------------------------------------------- Vitals Details Patient Name: Date of Service: [name] 09/07/2022 8:15 A M Medical Record Number: [id number] Patient Account Number: [id number] Date of Birth/Sex: Treating RN: 1956/05/30 (66 y.o. [name] Primary Care Khamiyah Grefe: [name] Other Clinician: Massie Kluver Referring Eulogio Requena: Treating Ambrielle Kington/Extender: Wynell Balloon Weeks in Treatment: 28 Vital Signs Time Taken: 08:10 Temperature (F): 97.6 Height (in): 74 Pulse (bpm): 88 Weight (lbs): 226 Respiratory Rate (breaths/min): 16 Body Mass Index (BMI): 29 Blood Pressure (mmHg): 112/74 Reference  Range: 80 - 120 mg / dl Electronic Signature(s) Signed: 09/08/2022 12:52:59 PM By: Massie Kluver Entered By: Massie Kluver on 09/07/2022 08:22:46

## 2022-09-14 ENCOUNTER — Encounter (HOSPITAL_BASED_OUTPATIENT_CLINIC_OR_DEPARTMENT_OTHER): Payer: Medicare Other | Admitting: Internal Medicine

## 2022-09-14 DIAGNOSIS — L97512 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ed: Secondary | ICD-10-CM

## 2022-09-14 DIAGNOSIS — S98022S 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, sequela: Secondary | ICD-10-CM | POA: Diagnosis not present

## 2022-09-14 DIAGNOSIS — E11621 Type 2 diabetes mellitus with foot ulcer: Secondary | ICD-10-CM | POA: Diagnosis not present

## 2022-09-14 DIAGNOSIS — E1140 Type 2 diabetes mellitus with diabetic neuropathy, unspecified: Secondary | ICD-10-CM

## 2022-09-15 ENCOUNTER — Ambulatory Visit (INDEPENDENT_AMBULATORY_CARE_PROVIDER_SITE_OTHER): Payer: Medicare Other | Admitting: Podiatry

## 2022-09-15 DIAGNOSIS — E119 Type 2 diabetes mellitus without complications: Secondary | ICD-10-CM

## 2022-09-15 DIAGNOSIS — Z89432 Acquired absence of left foot: Secondary | ICD-10-CM

## 2022-09-16 NOTE — Progress Notes (Signed)
[name], [name] ([id number]) [id number] Page 1 of 9 Visit Report for 09/14/2022 Chief Complaint Document Details Patient Name: Date of Service: [name], [name] [name] 09/14/2022 8:15 A M Medical Record Number: [id number] Patient Account Number: [id number] Date of Birth/Sex: Treating RN: October 30, 1956 (65 y.o. [name]) [name] Primary Care Provider: Manson [name] Other Clinician: Betha [name] Referring Provider: Treating Provider/Extender: [name] Weeks in Treatment: 28 Information Obtained from: Patient Chief Complaint Right plantar foot wound Electronic Signature(s) Signed: 09/14/2022 2:25:25 PM By: [name] Corwin DO Entered By: [name] on 09/14/2022 08:36:45 -------------------------------------------------------------------------------- HPI Details Patient Name: Date of Service: [name] 09/14/2022 8:15 A M Medical Record Number: [id number] Patient Account Number: [id number] Date of Birth/Sex: Treating RN: 24-Feb-1956 (65 y.o. [name] Primary Care Provider: Manson [name] Other Clinician: Betha [name] Referring Provider: Treating Provider/Extender: [name] Weeks in Treatment: 48 History of Present Illness HPI Description: Admission 11/10/2021 [name] is a 66 year old male with a past medical history of uncontrolled type 2 diabetes with last hemoglobin A1c of 8.1 complicated by peripheral neuropathy that presents to the clinic for a 2-year history of nonhealing ulcer to the bottom of his right foot. He states this started out as a blister caused by a work boot. He reports receiving wound care when he resided in [place]. He is reestablishing his wound care in [facility] today. He is currently keeping the area clean and covered. He has insoles designed to help offload the wound bed. He currently denies signs of infection. 1/18; patient presents for follow-up. He  has been using Hydrofera Blue to the wound bed. He has no issues or complaints today. He has not received the defender.[name] He denies signs of infection. 2/1; patient presents for follow-up. He has been using Hydrofera Blue to the wound bed he states he received the defender boot and has been using it however he did not have it on today. He currently denies signs of infection to the right foot. Unfortunately he developed a wound to the left great toe over the past week. He states he received new orthotics and these caused a blister to the left great toe which turned into a wound. He has not been doing anything to the wound bed. He currently denies systemic signs of infection. 2/8; Patient presents for follow-up. 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 He states he is about 90% recovered. He has some mild weakness to the left side. He reports taking the antibiotics prescribed at last clinic visit. He reports using Hydrofera Blue for dressing changes. He only uses the offloading boot when he is at home. He uses open [name], [name] ([id number]) [id number] Page 2 of 9 shoes to the right foot. He currently denies signs of infection. 2/15; patient presents for follow-up. He states he is still taking the antibiotics prescribed. He reports using the defender boot to the right foot and open toed shoe to the left foot however he is not wearing either today. He currently denies systemic signs of infection. He stated he cut down a tree last week and was outside doing yard work. 2/22; patient presents for follow-up. 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 He reports feeling well. He reports using the defender boot to the right leg and continues to use Hydrofera Blue with dressing changes. He denies systemic  signs of infection. 3/8;  patient presents for follow-up. He sees Dr. Alberteen [name] tomorrow for follow-up of the left great toe amputation. This was a partial amputation. He reports he is taking Augmentin currently. He reports increased erythema to the left great toe amputation site. He also reports a decline in his right plantar foot wound. He reports it is draining more. He denies purulent drainage. 3/15; patient presents for follow-up. He did not see Dr. Alberteen [name] last week but states he sees him today. He states he has been taking doxycycline in the past week and started gentamicin ointment. He continues to use [facility] with dressing changes. He currently denies systemic signs of infection. 3/22; patient presents for follow-up. Patient had partial amputation of the left great toe on 2/15. 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 Alberteen [name]. He reports no issues and has no complaints. He is currently taking Keflex and doxycycline prescribed by Dr. Alberteen [name]. He continues to use gentamicin ointment and Hydrofera Blue to the right foot wound. He reports using the defender boot when he is at home. 3/29; patient presents for follow-up. He continues to be on antibiotics per podiatry. He has been using Hydrofera Blue and gentamicin ointment to the right plantar foot wound. He has no issues or complaints today. He denies signs of infection. 4/5; patient presents for follow-up. He has been using Dakin's wet-to-dry dressings with improvement to wound healing. He denies signs of infection. She has no issues or complaints today. 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 Use Hydrofera Blue for a prolonged period of time and has been using Dakin's wet-to-dry I think for the last 2 to 3 weeks. He has a Psychologist, forensic at home but he does not wear that coming into the clinic because he drives. He tells me he has been compliant with the defender boot short of when he goes out to drive. He lives alone. He  also tells me he has had a previous left great toe amputation We are following him for the right foot wound. 4/26; patient presents for follow-up. He had worsening of his left foot wound and had surgical debridement on 4/16 by Dr. Fanny Skates. He is being followed by podiatry for this issue. He he is on IV Unasyn based on tissue culture by ID.[name] We are following him for the right foot wound. He has been using Dakin's wet-to- dry dressings without issues. He reports continued usage of the defender boot. 5/3; patient presents for follow-up. He has been using Dakin's wet-to-dry dressings to the right foot wound. He has no issues or complaints today. He reports using the Psychologist, forensic. He reports offloading the foot wound when he is at home by sitting in a recliner. He denies signs of infection. He is still getting IV antibiotics For the left foot wound that is being followed by podiatry. He states the end date is later this month on 5/28. 5/17; patient presents for follow-up. He missed his last clinic appointment. He has been using Dakin's wet-to-dry dressings. He states he is offloading the right foot wound with the defender boot although he does not have this today. He is still on IV antibiotics. He has no issues or complaints today. 5/24; patient presents for follow-up. He has been using Hydrofera Blue to the wound bed without issues. He denies signs of infection. At last clinic visit he had a wound culture done that detected high levels of Enterococcus faecalis and low levels  of Corynebacterium stratum and Staphylococcus epidermidis. [facility] antibiotics were ordered. He has not heard from the company yet. 5/31; patient presents for follow-up. He continues to use Hydrofera Blue to the wound bed. He denies signs of infection. [facility] antibiotics were ordered at last clinic visit and he states he called the company to pay for it. He states that it is being shipped. We rediscussed the total contact cast  and he declines having this placed today. He states he is wearing the defender boot however he never wears it into the office. 04-06-2022 upon evaluation today patient appears to be doing about the same in regard to his wound. This is not significantly smaller. He is not wearing his offloading boot. We discussed that today he tells me that he wears it "all the time as he chuckled and does not have it on during the office visit today. I think it is increasingly obvious that he is just messing around when he says this based on what I am seeing and otherwise I do not know why he would wear it all the time but not to the clinic. Either way my opinion based on what I am seeing currently is simply that the wound does seem to be still having quite a bit of callus buildup around the edges it is also very dry. He does tell me that he has been using the [facility] since he got it and to be honest I think this is good and should hopefully help keep things under control but at the same time I do not see any evidence of active infection. Again the Jodie Echevaria is a topical antibiotic with multiple compounds to help prevent further infection and worsening overall. 6/14; patient presents for follow-up. He has been using [facility] antibiotics with [employer]	[place] daily. He states he is wearing his offloading boot. He does not have this in office today. He is going to the beach for the next 5 days. He states he Will not be using using any offloading device During this time. I again offered the total contact cast however he declined. I did ask him to reconsider for next clinic visit. He currently denies signs of infection. 6/21; patient presents for follow-up. He went to the beach last week. He did not use an offloading device. Wound is slightly deeper today. I again offered the total contact cast but he declined. Currently denies signs of infection. He reports using the [place], hydrofera blue and the Psychologist, forensic. 6/28;  patient presents for follow-up. Patient declines total contact cast today. He has been using [facility] and [facility] to the wound bed. It is unclear if he is using the Psychologist, forensic. He would like a letter for the post man to delivers mail to his front door so he does not have to walk down the driveway. 7/5; patient presents for follow-up. He states he is considering the total contact cast however does not want this placed today. He is actually going to the gym after this appointment. He does not want offloading foot wear for this. He has been using [facility] and [facility] to the wound bed. He denies signs of infection. 7/12; patient presents for follow-up. He is still considering the total contact cast however does not want this today. He has been using [facility] antibiotic and Hydrofera Blue to the wound bed. He denies signs of infection. He has no issues or complaints today. 7/26; patient presents for follow-up. He declines a total contact cast. He has been using  his an antibiotic and Hydrofera Blue to the wound bed. He denies systemic signs of infection. He is scheduled to discuss orthotics next week with his podiatrist. 8/2; patient presents for follow-up. He has been using Hydrofera Blue to the wound bed. He has no issues or complaints today. He declines a total contact cast. He states he is getting orthotics today. 8/9; patient presents for follow-up. He has been using Hydrofera Blue to the wound bed. He again declines the total contact cast. He does state that he is considering starting it at the end of the month. He has a vacation coming up and does not want to have it in place for that. 8/16; patient presents for follow-up. He has been using Hydrofera Blue and [facility] to the wound bed. He started taking Augmentin and doxycycline as prescribed. He denies signs of infection. He is going on his vacation next week and will be back in 2 weeks. He states he is considering the total contact  cast for when he comes back from his trip. 8/30; patient present for follow-up. He has been using Hydrofera Blue and [facility] to the wound bed. He continues to take doxycycline and Augmentin without issues. He went on vacation and reports walking for long periods of time without pressure relief to the wound bed. He currently denies systemic signs of infection. [name], [name] ([id number]) [id number] Page 3 of 9 9/6; patient presents for follow-up. He has been using Hydrofera Blue and [facility] to the wound bed. He is not wearing his offloading boot. He declines a total contact cast. He has finished Augmentin and doxycycline. He had an MRI completed on 07/01/2022 that did not show an underlying abscess, septic arthritis or osteomyelitis. He currently denies systemic signs of infection. 9/13; patient presents for follow-up. Has been using Hydrofera Blue and [facility] to the wound bed. He does not present with his offloading boot. He declines the total contact cast. He has been approved for PuraPly. We discussed this today and he would like this placed at next clinic visit. He denies signs of infection. 9/20; patient presents for follow-up. We have been using Hydrofera Blue and [facility] antibiotic ointment. Patient denies signs of infection. PuraPly is available for placement and patient would like to proceed with this. 9/27; patient presents for follow-up. PuraPly #1 was placed in standard fashion to the right foot at last clinic visit. He tolerated this well. Unfortunately has developed a wound to the plantar surface of the left foot. He states he noticed this Sunday. He has pain to the site. He denies purulent drainage increased warmth or erythema. 10/4; patient presents for follow-up. PuraPly #2 was placed in standard fashion to the right foot at last clinic visit. He started taking Augmentin and doxycycline prescribed at last clinic visit due to findings of infection to the  left foot. He has been doing Dakin's wet-to-dry to this area. He reports stability in his symptoms. He denies systemic signs of infection. 10/11; patient presents for follow-up. PuraPly #3 was placed in standard fashion to the right foot at last clinic visit. Unfortunately the clinic did not order another PuraPly for this week. He continues to take Augmentin and doxycycline. He still has pain in the left foot. He had an x-ray that did not show radiographic evidence of osteomyelitis. He still has serous drainage on exam. He is doing Dakin's wet-to-dry dressings to this area. 10/18; patient presents for follow-up. He has been using Hydrofera Blue and [facility] to the right foot. PuraPly is  available for placement today. He has been taking levofloxacin for the past week. He continues to have pain in the left foot with increased redness and swelling. An MRI was ordered at last clinic visit this has not been done yet. He has been doing Dakin's wet-to-dry dressings to the left foot. 10/25; since the patient was last here he was admitted to hospital from 1018 through 10/20. He underwent a transmetatarsal amputation by Dr. Allena KatzPatel of podiatry. He received vancomycin and Rocephin in the hospital and was discharged on 10 days of doxycycline. He has a follow-up with Dr. Allena KatzPatel this week on Friday he is not changing the dressing. The wound we are following is on the plantar right foot between the first and second metatarsals. We applied puraply #5 today 11/1; patient came into the clinic today with a note from Dr. Allena KatzPatel at [facility] asking us to look at the left foot and consider removing the sutures. Otherwise the area on his right foot that we have been applying Puraply to looks really very healthy we applied Puraply #6 11/8; patient presents for follow-up. Patient Has not had follow-up with Dr. Allena KatzPatel. We have been applying PuraPly to the right foot. He continues to show improvement in wound  healing. 11/15; patient presents for follow-up. PuraPly #7 was placed in standard fashion last clinic visit. His wound is healed. He has an appointment with Dr. Allena KatzPatel tomorrow to follow-up on the left transmetatarsal amputation. Electronic Signature(s) Signed: 09/14/2022 2:25:25 PM By: [name] CorwinHoffman, Chasmine Lender DO Entered By: [name] CorwinHoffman, Patches Mcdonnell on 09/14/2022 08:38:04 -------------------------------------------------------------------------------- Physical Exam Details Patient Name: Date of Service: [name] MealyNEELY, [name] 09/14/2022 8:15 A M Medical Record Number: [id number] Patient Account Number: [id number] Date of Birth/Sex: Treating RN: 15-Nov-1955 (65 y.o. [name] PetitM) [name] PaxEpps, Carrie Primary Care Provider: Manson AllanFields, Glenda Other Clinician: Betha LoaVenable, Angie Referring Provider: Treating Provider/Extender: [name] ScotlandHoffman, Renae Mottley Fields, Glenda Weeks in Treatment: 9844 Constitutional . Cardiovascular . Psychiatric . Notes T the right plantar foot there is epithelization to the previous wound site. Wilfred Lacyo Fetterly, [name] ([id number]) [id number] Page 4 of 9 Electronic Signature(s) Signed: 09/14/2022 2:25:25 PM By: [name] CorwinHoffman, Detravion Tester DO Entered By: [name] CorwinHoffman, Margareta Laureano on 09/14/2022 08:38:47 -------------------------------------------------------------------------------- Physician Orders Details Patient Name: Date of Service: [name] MealyNEELY, [name] 09/14/2022 8:15 A M Medical Record Number: [id number] Patient Account Number: [id number] Date of Birth/Sex: Treating RN: 15-Nov-1955 (65 y.o. [name] FloridaM) Epps, Carrie Primary Care Provider: Manson AllanFields, Glenda Other Clinician: Betha LoaVenable, Angie Referring Provider: Treating Provider/Extender: [name] ScotlandHoffman, Givanni Staron Fields, Glenda Weeks in Treatment: 6144 Verbal / Phone Orders: No [facility] Complete - Your wound has healed. Please call if any further issues arise Off-Loading Other: - use callus pad to  area Electronic Signature(s) Signed: 09/14/2022 2:25:25 PM By: [name] CorwinHoffman, Kamya Watling DO Entered By: [name] CorwinHoffman, Eligio Angert on 09/14/2022 08:41:51 -------------------------------------------------------------------------------- Problem List Details Patient Name: Date of Service: [name] MealyNEELY, [name] 09/14/2022 8:15 A M Medical Record Number: [id number] Patient Account Number: [id number] Date of Birth/Sex: Treating RN: 15-Nov-1955 (65 y.o. [name] FloridaM) Epps, Carrie Primary Care Provider: Manson AllanFields, Glenda Other Clinician: Betha LoaVenable, Angie Referring Provider: Treating Provider/Extender: [name] ScotlandHoffman, Kris Burd Fields, Glenda Weeks in Treatment: 4844 Active Problems ICD-10 Encounter Code Description Active Date MDM Diagnosis L97.512 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ed 11/10/2021 No Yes E11.621 Type 2 diabetes mellitus with foot ulcer 11/10/2021 No Yes [name], [name] ([id number]) [id number] Page 5 of 9 E11.40 Type 2 diabetes mellitus with diabetic neuropathy, unspecified 11/10/2021 No Yes R26.89 Other abnormalities of  gait and mobility 11/10/2021 No Yes S98.022S 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, sequela 08/31/2022 No Yes Inactive Problems ICD-10 Code Description Active Date Inactive Date L97.528 Non-pressure chronic ulcer of other part of left foot with other specified severity 12/01/2021 12/01/2021 S91.302A Unspecified open wound, left foot, initial encounter 07/27/2022 07/27/2022 W09.811 Pain in left foot 08/10/2022 08/10/2022 Resolved Problems Electronic Signature(s) Signed: 09/14/2022 2:25:25 PM By: [name] Corwin DO Entered By: [name] on 09/14/2022 08:35:45 -------------------------------------------------------------------------------- Progress Note Details Patient Name: Date of Service: [name] 09/14/2022 8:15 A M Medical Record Number: [id number] Patient Account Number: [id number] Date of Birth/Sex: Treating  RN: 02-16-1956 (65 y.o. [name]) [name] Primary Care Provider: Manson [name] Other Clinician: Betha [name] Referring Provider: Treating Provider/Extender: [name] Weeks in Treatment: 64 Subjective Chief Complaint Information obtained from Patient Right plantar foot wound History of Present Illness (HPI) Admission 11/10/2021 Mr. Kholton Coate is a 66 year old male with a past medical history of uncontrolled type 2 diabetes with last hemoglobin A1c of 8.1 complicated by peripheral neuropathy that presents to the clinic for a 2-year history of nonhealing ulcer to the bottom of his right foot. He states this started out as a blister caused by a work boot. He reports receiving wound care when he resided in [place]. He is reestablishing his wound care in [facility] today. He is currently keeping the area clean and covered. He has insoles designed to help offload the wound bed. He currently denies signs of infection. 1/18; patient presents for follow-up. He has been using Hydrofera Blue to the wound bed. He has no issues or complaints today. He has not received the defender.[name] He denies signs of infection. 2/1; patient presents for follow-up. He has been using Hydrofera Blue to the wound bed he states he received the defender boot and has been using it however he did not have it on today. He currently denies signs of infection to the right foot. Unfortunately he developed a wound to the left great toe over the past [name], [name] ([id number]) [id number] Page 6 of 9 week. He states he received new orthotics and these caused a blister to the left great toe which turned into a wound. He has not been doing anything to the wound bed. He currently denies systemic signs of infection. 2/8; Patient presents for follow-up. Since last seen in the clinic he experienced a CVA and was hospitalized for 2 days. He had an acute small vessel infarct  of the right thalamic capsular region. He states he is about 90% recovered. He has some mild weakness to the left side. He reports taking the antibiotics prescribed at last clinic visit. He reports using Hydrofera Blue for dressing changes. He only uses the offloading boot when he is at home. He uses open toed shoes to the right foot. He currently denies signs of infection. 2/15; patient presents for follow-up. He states he is still taking the antibiotics prescribed. He reports using the defender boot to the right foot and open toed shoe to the left foot however he is not wearing either today. He currently denies systemic signs of infection. He stated he cut down a tree last week and was outside doing yard work. 2/22; patient presents for follow-up. 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 He reports feeling well. He reports using the defender boot to the right leg and continues to use Hydrofera Blue with dressing changes. He denies systemic signs of  infection. 3/8; patient presents for follow-up. He sees Dr. Alberteen [name] tomorrow for follow-up of the left great toe amputation. This was a partial amputation. He reports he is taking Augmentin currently. He reports increased erythema to the left great toe amputation site. He also reports a decline in his right plantar foot wound. He reports it is draining more. He denies purulent drainage. 3/15; patient presents for follow-up. He did not see Dr. Alberteen [name] last week but states he sees him today. He states he has been taking doxycycline in the past week and started gentamicin ointment. He continues to use [facility] with dressing changes. He currently denies systemic signs of infection. 3/22; patient presents for follow-up. Patient had partial amputation of the left great toe on 2/15. 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 Alberteen [name]. He reports no issues and has no complaints. He is currently taking  Keflex and doxycycline prescribed by Dr. Alberteen [name]. He continues to use gentamicin ointment and Hydrofera Blue to the right foot wound. He reports using the defender boot when he is at home. 3/29; patient presents for follow-up. He continues to be on antibiotics per podiatry. He has been using Hydrofera Blue and gentamicin ointment to the right plantar foot wound. He has no issues or complaints today. He denies signs of infection. 4/5; patient presents for follow-up. He has been using Dakin's wet-to-dry dressings with improvement to wound healing. He denies signs of infection. She has no issues or complaints today. 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 Use Hydrofera Blue for a prolonged period of time and has been using Dakin's wet-to-dry I think for the last 2 to 3 weeks. He has a Psychologist, forensic at home but he does not wear that coming into the clinic because he drives. He tells me he has been compliant with the defender boot short of when he goes out to drive. He lives alone. He also tells me he has had a previous left great toe amputation We are following him for the right foot wound. 4/26; patient presents for follow-up. He had worsening of his left foot wound and had surgical debridement on 4/16 by Dr. Fanny Skates. He is being followed by podiatry for this issue. He he is on IV Unasyn based on tissue culture by ID.[name] We are following him for the right foot wound. He has been using Dakin's wet-to- dry dressings without issues. He reports continued usage of the defender boot. 5/3; patient presents for follow-up. He has been using Dakin's wet-to-dry dressings to the right foot wound. He has no issues or complaints today. He reports using the Psychologist, forensic. He reports offloading the foot wound when he is at home by sitting in a recliner. He denies signs of infection. He is still getting IV antibiotics For the left foot wound that is being followed by podiatry. He states the end  date is later this month on 5/28. 5/17; patient presents for follow-up. He missed his last clinic appointment. He has been using Dakin's wet-to-dry dressings. He states he is offloading the right foot wound with the defender boot although he does not have this today. He is still on IV antibiotics. He has no issues or complaints today. 5/24; patient presents for follow-up. He has been using Hydrofera Blue to the wound bed without issues. He denies signs of infection. At last clinic visit he had a wound culture done that detected high levels of Enterococcus faecalis and low levels of Corynebacterium  stratum and Staphylococcus epidermidis. [facility] antibiotics were ordered. He has not heard from the company yet. 5/31; patient presents for follow-up. He continues to use Hydrofera Blue to the wound bed. He denies signs of infection. [facility] antibiotics were ordered at last clinic visit and he states he called the company to pay for it. He states that it is being shipped. We rediscussed the total contact cast and he declines having this placed today. He states he is wearing the defender boot however he never wears it into the office. 04-06-2022 upon evaluation today patient appears to be doing about the same in regard to his wound. This is not significantly smaller. He is not wearing his offloading boot. We discussed that today he tells me that he wears it "all the time as he chuckled and does not have it on during the office visit today. I think it is increasingly obvious that he is just messing around when he says this based on what I am seeing and otherwise I do not know why he would wear it all the time but not to the clinic. Either way my opinion based on what I am seeing currently is simply that the wound does seem to be still having quite a bit of callus buildup around the edges it is also very dry. He does tell me that he has been using the [facility] since he got it and to be honest I think this is good  and should hopefully help keep things under control but at the same time I do not see any evidence of active infection. Again the Jodie Echevaria is a topical antibiotic with multiple compounds to help prevent further infection and worsening overall. 6/14; patient presents for follow-up. He has been using Key[facility] antibiotics with [employer]	[place] daily. He states he is wearing his offloading boot. He does not have this in office today. He is going to the beach for the next 5 days. He states he Will not be using using any offloading device During this time. I again offered the total contact cast however he declined. I did ask him to reconsider for next clinic visit. He currently denies signs of infection. 6/21; patient presents for follow-up. He went to the beach last week. He did not use an offloading device. Wound is slightly deeper today. I again offered the total contact cast but he declined. Currently denies signs of infection. He reports using the [place], hydrofera blue and the Psychologist, forensic. 6/28; patient presents for follow-up. Patient declines total contact cast today. He has been using Keystone and [facility] to the wound bed. It is unclear if he is using the Psychologist, forensic. He would like a letter for the post man to delivers mail to his front door so he does not have to walk down the driveway. 7/5; patient presents for follow-up. He states he is considering the total contact cast however does not want this placed today. He is actually going to the gym after this appointment. He does not want offloading foot wear for this. He has been using [facility] and [facility] to the wound bed. He denies signs of infection. 7/12; patient presents for follow-up. He is still considering the total contact cast however does not want this today. He has been using [facility]stone antibiotic and Hydrofera Blue to the wound bed. He denies signs of infection. He has no issues or complaints today. 7/26; patient presents  for follow-up. He declines a total contact cast. He has been using his an  antibiotic and Hydrofera Blue to the wound bed. He denies systemic signs of infection. He is scheduled to discuss orthotics next week with his podiatrist. 8/2; patient presents for follow-up. He has been using Hydrofera Blue to the wound bed. He has no issues or complaints today. He declines a total contact cast. He states he is getting orthotics today. 8/9; patient presents for follow-up. He has been using Hydrofera Blue to the wound bed. He again declines the total contact cast. He does state that he is considering starting it at the end of the month. He has a vacation coming up and does not want to have it in place for that. 8/16; patient presents for follow-up. He has been using Hydrofera Blue and [facility] to the wound bed. He started taking Augmentin and doxycycline as [name], [name] ([id number]) [id number] Page 7 of 9 prescribed. He denies signs of infection. He is going on his vacation next week and will be back in 2 weeks. He states he is considering the total contact cast for when he comes back from his trip. 8/30; patient present for follow-up. He has been using Hydrofera Blue and [facility] to the wound bed. He continues to take doxycycline and Augmentin without issues. He went on vacation and reports walking for long periods of time without pressure relief to the wound bed. He currently denies systemic signs of infection. 9/6; patient presents for follow-up. He has been using Hydrofera Blue and [facility] to the wound bed. He is not wearing his offloading boot. He declines a total contact cast. He has finished Augmentin and doxycycline. He had an MRI completed on 07/01/2022 that did not show an underlying abscess, septic arthritis or osteomyelitis. He currently denies systemic signs of infection. 9/13; patient presents for follow-up. Has been using Hydrofera Blue and [facility] to the wound bed.  He does not present with his offloading boot. He declines the total contact cast. He has been approved for PuraPly. We discussed this today and he would like this placed at next clinic visit. He denies signs of infection. 9/20; patient presents for follow-up. We have been using Hydrofera Blue and [facility] antibiotic ointment. Patient denies signs of infection. PuraPly is available for placement and patient would like to proceed with this. 9/27; patient presents for follow-up. PuraPly #1 was placed in standard fashion to the right foot at last clinic visit. He tolerated this well. Unfortunately has developed a wound to the plantar surface of the left foot. He states he noticed this Sunday. He has pain to the site. He denies purulent drainage increased warmth or erythema. 10/4; patient presents for follow-up. PuraPly #2 was placed in standard fashion to the right foot at last clinic visit. He started taking Augmentin and doxycycline prescribed at last clinic visit due to findings of infection to the left foot. He has been doing Dakin's wet-to-dry to this area. He reports stability in his symptoms. He denies systemic signs of infection. 10/11; patient presents for follow-up. PuraPly #3 was placed in standard fashion to the right foot at last clinic visit. Unfortunately the clinic did not order another PuraPly for this week. He continues to take Augmentin and doxycycline. He still has pain in the left foot. He had an x-ray that did not show radiographic evidence of osteomyelitis. He still has serous drainage on exam. He is doing Dakin's wet-to-dry dressings to this area. 10/18; patient presents for follow-up. He has been using Hydrofera Blue and [facility] to the right foot. PuraPly is available for placement  today. He has been taking levofloxacin for the past week. He continues to have pain in the left foot with increased redness and swelling. An MRI was ordered at last clinic visit this has not been done  yet. He has been doing Dakin's wet-to-dry dressings to the left foot. 10/25; since the patient was last here he was admitted to hospital from 1018 through 10/20. He underwent a transmetatarsal amputation by Dr. Allena Katz of podiatry. He received vancomycin and Rocephin in the hospital and was discharged on 10 days of doxycycline. He has a follow-up with Dr. Allena Katz this week on Friday he is not changing the dressing. The wound we are following is on the plantar right foot between the first and second metatarsals. We applied puraply #5 today 11/1; patient came into the clinic today with a note from Dr. Allena Katz at [facility] asking [place] to look at the left foot and consider removing the sutures. Otherwise the area on his right foot that we have been applying Puraply to looks really very healthy we applied Puraply #6 11/8; patient presents for follow-up. Patient Has not had follow-up with Dr. Allena Katz. We have been applying PuraPly to the right foot. He continues to show improvement in wound healing. 11/15; patient presents for follow-up. PuraPly #7 was placed in standard fashion last clinic visit. His wound is healed. He has an appointment with Dr. Allena Katz tomorrow to follow-up on the left transmetatarsal amputation. Objective Constitutional Vitals Time Taken: 8:13 AM, Height: 74 in, Weight: 226 lbs, BMI: 29, Temperature: 97.9 F, Pulse: 84 bpm, Respiratory Rate: 16 breaths/min, Blood Pressure: 126/78 mmHg. General Notes: T the right plantar foot there is epithelization to the previous wound site. o Integumentary (Hair, Skin) Wound #1 status is Healed - Epithelialized. Original cause of wound was Blister. The date acquired was: 07/15/2020. The wound has been in treatment 44 weeks. The wound is located on the Right,Plantar Foot. The wound measures 0cm length x 0cm width x 0cm depth; 0cm^2 area and 0cm^3 volume. There is Fat Layer (Subcutaneous Tissue) exposed. There is no tunneling or undermining noted.  There is a none present amount of drainage noted. The wound margin is thickened. There is no granulation within the wound bed. There is no necrotic tissue within the wound bed. 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 His wound is healed. I recommended continuing to offload this area for the next 1 to 2 weeks. I recommended he inspect his feet daily to assess for any developing wounds. He has follow-up with Dr. Allena Katz, [facility] tomorrow to follow-up on his transmetatarsal amputation. I recommended he follow-up as needed in our clinic. Plan Discharge From [facility] Services: Discharge from Wound Care Center Treatment Complete - Your wound has healed. Please call if any further issues arise Off-Loading: Other: - use callus pad to area 1. Discharge from clinic due to closed wound 2. Follow-up as needed 3. Inspect feet daily Electronic Signature(s) Signed: 09/14/2022 2:25:25 PM By: [name] Corwin DO Entered By: [name] on 09/14/2022 08:41:24 -------------------------------------------------------------------------------- ROS/PFSH Details Patient Name: Date of Service: [name] 09/14/2022 8:15 A M Medical Record Number: [id number] Patient Account Number: [id number] Date  of Birth/Sex: Treating RN: 1956/03/07 (65 y.o. [name]) [name] Primary Care Provider: Manson [name] Other Clinician: Betha [name] Referring Provider: Treating Provider/Extender: [name] Weeks in Treatment: 11 Information Obtained From Patient Endocrine Medical  History: Positive for: Type II Diabetes Time with diabetes: 40 years Treated with: Oral agents, Diet Blood sugar tested every day: No Musculoskeletal Medical History: Positive for: Osteoarthritis Immunizations Pneumococcal Vaccine: Received Pneumococcal Vaccination: Yes Received Pneumococcal Vaccination On or After 60th Birthday: Yes Implantable Devices None Family and Social History FIELDING, MAULT ([id number]) [id number] Page 9 of 9 Former smoker - ended on 10/07/1997; Marital Status - Divorced; Alcohol Use: Never - stopped 4 yrs ago; Drug Use: No History; Caffeine Use: Daily Electronic Signature(s) Signed: 09/14/2022 2:25:25 PM By: [name] Corwin DO Signed: 09/16/2022 1:14:49 PM By: [name] Pax RN Entered By: [name] on 09/14/2022 08:42:02 -------------------------------------------------------------------------------- SuperBill Details Patient Name: Date of Service: [name] 09/14/2022 Medical Record Number: [id number] Patient Account Number: [id number] Date of Birth/Sex: Treating RN: 04-15-1956 (65 y.o. 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, sequela Facility Procedures : CPT4 Code: [id number] Description: 84696 - WOUND CARE VISIT-LEV 2 EST PT Modifier: Quantity: 1 Physician Procedures : CPT4 Code Description Modifier [id number] - WC PHYS LEVEL 3 - EST PT ICD-10 Diagnosis Description L97.512 Non-pressure chronic  ulcer of other part of right foot with fat layer exposed E11.621 Type 2 diabetes mellitus with foot ulcer E11.40 Type 2  diabetes mellitus with diabetic neuropathy, unspecified S98.022S 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

## 2022-09-16 NOTE — Progress Notes (Signed)
EMAAD, [name] ([id number]) [id number] Page 1 of 8 Visit Report for 09/14/2022 Arrival Information Details Patient Name: Date of Service: [name] [name] [name] 09/14/2022 8:15 A M Medical Record Number: [id number] Patient Account Number: [id number] Date of Birth/Sex: Treating RN: 06-22-1956 (66 y.o. [name] [name], [name] Batten Primary Care [name] [name]: [name] [name] Other Clinician: Betha [name] Referring [name] [name]: Treating [name] [name]/Extender: [name] [name] Weeks in Treatment: 50 Visit Information History Since Last Visit All ordered tests and consults were completed: No Patient Arrived: Ambulatory Added or deleted any medications: No Arrival Time: 08:02 Any new allergies or adverse reactions: No Transfer Assistance: None Had a fall or experienced change in No Patient Requires Transmission-Based Precautions: No activities of daily living that may affect Patient Has Alerts: Yes risk of falls: Patient Alerts: Patient on Blood Thinner Signs or symptoms of abuse/neglect since last visito No DIABETIC Hospitalized since last visit: No Plavix Implantable device outside of the clinic excluding No cellular tissue based products placed in the center since last visit: Pain Present Now: No Electronic Signature(s) Signed: 09/15/2022 5:06:55 PM By: [name] [name] Entered By: [name] [name] on 09/14/2022 08:12:37 -------------------------------------------------------------------------------- Clinic Level of Care Assessment Details Patient Name: Date of Service: [name] [name] [name] [name] 09/14/2022 8:15 A M Medical Record Number: [id number] Patient Account Number: [id number] Date of Birth/Sex: Treating RN: 1956/03/08 (65 y.o. [name] [name]) [name] [name] Primary Care [name] [name]: [name] [name] Other Clinician: Betha [name] Referring [name] [name]: Treating [name] [name]/Extender: [name] [name] Weeks in Treatment: 58 Clinic Level of Care Assessment Items TOOL 4 Quantity  Score []  - 0 Use when only an EandM is performed on FOLLOW-UP visit ASSESSMENTS - Nursing Assessment / Reassessment X- 1 10 Reassessment of Co-morbidities (includes updates in patient status) X- 1 5 Reassessment of Adherence to Treatment Plan Farabee, Brien ( ) [id number] Page 2 of 8 ASSESSMENTS - Wound and Skin A ssessment / Reassessment X - Simple Wound Assessment / Reassessment - one wound 1 5 []  - 0 Complex Wound Assessment / Reassessment - multiple wounds []  - 0 Dermatologic / Skin Assessment (not related to wound area) ASSESSMENTS - Focused Assessment []  - 0 Circumferential Edema Measurements - multi extremities []  - 0 Nutritional Assessment / Counseling / Intervention []  - 0 Lower Extremity Assessment (monofilament, tuning fork, pulses) []  - 0 Peripheral Arterial Disease Assessment (using hand held doppler) ASSESSMENTS - Ostomy and/or Continence Assessment and Care []  - 0 Incontinence Assessment and Management []  - 0 Ostomy Care Assessment and Management (repouching, etc.) PROCESS - Coordination of Care X - Simple Patient / Family Education for ongoing care 1 15 []  - 0 Complex (extensive) Patient / Family Education for ongoing care []  - 0 Staff obtains 07-16-1971, Records, T Results / Process Orders est []  - 0 Staff telephones HHA, Nursing Homes / Clarify orders / etc []  - 0 Routine Transfer to another Facility (non-emergent condition) []  - 0 Routine Hospital Admission (non-emergent condition) []  - 0 New Admissions / / Ordering NPWT Apligraf, etc. , []  - 0 Emergency Hospital Admission (emergent condition) X- 1 10 Simple Discharge Coordination []  - 0 Complex (extensive) Discharge Coordination PROCESS - Special Needs []  - 0 Pediatric / Minor Patient Management []  - 0 Isolation Patient Management []  - 0 Hearing / Language / Visual special needs []  - 0 Assessment of Community assistance  (transportation, D/C planning, etc.) []  - 0 Additional assistance / Altered mentation []  - 0 Support Surface(s) Assessment (bed, cushion, seat, etc.) INTERVENTIONS - Wound Cleansing / Measurement X - Simple Wound Cleansing - one wound  1 5 []  - 0 Complex Wound Cleansing - multiple wounds X- 1 5 Wound Imaging (photographs - any number of wounds) []  - 0 Wound Tracing (instead of photographs) []  - 0 Simple Wound Measurement - one wound []  - 0 Complex Wound Measurement - multiple wounds INTERVENTIONS - Wound Dressings X - Small Wound Dressing one or multiple wounds 1 10 []  - 0 Medium Wound Dressing one or multiple wounds []  - 0 Large Wound Dressing one or multiple wounds []  - 0 Application of Medications - topical []  - 0 Application of Medications - injection INTERVENTIONS - Miscellaneous []  - 0 External ear exam Pann, [name] ( ) [id number] Page 3 of 8 []  - 0 Specimen Collection (cultures, biopsies, blood, body fluids, etc.) []  - 0 Specimen(s) / Culture(s) sent or taken to Lab for analysis []  - 0 Patient Transfer (multiple staff / Lift / Similar devices) []  - 0 Simple Staple / Suture removal (25 or less) []  - 0 Complex Staple / Suture removal (26 or more) []  - 0 Hypo / Hyperglycemic Management (close monitor of Blood Glucose) []  - 0 Ankle / Brachial Index (ABI) - do not check if billed separately X- 1 5 Vital Signs Has the patient been seen at the hospital within the last three years: Yes Total Score: 70 Level Of Care: New/Established - Level 2 Electronic Signature(s) Signed: 09/15/2022 5:06:55 PM By: Entered By: on 09/14/2022 08:35:58 -------------------------------------------------------------------------------- Encounter Discharge Information Details Patient Name: Date of Service: [name] 09/14/2022 8:15 A M Medical Record Number: [id number] Patient Account Number: [birth year]-16-1971 Date  of Birth/Sex: Treating RN: 1955-12-14 (65 y.o. Primary Care Totiana Everson: Michiel Sites Other Clinician: Referring Lanell Dubie: Treating Advay Volante/Extender: Weeks in Treatment: 64 Encounter Discharge Information Items Discharge Condition: Stable Ambulatory Status: Ambulatory Discharge Destination: Home Transportation: Private Auto Accompanied By: self Schedule Follow-up Appointment: No Clinical Summary of Care: Electronic Signature(s) Signed: 09/15/2022 5:06:55 PM By: 09/17/2022 Entered By: [name] [name] on 09/14/2022 12:59:19 -------------------------------------------------------------------------------- Lower Extremity Assessment Details Patient Name: Date of Service: [name] [name] [name] [name] 09/14/2022 8:15 A 09/16/2022 ([id number]) [id number] Page 4 of 8 Medical Record Number: 10/24/1956 Patient Account Number: 07-29-1989 Date of Birth/Sex: Treating RN: [birth year] (65 y.o. [name] [name]) [name] [name] Primary Care Janari Yamada: 57 Other Clinician: 09/17/2022 Referring Julieana Eshleman: Treating [place] Dube/Extender: [name] [name] Weeks in Treatment: 26 Electronic Signature(s) Signed: 09/15/2022 5:06:55 PM By: [name] Mealy Signed: 09/16/2022 1:14:49 PM By: Lisabeth Register RN Entered By: [id number] on 09/14/2022 08:20:36 -------------------------------------------------------------------------------- Multi Wound Chart Details Patient Name: Date of Service: [id number] [name] 09/14/2022 8:15 A M Medical Record Number: 10/24/1956 Patient Account Number: 07-29-1989 Date of Birth/Sex: Treating RN: 11-01-1955 (65 y.o. [name] [name]) [name] [name] Primary Care Steen Bisig: [name] [name] Other Clinician: 57 Referring Nikayla Madaris: Treating Shauni Henner/Extender: 09/17/2022 Weeks in Treatment: 75 Vital Signs Height(in): 74 Pulse(bpm): 84 Weight(lbs): 226 Blood  Pressure(mmHg): 126/78 Body Mass Index(BMI): 29 Temperature(F): 97.9 Respiratory Rate(breaths/min): 16 [1:Photos:] [N/A:N/A] Right, Plantar Foot N/A N/A Wound Location: Blister N/A N/A Wounding Event: Diabetic Wound/Ulcer of the Lower N/A N/A Primary Etiology: Extremity Type II Diabetes, Osteoarthritis N/A N/A Comorbid History: 07/15/2020 N/A N/A Date Acquired: 28 N/A N/A Weeks of Treatment: Open N/A N/A Wound Status: No N/A N/A Wound Recurrence: 0.1x0.1x0.1 N/A N/A Measurements L x W x D (cm) 0.008 N/A N/A A (cm) : rea 0.001 N/A N/A Volume (cm) : 99.30% N/A N/A % Reduction in A rea: 99.90%  N/A N/A % Reduction in Volume: Grade 2 N/A N/A Classification: None Present N/A N/A Exudate A mount: Thickened N/A N/A Wound Margin: None Present (0%) N/A N/A Granulation A mount: None Present (0%) N/A N/A Necrotic A mount: Fat Layer (Subcutaneous Tissue): Yes N/A N/A Exposed Structures: Fascia: No Tendon: No Muscle: No Joint: No Bone: No [place], Julian ([id number]) [id number] Page 5 of 8 None N/A N/A Epithelialization: Treatment Notes Electronic Signature(s) Signed: 09/15/2022 5:06:55 PM By: [name] [name] Entered By: [name] [name] on 09/14/2022 08:20:48 -------------------------------------------------------------------------------- Multi-Disciplinary Care Plan Details Patient Name: Date of Service: [name] [name] 09/14/2022 8:15 A M Medical Record Number: [id number] Patient Account Number: [id number] Date of Birth/Sex: Treating RN: [birth year] (65 y.o. [name] [name]) [name] [name] Primary Care Sheyla Zaffino: [name] [name] Other Clinician: Betha [name] Referring Juanice Warburton: Treating Jaynie Hitch/Extender: [name] [name] Weeks in Treatment: 45 Active Inactive Electronic Signature(s) Signed: 09/15/2022 5:06:55 PM By: [name] [name] Signed: 09/16/2022 1:14:49 PM By: [name] Pax RN Entered By: [name] [name] on 09/14/2022  08:43:22 -------------------------------------------------------------------------------- Pain Assessment Details Patient Name: Date of Service: [name] [name] 09/14/2022 8:15 A M Medical Record Number: [id number] Patient Account Number: [id number] Date of Birth/Sex: Treating RN: 1956/03/06 (65 y.o. [name] Primary Care Aymar Whitfill: [name] [name] Other Clinician: Betha [name] Referring Braysen Cloward: Treating Sujey Gundry/Extender: [name] [name] Weeks in Treatment: 30 Active Problems Location of Pain Severity and Description of Pain Patient Has Paino No Site Locations [place], [place] ([id number]) [id number] Page 6 of 8 Pain Management and Medication Current Pain Management: Electronic Signature(s) Signed: 09/15/2022 5:06:55 PM By: [name] [name] Signed: 09/16/2022 1:14:49 PM By: [name] Pax RN Entered By: [name] [name] on 09/14/2022 08:14:51 -------------------------------------------------------------------------------- Patient/Caregiver Education Details Patient Name: Date of Service: [name] [name] 11/15/2023andnbsp8:15 A M Medical Record Number: [id number] Patient Account Number: [id number] Date of Birth/Gender: Treating RN: May 22, 1956 (65 y.o. [name] Primary Care Physician: [name] [name] Other Clinician: Betha [name] Referring Physician: Treating Physician/Extender: [name] [name] Weeks in Treatment: 56 Education Assessment Education Provided To: Patient Education Topics Provided Wound/Skin Impairment: Handouts: Other: your wound has healed. Please call if any further issues arise Methods: Explain/Verbal Responses: State content correctly Electronic Signature(s) Signed: 09/15/2022 5:06:55 PM By: [name] [name] Entered By: [name] [name] on 09/14/2022 08:44:13 [name] ([id number]) [id number] Page 7 of  8 -------------------------------------------------------------------------------- Wound Assessment Details Patient Name: Date of Service: [name] [name] [name] [name] 09/14/2022 8:15 A M Medical Record Number: [id number] Patient Account Number: [id number] Date of Birth/Sex: Treating RN: [birth year] (65 y.o. [name] [name]) [name] [name] Primary Care Trust Crago: [name] [name] Other Clinician: Betha [name] Referring Averey Koning: Treating Swain Acree/Extender: [name] [name] Weeks in Treatment: 53 Wound Status Wound Number: 1 Primary Etiology: Diabetic Wound/Ulcer of the Lower Extremity Wound Location: Right, Plantar Foot Wound Status: Healed - Epithelialized Wounding Event: Blister Comorbid History: Type II Diabetes, Osteoarthritis Date Acquired: 07/15/2020 Weeks Of Treatment: 44 Clustered Wound: No Photos Wound Measurements Length: (cm) Width: (cm) Depth: (cm) Area: (cm) Volume: (cm) 0 % Reduction in Area: 100% 0 % Reduction in Volume: 100% 0 Epithelialization: None 0 Tunneling: No 0 Undermining: No Wound Description Classification: Grade 2 Wound Margin: Thickened Exudate Amount: None Present Foul Odor After Cleansing: No Slough/Fibrino No Wound Bed Granulation Amount: None Present (0%) Exposed Structure Necrotic Amount: None Present (0%) Fascia Exposed: No Fat Layer (Subcutaneous Tissue) Exposed: Yes Tendon Exposed: No Muscle Exposed: No Joint Exposed: No Bone Exposed: No Treatment Notes Wound #1 (Foot) Wound Laterality: Plantar, Right Cleanser Peri-Wound Care Topical Primary Dressing Albarracin, Darrly ([id number]) [id number] Page 8  of 8 Secondary Dressing Secured With Compression Wrap Compression Stockings Add-Ons Electronic Signature(s) Signed: 09/15/2022 5:06:55 PM By: [name] LoaVenable, Angie Signed: 09/16/2022 1:14:49 PM By: [name] PaxEpps, Carrie RN Entered By: [name] LoaVenable, Angie on 09/14/2022  08:33:44 -------------------------------------------------------------------------------- Vitals Details Patient Name: Date of Service: [name] MealyNEELY, [name] [name] 09/14/2022 8:15 A M Medical Record Number: [id number] Patient Account Number: [id number] Date of Birth/Sex: Treating RN: May 17, 1956 (65 y.o. [name] PetitM) [name] PaxEpps, Carrie Primary Care Bader Stubblefield: [name] AllanFields, Glenda Other Clinician: Betha LoaVenable, Angie Referring Iam Lipson: Treating Laine Giovanetti/Extender: [name] ScotlandHoffman, Jessica Fields, Glenda Weeks in Treatment: 3444 Vital Signs Time Taken: 08:13 Temperature (F): 97.9 Height (in): 74 Pulse (bpm): 84 Weight (lbs): 226 Respiratory Rate (breaths/min): 16 Body Mass Index (BMI): 29 Blood Pressure (mmHg): 126/78 Reference Range: 80 - 120 mg / dl Electronic Signature(s) Signed: 09/15/2022 5:06:55 PM By: [name] LoaVenable, Angie Entered By: [name] LoaVenable, Angie on 09/14/2022 08:14:44

## 2022-09-20 NOTE — Progress Notes (Signed)
  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 male returns for post-op check.  Patient states he is doing okay.  He has been putting weight to the heel.  He denies any other acute complaints bandages clean dry and intact.  Review of Systems: Negative except as noted in the HPI. Denies N/V/F/Ch.  Past Medical History:  Diagnosis Date   Diabetes mellitus without complication (HCC)    Gout     Current Outpatient Medications:    allopurinol (ZYLOPRIM) 300 MG tablet, Take 300 mg by mouth daily., Disp: , Rfl:    atorvastatin (LIPITOR) 80 MG tablet, Take 1 tablet (80 mg total) by mouth daily., Disp: 90 tablet, Rfl: 0   empagliflozin (JARDIANCE) 25 MG TABS tablet, Take 25 mg by mouth daily., Disp: , Rfl:    gabapentin (NEURONTIN) 400 MG capsule, Take 400 mg by mouth 3 (three) times daily., Disp: , Rfl:    lisinopril (ZESTRIL) 5 MG tablet, Take 5 mg by mouth daily., Disp: , Rfl:    metFORMIN (GLUCOPHAGE) 500 MG tablet, Take 1,000 mg by mouth 2 (two) times daily with a meal., Disp: , Rfl:    QUEtiapine (SEROQUEL) 25 MG tablet, Take 25 mg by mouth at bedtime., Disp: , Rfl:    rOPINIRole (REQUIP) 0.5 MG tablet, Take 0.5 mg by mouth at bedtime., Disp: , Rfl:    Semaglutide 14 MG TABS, Take 14 mg by mouth daily., Disp: , Rfl:    sildenafil (VIAGRA) 100 MG tablet, Take 100 mg by mouth daily as needed for erectile dysfunction., Disp: , Rfl:    testosterone cypionate (DEPOTESTOSTERONE CYPIONATE) 200 MG/ML injection, Inject into the muscle every 14 (fourteen) days., Disp: , Rfl:   Social History   Tobacco Use  Smoking Status Former   Types: Cigarettes   Quit date: 10/07/1997   Years since quitting: 24.9  Smokeless Tobacco Never    No Known Allergies Objective:  There were no vitals filed for  this visit. There is no height or weight on file to calculate BMI. Constitutional Well developed. Well nourished.  Vascular Foot warm and well perfused. Capillary refill normal to all digits.   Neurologic Normal speech. Oriented to person, place, and time. Epicritic sensation to light touch grossly present bilaterally.  Dermatologic Skin completely reepithelialized.  No signs of Deis is noted no complication noted.  Orthopedic: Tenderness to palpation noted about the surgical site.   Radiographs: None Assessment:   No diagnosis found.  Plan:  Patient was evaluated and treated and all questions answered.  S/p foot surgery left -Clinically healed and is doing well.  At this time patient will benefit from a toe filler for the amputation stump site.  [facility] prescription was given so patient can obtain a transmetatarsal filler. -I will see him back again 1 more time in about 3 weeks.  No follow-ups on file.

## 2022-09-21 ENCOUNTER — Encounter: Payer: Medicare Other | Admitting: Internal Medicine

## 2022-09-28 ENCOUNTER — Encounter: Payer: Medicare Other | Admitting: Internal Medicine

## 2022-10-06 ENCOUNTER — Encounter: Payer: Medicare Other | Admitting: Podiatry

## 2022-12-20 DIAGNOSIS — R9431 Abnormal electrocardiogram [ECG] [EKG]: Secondary | ICD-10-CM | POA: Insufficient documentation

## 2022-12-20 DIAGNOSIS — Z87891 Personal history of nicotine dependence: Secondary | ICD-10-CM | POA: Insufficient documentation

## 2023-01-14 ENCOUNTER — Other Ambulatory Visit: Payer: Self-pay

## 2023-01-14 ENCOUNTER — Emergency Department
Admission: EM | Admit: 2023-01-14 | Discharge: 2023-01-14 | Disposition: A | Discharge status: Home / Self Care | Payer: Medicare Other | Attending: Emergency Medicine | Admitting: Emergency Medicine

## 2023-01-14 ENCOUNTER — Encounter: Payer: Self-pay | Admitting: Intensive Care

## 2023-01-14 DIAGNOSIS — K91840 Postprocedural hemorrhage and hematoma of a digestive system organ or structure following a digestive system procedure: Secondary | ICD-10-CM | POA: Diagnosis not present

## 2023-01-14 DIAGNOSIS — Z8673 Personal history of transient ischemic attack (TIA), and cerebral infarction without residual deficits: Secondary | ICD-10-CM | POA: Insufficient documentation

## 2023-01-14 DIAGNOSIS — N183 Chronic kidney disease, stage 3 unspecified: Secondary | ICD-10-CM | POA: Diagnosis not present

## 2023-01-14 DIAGNOSIS — D72829 Elevated white blood cell count, unspecified: Secondary | ICD-10-CM | POA: Diagnosis not present

## 2023-01-14 DIAGNOSIS — E1122 Type 2 diabetes mellitus with diabetic chronic kidney disease: Secondary | ICD-10-CM | POA: Diagnosis not present

## 2023-01-14 DIAGNOSIS — Y838 Other surgical procedures as the cause of abnormal reaction of the patient, or of later complication, without mention of misadventure at the time of the procedure: Secondary | ICD-10-CM | POA: Insufficient documentation

## 2023-01-14 LAB — CBC WITH DIFFERENTIAL/PLATELET
Abs Immature Granulocytes: 0.04 10*3/uL (ref 0.00–0.07)
Basophils Absolute: 0.1 10*3/uL (ref 0.0–0.1)
Basophils Relative: 1 %
Eosinophils Absolute: 0.2 10*3/uL (ref 0.0–0.5)
Eosinophils Relative: 2 %
HCT: 47.9 % (ref 39.0–52.0)
Hemoglobin: 15.5 g/dL (ref 13.0–17.0)
Immature Granulocytes: 0 %
Lymphocytes Relative: 14 %
Lymphs Abs: 1.8 10*3/uL (ref 0.7–4.0)
MCH: 29.3 pg (ref 26.0–34.0)
MCHC: 32.4 g/dL (ref 30.0–36.0)
MCV: 90.5 fL (ref 80.0–100.0)
Monocytes Absolute: 0.6 10*3/uL (ref 0.1–1.0)
Monocytes Relative: 5 %
Neutro Abs: 9.8 10*3/uL — ABNORMAL HIGH (ref 1.7–7.7)
Neutrophils Relative %: 78 %
Platelets: 257 10*3/uL (ref 150–400)
RBC: 5.29 MIL/uL (ref 4.22–5.81)
RDW: 14.8 % (ref 11.5–15.5)
WBC: 12.5 10*3/uL — ABNORMAL HIGH (ref 4.0–10.5)
nRBC: 0 % (ref 0.0–0.2)

## 2023-01-14 LAB — COMPREHENSIVE METABOLIC PANEL
ALT: 19 U/L (ref 0–44)
AST: 24 U/L (ref 15–41)
Albumin: 4.2 g/dL (ref 3.5–5.0)
Alkaline Phosphatase: 91 U/L (ref 38–126)
Anion gap: 10 (ref 5–15)
BUN: 32 mg/dL — ABNORMAL HIGH (ref 8–23)
CO2: 24 mmol/L (ref 22–32)
Calcium: 9.8 mg/dL (ref 8.9–10.3)
Chloride: 99 mmol/L (ref 98–111)
Creatinine, Ser: 1.35 mg/dL — ABNORMAL HIGH (ref 0.61–1.24)
GFR, Estimated: 57 mL/min — ABNORMAL LOW (ref >60)
Glucose, Bld: 240 mg/dL — ABNORMAL HIGH (ref 70–99)
Potassium: 4.6 mmol/L (ref 3.5–5.1)
Sodium: 133 mmol/L — ABNORMAL LOW (ref 135–145)
Total Bilirubin: 1.1 mg/dL (ref 0.3–1.2)
Total Protein: 8.2 g/dL — ABNORMAL HIGH (ref 6.5–8.1)

## 2023-01-14 MED ORDER — TRANEXAMIC ACID FOR EPISTAXIS
500.0000 mg | Freq: Once | TOPICAL | Status: AC
  Administered 2023-01-14: 500 mg via TOPICAL
  Filled 2023-01-14: qty 10

## 2023-01-14 MED ORDER — SODIUM CHLORIDE 0.9 % IV BOLUS
1000.0000 mL | Freq: Once | INTRAVENOUS | Status: AC
  Administered 2023-01-14: 1000 mL via INTRAVENOUS

## 2023-01-14 NOTE — ED Triage Notes (Signed)
Patient had dental surgery Thursday and reports area started bleeding last night.

## 2023-01-14 NOTE — Discharge Instructions (Addendum)
Follow-up with your dentist on Monday. Do not use a straw to drink with and if you are a smoker or vape discontinue until after you have seen your dentist.  Both these activities will increase your chances for bleeding.  Leave the packing and the gum until seen by your dentist.  [name] any food on the opposite side of your mouth.

## 2023-01-14 NOTE — ED Notes (Addendum)
Note was entered on wrong pt. 

## 2023-01-14 NOTE — ED Notes (Signed)
A tea bag was provided for the pt to bite down on and help with controlling his bleeding.

## 2023-01-14 NOTE — ED Notes (Signed)
Pt to ED ambulatory unlabored respirations. Had 2 bottom molars surgically removed 2 days ago. PA saw pt at bedside.

## 2023-01-14 NOTE — ED Notes (Signed)
PA informed, pt still bleeding after topical TXA and pressure. Clot has formed. Pt is swallowing blood. Airway intact.

## 2023-01-14 NOTE — ED Provider Notes (Signed)
[facility] Provider Note    Event Date/Time   First MD Initiated Contact with Patient 01/14/23 [phone]     (approximate)   History   Dental Problem   HPI  Penny Mccadden is a 67 y.o. male   presents to the ED with complaint of gums bleeding from where he had dental surgery on Thursday.  Patient states he has not had any bleeding until he woke up this morning.  He also reports black tarry stools.  He denies any blood thinners or use of routine aspirin.  Patient has a history of AKI, hyponatremia, diabetes type 2, CVA, stage III chronic kidney disease.      Physical Exam   Triage Vital Signs: ED Triage Vitals [01/14/23 0904]  Enc Vitals Group     BP      Pulse      Resp      Temp      Temp src      SpO2      Weight 225 lb (102.1 kg)     Height 6\' 2"  (1.88 m)     Head Circumference      Peak Flow      Pain Score 0     Pain Loc      Pain Edu?      Excl. in [place]?     Most recent vital signs: Vitals:   01/14/23 0907 01/14/23 1246  BP:  122/76  Pulse: (!) 108 (!) 101  Resp:  14  Temp:    SpO2: 96% 97%     General: Awake, no distress.  CV:  Good peripheral perfusion.  Resp:  Normal effort.  Abd:  No distention.  Other:  Patient had extraction of the left lower posterior molars.  There is bleeding from this area.  On initial examination there is a clot present on some gauze that patient had in his mouth.   ED Results / Procedures / Treatments   Labs (all labs ordered are listed, but only abnormal results are displayed) Labs Reviewed  CBC WITH DIFFERENTIAL/PLATELET - Abnormal; Notable for the following components:      Result Value   WBC 12.5 (*)    Neutro Abs 9.8 (*)    All other components within normal limits  COMPREHENSIVE METABOLIC PANEL - Abnormal; Notable for the following components:   Sodium 133 (*)    Glucose, Bld 240 (*)    BUN 32 (*)    Creatinine, Ser 1.35 (*)    Total Protein 8.2 (*)    GFR, Estimated 57 (*)    All  other components within normal limits       PROCEDURES:  Critical Care performed:   Procedures   MEDICATIONS ORDERED IN ED: Medications  sodium chloride 0.9 % bolus 1,000 mL (0 mLs Intravenous Stopped 01/14/23 1237)  tranexamic acid (CYKLOKAPRON) 1000 MG/10ML topical solution 500 mg (500 mg Topical Given 01/14/23 1043)     IMPRESSION / MDM / ASSESSMENT AND PLAN / ED COURSE  I reviewed the triage vital signs and the nursing notes.   Differential diagnosis includes, but is not limited to, postprocedural dental bleeding.  67 year old male presents to the ED with complaint of bleeding from his gums that began this morning after a dental procedure that was done 3 days ago.  Labs were done and were reassuring with platelets in normal limits.  Patient was made aware that his glucose nonfasting was 240 which patient states that he is aware  due to eating peaches this morning prior to coming to the emergency department.  BUN and creatinine were elevated and consistent with his chronic kidney disease.  WBC elevated slightly at 12.5.  Patient was given a liter of fluids and initially we tried soaking a tea bag and putting pressure on his gums with this which was supposed to have stayed in for approximately 30 to 40 minutes however patient removed this and approximately 15 minutes due to continued bleeding.  TXA was soaked on a gauze and placed in his mouth with instructions to leave this for approximately 40 minutes and patient removed this in less than 20 minutes.  I spoke with Dr. Charna Archer who is in pod D.  A piece of Surgicel gauze was placed into the socket that was bleeding and held pressure with some gauze on top.  Patient was observed until time of his discharge and had no recurrent bleeding.  Patient was made aware that he should not drink anything with straw and if he smokes or vapes to discontinue as this will encourage more bleeding from this area.  He is to follow-up with his PCP on Monday if  any continued problems.      Patient's presentation is most consistent with acute complicated illness / injury requiring diagnostic workup.  FINAL CLINICAL IMPRESSION(S) / ED DIAGNOSES   Final diagnoses:  Postprocedural hemorrhage due to complication of oral surgery     Rx / DC Orders   ED Discharge Orders     None        Note:  This document was prepared using Dragon voice recognition software and may include unintentional dictation errors.   Johnn Hai, PA-C 01/14/23 1450    Lucillie Garfinkel, MD 01/14/23 [phone]

## 2023-01-19 ENCOUNTER — Telehealth: Payer: Self-pay

## 2023-01-19 NOTE — Telephone Encounter (Signed)
     Patient  visit on 3/16  at [place] you been able to follow up with your primary care physician? Yes    The patient was or was not able to obtain any needed medicine or equipment. Yes   Are there diet recommendations that you are having difficulty following? Na   Patient expresses understanding of discharge instructions and education provided has no other needs at this time. Yes      [place] [phone] [address], [place], [place] [address] Phone: [phone] Email: [email or web address]

## 2023-05-15 ENCOUNTER — Emergency Department: Payer: Medicare Other

## 2023-05-15 ENCOUNTER — Other Ambulatory Visit: Payer: Self-pay

## 2023-05-15 ENCOUNTER — Emergency Department
Admission: EM | Admit: 2023-05-15 | Discharge: 2023-05-15 | Disposition: A | Discharge status: Home / Self Care | Payer: Medicare Other | Attending: Emergency Medicine | Admitting: Emergency Medicine

## 2023-05-15 ENCOUNTER — Encounter: Payer: Self-pay | Admitting: Emergency Medicine

## 2023-05-15 DIAGNOSIS — M542 Cervicalgia: Secondary | ICD-10-CM | POA: Diagnosis present

## 2023-05-15 DIAGNOSIS — E1122 Type 2 diabetes mellitus with diabetic chronic kidney disease: Secondary | ICD-10-CM | POA: Insufficient documentation

## 2023-05-15 DIAGNOSIS — I509 Heart failure, unspecified: Secondary | ICD-10-CM | POA: Diagnosis not present

## 2023-05-15 DIAGNOSIS — N183 Chronic kidney disease, stage 3 unspecified: Secondary | ICD-10-CM | POA: Diagnosis not present

## 2023-05-15 DIAGNOSIS — M5412 Radiculopathy, cervical region: Secondary | ICD-10-CM | POA: Diagnosis not present

## 2023-05-15 MED ORDER — OXYCODONE-ACETAMINOPHEN 5-325 MG PO TABS
1.0000 | ORAL_TABLET | Freq: Once | ORAL | Status: AC
  Administered 2023-05-15: 1 via ORAL
  Filled 2023-05-15: qty 1

## 2023-05-15 NOTE — ED Triage Notes (Signed)
Pt sts that he has been having right arm pain. Pt is rude at triage staff. Pt sts that he just was seen at the walkin clinic at [facility] and he was told to come to the ED.

## 2023-05-15 NOTE — ED Provider Notes (Signed)
[facility] Provider Note    Event Date/Time   First MD Initiated Contact with Patient 05/15/23 1349     (approximate)   History   Arm Pain   HPI  [name] is a 67 y.o. male with diabetes, gout, stage III kidney disease, CHF, CVA, osteomyelitis of the left foot presents emergency department complaining of neck pain that radiates to the right arm with numbness and tingling.  States worsening discharge straightness neck.  No known injury.  States that sharp shooting pain and tingling into his fingers.  States it is fine if he is leaning his head forward      Physical Exam   Triage Vital Signs: ED Triage Vitals [05/15/23 1222]  Encounter Vitals Group     BP 120/76     Systolic BP Percentile      Diastolic BP Percentile      Pulse Rate 83     Resp 18     Temp 98 F (36.7 C)     Temp Source Oral     SpO2 95 %     Weight 225 lb (102.1 kg)     Height 6\' 2"  (1.88 m)     Head Circumference      Peak Flow      Pain Score 6     Pain Loc      Pain Education      Exclude from Growth Chart     Most recent vital signs: Vitals:   05/15/23 1222  BP: 120/76  Pulse: 83  Resp: 18  Temp: 98 F (36.7 C)  SpO2: 95%     General: Awake, no distress.   CV:  Good peripheral perfusion. regular rate and  rhythm Resp:  Normal effort.  Abd:  No distention.   Other:  Tender along C-spine and right trapezius, grip decreased on the right hand when compared to left, neurovascular is intact   ED Results / Procedures / Treatments   Labs (all labs ordered are listed, but only abnormal results are displayed) Labs Reviewed - No data to display   EKG     RADIOLOGY CT cervical spine    PROCEDURES:   Procedures   MEDICATIONS ORDERED IN ED: Medications  oxyCODONE-acetaminophen (PERCOCET/ROXICET) 5-325 MG per tablet 1 tablet (1 tablet Oral Given 05/15/23 1419)     IMPRESSION / MDM / ASSESSMENT AND PLAN / ED COURSE  I reviewed the triage  vital signs and the nursing notes.                              Differential diagnosis includes, but is not limited to, cervical radiculopathy, muscle spasm, diabetic neuropathy  Patient's presentation is most consistent with acute complicated illness / injury requiring diagnostic workup.   CT cervical spine, patient was given Percocet p.o. for pain.   CT of the cervical spine, I did independently review the radiologist report, I interpret this is being negative for any acute abnormality.  Went to explain the findings to the patient he was no longer in the room.   FINAL CLINICAL IMPRESSION(S) / ED DIAGNOSES   Final diagnoses:  Cervical radiculopathy     Rx / DC Orders   ED Discharge Orders     None        Note:  This document was prepared using Dragon voice recognition software and may include unintentional dictation errors.    Faythe Ghee,  PA-C 05/15/23 1515    Jene Every, MD 05/15/23 1534

## 2023-05-18 ENCOUNTER — Ambulatory Visit
Admission: RE | Admit: 2023-05-18 | Discharge: 2023-05-18 | Disposition: A | Discharge status: Home / Self Care | Payer: Medicare Other | Source: Ambulatory Visit | Attending: Physician Assistant | Admitting: Physician Assistant

## 2023-05-18 ENCOUNTER — Other Ambulatory Visit: Payer: Self-pay | Admitting: Physician Assistant

## 2023-05-18 DIAGNOSIS — M5412 Radiculopathy, cervical region: Secondary | ICD-10-CM

## 2023-05-29 ENCOUNTER — Other Ambulatory Visit: Payer: Self-pay | Admitting: Family Medicine

## 2023-05-29 DIAGNOSIS — M5412 Radiculopathy, cervical region: Secondary | ICD-10-CM

## 2023-05-31 ENCOUNTER — Other Ambulatory Visit: Payer: Self-pay

## 2023-05-31 ENCOUNTER — Ambulatory Visit (INDEPENDENT_AMBULATORY_CARE_PROVIDER_SITE_OTHER): Payer: Medicare Other | Admitting: Neurosurgery

## 2023-05-31 ENCOUNTER — Encounter
Admission: RE | Admit: 2023-05-31 | Discharge: 2023-05-31 | Disposition: A | Discharge status: Home / Self Care | Payer: Medicare Other | Source: Ambulatory Visit | Attending: Neurosurgery | Admitting: Neurosurgery

## 2023-05-31 ENCOUNTER — Encounter: Payer: Self-pay | Admitting: Neurosurgery

## 2023-05-31 VITALS — BP 126/84 | Ht 74.0 in | Wt 235.0 lb

## 2023-05-31 DIAGNOSIS — N1831 Chronic kidney disease, stage 3a: Secondary | ICD-10-CM | POA: Diagnosis not present

## 2023-05-31 DIAGNOSIS — Z01812 Encounter for preprocedural laboratory examination: Secondary | ICD-10-CM

## 2023-05-31 DIAGNOSIS — M5412 Radiculopathy, cervical region: Secondary | ICD-10-CM

## 2023-05-31 DIAGNOSIS — M5023 Other cervical disc displacement, cervicothoracic region: Secondary | ICD-10-CM | POA: Diagnosis not present

## 2023-05-31 DIAGNOSIS — Z01818 Encounter for other preprocedural examination: Secondary | ICD-10-CM | POA: Insufficient documentation

## 2023-05-31 DIAGNOSIS — M5020 Other cervical disc displacement, unspecified cervical region: Secondary | ICD-10-CM

## 2023-05-31 DIAGNOSIS — Z01810 Encounter for preprocedural cardiovascular examination: Secondary | ICD-10-CM | POA: Diagnosis not present

## 2023-05-31 DIAGNOSIS — E119 Type 2 diabetes mellitus without complications: Secondary | ICD-10-CM | POA: Insufficient documentation

## 2023-05-31 DIAGNOSIS — R29898 Other symptoms and signs involving the musculoskeletal system: Secondary | ICD-10-CM | POA: Diagnosis not present

## 2023-05-31 HISTORY — DX: Chronic diastolic (congestive) heart failure: I50.32

## 2023-05-31 HISTORY — DX: Chronic kidney disease, stage 3a: N18.31

## 2023-05-31 HISTORY — DX: Acidosis, unspecified: E87.20

## 2023-05-31 HISTORY — DX: Depression, unspecified: F32.A

## 2023-05-31 HISTORY — DX: Hemiplegia and hemiparesis following cerebral infarction affecting left non-dominant side: I69.354

## 2023-05-31 HISTORY — DX: Heart failure, unspecified: I50.9

## 2023-05-31 HISTORY — DX: Other acute osteomyelitis, left ankle and foot: M86.172

## 2023-05-31 HISTORY — DX: Hyperlipidemia, unspecified: E78.5

## 2023-05-31 HISTORY — DX: Dysarthria and anarthria: R47.1

## 2023-05-31 HISTORY — DX: Hypo-osmolality and hyponatremia: E87.1

## 2023-05-31 HISTORY — DX: Cellulitis of left toe: L03.032

## 2023-05-31 HISTORY — DX: Osteomyelitis, unspecified: M86.9

## 2023-05-31 HISTORY — DX: Cerebral infarction, unspecified: I63.9

## 2023-05-31 HISTORY — DX: Local infection of the skin and subcutaneous tissue, unspecified: L08.9

## 2023-05-31 HISTORY — DX: Chronic osteomyelitis with draining sinus, left ankle and foot: M86.472

## 2023-05-31 HISTORY — DX: Type 2 diabetes mellitus with other diabetic kidney complication: E11.29

## 2023-05-31 HISTORY — DX: Acute kidney failure, unspecified: N17.9

## 2023-05-31 LAB — CBC
HCT: 49.8 % (ref 39.0–52.0)
Hemoglobin: 16.7 g/dL (ref 13.0–17.0)
MCH: 30.8 pg (ref 26.0–34.0)
MCHC: 33.5 g/dL (ref 30.0–36.0)
MCV: 91.7 fL (ref 80.0–100.0)
Platelets: 209 10*3/uL (ref 150–400)
RBC: 5.43 MIL/uL (ref 4.22–5.81)
RDW: 15.5 % (ref 11.5–15.5)
WBC: 9.2 10*3/uL (ref 4.0–10.5)
nRBC: 0 % (ref 0.0–0.2)

## 2023-05-31 LAB — TYPE AND SCREEN
ABO/RH(D): O NEG
Antibody Screen: NEGATIVE

## 2023-05-31 LAB — BASIC METABOLIC PANEL
Anion gap: 8 (ref 5–15)
BUN: 26 mg/dL — ABNORMAL HIGH (ref 8–23)
CO2: 24 mmol/L (ref 22–32)
Calcium: 9.3 mg/dL (ref 8.9–10.3)
Chloride: 102 mmol/L (ref 98–111)
Creatinine, Ser: 1.24 mg/dL (ref 0.61–1.24)
GFR, Estimated: >60 mL/min (ref >60)
Glucose, Bld: 193 mg/dL — ABNORMAL HIGH (ref 70–99)
Potassium: 4.1 mmol/L (ref 3.5–5.1)
Sodium: 134 mmol/L — ABNORMAL LOW (ref 135–145)

## 2023-05-31 LAB — SURGICAL PCR SCREEN
MRSA, PCR: NEGATIVE
Staphylococcus aureus: NEGATIVE

## 2023-05-31 MED ORDER — METHYLPREDNISOLONE 4 MG PO TBPK: ORAL_TABLET | ORAL | 0 refills | Status: DC

## 2023-05-31 NOTE — Patient Instructions (Signed)
Please see below for information in regards to your upcoming surgery:   Planned surgery: Right minimally invasive (MIS) C8-T1 discectomy    Surgery date: 06/05/23 at [facility] (Medical Mall: [address], [place], [place] [address]) - you will find out your arrival time the business day before your surgery.   Pre-op appointment at [facility] Pre-admit Testing: we will call you with a date/time for this. Pre-admit testing is located on the first floor of the [facility] building, [address] [facility] [address], [address]. Please bring all prescriptions in the original prescription bottles to your appointment, even if you have reviewed medications by phone with a pharmacy representative. During this appointment, they will advise you which medications you can take the morning of surgery, and which medications you will need to hold for surgery. Pre-op labs may be done at your pre-op appointment. You are not required to fast for these labs. Should you need to change your pre-op appointment, please call Pre-admit testing at [phone].     Diabetes medications: Semaglutide (Rybelsus) oral: hold for 3 days prior to surgery Empagliflozin (Jardiance): hold for 3 days prior to surgery Metformin: hold for 2 days prior to surgery     Surgical clearance: we will send a clearance form to Manson Allan, NP     Home health physical therapy: [name] will contact you regarding home health physical therapy for after surgery.Their number is [phone].    Common restrictions after surgery: No bending, lifting, or twisting ("BLT"). Avoid lifting objects heavier than 10 pounds for the first 6 weeks after surgery. Where possible, avoid household activities that involve lifting, bending, reaching, pushing, or pulling such as laundry, vacuuming, grocery shopping, and childcare. Try to arrange for help from friends and family for these activities  while you heal. Do not drive while taking prescription pain medication. Weeks 6 through 12 after surgery: avoid lifting more than 25 pounds.     How to contact us:  If you have any questions/concerns before or after surgery, you can reach [place] at [phone], or you can send a mychart message. We can be reached by phone or mychart 8am-4pm, Monday-Friday.  *Please note: Calls after 4pm are forwarded to a third party answering service. Mychart messages are not routinely monitored during evenings, weekends, and holidays. Please call our office to contact the answering service for urgent concerns during non-business hours.     Appointments/FMLA & disability paperwork: Patty [employer] Cristin  Nurse: Royston Cowper  Medical assistants: Laurann Montana, & Lyla Son Physician Assistants: Manning Charity & Drake Leach Surgeons: Venetia Night, MD & Ernestine Mcmurray, MD

## 2023-05-31 NOTE — H&P (View-Only) (Signed)
Referring Physician:  Denton Lank, FNP [address] [place],  [place] [address]  Primary Physician:  Alm Bustard, NP  History of Present Illness: 05/31/2023 [name] is here today with a chief complaint of cervical radiculopathy.  Approximately 2 to 3 weeks ago he developed severe pain in his neck radiating down his right arm.  He is also developed right hand weakness and feels that he is losing the ability to open and close his hand.  He states that anytime he lifts his head he gets severe shooting pain down the medial aspect of his right upper arm.  Rarely this will go down to his hand.  Anytime he lifts his head up he feels worse.  He has not had any steroids.  Not had any injections.  He feels that the pain is constant.  He has not had any bowel or bladder issues.  Conservative measures:  Physical therapy: no  Multimodal medical therapy including regular antiinflammatories: Tylenol, Gabapentin  Injections: No Scheduled for injection tomorrow  Past Surgery: No  The symptoms are causing a significant impact on the patient's life  I have utilized the care everywhere function in epic to review the outside records available from external health systems.  Review of Systems:  Neurologic review of systems performed demonstrating radiating pain down the medial aspect of his upper arm, Spurling sign, weakness, sensation changes in the medial aspect of his upper arm.  Past Medical History: Past Medical History:  Diagnosis Date   Diabetes mellitus without complication (HCC)    Gout     Past Surgical History: Past Surgical History:  Procedure Laterality Date   ACHILLES TENDON SURGERY Left 08/18/2022   Procedure: ACHILLES LENGTHENING/KIDNER;  Surgeon: Candelaria Stagers, DPM;  Location: [facility];  Service: Podiatry;  Laterality: Left;   AMPUTATION TOE Left 12/16/2021   Procedure: AMPUTATION TOE - Left Great;  Surgeon: Linus Galas, DPM;  Location: [facility];   Service: Podiatry;  Laterality: Left;   AMPUTATION TOE Left 01/14/2022   Procedure: GREAT TOE AMPUTATION;  Surgeon: Linus Galas, DPM;  Location: [facility];  Service: Podiatry;  Laterality: Left;   CATARACT EXTRACTION W/PHACO Left 05/24/2022   Procedure: CATARACT EXTRACTION PHACO AND INTRAOCULAR LENS PLACEMENT (IOC) LEFT 7.63 00:42.1;  Surgeon: Galen Manila, MD;  Location: [facility];  Service: Ophthalmology;  Laterality: Left;  Diabetic   CATARACT EXTRACTION W/PHACO Right 06/07/2022   Procedure: CATARACT EXTRACTION PHACO AND INTRAOCULAR LENS PLACEMENT (IOC) RIGHT;  Surgeon: Galen Manila, MD;  Location: [facility];  Service: Ophthalmology;  Laterality: Right;  7.33 0:45.3   LAPAROSCOPIC GASTRIC BANDING     TRANSMETATARSAL AMPUTATION Left 08/18/2022   Procedure: TRANSMETATARSAL AMPUTATION;  Surgeon: Candelaria Stagers, DPM;  Location: [facility];  Service: Podiatry;  Laterality: Left;    Allergies: Allergies as of 05/31/2023   (No Known Allergies)    Medications:  Current Outpatient Medications:    allopurinol (ZYLOPRIM) 300 MG tablet, Take 300 mg by mouth daily., Disp: , Rfl:    atorvastatin (LIPITOR) 80 MG tablet, Take 1 tablet (80 mg total) by mouth daily., Disp: 90 tablet, Rfl: 0   empagliflozin (JARDIANCE) 25 MG TABS tablet, Take 25 mg by mouth daily., Disp: , Rfl:    gabapentin (NEURONTIN) 400 MG capsule, Take 400 mg by mouth 3 (three) times daily., Disp: , Rfl:    lisinopril (ZESTRIL) 5 MG tablet, Take 5 mg by mouth daily., Disp: , Rfl:    metFORMIN (GLUCOPHAGE)  500 MG tablet, Take 1,000 mg by mouth 2 (two) times daily with a meal., Disp: , Rfl:    QUEtiapine (SEROQUEL) 25 MG tablet, Take 25 mg by mouth at bedtime., Disp: , Rfl:    rOPINIRole (REQUIP) 0.5 MG tablet, Take 0.5 mg by mouth at bedtime., Disp: , Rfl:    Semaglutide 14 MG TABS, Take 14 mg by mouth daily., Disp: , Rfl:    sildenafil (VIAGRA) 100 MG tablet, Take 100 mg by mouth daily as needed for erectile  dysfunction., Disp: , Rfl:    testosterone cypionate (DEPOTESTOSTERONE CYPIONATE) 200 MG/ML injection, Inject into the muscle every 14 (fourteen) days., Disp: , Rfl:   Social History: Social History   Tobacco Use   Smoking status: Former    Current packs/day: 0.00    Types: Cigarettes    Quit date: 10/07/1997    Years since quitting: 25.6   Smokeless tobacco: Never  Vaping Use   Vaping status: Never Used  Substance Use Topics   Alcohol use: Never   Drug use: Never    Family Medical History: No family history on file.  Physical Examination: There were no vitals filed for this visit.  General: Patient is in clear pain. Attention to examination is appropriate.  Neck:   Supple.  Range of motion is limited given his acute pain.  Every time he extends his neck he gets a Spurling sign  Respiratory: Patient is breathing without any difficulty.   NEUROLOGICAL:     Awake, alert, oriented to person, place, and time.  Speech is clear and fluent.   Cranial Nerves: Pupils equal round and reactive to light.  Facial tone is symmetric.  Facial sensation is symmetric. Shoulder shrug is symmetric. Tongue protrusion is midline.    Strength: Side Biceps Triceps Deltoid Interossei Grip Wrist Ext. Wrist Flex.  R 5 5 5 3 3 5 5   L 5 5 5 5 5 5 5     Reflexes are 2+ and symmetric at the biceps, triceps, brachioradialis, patella and achilles.   Hoffman's is absent. Clonus is absent  Decreased sensation in the right C8 dermatome     No evidence of dysmetria noted.  Gait is normal.    Left hand grip 55lbs. Right hand grip 20lbs. on dynamometer    Imaging: Narrative & Impression  CLINICAL DATA:  Initial evaluation for right-sided neck, shoulder, and arm pain.   EXAM: MRI CERVICAL SPINE WITHOUT CONTRAST   TECHNIQUE: Multiplanar, multisequence MR imaging of the cervical spine was performed. No intravenous contrast was administered.   COMPARISON:  Comparison made with prior study  from 05/15/2023.   FINDINGS: Alignment: Straightening of the normal cervical lordosis. Trace degenerative anterolisthesis of C5 on C6 and C7 on T1.   Vertebrae: Vertebral body height maintained without acute or chronic fracture. Bone marrow signal intensity overall within normal limits. No worrisome osseous lesions. No abnormal marrow edema.   Cord: Normal signal and morphology.   Posterior Fossa, vertebral arteries, paraspinal tissues: Unremarkable.   Disc levels:   C2-C3: Disc desiccation without significant disc bulge. Mild left-sided uncovertebral spurring. Moderate left worse than right facet arthrosis. No spinal stenosis. Moderate left C3 foraminal narrowing. Right neural foramina remains patent.   C3-C4: Minimal disc bulge with uncovertebral spurring. Moderate right with mild left facet hypertrophy. No spinal stenosis. Mild left with moderate right C4 foraminal stenosis.   C4-C5: Mild disc bulge with bilateral uncovertebral spurring. Mild to moderate bilateral facet hypertrophy. No significant spinal stenosis. Moderate left with  mild right C5 foraminal narrowing.   C5-C6: Mild disc bulge with bilateral uncovertebral spurring, worse on the right. Moderate right worse than left facet arthrosis. No significant spinal stenosis. Moderate right worse than left C6 foraminal narrowing.   C6-C7: Mild disc bulge with uncovertebral spurring. Mild bilateral facet hypertrophy. No significant spinal stenosis. Mild left C7 foraminal narrowing. Right neural foramen remains patent.   C7-T1: Degenerative intervertebral disc space narrowing. Right paracentral to subarticular disc protrusion with inferior migration (series 9, and images 5, 4). Flattening of the right ventral thecal sac. Superimposed mild facet hypertrophy. No significant spinal stenosis. Foramina themselves remain patent.   IMPRESSION: 1. Right paracentral to subarticular disc protrusion with inferior migration at  C7-T1, suspected to be the symptomatic finding given provided history. The ventral right C8 nerve root could be affected. 2. Additional multilevel cervical spondylosis with resultant multilevel foraminal narrowing as above. Notable findings include moderate left C3, right C4, left C5, and bilateral C6 foraminal stenosis.     Electronically Signed   By: Rise Mu M.D.   On: 05/18/2023 19:40       I have personally reviewed the images and agree with the above interpretation.  Medical Decision Making/Assessment and Plan: Cervical radiculopathy at C8 Patient has a right-sided cervical radiculopathy lasting approximately 2 to 3 weeks with progressive weakness in his hand.  He has noticed worsening grip strength as well as proprioception in his right hand which is his dominant hand.  On examination he has less than 50% strength in dynamometer compared to the contralateral side.  Approximately 3 out of 5 when tested to confrontatio in the C8 myotomes.  Herniated cervical intervertebral disc Herniated disc at C7-T1 causing impingement of the right-sided C8 nerve root.  Given his progressive weakness we will plan for decompression with a C7-T1 posterior discectomy on the right.  Will also give him a steroid Dosepak for short-term improvement.  Right hand weakness He will likely need occupational therapy after surgery given the weakness in his right hand.  Showing approximately 50% grip strength when compared to his contralateral side, although his right side is his dominant side. Thank you for involving me in the care of this patient.    Lovenia Kim MD/MSCR Neurosurgery

## 2023-05-31 NOTE — Progress Notes (Signed)
Referring Physician:  Denton Lank, FNP [id number] [place],  [place] [address]  Primary Physician:  Alm Bustard, NP  History of Present Illness: 05/31/2023 [name] is here today with a chief complaint of cervical radiculopathy.  Approximately 2 to 3 weeks ago he developed severe pain in his neck radiating down his right arm.  He is also developed right hand weakness and feels that he is losing the ability to open and close his hand.  He states that anytime he lifts his head he gets severe shooting pain down the medial aspect of his right upper arm.  Rarely this will go down to his hand.  Anytime he lifts his head up he feels worse.  He has not had any steroids.  Not had any injections.  He feels that the pain is constant.  He has not had any bowel or bladder issues.  Conservative measures:  Physical therapy: no  Multimodal medical therapy including regular antiinflammatories: Tylenol, Gabapentin  Injections: No Scheduled for injection tomorrow  Past Surgery: No  The symptoms are causing a significant impact on the patient's life  I have utilized the care everywhere function in epic to review the outside records available from external health systems.  Review of Systems:  Neurologic review of systems performed demonstrating radiating pain down the medial aspect of his upper arm, Spurling sign, weakness, sensation changes in the medial aspect of his upper arm.  Past Medical History: Past Medical History:  Diagnosis Date   Diabetes mellitus without complication (HCC)    Gout     Past Surgical History: Past Surgical History:  Procedure Laterality Date   ACHILLES TENDON SURGERY Left 08/18/2022   Procedure: ACHILLES LENGTHENING/KIDNER;  Surgeon: Candelaria Stagers, DPM;  Location: [facility];  Service: Podiatry;  Laterality: Left;   AMPUTATION TOE Left 12/16/2021   Procedure: AMPUTATION TOE - Left Great;  Surgeon: Linus Galas, DPM;  Location: [facility];   Service: Podiatry;  Laterality: Left;   AMPUTATION TOE Left 01/14/2022   Procedure: GREAT TOE AMPUTATION;  Surgeon: Linus Galas, DPM;  Location: [facility];  Service: Podiatry;  Laterality: Left;   CATARACT EXTRACTION W/PHACO Left 05/24/2022   Procedure: CATARACT EXTRACTION PHACO AND INTRAOCULAR LENS PLACEMENT (IOC) LEFT 7.63 00:42.1;  Surgeon: Galen Manila, MD;  Location: [facility];  Service: Ophthalmology;  Laterality: Left;  Diabetic   CATARACT EXTRACTION W/PHACO Right 06/07/2022   Procedure: CATARACT EXTRACTION PHACO AND INTRAOCULAR LENS PLACEMENT (IOC) RIGHT;  Surgeon: Galen Manila, MD;  Location: [facility];  Service: Ophthalmology;  Laterality: Right;  7.33 0:45.3   LAPAROSCOPIC GASTRIC BANDING     TRANSMETATARSAL AMPUTATION Left 08/18/2022   Procedure: TRANSMETATARSAL AMPUTATION;  Surgeon: Candelaria Stagers, DPM;  Location: [facility];  Service: Podiatry;  Laterality: Left;    Allergies: Allergies as of 05/31/2023   (No Known Allergies)    Medications:  Current Outpatient Medications:    allopurinol (ZYLOPRIM) 300 MG tablet, Take 300 mg by mouth daily., Disp: , Rfl:    atorvastatin (LIPITOR) 80 MG tablet, Take 1 tablet (80 mg total) by mouth daily., Disp: 90 tablet, Rfl: 0   empagliflozin (JARDIANCE) 25 MG TABS tablet, Take 25 mg by mouth daily., Disp: , Rfl:    gabapentin (NEURONTIN) 400 MG capsule, Take 400 mg by mouth 3 (three) times daily., Disp: , Rfl:    lisinopril (ZESTRIL) 5 MG tablet, Take 5 mg by mouth daily., Disp: , Rfl:    metFORMIN (GLUCOPHAGE)  500 MG tablet, Take 1,000 mg by mouth 2 (two) times daily with a meal., Disp: , Rfl:    QUEtiapine (SEROQUEL) 25 MG tablet, Take 25 mg by mouth at bedtime., Disp: , Rfl:    rOPINIRole (REQUIP) 0.5 MG tablet, Take 0.5 mg by mouth at bedtime., Disp: , Rfl:    Semaglutide 14 MG TABS, Take 14 mg by mouth daily., Disp: , Rfl:    sildenafil (VIAGRA) 100 MG tablet, Take 100 mg by mouth daily as needed for erectile  dysfunction., Disp: , Rfl:    testosterone cypionate (DEPOTESTOSTERONE CYPIONATE) 200 MG/ML injection, Inject into the muscle every 14 (fourteen) days., Disp: , Rfl:   Social History: Social History   Tobacco Use   Smoking status: Former    Current packs/day: 0.00    Types: Cigarettes    Quit date: 10/07/1997    Years since quitting: 25.6   Smokeless tobacco: Never  Vaping Use   Vaping status: Never Used  Substance Use Topics   Alcohol use: Never   Drug use: Never    Family Medical History: No family history on file.  Physical Examination: There were no vitals filed for this visit.  General: Patient is in clear pain. Attention to examination is appropriate.  Neck:   Supple.  Range of motion is limited given his acute pain.  Every time he extends his neck he gets a Spurling sign  Respiratory: Patient is breathing without any difficulty.   NEUROLOGICAL:     Awake, alert, oriented to person, place, and time.  Speech is clear and fluent.   Cranial Nerves: Pupils equal round and reactive to light.  Facial tone is symmetric.  Facial sensation is symmetric. Shoulder shrug is symmetric. Tongue protrusion is midline.    Strength: Side Biceps Triceps Deltoid Interossei Grip Wrist Ext. Wrist Flex.  R 5 5 5 3 3 5 5   L 5 5 5 5 5 5 5     Reflexes are 2+ and symmetric at the biceps, triceps, brachioradialis, patella and achilles.   Hoffman's is absent. Clonus is absent  Decreased sensation in the right C8 dermatome     No evidence of dysmetria noted.  Gait is normal.    Left hand grip 55lbs. Right hand grip 20lbs. on dynamometer    Imaging: Narrative & Impression  CLINICAL DATA:  Initial evaluation for right-sided neck, shoulder, and arm pain.   EXAM: MRI CERVICAL SPINE WITHOUT CONTRAST   TECHNIQUE: Multiplanar, multisequence MR imaging of the cervical spine was performed. No intravenous contrast was administered.   COMPARISON:  Comparison made with prior study  from 05/15/2023.   FINDINGS: Alignment: Straightening of the normal cervical lordosis. Trace degenerative anterolisthesis of C5 on C6 and C7 on T1.   Vertebrae: Vertebral body height maintained without acute or chronic fracture. Bone marrow signal intensity overall within normal limits. No worrisome osseous lesions. No abnormal marrow edema.   Cord: Normal signal and morphology.   Posterior Fossa, vertebral arteries, paraspinal tissues: Unremarkable.   Disc levels:   C2-C3: Disc desiccation without significant disc bulge. Mild left-sided uncovertebral spurring. Moderate left worse than right facet arthrosis. No spinal stenosis. Moderate left C3 foraminal narrowing. Right neural foramina remains patent.   C3-C4: Minimal disc bulge with uncovertebral spurring. Moderate right with mild left facet hypertrophy. No spinal stenosis. Mild left with moderate right C4 foraminal stenosis.   C4-C5: Mild disc bulge with bilateral uncovertebral spurring. Mild to moderate bilateral facet hypertrophy. No significant spinal stenosis. Moderate left with  mild right C5 foraminal narrowing.   C5-C6: Mild disc bulge with bilateral uncovertebral spurring, worse on the right. Moderate right worse than left facet arthrosis. No significant spinal stenosis. Moderate right worse than left C6 foraminal narrowing.   C6-C7: Mild disc bulge with uncovertebral spurring. Mild bilateral facet hypertrophy. No significant spinal stenosis. Mild left C7 foraminal narrowing. Right neural foramen remains patent.   C7-T1: Degenerative intervertebral disc space narrowing. Right paracentral to subarticular disc protrusion with inferior migration (series 9, and images 5, 4). Flattening of the right ventral thecal sac. Superimposed mild facet hypertrophy. No significant spinal stenosis. Foramina themselves remain patent.   IMPRESSION: 1. Right paracentral to subarticular disc protrusion with inferior migration at  C7-T1, suspected to be the symptomatic finding given provided history. The ventral right C8 nerve root could be affected. 2. Additional multilevel cervical spondylosis with resultant multilevel foraminal narrowing as above. Notable findings include moderate left C3, right C4, left C5, and bilateral C6 foraminal stenosis.     Electronically Signed   By: Rise Mu M.D.   On: 05/18/2023 19:40       I have personally reviewed the images and agree with the above interpretation.  Medical Decision Making/Assessment and Plan: Cervical radiculopathy at C8 Patient has a right-sided cervical radiculopathy lasting approximately 2 to 3 weeks with progressive weakness in his hand.  He has noticed worsening grip strength as well as proprioception in his right hand which is his dominant hand.  On examination he has less than 50% strength in dynamometer compared to the contralateral side.  Approximately 3 out of 5 when tested to confrontatio in the C8 myotomes.  Herniated cervical intervertebral disc Herniated disc at C7-T1 causing impingement of the right-sided C8 nerve root.  Given his progressive weakness we will plan for decompression with a C7-T1 posterior discectomy on the right.  Will also give him a steroid Dosepak for short-term improvement.  Right hand weakness He will likely need occupational therapy after surgery given the weakness in his right hand.  Showing approximately 50% grip strength when compared to his contralateral side, although his right side is his dominant side. Thank you for involving me in the care of this patient.    Lovenia Kim MD/MSCR Neurosurgery

## 2023-05-31 NOTE — Patient Instructions (Addendum)
Your procedure is scheduled on: 06/05/23 - MONDAY Report to the Registration Desk on the 1st floor of the [facility]. To find out your arrival time, please call [phone] between 1PM - 3PM on: 06/02/23 - FRIDAY If your arrival time is 6:00 am, do not arrive before that time as the [facility] entrance doors do not open until 6:00 am.  REMEMBER: Instructions that are not followed completely may result in serious medical risk, up to and including death; or upon the discretion of your surgeon and anesthesiologist your surgery may need to be rescheduled.  Do not eat food after midnight the night before surgery.  No gum chewing or hard candies.  You may however, drink WATER up to 2 hours before you are scheduled to arrive for your surgery. Do not drink anything within 2 hours of your scheduled arrival time.   One week prior to surgery: Stop Anti-inflammatories (NSAIDS) such as Advil, Aleve, Ibuprofen, Motrin, Naproxen, Naprosyn and Aspirin based products such as Excedrin, Goody's Powder, BC Powder.  Stop ANY OVER THE COUNTER supplements until after surgery.  You may however, continue to take Tylenol if needed for pain up until the day of surgery.  Continue taking all prescribed medications with the exception of the following:  We have instructed the patient to hold their blood thinner(s) and/or diabetes medication(s) for surgery as listed below:   Jardiance: hold 3 days prior BEGINNING 08/05. Metformin: hold 2 days prior ON 08/06. Semaglutide (oral) :hold 1 days prior ON 08/04. sildenafil (VIAGRA) HOLD 2 DAYS PRIOR BEGINNING 08/06. LANTUS SOLOSTAR INJECT ONLY 1/2 OF YOUR PRESCRIBED DOSE OF insulin ON THE NIGHT BEFORE YOUR SURGERY.   TAKE ONLY THESE MEDICATIONS THE MORNING OF SURGERY WITH A SIP OF WATER:  allopurinol (ZYLOPRIM)  buPROPion (WELLBUTRIN XL)  gabapentin (NEURONTIN)  HYDROcodone-acetaminophen  methocarbamol (ROBAXIN)    No Alcohol for 24 hours before or after  surgery.  No Smoking including e-cigarettes for 24 hours before surgery.  No chewable tobacco products for at least 6 hours before surgery.  No nicotine patches on the day of surgery.  Do not use any "recreational" drugs for at least a week (preferably 2 weeks) before your surgery.  Please be advised that the combination of cocaine and anesthesia may have negative outcomes, up to and including death. If you test positive for cocaine, your surgery will be cancelled.  On the morning of surgery brush your teeth with toothpaste and water, you may rinse your mouth with mouthwash if you wish. Do not swallow any toothpaste or mouthwash.  Use CHG Soap or wipes as directed on instruction sheet.  Do not wear jewelry, make-up, hairpins, clips or nail polish.  Do not wear lotions, powders, or perfumes.   Do not shave body hair from the neck down 48 hours before surgery.  Contact lenses, hearing aids and dentures may not be worn into surgery.  Do not bring valuables to the hospital. [facility] is not responsible for any missing/lost belongings or valuables.   Notify your doctor if there is any change in your medical condition (cold, fever, infection).  Wear comfortable clothing (specific to your surgery type) to the hospital.  After surgery, you can help prevent lung complications by doing breathing exercises.  Take deep breaths and cough every 1-2 hours. Your doctor may order a device called an Incentive Spirometer to help you take deep breaths. When coughing or sneezing, hold a pillow firmly against your incision with both hands. This is called "splinting."  Doing this helps protect your incision. It also decreases belly discomfort.  If you are being admitted to the hospital overnight, leave your suitcase in the car. After surgery it may be brought to your room.  In case of increased patient census, it may be necessary for you, the patient, to continue your postoperative care in the Same Day  Surgery department.  If you are being discharged the day of surgery, you will not be allowed to drive home. You will need a responsible individual to drive you home and stay with you for 24 hours after surgery.   If you are taking public transportation, you will need to have a responsible individual with you.  Please call the Pre-admissions Testing Dept. at [phone] if you have any questions about these instructions.  Surgery Visitation Policy:  Patients having surgery or a procedure may have two visitors.  Children under the age of 16 must have an adult with them who is not the patient.  Inpatient Visitation:    Visiting hours are 7 a.m. to 8 p.m. Up to four visitors are allowed at one time in a patient room. The visitors may rotate out with other people during the day.  One visitor age 94 or older may stay with the patient overnight and must be in the room by 8 p.m.  Pre-operative 5 CHG Bath Instructions   You can play a key role in reducing the risk of infection after surgery. Your skin needs to be as free of germs as possible. You can reduce the number of germs on your skin by washing with CHG (chlorhexidine gluconate) soap before surgery. CHG is an antiseptic soap that kills germs and continues to kill germs even after washing.   DO NOT use if you have an allergy to chlorhexidine/CHG or antibacterial soaps. If your skin becomes reddened or irritated, stop using the CHG and notify one of our RNs at [phone].   Please shower with the CHG soap starting 4 days before surgery using the following schedule: 07/31 - 08/05    Please keep in mind the following:  DO NOT shave, including legs and underarms, starting the day of your first shower.   You may shave your face at any point before/day of surgery.  Place clean sheets on your bed the day you start using CHG soap. Use a clean washcloth (not used since being washed) for each shower. DO NOT sleep with pets once you start  using the CHG.   CHG Shower Instructions:  If you choose to wash your hair and private area, wash first with your normal shampoo/soap.  After you use shampoo/soap, rinse your hair and body thoroughly to remove shampoo/soap residue.  Turn the water OFF and apply about 3 tablespoons (45 ml) of CHG soap to a CLEAN washcloth.  Apply CHG soap ONLY FROM YOUR NECK DOWN TO YOUR TOES (washing for 3-5 minutes)  DO NOT use CHG soap on face, private areas, open wounds, or sores.  Pay special attention to the area where your surgery is being performed.  If you are having back surgery, having someone wash your back for you may be helpful. Wait 2 minutes after CHG soap is applied, then you may rinse off the CHG soap.  Pat dry with a clean towel  Put on clean clothes/pajamas   If you choose to wear lotion, please use ONLY the CHG-compatible lotions on the back of this paper.     Additional instructions for the day of  surgery: DO NOT APPLY any lotions, deodorants, cologne, or perfumes.   Put on clean/comfortable clothes.  Brush your teeth.  Ask your nurse before applying any prescription medications to the skin.      CHG Compatible Lotions   Aveeno Moisturizing lotion  Cetaphil Moisturizing Cream  Cetaphil Moisturizing Lotion  Clairol Herbal Essence Moisturizing Lotion, Dry Skin  Clairol Herbal Essence Moisturizing Lotion, Extra Dry Skin  Clairol Herbal Essence Moisturizing Lotion, Normal Skin  Curel Age Defying Therapeutic Moisturizing Lotion with Alpha Hydroxy  Curel Extreme Care Body Lotion  Curel Soothing Hands Moisturizing Hand Lotion  Curel Therapeutic Moisturizing Cream, Fragrance-Free  Curel Therapeutic Moisturizing Lotion, Fragrance-Free  Curel Therapeutic Moisturizing Lotion, Original Formula  Eucerin Daily Replenishing Lotion  Eucerin Dry Skin Therapy Plus Alpha Hydroxy Crme  Eucerin Dry Skin Therapy Plus Alpha Hydroxy Lotion  Eucerin Original Crme  Eucerin Original Lotion   Eucerin Plus Crme Eucerin Plus Lotion  Eucerin TriLipid Replenishing Lotion  Keri Anti-Bacterial Hand Lotion  Keri Deep Conditioning Original Lotion Dry Skin Formula Softly Scented  Keri Deep Conditioning Original Lotion, Fragrance Free Sensitive Skin Formula  Keri Lotion Fast Absorbing Fragrance Free Sensitive Skin Formula  Keri Lotion Fast Absorbing Softly Scented Dry Skin Formula  Keri Original Lotion  Keri Skin Renewal Lotion Keri Silky Smooth Lotion  Keri Silky Smooth Sensitive Skin Lotion  Nivea Body Creamy Conditioning Oil  Nivea Body Extra Enriched Teacher, adult education Moisturizing Lotion Nivea Crme  Nivea Skin Firming Lotion  NutraDerm 30 Skin Lotion  NutraDerm Skin Lotion  NutraDerm Therapeutic Skin Cream  NutraDerm Therapeutic Skin Lotion  ProShield Protective Hand Cream  Provon moisturizing lotion

## 2023-06-01 NOTE — Progress Notes (Signed)
  Perioperative Services Pre-Admission/Anesthesia Testing    Date: 06/01/23  Name: [name] MRN:   [id number]  Re: GLP-1 clearance and provider recommendations   Planned Surgical Procedure(s):    Case: [id number] Date/Time: 06/05/23 0900   Procedure: RIGHT MINIMALLY INVASIVE (MIS) C8-T1 DISCECTOMY (Right)   Anesthesia type: General   Pre-op diagnosis:      Cervical radiculopathy at C8  M54.12      Herniated cervical intervertebral disc M50.20      Right hand weakness R29.898   Location: [facility] OR ROOM 03 / [facility]   Surgeons: Loreen Freud, MD      Clinical Notes:  Patient is scheduled for the above procedure with the indicated provider/surgeon. In review of his medication reconciliation it was noted that patient is on a prescribed GLP-1 medication. Per guidelines issued by the American Society of Anesthesiologists (ASA), it is recommended that these medications be held for 7 days prior to the patient undergoing any type of elective surgical procedure. The patient is taking the following GLP-1 medication:  [x]  SEMAGLUTIDE   []  EXENATIDE  []  LIRAGLUTIDE   []  LIXISENATIDE  []  DULAGLUTIDE     []  TIRZEPATIDE (GLP-1/GIP)  Reached out to prescribing provider Huntley Dec, PA-C) to make them aware of the guidelines from anesthesia. Given that this patient takes the prescribed GLP-1 medication for his  diabetes diagnosis, rather than for weight loss, recommendations from the prescribing provider were solicited. Prescribing provider made aware of the following so that informed decision/POC can be developed for this patient that may be taking medications belonging to these drug classes:  Oral GLP-1 medications will be held 1 day prior to surgery.  Injectable GLP-1 medications will be held 7 days prior to surgery.  Metformin is routinely held 48 hours prior to surgery due to renal concerns, potential need for contrasted imaging perioperatively, and the  potential for tissue hypoxia leading to drug induced lactic acidosis.  All SGLT2i medications are held 72 hours prior to surgery as they can be associated with the increased potential for developing euglycemic diabetic ketoacidosis (EDKA).   Impression and Plan:  [name] is on a prescribed GLP-1 medication, which induces the known side effect of decreased gastric emptying. Efforts are bring made to mitigate the risk of perioperative hyperglycemic events, as elevated blood glucose levels have been found to contribute to intra/postoperative complications. Additionally, hyperglycemic extremes can potentially necessitate the postponing of a patient's elective case in order to better optimize perioperative glycemic control, again with the aforementioned guidelines in place. With this in mind, recommendations have been sought from the prescribing provider, who has cleared patient to proceed with holding the prescribed GLP-1 as per the guidelines from the ASA.   Provider recommending: no further recommendations received from the prescribing provider.  Copy of signed clearance and recommendations placed on patient's chart for inclusion in their medical record and for review by the surgical/anesthetic team on the day of his procedure.   Quentin Mulling, MSN, APRN, FNP-C, CEN [facility]  Peri-operative Services Nurse Practitioner Phone: [phone] 06/01/23 9:14 AM  NOTE: This note has been prepared using Dragon dictation software. Despite my best ability to proofread, there is always the potential that unintentional transcriptional errors may still occur from this process.

## 2023-06-04 MED ORDER — SODIUM CHLORIDE 0.9 % IV SOLN: INTRAVENOUS | Status: DC

## 2023-06-04 MED ORDER — CEFAZOLIN IN SODIUM CHLORIDE 2-0.9 GM/100ML-% IV SOLN
2.0000 g | Freq: Once | INTRAVENOUS | Status: DC
  Filled 2023-06-04: qty 100

## 2023-06-04 MED ORDER — FAMOTIDINE 20 MG PO TABS
20.0000 mg | ORAL_TABLET | Freq: Once | ORAL | Status: AC
  Administered 2023-06-05: 20 mg via ORAL

## 2023-06-04 MED ORDER — CHLORHEXIDINE GLUCONATE 0.12 % MT SOLN
15.0000 mL | Freq: Once | OROMUCOSAL | Status: AC
  Administered 2023-06-05: 15 mL via OROMUCOSAL

## 2023-06-04 MED ORDER — ORAL CARE MOUTH RINSE: 15.0000 mL | Freq: Once | OROMUCOSAL | Status: AC

## 2023-06-04 MED ORDER — CEFAZOLIN SODIUM-DEXTROSE 2-4 GM/100ML-% IV SOLN
2.0000 g | INTRAVENOUS | Status: AC
  Administered 2023-06-05: 2 g via INTRAVENOUS

## 2023-06-05 ENCOUNTER — Encounter
Admission: RE | Disposition: A | Discharge status: Home / Self Care | Payer: Self-pay | Source: Ambulatory Visit | Attending: Neurosurgery

## 2023-06-05 ENCOUNTER — Ambulatory Visit: Payer: Medicare Other

## 2023-06-05 ENCOUNTER — Ambulatory Visit: Payer: Medicare Other | Admitting: Urgent Care

## 2023-06-05 ENCOUNTER — Observation Stay
Admission: RE | Admit: 2023-06-05 | Discharge: 2023-06-06 | Disposition: A | Discharge status: Home / Self Care | Payer: Medicare Other | Source: Ambulatory Visit | Attending: Neurosurgery | Admitting: Neurosurgery

## 2023-06-05 ENCOUNTER — Other Ambulatory Visit: Payer: Self-pay

## 2023-06-05 ENCOUNTER — Encounter: Payer: Self-pay | Admitting: Neurosurgery

## 2023-06-05 DIAGNOSIS — M5023 Other cervical disc displacement, cervicothoracic region: Secondary | ICD-10-CM | POA: Diagnosis present

## 2023-06-05 DIAGNOSIS — Z87891 Personal history of nicotine dependence: Secondary | ICD-10-CM | POA: Diagnosis not present

## 2023-06-05 DIAGNOSIS — R29898 Other symptoms and signs involving the musculoskeletal system: Secondary | ICD-10-CM

## 2023-06-05 DIAGNOSIS — M5412 Radiculopathy, cervical region: Secondary | ICD-10-CM | POA: Diagnosis not present

## 2023-06-05 DIAGNOSIS — Z79899 Other long term (current) drug therapy: Secondary | ICD-10-CM | POA: Insufficient documentation

## 2023-06-05 DIAGNOSIS — Z7984 Long term (current) use of oral hypoglycemic drugs: Secondary | ICD-10-CM | POA: Diagnosis not present

## 2023-06-05 DIAGNOSIS — E119 Type 2 diabetes mellitus without complications: Secondary | ICD-10-CM | POA: Insufficient documentation

## 2023-06-05 DIAGNOSIS — Z01818 Encounter for other preprocedural examination: Secondary | ICD-10-CM

## 2023-06-05 DIAGNOSIS — Z01812 Encounter for preprocedural laboratory examination: Secondary | ICD-10-CM

## 2023-06-05 HISTORY — PX: POSTERIOR CERVICAL LAMINECTOMY: SHX2248

## 2023-06-05 LAB — GLUCOSE, CAPILLARY
Glucose-Capillary: 165 mg/dL — ABNORMAL HIGH (ref 70–99)
Glucose-Capillary: 218 mg/dL — ABNORMAL HIGH (ref 70–99)
Glucose-Capillary: 383 mg/dL — ABNORMAL HIGH (ref 70–99)
Glucose-Capillary: 367 mg/dL — ABNORMAL HIGH (ref 70–99)

## 2023-06-05 SURGERY — POSTERIOR CERVICAL LAMINECTOMY
Anesthesia: General | Laterality: Right

## 2023-06-05 MED ORDER — FENTANYL CITRATE (PF) 100 MCG/2ML IJ SOLN
INTRAMUSCULAR | Status: AC
  Filled 2023-06-05: qty 2

## 2023-06-05 MED ORDER — MENTHOL 3 MG MT LOZG: 1.0000 | LOZENGE | OROMUCOSAL | Status: DC | PRN

## 2023-06-05 MED ORDER — OXYCODONE HCL 5 MG PO TABS: 5.0000 mg | ORAL_TABLET | ORAL | 0 refills | Status: DC | PRN

## 2023-06-05 MED ORDER — PHENYLEPHRINE HCL-NACL 20-0.9 MG/250ML-% IV SOLN
INTRAVENOUS | Status: AC
  Filled 2023-06-05: qty 250

## 2023-06-05 MED ORDER — CEFAZOLIN SODIUM-DEXTROSE 2-4 GM/100ML-% IV SOLN: 2.0000 g | Freq: Three times a day (TID) | INTRAVENOUS | Status: DC

## 2023-06-05 MED ORDER — DEXAMETHASONE SODIUM PHOSPHATE 10 MG/ML IJ SOLN
INTRAMUSCULAR | Status: DC | PRN
  Administered 2023-06-05: 8 mg via INTRAVENOUS

## 2023-06-05 MED ORDER — ONDANSETRON HCL 4 MG/2ML IJ SOLN
INTRAMUSCULAR | Status: DC | PRN
Start: 2023-06-05 — End: 2023-06-05
  Administered 2023-06-05: 4 mg via INTRAVENOUS

## 2023-06-05 MED ORDER — ACETAMINOPHEN 10 MG/ML IV SOLN: 1000.0000 mg | Freq: Once | INTRAVENOUS | Status: DC | PRN

## 2023-06-05 MED ORDER — BUPIVACAINE HCL 0.5 % IJ SOLN
INTRAMUSCULAR | Status: DC | PRN
  Administered 2023-06-05: 10 mL

## 2023-06-05 MED ORDER — 0.9 % SODIUM CHLORIDE (POUR BTL) OPTIME
TOPICAL | Status: DC | PRN
  Administered 2023-06-05: 500 mL

## 2023-06-05 MED ORDER — SUGAMMADEX SODIUM 200 MG/2ML IV SOLN
INTRAVENOUS | Status: DC | PRN
  Administered 2023-06-05: 200 mg via INTRAVENOUS

## 2023-06-05 MED ORDER — DEXAMETHASONE SODIUM PHOSPHATE 10 MG/ML IJ SOLN
INTRAMUSCULAR | Status: AC
  Filled 2023-06-05: qty 1

## 2023-06-05 MED ORDER — CHLORHEXIDINE GLUCONATE 0.12 % MT SOLN
OROMUCOSAL | Status: AC
  Filled 2023-06-05: qty 15

## 2023-06-05 MED ORDER — METHOCARBAMOL 500 MG PO TABS: 500.0000 mg | ORAL_TABLET | Freq: Four times a day (QID) | ORAL | 0 refills | Status: AC | PRN

## 2023-06-05 MED ORDER — ENOXAPARIN SODIUM 40 MG/0.4ML IJ SOSY
40.0000 mg | PREFILLED_SYRINGE | INTRAMUSCULAR | Status: DC
  Administered 2023-06-06: 40 mg via SUBCUTANEOUS
  Filled 2023-06-05: qty 0.4

## 2023-06-05 MED ORDER — ROPINIROLE HCL 1 MG PO TABS
0.5000 mg | ORAL_TABLET | Freq: Every day | ORAL | Status: DC
  Administered 2023-06-05: 0.5 mg via ORAL
  Filled 2023-06-05: qty 1

## 2023-06-05 MED ORDER — BUPIVACAINE LIPOSOME 1.3 % IJ SUSP
INTRAMUSCULAR | Status: AC
  Filled 2023-06-05: qty 20

## 2023-06-05 MED ORDER — ACETAMINOPHEN 325 MG PO TABS: 650.0000 mg | ORAL_TABLET | ORAL | Status: DC | PRN

## 2023-06-05 MED ORDER — ONDANSETRON HCL 4 MG/2ML IJ SOLN
INTRAMUSCULAR | Status: AC
  Filled 2023-06-05: qty 2

## 2023-06-05 MED ORDER — OXYCODONE HCL 5 MG PO TABS
10.0000 mg | ORAL_TABLET | ORAL | Status: DC | PRN
  Administered 2023-06-05: 10 mg via ORAL
  Filled 2023-06-05: qty 2

## 2023-06-05 MED ORDER — BACITRACIN ZINC 500 UNIT/GM EX OINT
TOPICAL_OINTMENT | CUTANEOUS | Status: AC
  Filled 2023-06-05: qty 28.35

## 2023-06-05 MED ORDER — ATORVASTATIN CALCIUM 20 MG PO TABS
80.0000 mg | ORAL_TABLET | Freq: Every evening | ORAL | Status: DC
  Administered 2023-06-05: 80 mg via ORAL
  Filled 2023-06-05: qty 4

## 2023-06-05 MED ORDER — ONDANSETRON HCL 4 MG/2ML IJ SOLN: 4.0000 mg | Freq: Four times a day (QID) | INTRAMUSCULAR | Status: DC | PRN

## 2023-06-05 MED ORDER — PHENYLEPHRINE HCL-NACL 20-0.9 MG/250ML-% IV SOLN
INTRAVENOUS | Status: DC | PRN
  Administered 2023-06-05: 40 ug/min via INTRAVENOUS

## 2023-06-05 MED ORDER — FENTANYL CITRATE (PF) 100 MCG/2ML IJ SOLN: 25.0000 ug | INTRAMUSCULAR | Status: DC | PRN

## 2023-06-05 MED ORDER — BUPIVACAINE HCL (PF) 0.5 % IJ SOLN
INTRAMUSCULAR | Status: AC
  Filled 2023-06-05: qty 60

## 2023-06-05 MED ORDER — SODIUM CHLORIDE 0.9% FLUSH: 3.0000 mL | INTRAVENOUS | Status: DC | PRN

## 2023-06-05 MED ORDER — PHENYLEPHRINE HCL (PRESSORS) 10 MG/ML IV SOLN
INTRAVENOUS | Status: DC | PRN
  Administered 2023-06-05: 80 ug via INTRAVENOUS

## 2023-06-05 MED ORDER — PROPOFOL 10 MG/ML IV BOLUS
INTRAVENOUS | Status: AC
  Filled 2023-06-05: qty 20

## 2023-06-05 MED ORDER — GABAPENTIN 400 MG PO CAPS
400.0000 mg | ORAL_CAPSULE | Freq: Three times a day (TID) | ORAL | Status: DC
  Administered 2023-06-05 – 2023-06-06 (×3): 400 mg via ORAL
  Filled 2023-06-05 (×3): qty 1

## 2023-06-05 MED ORDER — OXYCODONE HCL 5 MG/5ML PO SOLN: 5.0000 mg | Freq: Once | ORAL | Status: DC | PRN

## 2023-06-05 MED ORDER — POLYETHYLENE GLYCOL 3350 17 G PO PACK: 17.0000 g | PACK | Freq: Every day | ORAL | Status: DC | PRN

## 2023-06-05 MED ORDER — VITAMIN B-12 100 MCG PO TABS
100.0000 ug | ORAL_TABLET | Freq: Every day | ORAL | Status: DC
  Administered 2023-06-06: 100 ug via ORAL
  Filled 2023-06-05: qty 1

## 2023-06-05 MED ORDER — METHOCARBAMOL 500 MG PO TABS: 500.0000 mg | ORAL_TABLET | Freq: Four times a day (QID) | ORAL | Status: DC | PRN

## 2023-06-05 MED ORDER — ACETAMINOPHEN 10 MG/ML IV SOLN
INTRAVENOUS | Status: DC | PRN
  Administered 2023-06-05: 1000 mg via INTRAVENOUS

## 2023-06-05 MED ORDER — LACTATED RINGERS IV SOLN: INTRAVENOUS | Status: DC

## 2023-06-05 MED ORDER — ROCURONIUM BROMIDE 10 MG/ML (PF) SYRINGE
PREFILLED_SYRINGE | INTRAVENOUS | Status: AC
  Filled 2023-06-05: qty 10

## 2023-06-05 MED ORDER — ONDANSETRON HCL 4 MG/2ML IJ SOLN: 4.0000 mg | Freq: Once | INTRAMUSCULAR | Status: DC | PRN

## 2023-06-05 MED ORDER — MIDAZOLAM HCL 2 MG/2ML IJ SOLN
INTRAMUSCULAR | Status: AC
  Filled 2023-06-05: qty 2

## 2023-06-05 MED ORDER — GLYCOPYRROLATE 0.2 MG/ML IJ SOLN
INTRAMUSCULAR | Status: DC | PRN
  Administered 2023-06-05: .1 mg via INTRAVENOUS

## 2023-06-05 MED ORDER — EPINEPHRINE PF 1 MG/ML IJ SOLN
INTRAMUSCULAR | Status: AC
  Filled 2023-06-05: qty 1

## 2023-06-05 MED ORDER — ONDANSETRON HCL 4 MG PO TABS: 4.0000 mg | ORAL_TABLET | Freq: Four times a day (QID) | ORAL | Status: DC | PRN

## 2023-06-05 MED ORDER — QUETIAPINE FUMARATE 25 MG PO TABS
25.0000 mg | ORAL_TABLET | Freq: Every day | ORAL | Status: DC
  Administered 2023-06-05: 25 mg via ORAL
  Filled 2023-06-05: qty 1

## 2023-06-05 MED ORDER — SODIUM CHLORIDE 0.9 % IV SOLN: INTRAVENOUS | Status: DC

## 2023-06-05 MED ORDER — PROPOFOL 10 MG/ML IV BOLUS
INTRAVENOUS | Status: DC | PRN
Start: 2023-06-05 — End: 2023-06-05
  Administered 2023-06-05: 120 mg via INTRAVENOUS

## 2023-06-05 MED ORDER — INSULIN ASPART 100 UNIT/ML IJ SOLN
0.0000 [IU] | Freq: Three times a day (TID) | INTRAMUSCULAR | Status: DC
  Administered 2023-06-05: 15 [IU] via SUBCUTANEOUS
  Administered 2023-06-06: 8 [IU] via SUBCUTANEOUS
  Filled 2023-06-05 (×2): qty 1

## 2023-06-05 MED ORDER — ACETAMINOPHEN 10 MG/ML IV SOLN
INTRAVENOUS | Status: AC
  Filled 2023-06-05: qty 100

## 2023-06-05 MED ORDER — PHENYLEPHRINE 80 MCG/ML (10ML) SYRINGE FOR IV PUSH (FOR BLOOD PRESSURE SUPPORT)
PREFILLED_SYRINGE | INTRAVENOUS | Status: AC
  Filled 2023-06-05: qty 10

## 2023-06-05 MED ORDER — MIDAZOLAM HCL 2 MG/2ML IJ SOLN
INTRAMUSCULAR | Status: DC | PRN
  Administered 2023-06-05: 2 mg via INTRAVENOUS

## 2023-06-05 MED ORDER — SODIUM CHLORIDE FLUSH 0.9 % IV SOLN
INTRAVENOUS | Status: AC
  Filled 2023-06-05: qty 20

## 2023-06-05 MED ORDER — LIDOCAINE HCL (PF) 2 % IJ SOLN
INTRAMUSCULAR | Status: AC
  Filled 2023-06-05: qty 5

## 2023-06-05 MED ORDER — LIDOCAINE HCL (CARDIAC) PF 100 MG/5ML IV SOSY
PREFILLED_SYRINGE | INTRAVENOUS | Status: DC | PRN
  Administered 2023-06-05: 100 mg via INTRAVENOUS

## 2023-06-05 MED ORDER — FAMOTIDINE 20 MG PO TABS
ORAL_TABLET | ORAL | Status: AC
  Filled 2023-06-05: qty 1

## 2023-06-05 MED ORDER — CEFAZOLIN SODIUM-DEXTROSE 2-4 GM/100ML-% IV SOLN
INTRAVENOUS | Status: AC
  Filled 2023-06-05: qty 100

## 2023-06-05 MED ORDER — KETOROLAC TROMETHAMINE 30 MG/ML IJ SOLN
INTRAMUSCULAR | Status: AC
  Filled 2023-06-05: qty 1

## 2023-06-05 MED ORDER — OXYCODONE HCL 5 MG PO TABS: 5.0000 mg | ORAL_TABLET | ORAL | Status: DC | PRN

## 2023-06-05 MED ORDER — INSULIN ASPART 100 UNIT/ML IJ SOLN
0.0000 [IU] | Freq: Every day | INTRAMUSCULAR | Status: DC
  Administered 2023-06-05: 5 [IU] via SUBCUTANEOUS
  Filled 2023-06-05: qty 1

## 2023-06-05 MED ORDER — DOCUSATE SODIUM 100 MG PO CAPS
100.0000 mg | ORAL_CAPSULE | Freq: Two times a day (BID) | ORAL | Status: DC
  Administered 2023-06-05: 100 mg via ORAL
  Filled 2023-06-05 (×2): qty 1

## 2023-06-05 MED ORDER — ALLOPURINOL 300 MG PO TABS
300.0000 mg | ORAL_TABLET | Freq: Every day | ORAL | Status: DC
  Administered 2023-06-05 – 2023-06-06 (×2): 300 mg via ORAL
  Filled 2023-06-05 (×2): qty 1

## 2023-06-05 MED ORDER — SODIUM CHLORIDE 0.9 % IV SOLN: 250.0000 mL | INTRAVENOUS | Status: DC

## 2023-06-05 MED ORDER — PRAVASTATIN SODIUM 40 MG PO TABS: 80.0000 mg | ORAL_TABLET | Freq: Every day | ORAL | Status: DC

## 2023-06-05 MED ORDER — BUPROPION HCL ER (XL) 150 MG PO TB24
150.0000 mg | ORAL_TABLET | Freq: Every day | ORAL | Status: DC
  Administered 2023-06-05 – 2023-06-06 (×2): 150 mg via ORAL
  Filled 2023-06-05 (×2): qty 1

## 2023-06-05 MED ORDER — OXYCODONE HCL 5 MG PO TABS: 5.0000 mg | ORAL_TABLET | Freq: Once | ORAL | Status: DC | PRN

## 2023-06-05 MED ORDER — ROCURONIUM BROMIDE 100 MG/10ML IV SOLN
INTRAVENOUS | Status: DC | PRN
  Administered 2023-06-05: 60 mg via INTRAVENOUS
  Administered 2023-06-05: 10 mg via INTRAVENOUS

## 2023-06-05 MED ORDER — BISACODYL 10 MG RE SUPP: 10.0000 mg | Freq: Every day | RECTAL | Status: DC | PRN

## 2023-06-05 MED ORDER — BUPIVACAINE-EPINEPHRINE (PF) 0.5% -1:200000 IJ SOLN
INTRAMUSCULAR | Status: DC | PRN
  Administered 2023-06-05: 6 mL via PERINEURAL

## 2023-06-05 MED ORDER — KETAMINE HCL 10 MG/ML IJ SOLN
INTRAMUSCULAR | Status: DC | PRN
  Administered 2023-06-05: 30 mg via INTRAVENOUS
  Administered 2023-06-05: 20 mg via INTRAVENOUS

## 2023-06-05 MED ORDER — SENNA 8.6 MG PO TABS
1.0000 | ORAL_TABLET | Freq: Two times a day (BID) | ORAL | Status: DC
  Administered 2023-06-05: 8.6 mg via ORAL
  Filled 2023-06-05 (×2): qty 1

## 2023-06-05 MED ORDER — SODIUM CHLORIDE 0.9% FLUSH
3.0000 mL | Freq: Two times a day (BID) | INTRAVENOUS | Status: DC
  Administered 2023-06-05 (×2): 3 mL via INTRAVENOUS

## 2023-06-05 MED ORDER — FENTANYL CITRATE (PF) 100 MCG/2ML IJ SOLN
INTRAMUSCULAR | Status: DC | PRN
  Administered 2023-06-05 (×2): 50 ug via INTRAVENOUS

## 2023-06-05 MED ORDER — METHOCARBAMOL 1000 MG/10ML IJ SOLN: 500.0000 mg | Freq: Four times a day (QID) | INTRAVENOUS | Status: DC | PRN

## 2023-06-05 MED ORDER — ACETAMINOPHEN 650 MG RE SUPP: 650.0000 mg | RECTAL | Status: DC | PRN

## 2023-06-05 MED ORDER — KETAMINE HCL 50 MG/5ML IJ SOSY
PREFILLED_SYRINGE | INTRAMUSCULAR | Status: AC
  Filled 2023-06-05: qty 5

## 2023-06-05 MED ORDER — SODIUM CHLORIDE 0.9 % IV SOLN: INTRAVENOUS | Status: DC | PRN

## 2023-06-05 MED ORDER — GLYCOPYRROLATE 0.2 MG/ML IJ SOLN
INTRAMUSCULAR | Status: AC
  Filled 2023-06-05: qty 1

## 2023-06-05 MED ORDER — LISINOPRIL 5 MG PO TABS
5.0000 mg | ORAL_TABLET | Freq: Every day | ORAL | Status: DC
  Administered 2023-06-05 – 2023-06-06 (×2): 5 mg via ORAL
  Filled 2023-06-05 (×2): qty 1

## 2023-06-05 MED ORDER — PHENOL 1.4 % MT LIQD: 1.0000 | OROMUCOSAL | Status: DC | PRN

## 2023-06-05 SURGICAL SUPPLY — 40 items
ADH SKN CLS APL DERMABOND .7 (GAUZE/BANDAGES/DRESSINGS) ×1
AGENT HMST KT MTR STRL THRMB (HEMOSTASIS) ×1
BASIN KIT SINGLE STR (MISCELLANEOUS) ×1 IMPLANT
BUR NEURO DRILL SOFT 3.0X3.8M (BURR) ×1 IMPLANT
DERMABOND ADVANCED .7 DNX12 (GAUZE/BANDAGES/DRESSINGS) ×1 IMPLANT
DRAPE C ARM PK CFD 31 SPINE (DRAPES) ×1 IMPLANT
DRAPE LAPAROTOMY 100X77 ABD (DRAPES) ×1 IMPLANT
DRAPE MICROSCOPE SPINE 48X150 (DRAPES) IMPLANT
ELECT REM PT RETURN 9FT ADLT (ELECTROSURGICAL) ×1
ELECTRODE REM PT RTRN 9FT ADLT (ELECTROSURGICAL) ×1 IMPLANT
GLOVE BIOGEL PI IND STRL 6.5 (GLOVE) ×1 IMPLANT
GLOVE SURG SYN 6.5 ES PF (GLOVE) ×2 IMPLANT
GLOVE SURG SYN 6.5 PF PI (GLOVE) ×2 IMPLANT
GLOVE SURG SYN 8.5 E (GLOVE) ×3 IMPLANT
GLOVE SURG SYN 8.5 PF PI (GLOVE) ×3 IMPLANT
GOWN SRG LRG LVL 4 IMPRV REINF (GOWNS) ×1 IMPLANT
GOWN SRG XL LVL 3 NONREINFORCE (GOWNS) ×1 IMPLANT
GOWN STRL NON-REIN TWL XL LVL3 (GOWNS) ×1
GOWN STRL REIN LRG LVL4 (GOWNS) ×1
HOLDER FOLEY CATH W/STRAP (MISCELLANEOUS) ×1 IMPLANT
KIT PREVENA INCISION MGT 13 (CANNISTER) ×1 IMPLANT
KIT SPINAL PRONEVIEW (KITS) ×1 IMPLANT
MANIFOLD NEPTUNE II (INSTRUMENTS) ×1 IMPLANT
MARKER SKIN DUAL TIP RULER LAB (MISCELLANEOUS) ×1 IMPLANT
NDL SAFETY ECLIP 18X1.5 (MISCELLANEOUS) ×1 IMPLANT
NS IRRIG 1000ML POUR BTL (IV SOLUTION) ×1 IMPLANT
PACK LAMINECTOMY ARMC (PACKS) ×1 IMPLANT
PAD ARMBOARD 7.5X6 YLW CONV (MISCELLANEOUS) ×1 IMPLANT
PIN MAYFIELD SKULL DISP (PIN) IMPLANT
SOLUTION IRRIG SURGIPHOR (IV SOLUTION) ×1 IMPLANT
STAPLER SKIN PROX 35W (STAPLE) IMPLANT
SURGIFLO W/THROMBIN 8M KIT (HEMOSTASIS) ×1 IMPLANT
SUT DVC VLOC 3-0 CL 6 P-12 (SUTURE) ×1 IMPLANT
SUT VIC AB 0 CT1 27 (SUTURE) ×2
SUT VIC AB 0 CT1 27XCR 8 STRN (SUTURE) ×2 IMPLANT
SUT VIC AB 2-0 CT1 18 (SUTURE) ×2 IMPLANT
SYR 10ML LL (SYRINGE) ×1 IMPLANT
SYR 30ML LL (SYRINGE) ×2 IMPLANT
TRAP FLUID SMOKE EVACUATOR (MISCELLANEOUS) ×1 IMPLANT
WATER STERILE IRR 1000ML POUR (IV SOLUTION) ×2 IMPLANT

## 2023-06-05 NOTE — Interval H&P Note (Addendum)
History and Physical Interval Note:  06/05/2023 9:07 AM  [name]  has presented today for surgery, with the diagnosis of Cervical radiculopathy at C8  M54.12  Herniated cervical intervertebral disc M50.20  Right hand weakness R29.898.  The various methods of treatment have been discussed with the patient and family. After consideration of risks, benefits and other options for treatment, the patient has consented to  Procedure(s): RIGHT MINIMALLY INVASIVE (MIS) C7-T1 DISCECTOMY (Right) as a surgical intervention.  The patient's history has been reviewed, patient examined, no change in status, stable for surgery.  I have reviewed the patient's chart and labs.  Questions were answered to the patient's satisfaction.     Loreen Freud

## 2023-06-05 NOTE — Anesthesia Preprocedure Evaluation (Addendum)
Anesthesia Evaluation  Patient identified by MRN, date of birth, ID band Patient awake    Reviewed: Allergy & Precautions, NPO status , Patient's Chart, lab work & pertinent test results  History of Anesthesia Complications Negative for: history of anesthetic complications  Airway Mallampati: IV   Neck ROM: Full    Dental  (+) Missing   Pulmonary former smoker (quit 1998)   Pulmonary exam normal breath sounds clear to auscultation       Cardiovascular +CHF (EF 50-55%)  Normal cardiovascular exam Rhythm:Regular Rate:Normal  ECG 05/31/23:  Normal sinus rhythm Minimal voltage criteria for LVH, may be normal variant ( R in aVL ) Nonspecific T wave abnormality  Echo 12/21/22:  Normal Stress Echocardiogram  NORMAL RIGHT VENTRICULAR SYSTOLIC FUNCTION  TRIVIAL REGURGITATION NOTED  NO VALVULAR STENOSIS NOTED    Neuro/Psych  PSYCHIATRIC DISORDERS  Depression    CVA (2022; residual fingertip numbness; uses cane or walker for ambulation)    GI/Hepatic s/p gastric band   Endo/Other  diabetes, Type 2    Renal/GU Renal disease (stage III CKD)     Musculoskeletal Gout    Abdominal   Peds  Hematology negative hematology ROS (+)   Anesthesia Other Findings Cardiology note 12/20/22:  67 y.o. male with  1. Preoperative cardiovascular examination  2. Need for vaccination  3. Cerebrovascular accident (CVA), unspecified mechanism (CMS-HCC)  4. 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 Abnormal ECG  6. History of tobacco use  7. New electrocardiogram abnormalities consistent with ischemia noted during preoperative cardiovascular examination   67 year old referred for preoperative cardiovascular evaluation prior to dental procedure. He has cardiovascular risk factors including male gender, age, type II diabetes, history of CVA, previous tobacco and alcohol use, and hyperlipidemia. ECG is abnormal, similar  to previous. He denies chest pain, but does have exertional dyspnea.  Plan   Continue current medications Recommend Mediterranean Diet Continue high intensity atorvastatin Stress echocardiogram Return to clinic after stress echo to discuss results and further preoperative risk stratifications    Reproductive/Obstetrics                             Anesthesia Physical Anesthesia Plan  ASA: 3  Anesthesia Plan: General   Post-op Pain Management:    Induction: Intravenous  PONV Risk Score and Plan: 2 and Ondansetron, Dexamethasone and Treatment may vary due to age or medical condition  Airway Management Planned: Oral ETT  Additional Equipment:   Intra-op Plan:   Post-operative Plan: Extubation in OR  Informed Consent: I have reviewed the patients History and Physical, chart, labs and discussed the procedure including the risks, benefits and alternatives for the proposed anesthesia with the patient or authorized representative who has indicated his/her understanding and acceptance.   Patient has DNR.  Discussed DNR with patient and Continue DNR.   Dental advisory given  Plan Discussed with: CRNA  Anesthesia Plan Comments: (Patient consented for risks of anesthesia including but not limited to:  - adverse reactions to medications - damage to eyes, teeth, lips or other oral mucosa - nerve damage due to positioning  - sore throat or hoarseness - damage to heart, brain, nerves, lungs, other parts of body or loss of life  Informed patient about role of CRNA in peri- and intra-operative care.  Patient voiced understanding.)        Anesthesia Quick Evaluation

## 2023-06-05 NOTE — Discharge Summary (Incomplete Revision)
Discharge Summary  Patient ID: [name] MRN: [id number] DOB/AGE: [birth year] 67 y.o.  Admit date: 06/05/2023 Discharge date: 06/05/2023  Admission Diagnoses: Cervical radiculopathy Discharge Diagnoses:  Active Problems:   Radiculopathy, cervical   Discharged Condition: good  Hospital Course:  [name] is a 67 year old presenting with right-sided cervical radiculopathy status post right C7-T1 microdiscectomy.  His intraoperative course was uncomplicated.  He was monitored in PACU and discharged home after ambulating, urinating, and tolerating p.o. intake.  On examination in PACU, he was noted to have increased hand strength on the right.  He was given prescriptions for oxycodone and Robaxin.  Consults: None  Significant Diagnostic Studies: none  Treatments: surgery: as above. Please see separately dictated operative report for further details.  Discharge Exam: Blood pressure (!) 143/74, pulse 74, temperature (!) 96.9 F (36.1 C), resp. rate 12, height 6\' 2"  (1.88 m), weight 106.1 kg, SpO2 100%. CN grossly intact 5/5 throughout except 4/5 right IO and HG Incision c/d/I with post-op dressing in place  Disposition: Discharge disposition: 01-Home or Self Care        Allergies as of 06/05/2023   No Known Allergies      Medication List     STOP taking these medications    HYDROcodone-acetaminophen 5-325 MG tablet Commonly known as: NORCO/VICODIN       TAKE these medications    allopurinol 300 MG tablet Commonly known as: ZYLOPRIM Take 300 mg by mouth daily.   atorvastatin 80 MG tablet Commonly known as: Lipitor Take 1 tablet (80 mg total) by mouth daily.   B-D UF III MINI PEN NEEDLES 31G X 5 MM Misc Generic drug: Insulin Pen Needle Inject 1 each into the skin 3 (three) times daily.   BD Disp Needles 18G X 1-1/2" Misc Generic drug: NEEDLE (DISP) 18 G   BD PrecisionGlide Needle 23G X 1-1/2" Misc Generic drug: NEEDLE (DISP) 23 G   buPROPion 150 MG  24 hr tablet Commonly known as: WELLBUTRIN XL Take 150 mg by mouth daily.   cyanocobalamin 100 MCG tablet Take 100 mcg by mouth daily.   empagliflozin 25 MG Tabs tablet Commonly known as: JARDIANCE Take 25 mg by mouth daily.   FreeStyle Libre 14 Day Reader Hardie Pulley Use 1 Device as directed   gabapentin 400 MG capsule Commonly known as: NEURONTIN Take 400 mg by mouth 3 (three) times daily.   Lantus SoloStar 100 UNIT/ML Solostar Pen Generic drug: insulin glargine Inject 6 Units into the skin at bedtime.   lisinopril 5 MG tablet Commonly known as: ZESTRIL Take 5 mg by mouth daily.   metFORMIN 500 MG tablet Commonly known as: GLUCOPHAGE Take 1,000 mg by mouth 2 (two) times daily with a meal.   methocarbamol 500 MG tablet Commonly known as: ROBAXIN Take 1 tablet (500 mg total) by mouth every 6 (six) hours as needed for muscle spasms. What changed:  when to take this reasons to take this   methylPREDNISolone 4 MG Tbpk tablet Commonly known as: MEDROL DOSEPAK Take by mouth daily - taper daily dose per package instructions.   oxyCODONE 5 MG immediate release tablet Commonly known as: Roxicodone Take 1 tablet (5 mg total) by mouth every 4 (four) hours as needed for severe pain.   pravastatin 80 MG tablet Commonly known as: PRAVACHOL Take 80 mg by mouth daily.   QUEtiapine 25 MG tablet Commonly known as: SEROQUEL Take 25 mg by mouth at bedtime.   rOPINIRole 0.5 MG tablet Commonly known as: REQUIP Take 0.5  mg by mouth at bedtime.   Semaglutide 14 MG Tabs Take 14 mg by mouth daily.   sildenafil 100 MG tablet Commonly known as: VIAGRA Take 100 mg by mouth daily as needed for erectile dysfunction.   testosterone cypionate 200 MG/ML injection Commonly known as: DEPOTESTOSTERONE CYPIONATE Inject into the muscle every 14 (fourteen) days.        Follow-up Information     Loreen Freud, MD Follow up.   Specialty: Neurosurgery Why: Already scheduled for  Monday June 19, 2023 @ 9:30 AM Contact information: [address] [place] [address] [phone]                 Signed: Susanne Borders 06/05/2023, 12:04 PM

## 2023-06-05 NOTE — Op Note (Signed)
Indications: [name] is suffering from a cervical radiculopathy.  The patient tried and failed conservative management, prompting surgical intervention.  He has significant weakness in the right upper extremity that was acute and progressive.    Findings: Herniated C7-T1 disc causing displacement of the C8 nerve root  Preoperative Diagnosis: Cervical radiculopathy at C8  M54.12   Herniated cervical intervertebral disc M50.20             Right hand weaknessR29.898.    Postoperative Diagnosis: same   EBL: 150 ml IVF: see anesthesia record Drains: none Disposition: Extubated and Stable to PACU Complications: none  No foley catheter was placed.   Preoperative Note: Patient was seen in the preoperative area.  He continued to have significant right hand weakness consistent with C8 radiculopathy.  He continued to have severe radiating pain into the right C8 dermatome.  Given his weakness and disc herniation we planned for decompression and discectomy from the back.  Risks of surgery discussed include: infection, bleeding, stroke, coma, death, paralysis, CSF leak, nerve/spinal cord injury, numbness, tingling, weakness, complex regional pain syndrome, recurrent stenosis and/or disc herniation, vascular injury, development of instability, neck/back pain, need for further surgery, persistent symptoms, development of deformity, and the risks of anesthesia. The patient understood these risks and agreed to proceed.  Operative Note:   1) Right C7/T1 microdiscectomy  The patient was then brought from the preoperative center with intravenous access established.  The patient underwent general anesthesia and endotracheal tube intubation, and was then rotated on the flattop, with Mayfield pins.  All of their pressure points were appropriately padded.  The skin was then thoroughly cleansed.  Perioperative antibiotic prophylaxis was administered.  Sterile prep and drapes were then applied and a  timeout was then observed.  C-arm was brought into the field under sterile conditions, and the C7-T1 disc space identified and marked with an incision on the right 1cm lateral to midline.  Once this was complete a 2 cm incision was opened with the use of a #10 blade knife.  The Metrx tubes were sequentially advanced under lateral fluoroscopy until a 18 x 50 mm Metrx tube was placed over lateral mass and facet joint of C7 and T1.  The microscope was then sterilely brought into the field and muscle creep was hemostased with a bipolar and resected with a pituitary rongeur.  A Bovie extender was then used to expose the spinous process and lamina.  Careful attention was placed to not violate the facet capsule.  At this point after fluoroscopic verification, we are able to identify the inferior lamina of C7, the medial facet joint of C7, and the superior lamina of T1.  A 3 mm matchstick drill bit was then used to make a hemi-laminotomy trough until the ligamentum flavum was exposed at the anterior level of C7.  We are then able to bring the dissection laterally along the medial aspect of the facet joint.  Once we had reached a point lateral and of we then started to drill caudally at which point the medial inferior portion of the C7 lateral mass was able to be removed.  This exposed the superior facet of T1 at the medial portion.  We then turned our attention to T1 hemilaminotomy with a matchstick drill.  We brought this down caudally.  We then in the same fashion drifted laterally once we reached a lateral position matching that of the level above we brought this cranially to disconnect the superior medial aspect of  the T1 facet.  At this point we were able to clearly isolate the ligamentum.  With a Penfield 4 we were able to dissected to the epidural space.  We remove the ligamentum with a Kerr[place] rongeur.  This helped [place] to identify clearly the C8 nerve root.  This was displaced superiorly and posteriorly by a  herniated disc fragment.  We utilized the pedicle of T1 followed down to the C7-T1 disc space at which point we could see the herniated disc.  While protecting the nerve root we began to extract the extruded fragment.  We then palpated and were able to find another smaller fragment more caudally.  At that point we were able to fully decompress the nerve root.  The nerve root then descended and lateralized into a more natural position.  We then performed a significant amount of irrigation.  We then obtained hemostasis.  At this point the nerve was well decompressed both on the superior and inferior aspects.  At this point we placed Depo-Medrol into the nerve root.  We then closed in multiple layers..  The fascial layer was reapproximated with the use of a 0- Vicryl suture.  Subcutaneous tissue layer was reapproximated using 2-0 Vicryl suture.  3-0 monocryl was used on the skin. The skin was then cleansed and Dermabond was used to close the skin opening.  Patient was then rotated back to the preoperative bed awakened from anesthesia and taken to recovery all counts are correct in this case.   I performed the entire procedure with the assistance of Manning Charity PA as an Designer, television/film set. An assistant was required for this procedure due to the complexity.  The assistant provided assistance in tissue manipulation and suction, and was required for the successful and safe performance of the procedure. I performed the critical portions of the procedure.   Lovenia Kim, MD

## 2023-06-05 NOTE — Evaluation (Signed)
Occupational Therapy Evaluation Patient Details Name: [name] MRN: [id number] DOB: [birth year] Today's Date: 06/05/2023   History of Present Illness Pt is a 67 yo male s/p transmetatarsal amputation, L achilles tendon lengthening. PMH of smoking, HTN, CHF, DM, depression. Now s/p Right C7/T1 microdiscectomy   Clinical Impression   Mr Hughes was seen for OT evaluation this date. Prior to hospital admission, pt was IND. Pt lives alone, family nearby. Pt currently requires  MOD I don/doff B socks and compression socks in sitting, posterior lean noted with dynamic activity. SUPERVISION + RW for ADL t/f, poor eccentric control noted. Grossly 4/5 RUE strength, triceps 3+/5. Pt would benefit from skilled OT to address noted impairments and functional limitations (see below for any additional details). Upon hospital discharge, recommend follow up OT.     Recommendations for follow up therapy are one component of a multi-disciplinary discharge planning process, led by the attending physician.  Recommendations may be updated based on patient status, additional functional criteria and insurance authorization.   Assistance Recommended at Discharge Set up Supervision/Assistance  Patient can return home with the following A little help with walking and/or transfers    Functional Status Assessment  Patient has had a recent decline in their functional status and demonstrates the ability to make significant improvements in function in a reasonable and predictable amount of time.  Equipment Recommendations  None recommended by OT    Recommendations for Other Services       Precautions / Restrictions Precautions Precautions: Cervical Precaution Booklet Issued: Yes (comment) Restrictions Weight Bearing Restrictions: No      Mobility Bed Mobility Overal bed mobility: Modified Independent                  Transfers Overall transfer level: Needs assistance Equipment used: None Transfers:  Sit to/from Stand Sit to Stand: Min guard           General transfer comment: poor eccentric control      Balance Overall balance assessment: Needs assistance Sitting-balance support: Feet supported Sitting balance-Leahy Scale: Good     Standing balance support: During functional activity, No upper extremity supported Standing balance-Leahy Scale: Poor Standing balance comment: requires single UE support                           ADL either performed or assessed with clinical judgement   ADL Overall ADL's : Needs assistance/impaired                                       General ADL Comments: MOD I don/doff B socks and compression socks in sitting, posterior lean noted with dynamic activity. SUPERVISION + RW for ADL t/f, poor eccentric control noted      Pertinent Vitals/Pain Pain Assessment Pain Assessment: Faces Faces Pain Scale: Hurts a little bit Pain Location: posterior neck soreness Pain Descriptors / Indicators: Sore Pain Intervention(s): Limited activity within patient's tolerance, Repositioned     Hand Dominance Right   Extremity/Trunk Assessment Upper Extremity Assessment Upper Extremity Assessment: RUE deficits/detail RUE Deficits / Details: Tricep 3+/5, Anterior deltoid 4/5, grip 4/5. Bicep 5/5   Lower Extremity Assessment Lower Extremity Assessment: Defer to PT evaluation   Cervical / Trunk Assessment Cervical / Trunk Assessment: Neck Surgery   Communication Communication Communication: No difficulties   Cognition Arousal/Alertness: Awake/alert Behavior During Therapy: WFL for tasks assessed/performed Overall  Cognitive Status: Within Functional Limits for tasks assessed                                 General Comments: decreased safety awareness                Home Living Family/patient expects to be discharged to:: Private residence Living Arrangements: Alone Available Help at Discharge:  Neighbor;Available PRN/intermittently (reports nephew lives next door) Type of Home: House Home Access: Stairs to enter [employer] of Steps: 1 Entrance Stairs-Rails: Can reach both Home Layout: One level     Bathroom Shower/Tub: Tub/shower unit         Home Equipment: Grab bars - toilet;Grab bars - tub/shower;Rolling Walker (2 wheels);Shower seat;Rollator (4 wheels)          Prior Functioning/Environment Prior Level of Function : Independent/Modified Independent;Driving             Mobility Comments: furniture walks          OT Problem List: Decreased strength;Decreased activity tolerance;Impaired balance (sitting and/or standing);Decreased safety awareness      OT Treatment/Interventions: Self-care/ADL training;Therapeutic exercise;Energy conservation;DME and/or AE instruction;Therapeutic activities;Balance training;Patient/family education    OT Goals(Current goals can be found in the care plan section) Acute Rehab OT Goals Patient Stated Goal: to go home OT Goal Formulation: With patient Time For Goal Achievement: 06/19/23 Potential to Achieve Goals: Good ADL Goals Pt Will Perform Grooming: Independently;standing Pt Will Perform Lower Body Dressing: Independently;sit to/from stand Pt Will Transfer to Toilet: Independently;ambulating;regular height toilet  OT Frequency: Min 1X/week    Co-evaluation              AM-PAC OT "6 Clicks" Daily Activity     Outcome Measure Help from another person eating meals?: None Help from another person taking care of personal grooming?: None Help from another person toileting, which includes using toliet, bedpan, or urinal?: A Little Help from another person bathing (including washing, rinsing, drying)?: A Little Help from another person to put on and taking off regular upper body clothing?: None Help from another person to put on and taking off regular lower body clothing?: A Little 6 Click Score: 21    End of Session Equipment Utilized During Treatment: Rolling walker (2 wheels) Nurse Communication: Mobility status  Activity Tolerance: Patient tolerated treatment well Patient left: in bed;with call bell/phone within reach;with bed alarm set  OT Visit Diagnosis: Unsteadiness on feet (R26.81)                Time: 1349-1401 OT Time Calculation (min): 12 min Charges:  OT General Charges $OT Visit: 1 Visit OT Evaluation $OT Eval Low Complexity: 1 Low  Kathie Dike, M.S. OTR/L  06/05/23, 3:03 PM  ascom [phone]

## 2023-06-05 NOTE — Discharge Summary (Cosign Needed)
Discharge Summary  Patient ID: [name] MRN: [id number] DOB/AGE: [birth year] 67 y.o.  Admit date: 06/05/2023 Discharge date: 06/06/2023  Admission Diagnoses: Cervical radiculopathy Discharge Diagnoses:  Principal Problem:   Cervical radiculopathy Active Problems:   Radiculopathy, cervical   Discharged Condition: good  Hospital Course:  [name] is a 67 year old presenting with right-sided cervical radiculopathy status post right C7-T1 microdiscectomy.  His intraoperative course was uncomplicated.  On examination in PACU, he was noted to have increased hand strength on the right. He was admitted overnight for pain control. He was deemed appropriate for discharge home on POD1. He was given prescriptions for oxycodone and Robaxin.  Consults: None  Significant Diagnostic Studies: none  Treatments: surgery: as above. Please see separately dictated operative report for further details.  Discharge Exam: Blood pressure 114/72, pulse 71, temperature 98.2 F (36.8 C), resp. rate 17, height 6\' 2"  (1.88 m), weight 106.1 kg, SpO2 93%. CN grossly intact 5/5 throughout except 4/5 right IO and HG Incision c/d/I with post-op dressing in place  Disposition: Discharge disposition: 01-Home or Self Care        Discharge Instructions     Incentive spirometry RT   Complete by: As directed       Allergies as of 06/06/2023   No Known Allergies      Medication List     STOP taking these medications    HYDROcodone-acetaminophen 5-325 MG tablet Commonly known as: NORCO/VICODIN       TAKE these medications    allopurinol 300 MG tablet Commonly known as: ZYLOPRIM Take 300 mg by mouth daily.   atorvastatin 80 MG tablet Commonly known as: Lipitor Take 1 tablet (80 mg total) by mouth daily.   B-D UF III MINI PEN NEEDLES 31G X 5 MM Misc Generic drug: Insulin Pen Needle Inject 1 each into the skin 3 (three) times daily.   BD Disp Needles 18G X 1-1/2" Misc Generic  drug: NEEDLE (DISP) 18 G   BD PrecisionGlide Needle 23G X 1-1/2" Misc Generic drug: NEEDLE (DISP) 23 G   buPROPion 150 MG 24 hr tablet Commonly known as: WELLBUTRIN XL Take 150 mg by mouth daily.   cyanocobalamin 100 MCG tablet Take 100 mcg by mouth daily.   empagliflozin 25 MG Tabs tablet Commonly known as: JARDIANCE Take 25 mg by mouth daily.   FreeStyle Libre 14 Day Reader Hardie Pulley Use 1 Device as directed   gabapentin 400 MG capsule Commonly known as: NEURONTIN Take 400 mg by mouth 3 (three) times daily.   Lantus SoloStar 100 UNIT/ML Solostar Pen Generic drug: insulin glargine Inject 6 Units into the skin at bedtime.   lisinopril 5 MG tablet Commonly known as: ZESTRIL Take 5 mg by mouth daily.   metFORMIN 500 MG tablet Commonly known as: GLUCOPHAGE Take 1,000 mg by mouth 2 (two) times daily with a meal.   methocarbamol 500 MG tablet Commonly known as: ROBAXIN Take 1 tablet (500 mg total) by mouth every 6 (six) hours as needed for muscle spasms. What changed:  when to take this reasons to take this   methylPREDNISolone 4 MG Tbpk tablet Commonly known as: MEDROL DOSEPAK Take by mouth daily - taper daily dose per package instructions.   oxyCODONE 5 MG immediate release tablet Commonly known as: Roxicodone Take 1 tablet (5 mg total) by mouth every 4 (four) hours as needed for severe pain.   pravastatin 80 MG tablet Commonly known as: PRAVACHOL Take 80 mg by mouth daily.   QUEtiapine 25 MG  tablet Commonly known as: SEROQUEL Take 25 mg by mouth at bedtime.   rOPINIRole 0.5 MG tablet Commonly known as: REQUIP Take 0.5 mg by mouth at bedtime.   Semaglutide 14 MG Tabs Take 14 mg by mouth daily.   sildenafil 100 MG tablet Commonly known as: VIAGRA Take 100 mg by mouth daily as needed for erectile dysfunction.   testosterone cypionate 200 MG/ML injection Commonly known as: DEPOTESTOSTERONE CYPIONATE Inject into the muscle every 14 (fourteen) days.          Follow-up Information     Loreen Freud, MD Follow up.   Specialty: Neurosurgery Why: Already scheduled for Monday June 19, 2023 @ 9:30 AM Contact information: [address] [place] [address] [phone]                 Signed: Susanne Borders 06/06/2023, 8:58 AM

## 2023-06-05 NOTE — Discharge Instructions (Addendum)
Your surgeon has performed an operation on your cervical spine (neck) to relieve pressure on one or more nerves. Many times, patients feel better immediately after surgery and can "overdo it." Even if you feel well, it is important that you follow these activity guidelines. If you do not let your back heal properly from the surgery, you can increase the chance of a disc herniation and/or return of your symptoms. The following are instructions to help in your recovery once you have been discharged from the hospital.  * It is ok to take NSAIDs after surgery.  Activity    No bending, lifting, or twisting ("BLT"). Avoid lifting objects heavier than 10 pounds (gallon milk jug).  Where possible, avoid household activities that involve lifting, bending, pushing, or pulling such as laundry, vacuuming, grocery shopping, and childcare. Try to arrange for help from friends and family for these activities while your back heals.  Increase physical activity slowly as tolerated.  Taking short walks is encouraged, but avoid strenuous exercise. Do not jog, run, bicycle, lift weights, or participate in any other exercises unless specifically allowed by your doctor. Avoid prolonged sitting, including car rides.  Talk to your doctor before resuming sexual activity.  You should not drive until cleared by your doctor.  Until released by your doctor, you should not return to work or school.  You should rest at home and let your body heal.   You may shower three days after your surgery.  After showering, lightly dab your incision dry. Do not take a tub bath or go swimming for 3 weeks, or until approved by your doctor at your follow-up appointment.  If you smoke, we strongly recommend that you quit.  Smoking has been proven to interfere with normal healing in your back and will dramatically reduce the success rate of your surgery. Please contact [facility] (800-QUIT-NOW) and use the resources at www.[facility].com for  assistance in stopping smoking.  Surgical Incision   If you have a dressing on your incision, you may remove it three days after your surgery. Keep your incision area clean and dry.  If you have staples or stitches on your incision, you should have a follow up scheduled for removal. If you do not have staples or stitches, you will have steri-strips (small pieces of surgical tape) or Dermabond glue. The steri-strips/glue should begin to peel away within about a week (it is fine if the steri-strips fall off before then). If the strips are still in place one week after your surgery, you may gently remove them.  Diet            You may return to your usual diet. Be sure to stay hydrated.  When to Contact us  Although your surgery and recovery will likely be uneventful, you may have some residual numbness, aches, and pains in your back and/or legs. This is normal and should improve in the next few weeks.  However, should you experience any of the following, contact us immediately: New numbness or weakness Pain that is progressively getting worse, and is not relieved by your pain medications or rest Bleeding, redness, swelling, pain, or drainage from surgical incision Chills or flu-like symptoms Fever greater than 101.0 F (38.3 C) Problems with bowel or bladder functions Difficulty breathing or shortness of breath Warmth, tenderness, or swelling in your calf  Contact Information How to contact us:  If you have any questions/concerns before or after surgery, you can reach [place] at [phone], or you can send  a [employer]. We can be reached by phone or mychart 8am-4pm, Monday-Friday.  *Please note: Calls after 4pm are forwarded to a third party answering service. Mychart messages are not routinely monitored during evenings, weekends, and holidays. Please call our office to contact the answering service for urgent concerns during non-business hours.   AMBULATORY SURGERY  DISCHARGE  INSTRUCTIONS   The drugs that you were given will stay in your system until tomorrow so for the next 24 hours you should not:  Drive an automobile Make any legal decisions Drink any alcoholic beverage   You may resume regular meals tomorrow.  Today it is better to start with liquids and gradually work up to solid foods.  You may eat anything you prefer, but it is better to start with liquids, then soup and crackers, and gradually work up to solid foods.   Please notify your doctor immediately if you have any unusual bleeding, trouble breathing, redness and pain at the surgery site, drainage, fever, or pain not relieved by medication.    Additional Instructions:  Please contact your physician with any problems or Same Day Surgery at [phone], Monday through Friday 6 am to 4 pm, or [place] at [facility] number at [phone].

## 2023-06-05 NOTE — Evaluation (Signed)
Physical Therapy Evaluation Patient Details Name: [name] MRN: [id number] DOB: May 17, 1956 Today's Date: 06/05/2023  History of Present Illness  Pt is a 67 y/o M admitted on 06/05/23 for R minimally invasive C7-T1 discectomy. PMH: gout, DM, L transmet amputation  Clinical Impression  Pt seen for PT evaluation with pt agreeable. PT educates pt on cervical precautions with pt voicing understanding. Pt reports prior to admission he was independent without AD, lives alone, still driving. Pt attempts gait but very unsteady & holding to furniture throughout with pt then reporting he furniture walks. PT provides pt with RW & educates him on recommendation to use AD for mobility with pt voicing understanding, stating he will use his rollator vs RW. Pt is able to ambulate 1 lap around nurses station with RW & CGA with impaired balance as noted below. Pt also demonstrates very poor eccentric lowering with stand>sit with decreased safety awareness. Pt would benefit from ongoing PT services to address balance, gait with LRAD, & safety awareness.      If plan is discharge home, recommend the following: Assistance with cooking/housework;Assist for transportation   Can travel by private vehicle        Equipment Recommendations None recommended by PT (pt reports he already has a RW)  Recommendations for Other Services       Functional Status Assessment Patient has had a recent decline in their functional status and demonstrates the ability to make significant improvements in function in a reasonable and predictable amount of time.     Precautions / Restrictions Precautions Precautions: Fall;Cervical Precaution Booklet Issued: Yes (comment) Restrictions Weight Bearing Restrictions: No      Mobility  Bed Mobility Overal bed mobility: Modified Independent Bed Mobility: Supine to Sit     Supine to sit: Modified independent (Device/Increase time), HOB elevated     General bed mobility  comments: use of bed rails    Transfers Overall transfer level: Needs assistance Equipment used: None Transfers: Sit to/from Stand Sit to Stand: Supervision           General transfer comment: STS from EOB without AD. During stand>sit pt with extremely poor eccentric lowering & instead plops/falls onto EOB & uses bed rails to stop posterior momentum as well as min assist from PT. Pt educated on safety with stand>sit but poor return demo.    Ambulation/Gait Ambulation/Gait assistance: Min guard, Min assist Gait Distance (Feet): 5 Feet (+ 170) Assistive device: None, Rolling walker (2 wheels)   Gait velocity: decreased     General Gait Details: Pt initially begins ambulating without AD bur holding to furniture throughout. Pt with 1 posterior LOB with min assist to correct but pt does not seem bothered by this occurance. Pt reports he furniture walks at home. Educated pt on recommendation to use AD & PT provided pt with RW. Pt ambulates 1 lap around nurses station with RW & CGA with impaired balance, somewhat decreased BLE coordination when stepping.  Stairs            Wheelchair Mobility     Tilt Bed    Modified Rankin (Stroke Patients Only)       Balance Overall balance assessment: Needs assistance   Sitting balance-Leahy Scale: Good     Standing balance support: During functional activity, No upper extremity supported Standing balance-Leahy Scale: Poor  Pertinent Vitals/Pain Pain Assessment Pain Assessment: Faces Faces Pain Scale: Hurts a little bit Pain Location: posterior neck soreness Pain Descriptors / Indicators: Sore Pain Intervention(s): Monitored during session    Home Living Family/patient expects to be discharged to:: Private residence Living Arrangements: Alone   Type of Home: House Home Access: Stairs to enter Entrance Stairs-Rails: Right;Left;Can reach both Entrance Stairs-Number of Steps: 1    Home Layout: One level Home Equipment: Rollator (4 wheels);Rolling Walker (2 wheels);Cane - single point      Prior Function Prior Level of Function : Independent/Modified Independent;Driving             Mobility Comments: Denies falls in the past 6 months, furniture walks. Reports he sleeps in recliner.       Hand Dominance   Dominant Hand: Right    Extremity/Trunk Assessment   Upper Extremity Assessment Upper Extremity Assessment:  (pt reports he can use his RUE again)    Lower Extremity Assessment Lower Extremity Assessment:  (not formally tested; old L transmet amputation)    Cervical / Trunk Assessment Cervical / Trunk Assessment:  (posterior cervical incision)  Communication   Communication: HOH  Cognition Arousal/Alertness: Awake/alert Behavior During Therapy: WFL for tasks assessed/performed Overall Cognitive Status: No family/caregiver present to determine baseline cognitive functioning Area of Impairment: Safety/judgement                         Safety/Judgement: Decreased awareness of deficits, Decreased awareness of safety              General Comments      Exercises     Assessment/Plan    PT Assessment Patient needs continued PT services  PT Problem List Decreased strength;Decreased coordination;Pain;Decreased activity tolerance;Decreased knowledge of precautions;Decreased mobility;Decreased safety awareness;Decreased knowledge of use of DME;Decreased balance;Decreased cognition       PT Treatment Interventions DME instruction;Therapeutic exercise;Gait training;Balance training;Stair training;Neuromuscular re-education;Functional mobility training;Therapeutic activities;Patient/family education    PT Goals (Current goals can be found in the Care Plan section)  Acute Rehab PT Goals Patient Stated Goal: go home PT Goal Formulation: With patient Time For Goal Achievement: 06/19/23 Potential to Achieve Goals: Good    Frequency  Min 1X/week     Co-evaluation               AM-PAC PT "6 Clicks" Mobility  Outcome Measure Help needed turning from your back to your side while in a flat bed without using bedrails?: None Help needed moving from lying on your back to sitting on the side of a flat bed without using bedrails?: A Little Help needed moving to and from a bed to a chair (including a wheelchair)?: A Little Help needed standing up from a chair using your arms (e.g., wheelchair or bedside chair)?: A Little Help needed to walk in hospital room?: A Little Help needed climbing 3-5 steps with a railing? : A Little 6 Click Score: 19    End of Session Equipment Utilized During Treatment: Gait belt Activity Tolerance: Patient tolerated treatment well Patient left:  (sitting EOB in handoff to OT) Nurse Communication: Mobility status PT Visit Diagnosis: Unsteadiness on feet (R26.81);Muscle weakness (generalized) (M62.81);Other abnormalities of gait and mobility (R26.89);Difficulty in walking, not elsewhere classified (R26.2)    Time: 5573-2202 PT Time Calculation (min) (ACUTE ONLY): 10 min   Charges:   PT Evaluation $PT Eval Low Complexity: 1 Low   PT General Charges $$ ACUTE PT VISIT: 1 Visit  Aleda Grana, PT, DPT 06/05/23, 2:19 PM   Sandi Mariscal 06/05/2023, 2:16 PM

## 2023-06-05 NOTE — Transfer of Care (Signed)
Immediate Anesthesia Transfer of Care Note  Patient: [name]  Procedure(s) Performed: RIGHT MINIMALLY INVASIVE (MIS) C7-T1 DISCECTOMY (Right)  Patient Location: PACU  Anesthesia Type:General  Level of Consciousness: awake, alert , and oriented  Airway & Oxygen Therapy: Patient Spontanous Breathing and Patient connected to face mask oxygen  Post-op Assessment: Report given to RN and Post -op Vital signs reviewed and stable  Post vital signs: Reviewed and stable  Last Vitals:  Vitals Value Taken Time  BP 143/74 06/05/23 1146  Temp 36.1 C 06/05/23 1145  Pulse 72 06/05/23 1149  Resp 15 06/05/23 1149  SpO2 99 % 06/05/23 1149  Vitals shown include unfiled device data.  Last Pain:  Vitals:   06/05/23 0758  TempSrc: Temporal  PainSc: 6       Patients Stated Pain Goal: 0 (06/05/23 0758)  Complications: No notable events documented.

## 2023-06-05 NOTE — Anesthesia Procedure Notes (Signed)
Procedure Name: Intubation Date/Time: 06/05/2023 9:45 AM  Performed by: Morene Crocker, CRNAPre-anesthesia Checklist: Patient identified, Patient being monitored, Timeout performed, Emergency Drugs available and Suction available Patient Re-evaluated:Patient Re-evaluated prior to induction Oxygen Delivery Method: Circle system utilized Preoxygenation: Pre-oxygenation with 100% oxygen Induction Type: IV induction Ventilation: Mask ventilation without difficulty and Oral airway inserted - appropriate to patient size Laryngoscope Size: 3 and McGraph Grade View: Grade I Tube type: Oral Tube size: 7.0 mm Number of attempts: 1 Airway Equipment and Method: Stylet Placement Confirmation: ETT inserted through vocal cords under direct vision, positive ETCO2 and breath sounds checked- equal and bilateral Secured at: 23 cm Tube secured with: Tape Dental Injury: Teeth and Oropharynx as per pre-operative assessment  Comments: Smooth atraumatic intubation, no complications noted.

## 2023-06-05 NOTE — Anesthesia Postprocedure Evaluation (Signed)
Anesthesia Post Note  Patient: [name]  Procedure(s) Performed: RIGHT MINIMALLY INVASIVE (MIS) C7-T1 DISCECTOMY (Right)  Patient location during evaluation: PACU Anesthesia Type: General Level of consciousness: awake and alert, oriented and patient cooperative Pain management: pain level controlled Vital Signs Assessment: post-procedure vital signs reviewed and stable Respiratory status: spontaneous breathing, nonlabored ventilation and respiratory function stable Cardiovascular status: blood pressure returned to baseline and stable Postop Assessment: adequate PO intake Anesthetic complications: no   No notable events documented.   Last Vitals:  Vitals:   06/05/23 0758 06/05/23 1145  BP: 131/82 (!) 143/74  Pulse: 80 74  Resp: 18 12  Temp: 37.1 C (!) 36.1 C  SpO2: 98% 100%    Last Pain:  Vitals:   06/05/23 1215  TempSrc:   PainSc: 0-No pain                 Reed Breech

## 2023-06-06 ENCOUNTER — Other Ambulatory Visit: Payer: Self-pay | Admitting: Neurosurgery

## 2023-06-06 ENCOUNTER — Encounter: Payer: Self-pay | Admitting: Neurosurgery

## 2023-06-06 DIAGNOSIS — Z9889 Other specified postprocedural states: Secondary | ICD-10-CM

## 2023-06-06 DIAGNOSIS — M5412 Radiculopathy, cervical region: Secondary | ICD-10-CM

## 2023-06-06 DIAGNOSIS — M5023 Other cervical disc displacement, cervicothoracic region: Secondary | ICD-10-CM | POA: Diagnosis not present

## 2023-06-06 LAB — GLUCOSE, CAPILLARY: Glucose-Capillary: 261 mg/dL — ABNORMAL HIGH (ref 70–99)

## 2023-06-06 NOTE — TOC Progression Note (Signed)
Transition of Care [facility]) - Progression Note    Patient Details  Name: [name] MRN: [id number] Date of Birth: 06-17-1956  Transition of [facility]) CM/SW Contact  Marlowe Sax, RN Phone Number: 06/06/2023, 9:15 AM  Clinical Narrative:     The patient lives at home alone Has DME rollator, RW and Alen Blew, Lisabeth Pick, NP Home health set up prior to surgery by surgeons office  Expected Discharge Plan: Home w Home Health Services Barriers to Discharge: No Barriers Identified  Expected Discharge Plan and Services   Discharge Planning Services: CM Consult   Living arrangements for the past 2 months: Single Family Home Expected Discharge Date: 06/06/23               DME Arranged: N/A DME Agency: NA       HH Arranged: PT, OT HH Agency: [facility] Date HH Agency Contacted: 06/06/23 Time HH Agency Contacted: 0911 Representative spoke with at [facility] Agency: Bertram Gala   Social Determinants of Health (SDOH) Interventions SDOH Screenings   Food Insecurity: No Food Insecurity (06/05/2023)  Housing: Low Risk  (06/05/2023)  Transportation Needs: No Transportation Needs (06/05/2023)  Utilities: Not At Risk (06/05/2023)  Financial Resource Strain: Low Risk  (02/15/2022)   [facility], [facility]  Physical Activity: Insufficiently Active (02/15/2022)   Received from [facility], [facility]  Social Connections: Moderately Isolated (02/15/2022)   Received from [facility], [facility]  Stress: No Stress Concern Present (02/15/2022)   [facility] from [facility], [facility]  Tobacco Use: Medium Risk (06/05/2023)    Readmission Risk Interventions     No data to display

## 2023-06-08 ENCOUNTER — Telehealth: Payer: Self-pay | Admitting: Neurosurgery

## 2023-06-08 NOTE — Telephone Encounter (Signed)
Irving Burton from [place] called asking for verbal orders 1w x 1 1w x 2 Also wanted to see about possibly adding OT for his hand  CB: [phone]

## 2023-06-08 NOTE — Telephone Encounter (Signed)
LVM with Irving Burton to give our office a call back on 06/09/2023 , Okay for PT and OT     Augusto Gamble CMA  [facility] Neurosurgery at [facility] of Dr Venetia Night, Dr Ernestine Mcmurray, Manning Charity, PA-C, [place], [place] Ph: [phone] Fax: [phone]     ** Please note that MyChart messages are not monitored during evenings, weekends, and holidays. We can be reached by phone 8am-4pm, Monday-Friday. Please call our office [phone]) to contact the answering service for urgent concerns during non-business hours.

## 2023-06-09 ENCOUNTER — Inpatient Hospital Stay: Admission: RE | Admit: 2023-06-09 | Payer: Medicare Other | Source: Ambulatory Visit

## 2023-06-09 ENCOUNTER — Telehealth: Payer: Self-pay

## 2023-06-09 NOTE — Telephone Encounter (Signed)
[name] from [place] called asking for verbal orders 1w x 1 1w x 2 Also wanted to see about possibly adding OT for his hand   CB: [phone]   Spoke with [name] from [place] gave verbal orders PT and OT for his hands

## 2023-06-13 ENCOUNTER — Telehealth: Payer: Self-pay | Admitting: Neurosurgery

## 2023-06-13 NOTE — Telephone Encounter (Signed)
[name] with [name] called to let us know that the pt is Scheduled to start outpatient PT today 8/13 so will discontinue [facility]

## 2023-06-13 NOTE — Telephone Encounter (Signed)
Noted  

## 2023-06-19 ENCOUNTER — Ambulatory Visit: Payer: Medicare Other | Admitting: Neurosurgery

## 2023-06-19 ENCOUNTER — Encounter: Payer: Self-pay | Admitting: Neurosurgery

## 2023-06-19 VITALS — BP 116/78 | Temp 97.8°F | Ht 74.0 in | Wt 234.0 lb

## 2023-06-19 DIAGNOSIS — Z09 Encounter for follow-up examination after completed treatment for conditions other than malignant neoplasm: Secondary | ICD-10-CM

## 2023-06-19 DIAGNOSIS — M5023 Other cervical disc displacement, cervicothoracic region: Secondary | ICD-10-CM

## 2023-06-19 DIAGNOSIS — M5412 Radiculopathy, cervical region: Secondary | ICD-10-CM

## 2023-06-19 NOTE — Progress Notes (Signed)
   DOS: 06/05/2023  HISTORY OF PRESENT ILLNESS: 06/19/2023 Mr. Kashmere Covin is status post right C7-T1 microdiscectomy  PHYSICAL EXAMINATION:   Vitals:   06/19/23 0926  BP: 116/78  Temp: 97.8 F (36.6 C)   General: Patient is well developed, well nourished, calm, collected, and in no apparent distress.  NEUROLOGICAL:  General: In no acute distress.  Awake, alert, oriented to person, place, and time. Pupils equal round and reactive to light.   Strength: Side Biceps Triceps Deltoid Interossei Grip Wrist Ext. Wrist Flex.  R 5 5 5 4  4+ 5 5  L 5 5 5 5 5 5 5    Incision c/d/i   ROS (Neurologic): Negative except as noted above  IMAGING: No interval imaging to review   ASSESSMENT/PLAN:  Kenyata Sittler is doing well after his right-sided C7-T1 microdiscectomy.  He has had a significant improvement in his pain.  He also feels an improvement in his grip strength and hand strength overall.  He is doing well.  Incision is doing well.  He is no longer taking any pain medications.  He has been progressing back to his daily functions.  Will continue to follow him as scheduled previously.   Lovenia Kim, MD/MSCR Department of Neurosurgery

## 2023-07-17 ENCOUNTER — Encounter: Payer: Self-pay | Admitting: Neurosurgery

## 2023-07-17 ENCOUNTER — Ambulatory Visit (INDEPENDENT_AMBULATORY_CARE_PROVIDER_SITE_OTHER): Payer: Medicare Other | Admitting: Neurosurgery

## 2023-07-17 VITALS — BP 126/74 | Temp 98.3°F | Ht 74.0 in | Wt 234.0 lb

## 2023-07-17 DIAGNOSIS — M5020 Other cervical disc displacement, unspecified cervical region: Secondary | ICD-10-CM

## 2023-07-17 DIAGNOSIS — Z09 Encounter for follow-up examination after completed treatment for conditions other than malignant neoplasm: Secondary | ICD-10-CM

## 2023-07-17 DIAGNOSIS — Z9889 Other specified postprocedural states: Secondary | ICD-10-CM

## 2023-07-17 DIAGNOSIS — M5412 Radiculopathy, cervical region: Secondary | ICD-10-CM

## 2023-07-17 NOTE — Progress Notes (Signed)
   DOS: 06/05/2023  HISTORY OF PRESENT ILLNESS: 07/17/2023 [name] is status post right C7-T1 microdiscectomy  PHYSICAL EXAMINATION:   Vitals:   07/17/23 0912  BP: 126/74  Temp: 98.3 F (36.8 C)   General: Patient is well developed, well nourished, calm, collected, and in no apparent distress.  NEUROLOGICAL:  General: In no acute distress.  Awake, alert, oriented to person, place, and time. Pupils equal round and reactive to light.   Strength: Side Biceps Triceps Deltoid Interossei Grip Wrist Ext. Wrist Flex.  R 5 5 5 4  4+ 5 5  L 5 5 5 5 5 5 5    Incision c/d/i   ROS (Neurologic): Negative except as noted above  IMAGING: No interval imaging to review   ASSESSMENT/PLAN:  [name] is doing well after his right-sided C7-T1 microdiscectomy.  He has had a significant improvement in both his strength and his radicular pain.  Overall he is doing well.  He would like to get back to being more active.  At this point he can continue to increase his work capacity as tolerated.  We discussed red flag symptoms.  He will follow-up in 3 months postsurgery.   Lovenia Kim, MD/MSCR Department of Neurosurgery

## 2023-07-26 IMAGING — CR DG FOOT COMPLETE 3+V*L*
3 series · 3 of 3 positions shown · non-contrast
Comparison: None.

CLINICAL DATA: Non-healing wound left great toe.

EXAM:
LEFT FOOT - COMPLETE 3+ VIEW

[foot ap]
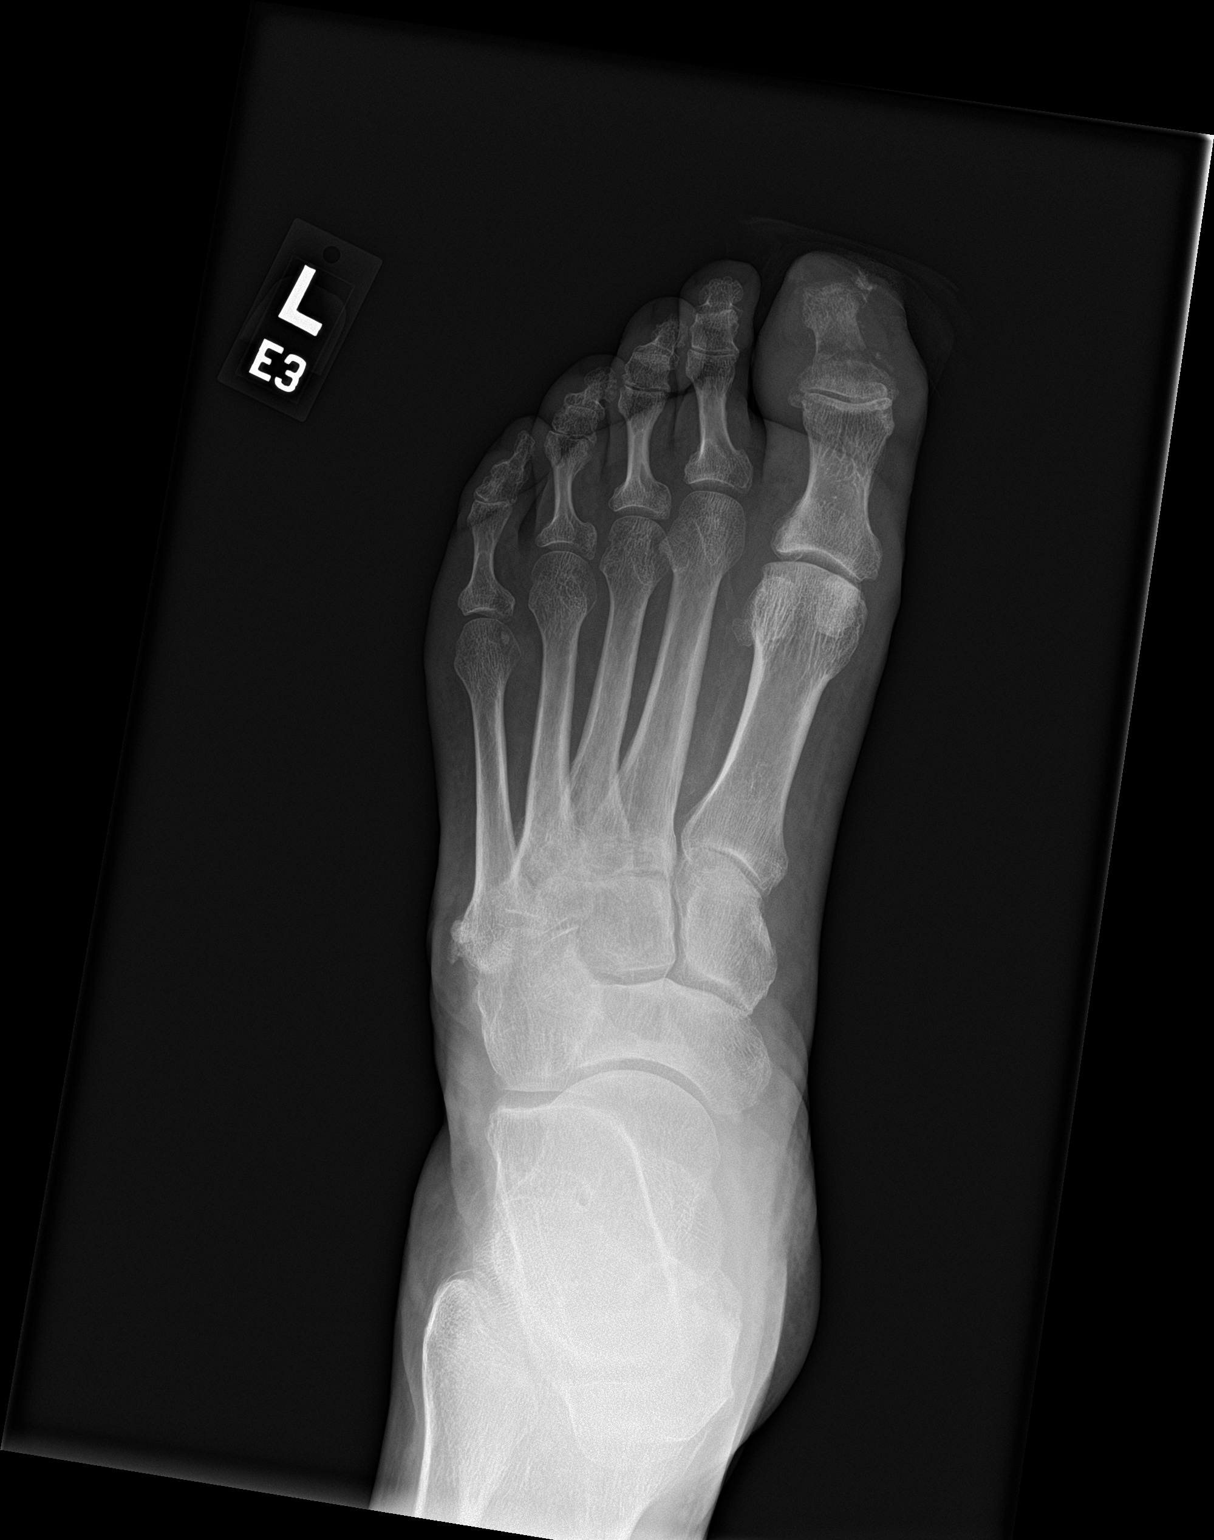

[foot lat]
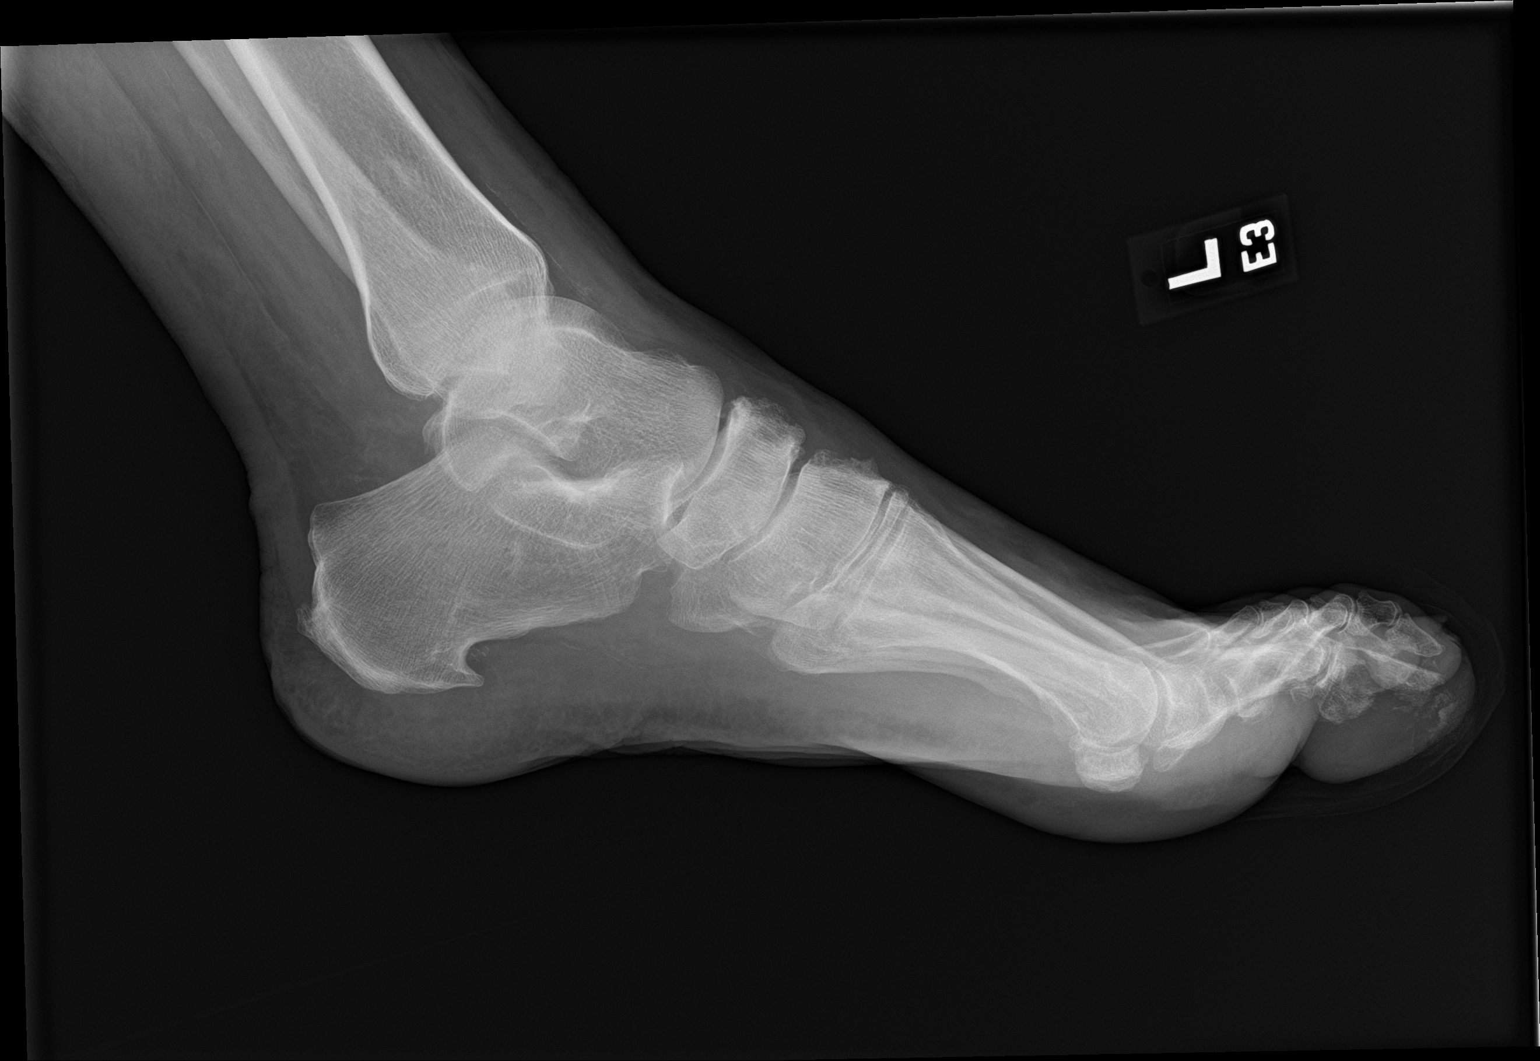

[foot obl]
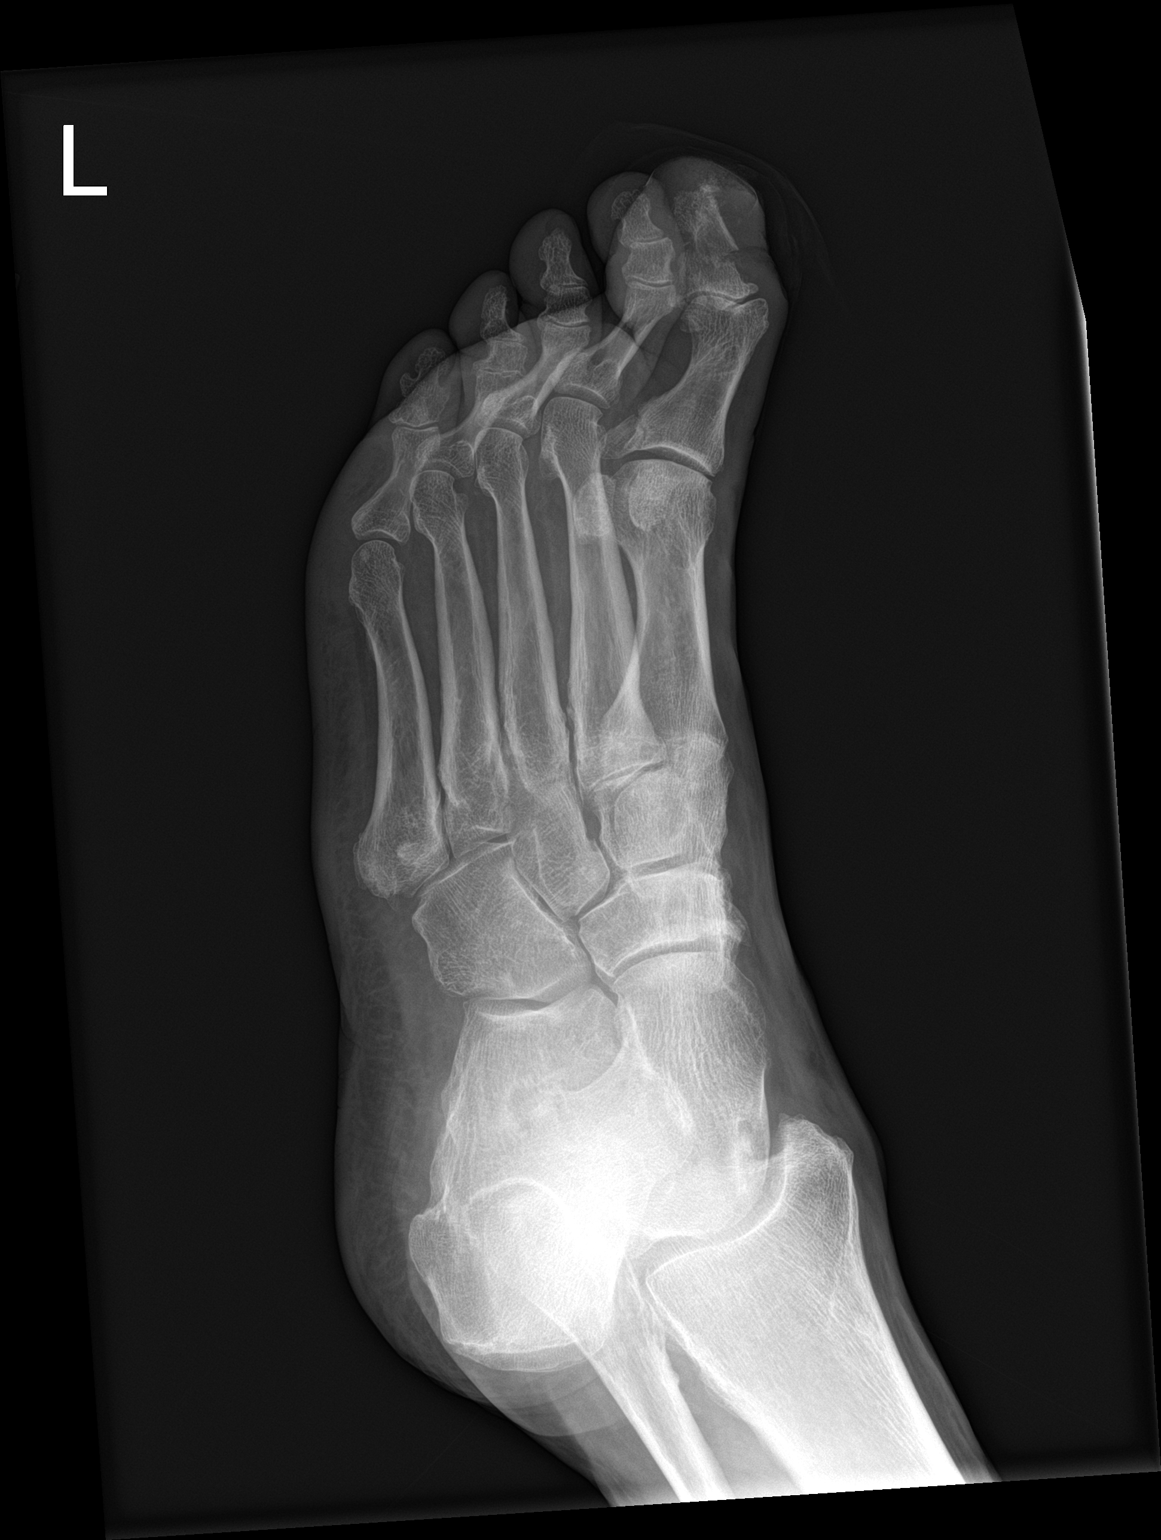

[3 of 3 positions shown; findings below may reference images not displayed]

FINDINGS: Destructive changes involving the great toe distal phalanx.
Transverse lucency consistent with fracture with mild displacement,
possibly pathologic. A dressing overlies the great toe, vague
increased density in the soft tissues subjacent to the dressing. No
other findings suspicious for osteomyelitis. Mild osteoarthritis in
the midfoot and first metatarsal phalangeal joint. Moderate plantar
calcaneal spur and Achilles tendon enthesophyte. No soft tissue gas.
IMPRESSION: 1. Destructive changes involving the great toe distal phalanx
consistent with osteomyelitis. There is a transverse fracture
through the distal phalanx which may be pathologic. Vague increased
density in the soft tissues subjacent to the dressing may represent
tissue wound or overlying artifact related to dressing.
2. Mild osteoarthritis of the midfoot and first metatarsophalangeal
joint.

## 2023-08-18 NOTE — Progress Notes (Unsigned)
   REFERRING PHYSICIAN:  Alm Bustard, [address] [place],  [place] [address]  DOS: 06/05/23 right C7-T1 microdiscectomy   HISTORY OF PRESENT ILLNESS: [name] was doing well at his last visit.   He has no significant pain. He notes weakness in right hand. He feels like the right hand is clawing and he has trouble extending the fingers. He notes dexterity issues with both hands. He still has numbness in right hand.   He has history of neuropathy and does not have any toes on left foot. He has chronic balance issues but thinks they are worse. He has known foot drop on right.    PHYSICAL EXAMINATION:  NEUROLOGICAL:  General: In no acute distress.   Awake, alert, oriented to person, place, and time.  Pupils equal round and reactive to light.  Facial tone is symmetric.    Strength: Side Biceps Triceps Deltoid Interossei Grip Wrist Ext. Wrist Flex.  R 5 4 5 3  4+ 3 3  L 5 5 5 5 5 5 5    He hold his right wrist in flexion with fingers flexed, he can extend fingers by placing on table.   Negative hoffmans bilaterally.  Reflexes are 2+ and symmetric at the biceps, triceps, brachioradialis, patella and achilles.   Incision well healed  Strength in bilateral lower extremities is 5/5, but 3/5 with right EHL/DF  He has an unsteady wide gait and takes small steps.   Imaging:  Nothing new to review.   Assessment / Plan: [name] is having more weakness in right hand. He has chronic balance issues but states they are worse. Treatment options reviewed with patient and following plan made:   - Will review with Dr. Katrinka Blazing regarding further treatment.  - May need to consider EMG and/or updated cervical MRI.  - Will call him once I discuss with Dr. Katrinka Blazing.   Advised to contact the office if any questions or concerns arise.   ADDENDUM 08/23/23:  Reviewed with Dr. Katrinka Blazing. He agrees with ordering a cervical MRI and EMG of bilateral upper and lower extremities. I tried to call  patient to let him know and left a VM to call me back. Will send him a message on MyChart as well. Once I have results back, will likely have him follow up with Dr. Katrinka Blazing.   Drake Leach PA-C Dept of Neurosurgery

## 2023-08-23 ENCOUNTER — Ambulatory Visit (INDEPENDENT_AMBULATORY_CARE_PROVIDER_SITE_OTHER): Payer: Medicare Other | Admitting: Orthopedic Surgery

## 2023-08-23 ENCOUNTER — Encounter: Payer: Self-pay | Admitting: Orthopedic Surgery

## 2023-08-23 VITALS — BP 118/82 | Ht 74.0 in | Wt 234.0 lb

## 2023-08-23 DIAGNOSIS — M5412 Radiculopathy, cervical region: Secondary | ICD-10-CM

## 2023-08-23 DIAGNOSIS — R29898 Other symptoms and signs involving the musculoskeletal system: Secondary | ICD-10-CM

## 2023-08-23 DIAGNOSIS — R2689 Other abnormalities of gait and mobility: Secondary | ICD-10-CM

## 2023-08-23 DIAGNOSIS — R531 Weakness: Secondary | ICD-10-CM

## 2023-08-23 DIAGNOSIS — Z9889 Other specified postprocedural states: Secondary | ICD-10-CM

## 2023-08-23 NOTE — Addendum Note (Signed)
Addended byDrake Leach on: 08/23/2023 06:21 PM   Modules accepted: Orders

## 2023-08-24 ENCOUNTER — Encounter: Payer: Self-pay | Admitting: Neurology

## 2023-08-25 ENCOUNTER — Other Ambulatory Visit: Payer: Self-pay

## 2023-08-25 DIAGNOSIS — R202 Paresthesia of skin: Secondary | ICD-10-CM

## 2023-08-28 ENCOUNTER — Ambulatory Visit
Admission: RE | Admit: 2023-08-28 | Discharge: 2023-08-28 | Disposition: A | Discharge status: Home / Self Care | Payer: Medicare Other | Source: Ambulatory Visit | Attending: Orthopedic Surgery | Admitting: Orthopedic Surgery

## 2023-08-28 DIAGNOSIS — M5412 Radiculopathy, cervical region: Secondary | ICD-10-CM | POA: Diagnosis present

## 2023-08-28 DIAGNOSIS — R29898 Other symptoms and signs involving the musculoskeletal system: Secondary | ICD-10-CM | POA: Insufficient documentation

## 2023-08-28 DIAGNOSIS — R2689 Other abnormalities of gait and mobility: Secondary | ICD-10-CM | POA: Insufficient documentation

## 2023-08-28 DIAGNOSIS — Z9889 Other specified postprocedural states: Secondary | ICD-10-CM | POA: Diagnosis present

## 2023-09-18 ENCOUNTER — Telehealth: Payer: Self-pay | Admitting: Orthopedic Surgery

## 2023-09-18 ENCOUNTER — Ambulatory Visit (INDEPENDENT_AMBULATORY_CARE_PROVIDER_SITE_OTHER): Payer: Medicare Other | Admitting: Neurology

## 2023-09-18 DIAGNOSIS — R202 Paresthesia of skin: Secondary | ICD-10-CM | POA: Diagnosis not present

## 2023-09-18 DIAGNOSIS — M5412 Radiculopathy, cervical region: Secondary | ICD-10-CM

## 2023-09-18 DIAGNOSIS — G629 Polyneuropathy, unspecified: Secondary | ICD-10-CM

## 2023-09-18 DIAGNOSIS — G5623 Lesion of ulnar nerve, bilateral upper limbs: Secondary | ICD-10-CM

## 2023-09-18 NOTE — Procedures (Signed)
[facility]  [address] [place], [address]  [place], [place] [address] Tel: [phone] Fax: [phone] Test Date:  09/18/2023  Patient: [name] DOB: Nov 24, 1955 Physician: Jacquelyne Balint, MD  Sex: Male Height: 6\' 2"  Ref Phys: Drake Leach PA-C  ID#: [id number]   Technician:    History: This is a 67 year old male with weakness and numbness of his hands.  NCV & EMG Findings: Extensive electrodiagnostic evaluation of bilateral upper limbs shows: Bilateral median, ulnar, and radial sensory responses are absent. Right median (APB) motor response shows prolonged distal onset latency (4.4 ms). Right ulnar (ADM) motor response shows prolonged distal onset latency (4.4 ms), reduced amplitude (2.3 mV), and decreased conduction velocity (41 m/s from below elbow to wrist stimulation site, 30 m/s from above elbow to below elbow stimulation site). Left ulnar (ADM) motor response shows reduced amplitude (4.3 mV). Left median (APB) motor response is within normal limits. Chronic motor axon loss changes WITH active denervation changes are seen in bilateral first dorsal interosseous, right flexor digitorum profundus to digits 4, 5, right extensor indicis proprius, and left abductor digiti minimi muscles. Chronic motor axon loss changes WITHOUT active denervation changes are seen in the left flexor digitorum profundus to digits 4, 5 muscle. Cervical paraspinals were deferred due to prior history of cervical spine surgery.  Impression: This is a complex study. 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 Patient has a bilateral lower limb study scheduled, which may provide further information. An overlapping active/ongoing intraspinal canal lesion (ie: motor radiculopathy) at the right C8 root or segment,  severe in degree electrically. 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 A possible right median mononeuropathy at or distal to the wrist, consistent with carpal tunnel syndrome.    ___________________________ Jacquelyne Balint, MD    Nerve Conduction Studies Motor Nerve Results    Latency Amplitude F-Lat Segment Distance CV Comment  Site (ms) Norm (mV) Norm (ms)  (cm) (m/s) Norm   Left Median (APB) Motor  Wrist 3.6  < 4.0 5.2  > 5.0        Elbow 9.9 - 5.0 -  Elbow-Wrist 32 51  > 50   Right Median (APB) Motor  Wrist *4.4  < 4.0 5.3  > 5.0        Elbow 11.3 - 5.0 -  Elbow-Wrist 34.5 50  > 50   Left Ulnar (ADM) Motor  Wrist 2.7  < 3.1 *4.3  > 7.0        Bel elbow 7.9 - 3.6 -  Bel elbow-Wrist 26 50  > 50   Ab elbow 9.9 - 3.1 -  Ab elbow-Bel elbow 10 50 -   Right Ulnar (ADM) Motor  Wrist *4.4  < 3.1 *2.3  > 7.0        Bel elbow 10.8 - 1.32 -  Bel elbow-Wrist 26 *41  > 50   Ab elbow 14.1 - 1.28 -  Ab elbow-Bel elbow 10 30 -    Sensory Sites    Neg Peak Lat Amplitude (O-P) Segment Distance Velocity  Comment  Site (ms) Norm (V) Norm  (cm) (ms)   Left Median Sensory  Wrist-Dig II *NR  < 3.8 *NR  > 10 Wrist-Dig II 13    Right Median Sensory  Wrist-Dig II *NR  < 3.8 *NR  > 10 Wrist-Dig II 13    Left Radial Sensory  Forearm-Wrist *NR  < 2.8 *NR  > 10 Forearm-Wrist 10    Right Radial Sensory  Forearm-Wrist *NR  < 2.8 *NR  > 10 Forearm-Wrist 10    Left Ulnar Sensory  Wrist-Dig V *NR  < 3.2 *NR  > 5 Wrist-Dig V 11    Right Ulnar Sensory  Wrist-Dig V *NR  < 3.2 *NR  > 5 Wrist-Dig V 11     Electromyography   Side Muscle Ins.Act Fibs Fasc Recrt Amp Dur Poly Activation Comment  Left FDI Nml *1+ Nml *3-  *1+ *1+ *1+ Nml N/A  Left Pronator teres Nml Nml Nml Nml Nml Nml Nml Nml N/A  Left ADM Nml *1+ Nml *2- *1+ *1+ Nml Nml N/A  Left FDP Nml Nml Nml *1- *1+ *1+ Nml Nml N/A  Left Deltoid Nml Nml Nml Nml Nml Nml Nml Nml N/A  Left Biceps Nml Nml Nml Nml Nml Nml Nml Nml N/A  Left Triceps Nml Nml Nml Nml Nml Nml Nml Nml N/A  Left EIP Nml Nml Nml *1- *1+ *1+ Nml Nml N/A  Right EIP Nml *1+ Nml *SMU *1+ *1+ Nml Nml N/A  Right Pronator teres Nml Nml Nml Nml Nml Nml Nml Nml N/A  Right FDP Nml *2+ Nml *2- *1+ *1+ Nml Nml N/A  Right FDI Nml *3+ Nml *None *- *- *- *None N/A  Right Triceps *1+ Nml Nml Nml Nml Nml Nml Nml N/A  Right Deltoid Nml Nml Nml Nml Nml Nml Nml Nml N/A  Right Biceps Nml Nml Nml Nml Nml Nml Nml Nml N/A      Waveforms:  Motor           Sensory

## 2023-09-18 NOTE — Telephone Encounter (Signed)
Patient scheduled for 1.24.2024

## 2023-09-18 NOTE — Telephone Encounter (Signed)
He had EMG of upper extremities today and has EMG of lower extremities scheduled on 11/25 with Dr. Loleta Chance.   Please schedule him to see Dr. Katrinka Blazing to review these studies anytime after 11/26.   Thanks!

## 2023-09-22 ENCOUNTER — Encounter: Payer: Self-pay | Admitting: Orthopedic Surgery

## 2023-09-22 NOTE — Progress Notes (Signed)
MRI of cervical spine dated 08/28/23:  FINDINGS: Alignment: Normal   Vertebrae: Normal   Cord: No focal lesion or cord compression.   Posterior Fossa, vertebral arteries, paraspinal tissues: Surgical approach on the right at C7-T1 without complicating features.   Disc levels:   Foramen magnum and C1-2 are normal.   C2-3: Facet osteoarthritis worse on the left. Moderate left foraminal narrowing.   C3-4: Spondylosis with endplate osteophytes. Bilateral facet osteoarthritis. No compressive canal stenosis. Bilateral foraminal stenosis.   C4-5: Endplate osteophytes and bulging of the disc. Facet osteoarthritis worse on the left. No compressive canal stenosis. Foraminal stenosis left worse than right.   C5-6: Endplate osteophytes and bulging of the disc. Facet osteoarthritis on the right. No canal stenosis. Moderate right foraminal stenosis.   C6-7: Minimal noncompressive degenerative changes.   C7-T1: Interval posterior approach for right sided discectomy. Postsurgical edema within the right side of the canal and intervertebral foramen on the right. No definite residual disc fragment or recurrent herniation, but distinct characterization is difficult given the recent postoperative time frame.   T1-2: Normal   IMPRESSION: 1. Interval posterior approach for right sided discectomy at C7-T1. Postsurgical edema within the right side of the canal and intervertebral foramen on the right. No definite residual disc fragment or recurrent herniation, but distinct characterization is difficult given the recent postoperative time frame. 2. C2-3: Facet osteoarthritis worse on the left. Moderate left foraminal stenosis. 3. C3-4: Spondylosis with endplate osteophytes. Bilateral facet osteoarthritis. Bilateral foraminal stenosis. 4. C4-5: Endplate osteophytes and bulging of the disc. Facet osteoarthritis worse on the left. Foraminal stenosis left worse than right. 5. C5-6: Endplate  osteophytes and bulging of the disc. Facet osteoarthritis on the right. Moderate right foraminal stenosis.     Electronically Signed   By: Paulina Fusi M.D.   On: 09/21/2023 09:49  I have personally reviewed the images and agree with the above interpretation.  MRI reviewed with Dr. Katrinka Blazing. No recurrent disc seen. He will follow up with Dr. Katrinka Blazing after his EMG next week.

## 2023-09-25 ENCOUNTER — Ambulatory Visit (INDEPENDENT_AMBULATORY_CARE_PROVIDER_SITE_OTHER): Payer: Medicare Other | Admitting: Neurology

## 2023-09-25 DIAGNOSIS — G629 Polyneuropathy, unspecified: Secondary | ICD-10-CM

## 2023-09-25 DIAGNOSIS — R202 Paresthesia of skin: Secondary | ICD-10-CM

## 2023-09-25 NOTE — Procedures (Signed)
[facility]  [address] [place], [address]  [place], [place] [address] Tel: [phone] Fax: [phone] Test Date:  09/25/2023  Patient: [name] DOB: 06/27/1956 Physician: Jacquelyne Balint, MD  Sex: Male Height: 6\' 2"  Ref Phys: Drake Leach, PA-C  ID#: [id number]   Technician:    History: This is a 67 year old male with numbness and tingling in his legs.  NCV & EMG Findings: Extensive electrodiagnostic evaluation of bilateral lower limbs shows: Bilateral sural and superficial peroneal/fibular sensory responses are absent. Bilateral peroneal/fibular (EDB) and tibial (AH) motor responses are absent. Right peroneal/fibular (TA) motor response shows mildly decreased conduction velocity (34 m/s). Left peroneal/fibular (TA) motor response is within normal limits. Bilateral H reflex responses are absent. Chronic motor axon loss changes without accompanying active denervation changes are seen in the bilateral tibialis anterior and bilateral medial head of gastrocnemius muscles. Lumbosacral paraspinal muscles were deferred due to history of lumbosacral spine surgery.  Impression: This is an abnormal study. 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 Taken with the prior EMG of bilateral upper extremities from 09/18/23, findings are severe in degree. No definitive electrodiagnostic evidence of a right or left (L3-S1) motor radiculopathy. While there is mild conduction velocity slowing of the right peroneal/fibular (TA) motor response from the popliteal fossa to fibular head stimulation sites, the reduction is not in the demyelinating range. This abnormality is of unclear clinical significance and too limited in degree and distribution for definitive diagnosis.    ___________________________ Jacquelyne Balint, MD    Nerve Conduction Studies Motor Nerve Results    Latency Amplitude F-Lat Segment Distance  CV Comment  Site (ms) Norm (mV) Norm (ms)  (cm) (m/s) Norm   Left Fibular (EDB) Motor  Ankle *NR  < 6.0 *NR  > 2.5        Bel fib head *NR - *NR -  Bel fib head-Ankle - *NR  > 40   Pop fossa *NR - *NR -  Pop fossa-Bel fib head - *NR -   Right Fibular (EDB) Motor  Ankle *NR  < 6.0 *NR  > 2.5        Bel fib head *NR - *NR -  Bel fib head-Ankle - *NR  > 40   Pop fossa *NR - *NR -  Pop fossa-Bel fib head - *NR -   Left Fibular (TA) Motor  Fib head 2.8  < 4.5 3.8  > 3.0        Pop fossa 5.3  < 6.7 3.8 -  Pop fossa-Fib head 10 40  > 40   Right Fibular (TA) Motor  Fib head 3.4  < 4.5 3.3  > 3.0        Pop fossa 6.3  < 6.7 2.00 -  Pop fossa-Fib head 10 *34  > 40   Left Tibial (AH) Motor  Ankle *NR  < 6.0 *NR  > 4.0        Knee *NR - *NR -  Knee-Ankle - *NR  > 40   Right Tibial (AH) Motor  Ankle *NR  < 6.0 *NR  > 4.0        Knee *NR - *NR -  Knee-Ankle - *NR  > 40    Sensory Sites    Neg Peak Lat Amplitude (O-P) Segment Distance Velocity Comment  Site (ms) Norm (V) Norm  (cm) (ms)   Left Superficial Fibular Sensory  14 cm-Ankle *NR  <  4.6 *NR  > 3 14 cm-Ankle 14    Right Superficial Fibular Sensory  14 cm-Ankle *NR  < 4.6 *NR  > 3 14 cm-Ankle 14    Left Sural Sensory  Calf-Lat mall *NR  < 4.6 *NR  > 3 Calf-Lat mall 14    Right Sural Sensory  Calf-Lat mall *NR  < 4.6 *NR  > 3 Calf-Lat mall 14     H-Reflex Results    M-Lat H Lat H Neg Amp H-M Lat  Site (ms) (ms) Norm (mV) (ms)  Left Tibial H-Reflex  Pop fossa 4.3 -  < 35.0 - -  Right Tibial H-Reflex  Pop fossa 9.2 -  < 35.0 - -   Electromyography   Side Muscle Ins.Act Fibs Fasc Recrt Amp Dur Poly Activation Comment  Left Tib ant Nml Nml Nml *2- *1+ *1+ *1+ Nml N/A  Left Gastroc MH Nml Nml Nml *2- *1+ *1+ Nml Nml N/A  Left Rectus fem Nml Nml Nml Nml Nml Nml Nml Nml N/A  Left Biceps fem SH Nml Nml Nml Nml Nml Nml Nml Nml N/A  Left Gluteus med Nml Nml Nml Nml Nml Nml Nml Nml N/A  Right Tib ant Nml Nml Nml *3- *1+ *1+ *1+ Nml  N/A  Right Gastroc MH Nml Nml Nml *2- *1+ *1+ Nml Nml N/A  Right Rectus fem Nml Nml Nml Nml Nml Nml Nml Nml N/A  Right Biceps fem SH Nml Nml Nml Nml Nml Nml Nml Nml N/A  Right Gluteus med Nml Nml Nml Nml Nml Nml Nml Nml N/A      Waveforms:  Motor               Sensory           H-Reflex

## 2023-09-27 NOTE — Progress Notes (Signed)
Referring Physician:  No referring provider defined for this encounter.  Primary Physician:  [name] Bustard, NP  History of Present Illness: 10/04/2023 [name] is here today with a chief complaint of worsening upper and lower extremity function and numbness, worsening ambulatory abilities.  Follow-up on EMG  He feels like he continues to have worsening ambulation as well as worsening balance as well as continued worsening of his upper and lower extremity function.  He is had an excessive workup with neurology and a following up here today with results.  Review of Systems:  A 10 point review of systems is negative, except for the pertinent positives and negatives detailed in the HPI.  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)    Depression    Diabetes mellitus without complication (HCC)    Diabetic infection of left foot (HCC)    Dysarthria    Gout    Hemiparesis affecting left side as late effect of cerebrovascular accident (CVA) (HCC)    HLD (hyperlipidemia)    Hyponatremia    Lactic acidosis    Left hallux osteomyelitis (HCC)    Osteomyelitis of ankle or foot, acute, left (HCC)    Stage 3a chronic kidney disease (HCC)    Stroke (HCC)    Type II diabetes mellitus with renal manifestations [facility])     Past Surgical History: Past Surgical History:  Procedure Laterality Date   ACHILLES TENDON SURGERY Left 08/18/2022   Procedure: ACHILLES LENGTHENING/KIDNER;  Surgeon: Candelaria Stagers, DPM;  Location: [facility];  Service: Podiatry;  Laterality: Left;   AMPUTATION TOE Left 12/16/2021   Procedure: AMPUTATION TOE - Left Great;  Surgeon: Linus Galas, DPM;  Location: [facility];  Service: Podiatry;  Laterality: Left;    AMPUTATION TOE Left 01/14/2022   Procedure: GREAT TOE AMPUTATION;  Surgeon: Linus Galas, DPM;  Location: [facility];  Service: Podiatry;  Laterality: Left;   APPENDECTOMY  1966   CATARACT EXTRACTION W/PHACO Left 05/24/2022   Procedure: CATARACT EXTRACTION PHACO AND INTRAOCULAR LENS PLACEMENT (IOC) LEFT 7.63 00:42.1;  Surgeon: Galen Manila, MD;  Location: [facility];  Service: Ophthalmology;  Laterality: Left;  Diabetic   CATARACT EXTRACTION W/PHACO Right 06/07/2022   Procedure: CATARACT EXTRACTION PHACO AND INTRAOCULAR LENS PLACEMENT (IOC) RIGHT;  Surgeon: Galen Manila, MD;  Location: [facility];  Service: Ophthalmology;  Laterality: Right;  7.33 0:45.3   COLONOSCOPY     LAPAROSCOPIC GASTRIC BANDING     POSTERIOR CERVICAL LAMINECTOMY Right 06/05/2023   Procedure: RIGHT MINIMALLY INVASIVE (MIS) C7-T1 DISCECTOMY;  Surgeon: Loreen Freud, MD;  Location: [facility];  Service: Neurosurgery;  Laterality: Right;   TRANSMETATARSAL AMPUTATION Left 08/18/2022   Procedure: TRANSMETATARSAL AMPUTATION;  Surgeon: Candelaria Stagers, DPM;  Location: [facility];  Service: Podiatry;  Laterality: Left;   WISDOM TOOTH EXTRACTION      Allergies: Allergies as of 10/04/2023   (No Known Allergies)    Medications:  Current Outpatient Medications:    allopurinol (ZYLOPRIM) 300 MG tablet, Take 300 mg by mouth daily., Disp: , Rfl:    atorvastatin (LIPITOR) 80 MG tablet, Take 1 tablet (80 mg total) by mouth daily., Disp: 90 tablet, Rfl: 0   B-D  UF III MINI PEN NEEDLES 31G X 5 MM MISC, Inject 1 each into the skin 3 (three) times daily., Disp: , Rfl:    BD DISP NEEDLES 18G X 1-1/2" MISC, , Disp: , Rfl:    BD PLASTIPAK SYRINGE 21G X 1" 3 ML MISC, , Disp: , Rfl:    BD PLASTIPAK SYRINGE 3 ML MISC, , Disp: , Rfl:    BD PRECISIONGLIDE NEEDLE 23G X 1-1/2" MISC, , Disp: , Rfl:    buPROPion (WELLBUTRIN XL) 150 MG 24 hr tablet, Take 150 mg by mouth daily., Disp: , Rfl:    Continuous Glucose Receiver  (FREESTYLE LIBRE 14 DAY READER) DEVI, Use 1 Device as directed, Disp: , Rfl:    cyanocobalamin 100 MCG tablet, Take 100 mcg by mouth daily., Disp: , Rfl:    doxycycline (VIBRAMYCIN) 100 MG capsule, Take 100 mg by mouth 2 (two) times daily., Disp: , Rfl:    empagliflozin (JARDIANCE) 25 MG TABS tablet, Take 25 mg by mouth daily., Disp: , Rfl:    gabapentin (NEURONTIN) 400 MG capsule, Take 400 mg by mouth 3 (three) times daily., Disp: , Rfl:    LANTUS SOLOSTAR 100 UNIT/ML Solostar Pen, Inject 6 Units into the skin at bedtime., Disp: , Rfl:    lisinopril (ZESTRIL) 5 MG tablet, Take 5 mg by mouth daily., Disp: , Rfl:    metFORMIN (GLUCOPHAGE) 500 MG tablet, Take 1,000 mg by mouth 2 (two) times daily with a meal., Disp: , Rfl:    methocarbamol (ROBAXIN) 500 MG tablet, Take 1 tablet (500 mg total) by mouth every 6 (six) hours as needed for muscle spasms., Disp: 120 tablet, Rfl: 0   pravastatin (PRAVACHOL) 80 MG tablet, Take 80 mg by mouth daily., Disp: , Rfl:    QUEtiapine (SEROQUEL) 25 MG tablet, Take 25 mg by mouth at bedtime., Disp: , Rfl:    rOPINIRole (REQUIP) 0.5 MG tablet, Take 0.5 mg by mouth at bedtime., Disp: , Rfl:    Semaglutide 14 MG TABS, Take 14 mg by mouth daily., Disp: , Rfl:    sildenafil (VIAGRA) 100 MG tablet, Take 100 mg by mouth daily as needed for erectile dysfunction., Disp: , Rfl:    testosterone cypionate (DEPOTESTOSTERONE CYPIONATE) 200 MG/ML injection, Inject into the muscle every 14 (fourteen) days., Disp: , Rfl:   Social History: Social History   Tobacco Use   Smoking status: Former    Current packs/day: 0.00    Types: Cigarettes    Quit date: 10/07/1997    Years since quitting: 26.0   Smokeless tobacco: Never  Vaping Use   Vaping status: Never Used  Substance Use Topics   Alcohol use: Never   Drug use: Never    Family Medical History: History reviewed. No pertinent family history.  Physical Examination: Vitals:   10/04/23 0857  BP: 118/86     General: Patient is in no apparent distress. Attention to examination is appropriate.  Neck:   Supple.  Full range of motion.  Respiratory: Patient is breathing without any difficulty.   NEUROLOGICAL:     Awake, alert, oriented to person, place, and time.  Speech is clear and fluent.   Cranial Nerves: Pupils equal round and reactive to light.  Facial tone is symmetric. Shoulder shrug is symmetric. Tongue protrusion is midline.  There is no pronator drift.  Motor Exam:  Developing diffuse motor wasting as well as some neuropathic type posturing in the hands.  Decreased hand and leg strength notable on examination.  Continues  to have steady decline.  Hyporeflexic throughout  Decreased sensation throughout  Abnormal, unsteady, wide-based.   Medical Decision Making  Electrodiagnostics:  [facility]  [address], [address]  [place], [place] [address] Tel: [phone] Fax: [phone] Test Date:  09/25/2023   Patient: [name] DOB: 1956-01-19 Physician: Jacquelyne Balint, MD  Sex: Male Height: 6\' 2"  Ref Phys: Drake Leach, PA-C  ID#: [id number]     Technician:      History: This is a 67 year old male with numbness and tingling in his legs.   NCV & EMG Findings: Extensive electrodiagnostic evaluation of bilateral lower limbs shows: Bilateral sural and superficial peroneal/fibular sensory responses are absent. Bilateral peroneal/fibular (EDB) and tibial (AH) motor responses are absent. Right peroneal/fibular (TA) motor response shows mildly decreased conduction velocity (34 m/s). Left peroneal/fibular (TA) motor response is within normal limits. Bilateral H reflex responses are absent. Chronic motor axon loss changes without accompanying active denervation changes are seen in the bilateral tibialis anterior and bilateral medial head of gastrocnemius muscles. Lumbosacral paraspinal muscles were deferred due to history of lumbosacral spine surgery.    Impression: This is an abnormal study. 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 Taken with the prior EMG of bilateral upper extremities from 09/18/23, findings are severe in degree. No definitive electrodiagnostic evidence of a right or left (L3-S1) motor radiculopathy. While there is mild conduction velocity slowing of the right peroneal/fibular (TA) motor response from the popliteal fossa to fibular head stimulation sites, the reduction is not in the demyelinating range. This abnormality is of unclear clinical significance and too limited in degree and distribution for definitive diagnosis.       ___________________________ Jacquelyne Balint, MD        [facility]  [address], [address]  [place], [place] [address] Tel: [phone] Fax: [phone] Test Date:  09/18/2023   Patient: [name] DOB: 1956/02/21 Physician: Jacquelyne Balint, MD  Sex: Male Height: 6\' 2"  Ref Phys: Drake Leach PA-C  ID#: [id number]     Technician:      History: This is a 67 year old male with weakness and numbness of his hands.   NCV & EMG Findings: Extensive electrodiagnostic evaluation of bilateral upper limbs shows: Bilateral median, ulnar, and radial sensory responses are absent. Right median (APB) motor response shows prolonged distal onset latency (4.4 ms). Right ulnar (ADM) motor response shows prolonged distal onset latency (4.4 ms), reduced amplitude (2.3 mV), and decreased conduction velocity (41 m/s from below elbow to wrist stimulation site, 30 m/s from above elbow to below elbow stimulation site). Left ulnar (ADM) motor response shows reduced amplitude (4.3 mV). Left median (APB) motor response is within normal limits. Chronic motor axon loss changes WITH active denervation changes are seen in bilateral first dorsal interosseous, right flexor digitorum profundus to digits 4, 5, right extensor indicis  proprius, and left abductor digiti minimi muscles. Chronic motor axon loss changes WITHOUT active denervation changes are seen in the left flexor digitorum profundus to digits 4, 5 muscle. Cervical paraspinals were deferred due to prior history of cervical spine surgery.   Impression: This is a complex study. 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 Patient has a bilateral lower limb study scheduled, which may provide further  information. An overlapping active/ongoing intraspinal canal lesion (ie: motor radiculopathy) at the right C8 root or segment, severe in degree electrically. 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 A possible right median mononeuropathy at or distal to the wrist, consistent with carpal tunnel syndrome.       ___________________________ Jacquelyne Balint, MD    I have personally reviewed the images and electrodiagnostics and agree with the above interpretation.  Assessment and Plan: Mr. Prinkey is a pleasant 67 y.o. 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  He has been continuing to have worsening symptoms of weakness in his upper and lower extremities as well as decreased function in his hands and decreased balance.  He states that his walking continues to require a hand assist generally against a wall or nearby furniture.  His workup was recently completed demonstrated his known cervical spine issues as well as some lumbar spine issues but also a very  severe axonal sensorimotor polyneuropathy.  He does have a history of longstanding diabetes, is seeing his PCP this month.  Been using his glucose monitor.  Will reach out to Dr. Loleta Chance and inquire whether or not he should be seen formally for his neuropathy or if this could be managed more on the primary care level.  Will also get him occupational therapy for his upper extremities.  His glucose management is currently being undertaken by his PCP.  We did discuss that he has some evidence of compressive neuropathies at the elbows that could be worsening his hand function, however he is not interested in going through with any further surgery given the risks with diabetes and peripheral nerve intervention.  Thank you for involving me in the care of this patient.   Spent 30 minutes with the patient reviewing his chart, face-to-face discussion, clinical evaluation, and interpretation of his results and discussion of his results with him directly.  Lovenia Kim MD/MSCR Neurosurgery - Peripheral Nerve Surgery

## 2023-10-04 ENCOUNTER — Encounter: Payer: Self-pay | Admitting: Neurosurgery

## 2023-10-04 ENCOUNTER — Ambulatory Visit: Payer: Medicare Other | Admitting: Neurosurgery

## 2023-10-04 VITALS — BP 118/86 | Ht 74.0 in | Wt 234.0 lb

## 2023-10-04 DIAGNOSIS — G5623 Lesion of ulnar nerve, bilateral upper limbs: Secondary | ICD-10-CM | POA: Diagnosis not present

## 2023-10-04 DIAGNOSIS — M5010 Cervical disc disorder with radiculopathy, unspecified cervical region: Secondary | ICD-10-CM | POA: Diagnosis not present

## 2023-10-04 DIAGNOSIS — G629 Polyneuropathy, unspecified: Secondary | ICD-10-CM

## 2023-10-09 ENCOUNTER — Other Ambulatory Visit: Payer: Self-pay | Admitting: Neurosurgery

## 2023-10-09 ENCOUNTER — Ambulatory Visit: Payer: Medicare Other | Attending: Neurosurgery

## 2023-10-09 DIAGNOSIS — G629 Polyneuropathy, unspecified: Secondary | ICD-10-CM | POA: Insufficient documentation

## 2023-10-09 DIAGNOSIS — M6281 Muscle weakness (generalized): Secondary | ICD-10-CM | POA: Diagnosis present

## 2023-10-09 DIAGNOSIS — G5623 Lesion of ulnar nerve, bilateral upper limbs: Secondary | ICD-10-CM | POA: Diagnosis not present

## 2023-10-09 DIAGNOSIS — R29898 Other symptoms and signs involving the musculoskeletal system: Secondary | ICD-10-CM

## 2023-10-09 DIAGNOSIS — M5412 Radiculopathy, cervical region: Secondary | ICD-10-CM | POA: Diagnosis present

## 2023-10-09 DIAGNOSIS — M5010 Cervical disc disorder with radiculopathy, unspecified cervical region: Secondary | ICD-10-CM | POA: Diagnosis not present

## 2023-10-09 DIAGNOSIS — R278 Other lack of coordination: Secondary | ICD-10-CM | POA: Diagnosis present

## 2023-10-09 NOTE — Therapy (Unsigned)
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)    Depression    Diabetes mellitus without complication (HCC)    Diabetic infection of left foot (HCC)    Dysarthria    Gout    Hemiparesis affecting left side as late effect of cerebrovascular accident (CVA) (HCC)    HLD (hyperlipidemia)    Hyponatremia    Lactic acidosis    Left hallux osteomyelitis (HCC)    Osteomyelitis of ankle or foot, acute, left (HCC)    Stage 3a chronic kidney disease (HCC)    Stroke (HCC)    Type II diabetes mellitus with renal manifestations [facility])    Past Surgical History:  Procedure Laterality Date   ACHILLES TENDON SURGERY Left 08/18/2022   Procedure: ACHILLES LENGTHENING/KIDNER;  Surgeon: Candelaria Stagers, DPM;  Location: [facility];  Service: Podiatry;  Laterality: Left;   AMPUTATION TOE Left 12/16/2021   Procedure: AMPUTATION TOE - Left Great;  Surgeon: Linus Galas, DPM;  Location: [facility];  Service: Podiatry;  Laterality: Left;   AMPUTATION TOE Left 01/14/2022   Procedure: GREAT TOE AMPUTATION;  Surgeon: Linus Galas, DPM;  Location: [facility];  Service: Podiatry;  Laterality: Left;   APPENDECTOMY  1966   CATARACT EXTRACTION W/PHACO Left 05/24/2022   Procedure: CATARACT EXTRACTION PHACO AND INTRAOCULAR LENS PLACEMENT (IOC) LEFT 7.63 00:42.1;  Surgeon: Galen Manila, MD;  Location: [facility];  Service: Ophthalmology;  Laterality: Left;  Diabetic   CATARACT EXTRACTION W/PHACO Right 06/07/2022   Procedure: CATARACT EXTRACTION PHACO  AND INTRAOCULAR LENS PLACEMENT (IOC) RIGHT;  Surgeon: Galen Manila, MD;  Location: [facility];  Service: Ophthalmology;  Laterality: Right;  7.33 0:45.3   COLONOSCOPY     LAPAROSCOPIC GASTRIC BANDING     POSTERIOR CERVICAL LAMINECTOMY Right 06/05/2023   Procedure: RIGHT MINIMALLY INVASIVE (MIS) C7-T1 DISCECTOMY;  Surgeon: Loreen Freud, MD;  Location: [facility];  Service: Neurosurgery;  Laterality: Right;   TRANSMETATARSAL AMPUTATION Left 08/18/2022   Procedure: TRANSMETATARSAL AMPUTATION;  Surgeon: Candelaria Stagers, DPM;  Location: [facility];  Service: Podiatry;  Laterality: Left;   WISDOM TOOTH EXTRACTION     Patient Active Problem List   Diagnosis Date Noted   Radiculopathy, cervical 06/05/2023   Cervical radiculopathy 06/05/2023   Diabetic infection of left foot (HCC) 08/17/2022   HLD (hyperlipidemia) 08/17/2022   Chronic diastolic CHF (congestive heart failure) (HCC) 08/17/2022   Type II diabetes mellitus with renal manifestations (HCC) 08/17/2022   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)    Cellulitis of great toe of left foot 01/12/2022   Hemiparesis affecting left side as late effect of cerebrovascular accident (CVA) (HCC) 01/12/2022   Left hallux osteomyelitis (HCC) 01/12/2022   Stage 3a chronic kidney disease (HCC) 01/12/2022   Depression 01/12/2022   Osteomyelitis of ankle or foot, acute, left (HCC) 12/16/2021   Gout    Lactic acidosis    CVA (cerebral vascular accident) (HCC) 12/06/2021   Diabetes mellitus type 2, uncomplicated (HCC) 12/06/2021   AKI (acute kidney  injury) (HCC) 12/06/2021   Hyponatremia 12/06/2021   Dysarthria 12/06/2021   ONSET DATE: 10/  REFERRING DIAG: ***  THERAPY DIAG:  No diagnosis found.  Rationale for Evaluation and Treatment: {HABREHAB:27488}  SUBJECTIVE: arm started to go numb and affect movement of the r arm before surgery,   SUBJECTIVE STATEMENT: Cubital tunnel noticeable before surgery, don't   Pt accompanied by: {accompnied:27141}  PERTINENT HISTORY: R arm got weak about a weak before surgery    PRECAUTIONS: Fall, "take it easy, no excessive bending, lifting, twisting."   WEIGHT BEARING RESTRICTIONS: {Yes ***/No:24003}  PAIN:  Are you having pain?   FALLS: Has patient fallen in last 6 months? No  LIVING ENVIRONMENT: Lives with: lives alone with dog Lives in: 1 level home Stairs: Yes: External: 1 steps; on right going up, on left going up, and can reach both Has following equipment at home:  wc, rollator, SPC, grab bars in tub shower, standard height toilet  PLOF: Independent  PATIENT GOALS: "Increase use of my hand."  OBJECTIVE:  Note: Objective measures were completed at Evaluation unless otherwise noted.  HAND DOMINANCE: Right  ADLs: Overall ADLs: in general pt reports he uses L hand 90% of the time over the R  Transfers/ambulation related to ADLs: using rollator at home but guides with L hand while R hand rests; R hand unable to grip brake  Eating: using L non-dominant hand to eat; unable to grip a fork or a glass with R hand, must use 2 hands to drink from a cup Grooming: some difficulty manipulating toothbrush in R hand, goes to the barber to shave  UB Dressing: difficulty with clothing fasteners; keeps shirts buttoned and dons them overhead  LB Dressing: keeps shoes tied and wears as slip on; difficulty with clothing fasteners on pants  Toileting: using L hand for toilet hygiene Bathing: has had falls in the shower; transfer tub bench recommended  Tub Shower transfers: *** Equipment: {equipment:25573}  IADLs: Shopping: motorized cart  Light housekeeping: has housekeeper  Meal Prep: sandwhiches, does not use oven or stove, uses [employer] mobility: ambulatory only in the home, furniture walking or wall walking or rollator  Medication management: indep with a pill box  Financial management: indep Handwriting: 100% legible, Increased time, and  below baseline status  MOBILITY STATUS: {OTMOBILITY:25360}  POSTURE COMMENTS:  {posture:25561} Sitting balance: {sitting balance:25483}  ACTIVITY TOLERANCE: Activity tolerance: ***  FUNCTIONAL OUTCOME MEASURES: FOTO: xx; predicted xx  UPPER EXTREMITY ROM:    Active ROM Right eval Left eval  Shoulder flexion 126 (130) 125 (135)  Shoulder abduction 150 150  Shoulder adduction    Shoulder extension    Shoulder internal rotation    Shoulder external rotation    Elbow flexion  5  Elbow extension  5  Wrist flexion  5  Wrist extension  4+  Wrist ulnar deviation    Wrist radial deviation    Wrist pronation    Wrist supination    (Blank rows = not tested)  Lacking ~50 degrees MP ext of R 4th digit Lacking ~25 degrees of MP ex of R 5th digit   UPPER EXTREMITY MMT:     MMT Right eval Left eval  Shoulder flexion 3+ 5  Shoulder abduction 4- 5  Shoulder adduction    Shoulder extension    Shoulder internal rotation 4- 5  Shoulder external rotation 3+ 5  Middle trapezius    Lower trapezius    Elbow flexion 4- 5  Elbow extension 4  5  Wrist flexion 3+ 5  Wrist extension 3- 4+  Wrist ulnar deviation    Wrist radial deviation    Wrist pronation 3+ 4+  Wrist supination 3+ 4+  (Blank rows = not tested)  HAND FUNCTION: Grip strength: Right: 11 lbs; Left: 47 lbs, Lateral pinch: Right: 0 lbs, Left: 8 lbs, and 3 point pinch: Right: 0 lbs, Left: 15 lbs  COORDINATION:  Finger to nose: slow but accurate on the R, mildly ataxic on the L  9 Hole Peg test: Right: 58 sec; Left: 42 sec Able to oppose each digit to thumb with difficulty and extra time on the R hand, L hand able but mildly ataxic   SENSATION: WFL  EDEMA: ***  MUSCLE TONE: {UETONE:25567}  COGNITION: Overall cognitive status: {cognition:24006}  VISION: Hx of cataracts removed both eyes last year; wears reading glasses, but mostly all the time   VISION ASSESSMENT: {visionassessment:27231}  Patient has  difficulty with following activities due to following visual impairments: ***  PERCEPTION: {Perception:25564}  PRAXIS: {Praxis:25565}  OBSERVATIONS: ***   TODAY'S TREATMENT:                                                                                                                              DATE: 10/09/23:  Evaluation completed.  Self Care: Advised on AE-transfer tub bench, set up    PATIENT EDUCATION: Education details: OT role, goals, poc, AE recommendations to reduce falls in shower  Person educated: Patient Education method: Explanation Education comprehension: verbalized understanding  HOME EXERCISE PROGRAM: ***   GOALS: Goals reviewed with patient? {yes/no:20286}  SHORT TERM GOALS: Target date: ***  *** Baseline: Goal status: INITIAL  2.  *** Baseline:  Goal status: INITIAL  3.  *** Baseline:  Goal status: INITIAL  4.  *** Baseline:  Goal status: INITIAL  5.  *** Baseline:  Goal status: INITIAL  6.  *** Baseline:  Goal status: INITIAL  LONG TERM GOALS: Target date: ***  *** Baseline:  Goal status: INITIAL  2.  *** Baseline:  Goal status: INITIAL  3.  *** Baseline:  Goal status: INITIAL  4.  *** Baseline:  Goal status: INITIAL  5.  *** Baseline:  Goal status: INITIAL  6.  *** Baseline:  Goal status: INITIAL  ASSESSMENT:  CLINICAL IMPRESSION: Patient is a *** y.o. *** who was seen today for occupational therapy evaluation for ***.   PERFORMANCE DEFICITS: in functional skills including {OT physical skills:25468}, cognitive skills including {OT cognitive skills:25469}, and psychosocial skills including {OT psychosocial skills:25470}.   IMPAIRMENTS: are limiting patient from {OT performance deficits:25471}.   CO-MORBIDITIES: {Comorbidities:25485} that affects occupational performance. Patient will benefit from skilled OT to address above impairments and improve overall function.  MODIFICATION OR ASSISTANCE TO COMPLETE  EVALUATION: {OT modification:25474}  OT OCCUPATIONAL PROFILE AND HISTORY: {OT PROFILE AND HISTORY:25484}  CLINICAL DECISION MAKING: {OT CDM:25475}  REHAB POTENTIAL: {rehabpotential:25112}  EVALUATION COMPLEXITY: {Evaluation complexity:25115}    PLAN:  OT FREQUENCY: {rehab frequency:25116}  OT DURATION: {rehab duration:25117}  PLANNED INTERVENTIONS: {OT Interventions:25467}  RECOMMENDED OTHER SERVICES: ***  CONSULTED AND AGREED WITH PLAN OF CARE: {NWG:95621}  PLAN FOR NEXT SESSION: ***   Otis Dials, OT 10/09/2023, 10:24 AM

## 2023-10-12 ENCOUNTER — Ambulatory Visit: Payer: Medicare Other | Admitting: Physical Therapy

## 2023-10-12 ENCOUNTER — Ambulatory Visit: Payer: Medicare Other

## 2023-10-12 ENCOUNTER — Telehealth: Payer: Self-pay

## 2023-10-12 NOTE — Therapy (Incomplete)
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)    Depression    Diabetes mellitus without complication (HCC)    Diabetic infection of left foot (HCC)    Dysarthria    Gout    Hemiparesis affecting left side as late effect of cerebrovascular accident (CVA) (HCC)    HLD (hyperlipidemia)    Hyponatremia    Lactic acidosis    Left hallux osteomyelitis (HCC)    Osteomyelitis of ankle or foot, acute, left (HCC)    Stage 3a chronic kidney disease (HCC)    Stroke (HCC)    Type II diabetes mellitus with renal manifestations [facility])    Past Surgical History:  Procedure Laterality Date   ACHILLES TENDON SURGERY Left 08/18/2022   Procedure: ACHILLES LENGTHENING/KIDNER;  Surgeon: Candelaria Stagers, DPM;  Location: [facility];  Service: Podiatry;  Laterality: Left;   AMPUTATION TOE Left 12/16/2021   Procedure: AMPUTATION TOE - Left Great;  Surgeon: Linus Galas, DPM;  Location: [facility];  Service: Podiatry;  Laterality: Left;   AMPUTATION TOE Left 01/14/2022   Procedure: GREAT TOE AMPUTATION;  Surgeon: Linus Galas, DPM;  Location: [facility];  Service: Podiatry;  Laterality: Left;   APPENDECTOMY  1966   CATARACT EXTRACTION W/PHACO Left 05/24/2022   Procedure: CATARACT EXTRACTION PHACO AND INTRAOCULAR LENS PLACEMENT (IOC) LEFT 7.63 00:42.1;  Surgeon: Galen Manila, MD;  Location: [facility];  Service: Ophthalmology;  Laterality: Left;  Diabetic   CATARACT EXTRACTION W/PHACO Right 06/07/2022   Procedure: CATARACT EXTRACTION PHACO AND INTRAOCULAR LENS PLACEMENT (IOC) RIGHT;  Surgeon: Galen Manila, MD;  Location: [facility];  Service: Ophthalmology;  Laterality: Right;  7.33 0:45.3   COLONOSCOPY     LAPAROSCOPIC GASTRIC BANDING     POSTERIOR CERVICAL LAMINECTOMY Right 06/05/2023   Procedure: RIGHT MINIMALLY INVASIVE (MIS) C7-T1 DISCECTOMY;  Surgeon: Loreen Freud, MD;  Location: [facility];  Service: Neurosurgery;  Laterality: Right;   TRANSMETATARSAL AMPUTATION Left 08/18/2022   Procedure: TRANSMETATARSAL AMPUTATION;  Surgeon: Candelaria Stagers, DPM;  Location: [facility];  Service: Podiatry;  Laterality: Left;   WISDOM TOOTH EXTRACTION     Patient Active Problem List   Diagnosis Date Noted   Radiculopathy, cervical 06/05/2023   Cervical radiculopathy 06/05/2023   Diabetic infection of left foot (HCC) 08/17/2022   HLD (hyperlipidemia) 08/17/2022   Chronic diastolic CHF (congestive heart failure) (HCC) 08/17/2022   Type II diabetes mellitus with renal manifestations (HCC) 08/17/2022   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)    Cellulitis of great toe of left foot 01/12/2022   Hemiparesis affecting left side as late effect of cerebrovascular accident (CVA) (HCC) 01/12/2022   Left hallux osteomyelitis (HCC) 01/12/2022   Stage 3a chronic kidney disease (HCC) 01/12/2022   Depression 01/12/2022   Osteomyelitis of ankle or foot, acute, left (HCC) 12/16/2021   Gout    Lactic acidosis    CVA (cerebral vascular accident) (HCC) 12/06/2021   Diabetes mellitus type 2, uncomplicated (HCC) 12/06/2021   AKI (acute kidney injury) (HCC) 12/06/2021   Hyponatremia 12/06/2021   Dysarthria  12/06/2021    PCP: Alm Bustard, NP  REFERRING PROVIDER: Alm Bustard, NP  REFERRING DIAG:  G62.9 (ICD-10-CM) - Large fiber neuropathy M50.10 (ICD-10-CM) - Cervical disc disorder with radiculopathy of cervical region R29.898 (ICD-10-CM) - Right hand weakness  THERAPY DIAG:  No diagnosis found.  Rationale for Evaluation and Treatment:  Rehabilitation  ONSET DATE: ***  SUBJECTIVE:                                                                                                                                                                                                         SUBJECTIVE STATEMENT: *** Hand dominance: {MISC; OT HAND DOMINANCE:[phone]}  PERTINENT HISTORY:  ***  PAIN:  Are you having pain? {OPRCPAIN:27236}  PRECAUTIONS: {Therapy precautions:24002}  RED FLAGS: {PT Red Flags:29287}     WEIGHT BEARING RESTRICTIONS: {Yes ***/No:24003}  FALLS:  Has patient fallen in last 6 months? {fallsyesno:27318}  LIVING ENVIRONMENT: Lives with: {OPRC lives with:25569::"lives with their family"} Lives in: {Lives in:25570} Stairs: {opstairs:27293} Has following equipment at home: {Assistive devices:23999}  OCCUPATION: ***  PLOF: {PLOF:24004}  PATIENT GOALS: ***  NEXT MD VISIT: ***  OBJECTIVE:  Note: Objective measures were completed at Evaluation unless otherwise noted.  DIAGNOSTIC FINDINGS:  ***  PATIENT SURVEYS:  {rehab surveys:24030}  COGNITION: Overall cognitive status: {cognition:24006}  SENSATION: {sensation:27233}  POSTURE: {posture:25561}  PALPATION: ***   CERVICAL ROM:   {AROM/PROM:27142} ROM A/PROM (deg) eval  Flexion   Extension   Right lateral flexion   Left lateral flexion   Right rotation   Left rotation    (Blank rows = not tested)  UPPER EXTREMITY ROM:  {AROM/PROM:27142} ROM Right eval Left eval  Shoulder flexion    Shoulder extension    Shoulder abduction    Shoulder adduction    Shoulder extension    Shoulder internal rotation    Shoulder external rotation    Elbow flexion    Elbow extension    Wrist flexion    Wrist extension    Wrist ulnar deviation    Wrist radial deviation    Wrist pronation    Wrist supination     (Blank rows = not tested)  UPPER EXTREMITY MMT:  MMT Right eval Left eval  Shoulder flexion    Shoulder extension     Shoulder abduction    Shoulder adduction    Shoulder extension    Shoulder internal rotation    Shoulder external rotation    Middle trapezius    Lower trapezius    Elbow flexion    Elbow extension  Wrist flexion    Wrist extension    Wrist ulnar deviation    Wrist radial deviation    Wrist pronation    Wrist supination    Grip strength     (Blank rows = not tested)  CERVICAL SPECIAL TESTS:  {Cervical special tests:25246}  FUNCTIONAL TESTS:  {Functional tests:24029}  TODAY'S TREATMENT: DATE: 10/12/23  Eval Only    PATIENT EDUCATION:  Education details: *** Person educated: {Person educated:25204} Education method: {Education Method:25205} Education comprehension: {Education Comprehension:25206}  HOME EXERCISE PROGRAM: ***  ASSESSMENT:  CLINICAL IMPRESSION: Patient is a *** y.o. *** who was seen today for physical therapy evaluation and treatment for ***.   OBJECTIVE IMPAIRMENTS: {opptimpairments:25111}.   ACTIVITY LIMITATIONS: {activitylimitations:27494}  PARTICIPATION LIMITATIONS: {participationrestrictions:25113}  PERSONAL FACTORS: {Personal factors:25162} are also affecting patient's functional outcome.   REHAB POTENTIAL: {rehabpotential:25112}  CLINICAL DECISION MAKING: {clinical decision making:25114}  EVALUATION COMPLEXITY: {Evaluation complexity:25115}   GOALS: Goals reviewed with patient? {yes/no:20286}  SHORT TERM GOALS: Target date: ***  *** Baseline:  Goal status: INITIAL  2.  *** Baseline:  Goal status: INITIAL  3.  *** Baseline:  Goal status: INITIAL  4.  *** Baseline:  Goal status: INITIAL  5.  *** Baseline:  Goal status: INITIAL  6.  *** Baseline:  Goal status: INITIAL  LONG TERM GOALS: Target date: ***  *** Baseline:  Goal status: INITIAL  2.  *** Baseline:  Goal status: INITIAL  3.  *** Baseline:  Goal status: INITIAL  4.  *** Baseline:  Goal status: INITIAL  5.  *** Baseline:  Goal  status: INITIAL  6.  *** Baseline:  Goal status: INITIAL   PLAN:  PT FREQUENCY: {rehab frequency:25116}  PT DURATION: {rehab duration:25117}  PLANNED INTERVENTIONS: {rehab planned interventions:25118::"97110-Therapeutic exercises","97530- Therapeutic [phone]- Neuromuscular re-education","97535- Self BMWU","13244- Manual therapy"}  PLAN FOR NEXT SESSION: ***   Phineas Real, PT 10/12/2023, 7:50 AM

## 2023-10-12 NOTE — Telephone Encounter (Signed)
OT called pt to notify him of his missed OT tx and PT eval this morning. Notified pt to call and reschedule PT eval, reminded pt to call if unable to make his appointments, and notified pt of date and time of his next OT appt.   Danelle Earthly, MS, OTR/L

## 2023-10-17 ENCOUNTER — Ambulatory Visit: Payer: Medicare Other

## 2023-10-17 DIAGNOSIS — M6281 Muscle weakness (generalized): Secondary | ICD-10-CM | POA: Diagnosis not present

## 2023-10-17 DIAGNOSIS — M5412 Radiculopathy, cervical region: Secondary | ICD-10-CM

## 2023-10-17 DIAGNOSIS — R278 Other lack of coordination: Secondary | ICD-10-CM

## 2023-10-17 NOTE — Therapy (Unsigned)
OUTPATIENT OCCUPATIONAL THERAPY NEURO TREATMENT NOTE  Patient Name: [name] MRN: [id number] DOB:12/02/1955, 67 y.o., male Today's Date: 10/18/2023  PCP: Manson Allan, NP REFERRING PROVIDER: Lovenia Kim, MD  END OF SESSION:  OT End of Session - 10/17/23 7829     Visit Number 2    Number of Visits 24    Date for OT Re-Evaluation 01/01/24    Progress Note Due on Visit 10    OT Start Time 0800    OT Stop Time 0845    OT Time Calculation (min) 45 min    Equipment Utilized During Treatment transport chair    Activity Tolerance Patient tolerated treatment well    Behavior During Therapy WFL for tasks assessed/performed            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)    Depression    Diabetes mellitus without complication (HCC)    Diabetic infection of left foot (HCC)    Dysarthria    Gout    Hemiparesis affecting left side as late effect of cerebrovascular accident (CVA) (HCC)    HLD (hyperlipidemia)    Hyponatremia    Lactic acidosis    Left hallux osteomyelitis (HCC)    Osteomyelitis of ankle or foot, acute, left (HCC)    Stage 3a chronic kidney disease (HCC)    Stroke (HCC)    Type II diabetes mellitus with renal manifestations [facility])    Past Surgical History:  Procedure Laterality Date   ACHILLES TENDON SURGERY Left 08/18/2022   Procedure: ACHILLES LENGTHENING/KIDNER;  Surgeon: Candelaria Stagers, DPM;  Location: [facility];  Service: Podiatry;  Laterality: Left;   AMPUTATION TOE Left 12/16/2021   Procedure: AMPUTATION TOE - Left Great;  Surgeon: Linus Galas, DPM;  Location: [facility];  Service: Podiatry;  Laterality: Left;   AMPUTATION TOE Left 01/14/2022   Procedure: GREAT TOE AMPUTATION;  Surgeon: Linus Galas, DPM;   Location: [facility];  Service: Podiatry;  Laterality: Left;   APPENDECTOMY  1966   CATARACT EXTRACTION W/PHACO Left 05/24/2022   Procedure: CATARACT EXTRACTION PHACO AND INTRAOCULAR LENS PLACEMENT (IOC) LEFT 7.63 00:42.1;  Surgeon: Galen Manila, MD;  Location: [facility];  Service: Ophthalmology;  Laterality: Left;  Diabetic   CATARACT EXTRACTION W/PHACO Right 06/07/2022   Procedure: CATARACT EXTRACTION PHACO AND INTRAOCULAR LENS PLACEMENT (IOC) RIGHT;  Surgeon: Galen Manila, MD;  Location: [facility];  Service: Ophthalmology;  Laterality: Right;  7.33 0:45.3   COLONOSCOPY     LAPAROSCOPIC GASTRIC BANDING     POSTERIOR CERVICAL LAMINECTOMY Right 06/05/2023   Procedure: RIGHT MINIMALLY INVASIVE (MIS) C7-T1 DISCECTOMY;  Surgeon: Loreen Freud, MD;  Location: [facility];  Service: Neurosurgery;  Laterality: Right;   TRANSMETATARSAL AMPUTATION Left 08/18/2022   Procedure: TRANSMETATARSAL AMPUTATION;  Surgeon: Candelaria Stagers, DPM;  Location: [facility];  Service: Podiatry;  Laterality: Left;   WISDOM TOOTH EXTRACTION     Patient Active Problem List   Diagnosis Date Noted   Radiculopathy, cervical 06/05/2023   Cervical radiculopathy 06/05/2023   Diabetic infection of left foot (HCC) 08/17/2022   HLD (hyperlipidemia) 08/17/2022   Chronic diastolic CHF (congestive heart failure) (HCC) 08/17/2022   Type II diabetes mellitus with renal manifestations (HCC)  08/17/2022   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)    Cellulitis of great toe of left foot 01/12/2022   Hemiparesis affecting left side as late effect of cerebrovascular accident (CVA) (HCC) 01/12/2022   Left hallux osteomyelitis (HCC) 01/12/2022   Stage 3a chronic kidney disease (HCC) 01/12/2022   Depression 01/12/2022   Osteomyelitis of ankle or foot, acute, left (HCC) 12/16/2021   Gout    Lactic acidosis    CVA (cerebral vascular accident) (HCC) 12/06/2021   Diabetes mellitus type 2,  uncomplicated (HCC) 12/06/2021   AKI (acute kidney injury) (HCC) 12/06/2021   Hyponatremia 12/06/2021   Dysarthria 12/06/2021   ONSET DATE: 06/05/23   REFERRING DIAG:  G62.9 (ICD-10-CM) - Large fiber neuropathy  M50.10 (ICD-10-CM) - Cervical disc disorder with radiculopathy of cervical region  G56.23 (ICD-10-CM) - Ulnar neuropathy of both upper extremities   THERAPY DIAG:  Muscle weakness (generalized)  Other lack of coordination  Radiculopathy, cervical region  Rationale for Evaluation and Treatment: Rehabilitation  SUBJECTIVE: SUBJECTIVE STATEMENT: Pt reports leaving a message last week to notify clinic of having to miss his OT appt d/t not feeling well.  Pt reports feeling better today. Pt accompanied by: self  PERTINENT HISTORY:  Per medical record, pt underwent microdiscectomy on 06/05/23 for C7/TI.  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  Per medical record from Dr. Ernestine Mcmurray on 09/04/23(neurosurgeon): [name] is a pleasant 67 y.o. 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  He has been continuing to have worsening symptoms of weakness in his upper and lower extremities as well as decreased function in his hands and decreased balance.  He states that his walking continues to require a hand assist generally against a wall or nearby furniture.  His workup was recently completed demonstrated his known cervical spine issues as well as some lumbar spine issues but also a very severe axonal sensorimotor polyneuropathy.  He does have a history of longstanding diabetes, is seeing his PCP this month.  Been using his glucose monitor.  Will reach out to Dr. Loleta Chance and inquire whether or not he should be seen formally for his neuropathy or if this could be managed more on the primary care level.  Will also get him occupational therapy for his upper extremities.  His  glucose management is currently being undertaken by his PCP.  We did discuss that he has some evidence of compressive neuropathies at the elbows that could be worsening his hand function, however he is not interested in going through with any further surgery given the risks with diabetes and peripheral nerve intervention.   PRECAUTIONS: Fall; No excessive bending, lifting, twisting   WEIGHT BEARING RESTRICTIONS: No  PAIN:  Are you having pain? 5/10 pain in bilat feet.  Gabapentin helps to reduce this pain.  FALLS: Has patient fallen in last 6 months? Yes. Number of falls unknown; pt initially denied falls and then admitted that he has fallen, but no number given.  LIVING ENVIRONMENT: Lives with: lives alone with dog Lives in: 1 level home Stairs: Yes: External: 1 steps; on right going up, on left going up, and can reach both Has following equipment at home:  wc, rollator, SPC, grab bars in tub shower, standard height toilet  PLOF: Independent  PATIENT GOALS: "Increase use of my hand."  OBJECTIVE:  Note: Objective measures were completed at Evaluation unless otherwise  noted.  HAND DOMINANCE: Right  ADLs: Overall ADLs: in general pt reports he uses L non-dominant hand 90% of the time over the R  Transfers/ambulation related to ADLs: using rollator at home but guides with L hand while R hand rests on walker handle; R hand unable to grip brake.  Pt furniture or wall walks in the home as well. Eating: using L non-dominant hand to eat; unable to grip a fork or a glass with R hand, must use 2 hands to drink from a cup Grooming: some difficulty manipulating toothbrush in R hand; goes to the barber to shave  UB Dressing: difficulty with clothing fasteners; keeps shirts buttoned and dons them overhead  LB Dressing: keeps shoes tied and wears as slip ons; difficulty with clothing fasteners on pants  Toileting: using L non-dominant hand for toilet hygiene Bathing: has had falls in the shower;  transfer tub bench recommended (pt plans to order) Tub Shower transfers: would benefit from transfer tub bench Equipment: Grab bars  IADLs: Shopping: modified indep with motorized cart  Light housekeeping: has housekeeper  Meal Prep: pt primarily makes light cold meals such as sandwiches, and reports that he does not use oven, stove, or microwave; uses [employer] mobility: ambulatory only in the home, furniture walking, wall walking, or rollator  Medication management: indep with a pill box  Financial management: indep Handwriting: 100% legible, Increased time, and below baseline status per pt report  MOBILITY STATUS: Hx of falls  POSTURE COMMENTS:  Not accurately assessed this visit as pt preferred to remain in transport chair for eval (supported by back and arm rests)  ACTIVITY TOLERANCE: Activity tolerance: TBD within functional contexts  FUNCTIONAL OUTCOME MEASURES: FOTO: 29; predicted 55  UPPER EXTREMITY ROM:    Active ROM Right eval Left eval  Shoulder flexion 126 (130) 125 (135)  Shoulder abduction 150 150  Shoulder adduction    Shoulder extension    Shoulder internal rotation [facility] WFL  Shoulder external rotation [facility] WFL  Elbow flexion    Elbow extension    Wrist flexion    Wrist extension    Wrist ulnar deviation    Wrist radial deviation    Wrist pronation    Wrist supination    (Blank rows = not tested)  Lacking ~50 degrees MP ext of R 4th digit Lacking ~25 degrees of MP ext of R 5th digit   UPPER EXTREMITY MMT:     MMT Right eval Left eval  Shoulder flexion 3+ 5  Shoulder abduction 4- 5  Shoulder adduction    Shoulder extension    Shoulder internal rotation 4- 5  Shoulder external rotation 3+ 5  Middle trapezius    Lower trapezius    Elbow flexion 4- 5  Elbow extension 4 5  Wrist flexion 3+ 5  Wrist extension 3- 4+  Wrist ulnar deviation    Wrist radial deviation    Wrist pronation 3+ 4+  Wrist supination 3+ 4+  (Blank rows = not  tested)  HAND FUNCTION: Grip strength: Right: 11 lbs; Left: 47 lbs, Lateral pinch: Right: 0 lbs, Left: 8 lbs, and 3 point pinch: Right: 0 lbs, Left: 15 lbs  COORDINATION:  Finger to nose: slow but accurate on the R, mildly ataxic on the L  9 Hole Peg test: Right: 58 sec; Left: 42 sec Able to oppose each digit to thumb with difficulty and extra time on the R hand, L hand able but mildly ataxic   SENSATION: WFL to  light touch BUEs  EDEMA: no visible edema  MUSCLE TONE: RUE hypotonicity  COGNITION: Overall cognitive status: Within functional limits for tasks assessed  VISION: Hx of cataracts removed both eyes last year; wears reading glasses  PRAXIS: Impaired: LUE presents with mild apraxia  TODAY'S TREATMENT:                                                                                                                              DATE: 10/17/23:  Therapeutic Exercise: -Issued yellow theraputty and instructed pt in strengthening and coordination exercises for R hand, including gross grasping, lateral/2 point/3 point pinching, digit abd/add, and digging coins out of putty.  Able to return demo with intermittent vc for technique to improve quality of movement.  Encouraged completion 5-10 min, 1-2x per day.   -Pt participated in BUE flexibility exercise with use of 1.5# dowel for chest press, shoulder flexion, horiz abd/add, abd, ER, IR/extension x 2 sets 10 reps each, working to increase ROM for UB ADLs and overhead reaching for ADL supplies. -Facilitated hand strengthening with use of hand gripper set at 11.2# to remove jumbo pegs from pegboard x3 trials using R hand. Attempted 17.9# setting but unable to manage without help from L hand. -Facilitated R hand lateral and 3 point pinch strengthening working to place yellow and red clothespins (light resistance) on/off a vertical dowel for 2 trials with each pinch type and color.  Min vc and visual demo to formulate correct pinch patterns.   Not yet able to manage green, blue, or black pins. -Issued and reviewed FMC/functional hand exercise handout for pt to add to HEP.  PATIENT EDUCATION: Education details: HEP Person educated: Patient Education method: Explanation, Theatre stage manager Education comprehension: verbalized understanding  HOME EXERCISE PROGRAM: Yellow theraputty, dowel exercises for bilat shoulder flexibility, FMC/dexterity exercises  GOALS: Goals reviewed with patient? Yes  SHORT TERM GOALS: Target date: 11/20/23  Pt will be indep to perform HEP for improving RUE strength and coordination for daily tasks. Baseline: Eval: HEP not yet initiated Goal status: INITIAL  2.  Pt will verbalize and implement 2-3 fall prevention strategies to reduce fall risk with ADLs. Baseline: Eval: Transfer tub bench recommended; pt plans to obtain Goal status: INITIAL  LONG TERM GOALS: Target date: 01/01/24  Pt will increase FOTO score to 55 or better to indicate improvement in self perceived functional use of the R arm with daily tasks. Baseline: Eval: 29 Goal status: INITIAL  2.  Pt will increase R grip strength by 10 or more lbs in order to improve ability to hold and carry ADL supplies in the R dominant hand.    Baseline: Eval: R grip 11 lbs (L 47 lbs) Goal status: INITIAL  3.  Pt will increase R lateral pinch strength by 5 or more lbs to enable pt to hold toothbrush for brushing teeth with R dominant hand. Baseline: Eval: R lateral pinch 0 lbs (L 8 lbs); pt uses L  non-dominant hand to brush teeth Goal status: INITIAL  4.  Pt will increase MMT grossly throughout RUE by 1 MM grade in order to better engage the RUE into daily tasks. Baseline: Eval: See above (pt using L non-dominant arm for 90% of ADL tasks, per his report). Goal status: INITIAL  ASSESSMENT:  CLINICAL IMPRESSION: Initiated and completed yellow theraputty exercises, BUE dowel exercises for increasing B shoulder flexibility, and FMC/dexterity exercises,  all with good tolerance and mod vc for form/technique.  Pt has visual handouts for carryover at home.  Pt tolerated light resistance clothespins; not yet able to manage green, blue, or black for lateral or 3 point pinch in the R hand.  Pt will continue to benefit from skilled OT to address RUE weakness and coordination deficits, to provide education on ADL safety/activity modification/AE recommendations, fall prevention, and HEP instruction in order to maximize safety and indep with daily tasks.  Pt in agreement with plan.   PERFORMANCE DEFICITS: in functional skills including ADLs, IADLs, coordination, dexterity, sensation, tone, ROM, strength, pain, flexibility, Fine motor control, Gross motor control, mobility, balance, body mechanics, endurance, decreased knowledge of use of DME, and UE functional use, and psychosocial skills including coping strategies, environmental adaptation, habits, and routines and behaviors.   IMPAIRMENTS: are limiting patient from ADLs, IADLs, leisure, and social participation.   CO-MORBIDITIES: has co-morbidities such as CHF, hx of CVA, L foot transmetatarsal amputation, DM, HTN, depression  that affects occupational performance. Patient will benefit from skilled OT to address above impairments and improve overall function.  MODIFICATION OR ASSISTANCE TO COMPLETE EVALUATION: No modification of tasks or assist necessary to complete an evaluation.  OT OCCUPATIONAL PROFILE AND HISTORY: Problem focused assessment: Including review of records relating to presenting problem.  CLINICAL DECISION MAKING: Moderate - several treatment options, min-mod task modification necessary  REHAB POTENTIAL: Fair for goals  EVALUATION COMPLEXITY: Moderate    PLAN:  OT FREQUENCY: 2x/week  OT DURATION: 12 weeks  PLANNED INTERVENTIONS: 97168 OT Re-evaluation, 97535 self care/ADL training, 46962 therapeutic exercise, 97530 therapeutic activity, 97112 neuromuscular re-education, 97140  manual therapy, 97116 gait training, 95284 moist heat, 97010 cryotherapy, 97034 contrast bath, 97760 Orthotics management and training, passive range of motion, balance training, functional mobility training, psychosocial skills training, energy conservation, coping strategies training, patient/family education, and DME and/or AE instructions  RECOMMENDED OTHER SERVICES: PT to eval and treat  CONSULTED AND AGREED WITH PLAN OF CARE: Patient  PLAN FOR NEXT SESSION: see above  Danelle Earthly, MS, OTR/L   Otis Dials, OT 10/18/2023, 7:57 AM

## 2023-10-19 ENCOUNTER — Ambulatory Visit: Payer: Medicare Other

## 2023-10-19 DIAGNOSIS — M6281 Muscle weakness (generalized): Secondary | ICD-10-CM | POA: Diagnosis not present

## 2023-10-19 DIAGNOSIS — M5412 Radiculopathy, cervical region: Secondary | ICD-10-CM

## 2023-10-19 DIAGNOSIS — R278 Other lack of coordination: Secondary | ICD-10-CM

## 2023-10-19 NOTE — Therapy (Signed)
OUTPATIENT OCCUPATIONAL THERAPY NEURO TREATMENT NOTE  Patient Name: Chayim Vanwyhe MRN: [id number] DOB:09-15-1956, 67 y.o., male Today's Date: 10/19/2023  PCP: Manson Allan, NP REFERRING PROVIDER: Lovenia Kim, MD  END OF SESSION:  OT End of Session - 10/19/23 0856     Visit Number 3    Number of Visits 24    Date for OT Re-Evaluation 01/01/24    Progress Note Due on Visit 10    OT Start Time 0800    OT Stop Time 0845    OT Time Calculation (min) 45 min    Equipment Utilized During Treatment transport chair    Activity Tolerance Patient tolerated treatment well    Behavior During Therapy WFL for tasks assessed/performed            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)    Depression    Diabetes mellitus without complication (HCC)    Diabetic infection of left foot (HCC)    Dysarthria    Gout    Hemiparesis affecting left side as late effect of cerebrovascular accident (CVA) (HCC)    HLD (hyperlipidemia)    Hyponatremia    Lactic acidosis    Left hallux osteomyelitis (HCC)    Osteomyelitis of ankle or foot, acute, left (HCC)    Stage 3a chronic kidney disease (HCC)    Stroke (HCC)    Type II diabetes mellitus with renal manifestations [facility])    Past Surgical History:  Procedure Laterality Date   ACHILLES TENDON SURGERY Left 08/18/2022   Procedure: ACHILLES LENGTHENING/KIDNER;  Surgeon: Candelaria Stagers, DPM;  Location: [facility];  Service: Podiatry;  Laterality: Left;   AMPUTATION TOE Left 12/16/2021   Procedure: AMPUTATION TOE - Left Great;  Surgeon: Linus Galas, DPM;  Location: [facility];  Service: Podiatry;  Laterality: Left;   AMPUTATION TOE Left 01/14/2022   Procedure: GREAT TOE AMPUTATION;  Surgeon: Linus Galas, DPM;   Location: [facility];  Service: Podiatry;  Laterality: Left;   APPENDECTOMY  1966   CATARACT EXTRACTION W/PHACO Left 05/24/2022   Procedure: CATARACT EXTRACTION PHACO AND INTRAOCULAR LENS PLACEMENT (IOC) LEFT 7.63 00:42.1;  Surgeon: Galen Manila, MD;  Location: [facility];  Service: Ophthalmology;  Laterality: Left;  Diabetic   CATARACT EXTRACTION W/PHACO Right 06/07/2022   Procedure: CATARACT EXTRACTION PHACO AND INTRAOCULAR LENS PLACEMENT (IOC) RIGHT;  Surgeon: Galen Manila, MD;  Location: [facility];  Service: Ophthalmology;  Laterality: Right;  7.33 0:45.3   COLONOSCOPY     LAPAROSCOPIC GASTRIC BANDING     POSTERIOR CERVICAL LAMINECTOMY Right 06/05/2023   Procedure: RIGHT MINIMALLY INVASIVE (MIS) C7-T1 DISCECTOMY;  Surgeon: Loreen Freud, MD;  Location: [facility];  Service: Neurosurgery;  Laterality: Right;   TRANSMETATARSAL AMPUTATION Left 08/18/2022   Procedure: TRANSMETATARSAL AMPUTATION;  Surgeon: Candelaria Stagers, DPM;  Location: [facility];  Service: Podiatry;  Laterality: Left;   WISDOM TOOTH EXTRACTION     Patient Active Problem List   Diagnosis Date Noted   Radiculopathy, cervical 06/05/2023   Cervical radiculopathy 06/05/2023   Diabetic infection of left foot (HCC) 08/17/2022   HLD (hyperlipidemia) 08/17/2022   Chronic diastolic CHF (congestive heart failure) (HCC) 08/17/2022   Type II diabetes mellitus with renal manifestations (HCC)  08/17/2022   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)    Cellulitis of great toe of left foot 01/12/2022   Hemiparesis affecting left side as late effect of cerebrovascular accident (CVA) (HCC) 01/12/2022   Left hallux osteomyelitis (HCC) 01/12/2022   Stage 3a chronic kidney disease (HCC) 01/12/2022   Depression 01/12/2022   Osteomyelitis of ankle or foot, acute, left (HCC) 12/16/2021   Gout    Lactic acidosis    CVA (cerebral vascular accident) (HCC) 12/06/2021   Diabetes mellitus type 2,  uncomplicated (HCC) 12/06/2021   AKI (acute kidney injury) (HCC) 12/06/2021   Hyponatremia 12/06/2021   Dysarthria 12/06/2021   ONSET DATE: 06/05/23   REFERRING DIAG:  G62.9 (ICD-10-CM) - Large fiber neuropathy  M50.10 (ICD-10-CM) - Cervical disc disorder with radiculopathy of cervical region  G56.23 (ICD-10-CM) - Ulnar neuropathy of both upper extremities   THERAPY DIAG:  Muscle weakness (generalized)  Other lack of coordination  Radiculopathy, cervical region  Rationale for Evaluation and Treatment: Rehabilitation  SUBJECTIVE: SUBJECTIVE STATEMENT: Pt reports his tub bench arrived but he doesn't think it's going to work for him. Pt accompanied by: self  PERTINENT HISTORY:  Per medical record, pt underwent microdiscectomy on 06/05/23 for C7/TI.  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  Per medical record from Dr. Ernestine Mcmurray on 09/04/23(neurosurgeon): Mr. Onate is a pleasant 67 y.o. 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  He has been continuing to have worsening symptoms of weakness in his upper and lower extremities as well as decreased function in his hands and decreased balance.  He states that his walking continues to require a hand assist generally against a wall or nearby furniture.  His workup was recently completed demonstrated his known cervical spine issues as well as some lumbar spine issues but also a very severe axonal sensorimotor polyneuropathy.  He does have a history of longstanding diabetes, is seeing his PCP this month.  Been using his glucose monitor.  Will reach out to Dr. Loleta Chance and inquire whether or not he should be seen formally for his neuropathy or if this could be managed more on the primary care level.  Will also get him occupational therapy for his upper extremities.  His glucose management is currently being undertaken by his  PCP.  We did discuss that he has some evidence of compressive neuropathies at the elbows that could be worsening his hand function, however he is not interested in going through with any further surgery given the risks with diabetes and peripheral nerve intervention.   PRECAUTIONS: Fall; No excessive bending, lifting, twisting   WEIGHT BEARING RESTRICTIONS: No  PAIN:  Are you having pain? 5/10 pain in bilat feet.  Gabapentin helps to reduce this pain.  FALLS: Has patient fallen in last 6 months? Yes. Number of falls unknown; pt initially denied falls and then admitted that he has fallen, but no number given.  LIVING ENVIRONMENT: Lives with: lives alone with dog Lives in: 1 level home Stairs: Yes: External: 1 steps; on right going up, on left going up, and can reach both Has following equipment at home:  wc, rollator, SPC, grab bars in tub shower, standard height toilet  PLOF: Independent  PATIENT GOALS: "Increase use of my hand."  OBJECTIVE:  Note: Objective measures were completed at Evaluation unless otherwise noted.  HAND DOMINANCE: Right  ADLs: Overall ADLs: in general  pt reports he uses L non-dominant hand 90% of the time over the R  Transfers/ambulation related to ADLs: using rollator at home but guides with L hand while R hand rests on walker handle; R hand unable to grip brake.  Pt furniture or wall walks in the home as well. Eating: using L non-dominant hand to eat; unable to grip a fork or a glass with R hand, must use 2 hands to drink from a cup Grooming: some difficulty manipulating toothbrush in R hand; goes to the barber to shave  UB Dressing: difficulty with clothing fasteners; keeps shirts buttoned and dons them overhead  LB Dressing: keeps shoes tied and wears as slip ons; difficulty with clothing fasteners on pants  Toileting: using L non-dominant hand for toilet hygiene Bathing: has had falls in the shower; transfer tub bench recommended (pt plans to order) Tub  Shower transfers: would benefit from transfer tub bench Equipment: Grab bars  IADLs: Shopping: modified indep with motorized cart  Light housekeeping: has housekeeper  Meal Prep: pt primarily makes light cold meals such as sandwiches, and reports that he does not use oven, stove, or microwave; uses [employer] mobility: ambulatory only in the home, furniture walking, wall walking, or rollator  Medication management: indep with a pill box  Financial management: indep Handwriting: 100% legible, Increased time, and below baseline status per pt report  MOBILITY STATUS: Hx of falls  POSTURE COMMENTS:  Not accurately assessed this visit as pt preferred to remain in transport chair for eval (supported by back and arm rests)  ACTIVITY TOLERANCE: Activity tolerance: TBD within functional contexts  FUNCTIONAL OUTCOME MEASURES: FOTO: 29; predicted 55  UPPER EXTREMITY ROM:    Active ROM Right eval Left eval  Shoulder flexion 126 (130) 125 (135)  Shoulder abduction 150 150  Shoulder adduction    Shoulder extension    Shoulder internal rotation [facility] WFL  Shoulder external rotation [facility] WFL  Elbow flexion    Elbow extension    Wrist flexion    Wrist extension    Wrist ulnar deviation    Wrist radial deviation    Wrist pronation    Wrist supination    (Blank rows = not tested)  Lacking ~50 degrees MP ext of R 4th digit Lacking ~25 degrees of MP ext of R 5th digit   UPPER EXTREMITY MMT:     MMT Right eval Left eval  Shoulder flexion 3+ 5  Shoulder abduction 4- 5  Shoulder adduction    Shoulder extension    Shoulder internal rotation 4- 5  Shoulder external rotation 3+ 5  Middle trapezius    Lower trapezius    Elbow flexion 4- 5  Elbow extension 4 5  Wrist flexion 3+ 5  Wrist extension 3- 4+  Wrist ulnar deviation    Wrist radial deviation    Wrist pronation 3+ 4+  Wrist supination 3+ 4+  (Blank rows = not tested)  HAND FUNCTION: Grip strength: Right: 11  lbs; Left: 47 lbs, Lateral pinch: Right: 0 lbs, Left: 8 lbs, and 3 point pinch: Right: 0 lbs, Left: 15 lbs  COORDINATION:  Finger to nose: slow but accurate on the R, mildly ataxic on the L  9 Hole Peg test: Right: 58 sec; Left: 42 sec Able to oppose each digit to thumb with difficulty and extra time on the R hand, L hand able but mildly ataxic   SENSATION: WFL to light touch BUEs  EDEMA: no visible edema  MUSCLE TONE:  RUE hypotonicity  COGNITION: Overall cognitive status: Within functional limits for tasks assessed  VISION: Hx of cataracts removed both eyes last year; wears reading glasses  PRAXIS: Impaired: LUE presents with mild apraxia  TODAY'S TREATMENT:                                                                                                                              DATE: 10/01/23:  Therapeutic Exercise: -Instructed in RUE ulnar nerve glide.  Mod A required to achieve desired positioning to maximize stretch.  Instructed in use of wall at home for achieving wrist and digit ext with arm abducted to 90*. -Facilitated R hand lateral and 3 point pinch strengthening working to place yellow and red clothespins (light resistance) on/off a vertical dowel for 2 trials with each pinch type and color.  Min vc and visual demo to formulate correct pinch patterns.  Not yet able to manage green, blue, or black pins. -Facilitated hand strengthening with use of hand gripper set at 11.2# to remove jumbo pegs from pegboard x3 trials using R hand. Attempted 17.9# setting but unable to manage without help from L hand. -Instructed in self passive wrist and digit ext stretch following gripping activities; encouraged completion frequently throughout the day d/t wrist and digit stiffness.   Therapeutic Activity: Facilitated R forearm, wrist, and hand strengthening with participation in EZ board tools.  Pt worked with long handled tool to facilitate R wrist flex/ext on wide strip of velcro.  On  narrow strip of velcro (less resistance) used large base key turn, small and large dial turn, and small dial to facilitate R hand digit flex/ext x3 reps for each tool (up/down board=1 rep).  Pt required mod vc and visual demo to minimize compensatory movement patterns, and min A to move small dowel to perform digit flex/ext.   Self Care: Advised on tub transfer technique with simulation, and alternative options for UE support while using tub bench, including additional vertical grab bar on tub wall, or placement of tub bar on edge of tub.  Pt verbalized understanding of all options and plans to order a standard shower chair with arm rests and a back and feels this will suffice with his current grab bar set up.  PATIENT EDUCATION: Education details: HEP progression; ulnar nerve glide Person educated: Patient Education method: Explanation, visual handouts Education comprehension: verbalized understanding  HOME EXERCISE PROGRAM: Yellow theraputty, dowel exercises for bilat shoulder flexibility, FMC/dexterity exercises  GOALS: Goals reviewed with patient? Yes  SHORT TERM GOALS: Target date: 11/20/23  Pt will be indep to perform HEP for improving RUE strength and coordination for daily tasks. Baseline: Eval: HEP not yet initiated Goal status: INITIAL  2.  Pt will verbalize and implement 2-3 fall prevention strategies to reduce fall risk with ADLs. Baseline: Eval: Transfer tub bench recommended; pt plans to obtain Goal status: INITIAL  LONG TERM GOALS: Target date: 01/01/24  Pt will increase FOTO score to 55  or better to indicate improvement in self perceived functional use of the R arm with daily tasks. Baseline: Eval: 29 Goal status: INITIAL  2.  Pt will increase R grip strength by 10 or more lbs in order to improve ability to hold and carry ADL supplies in the R dominant hand.    Baseline: Eval: R grip 11 lbs (L 47 lbs) Goal status: INITIAL  3.  Pt will increase R lateral pinch  strength by 5 or more lbs to enable pt to hold toothbrush for brushing teeth with R dominant hand. Baseline: Eval: R lateral pinch 0 lbs (L 8 lbs); pt uses L non-dominant hand to brush teeth Goal status: INITIAL  4.  Pt will increase MMT grossly throughout RUE by 1 MM grade in order to better engage the RUE into daily tasks. Baseline: Eval: See above (pt using L non-dominant arm for 90% of ADL tasks, per his report). Goal status: INITIAL  ASSESSMENT:  CLINICAL IMPRESSION: Good tolerance to above noted activities this date.  Pt reports he's been working with his theraputty at home and will now add R wrist and digit ext passive stretching and ulnar nerve stretch to HEP.  Not yet able to achieve full ROM with ulnar nerve glide, but encouraged to perform each step to tolerance.  Pt required intermittent min A to guide EZ board tools d/t decreased coordination and weakness in the hand, requiring narrow strip of velcro for low resistance for all tools other than long handled tool.  Pt will continue to benefit from skilled OT to address RUE weakness and coordination deficits, to provide education on ADL safety/activity modification/AE recommendations, fall prevention, and HEP instruction in order to maximize safety and indep with daily tasks.  Pt in agreement with plan.   PERFORMANCE DEFICITS: in functional skills including ADLs, IADLs, coordination, dexterity, sensation, tone, ROM, strength, pain, flexibility, Fine motor control, Gross motor control, mobility, balance, body mechanics, endurance, decreased knowledge of use of DME, and UE functional use, and psychosocial skills including coping strategies, environmental adaptation, habits, and routines and behaviors.   IMPAIRMENTS: are limiting patient from ADLs, IADLs, leisure, and social participation.   CO-MORBIDITIES: has co-morbidities such as CHF, hx of CVA, L foot transmetatarsal amputation, DM, HTN, depression  that affects occupational performance.  Patient will benefit from skilled OT to address above impairments and improve overall function.  MODIFICATION OR ASSISTANCE TO COMPLETE EVALUATION: No modification of tasks or assist necessary to complete an evaluation.  OT OCCUPATIONAL PROFILE AND HISTORY: Problem focused assessment: Including review of records relating to presenting problem.  CLINICAL DECISION MAKING: Moderate - several treatment options, min-mod task modification necessary  REHAB POTENTIAL: Fair for goals  EVALUATION COMPLEXITY: Moderate    PLAN:  OT FREQUENCY: 2x/week  OT DURATION: 12 weeks  PLANNED INTERVENTIONS: 97168 OT Re-evaluation, 97535 self care/ADL training, 21308 therapeutic exercise, 97530 therapeutic activity, 97112 neuromuscular re-education, 97140 manual therapy, 97116 gait training, 65784 moist heat, 97010 cryotherapy, 97034 contrast bath, 97760 Orthotics management and training, passive range of motion, balance training, functional mobility training, psychosocial skills training, energy conservation, coping strategies training, patient/family education, and DME and/or AE instructions  RECOMMENDED OTHER SERVICES: PT to eval and treat  CONSULTED AND AGREED WITH PLAN OF CARE: Patient  PLAN FOR NEXT SESSION: see above  Danelle Earthly, MS, OTR/L   Otis Dials, OT 10/19/2023, 8:57 AM

## 2023-10-30 ENCOUNTER — Ambulatory Visit: Payer: Medicare Other

## 2023-11-02 ENCOUNTER — Ambulatory Visit: Payer: Medicare Other

## 2023-11-07 ENCOUNTER — Ambulatory Visit: Payer: Medicare Other

## 2023-11-08 ENCOUNTER — Ambulatory Visit: Payer: Medicare Other

## 2023-11-10 ENCOUNTER — Ambulatory Visit: Payer: Medicare Other

## 2023-11-17 ENCOUNTER — Encounter: Payer: Medicare Other | Admitting: Physical Therapy

## 2023-11-20 ENCOUNTER — Encounter: Payer: Medicare Other | Admitting: Physical Therapy

## 2023-11-29 ENCOUNTER — Encounter: Payer: Medicare Other | Admitting: Physical Therapy

## 2023-12-14 ENCOUNTER — Encounter: Payer: Medicare Other | Admitting: Physical Therapy

## 2024-01-10 ENCOUNTER — Ambulatory Visit (INDEPENDENT_AMBULATORY_CARE_PROVIDER_SITE_OTHER): Admitting: Psychiatry

## 2024-01-10 ENCOUNTER — Encounter: Payer: Self-pay | Admitting: Psychiatry

## 2024-01-10 VITALS — BP 122/76 | HR 104 | Temp 98.1°F | Ht 74.0 in | Wt 235.0 lb

## 2024-01-10 DIAGNOSIS — F4310 Post-traumatic stress disorder, unspecified: Secondary | ICD-10-CM

## 2024-01-10 DIAGNOSIS — F331 Major depressive disorder, recurrent, moderate: Secondary | ICD-10-CM | POA: Diagnosis not present
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
## 2024-02-08 ENCOUNTER — Ambulatory Visit (INDEPENDENT_AMBULATORY_CARE_PROVIDER_SITE_OTHER): Admitting: Psychiatry

## 2024-02-08 DIAGNOSIS — F331 Major depressive disorder, recurrent, moderate: Secondary | ICD-10-CM

## 2024-02-08 DIAGNOSIS — F4310 Post-traumatic stress disorder, unspecified: Secondary | ICD-10-CM

## 2024-02-08 MED ORDER — ESCITALOPRAM OXALATE 5 MG PO TABS: 5.0000 mg | ORAL_TABLET | Freq: Every day | ORAL | 0 refills | Status: DC

## 2024-02-08 NOTE — Progress Notes (Addendum)
 BH MD/PA/NP OP Progress Note  02/08/2024 9:29 AM [name]  MRN:  [id number]  Chief Complaint: " I am experiencing overeating I am still depressed"  HPI: 68 year old male presenting to the [facility] for routine follow-up for started out the conversation stating that he is worried about his sister in which she is not sure how to deal with her.  Patient reports that he has also been over eating at home and states that he has had bariatric surgery in the past and is afraid that he will start eating again that would cause him to gain weight on top of all of his health problems.  Patient still reflects on his time in [employer] and states that at this point he is now ready to take medications knowing that the providers are here in his best interest.  He endorses that the current stressors and depression is due to his debilitating physical state stating he is having issues with his foot as well as his hands.  Patient reports that he has fallen within the last week due to trying to take a garbage can down the hill and was unable to catch himself and ended up falling on the cement.  Patient was recommended to get a cane in which he reported that " I am too hard headed I have a walker at home".  Based on this interview and assessment it is recommended for the patient to start on Lexapro 5 mg once a day due to depressive symptoms to determine therapeutic response.  Patient has been educated on side effects initially of jitteriness as well as nervousness due to reregulation of serotonin and the patient was in agreement and will notify clinic should he have symptoms lasting longer than a week.  Patient denies SI, HI patient will follow up in 1 month.  Past Psychiatric History:  Previous Psych Hospitalizations: 2023, Previously hospitalized for 4 weeks, with ECT Therapy.   Previous Dx: MDD, PTSD.  Medications Current: -Seroquel 25 mg once daily before bed. -Wellbutrin 300 mg once daily  Medication Trials:  Unable to recall.  Suicide & Violence: Denies SI, HI.  Denies having a firearm within the home  Psychotherapy: Denies and states he is not interested in in therapy.  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)    Depression    Diabetes mellitus without complication (HCC)    Diabetic infection of left foot (HCC)    Dysarthria    Gout    Hemiparesis affecting left side as late effect of cerebrovascular accident (CVA) (HCC)    HLD (hyperlipidemia)    Hyponatremia    Lactic acidosis    Left hallux osteomyelitis (HCC)    Osteomyelitis of ankle or foot, acute, left (HCC)    Stage 3a chronic kidney disease (HCC)    Stroke (HCC)    Type II diabetes mellitus with renal manifestations [facility])     Past Surgical History:  Procedure Laterality Date   ACHILLES TENDON SURGERY Left 08/18/2022   Procedure: ACHILLES LENGTHENING/KIDNER;  Surgeon: Candelaria Stagers, DPM;  Location: [facility] OR[facility];  Service: Podiatry;  Laterality: Left;   AMPUTATION TOE Left 12/16/2021   Procedure: AMPUTATION TOE - Left  Great;  Surgeon: Linus Galas, DPM;  Location: [facility];  Service: Podiatry;  Laterality: Left;   AMPUTATION TOE Left 01/14/2022   Procedure: GREAT TOE AMPUTATION;  Surgeon: Linus Galas, DPM;  Location: [facility];  Service: Podiatry;  Laterality: Left;   APPENDECTOMY  1966   CATARACT EXTRACTION W/PHACO Left 05/24/2022   Procedure: CATARACT EXTRACTION PHACO AND INTRAOCULAR LENS PLACEMENT (IOC) LEFT 7.63 00:42.1;  Surgeon: Galen Manila, MD;  Location: [facility];  Service: Ophthalmology;  Laterality: Left;  Diabetic   CATARACT EXTRACTION W/PHACO Right 06/07/2022   Procedure: CATARACT EXTRACTION PHACO AND INTRAOCULAR LENS PLACEMENT  (IOC) RIGHT;  Surgeon: Galen Manila, MD;  Location: [facility];  Service: Ophthalmology;  Laterality: Right;  7.33 0:45.3   COLONOSCOPY     LAPAROSCOPIC GASTRIC BANDING     POSTERIOR CERVICAL LAMINECTOMY Right 06/05/2023   Procedure: RIGHT MINIMALLY INVASIVE (MIS) C7-T1 DISCECTOMY;  Surgeon: Loreen Freud, MD;  Location: [facility];  Service: Neurosurgery;  Laterality: Right;   TRANSMETATARSAL AMPUTATION Left 08/18/2022   Procedure: TRANSMETATARSAL AMPUTATION;  Surgeon: Candelaria Stagers, DPM;  Location: [facility];  Service: Podiatry;  Laterality: Left;   WISDOM TOOTH EXTRACTION      Family Psychiatric History: No additional  Family History: No family history on file.  Social History:  Social History   Socioeconomic History   Marital status: Single    Spouse name: Not on file   Number of children: Not on file   Years of education: Not on file   Highest education level: Not on file  Occupational History   Not on file  Tobacco Use   Smoking status: Former    Current packs/day: 0.00    Types: Cigarettes    Quit date: 10/07/1997    Years since quitting: 26.3   Smokeless tobacco: Never  Vaping Use   Vaping status: Never Used  Substance and Sexual Activity   Alcohol use: Never   Drug use: Never   Sexual activity: Not Currently  Other Topics Concern   Not on file  Social History Narrative   LIVES ALONE   Social Drivers of Health   Financial Resource Strain: Low Risk  (01/11/2024)   Received from [facility]   Overall Financial Resource Strain (CARDIA)    Difficulty of Paying Living Expenses: Not hard at all  Food Insecurity: No Food Insecurity (01/11/2024)   Received from [facility]   Hunger Vital Sign    Worried About Running Out of Food in the Last Year: Never true    Ran Out of Food in the Last Year: Never true  Transportation Needs: No Transportation Needs (01/11/2024)   Received from [facility] - Transportation    In the past 12 months, has lack of transportation kept you from medical appointments or from getting medications?: No    Lack of Transportation (Non-Medical): No  Physical Activity: Inactive (11/21/2023)   Received from [facility]   Exercise Vital Sign    Days of Exercise per Week: 0 days    Minutes of Exercise per Session: 0 min  Stress: No Stress Concern Present (11/21/2023)   Received from [facility] - Occupational Stress Questionnaire    Feeling of Stress : Only a little  Social Connections: Socially Isolated (11/21/2023)   Received from [facility]   Social Connection and Isolation Panel [NHANES]    Frequency  of Communication with Friends and Family: Never    Frequency of Social Gatherings with Friends and Family: Once a week    Attends Religious Services: Never    Database administrator or Organizations: No    Attends Engineer, structural: Never    Marital Status: Divorced    Allergies: No Known Allergies  Metabolic Disorder Labs: Lab Results  Component Value Date   HGBA1C 7.0 (H) 08/19/2022   MPG 154.2 08/19/2022   MPG 139.85 12/16/2021   No results found for: "PROLACTIN" Lab Results  Component Value Date   CHOL 130 12/06/2021   TRIG 84 12/06/2021   HDL 27 (L) 12/06/2021   CHOLHDL 4.8 12/06/2021   VLDL 17 12/06/2021   LDLCALC 86 12/06/2021   No results found for: "TSH"  Therapeutic Level Labs: No results found for: "LITHIUM" No results found for: "VALPROATE" No results found for: "CBMZ"  Current Medications: Current Outpatient Medications  Medication Sig Dispense Refill   allopurinol (ZYLOPRIM) 300 MG tablet Take 300 mg by mouth daily.     atorvastatin (LIPITOR) 80 MG tablet Take 1 tablet (80 mg total) by mouth daily. 90 tablet 0   B-D UF III MINI PEN NEEDLES 31G X 5 MM MISC Inject 1 each into the skin 3 (three) times daily.      BD DISP NEEDLES 18G X 1-1/2" MISC      BD PLASTIPAK SYRINGE 21G X 1" 3 ML MISC      BD PLASTIPAK SYRINGE 3 ML MISC      BD PRECISIONGLIDE NEEDLE 23G X 1-1/2" MISC      buPROPion (WELLBUTRIN XL) 150 MG 24 hr tablet Take 150 mg by mouth daily.     Continuous Glucose Receiver (FREESTYLE LIBRE 14 DAY READER) DEVI Use 1 Device as directed     cyanocobalamin 100 MCG tablet Take 100 mcg by mouth daily.     doxycycline (VIBRAMYCIN) 100 MG capsule Take 100 mg by mouth 2 (two) times daily.     empagliflozin (JARDIANCE) 25 MG TABS tablet Take 25 mg by mouth daily.     gabapentin (NEURONTIN) 400 MG capsule Take 400 mg by mouth 3 (three) times daily.     LANTUS SOLOSTAR 100 UNIT/ML Solostar Pen Inject 6 Units into the skin at bedtime.     lisinopril (ZESTRIL) 5 MG tablet Take 5 mg by mouth daily.     metFORMIN (GLUCOPHAGE) 500 MG tablet Take 1,000 mg by mouth 2 (two) times daily with a meal.     methocarbamol (ROBAXIN) 500 MG tablet Take 1 tablet (500 mg total) by mouth every 6 (six) hours as needed for muscle spasms. 120 tablet 0   pravastatin (PRAVACHOL) 80 MG tablet Take 80 mg by mouth daily.     QUEtiapine (SEROQUEL) 25 MG tablet Take 25 mg by mouth at bedtime.     rOPINIRole (REQUIP) 0.5 MG tablet Take 0.5 mg by mouth at bedtime.     Semaglutide 14 MG TABS Take 14 mg by mouth daily.     sildenafil (VIAGRA) 100 MG tablet Take 100 mg by mouth daily as needed for erectile dysfunction.     testosterone cypionate (DEPOTESTOSTERONE CYPIONATE) 200 MG/ML injection Inject into the muscle every 14 (fourteen) days.     No current facility-administered medications for this visit.     Musculoskeletal: Strength & Muscle Tone: decreased Gait & Station: unsteady Patient leans: Front  Psychiatric Specialty Exam: Review of Systems  Constitutional: Negative.   HENT: Negative.  Eyes: Negative.   Respiratory: Negative.    Cardiovascular: Negative.   Gastrointestinal: Negative.   Endocrine: Negative.    Genitourinary: Negative.   Musculoskeletal:  Positive for back pain and myalgias.  Skin:  Positive for rash.  Allergic/Immunologic: Negative.   Neurological:  Positive for weakness.  Hematological: Negative.   Psychiatric/Behavioral:  Positive for dysphoric mood. The patient is nervous/anxious.     There were no vitals taken for this visit.There is no height or weight on file to calculate BMI.  General Appearance: Well Groomed  Eye Contact:  Good  Speech:  Clear and Coherent  Volume:  Normal  Mood:  Depressed  Affect:  Depressed  Thought Process:  Coherent  Orientation:  Full (Time, Place, and Person)  Thought Content: Logical   Suicidal Thoughts:  No  Homicidal Thoughts:  No  Memory:  Immediate;   Good Recent;   Good Remote;   Good  Judgement:  Good  Insight:  Good  Psychomotor Activity:  Normal  Concentration:  Concentration: Good and Attention Span: Good  Recall:  Good  Fund of Knowledge: Good  Language: Good  Akathisia:  No  Handed:  Right  AIMS (if indicated):   Assets:  Desire for Improvement Housing  ADL's:  Intact  Cognition: WNL  Sleep:  Good   Screenings: PHQ2-9    Flowsheet Row [facility] Visit [facility]/10/2024 in [facility]  PHQ-2 Total Score 4  PHQ-9 Total Score 14      Flowsheet Row Office Visit from 01/10/2024 in [facility] Admission (Discharged) from 06/05/2023 in [facility] ED from 05/15/2023 in [facility]  C-SSRS RISK CATEGORY No Risk No Risk No Risk        Assessment and Plan:   Assessment - Diagnosis: Major depressive disorder, recurrent episode, moderate (HCC) [F33.1]  2. PTSD (post-traumatic stress disorder) [F43.10]  Differential Diagnosis: Depression related to medical condition Peripheral neuropathy  - Progress: Baseline appointment - Risk Factors: Risk for suicide  identified due to major depressive disorder diagnosis.  Plan - Medications:  Continue Seroquel 25 mg once daily at night for sleep, prescribed by Glenda fields Continue taking Wellbutrin 300 mg once daily, prescribed by [employer] Lexapro 5 mg once daily for depressive symptoms, patient educated on jitteriness as well as nervousness and to wait a week and to notify us should symptoms last longer than a week.  Patient has also educated to notify the provider of confusion or movements right away due to possible serotonin syndrome with the relationship between Seroquel and Lexapro.  - Psychotherapy: Patient requesting to participate in therapy and symptom management with this provider. - Education: Patient educated on worsening symptoms of MDD as well as PTSD, patient was shared with resources of talk therapy and referral list in which he declined and states that he would prefer to work with a psychiatric provider and symptom management. - Follow-Up: Every 4 weeks for symptom management and therapy - Referrals: No referrals - Safety Planning: Patient currently denies SI, HI.  Patient agrees to safety plan of reaching out to the clinic should he have worsening symptoms of depression.  Patient also notified and instructed to call 911 or go to the closest hospital should the patient experience suicidal thoughts with or without a plan.  Patient endorses is having no firearms in the home.  Patient is in agreement with safety plan    Patient/Guardian was  advised Release of Information must be obtained prior to any record release in order to collaborate their care with an outside provider. Patient/Guardian was advised if they have not already done so to contact the registration department to sign all necessary forms in order for [place] to release information regarding their care.   Consent: Patient/Guardian gives verbal consent for treatment and assignment of benefits for services provided during this  visit. Patient/Guardian expressed understanding and agreed to proceed.  This office note has been dictated. This dictation was prepared using Air traffic controller. As a result, errors may occur. When identified, these errors have been corrected. While every attempt is made to correct errors during dictation, errors may still exist.    Juliann Pares, NP 02/08/2024, 9:29 AM

## 2024-02-14 ENCOUNTER — Other Ambulatory Visit: Payer: Self-pay | Admitting: Medical Genetics

## 2024-02-15 ENCOUNTER — Other Ambulatory Visit
Admission: RE | Admit: 2024-02-15 | Discharge: 2024-02-15 | Disposition: A | Discharge status: Home / Self Care | Payer: Self-pay | Source: Ambulatory Visit | Attending: Medical Genetics | Admitting: Medical Genetics

## 2024-02-22 ENCOUNTER — Telehealth: Payer: Self-pay

## 2024-02-22 ENCOUNTER — Other Ambulatory Visit (INDEPENDENT_AMBULATORY_CARE_PROVIDER_SITE_OTHER): Admitting: Psychiatry

## 2024-02-22 DIAGNOSIS — F331 Major depressive disorder, recurrent, moderate: Secondary | ICD-10-CM

## 2024-02-22 DIAGNOSIS — F4310 Post-traumatic stress disorder, unspecified: Secondary | ICD-10-CM

## 2024-02-22 MED ORDER — ESCITALOPRAM OXALATE 10 MG PO TABS
10.0000 mg | ORAL_TABLET | Freq: Every day | ORAL | 0 refills | Status: DC
Start: 2024-02-22 — End: 2024-03-11

## 2024-02-22 NOTE — Telephone Encounter (Signed)
 pt return called he states that last visit you started him on the lexapro  5mg  he like to go up on it. if you would send in rx to the pharmacy. pt was also transfered to the front desk to make a follow up appt since pt did not have one set up.

## 2024-02-22 NOTE — Telephone Encounter (Signed)
 left message asking if he would call office back to give more details of what was going on and the medication he is referring too.

## 2024-02-22 NOTE — Telephone Encounter (Signed)
 pt left a message asking if you would double his medication. but he did not state what medication nor why he wanted medication increased.

## 2024-02-22 NOTE — Progress Notes (Signed)
 Received a request for a call today, phone call returned to the patient with a request to increase Lexapro .  Pt reports no side effects and would like to increase due to no side effects.  Pt also reported no therapeutic effect which he was reminded that effects can take up to 4 weeks.   Pt dose was increased to Lexapro  10mg  once daily, and has been educated to monitor for side effects.  Pt verbalized understanding and verbalized that he will call the clinic if he has concerns or symptoms.  Please see orders.

## 2024-02-27 LAB — GENECONNECT MOLECULAR SCREEN: Genetic Analysis Overall Interpretation: NEGATIVE

## 2024-03-11 ENCOUNTER — Encounter: Payer: Self-pay | Admitting: Psychiatry

## 2024-03-11 ENCOUNTER — Ambulatory Visit (INDEPENDENT_AMBULATORY_CARE_PROVIDER_SITE_OTHER): Admitting: Psychiatry

## 2024-03-11 VITALS — BP 118/72 | HR 71 | Temp 98.6°F

## 2024-03-11 DIAGNOSIS — F331 Major depressive disorder, recurrent, moderate: Secondary | ICD-10-CM

## 2024-03-11 DIAGNOSIS — F4310 Post-traumatic stress disorder, unspecified: Secondary | ICD-10-CM | POA: Diagnosis not present

## 2024-03-11 MED ORDER — QUETIAPINE FUMARATE 25 MG PO TABS: 25.0000 mg | ORAL_TABLET | Freq: Every day | ORAL | 0 refills | Status: AC

## 2024-03-11 MED ORDER — ESCITALOPRAM OXALATE 20 MG PO TABS: 20.0000 mg | ORAL_TABLET | Freq: Every day | ORAL | 0 refills | Status: DC

## 2024-03-11 MED ORDER — BUPROPION HCL ER (XL) 150 MG PO TB24
150.0000 mg | ORAL_TABLET | Freq: Every day | ORAL | 0 refills | Status: DC
Start: 2024-03-11 — End: 2024-06-13

## 2024-03-11 NOTE — Progress Notes (Signed)
 BH MD/PA/NP OP Progress Note  03/11/2024 11:15 AM [name]  MRN:  [id number]  Chief Complaint:  Chief Complaint  Patient presents with   Follow-up   HPI: 68 year old male presenting to [facility] for follow-up.  Patient reports that the Lexapro  has not been working at 10 mg and he feels that there has been no change and is interested in increasing.  Patient endorses symptoms of continued depression, fatigue, anhedonia, and irritability.  Patient reports that he has been engaging socially with his friends and colleagues in the community reporting that the depression systems just do not seem to be easing off.  Patient also states that from our last conversation about Spravato, if he could be referred to be considered for Spravato.  Patient reports that he has has been over eating stating that he has been eating a lot of chips buying close to 8 bags of chips and a week and reporting that he has been eating without control.  Patient states that he would like to try to change this behavior in which patient was offered suggestions with replacement techniques and considering rice crackers, cauliflower chips or other alternatives in which he states he will be open to trying.  Based on this assessment interview, patient symptoms have not improved patient will be increased on Lexapro  to 20 mg once a day.  Patient will continue his medications of Seroquel  25 mg once a night as well as Wellbutrin  150 mg once a day.  Patient reports that he would like to be considered for Spravato in which referral was sent to [facility] for consideration.  Patient also actively participate in psychotherapy with this provider.  Patient will follow up in 1 month. Visit Diagnosis:    ICD-10-CM   1. Major depressive disorder, recurrent episode, moderate (HCC)  F33.1 escitalopram  (LEXAPRO ) 20 MG tablet    buPROPion  (WELLBUTRIN  XL) 150 MG 24 hr tablet    QUEtiapine  (SEROQUEL ) 25 MG tablet    2. PTSD (post-traumatic stress disorder)   F43.10       Past Psychiatric History:  Previous Psych Hospitalizations:  -2023, Previously hospitalized for 4 weeks, with ECT Therapy.    Previous Dx: MDD, PTSD.   Medications Current: -Seroquel  25 mg once daily before bed. -Wellbutrin  150 mg once daily -Lexapro  20mg  once daily   Medication Trials:  - Haldol, over sedating, stopped by patient, in the [employer] - Zoloft - poor response, in the [employer] - Prozac - poor response, in the [employer] - Zyprexa - "disturbed thinking, stopped by patient", in the [employer] - Completed a course of TMS in 1999.    Suicide & Violence: Denies SI, HI.  Denies having a firearm within the home   Psychotherapy: Denies and states he is not interested in in therapy.   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)    Depression    Diabetes mellitus without complication (HCC)    Diabetic infection of left foot (HCC)    Dysarthria    Gout    Hemiparesis affecting left side as late effect of cerebrovascular accident (CVA) (HCC)    HLD (hyperlipidemia)    Hyponatremia    Lactic acidosis    Left hallux osteomyelitis (  HCC)    Osteomyelitis of ankle or foot, acute, left (HCC)    Stage 3a chronic kidney disease (HCC)    Stroke (HCC)    Type II diabetes mellitus with renal manifestations [facility])     Past Surgical History:  Procedure Laterality Date   ACHILLES TENDON SURGERY Left 08/18/2022   Procedure: ACHILLES LENGTHENING/KIDNER;  Surgeon: Velma Ghazi, DPM;  Location: [facility] OR[facility];  Service: Podiatry;  Laterality: Left;   AMPUTATION TOE Left 12/16/2021   Procedure: AMPUTATION TOE - Left Great;  Surgeon: Angel Barba, DPM;  Location: [facility];  Service: Podiatry;  Laterality: Left;    AMPUTATION TOE Left 01/14/2022   Procedure: GREAT TOE AMPUTATION;  Surgeon: Angel Barba, DPM;  Location: [facility];  Service: Podiatry;  Laterality: Left;   APPENDECTOMY  1966   CATARACT EXTRACTION W/PHACO Left 05/24/2022   Procedure: CATARACT EXTRACTION PHACO AND INTRAOCULAR LENS PLACEMENT (IOC) LEFT 7.63 00:42.1;  Surgeon: Clair Crews, MD;  Location: [facility];  Service: Ophthalmology;  Laterality: Left;  Diabetic   CATARACT EXTRACTION W/PHACO Right 06/07/2022   Procedure: CATARACT EXTRACTION PHACO AND INTRAOCULAR LENS PLACEMENT (IOC) RIGHT;  Surgeon: Clair Crews, MD;  Location: [facility];  Service: Ophthalmology;  Laterality: Right;  7.33 0:45.3   COLONOSCOPY     LAPAROSCOPIC GASTRIC BANDING     POSTERIOR CERVICAL LAMINECTOMY Right 06/05/2023   Procedure: RIGHT MINIMALLY INVASIVE (MIS) C7-T1 DISCECTOMY;  Surgeon: Aurther Blue, MD;  Location: [facility];  Service: Neurosurgery;  Laterality: Right;   TRANSMETATARSAL AMPUTATION Left 08/18/2022   Procedure: TRANSMETATARSAL AMPUTATION;  Surgeon: Velma Ghazi, DPM;  Location: [facility];  Service: Podiatry;  Laterality: Left;   WISDOM TOOTH EXTRACTION      Family Psychiatric History: No known psychiatric issues with the family.   Family History: History reviewed. No pertinent family history.  Social History:  Social History   Socioeconomic History   Marital status: Single    Spouse name: Not on file   Number of children: Not on file   Years of education: Not on file   Highest education level: Not on file  Occupational History   Not on file  Tobacco Use   Smoking status: Former    Current packs/day: 0.00    Types: Cigarettes    Quit date: 10/07/1997    Years since quitting: 26.4   Smokeless tobacco: Never  Vaping Use   Vaping status: Never Used  Substance and Sexual Activity   Alcohol use: Never   Drug use: Never   Sexual activity: Not Currently  Other Topics Concern   Not on file  Social  History Narrative   LIVES ALONE   Social Drivers of Health   Financial Resource Strain: Low Risk  (03/11/2024)   Received from [facility]   Overall Financial Resource Strain (CARDIA)    Difficulty of Paying Living Expenses: Not hard at all  Food Insecurity: No Food Insecurity (03/11/2024)   Received from [facility]   Hunger Vital Sign    Worried About Running Out of Food in the Last Year: Never true    Ran Out of Food in the Last Year: Never true  Transportation Needs: No Transportation Needs (03/11/2024)   Received from [facility] - Transportation    In the past 12 months, has lack of transportation kept you from medical appointments or from getting medications?: No    Lack of Transportation (Non-Medical): No  Physical Activity: Inactive (  11/21/2023)   Received from [facility]   Exercise Vital Sign    Days of Exercise per Week: 0 days    Minutes of Exercise per Session: 0 min  Stress: No Stress Concern Present (11/21/2023)   Received from [facility] - Occupational Stress Questionnaire    Feeling of Stress : Only a little  Social Connections: Socially Isolated (11/21/2023)   Received from [facility]   Social Connection and Isolation Panel [NHANES]    Frequency of Communication with Friends and Family: Never    Frequency of Social Gatherings with Friends and Family: Once a week    Attends Religious Services: Never    Database administrator or Organizations: No    Attends Engineer, structural: Never    Marital Status: Divorced    Allergies: No Known Allergies  Metabolic Disorder Labs: Lab Results  Component Value Date   HGBA1C 7.0 (H) 08/19/2022   MPG 154.2 08/19/2022   MPG 139.85 12/16/2021   No results found for: "PROLACTIN" Lab Results  Component Value Date   CHOL 130 12/06/2021   TRIG 84  12/06/2021   HDL 27 (L) 12/06/2021   CHOLHDL 4.8 12/06/2021   VLDL 17 12/06/2021   LDLCALC 86 12/06/2021   No results found for: "TSH"  Therapeutic Level Labs: No results found for: "LITHIUM" No results found for: "VALPROATE" No results found for: "CBMZ"  Current Medications: Current Outpatient Medications  Medication Sig Dispense Refill   allopurinol  (ZYLOPRIM ) 300 MG tablet Take 300 mg by mouth daily.     atorvastatin  (LIPITOR) 80 MG tablet Take 1 tablet (80 mg total) by mouth daily. 90 tablet 0   B-D UF III MINI PEN NEEDLES 31G X 5 MM MISC Inject 1 each into the skin 3 (three) times daily.     BD DISP NEEDLES 18G X 1-1/2" MISC      BD PLASTIPAK SYRINGE 21G X 1" 3 ML MISC      BD PLASTIPAK SYRINGE 3 ML MISC      BD PRECISIONGLIDE NEEDLE 23G X 1-1/2" MISC      buPROPion  (WELLBUTRIN  XL) 150 MG 24 hr tablet Take 150 mg by mouth daily.     Continuous Glucose Receiver (FREESTYLE LIBRE 14 DAY READER) DEVI Use 1 Device as directed     cyanocobalamin  100 MCG tablet Take 100 mcg by mouth daily.     doxycycline  (VIBRAMYCIN ) 100 MG capsule Take 100 mg by mouth 2 (two) times daily.     empagliflozin (JARDIANCE) 25 MG TABS tablet Take 25 mg by mouth daily.     escitalopram  (LEXAPRO ) 10 MG tablet Take 1 tablet (10 mg total) by mouth daily. 30 tablet 0   gabapentin  (NEURONTIN ) 400 MG capsule Take 400 mg by mouth 3 (three) times daily.     LANTUS  SOLOSTAR 100 UNIT/ML Solostar Pen Inject 6 Units into the skin at bedtime.     lisinopril  (ZESTRIL ) 5 MG tablet Take 5 mg by mouth daily.     metFORMIN (GLUCOPHAGE) 500 MG tablet Take 1,000 mg by mouth 2 (two) times daily with a meal.     methocarbamol  (ROBAXIN ) 500 MG tablet Take 1 tablet (500 mg total) by mouth every 6 (six) hours as needed for muscle spasms. 120 tablet 0   pravastatin  (PRAVACHOL ) 80 MG tablet Take 80 mg by mouth daily.     QUEtiapine  (SEROQUEL ) 25 MG tablet Take 25 mg by  mouth at bedtime.     rOPINIRole  (REQUIP ) 0.5 MG tablet Take  0.5 mg by mouth at bedtime.     Semaglutide 14 MG TABS Take 14 mg by mouth daily.     sildenafil (VIAGRA) 100 MG tablet Take 100 mg by mouth daily as needed for erectile dysfunction.     testosterone cypionate (DEPOTESTOSTERONE CYPIONATE) 200 MG/ML injection Inject into the muscle every 14 (fourteen) days.     No current facility-administered medications for this visit.     Musculoskeletal: Strength & Muscle Tone: decreased Gait & Station: normal Patient leans: N/A  Psychiatric Specialty Exam: Review of Systems  Blood pressure 118/72, pulse 71, temperature 98.6 F (37 C), temperature source Temporal, SpO2 97%.There is no height or weight on file to calculate BMI.  General Appearance: Well Groomed  Eye Contact:  Good  Speech:  Clear and Coherent  Volume:  Increased  Mood:  Anxious and Depressed  Affect:  Appropriate  Thought Process:  Coherent  Orientation:  Full (Time, Place, and Person)  Thought Content: WDL   Suicidal Thoughts:  No  Homicidal Thoughts:  No  Memory:  Immediate;   Good Recent;   Good Remote;   Good  Judgement:  Good  Insight:  Good  Psychomotor Activity:  Normal  Concentration:  Concentration: Good and Attention Span: Good  Recall:  Good  Fund of Knowledge: Good  Language: Good  Akathisia:  No  Handed:  Right  AIMS (if indicated): not done  Assets:  Desire for Improvement Financial Resources/Insurance Housing  ADL's:  Intact  Cognition: WNL  Sleep:  Good   Screenings: GAD-7    Flowsheet Row Office Visit from 03/11/2024 in [facility]  Total GAD-7 Score 14      PHQ2-9    Flowsheet Row Office Visit from 03/11/2024 in [facility] Office Visit from 02/08/2024 in [facility] Office Visit from 01/10/2024 in [facility]  PHQ-2 Total Score 6 6 4   PHQ-9 Total Score 25 24 14        Flowsheet Row Office Visit from 03/11/2024 in [facility] Office Visit from 02/08/2024 in [facility] Office Visit from 01/10/2024 in [facility]  C-SSRS RISK CATEGORY No Risk No Risk No Risk        Assessment and Plan:   Assessment - Diagnosis: Major depressive disorder, recurrent episode, moderate (HCC) [F33.1]  2. PTSD (post-traumatic stress disorder) [F43.10]  Differential Diagnosis: Depression related to medical condition Peripheral neuropathy  - Progress: PT reports no change in his symptoms, of depression reporting that 10mg  of Lexapro  is not improving his symptoms.   - Risk Factors: Risk for suicide identified due to major depressive disorder diagnosis.  Plan - Medications:  Continue Seroquel  25 mg once daily at night for sleep, prescribed by Glenda fields Continue taking Wellbutrin  150 mg once daily, prescribed by Glenda fields Increase Lexapro  to 20 mg once daily for depressive symptoms.  Patient has also educated to notify the provider of confusion or movements right away due to possible serotonin syndrome with the relationship between Seroquel  and Lexapro . Pt requested to consider Spravato.  Pt referral sent to Spravato provider.  - Psychotherapy: Patient requesting to participate in therapy and symptom management with this provider. - Education: Patient educated on worsening symptoms of MDD as well as PTSD, patient was shared with resources of talk  therapy and referral list in which he declined and states that he would prefer to work with a psychiatric provider and symptom management. - Follow-Up: Every 4 weeks for symptom management and therapy - Referrals: No referrals - Safety Planning: Patient currently denies SI, HI.  Patient agrees to safety plan of reaching out to the clinic should he have worsening symptoms of depression.  Patient also notified  and instructed to call 911 or go to the closest hospital should the patient experience suicidal thoughts with or without a plan.  Patient endorses is having no firearms in the home.  Patient is in agreement with safety plan  Patient/Guardian was advised Release of Information must be obtained prior to any record release in order to collaborate their care with an outside provider. Patient/Guardian was advised if they have not already done so to contact the registration department to sign all necessary forms in order for us  to release information regarding their care.   Consent: Patient/Guardian gives verbal consent for treatment and assignment of benefits for services provided during this visit. Patient/Guardian expressed understanding and agreed to proceed.    Arlana Labor, NP 03/11/2024, 11:15 AM

## 2024-03-12 ENCOUNTER — Telehealth: Payer: Self-pay

## 2024-03-12 NOTE — Telephone Encounter (Signed)
 notifie pt of what the pharmacy stated. i told him that i would call him next week to see if he received if not ill call back to the pharmacy

## 2024-03-12 NOTE — Telephone Encounter (Signed)
 called - she stated that the rx went through and is set for delivering and pt should get in 7-10 day. she could not see any issues.

## 2024-03-12 NOTE — Telephone Encounter (Signed)
 pt caled [facility] that [facility] states that they could not fill rxs because provider did not sign off on the rxs. i told pt i would call and find out what is going on.

## 2024-03-14 ENCOUNTER — Ambulatory Visit: Admitting: Podiatry

## 2024-03-14 ENCOUNTER — Ambulatory Visit (INDEPENDENT_AMBULATORY_CARE_PROVIDER_SITE_OTHER): Admitting: Podiatry

## 2024-03-14 DIAGNOSIS — L97512 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ed: Secondary | ICD-10-CM

## 2024-03-14 DIAGNOSIS — E119 Type 2 diabetes mellitus without complications: Secondary | ICD-10-CM | POA: Diagnosis not present

## 2024-03-14 NOTE — Progress Notes (Signed)
 Subjective:  Patient ID: [name], male    DOB: August 28, 1956,  MRN: [id number]  Chief Complaint  Patient presents with   Wound Check    68 y.o. male presents for wound care. Patient presents with right submetatarsal 1 ulceration with fat layer exposed.  Patient states that he has been breaking the skin down for quite some time.  He has been dealing with this from about 6 weeks.  Has been slowly getting worse he has nursing has been doing local wound care.  He denies any other acute complaints would like to discuss next treatment plan he is a diabetic with last A1c of6.2.   Review of Systems: Negative except as noted in the HPI. Denies N/V/F/Ch.  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)    Depression    Diabetes mellitus without complication (HCC)    Diabetic infection of left foot (HCC)    Dysarthria    Gout    Hemiparesis affecting left side as late effect of cerebrovascular accident (CVA) (HCC)    HLD (hyperlipidemia)    Hyponatremia    Lactic acidosis    Left hallux osteomyelitis (HCC)    Osteomyelitis of ankle or foot, acute, left (HCC)    Stage 3a chronic kidney disease (HCC)    Stroke (HCC)    Type II diabetes mellitus with renal manifestations (HCC)     Current Outpatient Medications:    allopurinol  (ZYLOPRIM ) 300 MG tablet, Take 300 mg by mouth daily., Disp: , Rfl:    atorvastatin  (LIPITOR) 80 MG tablet, Take 1 tablet (80 mg total) by mouth daily., Disp: 90 tablet, Rfl: 0   B-D UF III MINI PEN NEEDLES 31G X 5 MM MISC, Inject 1 each into the skin 3 (three) times daily., Disp: , Rfl:    BD DISP NEEDLES 18G X 1-1/2" MISC, , Disp: , Rfl:    BD PLASTIPAK SYRINGE 21G X 1" 3 ML MISC, , Disp: , Rfl:    BD PLASTIPAK SYRINGE 3 ML MISC, , Disp:  , Rfl:    BD PRECISIONGLIDE NEEDLE 23G X 1-1/2" MISC, , Disp: , Rfl:    buPROPion  (WELLBUTRIN  XL) 150 MG 24 hr tablet, Take 1 tablet (150 mg total) by mouth daily., Disp: 90 tablet, Rfl: 0   Continuous Glucose Receiver (FREESTYLE LIBRE 14 DAY READER) DEVI, Use 1 Device as directed, Disp: , Rfl:    cyanocobalamin  100 MCG tablet, Take 100 mcg by mouth daily., Disp: , Rfl:    doxycycline  (VIBRAMYCIN ) 100 MG capsule, Take 100 mg by mouth 2 (two) times daily., Disp: , Rfl:    empagliflozin (JARDIANCE) 25 MG TABS tablet, Take 25 mg by mouth daily., Disp: , Rfl:    escitalopram  (LEXAPRO ) 20 MG tablet, Take 1 tablet (20 mg total) by mouth daily., Disp: 90 tablet, Rfl: 0   gabapentin  (NEURONTIN ) 400 MG capsule, Take 400 mg by mouth 3 (three) times daily., Disp: , Rfl:    LANTUS  SOLOSTAR 100 UNIT/ML Solostar Pen, Inject 6 Units into the skin at bedtime., Disp: , Rfl:    lisinopril  (ZESTRIL ) 5 MG tablet, Take 5 mg by mouth daily., Disp: , Rfl:    metFORMIN (GLUCOPHAGE) 500 MG tablet, Take 1,000 mg by mouth 2 (two)  times daily with a meal., Disp: , Rfl:    methocarbamol  (ROBAXIN ) 500 MG tablet, Take 1 tablet (500 mg total) by mouth every 6 (six) hours as needed for muscle spasms., Disp: 120 tablet, Rfl: 0   pravastatin  (PRAVACHOL ) 80 MG tablet, Take 80 mg by mouth daily., Disp: , Rfl:    QUEtiapine  (SEROQUEL ) 25 MG tablet, Take 1 tablet (25 mg total) by mouth at bedtime., Disp: 90 tablet, Rfl: 0   rOPINIRole  (REQUIP ) 0.5 MG tablet, Take 0.5 mg by mouth at bedtime., Disp: , Rfl:    Semaglutide 14 MG TABS, Take 14 mg by mouth daily., Disp: , Rfl:    sildenafil (VIAGRA) 100 MG tablet, Take 100 mg by mouth daily as needed for erectile dysfunction., Disp: , Rfl:    testosterone cypionate (DEPOTESTOSTERONE CYPIONATE) 200 MG/ML injection, Inject into the muscle every 14 (fourteen) days., Disp: , Rfl:   Social History   Tobacco Use  Smoking Status Former   Current packs/day: 0.00   Types: Cigarettes   Quit  date: 10/07/1997   Years since quitting: 26.4  Smokeless Tobacco Never    No Known Allergies Objective:  There were no vitals filed for this visit. There is no height or weight on file to calculate BMI. Constitutional Well developed. Well nourished.  Vascular Dorsalis pedis pulses palpable bilaterally. Posterior tibial pulses palpable bilaterally. Capillary refill normal to all digits.  No cyanosis or clubbing noted. Pedal hair growth normal.  Neurologic Normal speech. Oriented to person, place, and time. Protective sensation absent  Dermatologic Wound Location: Right submetatarsal 1 ulceration with fat layer exposed.  No purulent drainage noted.  Does not probe down to deep tissue.  No cellulitis present.  No malodor present Wound Base: Mixed Granular/Fibrotic Peri-wound: Calloused Exudate: Scant/small amount Serosanguinous exudate Wound Measurements: - See above  Orthopedic: No pain to palpation either foot.   Radiographs: None Assessment:   1. Type 2 diabetes mellitus without complication, unspecified whether long term insulin  use (HCC)   2. Right foot ulcer, with fat layer exposed (HCC)    Plan:  Patient was evaluated and treated and all questions answered.  Ulcer right submetatarsal 1 ulceration with fat layer exposed -Debridement as below. -Dressed with Betadine wet-to-dry dressing, DSD. -Continue off-loading with surgical shoe. - Patient has palpable pedal pulses therefore ABIs PVRs are not indicated - Patient does not currently smoke. - Given how long the wound has been present has progressive gotten worse patient will benefit from graft application.  I will work on the approval of it.    Procedure: Excisional Debridement of Wound Tool: Sharp chisel blade/tissue nipper Rationale: Removal of non-viable soft tissue from the wound to promote healing.  Anesthesia: none Pre-Debridement Wound Measurements: 3 cm x by 2.5 cm x by 0.5 cm  Post-Debridement Wound  Measurements: 3.1 cm x by 2.6 cm x by 0.5 cm  Type of Debridement: Sharp Excisional Tissue Removed: Non-viable soft tissue Blood loss: Minimal (<50cc) Depth of Debridement: subcutaneous tissue. Technique: Sharp excisional debridement to bleeding, viable wound base.  Wound Progress: The wound has stagnant for greater than 4 weeks. Site healing conversation 7 Dressing: Dry, sterile, compression dressing. Disposition: Patient tolerated procedure well. Patient to return in 1 week for follow-up.  No follow-ups on file.

## 2024-03-22 ENCOUNTER — Telehealth: Payer: Self-pay

## 2024-03-22 NOTE — Telephone Encounter (Signed)
 left message of what was said when i called [facility]. And to call our office back with additional info.

## 2024-03-22 NOTE — Telephone Encounter (Signed)
 left message to find out what medication he was referring too.

## 2024-03-22 NOTE — Telephone Encounter (Signed)
 pt left a message that [facility] has not received his refills. pt did not leave what medication he was referring too. pt was last seen on 5-12 next appt 6-17

## 2024-03-22 NOTE — Telephone Encounter (Signed)
 called pharmacy they stated that according to their system pt  received wellbutrin  and the lexapro  on 5-17 90 day supply and the seroquel  he received on 4-26 for a 90 day supply.

## 2024-04-04 ENCOUNTER — Ambulatory Visit (INDEPENDENT_AMBULATORY_CARE_PROVIDER_SITE_OTHER): Admitting: Podiatry

## 2024-04-04 DIAGNOSIS — E119 Type 2 diabetes mellitus without complications: Secondary | ICD-10-CM | POA: Diagnosis not present

## 2024-04-04 DIAGNOSIS — L97512 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ed: Secondary | ICD-10-CM

## 2024-04-04 NOTE — Progress Notes (Signed)
 Application of Amnio-Maxx Graft: -arterial studies and/or Vascular intervention: Patient has palpable pulses therefore ABIs PVRs are deferred -Patient has severe morbidity: Type 2 diabetes with A1c of 6.2 -Patient was also nutritionally optimized and encouraged protein rich diet -Smoking cessation was discussed in extensive detail.  However patient is not a smoker  -Wound start date: January 30, 2024 -Interventions attempted: Betadine wet to dry/local wound care, offloading, immobilization  -Decision was made to use Amnionic graft since partial / full thickness skin grafts such as Theraskin can lead to scarring and wound recurrence. The clinic did not use any synthetic grafts due to higher chance of foreign body reactions /rejection. Cryo-preserved amniotic graft was not used since storage infrastructure is not available at this outpatient clinic and the patient is unwilling to go to a wound care center. Grafts requiring upfront purchase were not considered due to financial limitations of this outpatient clinic and thus only grafts available on consignment were considered. Amionitic was chosen based on various characteristics including but not limited to scaffolding properties, heavy chain hyaluronic acid, cytokines, and grown factors to expedite wound healing. \   Graft application: # 1 -The wound was cleansed with saline. Selective debridement techniques to remove devitalized tissue was performed through subcutaneous tissue with 15 blade until viable bleeding tissue was encountered. -Topical application of Bovine Helicoll graft was placed over the wound and dressed with adaptic non-adherent, gauze, kling and ACE bandage. -Product Code:Amn-24 -Expiration: 05/16/2028 -Lot# [id number]   Procedure: Excisional Debridement of Wound Tool: Sharp chisel blade/tissue nipper Rationale: Removal of non-viable soft tissue from the wound to promote healing.  Anesthesia: none Pre-Debridement Wound  Measurements: 3 cm x by 2.5 cm x by 0.5  Post-Debridement Wound Measurements: 3 cm x by 2.5 cm x by 0.5  Type of Debridement: Sharp Excisional Tissue Removed: Non-viable soft tissue Blood loss: Minimal (<50cc) Depth of Debridement: subcutaneous tissue. Technique: Sharp excisional debridement to bleeding, viable wound base.  Wound Progress: This my first application of the graft.  Will continue to clinically monitor the wound healing Dressing: Dry, sterile, compression dressing. Disposition: Patient tolerated procedure well. Patient to return in 1 week for follow-up.  -The entire graft was utilized and non discarded. Graft was used in wound described above. I folded the graft in order to maximize amount of product used for the wound and maximize benefit from scaffold properties of the amniotic graft.

## 2024-04-11 ENCOUNTER — Ambulatory Visit: Admitting: Podiatry

## 2024-04-11 DIAGNOSIS — L97512 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ed: Secondary | ICD-10-CM | POA: Diagnosis not present

## 2024-04-11 NOTE — Progress Notes (Signed)
 Application of Amnio-Maxx Graft: -arterial studies and/or Vascular intervention: Patient has palpable pulses therefore ABIs PVRs are deferred -Patient has severe morbidity: Type 2 diabetes with A1c of 6.2 -Patient was also nutritionally optimized and encouraged protein rich diet -Smoking cessation was discussed in extensive detail.  However patient is not a smoker  -Wound start date: January 30, 2024 -Interventions attempted: Betadine wet to dry/local wound care, offloading, immobilization  -Decision was made to use Amnionic graft since partial / full thickness skin grafts such as Theraskin can lead to scarring and wound recurrence. The clinic did not use any synthetic grafts due to higher chance of foreign body reactions /rejection. Cryo-preserved amniotic graft was not used since storage infrastructure is not available at this outpatient clinic and the patient is unwilling to go to a wound care center. Grafts requiring upfront purchase were not considered due to financial limitations of this outpatient clinic and thus only grafts available on consignment were considered. Amionitic was chosen based on various characteristics including but not limited to scaffolding properties, heavy chain hyaluronic acid, cytokines, and grown factors to expedite wound healing.   Graft application: # 2 -The wound was cleansed with saline. Selective debridement techniques to remove devitalized tissue was performed through subcutaneous tissue with 15 blade until viable bleeding tissue was encountered. -Topical application of Bovine Helicoll graft was placed over the wound and dressed with adaptic non-adherent, gauze, kling and ACE bandage. -Product Code:Amn-23 -Expiration: 05/31/2028 -Lot# [id number]  Procedure: Excisional Debridement of Wound Tool: Sharp chisel blade/tissue nipper Rationale: Removal of non-viable soft tissue from the wound to promote healing.  Anesthesia: none Pre-Debridement Wound  Measurements: 1.5 cm x by 1 cm x 0.3 Post-Debridement Wound Measurements: 1.5 cm x by 1 cm x 0.3 Type of Debridement: Sharp Excisional Tissue Removed: Non-viable soft tissue Blood loss: Minimal (<50cc) Depth of Debridement: subcutaneous tissue. Technique: Sharp excisional debridement to bleeding, viable wound base.  Wound Progress: The wound decreased by more than 50%  Dressing: Dry, sterile, compression dressing. Disposition: Patient tolerated procedure well. Patient to return in 1 week for follow-up.  -The entire graft was utilized and non discarded. Graft was used in wound described above. I folded the graft in order to maximize amount of product used for the wound and maximize benefit from scaffold properties of the amniotic graft.

## 2024-04-16 ENCOUNTER — Ambulatory Visit (INDEPENDENT_AMBULATORY_CARE_PROVIDER_SITE_OTHER): Admitting: Psychiatry

## 2024-04-16 ENCOUNTER — Ambulatory Visit (INDEPENDENT_AMBULATORY_CARE_PROVIDER_SITE_OTHER): Admitting: Podiatry

## 2024-04-16 ENCOUNTER — Encounter: Payer: Self-pay | Admitting: Psychiatry

## 2024-04-16 ENCOUNTER — Other Ambulatory Visit: Payer: Self-pay

## 2024-04-16 VITALS — BP 108/71 | HR 78 | Temp 95.3°F | Ht 74.0 in

## 2024-04-16 DIAGNOSIS — R0600 Dyspnea, unspecified: Secondary | ICD-10-CM | POA: Insufficient documentation

## 2024-04-16 DIAGNOSIS — N289 Disorder of kidney and ureter, unspecified: Secondary | ICD-10-CM | POA: Insufficient documentation

## 2024-04-16 DIAGNOSIS — L97512 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ed: Secondary | ICD-10-CM

## 2024-04-16 DIAGNOSIS — E1049 Type 1 diabetes mellitus with other diabetic neurological complication: Secondary | ICD-10-CM | POA: Insufficient documentation

## 2024-04-16 DIAGNOSIS — Z135 Encounter for screening for eye and ear disorders: Secondary | ICD-10-CM | POA: Insufficient documentation

## 2024-04-16 DIAGNOSIS — F331 Major depressive disorder, recurrent, moderate: Secondary | ICD-10-CM | POA: Diagnosis not present

## 2024-04-16 DIAGNOSIS — M545 Low back pain, unspecified: Secondary | ICD-10-CM | POA: Insufficient documentation

## 2024-04-16 DIAGNOSIS — N529 Male erectile dysfunction, unspecified: Secondary | ICD-10-CM | POA: Insufficient documentation

## 2024-04-16 DIAGNOSIS — N3090 Cystitis, unspecified without hematuria: Secondary | ICD-10-CM | POA: Insufficient documentation

## 2024-04-16 DIAGNOSIS — E230 Hypopituitarism: Secondary | ICD-10-CM | POA: Insufficient documentation

## 2024-04-16 DIAGNOSIS — M25511 Pain in right shoulder: Secondary | ICD-10-CM | POA: Insufficient documentation

## 2024-04-16 DIAGNOSIS — E559 Vitamin D deficiency, unspecified: Secondary | ICD-10-CM | POA: Insufficient documentation

## 2024-04-16 DIAGNOSIS — M9390 Osteochondropathy, unspecified of unspecified site: Secondary | ICD-10-CM | POA: Insufficient documentation

## 2024-04-16 DIAGNOSIS — R269 Unspecified abnormalities of gait and mobility: Secondary | ICD-10-CM | POA: Insufficient documentation

## 2024-04-16 DIAGNOSIS — R531 Weakness: Secondary | ICD-10-CM | POA: Insufficient documentation

## 2024-04-16 DIAGNOSIS — Z7729 Contact with and (suspected ) exposure to other hazardous substances: Secondary | ICD-10-CM | POA: Insufficient documentation

## 2024-04-16 DIAGNOSIS — H9190 Unspecified hearing loss, unspecified ear: Secondary | ICD-10-CM | POA: Insufficient documentation

## 2024-04-16 DIAGNOSIS — I10 Essential (primary) hypertension: Secondary | ICD-10-CM | POA: Insufficient documentation

## 2024-04-16 DIAGNOSIS — E669 Obesity, unspecified: Secondary | ICD-10-CM | POA: Insufficient documentation

## 2024-04-16 DIAGNOSIS — R262 Difficulty in walking, not elsewhere classified: Secondary | ICD-10-CM | POA: Insufficient documentation

## 2024-04-16 DIAGNOSIS — N329 Bladder disorder, unspecified: Secondary | ICD-10-CM | POA: Insufficient documentation

## 2024-04-16 DIAGNOSIS — M7541 Impingement syndrome of right shoulder: Secondary | ICD-10-CM | POA: Insufficient documentation

## 2024-04-16 DIAGNOSIS — F4310 Post-traumatic stress disorder, unspecified: Secondary | ICD-10-CM

## 2024-04-16 DIAGNOSIS — R569 Unspecified convulsions: Secondary | ICD-10-CM | POA: Insufficient documentation

## 2024-04-16 MED ORDER — ESCITALOPRAM OXALATE 20 MG PO TABS: 20.0000 mg | ORAL_TABLET | Freq: Every day | ORAL | 1 refills | Status: DC

## 2024-04-16 NOTE — Progress Notes (Signed)
 BH MD/PA/NP OP Progress Note  04/16/2024 10:59 AM [name]  MRN:  [id number]  Chief Complaint:  Chief Complaint  Patient presents with   Follow-up   HPI: 68 year old male presenting to [facility] for follow-up.  Patient continues to come in without utilizing walker the patient is observed with unsteady gait when asked about a walker patient states that he has 1 and knows the use of but would rather not.  Patient reports that he has fallen with 4 times within the last month stating that he is really uneasy on his gait but is currently waiting on a wheelchair to come from the [place].  Patient also observed with bandages to his right foot stating that he is developing sores due to putting all his weight on the foot to compensate.  Patient reports that he has not had no improvement in his mood stating that he continues to be depressed with anhedonia as well as sadness.  Patient reports that he is hoping that the [facility] treatments that he has been referred to will help and is looking forward to seeing improvement as such.  Patient reports he continues to participate in his social activities once a week with other veterans.  Patient also reports that he has been reading books and watching TV at home.  Patient has been encouraged to utilize creative arts and was encouraged to try model planes and finding other avenues of hobbies to find joy and sense of filament.  Patient agrees with this recommendation. Based on this assessment interview is recommended for the patient to continue medications of 20 mg of Lexapro  once daily.  Patient also to continue Wellbutrin  150 mg once daily.  Patient also to continue Seroquel  25 mg once daily for sleep.  Patient still endorses getting 6-7 hours of sleep at night.  No changes to medications as patient will be seeing [facility] shortly and starting on treatment with [facility] providers.  Patient is in agreement with treatment plan.  Patient has been encouraged to call the clinic  should he have worsening symptoms or have questions before the next visit.  Patient with no questions or concerns.  Patient denies SI, HI, AVH.  Patient will follow up in 1 month in person. Visit Diagnosis:    ICD-10-CM   1. PTSD (post-traumatic stress disorder)  F43.10     2. Major depressive disorder, recurrent episode, moderate (HCC)  F33.1 escitalopram  (LEXAPRO ) 20 MG tablet      Past Psychiatric History:  Previous Psych Hospitalizations:  -2023, Previously hospitalized for 4 weeks, with ECT Therapy.    Previous Dx: MDD, PTSD.   Medications Current: -Seroquel  25 mg once daily before bed. -Wellbutrin  150 mg once daily -Lexapro  20mg  once daily   Medication Trials:  - Haldol, over sedating, stopped by patient, in the [employer] - Zoloft - poor response, in the [employer] - Prozac - poor response, in the [employer] - Zyprexa - disturbed thinking, stopped by patient, in the [employer] - Completed a course of TMS in 1999.    Suicide & Violence: Denies SI, HI.  Denies having a firearm within the home   Psychotherapy: Denies and states he is not interested in in therapy.   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)    Depression    Diabetes mellitus without complication (HCC)    Diabetic infection of left foot (HCC)    Dysarthria    Gout    Hemiparesis affecting left side as late effect of cerebrovascular accident (CVA) (HCC)    HLD (hyperlipidemia)    Hyponatremia    Lactic acidosis    Left hallux osteomyelitis (HCC)    Osteomyelitis of ankle or foot, acute, left (HCC)    Stage 3a chronic kidney disease (HCC)    Stroke (HCC)    Type II diabetes mellitus with renal manifestations [facility])     Past Surgical  History:  Procedure Laterality Date   ACHILLES TENDON SURGERY Left 08/18/2022   Procedure: ACHILLES LENGTHENING/KIDNER;  Surgeon: Velma Ghazi, DPM;  Location: [facility];  Service: Podiatry;  Laterality: Left;   AMPUTATION TOE Left 12/16/2021   Procedure: AMPUTATION TOE - Left Great;  Surgeon: Angel Barba, DPM;  Location: [facility];  Service: Podiatry;  Laterality: Left;   AMPUTATION TOE Left 01/14/2022   Procedure: GREAT TOE AMPUTATION;  Surgeon: Angel Barba, DPM;  Location: [facility];  Service: Podiatry;  Laterality: Left;   APPENDECTOMY  1966   CATARACT EXTRACTION W/PHACO Left 05/24/2022   Procedure: CATARACT EXTRACTION PHACO AND INTRAOCULAR LENS PLACEMENT (IOC) LEFT 7.63 00:42.1;  Surgeon: Clair Crews, MD;  Location: [facility];  Service: Ophthalmology;  Laterality: Left;  Diabetic   CATARACT EXTRACTION W/PHACO Right 06/07/2022   Procedure: CATARACT EXTRACTION PHACO AND INTRAOCULAR LENS PLACEMENT (IOC) RIGHT;  Surgeon: Clair Crews, MD;  Location: [facility];  Service: Ophthalmology;  Laterality: Right;  7.33 0:45.3   COLONOSCOPY     LAPAROSCOPIC GASTRIC BANDING     POSTERIOR CERVICAL LAMINECTOMY Right 06/05/2023   Procedure: RIGHT MINIMALLY INVASIVE (MIS) C7-T1 DISCECTOMY;  Surgeon: Aurther Blue, MD;  Location: [facility];  Service: Neurosurgery;  Laterality: Right;   TRANSMETATARSAL AMPUTATION Left 08/18/2022   Procedure: TRANSMETATARSAL AMPUTATION;  Surgeon: Velma Ghazi, DPM;  Location: [facility];  Service: Podiatry;  Laterality: Left;   WISDOM TOOTH EXTRACTION      Family Psychiatric History: No additional  Family History: History reviewed. No pertinent family history.  Social History:  Social History   Socioeconomic History   Marital status: Single    Spouse name: Not on file   Number of children: Not on file   Years of education: Not on file   Highest education level: Not on file  Occupational History   Not on file  Tobacco Use    Smoking status: Former    Current packs/day: 0.00    Types: Cigarettes    Quit date: 10/07/1997    Years since quitting: 26.5   Smokeless tobacco: Never  Vaping Use   Vaping status: Never Used  Substance and Sexual Activity   Alcohol use: Never   Drug use: Never   Sexual activity: Not Currently  Other Topics Concern   Not on file  Social History Narrative   LIVES ALONE   Social Drivers of Health   Financial Resource Strain: Low Risk  (03/18/2024)   Received from [facility]   Overall Financial Resource Strain (CARDIA)    Difficulty of Paying Living Expenses: Not hard at all  Food Insecurity: No Food Insecurity (03/18/2024)   Received from [facility]   Hunger Vital Sign    Within the past 12 months, you worried that your food would run out before  you got the money to buy more.: Never true    Within the past 12 months, the food you bought just didn't last and you didn't have money to get more.: Never true  Transportation Needs: No Transportation Needs (03/18/2024)   Received from [facility] - Transportation    In the past 12 months, has lack of transportation kept you from medical appointments or from getting medications?: No    Lack of Transportation (Non-Medical): No  Physical Activity: Inactive (11/21/2023)   Received from [facility]   Exercise Vital Sign    On average, how many days per week do you engage in moderate to strenuous exercise (like a brisk walk)?: 0 days    On average, how many minutes do you engage in exercise at this level?: 0 min  Stress: No Stress Concern Present (11/21/2023)   Received from [facility] - Occupational Stress Questionnaire    Feeling of Stress : Only a little  Social Connections: Socially Isolated (11/21/2023)   Received from [facility]   Social Connection and Isolation Panel    In  a typical week, how many times do you talk on the phone with family, friends, or neighbors?: Never    How often do you get together with friends or relatives?: Once a week    How often do you attend church or religious services?: Never    Do you belong to any clubs or organizations such as church groups, unions, fraternal or athletic groups, or school groups?: No    How often do you attend meetings of the clubs or organizations you belong to?: Never    Are you married, widowed, divorced, separated, never married, or living with a partner?: Divorced    Allergies: No Known Allergies  Metabolic Disorder Labs: Lab Results  Component Value Date   HGBA1C 7.0 (H) 08/19/2022   MPG 154.2 08/19/2022   MPG 139.85 12/16/2021   No results found for: PROLACTIN Lab Results  Component Value Date   CHOL 130 12/06/2021   TRIG 84 12/06/2021   HDL 27 (L) 12/06/2021   CHOLHDL 4.8 12/06/2021   VLDL 17 12/06/2021   LDLCALC 86 12/06/2021   No results found for: TSH  Therapeutic Level Labs: No results found for: LITHIUM No results found for: VALPROATE No results found for: CBMZ  Current Medications: Current Outpatient Medications  Medication Sig Dispense Refill   allopurinol  (ZYLOPRIM ) 300 MG tablet Take 300 mg by mouth daily.     atorvastatin  (LIPITOR) 80 MG tablet Take 1 tablet (80 mg total) by mouth daily. 90 tablet 0   B-D UF III MINI PEN NEEDLES 31G X 5 MM MISC Inject 1 each into the skin 3 (three) times daily.     BD DISP NEEDLES 18G X 1-1/2 MISC      BD PLASTIPAK SYRINGE 21G X 1 3 ML MISC      BD PLASTIPAK SYRINGE 3 ML MISC      BD PRECISIONGLIDE NEEDLE 23G X 1-1/2 MISC      buPROPion  (WELLBUTRIN  XL) 150 MG 24 hr tablet Take 1 tablet (150 mg total) by mouth daily. 90 tablet 0   Continuous Glucose Receiver (FREESTYLE LIBRE 14 DAY READER) DEVI Use 1 Device as directed     cyanocobalamin  100 MCG tablet Take 100 mcg by mouth daily.     doxycycline  (VIBRAMYCIN ) 100 MG capsule  Take 100 mg by  mouth 2 (two) times daily.     empagliflozin (JARDIANCE) 25 MG TABS tablet Take 25 mg by mouth daily.     escitalopram  (LEXAPRO ) 20 MG tablet Take 1 tablet (20 mg total) by mouth daily. 90 tablet 0   gabapentin  (NEURONTIN ) 400 MG capsule Take 400 mg by mouth 3 (three) times daily.     LANTUS  SOLOSTAR 100 UNIT/ML Solostar Pen Inject 6 Units into the skin at bedtime.     lisinopril  (ZESTRIL ) 5 MG tablet Take 5 mg by mouth daily.     metFORMIN (GLUCOPHAGE) 500 MG tablet Take 1,000 mg by mouth 2 (two) times daily with a meal.     methocarbamol  (ROBAXIN ) 500 MG tablet Take 1 tablet (500 mg total) by mouth every 6 (six) hours as needed for muscle spasms. 120 tablet 0   pravastatin  (PRAVACHOL ) 80 MG tablet Take 80 mg by mouth daily.     QUEtiapine  (SEROQUEL ) 25 MG tablet Take 1 tablet (25 mg total) by mouth at bedtime. 90 tablet 0   rOPINIRole  (REQUIP ) 0.5 MG tablet Take 0.5 mg by mouth at bedtime.     Semaglutide 14 MG TABS Take 14 mg by mouth daily.     sildenafil (VIAGRA) 100 MG tablet Take 100 mg by mouth daily as needed for erectile dysfunction.     testosterone cypionate (DEPOTESTOSTERONE CYPIONATE) 200 MG/ML injection Inject into the muscle every 14 (fourteen) days.     No current facility-administered medications for this visit.     Musculoskeletal: Strength & Muscle Tone: decreased Gait & Station: normal, broad based Patient leans: Front  Psychiatric Specialty Exam: Review of Systems  Constitutional: Negative.   HENT: Negative.    Eyes: Negative.   Respiratory: Negative.    Cardiovascular: Negative.   Gastrointestinal: Negative.   Endocrine: Negative.   Genitourinary: Negative.   Musculoskeletal:  Positive for back pain, gait problem, joint swelling and myalgias.  Skin: Negative.   Allergic/Immunologic: Negative.   Hematological: Negative.   Psychiatric/Behavioral:  Positive for dysphoric mood.     Blood pressure 108/71, pulse 78, temperature (!) 95.3 F (35.2  C), temperature source Temporal, height 6' 2 (1.88 m).Body mass index is 30.17 kg/m.  General Appearance: Fairly Groomed  Eye Contact:  Good  Speech:  Clear and Coherent  Volume:  Normal  Mood:  Depressed  Affect:  Depressed  Thought Process:  Coherent  Orientation:  Full (Time, Place, and Person)  Thought Content: Logical   Suicidal Thoughts:  No  Homicidal Thoughts:  No  Memory:  Immediate;   Good Recent;   Good Remote;   Good  Judgement:  Good  Insight:  Good  Psychomotor Activity:  Normal  Concentration:  Concentration: Good and Attention Span: Good  Recall:  Good  Fund of Knowledge: Good  Language: Good  Akathisia:  No  Handed:  Right  AIMS (if indicated):   Assets:  Desire for Improvement Financial Resources/Insurance Housing  ADL's:  Intact  Cognition: WNL  Sleep:  Good   Screenings: GAD-7    Flowsheet Row Office Visit from 03/11/2024 in [facility]  Total GAD-7 Score 14   PHQ2-9    Flowsheet Row Office Visit from 03/11/2024 in [facility] Office Visit from 02/08/2024 in [facility] Office Visit from 01/10/2024 in [facility]  PHQ-2 Total Score 6 6 4   PHQ-9 Total Score 25 24 14    Flowsheet Row Office Visit from  03/11/2024 in [facility] Office Visit from 02/08/2024 in [facility] Office Visit from 01/10/2024 in [facility]  C-SSRS RISK CATEGORY No Risk No Risk No Risk     Assessment and Plan:   Assessment - Diagnosis: Major depressive disorder, recurrent episode, moderate (HCC) [F33.1]  2. PTSD (post-traumatic stress disorder) [F43.10]  Differential Diagnosis: Depression related to medical condition Peripheral neuropathy  - Progress: PT reports no change in his symptoms, of  depression reporting that 10mg  of Lexapro  is not improving his symptoms.   - Risk Factors: Risk for suicide identified due to major depressive disorder diagnosis.  Plan - Medications:  Continue Seroquel  25 mg once daily at night for sleep, prescribed by Glenda fields Continue taking Wellbutrin  150 mg once daily, prescribed by Glenda fields Continue Lexapro  to 20 mg once daily for depressive symptoms.  Patient has also educated to notify the provider of confusion or movements right away due to possible serotonin syndrome with the relationship between Seroquel  and Lexapro . Pt requested to consider [facility].  Pt referral sent to [facility] provider.  - Psychotherapy: Patient requesting to participate in therapy and symptom management with this provider. - Education: Patient educated on worsening symptoms of MDD as well as PTSD, patient was shared with resources of talk therapy and referral list in which he declined and states that he would prefer to work with a psychiatric provider and symptom management. - Follow-Up: Every 4 weeks for symptom management and therapy - Referrals: No referrals - Safety Planning: Patient currently denies SI, HI.  Patient agrees to safety plan of reaching out to the clinic should he have worsening symptoms of depression.  Patient also notified and instructed to call 911 or go to the closest hospital should the patient experience suicidal thoughts with or without a plan.  Patient endorses is having no firearms in the home.  Patient is in agreement with safety plan  Patient/Guardian was advised Release of Information must be obtained prior to any record release in order to collaborate their care with an outside provider. Patient/Guardian was advised if they have not already done so to contact the registration department to sign all necessary forms in order for us  to release information regarding their care.   Consent: Patient/Guardian gives verbal consent for treatment and  assignment of benefits for services provided during this visit. Patient/Guardian expressed understanding and agreed to proceed.    Arlana Labor, NP 04/16/2024, 10:59 AM

## 2024-04-16 NOTE — Progress Notes (Signed)
 Application of Amnio-Maxx Graft: -arterial studies and/or Vascular intervention: Patient has palpable pulses therefore ABIs PVRs are deferred -Patient has severe morbidity: Type 2 diabetes with A1c of 6.2 -Patient was also nutritionally optimized and encouraged protein rich diet -Smoking cessation was discussed in extensive detail.  However patient is not a smoker  -Wound start date: January 30, 2024 -Interventions attempted: Betadine wet to dry/local wound care, offloading, immobilization  -Decision was made to use Amnionic graft since partial / full thickness skin grafts such as Theraskin can lead to scarring and wound recurrence. The clinic did not use any synthetic grafts due to higher chance of foreign body reactions /rejection. Cryo-preserved amniotic graft was not used since storage infrastructure is not available at this outpatient clinic and the patient is unwilling to go to a wound care center. Grafts requiring upfront purchase were not considered due to financial limitations of this outpatient clinic and thus only grafts available on consignment were considered. Amionitic was chosen based on various characteristics including but not limited to scaffolding properties, heavy chain hyaluronic acid, cytokines, and grown factors to expedite wound healing.  Graft application: # 3 -The wound was cleansed with saline. Selective debridement techniques to remove devitalized tissue was performed through subcutaneous tissue with 15 blade until viable bleeding tissue was encountered. -Topical application of Bovine Helicoll graft was placed over the wound and dressed with adaptic non-adherent, gauze, kling and ACE bandage. -Product Code:Amn-22 -Expiration: 04/30/2028 -Lot# [id number]  Procedure: Excisional Debridement of Wound Tool: Sharp chisel blade/tissue nipper Rationale: Removal of non-viable soft tissue from the wound to promote healing.  Anesthesia: none Pre-Debridement Wound Measurements: 1.0  cm x by 0.9 cm x 0.3 Post-Debridement Wound Measurements: 1.0 cm x by 0.9 cm x 0.3 Type of Debridement: Sharp Excisional Tissue Removed: Non-viable soft tissue Blood loss: Minimal (<50cc) Depth of Debridement: subcutaneous tissue. Technique: Sharp excisional debridement to bleeding, viable wound base.  Wound Progress: The wound continues to decrease. Dressing: Dry, sterile, compression dressing. Disposition: Patient tolerated procedure well. Patient to return in 1 week for follow-up.    -The entire graft was utilized and non discarded. Graft was used in wound described above. I folded the graft in order to maximize amount of product used for the wound and maximize benefit from scaffold properties of the amniotic graft.

## 2024-04-18 ENCOUNTER — Ambulatory Visit: Admitting: Behavioral Health

## 2024-04-18 ENCOUNTER — Encounter: Payer: Self-pay | Admitting: Behavioral Health

## 2024-04-18 VITALS — BP 81/48 | HR 89 | Ht 74.0 in | Wt 225.0 lb

## 2024-04-18 DIAGNOSIS — F329 Major depressive disorder, single episode, unspecified: Secondary | ICD-10-CM

## 2024-04-18 DIAGNOSIS — F411 Generalized anxiety disorder: Secondary | ICD-10-CM | POA: Diagnosis not present

## 2024-04-18 DIAGNOSIS — F4310 Post-traumatic stress disorder, unspecified: Secondary | ICD-10-CM | POA: Diagnosis not present

## 2024-04-18 NOTE — Progress Notes (Signed)
 [facility] MD/PA/NP Initial Note  04/18/2024 4:02 PM [name]  MRN:  [id number]  Chief Complaint:  Chief Complaint   Depression; Anxiety; Establish Care; Patient Education; Trauma     HPI:  [name], 68 year old male presents to this office for initial visit and to be evaluated for the appropriateness of Spravato treatment.  He will continue care with [facility] for medication management.  Patient presents with a significant, extensive history of severe depression stemming back to early childhood.  He is currently [place] 100% disabled.  Patient assures of no previous diagnosis of bipolar disorder.  He currently rates his anxiety at 2/10, and depression at 8/10.  Patient is sleeping well with the aid of medication 7 to 8 hours per night.  He has a very lengthy prior psychiatric medication list without achieving long-term efficacy.  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  Spravato education will be provided.  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 Generalized anxiety disorder  F41.1     3. 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)    Depression    Diabetes mellitus without complication (HCC)    Diabetic infection of left foot (HCC)     Dysarthria    Gout    Hemiparesis affecting left side as late effect of cerebrovascular accident (CVA) (HCC)    HLD (hyperlipidemia)    Hyponatremia    Lactic acidosis    Left hallux osteomyelitis (HCC)    Osteomyelitis of ankle or foot, acute, left (HCC)    Stage 3a chronic kidney disease (HCC)    Stroke (HCC)    Type II diabetes mellitus with renal manifestations [facility])     Past Surgical History:  Procedure Laterality Date   ACHILLES TENDON SURGERY Left 08/18/2022   Procedure: ACHILLES LENGTHENING/KIDNER;  Surgeon: Velma Ghazi, DPM;  Location: [facility];  Service: Podiatry;  Laterality: Left;   AMPUTATION TOE Left 12/16/2021   Procedure: AMPUTATION TOE - Left Great;  Surgeon: Angel Barba, DPM;  Location: [facility];  Service: Podiatry;  Laterality: Left;   AMPUTATION TOE Left 01/14/2022   Procedure: GREAT TOE AMPUTATION;  Surgeon: Angel Barba, DPM;  Location: [facility];  Service: Podiatry;  Laterality: Left;   APPENDECTOMY  1966   CATARACT EXTRACTION W/PHACO Left 05/24/2022   Procedure: CATARACT EXTRACTION PHACO AND INTRAOCULAR LENS PLACEMENT (IOC) LEFT 7.63 00:42.1;  Surgeon: Clair Crews, MD;  Location: [facility];  Service: Ophthalmology;  Laterality: Left;  Diabetic   CATARACT EXTRACTION W/PHACO Right 06/07/2022   Procedure: CATARACT EXTRACTION PHACO AND INTRAOCULAR LENS PLACEMENT (IOC) RIGHT;  Surgeon: Clair Crews, MD;  Location: [facility];  Service: Ophthalmology;  Laterality: Right;  7.33 0:45.3   COLONOSCOPY     LAPAROSCOPIC GASTRIC BANDING     POSTERIOR CERVICAL LAMINECTOMY Right  06/05/2023   Procedure: RIGHT MINIMALLY INVASIVE (MIS) C7-T1 DISCECTOMY;  Surgeon: Aurther Blue, MD;  Location: [facility];  Service: Neurosurgery;  Laterality: Right;   TRANSMETATARSAL AMPUTATION Left 08/18/2022   Procedure: TRANSMETATARSAL AMPUTATION;  Surgeon: Velma Ghazi, DPM;  Location: [facility];  Service: Podiatry;  Laterality: Left;   WISDOM TOOTH  EXTRACTION      Family Psychiatric History: see chart  Family History:  Family History  Problem Relation Age of Onset   Paranoid behavior Sister     Social History:  Social History   Socioeconomic History   Marital status: Single    Spouse name: Not on file   Number of children: 1   Years of education: 14   Highest education level: Associate degree: occupational, Scientist, product/process development, or vocational program  Occupational History   Not on file  Tobacco Use   Smoking status: Former    Current packs/day: 0.00    Types: Cigarettes    Quit date: 10/07/1997    Years since quitting: 26.5   Smokeless tobacco: Never  Vaping Use   Vaping status: Never Used  Substance and Sexual Activity   Alcohol use: Never   Drug use: Never   Sexual activity: Not Currently  Other Topics Concern   Not on file  Social History Narrative   LIVES ALONE  in [place]     Social Drivers of Health   Financial Resource Strain: Low Risk  (03/18/2024)   Received from [facility]   Overall Financial Resource Strain (CARDIA)    Difficulty of Paying Living Expenses: Not hard at all  Food Insecurity: No Food Insecurity (03/18/2024)   Received from [facility]   Hunger Vital Sign    Within the past 12 months, you worried that your food would run out before you got the money to buy more.: Never true    Within the past 12 months, the food you bought just didn't last and you didn't have money to get more.: Never true  Transportation Needs: No Transportation Needs (03/18/2024)   Received from [facility] - Transportation    In the past 12 months, has lack of transportation kept you from medical appointments or from getting medications?: No    Lack of Transportation (Non-Medical): No  Physical Activity: Inactive (11/21/2023)   Received from [facility]   Exercise Vital Sign    On average, how many days per week do you engage in moderate  to strenuous exercise (like a brisk walk)?: 0 days    On average, how many minutes do you engage in exercise at this level?: 0 min  Stress: No Stress Concern Present (11/21/2023)   Received from [facility] - Occupational Stress Questionnaire    Feeling of Stress : Only a little  Social Connections: Socially Isolated (11/21/2023)   Received from [facility]   Social Connection and Isolation Panel    In a typical week, how many times do you talk on the phone with family, friends, or neighbors?: Never    How often do you get together with friends or relatives?: Once a week    How often do you attend church or religious services?: Never    Do you belong to any clubs or organizations such as church groups, unions, fraternal or athletic groups, or school groups?: No    How often do you attend meetings of  the clubs or organizations you belong to?: Never    Are you married, widowed, divorced, separated, never married, or living with a partner?: Divorced    Allergies: No Known Allergies  Metabolic Disorder Labs: Lab Results  Component Value Date   HGBA1C 7.0 (H) 08/19/2022   MPG 154.2 08/19/2022   MPG 139.85 12/16/2021   No results found for: PROLACTIN Lab Results  Component Value Date   CHOL 130 12/06/2021   TRIG 84 12/06/2021   HDL 27 (L) 12/06/2021   CHOLHDL 4.8 12/06/2021   VLDL 17 12/06/2021   LDLCALC 86 12/06/2021   No results found for: TSH  Therapeutic Level Labs: No results found for: LITHIUM No results found for: VALPROATE No results found for: CBMZ  Current Medications: Current Outpatient Medications  Medication Sig Dispense Refill   allopurinol  (ZYLOPRIM ) 300 MG tablet Take 300 mg by mouth daily.     ammonium lactate (LAC-HYDRIN) 12 % lotion Apply topically.     atorvastatin  (LIPITOR) 80 MG tablet Take 1 tablet (80 mg total) by mouth daily. 90 tablet 0   B-D UF III MINI PEN  NEEDLES 31G X 5 MM MISC Inject 1 each into the skin 3 (three) times daily.     BD DISP NEEDLES 18G X 1-1/2 MISC      BD PLASTIPAK SYRINGE 21G X 1 3 ML MISC      BD PLASTIPAK SYRINGE 3 ML MISC      BD PRECISIONGLIDE NEEDLE 23G X 1-1/2 MISC      buPROPion  (WELLBUTRIN  XL) 150 MG 24 hr tablet Take 1 tablet (150 mg total) by mouth daily. 90 tablet 0   Continuous Glucose Receiver (FREESTYLE LIBRE 14 DAY READER) DEVI Use 1 Device as directed     Continuous Glucose Sensor (FREESTYLE LIBRE 3 SENSOR) MISC      cyanocobalamin  100 MCG tablet Take 100 mcg by mouth daily.     doxycycline  (VIBRAMYCIN ) 100 MG capsule Take 100 mg by mouth 2 (two) times daily.     empagliflozin (JARDIANCE) 25 MG TABS tablet Take 25 mg by mouth daily.     escitalopram  (LEXAPRO ) 20 MG tablet Take 1 tablet (20 mg total) by mouth daily. 90 tablet 1   gabapentin  (NEURONTIN ) 400 MG capsule Take 400 mg by mouth 3 (three) times daily.     HYDROcodone -acetaminophen  (NORCO/VICODIN) 5-325 MG tablet Take 1 tablet by mouth.     LANTUS  SOLOSTAR 100 UNIT/ML Solostar Pen Inject 6 Units into the skin at bedtime.     lisinopril  (ZESTRIL ) 5 MG tablet Take 5 mg by mouth daily.     metFORMIN (GLUCOPHAGE) 500 MG tablet Take 1,000 mg by mouth 2 (two) times daily with a meal.     methocarbamol  (ROBAXIN ) 500 MG tablet Take 1 tablet (500 mg total) by mouth every 6 (six) hours as needed for muscle spasms. 120 tablet 0   mupirocin ointment (BACTROBAN) 2 % Apply topically 3 (three) times daily.     pravastatin  (PRAVACHOL ) 80 MG tablet Take 80 mg by mouth daily.     pregabalin (LYRICA) 200 MG capsule Take 200 mg by mouth 2 (two) times daily.     QUEtiapine  (SEROQUEL ) 25 MG tablet Take 1 tablet (25 mg total) by mouth at bedtime. 90 tablet 0   rOPINIRole  (REQUIP ) 0.5 MG tablet Take 0.5 mg by mouth at bedtime.     Semaglutide 14 MG TABS Take 14 mg by mouth daily.     sildenafil (VIAGRA) 100 MG tablet Take 100  mg by mouth daily as needed for erectile  dysfunction.     testosterone cypionate (DEPOTESTOSTERONE CYPIONATE) 200 MG/ML injection Inject into the muscle every 14 (fourteen) days.     triamcinolone ointment (KENALOG) 0.1 % Apply topically 2 (two) times daily.     No current facility-administered medications for this visit.    Medication Side Effects: none  Orders placed this visit:  No orders of the defined types were placed in this encounter.   Psychiatric Specialty Exam:  Review of Systems  Constitutional: Negative.   Allergic/Immunologic: Negative.   Neurological: Negative.   Psychiatric/Behavioral:  Positive for dysphoric mood.     Blood pressure (!) 81/48, pulse 89, height 6' 2 (1.88 m), weight 225 lb (102.1 kg).Body mass index is 28.89 kg/m.  General Appearance: Casual and Neat unstable ambulation  Eye Contact:  Good  Speech:  Clear and Coherent  Volume:  Normal  Mood:  Depressed and Dysphoric  Affect:  Congruent and Depressed  Thought Process:  Coherent  Orientation:  Full (Time, Place, and Person)  Thought Content: Logical   Suicidal Thoughts:  No  Homicidal Thoughts:  No  Memory:  WNL  Judgement:  Good  Insight:  Good  Psychomotor Activity:  Normal  Concentration:  Concentration: Good  Recall:  Good  Fund of Knowledge: Good  Language: Good  Assets:  Desire for Improvement  ADL's:  Intact  Cognition: WNL  Prognosis:  Good   Screenings:  PHQ2-9    Flowsheet Row Office Visit from 04/18/2024 in [facility]  PHQ-2 Total Score 6  PHQ-9 Total Score 18   Flowsheet Row Admission (Discharged) from 06/05/2023 in [facility] ([facility] ED from 05/15/2023 in [facility] Department at [facility] ED from [facility]/14/2023 in [facility] at [facility]  C-SSRS RISK CATEGORY No Risk No Risk No Risk    Receiving Psychotherapy: No   Treatment Plan/Recommendations:   Greater than 50% of 60 min face to face time with  patient was spent on counseling and coordination of care. Evaluated for appropriateness of Sprivato  treatment for Treatment Resistant Depression. Patient assures me of no previous diagnosis of Bipolar disorder.  He is currently and will remain under the care of [facility] for medication management and care.   We also discussed long and complex history of depression stemming back to early childhood.  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 We discussed and reviewed extensive past psychiatric medication trial list.   We agreed to day to Sprivato treatment. Sprivato education was provided by Alva Auer and billing reviewed by Crist Dominion office manger.   Will schedule as appropriate    Lincoln Renshaw, NP

## 2024-04-23 ENCOUNTER — Ambulatory Visit (INDEPENDENT_AMBULATORY_CARE_PROVIDER_SITE_OTHER): Admitting: Podiatry

## 2024-04-23 DIAGNOSIS — L97512 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ed: Secondary | ICD-10-CM | POA: Diagnosis not present

## 2024-04-23 NOTE — Progress Notes (Signed)
 Application of Amnio-Maxx Graft: -arterial studies and/or Vascular intervention: Patient has palpable pulses therefore ABIs PVRs are deferred -Patient has severe morbidity: Type 2 diabetes with A1c of 6.2 -Patient was also nutritionally optimized and encouraged protein rich diet -Smoking cessation was discussed in extensive detail.  However patient is not a smoker  -Wound start date: January 30, 2024 -Interventions attempted: Betadine wet to dry/local wound care, offloading, immobilization  -Decision was made to use Amnionic graft since partial / full thickness skin grafts such as Theraskin can lead to scarring and wound recurrence. The clinic did not use any synthetic grafts due to higher chance of foreign body reactions /rejection. Cryo-preserved amniotic graft was not used since storage infrastructure is not available at this outpatient clinic and the patient is unwilling to go to a wound care center. Grafts requiring upfront purchase were not considered due to financial limitations of this outpatient clinic and thus only grafts available on consignment were considered. Amionitic was chosen based on various characteristics including but not limited to scaffolding properties, heavy chain hyaluronic acid, cytokines, and grown factors to expedite wound healing.  Graft application: # 4 -The wound was cleansed with saline. Selective debridement techniques to remove devitalized tissue was performed through subcutaneous tissue with 15 blade until viable bleeding tissue was encountered. -Topical application of Bovine Helicoll graft was placed over the wound and dressed with adaptic non-adherent, gauze, kling and ACE bandage. -Product Code:Amn-22 -Expiration: 04/27/2028 -Lot# [id number]  Procedure: Excisional Debridement of Wound Tool: Sharp chisel blade/tissue nipper Rationale: Removal of non-viable soft tissue from the wound to promote healing.  Anesthesia: none Pre-Debridement Wound Measurements: 0.8  cm x by 0.8 cm x 0.3 Post-Debridement Wound Measurements: 0.8 cm x by 0.8 cm x 0.3 Type of Debridement: Sharp Excisional Tissue Removed: Non-viable soft tissue Blood loss: Minimal (<50cc) Depth of Debridement: subcutaneous tissue. Technique: Sharp excisional debridement to bleeding, viable wound base.  Wound Progress: The wound continues to decrease. Dressing: Dry, sterile, compression dressing. Disposition: Patient tolerated procedure well. Patient to return in 1 week for follow-up.    -The entire graft was utilized and non discarded. Graft was used in wound described above. I folded the graft in order to maximize amount of product used for the wound and maximize benefit from scaffold properties of the amniotic graft.

## 2024-04-30 ENCOUNTER — Ambulatory Visit: Admitting: Podiatry

## 2024-04-30 DIAGNOSIS — L97512 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ed: Secondary | ICD-10-CM | POA: Diagnosis not present

## 2024-04-30 NOTE — Progress Notes (Signed)
 Application of Amnio-Maxx Graft: -arterial studies and/or Vascular intervention: Patient has palpable pulses therefore ABIs PVRs are deferred -Patient has severe morbidity: Type 2 diabetes with A1c of 6.2 -Patient was also nutritionally optimized and encouraged protein rich diet -Smoking cessation was discussed in extensive detail.  However patient is not a smoker  -Wound start date: January 30, 2024 -Interventions attempted: Betadine wet to dry/local wound care, offloading, immobilization  -Decision was made to use Amnionic graft since partial / full thickness skin grafts such as Theraskin can lead to scarring and wound recurrence. The clinic did not use any synthetic grafts due to higher chance of foreign body reactions /rejection. Cryo-preserved amniotic graft was not used since storage infrastructure is not available at this outpatient clinic and the patient is unwilling to go to a wound care center. Grafts requiring upfront purchase were not considered due to financial limitations of this outpatient clinic and thus only grafts available on consignment were considered. Amionitic was chosen based on various characteristics including but not limited to scaffolding properties, heavy chain hyaluronic acid, cytokines, and grown factors to expedite wound healing.  Graft application: # 5 -The wound was cleansed with saline. Selective debridement techniques to remove devitalized tissue was performed through subcutaneous tissue with 15 blade until viable bleeding tissue was encountered. -Topical application of Bovine Helicoll graft was placed over the wound and dressed with adaptic non-adherent, gauze, kling and ACE bandage. -Product Code:Amn-22 -Expiration: 05/01/2028 -Lot# [id number]  Procedure: Excisional Debridement of Wound Tool: Sharp chisel blade/tissue nipper Rationale: Removal of non-viable soft tissue from the wound to promote healing.  Anesthesia: none Pre-Debridement Wound Measurements: 0.6  cm x by 0.6 cm x 0.3 Post-Debridement Wound Measurements: 0.6 cm x by 0.6 cm x 0.3 Type of Debridement: Sharp Excisional Tissue Removed: Non-viable soft tissue Blood loss: Minimal (<50cc) Depth of Debridement: subcutaneous tissue. Technique: Sharp excisional debridement to bleeding, viable wound base.  Wound Progress: The wound continues to decrease slowly due to it being a weightbearing surface. Dressing: Dry, sterile, compression dressing. Disposition: Patient tolerated procedure well. Patient to return in 1 week for follow-up.    -The entire graft was utilized and non discarded. Graft was used in wound described above. I folded the graft in order to maximize amount of product used for the wound and maximize benefit from scaffold properties of the amniotic graft.

## 2024-05-09 ENCOUNTER — Ambulatory Visit (INDEPENDENT_AMBULATORY_CARE_PROVIDER_SITE_OTHER): Admitting: Podiatry

## 2024-05-09 DIAGNOSIS — L97512 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ed: Secondary | ICD-10-CM

## 2024-05-09 NOTE — Progress Notes (Signed)
 Application of Amnio-Maxx Graft: -arterial studies and/or Vascular intervention: Patient has palpable pulses therefore ABIs PVRs are deferred -Patient has severe morbidity: Type 2 diabetes with A1c of 6.2 -Patient was also nutritionally optimized and encouraged protein rich diet -Smoking cessation was discussed in extensive detail.  However patient is not a smoker  -Wound start date: January 30, 2024 -Interventions attempted: Betadine wet to dry/local wound care, offloading, immobilization  -Decision was made to use Amnionic graft since partial / full thickness skin grafts such as Theraskin can lead to scarring and wound recurrence. The clinic did not use any synthetic grafts due to higher chance of foreign body reactions /rejection. Cryo-preserved amniotic graft was not used since storage infrastructure is not available at this outpatient clinic and the patient is unwilling to go to a wound care center. Grafts requiring upfront purchase were not considered due to financial limitations of this outpatient clinic and thus only grafts available on consignment were considered. Amionitic was chosen based on various characteristics including but not limited to scaffolding properties, heavy chain hyaluronic acid, cytokines, and grown factors to expedite wound healing.  Graft application: # 6 -The wound was cleansed with saline. Selective debridement techniques to remove devitalized tissue was performed through subcutaneous tissue with 15 blade until viable bleeding tissue was encountered. -Topical application of Bovine Helicoll graft was placed over the wound and dressed with adaptic non-adherent, gauze, kling and ACE bandage. -Product Code:Amn-22 -Expiration: 03/15/2029 -Lot# [id number]    Procedure: Excisional Debridement of Wound Tool: Sharp chisel blade/tissue nipper Rationale: Removal of non-viable soft tissue from the wound to promote healing.  Anesthesia: none Pre-Debridement Wound  Measurements: 0.5 cm x by 0.5 cm x 0.3 Post-Debridement Wound Measurements: 0.5 cm x by 0.5 cm x 0.3 Type of Debridement: Sharp Excisional Tissue Removed: Non-viable soft tissue Blood loss: Minimal (<50cc) Depth of Debridement: subcutaneous tissue. Technique: Sharp excisional debridement to bleeding, viable wound base.  Wound Progress: The wound continues to decrease slowly due to it being a weightbearing surface. Dressing: Dry, sterile, compression dressing. Disposition: Patient tolerated procedure well. Patient to return in 1 week for follow-up.    -The entire graft was utilized and non discarded. Graft was used in wound described above. I folded the graft in order to maximize amount of product used for the wound and maximize benefit from scaffold properties of the amniotic graft.

## 2024-05-14 ENCOUNTER — Ambulatory Visit: Admitting: Psychiatry

## 2024-05-14 ENCOUNTER — Encounter: Payer: Self-pay | Admitting: Psychiatry

## 2024-05-14 VITALS — BP 118/68 | HR 94 | Temp 98.3°F

## 2024-05-14 DIAGNOSIS — F331 Major depressive disorder, recurrent, moderate: Secondary | ICD-10-CM | POA: Diagnosis not present

## 2024-05-14 DIAGNOSIS — F411 Generalized anxiety disorder: Secondary | ICD-10-CM

## 2024-05-14 DIAGNOSIS — F329 Major depressive disorder, single episode, unspecified: Secondary | ICD-10-CM

## 2024-05-14 DIAGNOSIS — F4310 Post-traumatic stress disorder, unspecified: Secondary | ICD-10-CM | POA: Diagnosis not present

## 2024-05-14 NOTE — Progress Notes (Signed)
 BH MD/PA/NP OP Progress Note  05/14/2024 11:33 AM [name]  MRN:  [id number]  Chief Complaint:  Chief Complaint  Patient presents with   Follow-up   HPI: 68 year old male presenting to [facility] for follow-up.  Patient reports that he continues to have his weekly meetings with his manage group from high school graduates and states that they are currently working on a community project and improving assigned.  Patient reports that the [employer] has send him a electric wheelchair but states that his short circuited shortly after getting delivered and is now sitting early in his living room without any use as it will not turn on.  Patient walked into this appointment without his walker again and when asking about why he is not using a walker he states that he just does not feel like it and knows he should.  Patient reports that he is currently taking a day by date with no clear depression or anxiety and stating that he is doing well but has been busy with appointments.  Patient reports that currently he has tried to get the [employer] to fix his wheelchair with not much progress.  He also reports that he just continues his week by week schedule in which he admitted meets with his men's group with this his social group and then just takes care in his home with no other plans.  Patient reports he is satisfied with this current lifestyle stating that he is trying to get the wheelchair in place so that he could have much more mobility.  Patient states no medication changes are necessary at this time and he is satisfied with the symptom management of medications.  Based on this assessment interview is recommended for patient to continue medication management, patient will follow up in 1 month for symptom management, psychotherapy.  Patient with no other questions or concerns at this time.  Patient in agreement with treatment plan.  Patient denies SI, HI, AVH.  I personally spent a total of 30 minutes in the care of the patient  today including preparing to see the patient, getting/reviewing separately obtained history, performing a medically appropriate exam/evaluation, and counseling and educating.  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 Generalized anxiety disorder  F41.1     3. PTSD (post-traumatic stress disorder)  F43.10     4. Major depressive disorder, recurrent episode, moderate (HCC)  F33.1       Past Psychiatric History:  Previous Psych Hospitalizations:  -2023, Previously hospitalized for 4 weeks, with ECT Therapy.    Previous Dx: MDD, PTSD.   Medications Current: -Seroquel  25 mg once daily before bed. -Wellbutrin  150 mg once daily -Lexapro  20mg  once daily   Medication Trials:  - Haldol, over sedating, stopped by patient, in the [employer] - Zoloft - poor response, in the [employer] - Prozac - poor response, in the [employer] - Zyprexa - disturbed thinking, stopped by patient, in the [employer] - Completed a course of TMS in 1999.    Suicide & Violence: Denies SI, HI.  Denies having a firearm within the home   Psychotherapy: Denies and states he is not interested in in therapy.   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)    Depression    Diabetes mellitus without complication (HCC)    Diabetic infection of left foot (HCC)    Dysarthria    Gout    Hemiparesis affecting left side as late effect of cerebrovascular accident (CVA) (HCC)    HLD (hyperlipidemia)    Hyponatremia    Lactic acidosis    Left hallux osteomyelitis (HCC)    Osteomyelitis of ankle or foot, acute, left (HCC)    Stage 3a chronic kidney disease (HCC)    Stroke (HCC)    Type II diabetes mellitus with  renal manifestations [facility])     Past Surgical History:  Procedure Laterality Date   ACHILLES TENDON SURGERY Left 08/18/2022   Procedure: ACHILLES LENGTHENING/KIDNER;  Surgeon: Tobie Franky SQUIBB, DPM;  Location: [facility] ORS;  Service: Podiatry;  Laterality: Left;   AMPUTATION TOE Left 12/16/2021   Procedure: AMPUTATION TOE - Left Great;  Surgeon: Neill Boas, DPM;  Location: [facility];  Service: Podiatry;  Laterality: Left;   AMPUTATION TOE Left 01/14/2022   Procedure: GREAT TOE AMPUTATION;  Surgeon: Neill Boas, DPM;  Location: [facility] OR[facility];  Service: Podiatry;  Laterality: Left;   APPENDECTOMY  1966   CATARACT EXTRACTION W/PHACO Left 05/24/2022   Procedure: CATARACT EXTRACTION PHACO AND INTRAOCULAR LENS PLACEMENT (IOC) LEFT 7.63 00:42.1;  Surgeon: Jaye Fallow, MD;  Location: [facility];  Service: Ophthalmology;  Laterality: Left;  Diabetic   CATARACT EXTRACTION W/PHACO Right 06/07/2022   Procedure: CATARACT EXTRACTION PHACO AND INTRAOCULAR LENS PLACEMENT (IOC) RIGHT;  Surgeon: Jaye Fallow, MD;  Location: [facility];  Service: Ophthalmology;  Laterality: Right;  7.33 0:45.3   COLONOSCOPY     LAPAROSCOPIC GASTRIC BANDING     POSTERIOR CERVICAL LAMINECTOMY Right 06/05/2023   Procedure: RIGHT MINIMALLY INVASIVE (MIS) C7-T1 DISCECTOMY;  Surgeon: Claudene Penne Lin, MD;  Location: [facility] OR[facility];  Service: Neurosurgery;  Laterality: Right;   TRANSMETATARSAL AMPUTATION Left 08/18/2022   Procedure: TRANSMETATARSAL AMPUTATION;  Surgeon: Tobie Franky SQUIBB, DPM;  Location: [facility];  Service: Podiatry;  Laterality: Left;   WISDOM TOOTH EXTRACTION      Family Psychiatric History: No additional  Family History:  Family History  Problem Relation Age of Onset   Paranoid behavior Sister     Social History:  Social History   Socioeconomic History   Marital status: Single    Spouse name: Not on file   Number of children: 1   Years of education: 14   Highest education level: Associate  degree: occupational, Scientist, product/process development, or vocational program  Occupational History   Not on file  Tobacco Use   Smoking status: Former    Current packs/day: 0.00    Types: Cigarettes    Quit date: 10/07/1997    Years since quitting: 26.6   Smokeless tobacco: Never  Vaping Use   Vaping status: Never Used  Substance and Sexual Activity   Alcohol use: Never   Drug use: Never   Sexual activity: Not Currently  Other Topics Concern   Not on file  Social History Narrative   LIVES ALONE  in [place]     Social Drivers of Health   Financial Resource Strain: Low Risk  (03/18/2024)   [facility]   Overall Financial Resource Strain (CARDIA)    Difficulty of Paying Living Expenses: Not hard at all  Food Insecurity: No Food Insecurity (03/18/2024)   [facility] from [facility]   Hunger Vital Sign    Within the past 12 months, you worried that your food would run out before you got the money to buy more.: Never true    Within the past 12 months, the food you bought just didn't last and you didn't have money to get more.: Never true  Transportation Needs: No Transportation Needs (03/18/2024)   Received from [facility] - Transportation    In the past 12 months, has lack of transportation kept you from medical appointments or from getting medications?: No    Lack of Transportation (Non-Medical): No  Physical Activity: Inactive (11/21/2023)   Received from [facility]   Exercise Vital Sign    On average, how many days per week do you engage in moderate to strenuous exercise (like a brisk walk)?: 0 days    On average, how many minutes do you engage in exercise at this level?: 0 min  Stress: No Stress Concern Present (11/21/2023)   Received from [facility] - Occupational Stress Questionnaire    Feeling of Stress : Only a little  Social  Connections: Socially Isolated (11/21/2023)   Received from [facility]   Social Connection and Isolation Panel    In a typical week, how many times do you talk on the phone with family, friends, or neighbors?: Never    How often do you get together with friends or relatives?: Once a week    How often do you attend church or religious services?: Never    Do you belong to any clubs or organizations such as church groups, unions, fraternal or athletic groups, or school groups?: No    How often do you attend meetings of the clubs or organizations you belong to?: Never    Are you married, widowed, divorced, separated, never married, or living with a partner?: Divorced    Allergies: No Known Allergies  Metabolic Disorder Labs: Lab Results  Component Value Date   HGBA1C 7.0 (H) 08/19/2022   MPG 154.2 08/19/2022   MPG 139.85 12/16/2021   No results found for: PROLACTIN Lab Results  Component Value Date   CHOL 130 12/06/2021   TRIG 84 12/06/2021   HDL 27 (L) 12/06/2021   CHOLHDL 4.8 12/06/2021   VLDL 17 12/06/2021   LDLCALC 86 12/06/2021   No results found for: TSH  Therapeutic Level Labs: No results found for: LITHIUM No results found for: VALPROATE No results found for: CBMZ  Current Medications: Current Outpatient Medications  Medication Sig Dispense Refill   allopurinol  (ZYLOPRIM ) 300 MG tablet Take 300 mg by mouth daily.     atorvastatin  (LIPITOR) 80 MG tablet Take 1 tablet (80 mg total) by mouth daily. 90 tablet 0   B-D UF III MINI PEN NEEDLES 31G X 5 MM MISC Inject 1 each into the skin 3 (three) times daily.     BD DISP NEEDLES 18G X 1-1/2 MISC      BD PLASTIPAK SYRINGE 21G X 1 3 ML MISC      BD PLASTIPAK SYRINGE 3 ML MISC      BD PRECISIONGLIDE NEEDLE 23G X 1-1/2 MISC      buPROPion  (WELLBUTRIN  XL) 150 MG 24 hr tablet Take 1 tablet (150 mg total) by mouth daily. 90 tablet 0   Continuous Glucose Receiver (FREESTYLE LIBRE 14 DAY READER) DEVI  Use 1 Device as directed  Continuous Glucose Sensor (FREESTYLE LIBRE 3 SENSOR) MISC      cyanocobalamin  100 MCG tablet Take 100 mcg by mouth daily.     doxycycline  (VIBRAMYCIN ) 100 MG capsule Take 100 mg by mouth 2 (two) times daily.     empagliflozin (JARDIANCE) 25 MG TABS tablet Take 25 mg by mouth daily.     escitalopram  (LEXAPRO ) 20 MG tablet Take 1 tablet (20 mg total) by mouth daily. 90 tablet 1   gabapentin  (NEURONTIN ) 400 MG capsule Take 400 mg by mouth 3 (three) times daily.     LANTUS  SOLOSTAR 100 UNIT/ML Solostar Pen Inject 6 Units into the skin at bedtime.     lisinopril  (ZESTRIL ) 5 MG tablet Take 5 mg by mouth daily.     metFORMIN (GLUCOPHAGE) 500 MG tablet Take 1,000 mg by mouth 2 (two) times daily with a meal.     methocarbamol  (ROBAXIN ) 500 MG tablet Take 1 tablet (500 mg total) by mouth every 6 (six) hours as needed for muscle spasms. 120 tablet 0   pravastatin  (PRAVACHOL ) 80 MG tablet Take 80 mg by mouth daily.     pregabalin (LYRICA) 200 MG capsule Take 200 mg by mouth 2 (two) times daily.     QUEtiapine  (SEROQUEL ) 25 MG tablet Take 1 tablet (25 mg total) by mouth at bedtime. 90 tablet 0   rOPINIRole  (REQUIP ) 0.5 MG tablet Take 0.5 mg by mouth at bedtime.     Semaglutide 14 MG TABS Take 14 mg by mouth daily.     sildenafil (VIAGRA) 100 MG tablet Take 100 mg by mouth daily as needed for erectile dysfunction.     testosterone cypionate (DEPOTESTOSTERONE CYPIONATE) 200 MG/ML injection Inject into the muscle every 14 (fourteen) days.     ammonium lactate (LAC-HYDRIN) 12 % lotion Apply topically.     HYDROcodone -acetaminophen  (NORCO/VICODIN) 5-325 MG tablet Take 1 tablet by mouth.     mupirocin ointment (BACTROBAN) 2 % Apply topically 3 (three) times daily.     triamcinolone ointment (KENALOG) 0.1 % Apply topically 2 (two) times daily.     No current facility-administered medications for this visit.     Musculoskeletal: Strength & Muscle Tone: within normal limits Gait &  Station: normal Patient leans: N/A  Psychiatric Specialty Exam: Review of Systems  Constitutional: Negative.   HENT: Negative.    Eyes: Negative.   Respiratory: Negative.    Cardiovascular: Negative.   Gastrointestinal: Negative.   Endocrine: Negative.   Genitourinary: Negative.   Musculoskeletal: Negative.   Skin: Negative.   Allergic/Immunologic: Negative.   Neurological: Negative.   Hematological: Negative.   Psychiatric/Behavioral:  Positive for dysphoric mood. The patient is nervous/anxious.     Blood pressure 118/68, pulse 94, temperature 98.3 F (36.8 C), temperature source Temporal, SpO2 97%.There is no height or weight on file to calculate BMI.  General Appearance: Well Groomed  Eye Contact:  Good  Speech:  Clear and Coherent  Volume:  Normal  Mood:  Euthymic  Affect:  Appropriate  Thought Process:  Coherent  Orientation:  Full (Time, Place, and Person)  Thought Content: WDL   Suicidal Thoughts:  No  Homicidal Thoughts:  No  Memory:  Immediate;   Good Recent;   Good  Judgement:  Good  Insight:  Good  Psychomotor Activity:  Normal  Concentration:  Concentration: Good and Attention Span: Good  Recall:  Good  Fund of Knowledge: Good  Language: Good  Akathisia:  No  Handed:  Right  AIMS (if indicated):  Assets:  Desire for Improvement Financial Resources/Insurance Housing  ADL's:  Intact  Cognition: WNL  Sleep:  Good   Screenings: GAD-7    Flowsheet Row Office Visit from 04/16/2024 in [facility] Office Visit from 03/11/2024 in [facility]  Total GAD-7 Score 14 14   PHQ2-9    Flowsheet Row Office Visit from 04/16/2024 in [facility] Office Visit from 03/11/2024 in [facility] Office Visit from 02/08/2024 in [facility] Office Visit from 01/10/2024 in [facility]  PHQ-2 Total Score 6 6 6 4   PHQ-9 Total Score 24 25 24 14    Flowsheet Row Office Visit from 04/16/2024 in [facility] Office Visit from 03/11/2024 in [facility] Office Visit from 02/08/2024 in [facility]  C-SSRS RISK CATEGORY No Risk No Risk No Risk     Assessment and Plan:  Assessment - Diagnosis: Major depressive disorder, recurrent episode, moderate (HCC) [F33.1]  2. PTSD (post-traumatic stress disorder) [F43.10]  Differential Diagnosis: Depression related to medical condition Peripheral neuropathy  - Progress: PT reports no change in his symptoms, of depression reporting that 10mg  of Lexapro  is not improving his symptoms.   - Risk Factors: Risk for suicide identified due to major depressive disorder diagnosis.  Plan - Medications:  Continue Seroquel  25 mg once daily at night for sleep, prescribed by Glenda fields Continue taking Wellbutrin  150 mg once daily, prescribed by Glenda fields Continue Lexapro  to 20 mg once daily for depressive symptoms.  Patient has also educated to notify the provider of confusion or movements right away due to possible serotonin syndrome with the relationship between Seroquel  and Lexapro . Pt requested to consider Spravato.  Pt referral sent to Spravato provider.  - Psychotherapy: Patient requesting to participate in therapy and symptom management with this provider. - Education: Patient educated on worsening symptoms of MDD as well as PTSD, patient was shared with resources of talk therapy and referral list in which he declined and states that he would prefer to work with a psychiatric provider and symptom management. - Follow-Up: Every 4 weeks for symptom management and therapy - Referrals: No referrals - Safety Planning: Patient currently denies SI, HI.  Patient agrees to safety plan  of reaching out to the clinic should he have worsening symptoms of depression.  Patient also notified and instructed to call 911 or go to the closest hospital should the patient experience suicidal thoughts with or without a plan.  Patient endorses is having no firearms in the home.  Patient is in agreement with safety plan  Patient/Guardian was advised Release of Information must be obtained prior to any record release in order to collaborate their care with an outside provider. Patient/Guardian was advised if they have not already done so to contact the registration department to sign all necessary forms in order for us  to release information regarding their care.   Consent: Patient/Guardian gives verbal consent for treatment and assignment of benefits for services provided during this visit. Patient/Guardian expressed understanding and agreed to proceed.    Dorn Jama Der, NP 05/14/2024, 11:33 AM

## 2024-05-16 ENCOUNTER — Ambulatory Visit (INDEPENDENT_AMBULATORY_CARE_PROVIDER_SITE_OTHER): Admitting: Podiatry

## 2024-05-16 DIAGNOSIS — L97512 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ed: Secondary | ICD-10-CM | POA: Diagnosis not present

## 2024-05-16 NOTE — Progress Notes (Signed)
 Application of Amnio-Maxx Graft: -arterial studies and/or Vascular intervention: Patient has palpable pulses therefore ABIs PVRs are deferred -Patient has severe morbidity: Type 2 diabetes with A1c of 6.2 -Patient was also nutritionally optimized and encouraged protein rich diet -Smoking cessation was discussed in extensive detail.  However patient is not a smoker  -Wound start date: January 30, 2024 -Interventions attempted: Betadine wet to dry/local wound care, offloading, immobilization  -Decision was made to use Amnionic graft since partial / full thickness skin grafts such as Theraskin can lead to scarring and wound recurrence. The clinic did not use any synthetic grafts due to higher chance of foreign body reactions /rejection. Cryo-preserved amniotic graft was not used since storage infrastructure is not available at this outpatient clinic and the patient is unwilling to go to a wound care center. Grafts requiring upfront purchase were not considered due to financial limitations of this outpatient clinic and thus only grafts available on consignment were considered. Amionitic was chosen based on various characteristics including but not limited to scaffolding properties, heavy chain hyaluronic acid, cytokines, and grown factors to expedite wound healing.  Graft application: # 7 -The wound was cleansed with saline. Selective debridement techniques to remove devitalized tissue was performed through subcutaneous tissue with 15 blade until viable bleeding tissue was encountered. -Topical application of Bovine Helicoll graft was placed over the wound and dressed with adaptic non-adherent, gauze, kling and ACE bandage. -Product Code:Amn-22 -Expiration: 03/13/2029 -Lot# [id number]    Procedure: Excisional Debridement of Wound Tool: Sharp chisel blade/tissue nipper Rationale: Removal of non-viable soft tissue from the wound to promote healing.  Anesthesia: none Pre-Debridement Wound  Measurements: 0.4 cm x by 0.4 cm x 0.3 Post-Debridement Wound Measurements: 0.4 cm x by 0.4 cm x 0.3 Type of Debridement: Sharp Excisional Tissue Removed: Non-viable soft tissue Blood loss: Minimal (<50cc) Depth of Debridement: subcutaneous tissue. Technique: Sharp excisional debridement to bleeding, viable wound base.  Wound Progress: The wound continues to decrease slowly due to it being a weightbearing surface. Dressing: Dry, sterile, compression dressing. Disposition: Patient tolerated procedure well. Patient to return in 1 week for follow-up.    -The entire graft was utilized and non discarded. Graft was used in wound described above. I folded the graft in order to maximize amount of product used for the wound and maximize benefit from scaffold properties of the amniotic graft.

## 2024-05-21 ENCOUNTER — Ambulatory Visit

## 2024-05-21 ENCOUNTER — Ambulatory Visit (INDEPENDENT_AMBULATORY_CARE_PROVIDER_SITE_OTHER): Admitting: Behavioral Health

## 2024-05-21 VITALS — BP 99/65 | HR 65

## 2024-05-21 DIAGNOSIS — F329 Major depressive disorder, single episode, unspecified: Secondary | ICD-10-CM | POA: Diagnosis not present

## 2024-05-21 NOTE — Progress Notes (Signed)
 [name] [id number] May 17, 1956 69 y.o.  Subjective:   Patient ID:  [name] is a 68 y.o. (DOB [birth year]) male.  Chief Complaint: No chief complaint on file.   HPI [name] presents to the office today for follow-up of  treatment resistant depression (TRD).   Spravato treatment    Patient was administered Spravato 54 mg intranasally today. Patient was observed by provider throughout Spravato treatment. The patient experienced the typical dissociation which gradually resolved over the 2-hour period of observation. There were no complications. Specifically, the patient did not have any untoward side effects - feeling disconnected from themself, their thoughts, feelings and things around them, dizziness, nausea, feeling sleepy, decreased feeling of sensitivity (numbness) spinning sensation, feeling anxious, lack of energy, increased blood pressure, feeling happy or very excited, or headache. Blood pressures remained within normal ranges at the 40-minute and 2-hour follow-up intervals. By the time the 2-hour observation period was met the patient was alert and oriented and able to exit without assistance. Patient willing to continue Spravato administration for the treatment of resistant depression.    PHQ2-9    Flowsheet Row Office Visit from 04/18/2024 in [facility]  PHQ-2 Total Score 6  PHQ-9 Total Score 18   Flowsheet Row Admission (Discharged) from 06/05/2023 in [facility] from 05/15/2023 in [facility] Emergency Department at [facility] ED from 01/14/2023 in [facility] Emergency Department at [facility]  C-SSRS RISK CATEGORY No Risk No Risk No Risk     Review of Systems:  Review of Systems  Constitutional: Negative.   Allergic/Immunologic: Negative.   Neurological: Negative.   Psychiatric/Behavioral:  Positive for dysphoric mood.     Medications: I have reviewed the patient's current  medications.  Current Outpatient Medications  Medication Sig Dispense Refill   allopurinol  (ZYLOPRIM ) 300 MG tablet Take 300 mg by mouth daily.     ammonium lactate (LAC-HYDRIN) 12 % lotion Apply topically.     atorvastatin  (LIPITOR) 80 MG tablet Take 1 tablet (80 mg total) by mouth daily. 90 tablet 0   B-D UF III MINI PEN NEEDLES 31G X 5 MM MISC Inject 1 each into the skin 3 (three) times daily.     BD DISP NEEDLES 18G X 1-1/2 MISC      BD PLASTIPAK SYRINGE 21G X 1 3 ML MISC      BD PLASTIPAK SYRINGE 3 ML MISC      BD PRECISIONGLIDE NEEDLE 23G X 1-1/2 MISC      buPROPion  (WELLBUTRIN  XL) 150 MG 24 hr tablet Take 1 tablet (150 mg total) by mouth daily. 90 tablet 0   Continuous Glucose Receiver (FREESTYLE LIBRE 14 DAY READER) DEVI Use 1 Device as directed     Continuous Glucose Sensor (FREESTYLE LIBRE 3 SENSOR) MISC      cyanocobalamin  100 MCG tablet Take 100 mcg by mouth daily.     doxycycline  (VIBRAMYCIN ) 100 MG capsule Take 100 mg by mouth 2 (two) times daily.     empagliflozin (JARDIANCE) 25 MG TABS tablet Take 25 mg by mouth daily.     escitalopram  (LEXAPRO ) 20 MG tablet Take 1 tablet (20 mg total) by mouth daily. 90 tablet 1   gabapentin  (NEURONTIN ) 400 MG capsule Take 400 mg by mouth 3 (three) times daily.     HYDROcodone -acetaminophen  (NORCO/VICODIN) 5-325 MG tablet Take 1 tablet by mouth.     LANTUS  SOLOSTAR 100 UNIT/ML Solostar Pen Inject 6 Units into the skin at bedtime.  lisinopril  (ZESTRIL ) 5 MG tablet Take 5 mg by mouth daily.     metFORMIN (GLUCOPHAGE) 500 MG tablet Take 1,000 mg by mouth 2 (two) times daily with a meal.     methocarbamol  (ROBAXIN ) 500 MG tablet Take 1 tablet (500 mg total) by mouth every 6 (six) hours as needed for muscle spasms. 120 tablet 0   mupirocin ointment (BACTROBAN) 2 % Apply topically 3 (three) times daily.     pravastatin  (PRAVACHOL ) 80 MG tablet Take 80 mg by mouth daily.     pregabalin (LYRICA) 200 MG capsule Take 200 mg by mouth 2 (two)  times daily.     QUEtiapine  (SEROQUEL ) 25 MG tablet Take 1 tablet (25 mg total) by mouth at bedtime. 90 tablet 0   rOPINIRole  (REQUIP ) 0.5 MG tablet Take 0.5 mg by mouth at bedtime.     Semaglutide 14 MG TABS Take 14 mg by mouth daily.     sildenafil (VIAGRA) 100 MG tablet Take 100 mg by mouth daily as needed for erectile dysfunction.     testosterone cypionate (DEPOTESTOSTERONE CYPIONATE) 200 MG/ML injection Inject into the muscle every 14 (fourteen) days.     triamcinolone ointment (KENALOG) 0.1 % Apply topically 2 (two) times daily.     No current facility-administered medications for this visit.    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)    Depression    Diabetes mellitus without complication (HCC)    Diabetic infection of left foot (HCC)    Dysarthria    Gout    Hemiparesis affecting left side as late effect of cerebrovascular accident (CVA) (HCC)    HLD (hyperlipidemia)    Hyponatremia    Lactic acidosis    Left hallux osteomyelitis (HCC)    Osteomyelitis of ankle or foot, acute, left (HCC)    Stage 3a chronic kidney disease (HCC)    Stroke (HCC)    Type II diabetes mellitus with renal manifestations (HCC)     Past Medical History, Surgical history, Social history, and Family history were reviewed and updated as appropriate.   Please see review of systems for further details on the patient's review from today.   Objective:   Physical Exam:  There were no vitals taken for this visit.  Physical Exam  Lab Review:     Component Value Date/Time   NA 134 (L) 05/31/2023 1229   K 4.1 05/31/2023 1229   CL 102 05/31/2023 1229   CO2 24 05/31/2023 1229   GLUCOSE 193 (H)  05/31/2023 1229   BUN 26 (H) 05/31/2023 1229   CREATININE 1.24 05/31/2023 1229   CALCIUM  9.3 05/31/2023 1229   PROT 8.2 (H) 01/14/2023 0914   ALBUMIN 4.2 01/14/2023 0914   AST 24 01/14/2023 0914   ALT 19 01/14/2023 0914   ALKPHOS 91 01/14/2023 0914   BILITOT 1.1 01/14/2023 0914   GFRNONAA >60 05/31/2023 1229       Component Value Date/Time   WBC 9.2 05/31/2023 1229   RBC 5.43 05/31/2023 1229   HGB 16.7 05/31/2023 1229   HCT 49.8 05/31/2023 1229   PLT 209 05/31/2023 1229   MCV 91.7 05/31/2023 1229   MCH 30.8 05/31/2023  1229   MCHC 33.5 05/31/2023 1229   RDW 15.5 05/31/2023 1229   LYMPHSABS 1.8 01/14/2023 0914   MONOABS 0.6 01/14/2023 0914   EOSABS 0.2 01/14/2023 0914   BASOSABS 0.1 01/14/2023 0914    No results found for: POCLITH, LITHIUM   No results found for: PHENYTOIN, PHENOBARB, VALPROATE, CBMZ   .res Assessment: Plan:   Recommendations:   Continue Spravato treatment. Pt did not have any questions or concerns today.  Pt was very talkative after first treatment. Tolerated well. Rates depression today 5/10.    Will increase to 84 mg next visit.    [name] A. 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  Future Appointments  Date Time Provider Department Center  05/23/2024 11:00 AM Tobie Franky [facility], [place] [facility]  05/23/2024  1:00 PM CP-NURSE [facility] None  05/23/2024  1:00 PM Teresa [name] LABOR, NP [facility] None  06/18/2024 11:30 AM Saucier, Dorn Ruth, NP [facility] None    No orders of the defined types were placed in this encounter.   -------------------------------

## 2024-05-21 NOTE — Progress Notes (Signed)
 NURSES NOTE:         Pt arrived for his 1st Spravato Treatment for treatment resistant depression, the starting dose is 56 mg (2 of the 28 mg nasal sprays) as tolerated and if no side effects or complaints the dose will increase to 84 mg at next treatment. Stepehn was a referral and is a patient of Dorn Der, NP with [facility]. They will continue to follow his psychiatric care. Redell Pizza, NP will be following his care throughout treatments and follow ups. Explained to pt how the treatments would be scheduled and answered any questions he had today. Confirmed his ride who is named Marval, informed her she did not have to stay at the clinic but to let the front office know when she returns. Also offered her to sit with pt who did not want that, or she can sit back in the Spravato waiting area. Explained how the inhaler worked, gave him a Psychologist, educational to use and practice with. Pt's Spravato is a medical authorization through buy and bill.  Spravato medication is stored at treatment center per REMS/FDA guidelines. The medication is required to be locked behind two doors per REMS/FDA protocol. Medication is also disposed of properly after each use per regulations. All documentation for REMS is completed and submitted per FDA/REMS requirements.          Began taking patient's vital signs at 9:35 AM 114/69, pulse 76, SpO2 96%. Instructed patient to blow his nose if needed then recline back to a 45 degree angle. Gave patient first dose 28 mg nasal spray, administered in each nostril as directed and observed by nurse, waited 5 more minutes for the second dose. After both doses given pt did not complain of any nausea/vomiting. Assessed his 40 minute vitals, 10:15 AM, 133/80, pulse 65, SpO2 97%. Pt reports doing fine, no complaints voiced  Explained he would be monitored for a total time of 120 minutes. Discharge vitals were taken at 11:31 AM 99/65, P 65, SpO2 99%. Redell Pizza, NP came to  visit with patient once his thoughts were clearer to discuss how treatment went. Recommend he go home and sleep or just relax on the couch. No driving, no intense activities. Verbalized understanding. Pt's ride, Debbie walked pt out, pt is very unsteady on his feet not only from Spravato but he also has partial left foot amputee. Pt. will be receiving 2 treatments per week for 4 weeks as recommended. Nurse was with pt a total of 60 minutes for clinical assessment. Pt is scheduled this Thursday, July24th. Instructed to call with any issues.     LOT [id number] EXP FEB 2027

## 2024-05-23 ENCOUNTER — Ambulatory Visit

## 2024-05-23 ENCOUNTER — Ambulatory Visit (INDEPENDENT_AMBULATORY_CARE_PROVIDER_SITE_OTHER): Admitting: Podiatry

## 2024-05-23 ENCOUNTER — Ambulatory Visit (INDEPENDENT_AMBULATORY_CARE_PROVIDER_SITE_OTHER): Admitting: Behavioral Health

## 2024-05-23 VITALS — BP 98/57 | HR 75

## 2024-05-23 DIAGNOSIS — L97512 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ed: Secondary | ICD-10-CM

## 2024-05-23 DIAGNOSIS — F329 Major depressive disorder, single episode, unspecified: Secondary | ICD-10-CM

## 2024-05-23 DIAGNOSIS — E119 Type 2 diabetes mellitus without complications: Secondary | ICD-10-CM

## 2024-05-23 NOTE — Progress Notes (Signed)
 Application of Amnio-Maxx Graft: -arterial studies and/or Vascular intervention: Patient has palpable pulses therefore ABIs PVRs are deferred -Patient has severe morbidity: Type 2 diabetes with A1c of 6.2 -Patient was also nutritionally optimized and encouraged protein rich diet -Smoking cessation was discussed in extensive detail.  However patient is not a smoker  -Wound start date: January 30, 2024 -Interventions attempted: Betadine wet to dry/local wound care, offloading, immobilization  -Decision was made to use Amnionic graft since partial / full thickness skin grafts such as Theraskin can lead to scarring and wound recurrence. The clinic did not use any synthetic grafts due to higher chance of foreign body reactions /rejection. Cryo-preserved amniotic graft was not used since storage infrastructure is not available at this outpatient clinic and the patient is unwilling to go to a wound care center. Grafts requiring upfront purchase were not considered due to financial limitations of this outpatient clinic and thus only grafts available on consignment were considered. Amionitic was chosen based on various characteristics including but not limited to scaffolding properties, heavy chain hyaluronic acid, cytokines, and grown factors to expedite wound healing.  Graft application: # 8 -The wound was cleansed with saline. Selective debridement techniques to remove devitalized tissue was performed through subcutaneous tissue with 15 blade until viable bleeding tissue was encountered. -Topical application of Bovine Helicoll graft was placed over the wound and dressed with adaptic non-adherent, gauze, kling and ACE bandage. -Product Code:Amn-22 -Expiration: 03/13/2029 -Lot# [id number]    Procedure: Excisional Debridement of Wound Tool: Sharp chisel blade/tissue nipper Rationale: Removal of non-viable soft tissue from the wound to promote healing.  Anesthesia: none Pre-Debridement Wound  Measurements: 0.3 cm x by 0.3 cm x 0.3 Post-Debridement Wound Measurements: 0.3 cm x by 0.3 cm x 0.3 Type of Debridement: Sharp Excisional Tissue Removed: Non-viable soft tissue Blood loss: Minimal (<50cc) Depth of Debridement: subcutaneous tissue. Technique: Sharp excisional debridement to bleeding, viable wound base.  Wound Progress: The wound continues to decrease slowly due to it being a weightbearing surface. Dressing: Dry, sterile, compression dressing. Disposition: Patient tolerated procedure well. Patient to return in 1 week for follow-up.   -The entire graft was utilized and non discarded. Graft was used in wound described above. I folded the graft in order to maximize amount of product used for the wound and maximize benefit from scaffold properties of the amniotic graft.

## 2024-05-23 NOTE — Progress Notes (Signed)
 [name] [id number] 03-21-1956 68 y.o.  Subjective:   Patient ID:  [name] is a 69 y.o. (DOB 1956-10-13) male.  Chief Complaint: No chief complaint on file.   HPI [name] presents to the office today for follow-up of  treatment resistant depression (TRD).   Spravato treatment    Patient was administered Spravato 54 mg intranasally today. Patient was observed by provider throughout Spravato treatment. The patient experienced the typical dissociation which gradually resolved over the 2-hour period of observation. There were no complications. Specifically, the patient did not have any untoward side effects - feeling disconnected from themself, their thoughts, feelings and things around them, dizziness, nausea, feeling sleepy, decreased feeling of sensitivity (numbness) spinning sensation, feeling anxious, lack of energy, increased blood pressure, feeling happy or very excited, or headache. Blood pressures remained within normal ranges at the 40-minute and 2-hour follow-up intervals. By the time the 2-hour observation period was met the patient was alert and oriented and able to exit without assistance. Patient willing to continue Spravato administration for the treatment of resistant depression.   Refer to nursing notes for further.  Pt says that he is not sure he can tell a difference so far. He has no questions or concerns.     PHQ2-9    Flowsheet Row Office Visit from 04/18/2024 in [facility]  PHQ-2 Total Score 6  PHQ-9 Total Score 18   Flowsheet Row Admission (Discharged) from 06/05/2023 in [facility] ED from 05/15/2023 in [facility] Emergency Department at [facility] ED from 01/14/2023 in [facility] Emergency Department at [facility]  C-SSRS RISK CATEGORY No Risk No Risk No Risk     Review of Systems:  Review of Systems  Constitutional: Negative.   Allergic/Immunologic: Negative.    Neurological: Negative.   Psychiatric/Behavioral:  Positive for dysphoric mood.     Medications: I have reviewed the patient's current medications.  Current Outpatient Medications  Medication Sig Dispense Refill   allopurinol  (ZYLOPRIM ) 300 MG tablet Take 300 mg by mouth daily.     ammonium lactate (LAC-HYDRIN) 12 % lotion Apply topically.     atorvastatin  (LIPITOR) 80 MG tablet Take 1 tablet (80 mg total) by mouth daily. 90 tablet 0   B-D UF III MINI PEN NEEDLES 31G X 5 MM MISC Inject 1 each into the skin 3 (three) times daily.     BD DISP NEEDLES 18G X 1-1/2 MISC      BD PLASTIPAK SYRINGE 21G X 1 3 ML MISC      BD PLASTIPAK SYRINGE 3 ML MISC      BD PRECISIONGLIDE NEEDLE 23G X 1-1/2 MISC      buPROPion  (WELLBUTRIN  XL) 150 MG 24 hr tablet Take 1 tablet (150 mg total) by mouth daily. 90 tablet 0   Continuous Glucose Receiver (FREESTYLE LIBRE 14 DAY READER) DEVI Use 1 Device as directed     Continuous Glucose Sensor (FREESTYLE LIBRE 3 SENSOR) MISC      cyanocobalamin  100 MCG tablet Take 100 mcg by mouth daily.     doxycycline  (VIBRAMYCIN ) 100 MG capsule Take 100 mg by mouth 2 (two) times daily.     empagliflozin (JARDIANCE) 25 MG TABS tablet Take 25 mg by mouth daily.     escitalopram  (LEXAPRO ) 20 MG tablet Take 1 tablet (20 mg total) by mouth daily. 90 tablet 1   gabapentin  (NEURONTIN ) 400 MG capsule Take 400 mg by mouth 3 (three) times daily.  HYDROcodone -acetaminophen  (NORCO/VICODIN) 5-325 MG tablet Take 1 tablet by mouth.     LANTUS  SOLOSTAR 100 UNIT/ML Solostar Pen Inject 6 Units into the skin at bedtime.     lisinopril  (ZESTRIL ) 5 MG tablet Take 5 mg by mouth daily.     metFORMIN (GLUCOPHAGE) 500 MG tablet Take 1,000 mg by mouth 2 (two) times daily with a meal.     methocarbamol  (ROBAXIN ) 500 MG tablet Take 1 tablet (500 mg total) by mouth every 6 (six) hours as needed for muscle spasms. 120 tablet 0   mupirocin ointment (BACTROBAN) 2 % Apply topically 3 (three) times daily.      pravastatin  (PRAVACHOL ) 80 MG tablet Take 80 mg by mouth daily.     pregabalin (LYRICA) 200 MG capsule Take 200 mg by mouth 2 (two) times daily.     QUEtiapine  (SEROQUEL ) 25 MG tablet Take 1 tablet (25 mg total) by mouth at bedtime. 90 tablet 0   rOPINIRole  (REQUIP ) 0.5 MG tablet Take 0.5 mg by mouth at bedtime.     Semaglutide 14 MG TABS Take 14 mg by mouth daily.     sildenafil (VIAGRA) 100 MG tablet Take 100 mg by mouth daily as needed for erectile dysfunction.     testosterone cypionate (DEPOTESTOSTERONE CYPIONATE) 200 MG/ML injection Inject into the muscle every 14 (fourteen) days.     triamcinolone ointment (KENALOG) 0.1 % Apply topically 2 (two) times daily.     No current facility-administered medications for this visit.    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)    Depression    Diabetes mellitus without complication (HCC)    Diabetic infection of left foot (HCC)    Dysarthria    Gout    Hemiparesis affecting left side as late effect of cerebrovascular accident (CVA) (HCC)    HLD (hyperlipidemia)    Hyponatremia    Lactic acidosis    Left hallux osteomyelitis (HCC)    Osteomyelitis of ankle or foot, acute, left (HCC)    Stage 3a chronic kidney disease (HCC)    Stroke (HCC)    Type II diabetes mellitus with renal manifestations (HCC)     Past Medical History, Surgical history, Social history, and Family history were reviewed and updated as appropriate.   Please see review of systems for further details on the patient's review from today.   Objective:   Physical Exam:  There were no vitals taken for this visit.  Physical Exam Constitutional:      General: He is not  in acute distress.    Appearance: Normal appearance.  Neurological:     Mental Status: He is alert and oriented to person, place, and time.     Gait: Gait normal.  Psychiatric:        Attention and Perception: Attention and perception normal. He does not perceive auditory or visual hallucinations.        Mood and Affect: Mood and affect normal. Mood is not anxious or depressed. Affect is not labile.        Speech: Speech normal.        Behavior: Behavior normal. Behavior is cooperative.  Thought Content: Thought content normal.        Cognition and Memory: Cognition and memory normal.        Judgment: Judgment normal.     Lab Review:     Component Value Date/Time   NA 134 (L) 05/31/2023 1229   K 4.1 05/31/2023 1229   CL 102 05/31/2023 1229   CO2 24 05/31/2023 1229   GLUCOSE 193 (H) 05/31/2023 1229   BUN 26 (H) 05/31/2023 1229   CREATININE 1.24 05/31/2023 1229   CALCIUM  9.3 05/31/2023 1229   PROT 8.2 (H) 01/14/2023 0914   ALBUMIN 4.2 01/14/2023 0914   AST 24 01/14/2023 0914   ALT 19 01/14/2023 0914   ALKPHOS 91 01/14/2023 0914   BILITOT 1.1 01/14/2023 0914   GFRNONAA >60 05/31/2023 1229       Component Value Date/Time   WBC 9.2 05/31/2023 1229   RBC 5.43 05/31/2023 1229   HGB 16.7 05/31/2023 1229   HCT 49.8 05/31/2023 1229   PLT 209 05/31/2023 1229   MCV 91.7 05/31/2023 1229   MCH 30.8 05/31/2023 1229   MCHC 33.5 05/31/2023 1229   RDW 15.5 05/31/2023 1229   LYMPHSABS 1.8 01/14/2023 0914   MONOABS 0.6 01/14/2023 0914   EOSABS 0.2 01/14/2023 0914   BASOSABS 0.1 01/14/2023 0914    No results found for: POCLITH, LITHIUM   No results found for: PHENYTOIN, PHENOBARB, VALPROATE, CBMZ   .res Assessment: Plan:    Recommendations:   Continue Spravato treatment. Pt did not have any questions or concerns today.  Pt was very talkative after first treatment. Tolerated well. Rates depression today 4/10.    Explained to pt that treatment takes time to  work and it is to early to notice  improvement.  He acknowledges understanding.    [name] A. 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  Future Appointments  Date Time Provider Department Center  05/28/2024  9:30 AM CP-NURSE [facility] None  05/28/2024  9:30 AM Teresa [name] LABOR, NP [facility] None  05/30/2024 11:00 AM Tobie Franky SQUIBB, D[facility]  06/18/2024 11:30 AM Saucier, Dorn Ruth, NP [facility] None    No orders of the defined types were placed in this encounter.   -------------------------------

## 2024-05-23 NOTE — Progress Notes (Signed)
 NURSES NOTE:         Pt arrived for his 2nd Spravato Treatment for treatment resistant depression, the starting dose was 56 mg (2 of the 28 mg nasal sprays) as tolerated and he reports no side effects or complaints, today he will be receiving 84 mg. [name] was a referral and is a patient of [name] Der, [name] with [employer] [facility]. They will continue to follow his psychiatric care. [name] Pizza, [name] will be following his care throughout treatments and follow ups. Explained to pt how the treatments would be scheduled and answered any questions he had today. Confirmed his ride who is named [name], informed her she did not have to stay at the clinic but to let the front office know when she returns. Also offered her to sit with pt who did not want that, or she can sit back in the Spravato waiting area. Explained how the inhaler worked, gave him a Psychologist, educational to use and practice with. Pt's Spravato is a medical authorization through buy and bill.  Spravato medication is stored at treatment center per REMS/FDA guidelines. The medication is required to be locked behind two doors per REMS/FDA protocol. Medication is also disposed of properly after each use per regulations. All documentation for REMS is completed and submitted per FDA/REMS requirements.          Began taking patient's vital signs at 1:05 PM 99/61, pulse 88, SpO2 98%. Instructed patient to blow his nose if needed then recline back to a 45 degree angle. Gave patient first dose 28 mg nasal spray, administered in each nostril as directed and observed by nurse, waited 5 more minutes for the second and third doses. After all doses given pt did not complain of any nausea/vomiting. Assessed his 40 minute vitals, 1:45 PM, 101/65, pulse 83, SpO2 97%. Pt reports doing fine, no complaints voiced  Explained he would be monitored for a total time of 120 minutes. Discharge vitals were taken at 2:58 PM 98/57, P 75, SpO2 98%. [name] Pizza, [name] came to  visit with patient once his thoughts were clearer to discuss how treatment went. Recommend he go home and sleep or just relax on the couch. No driving, no intense activities. Verbalized understanding. Pt's ride, [name] walked pt out, pt is using his scooter today which helps tremendously since his gait is so unsteady. Pt. will be receiving 2 treatments per week for 4 weeks as recommended. Nurse was with pt a total of 60 minutes for clinical assessment. Pt is scheduled next Tuesday and Thursday. Instructed to call with any issues.     LOT [id number] EXP JAN 2028

## 2024-05-28 ENCOUNTER — Ambulatory Visit (INDEPENDENT_AMBULATORY_CARE_PROVIDER_SITE_OTHER): Admitting: Behavioral Health

## 2024-05-28 ENCOUNTER — Ambulatory Visit

## 2024-05-28 VITALS — BP 120/68 | HR 73

## 2024-05-28 DIAGNOSIS — F329 Major depressive disorder, single episode, unspecified: Secondary | ICD-10-CM

## 2024-05-28 NOTE — Progress Notes (Signed)
 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 [name] was a referral and is a patient of [name] Der, NP with [employer]. They will continue to follow his psychiatric care. [name] Pizza, NP will be following his care throughout treatments and follow ups. Explained to pt how the treatments would be scheduled and answered any questions he had today. Confirmed his ride who is named [name], informed her she did not have to stay at the clinic but to let the front office know when she returns. Also offered her to sit with pt who did not want that, or she can sit back in the Spravato waiting area. Explained how the inhaler worked, gave him a Psychologist, educational to use and practice with. Pt's Spravato is a medical authorization through buy and bill.  Spravato medication is stored at treatment center per REMS/FDA guidelines. The medication is required to be locked behind two doors per REMS/FDA protocol. Medication is also disposed of properly after each use per regulations. All documentation for REMS is completed and submitted per FDA/REMS requirements.          Began taking patient's vital signs at 9:25 AM 107/76, pulse 76, SpO2 99%. Instructed patient to blow his nose if needed then recline back to a 45 degree angle. Gave patient first dose 28 mg nasal spray, administered in each nostril as directed and observed by nurse, waited 5 more minutes for the second and third doses. After all doses given pt did not complain of any nausea/vomiting. Assessed his 40 minute vitals, 10:05 AM, 127/79, pulse 75, SpO2 97%. Pt reports doing fine, no complaints voiced  Explained he would be monitored for a total time of 120 minutes. Discharge vitals were taken at 11:37 PM 120/68, P 73, SpO2 97%. [name] Pizza, NP came to  visit with patient once his thoughts were clearer to discuss how treatment went. Recommend he go home and sleep or just relax on the couch. No driving, no intense activities. Verbalized understanding. Pt's ride, [name] walked pt out, pt is using his scooter today which helps tremendously since his gait is so unsteady. Pt. will be receiving 2 treatments per week for 4 weeks as recommended. Nurse was with pt a total of 60 minutes for clinical assessment. Pt is scheduled next Thursday. Instructed to call with any issues.     LOT [id number] EXP JAN [id number]

## 2024-05-28 NOTE — Progress Notes (Signed)
 [name] [id number] 1956-08-23 68 y.o.  Subjective:   Patient ID:  [name] is a 68 y.o. (DOB 19-Mar-1956) male.  Chief Complaint: No chief complaint on file.   HPI  Khaleb Broz presents to the office today for follow-up of  treatment resistant depression (TRD).   Spravato treatment    Patient was administered Spravato 54 mg intranasally today. Patient was observed by provider throughout Spravato treatment. The patient experienced the typical dissociation which gradually resolved over the 2-hour period of observation. There were no complications. Specifically, the patient did not have any untoward side effects - feeling disconnected from themself, their thoughts, feelings and things around them, dizziness, nausea, feeling sleepy, decreased feeling of sensitivity (numbness) spinning sensation, feeling anxious, lack of energy, increased blood pressure, feeling happy or very excited, or headache. Blood pressures remained within normal ranges at the 40-minute and 2-hour follow-up intervals. By the time the 2-hour observation period was met the patient was alert and oriented and able to exit without assistance. Patient willing to continue Spravato administration for the treatment of resistant depression.   Refer to nursing notes for further.   Pt says that he is not sure he can tell a difference so far. He has no questions or concerns.      PHQ2-9    Flowsheet Row Office Visit from 04/18/2024 in [facility]  PHQ-2 Total Score 6  PHQ-9 Total Score 18   Flowsheet Row Admission (Discharged) from 06/05/2023 in [facility] from 05/15/2023 in [facility] at [facility] ED from 01/14/2023 in [facility] Emergency Department at [facility]  C-SSRS RISK CATEGORY No Risk No Risk No Risk     Review of Systems:  Review of Systems  Constitutional: Negative.   Allergic/Immunologic: Negative.    Neurological: Negative.     Medications: I have reviewed the patient's current medications.  Current Outpatient Medications  Medication Sig Dispense Refill   allopurinol  (ZYLOPRIM ) 300 MG tablet Take 300 mg by mouth daily.     ammonium lactate (LAC-HYDRIN) 12 % lotion Apply topically.     atorvastatin  (LIPITOR) 80 MG tablet Take 1 tablet (80 mg total) by mouth daily. 90 tablet 0   B-D UF III MINI PEN NEEDLES 31G X 5 MM MISC Inject 1 each into the skin 3 (three) times daily.     BD DISP NEEDLES 18G X 1-1/2 MISC      BD PLASTIPAK SYRINGE 21G X 1 3 ML MISC      BD PLASTIPAK SYRINGE 3 ML MISC      BD PRECISIONGLIDE NEEDLE 23G X 1-1/2 MISC      buPROPion  (WELLBUTRIN  XL) 150 MG 24 hr tablet Take 1 tablet (150 mg total) by mouth daily. 90 tablet 0   Continuous Glucose Receiver (FREESTYLE LIBRE 14 DAY READER) DEVI Use 1 Device as directed     Continuous Glucose Sensor (FREESTYLE LIBRE 3 SENSOR) MISC      cyanocobalamin  100 MCG tablet Take 100 mcg by mouth daily.     doxycycline  (VIBRAMYCIN ) 100 MG capsule Take 100 mg by mouth 2 (two) times daily.     empagliflozin (JARDIANCE) 25 MG TABS tablet Take 25 mg by mouth daily.     escitalopram  (LEXAPRO ) 20 MG tablet Take 1 tablet (20 mg total) by mouth daily. 90 tablet 1   gabapentin  (NEURONTIN ) 400 MG capsule Take 400 mg by mouth 3 (three) times daily.     HYDROcodone -acetaminophen  (NORCO/VICODIN) 5-325  MG tablet Take 1 tablet by mouth.     LANTUS  SOLOSTAR 100 UNIT/ML Solostar Pen Inject 6 Units into the skin at bedtime.     lisinopril  (ZESTRIL ) 5 MG tablet Take 5 mg by mouth daily.     metFORMIN (GLUCOPHAGE) 500 MG tablet Take 1,000 mg by mouth 2 (two) times daily with a meal.     methocarbamol  (ROBAXIN ) 500 MG tablet Take 1 tablet (500 mg total) by mouth every 6 (six) hours as needed for muscle spasms. 120 tablet 0   mupirocin ointment (BACTROBAN) 2 % Apply topically 3 (three) times daily.     pravastatin  (PRAVACHOL ) 80 MG tablet Take 80 mg by  mouth daily.     pregabalin (LYRICA) 200 MG capsule Take 200 mg by mouth 2 (two) times daily.     QUEtiapine  (SEROQUEL ) 25 MG tablet Take 1 tablet (25 mg total) by mouth at bedtime. 90 tablet 0   rOPINIRole  (REQUIP ) 0.5 MG tablet Take 0.5 mg by mouth at bedtime.     Semaglutide 14 MG TABS Take 14 mg by mouth daily.     sildenafil (VIAGRA) 100 MG tablet Take 100 mg by mouth daily as needed for erectile dysfunction.     testosterone cypionate (DEPOTESTOSTERONE CYPIONATE) 200 MG/ML injection Inject into the muscle every 14 (fourteen) days.     triamcinolone ointment (KENALOG) 0.1 % Apply topically 2 (two) times daily.     No current facility-administered medications for this visit.    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)    Depression    Diabetes mellitus without complication (HCC)    Diabetic infection of left foot (HCC)    Dysarthria    Gout    Hemiparesis affecting left side as late effect of cerebrovascular accident (CVA) (HCC)    HLD (hyperlipidemia)    Hyponatremia    Lactic acidosis    Left hallux osteomyelitis (HCC)    Osteomyelitis of ankle or foot, acute, left (HCC)    Stage 3a chronic kidney disease (HCC)    Stroke (HCC)    Type II diabetes mellitus with renal manifestations (HCC)     Past Medical History, Surgical history, Social history, and Family history were reviewed and updated as appropriate.   Please see review of systems for further details on the patient's review from today.   Objective:   Physical Exam:  There were no vitals taken for this visit.  Physical Exam  Lab Review:     Component Value Date/Time   NA 134 (L) 05/31/2023 1229   K 4.1 05/31/2023  1229   CL 102 05/31/2023 1229   CO2 24 05/31/2023 1229   GLUCOSE 193 (H) 05/31/2023 1229   BUN 26 (H) 05/31/2023 1229   CREATININE 1.24 05/31/2023 1229   CALCIUM  9.3 05/31/2023 1229   PROT 8.2 (H) 01/14/2023 0914   ALBUMIN 4.2 01/14/2023 0914   AST 24 01/14/2023 0914   ALT 19 01/14/2023 0914   ALKPHOS 91 01/14/2023 0914   BILITOT 1.1 01/14/2023 0914   GFRNONAA >60 05/31/2023 1229       Component Value Date/Time   WBC 9.2 05/31/2023 1229   RBC 5.43 05/31/2023 1229  HGB 16.7 05/31/2023 1229   HCT 49.8 05/31/2023 1229   PLT 209 05/31/2023 1229   MCV 91.7 05/31/2023 1229   MCH 30.8 05/31/2023 1229   MCHC 33.5 05/31/2023 1229   RDW 15.5 05/31/2023 1229   LYMPHSABS 1.8 01/14/2023 0914   MONOABS 0.6 01/14/2023 0914   EOSABS 0.2 01/14/2023 0914   BASOSABS 0.1 01/14/2023 0914    No results found for: POCLITH, LITHIUM   No results found for: PHENYTOIN, PHENOBARB, VALPROATE, CBMZ   .res Assessment: Plan:   Recommendations:   Continue Spravato treatment. Pt did not have any questions or concerns today.  Pt was very talkative after first treatment. Tolerated well. Rates depression today 3/10.    Explained to pt that treatment takes time to work and it is to early to notice  improvement.  He acknowledges understanding.    Redell A. Sarea Fyfe, NP         Diagnoses and all orders for this visit:  Depression resistant to treatment     Please see After Visit Summary for patient specific instructions.  Future Appointments  Date Time Provider Department Center  05/30/2024 11:00 AM Tobie Franky [facility] [place] [facility]  06/18/2024 11:30 AM Saucier, Dorn Ruth, NP [facility] None    No orders of the defined types were placed in this encounter.   -------------------------------

## 2024-05-30 ENCOUNTER — Ambulatory Visit (INDEPENDENT_AMBULATORY_CARE_PROVIDER_SITE_OTHER): Admitting: Behavioral Health

## 2024-05-30 ENCOUNTER — Ambulatory Visit (INDEPENDENT_AMBULATORY_CARE_PROVIDER_SITE_OTHER): Admitting: Podiatry

## 2024-05-30 ENCOUNTER — Encounter: Admitting: Behavioral Health

## 2024-05-30 ENCOUNTER — Encounter

## 2024-05-30 ENCOUNTER — Ambulatory Visit

## 2024-05-30 VITALS — BP 102/61 | HR 72

## 2024-05-30 DIAGNOSIS — L97512 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ed: Secondary | ICD-10-CM | POA: Diagnosis not present

## 2024-05-30 DIAGNOSIS — F329 Major depressive disorder, single episode, unspecified: Secondary | ICD-10-CM

## 2024-05-30 MED ORDER — SANTYL 250 UNIT/GM EX OINT: 1.0000 | TOPICAL_OINTMENT | Freq: Every day | CUTANEOUS | 0 refills | Status: DC

## 2024-05-30 MED ORDER — SANTYL 250 UNIT/GM EX OINT
1.0000 | TOPICAL_OINTMENT | Freq: Every day | CUTANEOUS | 0 refills | Status: DC
Start: 2024-05-30 — End: 2024-07-03

## 2024-05-30 NOTE — Progress Notes (Signed)
 Application of Amnio-Maxx Graft: -arterial studies and/or Vascular intervention: Patient has palpable pulses therefore ABIs PVRs are deferred -Patient has severe morbidity: Type 2 diabetes with A1c of 6.2 -Patient was also nutritionally optimized and encouraged protein rich diet -Smoking cessation was discussed in extensive detail.  However patient is not a smoker  -Wound start date: January 30, 2024 -Interventions attempted: Betadine wet to dry/local wound care, offloading, immobilization  -Decision was made to use Amnionic graft since partial / full thickness skin grafts such as Theraskin can lead to scarring and wound recurrence. The clinic did not use any synthetic grafts due to higher chance of foreign body reactions /rejection. Cryo-preserved amniotic graft was not used since storage infrastructure is not available at this outpatient clinic and the patient is unwilling to go to a wound care center. Grafts requiring upfront purchase were not considered due to financial limitations of this outpatient clinic and thus only grafts available on consignment were considered. Amionitic was chosen based on various characteristics including but not limited to scaffolding properties, heavy chain hyaluronic acid, cytokines, and grown factors to expedite wound healing.  Graft application: # 9 -The wound was cleansed with saline. Selective debridement techniques to remove devitalized tissue was performed through subcutaneous tissue with 15 blade until viable bleeding tissue was encountered. -Topical application of Bovine Helicoll graft was placed over the wound and dressed with adaptic non-adherent, gauze, kling and ACE bandage. -Product Code:Amn-22 -Expiration: 03/15/2029 -Lot# [id number]    Procedure: Excisional Debridement of Wound Tool: Sharp chisel blade/tissue nipper Rationale: Removal of non-viable soft tissue from the wound to promote healing.  Anesthesia: none Pre-Debridement Wound  Measurements: 0.3 cm x by 0.3 cm x 0.3 Post-Debridement Wound Measurements: 0.3 cm x by 0.3 cm x 0.3 Type of Debridement: Sharp Excisional Tissue Removed: Non-viable soft tissue Blood loss: Minimal (<50cc) Depth of Debridement: subcutaneous tissue. Technique: Sharp excisional debridement to bleeding, viable wound base.  Wound Progress: The wound had reached a point of stagnation.  This will be the last application.  Will try different medication Dressing: Dry, sterile, compression dressing. Disposition: Patient tolerated procedure well. Patient to return in 1 week for follow-up.   -The entire graft was utilized and non discarded. Graft was used in wound described above. I folded the graft in order to maximize amount of product used for the wound and maximize benefit from scaffold properties of the amniotic graft.

## 2024-05-30 NOTE — Progress Notes (Signed)
 NURSES NOTE:         Pt arrived for his 4th Spravato Treatment for treatment resistant depression, the starting dose was 56 mg (2 of the 28 mg nasal sprays) as tolerated and he reports no side effects or complaints, today he will be receiving 84 mg. Rollo was a referral and is a patient of Dorn Der, NP with [employer]. They will continue to follow his psychiatric care. Redell Pizza, NP will be following his care throughout treatments and follow ups. Explained to pt how the treatments would be scheduled and answered any questions he had today. Confirmed his ride who is named Marval, informed her she did not have to stay at the clinic but to let the front office know when she returns. Also offered her to sit with pt who did not want that, or she can sit back in the Spravato waiting area. Explained how the inhaler worked, gave him a Psychologist, educational to use and practice with. Pt's Spravato is a medical authorization through buy and bill.  Spravato medication is stored at treatment center per REMS/FDA guidelines. The medication is required to be locked behind two doors per REMS/FDA protocol. Medication is also disposed of properly after each use per regulations. All documentation for REMS is completed and submitted per FDA/REMS requirements.          Began taking patient's vital signs at 1:00 PM 106/63, pulse 69, SpO2 95%. Instructed patient to blow his nose if needed then recline back to a 45 degree angle. Gave patient first dose 28 mg nasal spray, administered in each nostril as directed and observed by nurse, waited 5 more minutes for the second and third doses. After all doses given pt did not complain of any nausea/vomiting. Assessed his 40 minute vitals, 1:42 PM, 112/65, pulse 74, SpO2 99%. Pt reports doing fine, no complaints voiced  Explained he would be monitored for a total time of 120 minutes. Discharge vitals were taken at 2:57 PM 102/61, P 72, SpO2 96%. Redell Pizza, NP came to  visit with patient once his thoughts were clearer to discuss how treatment went. Recommend he go home and sleep or just relax on the couch. No driving, no intense activities. Verbalized understanding. Pt's ride, Debbie walked pt out, pt is using his scooter today which helps tremendously since his gait is so unsteady. Pt. will be receiving 2 treatments per week for 4 weeks as recommended. Nurse was with pt a total of 60 minutes for clinical assessment. Pt is scheduled next Tuesday and Thursday. Instructed to call with any issues.     LOT [id number] EXP JAN 2028

## 2024-05-30 NOTE — Progress Notes (Signed)
 [name] [id number] 1956-03-02 68 y.o.  Subjective:   Patient ID:  [name] is a 68 y.o. (DOB 09/10/1956) male.  Chief Complaint: No chief complaint on file.   HPI  [name] presents to the office today for follow-up of  treatment resistant depression (TRD).   Spravato treatment    Patient was administered Spravato 54 mg intranasally today. Patient was observed by provider throughout Spravato treatment. The patient experienced the typical dissociation which gradually resolved over the 2-hour period of observation. There were no complications. Specifically, the patient did not have any untoward side effects - feeling disconnected from themself, their thoughts, feelings and things around them, dizziness, nausea, feeling sleepy, decreased feeling of sensitivity (numbness) spinning sensation, feeling anxious, lack of energy, increased blood pressure, feeling happy or very excited, or headache. Blood pressures remained within normal ranges at the 40-minute and 2-hour follow-up intervals. By the time the 2-hour observation period was met the patient was alert and oriented and able to exit without assistance. Patient willing to continue Spravato administration for the treatment of resistant depression.   Refer to nursing notes for further.   Pt says that he is not sure he can tell a difference so far. He has no questions or concerns.       PHQ2-9    Flowsheet Row Office Visit from 04/18/2024 in [facility]  PHQ-2 Total Score 6  PHQ-9 Total Score 18   Flowsheet Row Admission (Discharged) from 06/05/2023 in [facility] ED from 05/15/2023 in [facility] Emergency Department at [facility] ED from 01/14/2023 in [facility] Emergency Department at [facility]  C-SSRS RISK CATEGORY No Risk No Risk No Risk     Review of Systems:  Review of Systems  Constitutional: Negative.   Allergic/Immunologic:  Negative.   Neurological: Negative.   Psychiatric/Behavioral:  Positive for dysphoric mood.     Medications: I have reviewed the patient's current medications.  Current Outpatient Medications  Medication Sig Dispense Refill   allopurinol  (ZYLOPRIM ) 300 MG tablet Take 300 mg by mouth daily.     ammonium lactate (LAC-HYDRIN) 12 % lotion Apply topically.     atorvastatin  (LIPITOR) 80 MG tablet Take 1 tablet (80 mg total) by mouth daily. 90 tablet 0   B-D UF III MINI PEN NEEDLES 31G X 5 MM MISC Inject 1 each into the skin 3 (three) times daily.     BD DISP NEEDLES 18G X 1-1/2 MISC      BD PLASTIPAK SYRINGE 21G X 1 3 ML MISC      BD PLASTIPAK SYRINGE 3 ML MISC      BD PRECISIONGLIDE NEEDLE 23G X 1-1/2 MISC      buPROPion  (WELLBUTRIN  XL) 150 MG 24 hr tablet Take 1 tablet (150 mg total) by mouth daily. 90 tablet 0   collagenase  (SANTYL ) 250 UNIT/GM ointment Apply 1 Application topically daily. Wound measurement 0.3 cm x 0.3 cm x 0.3 cm 30 g 0   Continuous Glucose Receiver (FREESTYLE LIBRE 14 DAY READER) DEVI Use 1 Device as directed     Continuous Glucose Sensor (FREESTYLE LIBRE 3 SENSOR) MISC      cyanocobalamin  100 MCG tablet Take 100 mcg by mouth daily.     doxycycline  (VIBRAMYCIN ) 100 MG capsule Take 100 mg by mouth 2 (two) times daily.     empagliflozin (JARDIANCE) 25 MG TABS tablet Take 25 mg by mouth daily.     escitalopram  (LEXAPRO ) 20 MG tablet Take  1 tablet (20 mg total) by mouth daily. 90 tablet 1   gabapentin  (NEURONTIN ) 400 MG capsule Take 400 mg by mouth 3 (three) times daily.     HYDROcodone -acetaminophen  (NORCO/VICODIN) 5-325 MG tablet Take 1 tablet by mouth.     LANTUS  SOLOSTAR 100 UNIT/ML Solostar Pen Inject 6 Units into the skin at bedtime.     lisinopril  (ZESTRIL ) 5 MG tablet Take 5 mg by mouth daily.     metFORMIN (GLUCOPHAGE) 500 MG tablet Take 1,000 mg by mouth 2 (two) times daily with a meal.     methocarbamol  (ROBAXIN ) 500 MG tablet Take 1 tablet (500 mg total) by  mouth every 6 (six) hours as needed for muscle spasms. 120 tablet 0   mupirocin ointment (BACTROBAN) 2 % Apply topically 3 (three) times daily.     pravastatin  (PRAVACHOL ) 80 MG tablet Take 80 mg by mouth daily.     pregabalin (LYRICA) 200 MG capsule Take 200 mg by mouth 2 (two) times daily.     QUEtiapine  (SEROQUEL ) 25 MG tablet Take 1 tablet (25 mg total) by mouth at bedtime. 90 tablet 0   rOPINIRole  (REQUIP ) 0.5 MG tablet Take 0.5 mg by mouth at bedtime.     Semaglutide 14 MG TABS Take 14 mg by mouth daily.     sildenafil (VIAGRA) 100 MG tablet Take 100 mg by mouth daily as needed for erectile dysfunction.     testosterone cypionate (DEPOTESTOSTERONE CYPIONATE) 200 MG/ML injection Inject into the muscle every 14 (fourteen) days.     triamcinolone ointment (KENALOG) 0.1 % Apply topically 2 (two) times daily.     No current facility-administered medications for this visit.    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)    Depression    Diabetes mellitus without complication (HCC)    Diabetic infection of left foot (HCC)    Dysarthria    Gout    Hemiparesis affecting left side as late effect of cerebrovascular accident (CVA) (HCC)    HLD (hyperlipidemia)    Hyponatremia    Lactic acidosis    Left hallux osteomyelitis (HCC)    Osteomyelitis of ankle or foot, acute, left (HCC)    Stage 3a chronic kidney disease (HCC)    Stroke (HCC)    Type II diabetes mellitus with renal manifestations (HCC)     Past Medical History, Surgical history, Social history, and Family history were reviewed and updated as appropriate.   Please see review of systems for further details on the patient's review from  today.   Objective:   Physical Exam:  There were no vitals taken for this visit.  Physical Exam Constitutional:      General: He is not in acute distress.    Appearance: Normal appearance.  Neurological:     Mental Status: He is alert and oriented to person, place, and time.     Gait: Gait normal.  Psychiatric:        Attention and Perception: Attention and perception normal. He does not perceive auditory or visual hallucinations.        Mood and Affect: Mood and affect normal. Mood is not anxious or depressed. Affect is not  labile.        Speech: Speech normal.        Behavior: Behavior normal. Behavior is cooperative.        Thought Content: Thought content normal.        Cognition and Memory: Cognition and memory normal.        Judgment: Judgment normal.     Lab Review:     Component Value Date/Time   NA 134 (L) 05/31/2023 1229   K 4.1 05/31/2023 1229   CL 102 05/31/2023 1229   CO2 24 05/31/2023 1229   GLUCOSE 193 (H) 05/31/2023 1229   BUN 26 (H) 05/31/2023 1229   CREATININE 1.24 05/31/2023 1229   CALCIUM  9.3 05/31/2023 1229   PROT 8.2 (H) 01/14/2023 0914   ALBUMIN 4.2 01/14/2023 0914   AST 24 01/14/2023 0914   ALT 19 01/14/2023 0914   ALKPHOS 91 01/14/2023 0914   BILITOT 1.1 01/14/2023 0914   GFRNONAA >60 05/31/2023 1229       Component Value Date/Time   WBC 9.2 05/31/2023 1229   RBC 5.43 05/31/2023 1229   HGB 16.7 05/31/2023 1229   HCT 49.8 05/31/2023 1229   PLT 209 05/31/2023 1229   MCV 91.7 05/31/2023 1229   MCH 30.8 05/31/2023 1229   MCHC 33.5 05/31/2023 1229   RDW 15.5 05/31/2023 1229   LYMPHSABS 1.8 01/14/2023 0914   MONOABS 0.6 01/14/2023 0914   EOSABS 0.2 01/14/2023 0914   BASOSABS 0.1 01/14/2023 0914    No results found for: POCLITH, LITHIUM   No results found for: PHENYTOIN, PHENOBARB, VALPROATE, CBMZ   .res Assessment: Plan:   Recommendations:   Continue Spravato treatment. Pt did not have any questions or concerns  today. Tolerated well. Rates depression today 3/10.    Explained to pt that treatment takes time to work and it is to early to notice  improvement.  He acknowledges understanding.    Redell A. 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  Future Appointments  Date Time Provider Department Center  06/04/2024  9:30 AM CP-NURSE [facility] None  06/04/2024  9:30 AM Teresa Redell LABOR, NP [facility] None  06/06/2024 11:15 AM Tobie Franky SQUIBB, DPM [facility]  06/06/2024  1:00 PM Rhys Boyer T, PA-C [facility] None  06/06/2024  1:00 PM CP-NURSE [facility] None  06/18/2024 11:30 AM Saucier, Dorn Ruth, NP [facility] None    No orders of the defined types were placed in this encounter.   -------------------------------

## 2024-06-04 ENCOUNTER — Ambulatory Visit: Admitting: Behavioral Health

## 2024-06-04 ENCOUNTER — Ambulatory Visit

## 2024-06-04 VITALS — BP 120/72 | HR 94

## 2024-06-04 DIAGNOSIS — F329 Major depressive disorder, single episode, unspecified: Secondary | ICD-10-CM

## 2024-06-04 NOTE — Progress Notes (Signed)
 [name] [id number] Jan 17, 1956 68 y.o.  Subjective:   Patient ID:  [name] is a 68 y.o. (DOB 1956-06-06) male.  Chief Complaint: No chief complaint on file.   HPI [name] presents to the office today for follow-up of  treatment resistant depression (TRD).   Spravato treatment    Patient was administered Spravato 54 mg intranasally today. Patient was observed by provider throughout Spravato treatment. The patient experienced the typical dissociation which gradually resolved over the 2-hour period of observation. There were no complications. Specifically, the patient did not have any untoward side effects - feeling disconnected from themself, their thoughts, feelings and things around them, dizziness, nausea, feeling sleepy, decreased feeling of sensitivity (numbness) spinning sensation, feeling anxious, lack of energy, increased blood pressure, feeling happy or very excited, or headache. Blood pressures remained within normal ranges at the 40-minute and 2-hour follow-up intervals. By the time the 2-hour observation period was met the patient was alert and oriented and able to exit without assistance. Patient willing to continue Spravato administration for the treatment of resistant depression.   Refer to nursing notes for further.   Pt says that he does not see any benefit with the medication and is not feeling any improvement.  He wishes to discontinue the treatment and will be referred back to psychiatric provider for continued medication management.           PHQ2-9    Flowsheet Row Office Visit from 04/18/2024 in [facility]  PHQ-2 Total Score 6  PHQ-9 Total Score 18   Flowsheet Row Admission (Discharged) from 06/05/2023 in [facility] ED from 05/15/2023 in [facility] Emergency Department at [facility] ED from 01/14/2023 in [facility] Emergency Department at [facility]  C-SSRS RISK  CATEGORY No Risk No Risk No Risk     Review of Systems:  Review of Systems  Constitutional: Negative.   Allergic/Immunologic: Negative.   Neurological: Negative.   Psychiatric/Behavioral:  Positive for dysphoric mood.     Medications: I have reviewed the patient's current medications.  Current Outpatient Medications  Medication Sig Dispense Refill   allopurinol  (ZYLOPRIM ) 300 MG tablet Take 300 mg by mouth daily.     ammonium lactate (LAC-HYDRIN) 12 % lotion Apply topically.     atorvastatin  (LIPITOR) 80 MG tablet Take 1 tablet (80 mg total) by mouth daily. 90 tablet 0   B-D UF III MINI PEN NEEDLES 31G X 5 MM MISC Inject 1 each into the skin 3 (three) times daily.     BD DISP NEEDLES 18G X 1-1/2 MISC      BD PLASTIPAK SYRINGE 21G X 1 3 ML MISC      BD PLASTIPAK SYRINGE 3 ML MISC      BD PRECISIONGLIDE NEEDLE 23G X 1-1/2 MISC      buPROPion  (WELLBUTRIN  XL) 150 MG 24 hr tablet Take 1 tablet (150 mg total) by mouth daily. 90 tablet 0   collagenase  (SANTYL ) 250 UNIT/GM ointment Apply 1 Application topically daily. Wound measurement 0.3 cm x 0.3 cm x 0.3 cm 30 g 0   Continuous Glucose Receiver (FREESTYLE LIBRE 14 DAY READER) DEVI Use 1 Device as directed     Continuous Glucose Sensor (FREESTYLE LIBRE 3 SENSOR) MISC      cyanocobalamin  100 MCG tablet Take 100 mcg by mouth daily.     doxycycline  (VIBRAMYCIN ) 100 MG capsule Take 100 mg by mouth 2 (two) times daily.     empagliflozin (JARDIANCE)  25 MG TABS tablet Take 25 mg by mouth daily.     escitalopram  (LEXAPRO ) 20 MG tablet Take 1 tablet (20 mg total) by mouth daily. 90 tablet 1   gabapentin  (NEURONTIN ) 400 MG capsule Take 400 mg by mouth 3 (three) times daily.     HYDROcodone -acetaminophen  (NORCO/VICODIN) 5-325 MG tablet Take 1 tablet by mouth.     LANTUS  SOLOSTAR 100 UNIT/ML Solostar Pen Inject 6 Units into the skin at bedtime.     lisinopril  (ZESTRIL ) 5 MG tablet Take 5 mg by mouth daily.     metFORMIN (GLUCOPHAGE) 500 MG tablet  Take 1,000 mg by mouth 2 (two) times daily with a meal.     methocarbamol  (ROBAXIN ) 500 MG tablet Take 1 tablet (500 mg total) by mouth every 6 (six) hours as needed for muscle spasms. 120 tablet 0   mupirocin ointment (BACTROBAN) 2 % Apply topically 3 (three) times daily.     pravastatin  (PRAVACHOL ) 80 MG tablet Take 80 mg by mouth daily.     pregabalin (LYRICA) 200 MG capsule Take 200 mg by mouth 2 (two) times daily.     QUEtiapine  (SEROQUEL ) 25 MG tablet Take 1 tablet (25 mg total) by mouth at bedtime. 90 tablet 0   rOPINIRole  (REQUIP ) 0.5 MG tablet Take 0.5 mg by mouth at bedtime.     Semaglutide 14 MG TABS Take 14 mg by mouth daily.     sildenafil (VIAGRA) 100 MG tablet Take 100 mg by mouth daily as needed for erectile dysfunction.     testosterone cypionate (DEPOTESTOSTERONE CYPIONATE) 200 MG/ML injection Inject into the muscle every 14 (fourteen) days.     triamcinolone ointment (KENALOG) 0.1 % Apply topically 2 (two) times daily.     No current facility-administered medications for this visit.    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)    Depression    Diabetes mellitus without complication (HCC)    Diabetic infection of left foot (HCC)    Dysarthria    Gout    Hemiparesis affecting left side as late effect of cerebrovascular accident (CVA) (HCC)    HLD (hyperlipidemia)    Hyponatremia    Lactic acidosis    Left hallux osteomyelitis (HCC)    Osteomyelitis of ankle or foot, acute, left (HCC)    Stage 3a chronic kidney disease (HCC)    Stroke (HCC)    Type II diabetes mellitus with renal manifestations (HCC)     Past Medical History, Surgical history, Social history, and Family  history were reviewed and updated as appropriate.   Please see review of systems for further details on the patient's review from today.   Objective:   Physical Exam:  There were no vitals taken for this visit.  Physical Exam  Lab Review:     Component Value Date/Time   NA 134 (L) 05/31/2023 1229   K 4.1 05/31/2023 1229   CL 102 05/31/2023 1229   CO2 24 05/31/2023 1229   GLUCOSE 193 (H) 05/31/2023 1229   BUN 26 (H) 05/31/2023 1229   CREATININE 1.24 05/31/2023 1229   CALCIUM  9.3 05/31/2023 1229   PROT 8.2 (H) 01/14/2023 0914   ALBUMIN  4.2 01/14/2023 0914   AST 24 01/14/2023 0914   ALT 19 01/14/2023 0914   ALKPHOS 91 01/14/2023 0914   BILITOT 1.1 01/14/2023 0914   GFRNONAA >60 05/31/2023 1229       Component Value Date/Time   WBC 9.2 05/31/2023 1229   RBC 5.43 05/31/2023 1229   HGB 16.7 05/31/2023 1229   HCT 49.8 05/31/2023 1229   PLT 209 05/31/2023 1229   MCV 91.7 05/31/2023 1229   MCH 30.8 05/31/2023 1229   MCHC 33.5 05/31/2023 1229   RDW 15.5 05/31/2023 1229   LYMPHSABS 1.8 01/14/2023 0914   MONOABS 0.6 01/14/2023 0914   EOSABS 0.2 01/14/2023 0914   BASOSABS 0.1 01/14/2023 0914    No results found for: POCLITH, LITHIUM   No results found for: PHENYTOIN, PHENOBARB, VALPROATE, CBMZ   .res Assessment: Plan:     Recommendations:   Continue Spravato treatment. Pt did not have any questions or concerns today. Tolerated well. Rates depression today 5/10.    Discussed with pt his continued regimen with Sprivato. He continues to express no improvement or slightest change with treatment. Says that he see no reason in continuing regimen. He acknowledges understanding of continuing medication management with his psychiatric provider.    [name] A. 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  Future Appointments  Date Time Provider  Department Center  06/06/2024 11:15 AM Tobie Franky SQUIBB, DPM [facility]  06/06/2024  1:00 PM Rhys Verneita DASEN, PA-C [facility] None  06/06/2024  1:00 PM CP-NURSE [facility] None  06/18/2024 11:30 AM Saucier, Dorn Ruth, NP [facility] None    No orders of the defined types were placed in this encounter.   -------------------------------

## 2024-06-05 NOTE — Progress Notes (Signed)
 NURSES NOTE:         Pt arrived for his 5 th Spravato Treatment for treatment resistant depression, the starting dose was 56 mg (2 of the 28 mg nasal sprays) as tolerated and he reports no side effects or complaints, today he will be receiving 84 mg. Issa was a referral and is a patient of Dorn Der, NP with [employer]. They will continue to follow his psychiatric care. Redell Pizza, NP will be following his care throughout treatments and follow ups. Explained to pt how the treatments would be scheduled and answered any questions he had today. Confirmed his ride who is named Marval, informed her she did not have to stay at the clinic but to let the front office know when she returns. Also offered her to sit with pt who did not want that, or she can sit back in the Spravato waiting area. Explained how the inhaler worked, gave him a Psychologist, educational to use and practice with. Pt's Spravato is a medical authorization through buy and bill.  Spravato medication is stored at treatment center per REMS/FDA guidelines. The medication is required to be locked behind two doors per REMS/FDA protocol. Medication is also disposed of properly after each use per regulations. All documentation for REMS is completed and submitted per FDA/REMS requirements.          Began taking patient's vital signs at 9:20 AM 106/68, pulse 94, SpO2 97%. Instructed patient to blow his nose if needed then recline back to a 45 degree angle. Gave patient first dose 28 mg nasal spray, administered in each nostril as directed and observed by nurse, waited 5 more minutes for the second and third doses. After all doses given pt did not complain of any nausea/vomiting. Assessed his 40 minute vitals, 10:00 AM, 93/68, pulse 93, SpO2 97%. Pt reports doing fine, no complaints voiced  Explained he would be monitored for a total time of 120 minutes. Discharge vitals were taken at 11:16 AM 120/72, P 94, SpO2 98%. Redell Pizza, NP came to  visit with patient once his thoughts were clearer to discuss how treatment went. After discussion with pt, it was decided this would be pt's last treatment. Pt reports he feels nothing and doesn't feel he should continue. Canceled pt off schedule and he will follow up with his PCP. Recommend he go home and sleep or just relax on the couch. No driving, no intense activities. Verbalized understanding. Pt's ride, Debbie walked pt out, pt is using his scooter today which helps tremendously since his gait is so unsteady. Nurse was with pt a total of 60 minutes for clinical assessment.      LOT [id number] EXP JAN 2028

## 2024-06-06 ENCOUNTER — Encounter

## 2024-06-06 ENCOUNTER — Encounter: Admitting: Physician Assistant

## 2024-06-06 ENCOUNTER — Ambulatory Visit (INDEPENDENT_AMBULATORY_CARE_PROVIDER_SITE_OTHER): Admitting: Podiatry

## 2024-06-06 DIAGNOSIS — E119 Type 2 diabetes mellitus without complications: Secondary | ICD-10-CM

## 2024-06-06 DIAGNOSIS — L97512 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ed: Secondary | ICD-10-CM | POA: Diagnosis not present

## 2024-06-06 NOTE — Progress Notes (Signed)
 Subjective:  Patient ID: [name], male    DOB: Aug 12, 1956,  MRN: [id number]  Chief Complaint  Patient presents with   Foot Ulcer    Right foot ulcer     68 y.o. male presents for wound care.  Patient presents with right submetatarsal 1 ulcer.  He had multiple graft application which has brought the sides of the ulceration down considerably at this point has reached a point of stagnation and will transition away from the graft to something else.  Patient is in agreement   Review of Systems: Negative except as noted in the HPI. Denies N/V/F/Ch.  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)    Depression    Diabetes mellitus without complication (HCC)    Diabetic infection of left foot (HCC)    Dysarthria    Gout    Hemiparesis affecting left side as late effect of cerebrovascular accident (CVA) (HCC)    HLD (hyperlipidemia)    Hyponatremia    Lactic acidosis    Left hallux osteomyelitis (HCC)    Osteomyelitis of ankle or foot, acute, left (HCC)    Stage 3a chronic kidney disease (HCC)    Stroke (HCC)    Type II diabetes mellitus with renal manifestations (HCC)     Current Outpatient Medications:    allopurinol  (ZYLOPRIM ) 300 MG tablet, Take 300 mg by mouth daily., Disp: , Rfl:    ammonium lactate (LAC-HYDRIN) 12 % lotion, Apply topically., Disp: , Rfl:    atorvastatin  (LIPITOR) 80 MG tablet, Take 1 tablet (80 mg total) by mouth daily., Disp: 90 tablet, Rfl: 0   B-D UF III MINI PEN NEEDLES 31G X 5 MM MISC, Inject 1 each into the skin 3 (three) times daily., Disp: , Rfl:    BD DISP NEEDLES 18G X 1-1/2 MISC, , Disp: , Rfl:    BD PLASTIPAK SYRINGE 21G X 1 3 ML MISC, , Disp: , Rfl:    BD PLASTIPAK SYRINGE 3 ML MISC, , Disp: , Rfl:    BD  PRECISIONGLIDE NEEDLE 23G X 1-1/2 MISC, , Disp: , Rfl:    buPROPion  (WELLBUTRIN  XL) 150 MG 24 hr tablet, Take 1 tablet (150 mg total) by mouth daily., Disp: 90 tablet, Rfl: 0   collagenase  (SANTYL ) 250 UNIT/GM ointment, Apply 1 Application topically daily. Wound measurement 0.3 cm x 0.3 cm x 0.3 cm, Disp: 30 g, Rfl: 0   Continuous Glucose Receiver (FREESTYLE LIBRE 14 DAY READER) DEVI, Use 1 Device as directed, Disp: , Rfl:    Continuous Glucose Sensor (FREESTYLE LIBRE 3 SENSOR) MISC, , Disp: , Rfl:    cyanocobalamin  100 MCG tablet, Take 100 mcg by mouth daily., Disp: , Rfl:    doxycycline  (VIBRAMYCIN ) 100 MG capsule, Take 100 mg by mouth 2 (two) times daily., Disp: , Rfl:    empagliflozin (JARDIANCE) 25 MG TABS tablet, Take 25 mg by mouth daily., Disp: , Rfl:    escitalopram  (LEXAPRO ) 20 MG tablet, Take 1 tablet (20 mg total) by mouth daily., Disp: 90 tablet, Rfl: 1   gabapentin  (NEURONTIN ) 400 MG capsule, Take 400 mg by mouth 3 (three) times daily., Disp: , Rfl:    HYDROcodone -acetaminophen  (NORCO/VICODIN) 5-325 MG tablet, Take 1 tablet by mouth., Disp: ,  Rfl:    LANTUS  SOLOSTAR 100 UNIT/ML Solostar Pen, Inject 6 Units into the skin at bedtime., Disp: , Rfl:    lisinopril  (ZESTRIL ) 5 MG tablet, Take 5 mg by mouth daily., Disp: , Rfl:    metFORMIN (GLUCOPHAGE) 500 MG tablet, Take 1,000 mg by mouth 2 (two) times daily with a meal., Disp: , Rfl:    methocarbamol  (ROBAXIN ) 500 MG tablet, Take 1 tablet (500 mg total) by mouth every 6 (six) hours as needed for muscle spasms., Disp: 120 tablet, Rfl: 0   mupirocin ointment (BACTROBAN) 2 %, Apply topically 3 (three) times daily., Disp: , Rfl:    pravastatin  (PRAVACHOL ) 80 MG tablet, Take 80 mg by mouth daily., Disp: , Rfl:    pregabalin (LYRICA) 200 MG capsule, Take 200 mg by mouth 2 (two) times daily., Disp: , Rfl:    QUEtiapine  (SEROQUEL ) 25 MG tablet, Take 1 tablet (25 mg total) by mouth at bedtime., Disp: 90 tablet, Rfl: 0   rOPINIRole  (REQUIP ) 0.5  MG tablet, Take 0.5 mg by mouth at bedtime., Disp: , Rfl:    Semaglutide 14 MG TABS, Take 14 mg by mouth daily., Disp: , Rfl:    sildenafil (VIAGRA) 100 MG tablet, Take 100 mg by mouth daily as needed for erectile dysfunction., Disp: , Rfl:    testosterone cypionate (DEPOTESTOSTERONE CYPIONATE) 200 MG/ML injection, Inject into the muscle every 14 (fourteen) days., Disp: , Rfl:    triamcinolone ointment (KENALOG) 0.1 %, Apply topically 2 (two) times daily., Disp: , Rfl:   Social History   Tobacco Use  Smoking Status Former   Current packs/day: 0.00   Types: Cigarettes   Quit date: 10/07/1997   Years since quitting: 26.6  Smokeless Tobacco Never    No Known Allergies Objective:  There were no vitals filed for this visit. There is no height or weight on file to calculate BMI. Constitutional Well developed. Well nourished.  Vascular Dorsalis pedis pulses palpable bilaterally. Posterior tibial pulses palpable bilaterally. Capillary refill normal to all digits.  No cyanosis or clubbing noted. Pedal hair growth normal.  Neurologic Normal speech. Oriented to person, place, and time. Protective sensation absent  Dermatologic Wound Location: Right submetatarsal 1 ulcer fat layer exposed Wound Base: Mixed Granular/Fibrotic Peri-wound: Calloused Exudate: Scant/small amount Serosanguinous exudate Wound Measurements: - See above  Orthopedic: No pain to palpation either foot.   Radiographs: None Assessment:   1. Right foot ulcer, with fat layer exposed (HCC)   2. Type 2 diabetes mellitus without complication, unspecified whether long term insulin  use (HCC)    Plan:  Patient was evaluated and treated and all questions answered.  Ulcer right submet 1 ulcer fat layer exposed -Debridement as below. -Dressed with Santyl , DSD. -Continue off-loading with surgical shoe. - Will transition the patient to Santyl  wet-to-dry - Patient has failed  graft application and will therefore hold off  on any further grafts.  However patient's wound has decreased considerably since from the application of graft  Procedure: Excisional Debridement of Wound Tool: Sharp chisel blade/tissue nipper Rationale: Removal of non-viable soft tissue from the wound to promote healing.  Anesthesia: none Pre-Debridement Wound Measurements: 0.3 cm x by 0.3 cm x 0.3  Post-Debridement Wound Measurements: 0.3 cm x by 0.3 cm x 0.3  Type of Debridement: Sharp Excisional Tissue Removed: Non-viable soft tissue Blood loss: Minimal (<50cc) Depth of Debridement: subcutaneous tissue. Technique: Sharp excisional debridement to bleeding, viable wound base.  Wound Progress: Will continue to monitor the wound under Santyl  Site  healing conversation 7 Dressing: Dry, sterile, compression dressing. Disposition: Patient tolerated procedure well. Patient to return in 1 week for follow-up.  No follow-ups on file.

## 2024-06-12 ENCOUNTER — Other Ambulatory Visit: Payer: Self-pay | Admitting: Psychiatry

## 2024-06-12 ENCOUNTER — Telehealth: Payer: Self-pay

## 2024-06-12 NOTE — Telephone Encounter (Signed)
 Received fax from patients pharmacy requesting a refill of buPROPion  (WELLBUTRIN  XL) 150 MG 24 hr tablet   Last visit 05-14-24  Next visit 06-18-24    Preferred Pharmacies   [facility] - [place], [place] - [address] Phone: [phone]  Fax: [phone]

## 2024-06-13 ENCOUNTER — Other Ambulatory Visit: Payer: Self-pay | Admitting: Psychiatry

## 2024-06-13 DIAGNOSIS — F331 Major depressive disorder, recurrent, moderate: Secondary | ICD-10-CM

## 2024-06-13 MED ORDER — BUPROPION HCL ER (XL) 150 MG PO TB24
150.0000 mg | ORAL_TABLET | Freq: Every day | ORAL | 1 refills | Status: AC
Start: 2024-06-13 — End: ?

## 2024-06-18 ENCOUNTER — Ambulatory Visit: Admitting: Psychiatry

## 2024-06-20 ENCOUNTER — Telehealth (INDEPENDENT_AMBULATORY_CARE_PROVIDER_SITE_OTHER): Admitting: Psychiatry

## 2024-06-20 DIAGNOSIS — F4310 Post-traumatic stress disorder, unspecified: Secondary | ICD-10-CM | POA: Diagnosis not present

## 2024-06-20 DIAGNOSIS — F411 Generalized anxiety disorder: Secondary | ICD-10-CM | POA: Diagnosis not present

## 2024-06-20 DIAGNOSIS — F331 Major depressive disorder, recurrent, moderate: Secondary | ICD-10-CM | POA: Diagnosis not present

## 2024-06-20 NOTE — Progress Notes (Signed)
 BH MD/PA/NP OP Progress Note  06/20/2024 8:59 AM [name]  MRN:  [id number]  Chief Complaint: Routine Followup  Virtual Visit via Video Note  I connected with [name] on 06/20/24 at  9:00 AM EDT by a video enabled telemedicine application and verified that I am speaking with the correct person using two identifiers.  Location: Patient: [address]  [place] [address]  Provider: [facility]   I discussed the limitations of evaluation and management by telemedicine and the availability of in person appointments. The patient expressed understanding and agreed to proceed.    I discussed the assessment and treatment plan with the patient. The patient was provided an opportunity to ask questions and all were answered. The patient agreed with the plan and demonstrated an understanding of the instructions.   The patient was advised to call back or seek an in-person evaluation if the symptoms worsen or if the condition fails to improve as anticipated.  I provided 30 minutes of non-face-to-face time during this encounter.   Dorn Jama Der, NP   HPI: 68 year old male presenting [facility] for follow-up.  Patient reports that he is doing Spravato treatments with 5 treatments within the past month but states that quieter stopped treatment.  When reviewing the notes it appears that the provider of Spravato recommended for continued treatment as patient is actually, that discontinue treatment stating that after 5 treatments he has not felt difference in requested to stop.  Patient today reports that he has fallen 3 times since the last visit stating that his falling is due to his poor balance.  It is becoming more difficult due to his peripheral neuropathy and challenges with his gait.  Patient reports that he knows and understands that he should utilize a wheelchair and a walker in which he has fixed his motorized wheelchair as well as utilizing a walker.  Patient  has been educated the does increase his risk of ending up with permanent disabilities or injuries that could result in hospitalization when she verbalized understanding.  Patient reports that he can is okay with treatment at this time stating the medication regimen is doing well stating that he is not sure about the Lexapro  dosage and when she was educated get that he is taking 40 mg once daily.  Patient stated he did not respond Spravato and does not want to continue with this provider as well.  Patient is to continue medication management.  Patient reports that he has been engaging with social activities with his friends as well as taking care of himself with trying to modify his house to be more accommodating.  Patient reports that he does have rampant outs and also reports that he has tried to get about in which he can walk.  Patient states he is in a rather good mood today and states that he is just cannot deal with the depression and anxiety and PTSD with medication management and enjoys meeting with this provider.  Based on this assessment interview is recommended for the patient to continue medications as originally planned.  See plan.  Patient is in agreement with treatment plan.  Patient with no other questions or concerns at this time.  Patient to follow up in 1 month. Visit Diagnosis:    ICD-10-CM   1. Major depressive disorder, recurrent episode, moderate (HCC)  F33.1     2. Generalized anxiety disorder  F41.1     3. PTSD (post-traumatic stress disorder)  F43.10  Past Psychiatric History:  Previous Psych Hospitalizations:  -2023, Previously hospitalized for 4 weeks, with ECT Therapy.    Previous Dx: MDD, PTSD.   Medications Current: -Seroquel  25 mg once daily before bed. -Wellbutrin  150 mg once daily -Lexapro  20mg  once daily   Medication Trials:  - Haldol, over sedating, stopped by patient, in the [employer] - Zoloft - poor response, in the [employer] - Prozac - poor response,  in the [employer] - Zyprexa - disturbed thinking, stopped by patient, in the [employer] - Completed a course of TMS in 1999.    Suicide & Violence: Denies SI, HI.  Denies having a firearm within the home   Psychotherapy: Denies and states he is not interested in in therapy.   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)    Depression    Diabetes mellitus without complication (HCC)    Diabetic infection of left foot (HCC)    Dysarthria    Gout    Hemiparesis affecting left side as late effect of cerebrovascular accident (CVA) (HCC)    HLD (hyperlipidemia)    Hyponatremia    Lactic acidosis    Left hallux osteomyelitis (HCC)    Osteomyelitis of ankle or foot, acute, left (HCC)    Stage 3a chronic kidney disease (HCC)    Stroke (HCC)    Type II diabetes mellitus with renal manifestations [facility])     Past Surgical History:  Procedure Laterality Date   ACHILLES TENDON SURGERY Left 08/18/2022   Procedure: ACHILLES LENGTHENING/KIDNER;  Surgeon: Tobie Franky SQUIBB, DPM;  Location: [facility];  Service: Podiatry;  Laterality: Left;   AMPUTATION TOE Left 12/16/2021   Procedure: AMPUTATION TOE - Left Great;  Surgeon: Neill Boas, DPM;  Location: [facility];  Service: Podiatry;  Laterality: Left;   AMPUTATION TOE Left 01/14/2022   Procedure: GREAT TOE AMPUTATION;  Surgeon: Neill Boas, DPM;  Location: [facility];  Service: Podiatry;  Laterality: Left;   APPENDECTOMY  1966   CATARACT EXTRACTION W/PHACO Left 05/24/2022   Procedure: CATARACT EXTRACTION PHACO AND INTRAOCULAR LENS PLACEMENT (IOC) LEFT 7.63 00:42.1;  Surgeon: Jaye Fallow, MD;  Location: [facility];  Service: Ophthalmology;  Laterality: Left;  Diabetic    CATARACT EXTRACTION W/PHACO Right 06/07/2022   Procedure: CATARACT EXTRACTION PHACO AND INTRAOCULAR LENS PLACEMENT (IOC) RIGHT;  Surgeon: Jaye Fallow, MD;  Location: [facility];  Service: Ophthalmology;  Laterality: Right;  7.33 0:45.3   COLONOSCOPY     LAPAROSCOPIC GASTRIC BANDING     POSTERIOR CERVICAL LAMINECTOMY Right 06/05/2023   Procedure: RIGHT MINIMALLY INVASIVE (MIS) C7-T1 DISCECTOMY;  Surgeon: Claudene Penne Lin, MD;  Location: [facility];  Service: Neurosurgery;  Laterality: Right;   TRANSMETATARSAL AMPUTATION Left 08/18/2022   Procedure: TRANSMETATARSAL AMPUTATION;  Surgeon: Tobie Franky SQUIBB, DPM;  Location: [facility];  Service: Podiatry;  Laterality: Left;   WISDOM TOOTH EXTRACTION      Family Psychiatric History: No additional  Family History:  Family History  Problem Relation Age of Onset   Paranoid behavior Sister     Social History:  Social History   Socioeconomic History   Marital status: Single    Spouse name: Not on file   Number of children:  1   Years of education: 14   Highest education level: Associate degree: occupational, Scientist, product/process development, or vocational program  Occupational History   Not on file  Tobacco Use   Smoking status: Former    Current packs/day: 0.00    Types: Cigarettes    Quit date: 10/07/1997    Years since quitting: 26.7   Smokeless tobacco: Never  Vaping Use   Vaping status: Never Used  Substance and Sexual Activity   Alcohol use: Never   Drug use: Never   Sexual activity: Not Currently  Other Topics Concern   Not on file  Social History Narrative   LIVES ALONE  in [place]     Social Drivers of Health   Financial Resource Strain: Low Risk  (03/18/2024)   Received from [facility]   Overall Financial Resource Strain (CARDIA)    Difficulty of Paying Living Expenses: Not hard at all  Food Insecurity: No Food Insecurity (03/18/2024)   Received from [facility]   Hunger Vital Sign     Within the past 12 months, you worried that your food would run out before you got the money to buy more.: Never true    Within the past 12 months, the food you bought just didn't last and you didn't have money to get more.: Never true  Transportation Needs: No Transportation Needs (03/18/2024)   Received from [facility] - Transportation    In the past 12 months, has lack of transportation kept you from medical appointments or from getting medications?: No    Lack of Transportation (Non-Medical): No  Physical Activity: Inactive (11/21/2023)   Received from [facility]   Exercise Vital Sign    On average, how many days per week do you engage in moderate to strenuous exercise (like a brisk walk)?: 0 days    On average, how many minutes do you engage in exercise at this level?: 0 min  Stress: No Stress Concern Present (11/21/2023)   Received from [facility] - Occupational Stress Questionnaire    Feeling of Stress : Only a little  Social Connections: Socially Isolated (11/21/2023)   Received from [facility]   Social Connection and Isolation Panel    In a typical week, how many times do you talk on the phone with family, friends, or neighbors?: Never    How often do you get together with friends or relatives?: Once a week    How often do you attend church or religious services?: Never    Do you belong to any clubs or organizations such as church groups, unions, fraternal or athletic groups, or school groups?: No    How often do you attend meetings of the clubs or organizations you belong to?: Never    Are you married, widowed, divorced, separated, never married, or living with a partner?: Divorced    Allergies: No Known Allergies  Metabolic Disorder Labs: Lab Results  Component Value Date   HGBA1C 7.0 (H) 08/19/2022   MPG 154.2 08/19/2022   MPG 139.85 12/16/2021    No results found for: PROLACTIN Lab Results  Component Value Date   CHOL 130 12/06/2021   TRIG 84 12/06/2021   HDL 27 (L) 12/06/2021   CHOLHDL 4.8 12/06/2021   VLDL 17 12/06/2021   LDLCALC 86 12/06/2021   No results found for: TSH  Therapeutic Level Labs: No results found  for: LITHIUM No results found for: VALPROATE No results found for: CBMZ  Current Medications: Current Outpatient Medications  Medication Sig Dispense Refill   allopurinol  (ZYLOPRIM ) 300 MG tablet Take 300 mg by mouth daily.     ammonium lactate (LAC-HYDRIN) 12 % lotion Apply topically.     atorvastatin  (LIPITOR) 80 MG tablet Take 1 tablet (80 mg total) by mouth daily. 90 tablet 0   B-D UF III MINI PEN NEEDLES 31G X 5 MM MISC Inject 1 each into the skin 3 (three) times daily.     BD DISP NEEDLES 18G X 1-1/2 MISC      BD PLASTIPAK SYRINGE 21G X 1 3 ML MISC      BD PLASTIPAK SYRINGE 3 ML MISC      BD PRECISIONGLIDE NEEDLE 23G X 1-1/2 MISC      buPROPion  (WELLBUTRIN  XL) 150 MG 24 hr tablet Take 1 tablet (150 mg total) by mouth daily. 90 tablet 1   collagenase  (SANTYL ) 250 UNIT/GM ointment Apply 1 Application topically daily. Wound measurement 0.3 cm x 0.3 cm x 0.3 cm 30 g 0   Continuous Glucose Receiver (FREESTYLE LIBRE 14 DAY READER) DEVI Use 1 Device as directed     Continuous Glucose Sensor (FREESTYLE LIBRE 3 SENSOR) MISC      cyanocobalamin  100 MCG tablet Take 100 mcg by mouth daily.     doxycycline  (VIBRAMYCIN ) 100 MG capsule Take 100 mg by mouth 2 (two) times daily.     empagliflozin (JARDIANCE) 25 MG TABS tablet Take 25 mg by mouth daily.     escitalopram  (LEXAPRO ) 20 MG tablet Take 1 tablet (20 mg total) by mouth daily. 90 tablet 1   gabapentin  (NEURONTIN ) 400 MG capsule Take 400 mg by mouth 3 (three) times daily.     HYDROcodone -acetaminophen  (NORCO/VICODIN) 5-325 MG tablet Take 1 tablet by mouth.     LANTUS  SOLOSTAR 100 UNIT/ML Solostar Pen Inject 6 Units into the skin at bedtime.      lisinopril  (ZESTRIL ) 5 MG tablet Take 5 mg by mouth daily.     metFORMIN (GLUCOPHAGE) 500 MG tablet Take 1,000 mg by mouth 2 (two) times daily with a meal.     methocarbamol  (ROBAXIN ) 500 MG tablet Take 1 tablet (500 mg total) by mouth every 6 (six) hours as needed for muscle spasms. 120 tablet 0   mupirocin ointment (BACTROBAN) 2 % Apply topically 3 (three) times daily.     pravastatin  (PRAVACHOL ) 80 MG tablet Take 80 mg by mouth daily.     pregabalin (LYRICA) 200 MG capsule Take 200 mg by mouth 2 (two) times daily.     QUEtiapine  (SEROQUEL ) 25 MG tablet Take 1 tablet (25 mg total) by mouth at bedtime. 90 tablet 0   rOPINIRole  (REQUIP ) 0.5 MG tablet Take 0.5 mg by mouth at bedtime.     Semaglutide 14 MG TABS Take 14 mg by mouth daily.     sildenafil (VIAGRA) 100 MG tablet Take 100 mg by mouth daily as needed for erectile dysfunction.     testosterone cypionate (DEPOTESTOSTERONE CYPIONATE) 200 MG/ML injection Inject into the muscle every 14 (fourteen) days.     triamcinolone ointment (KENALOG) 0.1 % Apply topically 2 (two) times daily.     No current facility-administered medications for this visit.     Musculoskeletal: Strength & Muscle Tone: decreased Gait & Station: unsteady, broad based Patient leans: N/A  Psychiatric Specialty Exam: Review of Systems  Constitutional: Negative.   HENT: Negative.    Eyes:  Negative.   Respiratory: Negative.    Cardiovascular: Negative.   Gastrointestinal: Negative.   Endocrine: Negative.   Genitourinary: Negative.   Musculoskeletal: Negative.   Skin: Negative.   Allergic/Immunologic: Negative.   Neurological: Negative.   Hematological: Negative.   Psychiatric/Behavioral:  Positive for dysphoric mood.     There were no vitals taken for this visit.There is no height or weight on file to calculate BMI.  General Appearance: Well Groomed  Eye Contact:  Good  Speech:  Clear and Coherent  Volume:  Normal  Mood:  Depressed  Affect:  Appropriate   Thought Process:  Coherent  Orientation:  Full (Time, Place, and Person)  Thought Content: Logical   Suicidal Thoughts:  No  Homicidal Thoughts:  No  Memory:  Immediate;   Good Recent;   Good Remote;   Good  Judgement:  Good  Insight:  Good  Psychomotor Activity:  Normal  Concentration:  Concentration: Good and Attention Span: Good  Recall:  Good  Fund of Knowledge: Good  Language: Good  Akathisia:  No  Handed:  Right  AIMS (if indicated): not done  Assets:  Desire for Improvement  ADL's:  Intact  Cognition: WNL  Sleep:  Good   Screenings: GAD-7    Flowsheet Row Office Visit from 05/14/2024 in [facility] Office Visit from 04/16/2024 in [facility] Office Visit from 03/11/2024 in [facility]  Total GAD-7 Score 7 14 14    PHQ2-9    Flowsheet Row Office Visit from 05/14/2024 in [facility] Office Visit from 04/16/2024 in [facility] Office Visit from 03/11/2024 in [facility] Office Visit from 02/08/2024 in [facility] Office Visit from 01/10/2024 in [facility]  PHQ-2 Total Score 2 6 6 6 4   PHQ-9 Total Score 9 24 25 24 14    Flowsheet Row Office Visit from 05/14/2024 in [facility] Office Visit from 04/16/2024 in [facility] Office Visit from 03/11/2024 in [facility]  C-SSRS RISK CATEGORY No Risk No Risk No Risk     Assessment and Plan: Assessment - Diagnosis: Major depressive disorder, recurrent episode, moderate (HCC) [F33.1]  2. PTSD (post-traumatic stress disorder) [F43.10]  Differential Diagnosis: Depression related to medical  condition Peripheral neuropathy  - Progress: PT reports no change in his symptoms, of depression reporting that 10mg  of Lexapro  is not improving his symptoms.   - Risk Factors: Risk for suicide identified due to major depressive disorder diagnosis.  Plan - Medications:  Continue Seroquel  25 mg once daily at night for sleep, prescribed by Glenda fields Continue taking Wellbutrin  150 mg once daily, prescribed by Glenda fields Continue Lexapro  to 20 mg once daily for depressive symptoms.  Patient has also educated to notify the provider of confusion or movements right away due to possible serotonin syndrome with the relationship between Seroquel  and Lexapro .  - Psychotherapy: Patient requesting to participate in therapy and symptom management with this provider. - Education: Patient educated on worsening symptoms of MDD as well as PTSD, patient was shared with resources of talk therapy and referral list in which he declined and states that he would prefer to work with a psychiatric provider and symptom management. - Follow-Up: Every 4 weeks for symptom management and therapy - Referrals: No referrals - Safety  Planning: Patient currently denies SI, HI.  Patient agrees to safety plan of reaching out to the clinic should he have worsening symptoms of depression.  Patient also notified and instructed to call 911 or go to the closest hospital should the patient experience suicidal thoughts with or without a plan.  Patient endorses is having no firearms in the home.  Patient is in agreement with safety plan  Patient/Guardian was advised Release of Information must be obtained prior to any record release in order to collaborate their care with an outside provider. Patient/Guardian was advised if they have not already done so to contact the registration department to sign all necessary forms in order for us  to release information regarding their care.   Consent: Patient/Guardian gives verbal consent for  treatment and assignment of benefits for services provided during this visit. Patient/Guardian expressed understanding and agreed to proceed.    Dorn Jama Der, NP 06/20/2024, 8:59 AM

## 2024-07-03 ENCOUNTER — Other Ambulatory Visit: Payer: Self-pay | Admitting: Podiatry

## 2024-07-18 ENCOUNTER — Ambulatory Visit (INDEPENDENT_AMBULATORY_CARE_PROVIDER_SITE_OTHER): Admitting: Podiatry

## 2024-07-18 DIAGNOSIS — L97512 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ed: Secondary | ICD-10-CM | POA: Diagnosis not present

## 2024-07-18 NOTE — Progress Notes (Signed)
 Subjective:  Patient ID: [name], male    DOB: [birth year],  MRN: [id number]  Chief Complaint  Patient presents with   Foot Ulcer    Right foot     68 y.o. male presents for wound care.  Patient presents with right submetatarsal 1 ulcer.  He states he is doing okay.  The wound may have gotten a little bit bigger denies any other acute complaints  Review of Systems: Negative except as noted in the HPI. Denies N/V/F/Ch.  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)    Depression    Diabetes mellitus without complication (HCC)    Diabetic infection of left foot (HCC)    Dysarthria    Gout    Hemiparesis affecting left side as late effect of cerebrovascular accident (CVA) (HCC)    HLD (hyperlipidemia)    Hyponatremia    Lactic acidosis    Left hallux osteomyelitis (HCC)    Osteomyelitis of ankle or foot, acute, left (HCC)    Stage 3a chronic kidney disease (HCC)    Stroke (HCC)    Type II diabetes mellitus with renal manifestations (HCC)     Current Outpatient Medications:    allopurinol  (ZYLOPRIM ) 300 MG tablet, Take 300 mg by mouth daily., Disp: , Rfl:    ammonium lactate (LAC-HYDRIN) 12 % lotion, Apply topically., Disp: , Rfl:    atorvastatin  (LIPITOR) 80 MG tablet, Take 1 tablet (80 mg total) by mouth daily., Disp: 90 tablet, Rfl: 0   B-D UF III MINI PEN NEEDLES 31G X 5 MM MISC, Inject 1 each into the skin 3 (three) times daily., Disp: , Rfl:    BD DISP NEEDLES 18G X 1-1/2 MISC, , Disp: , Rfl:    BD PLASTIPAK SYRINGE 21G X 1 3 ML MISC, , Disp: , Rfl:    BD PLASTIPAK SYRINGE 3 ML MISC, , Disp: , Rfl:    BD PRECISIONGLIDE NEEDLE 23G X 1-1/2 MISC, , Disp: , Rfl:    buPROPion  (WELLBUTRIN  XL) 150 MG 24 hr tablet, Take 1 tablet (150 mg total)  by mouth daily., Disp: 90 tablet, Rfl: 1   Continuous Glucose Receiver (FREESTYLE LIBRE 14 DAY READER) DEVI, Use 1 Device as directed, Disp: , Rfl:    Continuous Glucose Sensor (FREESTYLE LIBRE 3 SENSOR) MISC, , Disp: , Rfl:    cyanocobalamin  100 MCG tablet, Take 100 mcg by mouth daily., Disp: , Rfl:    doxycycline  (VIBRAMYCIN ) 100 MG capsule, Take 100 mg by mouth 2 (two) times daily., Disp: , Rfl:    empagliflozin (JARDIANCE) 25 MG TABS tablet, Take 25 mg by mouth daily., Disp: , Rfl:    escitalopram  (LEXAPRO ) 20 MG tablet, Take 1 tablet (20 mg total) by mouth daily., Disp: 90 tablet, Rfl: 1   gabapentin  (NEURONTIN ) 400 MG capsule, Take 400 mg by mouth 3 (three) times daily., Disp: , Rfl:    HYDROcodone -acetaminophen  (NORCO/VICODIN) 5-325 MG tablet, Take 1 tablet by mouth., Disp: , Rfl:    LANTUS  SOLOSTAR 100 UNIT/ML Solostar Pen, Inject 6 Units into the skin at bedtime., Disp: , Rfl:    lisinopril  (ZESTRIL ) 5 MG tablet, Take 5 mg by mouth daily., Disp: , Rfl:    metFORMIN (GLUCOPHAGE) 500 MG tablet, Take  1,000 mg by mouth 2 (two) times daily with a meal., Disp: , Rfl:    methocarbamol  (ROBAXIN ) 500 MG tablet, Take 1 tablet (500 mg total) by mouth every 6 (six) hours as needed for muscle spasms., Disp: 120 tablet, Rfl: 0   mupirocin ointment (BACTROBAN) 2 %, Apply topically 3 (three) times daily., Disp: , Rfl:    pravastatin  (PRAVACHOL ) 80 MG tablet, Take 80 mg by mouth daily., Disp: , Rfl:    pregabalin (LYRICA) 200 MG capsule, Take 200 mg by mouth 2 (two) times daily., Disp: , Rfl:    QUEtiapine  (SEROQUEL ) 25 MG tablet, Take 1 tablet (25 mg total) by mouth at bedtime., Disp: 90 tablet, Rfl: 0   rOPINIRole  (REQUIP ) 0.5 MG tablet, Take 0.5 mg by mouth at bedtime., Disp: , Rfl:    SANTYL  250 UNIT/GM ointment, APPLY 1 APPLICATION TOPICALLY DAILY (WOUND MEASUREMENT 0.3 CM X 0.3 CM X 0.3 CM), Disp: 30 g, Rfl: 11   Semaglutide 14 MG TABS, Take 14 mg by mouth daily., Disp: , Rfl:    sildenafil  (VIAGRA) 100 MG tablet, Take 100 mg by mouth daily as needed for erectile dysfunction., Disp: , Rfl:    testosterone cypionate (DEPOTESTOSTERONE CYPIONATE) 200 MG/ML injection, Inject into the muscle every 14 (fourteen) days., Disp: , Rfl:    triamcinolone ointment (KENALOG) 0.1 %, Apply topically 2 (two) times daily., Disp: , Rfl:   Social History   Tobacco Use  Smoking Status Former   Current packs/day: 0.00   Types: Cigarettes   Quit date: 10/07/1997   Years since quitting: 26.7  Smokeless Tobacco Never    No Known Allergies Objective:  There were no vitals filed for this visit. There is no height or weight on file to calculate BMI. Constitutional Well developed. Well nourished.  Vascular Dorsalis pedis pulses palpable bilaterally. Posterior tibial pulses palpable bilaterally. Capillary refill normal to all digits.  No cyanosis or clubbing noted. Pedal hair growth normal.  Neurologic Normal speech. Oriented to person, place, and time. Protective sensation absent  Dermatologic Wound Location: Right submetatarsal 1 ulcer fat layer exposed Wound Base: Mixed Granular/Fibrotic Peri-wound: Calloused Exudate: Scant/small amount Serosanguinous exudate Wound Measurements: - See above  Orthopedic: No pain to palpation either foot.   Radiographs: None Assessment:   No diagnosis found.  Plan:  Patient was evaluated and treated and all questions answered.  Ulcer right submet 1 ulcer fat layer exposed -Debridement as below. -Dressed with Betadine wet-to-dry dressing, DSD. -Continue off-loading with surgical shoe. - We will hold off of Santyl  for now. - Patient has failed  graft application and will therefore hold off on any further grafts.  However patient's wound has decreased considerably since from the application of graft  Procedure: Excisional Debridement of Wound Tool: Sharp chisel blade/tissue nipper Rationale: Removal of non-viable soft tissue from the wound to  promote healing.  Anesthesia: none Pre-Debridement Wound Measurements: 1 cm x 1 cm x 1 cm Post-Debridement Wound Measurements: 1.2 cm x 1.2 cm x 0.3 cm Type of Debridement: Sharp Excisional Tissue Removed: Non-viable soft tissue Blood loss: Minimal (<50cc) Depth of Debridement: subcutaneous tissue. Technique: Sharp excisional debridement to bleeding, viable wound base.  Wound Progress: The wound increased over the last few weeks Site healing conversation 7 Dressing: Dry, sterile, compression dressing. Disposition: Patient tolerated procedure well. Patient to return in 1 week for follow-up.  No follow-ups on file.

## 2024-07-23 ENCOUNTER — Encounter: Payer: Self-pay | Admitting: Psychiatry

## 2024-07-23 ENCOUNTER — Ambulatory Visit (INDEPENDENT_AMBULATORY_CARE_PROVIDER_SITE_OTHER): Admitting: Psychiatry

## 2024-07-23 ENCOUNTER — Other Ambulatory Visit: Payer: Self-pay

## 2024-07-23 ENCOUNTER — Telehealth: Payer: Self-pay

## 2024-07-23 VITALS — BP 103/65 | HR 8 | Temp 97.4°F | Ht 74.0 in | Wt 225.0 lb

## 2024-07-23 DIAGNOSIS — F331 Major depressive disorder, recurrent, moderate: Secondary | ICD-10-CM

## 2024-07-23 DIAGNOSIS — F411 Generalized anxiety disorder: Secondary | ICD-10-CM | POA: Diagnosis not present

## 2024-07-23 DIAGNOSIS — F4310 Post-traumatic stress disorder, unspecified: Secondary | ICD-10-CM

## 2024-07-23 NOTE — Telephone Encounter (Signed)
 Referral sent for TMS

## 2024-07-23 NOTE — Progress Notes (Signed)
 [facility] MD/PA/NP OP Progress Note  07/23/2024 10:39 AM [name]  MRN:  [id number]  Chief Complaint: Routine follow-up HPI: 68 year old male [facility].  Patient reports doing with his on his motorized scooter attempting to come into the room without little to no difficulty.  Patient appears to be a good mood compared to other days in which he was asked about his mood in which he states that he is doing well.  Patient reports that he has had a good week stating that no new things have happened and he feels that he is excited about his breakfast tomorrow with his friends and reports that the motorized scooter has been serving him very well.  Patient does report that in his opinion he should not have to pay for it to get fixed but that is the way of the best as he reports.  Patient states that with the depressive symptoms he states are consistently there and asked about other options.  Patient was asked about TMS in which she reports he took TMS in 66 in which he was asked if he would like to try again.  Patient reported interest in trying TMS again in which he was referred to [facility] health TMS services for intake.  Patient at this time reports no changes are needed to his medications stating that he feels good and does not feel the need of having to modify his medications stating adequate management.  Patient does endorse falling stating that he tried to get on his lawn more and fell but took a tumble the right way to not injure himself.  Patient is observed with abrasions to his left knee.  Patient denies SI, HI, AVH.  Based on this assessment interview is recommended for the patient to continue medications as prescribed.  See plan.  Patient also been referred to TMS services at [facility].  Patient with no other questions or concerns.  Patient is in agreement with treatment plan.  Patient to follow-up in 1 month. Visit Diagnosis:    ICD-10-CM   1. Major depressive disorder, recurrent  episode, moderate (HCC)  F33.1     2. Generalized anxiety disorder  F41.1     3. PTSD (post-traumatic stress disorder)  F43.10       Past Psychiatric History:  Previous Psych Hospitalizations:  -2023, Previously hospitalized for 4 weeks, with ECT Therapy.    Previous Dx: MDD, PTSD.   Medications Current: -Seroquel  25 mg once daily before bed. -Wellbutrin  150 mg once daily -Lexapro  20mg  once daily   Medication Trials:  - Haldol, over sedating, stopped by patient, in the [employer] - Zoloft - poor response, in the [employer] - Prozac - poor response, in the [employer] - Zyprexa - disturbed thinking, stopped by patient, in the [employer] - Completed a course of TMS in 1999.    Suicide & Violence: Denies SI, HI.  Denies having a firearm within the home   Psychotherapy: Denies and states he is not interested in in therapy.   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)    Depression    Diabetes mellitus without complication (HCC)    Diabetic infection of left foot (HCC)  Dysarthria    Gout    Hemiparesis affecting left side as late effect of cerebrovascular accident (CVA) (HCC)    HLD (hyperlipidemia)    Hyponatremia    Lactic acidosis    Left hallux osteomyelitis (HCC)    Osteomyelitis of ankle or foot, acute, left (HCC)    Stage 3a chronic kidney disease (HCC)    Stroke (HCC)    Type II diabetes mellitus with renal manifestations [facility])     Past Surgical History:  Procedure Laterality Date   ACHILLES TENDON SURGERY Left 08/18/2022   Procedure: ACHILLES LENGTHENING/KIDNER;  Surgeon: Tobie Franky SQUIBB, DPM;  Location: [facility] ORS;  Service: Podiatry;  Laterality: Left;   AMPUTATION TOE Left 12/16/2021   Procedure:  AMPUTATION TOE - Left Great;  Surgeon: Neill Boas, DPM;  Location: [facility] ORS;  Service: Podiatry;  Laterality: Left;   AMPUTATION TOE Left 01/14/2022   Procedure: GREAT TOE AMPUTATION;  Surgeon: Neill Boas, DPM;  Location: [facility] ORS;  Service: Podiatry;  Laterality: Left;   APPENDECTOMY  1966   CATARACT EXTRACTION W/PHACO Left 05/24/2022   Procedure: CATARACT EXTRACTION PHACO AND INTRAOCULAR LENS PLACEMENT (IOC) LEFT 7.63 00:42.1;  Surgeon: Jaye Fallow, MD;  Location: [facility];  Service: Ophthalmology;  Laterality: Left;  Diabetic   CATARACT EXTRACTION W/PHACO Right 06/07/2022   Procedure: CATARACT EXTRACTION PHACO AND INTRAOCULAR LENS PLACEMENT (IOC) RIGHT;  Surgeon: Jaye Fallow, MD;  Location: [facility];  Service: Ophthalmology;  Laterality: Right;  7.33 0:45.3   COLONOSCOPY     LAPAROSCOPIC GASTRIC BANDING     POSTERIOR CERVICAL LAMINECTOMY Right 06/05/2023   Procedure: RIGHT MINIMALLY INVASIVE (MIS) C7-T1 DISCECTOMY;  Surgeon: Claudene Penne Lin, MD;  Location: [facility] OR[facility];  Service: Neurosurgery;  Laterality: Right;   TRANSMETATARSAL AMPUTATION Left 08/18/2022   Procedure: TRANSMETATARSAL AMPUTATION;  Surgeon: Tobie Franky SQUIBB, DPM;  Location: [facility];  Service: Podiatry;  Laterality: Left;   WISDOM TOOTH EXTRACTION      Family Psychiatric History: No additional  Family History:  Family History  Problem Relation Age of Onset   Paranoid behavior Sister     Social History:  Social History   Socioeconomic History   Marital status: Single    Spouse name: Not on file   Number of children: 1   Years of education: 14   Highest education level: Associate degree: occupational, Scientist, product/process development, or vocational program  Occupational History   Not on file  Tobacco Use   Smoking status: Former    Current packs/day: 0.00    Types: Cigarettes    Quit date: 10/07/1997    Years since quitting: 26.8   Smokeless tobacco: Never  Vaping Use   Vaping status: Never Used   Substance and Sexual Activity   Alcohol use: Never   Drug use: Never   Sexual activity: Not Currently  Other Topics Concern   Not on file  Social History Narrative   LIVES ALONE  in [place]     Social Drivers of Health   Financial Resource Strain: Low Risk  (07/14/2024)   [facility] from [facility]   Overall Financial Resource Strain (CARDIA)    Difficulty of Paying Living Expenses: Not hard at all  Food Insecurity: No Food Insecurity (07/14/2024)   Received from [facility]   Hunger Vital Sign    Within the past 12 months, you worried that your food would run out before you got the money to buy more.: Never true    Within the  past 12 months, the food you bought just didn't last and you didn't have money to get more.: Never true  Transportation Needs: No Transportation Needs (07/14/2024)   Received from [facility] - Transportation    In the past 12 months, has lack of transportation kept you from medical appointments or from getting medications?: No    Lack of Transportation (Non-Medical): No  Physical Activity: Inactive (11/21/2023)   Received from [facility]   Exercise Vital Sign    On average, how many days per week do you engage in moderate to strenuous exercise (like a brisk walk)?: 0 days    On average, how many minutes do you engage in exercise at this level?: 0 min  Stress: No Stress Concern Present (11/21/2023)   Received from [facility] - Occupational Stress Questionnaire    Feeling of Stress : Only a little  Social Connections: Socially Isolated (11/21/2023)   Received from [facility]   Social Connection and Isolation Panel    In a typical week, how many times do you talk on the phone with family, friends, or neighbors?: Never    How often do you get together with friends or relatives?: Once a week     How often do you attend church or religious services?: Never    Do you belong to any clubs or organizations such as church groups, unions, fraternal or athletic groups, or school groups?: No    How often do you attend meetings of the clubs or organizations you belong to?: Never    Are you married, widowed, divorced, separated, never married, or living with a partner?: Divorced    Allergies: No Known Allergies  Metabolic Disorder Labs: Lab Results  Component Value Date   HGBA1C 7.0 (H) 08/19/2022   MPG 154.2 08/19/2022   MPG 139.85 12/16/2021   No results found for: PROLACTIN Lab Results  Component Value Date   CHOL 130 12/06/2021   TRIG 84 12/06/2021   HDL 27 (L) 12/06/2021   CHOLHDL 4.8 12/06/2021   VLDL 17 12/06/2021   LDLCALC 86 12/06/2021   No results found for: TSH  Therapeutic Level Labs: No results found for: LITHIUM No results found for: VALPROATE No results found for: CBMZ  Current Medications: Current Outpatient Medications  Medication Sig Dispense Refill   allopurinol  (ZYLOPRIM ) 300 MG tablet Take 300 mg by mouth daily.     ammonium lactate (LAC-HYDRIN) 12 % lotion Apply topically.     atorvastatin  (LIPITOR) 80 MG tablet Take 1 tablet (80 mg total) by mouth daily. 90 tablet 0   B-D UF III MINI PEN NEEDLES 31G X 5 MM MISC Inject 1 each into the skin 3 (three) times daily.     BD DISP NEEDLES 18G X 1-1/2 MISC      BD PLASTIPAK SYRINGE 21G X 1 3 ML MISC      BD PLASTIPAK SYRINGE 3 ML MISC      BD PRECISIONGLIDE NEEDLE 23G X 1-1/2 MISC      buPROPion  (WELLBUTRIN  XL) 150 MG 24 hr tablet Take 1 tablet (150 mg total) by mouth daily. 90 tablet 1   Continuous Glucose Receiver (FREESTYLE LIBRE 14 DAY READER) DEVI Use 1 Device as directed     Continuous Glucose Sensor (FREESTYLE LIBRE 3 SENSOR) MISC      cyanocobalamin  100 MCG tablet Take 100 mcg by mouth daily.  doxycycline  (VIBRAMYCIN ) 100 MG capsule Take 100 mg by mouth 2 (two) times daily.      empagliflozin (JARDIANCE) 25 MG TABS tablet Take 25 mg by mouth daily.     escitalopram  (LEXAPRO ) 20 MG tablet Take 1 tablet (20 mg total) by mouth daily. 90 tablet 1   gabapentin  (NEURONTIN ) 400 MG capsule Take 400 mg by mouth 3 (three) times daily.     HYDROcodone -acetaminophen  (NORCO/VICODIN) 5-325 MG tablet Take 1 tablet by mouth.     LANTUS  SOLOSTAR 100 UNIT/ML Solostar Pen Inject 6 Units into the skin at bedtime.     lisinopril  (ZESTRIL ) 5 MG tablet Take 5 mg by mouth daily.     metFORMIN (GLUCOPHAGE) 500 MG tablet Take 1,000 mg by mouth 2 (two) times daily with a meal.     methocarbamol  (ROBAXIN ) 500 MG tablet Take 1 tablet (500 mg total) by mouth every 6 (six) hours as needed for muscle spasms. 120 tablet 0   mupirocin ointment (BACTROBAN) 2 % Apply topically 3 (three) times daily.     pravastatin  (PRAVACHOL ) 80 MG tablet Take 80 mg by mouth daily.     pregabalin (LYRICA) 200 MG capsule Take 200 mg by mouth 2 (two) times daily.     QUEtiapine  (SEROQUEL ) 25 MG tablet Take 1 tablet (25 mg total) by mouth at bedtime. 90 tablet 0   rOPINIRole  (REQUIP ) 0.5 MG tablet Take 0.5 mg by mouth at bedtime.     SANTYL  250 UNIT/GM ointment APPLY 1 APPLICATION TOPICALLY DAILY (WOUND MEASUREMENT 0.3 CM X 0.3 CM X 0.3 CM) 30 g 11   Semaglutide 14 MG TABS Take 14 mg by mouth daily.     sildenafil (VIAGRA) 100 MG tablet Take 100 mg by mouth daily as needed for erectile dysfunction.     testosterone cypionate (DEPOTESTOSTERONE CYPIONATE) 200 MG/ML injection Inject into the muscle every 14 (fourteen) days.     triamcinolone ointment (KENALOG) 0.1 % Apply topically 2 (two) times daily.     No current facility-administered medications for this visit.     Musculoskeletal: Strength & Muscle Tone: decreased Gait & Station: unsteady, broad based Patient leans: N/A   Psychiatric Specialty Exam: Review of Systems  Constitutional: Negative.   HENT: Negative.    Eyes: Negative.   Respiratory: Negative.     Cardiovascular: Negative.   Gastrointestinal: Negative.   Endocrine: Negative.   Genitourinary: Negative.   Musculoskeletal: Negative.   Skin: Negative.   Allergic/Immunologic: Negative.   Neurological: Negative.   Hematological: Negative.   Psychiatric/Behavioral:  Positive for dysphoric mood.     Today's Vitals   07/23/24 1037  BP: 103/65  Pulse: (!) 8  Temp: (!) 97.4 F (36.3 C)  TempSrc: Temporal  Weight: 225 lb (102.1 kg)  Height: 6' 2 (1.88 m)   Body mass index is 28.89 kg/m.   General Appearance: Well Groomed  Eye Contact:  Good  Speech:  Clear and Coherent  Volume:  Normal  Mood:  Depressed  Affect:  Appropriate  Thought Process:  Coherent  Orientation:  Full (Time, Place, and Person)  Thought Content: Logical   Suicidal Thoughts:  No  Homicidal Thoughts:  No  Memory:  Immediate;   Good Recent;   Good Remote;   Good  Judgement:  Good  Insight:  Good  Psychomotor Activity:  Normal  Concentration:  Concentration: Good and Attention Span: Good  Recall:  Good  Fund of Knowledge: Good  Language: Good  Akathisia:  No  Handed:  Right  AIMS (if indicated): not done  Assets:  Desire for Improvement  ADL's:  Intact  Cognition: WNL  Sleep:  Good   Screenings: GAD-7    Flowsheet Row Office Visit from 05/14/2024 in [facility] Office Visit from 04/16/2024 in [facility] Office Visit from 03/11/2024 in [facility]  Total GAD-7 Score 7 14 14    PHQ2-9    Flowsheet Row Office Visit from 05/14/2024 in [facility] Office Visit from 04/16/2024 in [facility] Office Visit from 03/11/2024 in [facility] Office Visit from 02/08/2024 in [facility] Office Visit from 01/10/2024 in [facility]  PHQ-2 Total Score 2 6 6 6 4   PHQ-9 Total Score 9 24 25 24 14    Flowsheet Row Office Visit from 05/14/2024 in [facility] Office Visit from 04/16/2024 in [facility] Office Visit from 03/11/2024 in [facility]  C-SSRS RISK CATEGORY No Risk No Risk No Risk     Assessment and Plan:  Assessment - Diagnosis: Major depressive disorder, recurrent episode, moderate (HCC) [F33.1]  2. PTSD (post-traumatic stress disorder) [F43.10]  Differential Diagnosis: Depression related to medical condition Peripheral neuropathy  - Progress: PT reports no change in his symptoms, of depression reporting that 10mg  of Lexapro  is not improving his symptoms.   - Risk Factors: Risk for suicide identified due to major depressive disorder diagnosis.  Plan - Medications:  Continue Seroquel  25 mg once daily at night for sleep, prescribed by Glenda fields Continue taking Wellbutrin  150 mg once daily, prescribed by Glenda fields Continue Lexapro  to 20 mg once daily for depressive symptoms.  Patient has also educated to notify the provider of confusion or movements right away due to possible serotonin syndrome with the relationship between Seroquel  and Lexapro .  - Psychotherapy: Patient requesting to participate in therapy and symptom management with this provider. - Education: Patient educated on worsening symptoms of MDD as well as PTSD, patient was shared with resources of talk therapy and referral list in which he declined and states that he would prefer to work with a psychiatric provider and symptom management. - Follow-Up: Every 4 weeks for symptom management and therapy - Referrals: No referrals - Safety Planning: Patient currently denies SI, HI.  Patient agrees to safety plan of reaching out to the clinic should he have worsening symptoms of depression.   Patient also notified and instructed to call 911 or go to the closest hospital should the patient experience suicidal thoughts with or without a plan.  Patient endorses is having no firearms in the home.  Patient is in agreement with safety plan  Patient/Guardian was advised Release of Information must be obtained prior to any record release in order to collaborate their care with an outside provider. Patient/Guardian was advised if they have not already done so to contact the registration department to sign all necessary forms in order for us  to release information regarding their care.   Consent: Patient/Guardian gives verbal consent for treatment and assignment of benefits for services provided during this visit. Patient/Guardian expressed understanding and agreed to proceed.    Dorn Jama Der, NP 07/23/2024, 10:39 AM

## 2024-08-08 ENCOUNTER — Ambulatory Visit (INDEPENDENT_AMBULATORY_CARE_PROVIDER_SITE_OTHER): Admitting: Podiatry

## 2024-08-08 DIAGNOSIS — L97512 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ed: Secondary | ICD-10-CM | POA: Diagnosis not present

## 2024-08-08 NOTE — Progress Notes (Signed)
 Subjective:  Patient ID: [name], male    DOB: [birth year],  MRN: [id number]  Chief Complaint  Patient presents with   Foot Ulcer    Right foot ulcer follow up     68 y.o. male presents for wound care.  Patient presents with right submetatarsal 1 ulcer.  He states he is doing okay.  The wound may have gotten a little bit bigger denies any other acute complaints  Review of Systems: Negative except as noted in the HPI. Denies N/V/F/Ch.  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)    Depression    Diabetes mellitus without complication (HCC)    Diabetic infection of left foot (HCC)    Dysarthria    Gout    Hemiparesis affecting left side as late effect of cerebrovascular accident (CVA) (HCC)    HLD (hyperlipidemia)    Hyponatremia    Lactic acidosis    Left hallux osteomyelitis (HCC)    Osteomyelitis of ankle or foot, acute, left (HCC)    Stage 3a chronic kidney disease (HCC)    Stroke (HCC)    Type II diabetes mellitus with renal manifestations (HCC)     Current Outpatient Medications:    allopurinol  (ZYLOPRIM ) 300 MG tablet, Take 300 mg by mouth daily., Disp: , Rfl:    ammonium lactate (LAC-HYDRIN) 12 % lotion, Apply topically., Disp: , Rfl:    atorvastatin  (LIPITOR) 80 MG tablet, Take 1 tablet (80 mg total) by mouth daily., Disp: 90 tablet, Rfl: 0   B-D UF III MINI PEN NEEDLES 31G X 5 MM MISC, Inject 1 each into the skin 3 (three) times daily., Disp: , Rfl:    BD DISP NEEDLES 18G X 1-1/2 MISC, , Disp: , Rfl:    BD PLASTIPAK SYRINGE 21G X 1 3 ML MISC, , Disp: , Rfl:    BD PLASTIPAK SYRINGE 3 ML MISC, , Disp: , Rfl:    BD PRECISIONGLIDE NEEDLE 23G X 1-1/2 MISC, , Disp: , Rfl:    buPROPion  (WELLBUTRIN  XL) 150 MG 24 hr tablet, Take 1 tablet (150  mg total) by mouth daily., Disp: 90 tablet, Rfl: 1   Continuous Glucose Receiver (FREESTYLE LIBRE 14 DAY READER) DEVI, Use 1 Device as directed, Disp: , Rfl:    Continuous Glucose Sensor (FREESTYLE LIBRE 3 SENSOR) MISC, , Disp: , Rfl:    cyanocobalamin  100 MCG tablet, Take 100 mcg by mouth daily., Disp: , Rfl:    doxycycline  (VIBRAMYCIN ) 100 MG capsule, Take 100 mg by mouth 2 (two) times daily., Disp: , Rfl:    empagliflozin (JARDIANCE) 25 MG TABS tablet, Take 25 mg by mouth daily., Disp: , Rfl:    escitalopram  (LEXAPRO ) 20 MG tablet, Take 1 tablet (20 mg total) by mouth daily., Disp: 90 tablet, Rfl: 1   gabapentin  (NEURONTIN ) 400 MG capsule, Take 400 mg by mouth 3 (three) times daily., Disp: , Rfl:    HYDROcodone -acetaminophen  (NORCO/VICODIN) 5-325 MG tablet, Take 1 tablet by mouth., Disp: , Rfl:    LANTUS  SOLOSTAR 100 UNIT/ML Solostar Pen, Inject 6 Units into the skin at bedtime., Disp: , Rfl:    lisinopril  (ZESTRIL ) 5 MG tablet, Take 5 mg by mouth daily., Disp: , Rfl:    metFORMIN (GLUCOPHAGE) 500 MG  tablet, Take 1,000 mg by mouth 2 (two) times daily with a meal., Disp: , Rfl:    methocarbamol  (ROBAXIN ) 500 MG tablet, Take 1 tablet (500 mg total) by mouth every 6 (six) hours as needed for muscle spasms., Disp: 120 tablet, Rfl: 0   mupirocin ointment (BACTROBAN) 2 %, Apply topically 3 (three) times daily., Disp: , Rfl:    pravastatin  (PRAVACHOL ) 80 MG tablet, Take 80 mg by mouth daily., Disp: , Rfl:    pregabalin (LYRICA) 200 MG capsule, Take 200 mg by mouth 2 (two) times daily., Disp: , Rfl:    QUEtiapine  (SEROQUEL ) 25 MG tablet, Take 1 tablet (25 mg total) by mouth at bedtime., Disp: 90 tablet, Rfl: 0   rOPINIRole  (REQUIP ) 0.5 MG tablet, Take 0.5 mg by mouth at bedtime., Disp: , Rfl:    SANTYL  250 UNIT/GM ointment, APPLY 1 APPLICATION TOPICALLY DAILY (WOUND MEASUREMENT 0.3 CM X 0.3 CM X 0.3 CM), Disp: 30 g, Rfl: 11   Semaglutide 14 MG TABS, Take 14 mg by mouth daily., Disp: , Rfl:     sildenafil (VIAGRA) 100 MG tablet, Take 100 mg by mouth daily as needed for erectile dysfunction., Disp: , Rfl:    testosterone cypionate (DEPOTESTOSTERONE CYPIONATE) 200 MG/ML injection, Inject into the muscle every 14 (fourteen) days., Disp: , Rfl:    triamcinolone ointment (KENALOG) 0.1 %, Apply topically 2 (two) times daily., Disp: , Rfl:   Social History   Tobacco Use  Smoking Status Former   Current packs/day: 0.00   Types: Cigarettes   Quit date: 10/07/1997   Years since quitting: 26.8  Smokeless Tobacco Never    No Known Allergies Objective:  There were no vitals filed for this visit. There is no height or weight on file to calculate BMI. Constitutional Well developed. Well nourished.  Vascular Dorsalis pedis pulses palpable bilaterally. Posterior tibial pulses palpable bilaterally. Capillary refill normal to all digits.  No cyanosis or clubbing noted. Pedal hair growth normal.  Neurologic Normal speech. Oriented to person, place, and time. Protective sensation absent  Dermatologic Wound Location: Right submetatarsal 1 ulcer fat layer exposed Wound Base: Mixed Granular/Fibrotic Peri-wound: Calloused Exudate: Scant/small amount Serosanguinous exudate Wound Measurements: - See above  Orthopedic: No pain to palpation either foot.   Radiographs: None Assessment:   1. Right foot ulcer, with fat layer exposed (HCC)     Plan:  Patient was evaluated and treated and all questions answered.  Ulcer right submet 1 ulcer fat layer exposed -Debridement as below. -Dressed with Betadine wet-to-dry dressing, DSD. -Continue off-loading with surgical shoe. - We will hold off of Santyl  for now. - Patient has failed  graft application and will therefore hold off on any further grafts.   Procedure: Excisional Debridement of Wound~stagnant Tool: Sharp chisel blade/tissue nipper Rationale: Removal of non-viable soft tissue from the wound to promote healing.  Anesthesia:  none Pre-Debridement Wound Measurements: 1 cm x 1 cm x 1 cm Post-Debridement Wound Measurements: 1.2 cm x 1.2 cm x 0.3 cm Type of Debridement: Sharp Excisional Tissue Removed: Non-viable soft tissue Blood loss: Minimal (<50cc) Depth of Debridement: subcutaneous tissue. Technique: Sharp excisional debridement to bleeding, viable wound base.  Wound Progress: Is more granular but about the same Dressing: Dry, sterile, compression dressing. Disposition: Patient tolerated procedure well. Patient to return in 1 week for follow-up.  No follow-ups on file.

## 2024-08-20 ENCOUNTER — Ambulatory Visit (INDEPENDENT_AMBULATORY_CARE_PROVIDER_SITE_OTHER): Admitting: Psychiatry

## 2024-08-20 VITALS — BP 118/74 | HR 66 | Ht 74.0 in | Wt 227.0 lb

## 2024-08-20 DIAGNOSIS — F411 Generalized anxiety disorder: Secondary | ICD-10-CM

## 2024-08-20 DIAGNOSIS — F4310 Post-traumatic stress disorder, unspecified: Secondary | ICD-10-CM

## 2024-08-20 DIAGNOSIS — F331 Major depressive disorder, recurrent, moderate: Secondary | ICD-10-CM | POA: Diagnosis not present

## 2024-08-20 NOTE — Progress Notes (Unsigned)
 [facility] MD/PA/NP OP Progress Note  08/20/2024 11:35 AM [name]  MRN:  [id number]  Chief Complaint: Routine Follow-up HPI: 68 year old male presenting [facility] for follow-up.  Patient presents today for follow-up on medication management as well as psychotherapy.  Patient reports that he is doing well stating that he actually [place] the [place] fair within the last 2 weeks in which this is a good sign as patient has not gone out to enjoy or find leisure in his day-to-day.  Patient reports he had a fantastic time going to the state fair stating that he enjoyed looking at the tractors as well as the other crafts that were going on during the fair.  Patient reports now that he is finding that he is able to get around and stay safe without falls now that he has a scooter.  Patient reports he is getting much better and mobility with his scooter stating that he feels confident that he could possibly take long trips as well stating that he loves looking at planes and is thinking about going to the [place]  [employer] up in [place] .  Patient reports that he is currently satisfied with current medication regimen and is still interested in TMS in which TMS will be reached out to regarding this patient.  Patient with no other questions or concerns at this time.  Patient with no medication adjustments at this time.  See plan.  Patient is in agreement with treatment plan patient denies SI, HI, AVH.  Patient to follow-up in 2 weeks.  Patient has been referred to TMS therapy. Visit Diagnosis:    ICD-10-CM   1. Major depressive disorder, recurrent episode, moderate (HCC)  F33.1     2. Generalized anxiety disorder  F41.1     3. PTSD (post-traumatic stress disorder)  F43.10       Past Psychiatric History:  Previous Psych Hospitalizations:  -2023, Previously hospitalized for 4 weeks, with ECT Therapy.    Previous Dx: MDD, PTSD.   Medications Current: -Seroquel  25 mg once daily before bed. -Wellbutrin  150 mg  once daily -Lexapro  20mg  once daily   Medication Trials:  - Haldol, over sedating, stopped by patient, in the [employer] - Zoloft - poor response, in the [employer] - Prozac - poor response, in the [employer] - Zyprexa - disturbed thinking, stopped by patient, in the [employer] - Completed a course of TMS in 1999.    Suicide & Violence: Denies SI, HI.  Denies having a firearm within the home   Psychotherapy: Denies and states he is not interested in in therapy.   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)    Depression    Diabetes mellitus without complication (HCC)    Diabetic infection of left foot (HCC)    Dysarthria    Gout    Hemiparesis affecting left side as late effect of cerebrovascular accident (CVA) (HCC)    HLD (hyperlipidemia)    Hyponatremia    Lactic acidosis    Left hallux osteomyelitis (HCC)    Osteomyelitis of ankle or foot, acute, left (HCC)    Stage 3a chronic kidney disease (HCC)    Stroke (HCC)    Type II diabetes mellitus with  renal manifestations [facility])     Past Surgical History:  Procedure Laterality Date   ACHILLES TENDON SURGERY Left 08/18/2022   Procedure: ACHILLES LENGTHENING/KIDNER;  Surgeon: Tobie Franky SQUIBB, DPM;  Location: [facility] ORS;  Service: Podiatry;  Laterality: Left;   AMPUTATION TOE Left 12/16/2021   Procedure: AMPUTATION TOE - Left Great;  Surgeon: Neill Boas, DPM;  Location: [facility] ORS;  Service: Podiatry;  Laterality: Left;   AMPUTATION TOE Left 01/14/2022   Procedure: GREAT TOE AMPUTATION;  Surgeon: Neill Boas, DPM;  Location: [facility] ORS;  Service: Podiatry;  Laterality: Left;   APPENDECTOMY  1966   CATARACT EXTRACTION W/PHACO Left 05/24/2022   Procedure: CATARACT EXTRACTION  PHACO AND INTRAOCULAR LENS PLACEMENT (IOC) LEFT 7.63 00:42.1;  Surgeon: Jaye Fallow, MD;  Location: [facility];  Service: Ophthalmology;  Laterality: Left;  Diabetic   CATARACT EXTRACTION W/PHACO Right 06/07/2022   Procedure: CATARACT EXTRACTION PHACO AND INTRAOCULAR LENS PLACEMENT (IOC) RIGHT;  Surgeon: Jaye Fallow, MD;  Location: [facility];  Service: Ophthalmology;  Laterality: Right;  7.33 0:45.3   COLONOSCOPY     LAPAROSCOPIC GASTRIC BANDING     POSTERIOR CERVICAL LAMINECTOMY Right 06/05/2023   Procedure: RIGHT MINIMALLY INVASIVE (MIS) C7-T1 DISCECTOMY;  Surgeon: Claudene Penne Lin, MD;  Location: [facility] OR[facility];  Service: Neurosurgery;  Laterality: Right;   TRANSMETATARSAL AMPUTATION Left 08/18/2022   Procedure: TRANSMETATARSAL AMPUTATION;  Surgeon: Tobie Franky SQUIBB, DPM;  Location: [facility];  Service: Podiatry;  Laterality: Left;   WISDOM TOOTH EXTRACTION      Family Psychiatric History: No additional  Family History:  Family History  Problem Relation Age of Onset   Paranoid behavior Sister     Social History:  Social History   Socioeconomic History   Marital status: Single    Spouse name: Not on file   Number of children: 1   Years of education: 14   Highest education level: Associate degree: occupational, Scientist, product/process development, or vocational program  Occupational History   Not on file  Tobacco Use   Smoking status: Former    Current packs/day: 0.00    Types: Cigarettes    Quit date: 10/07/1997    Years since quitting: 26.8   Smokeless tobacco: Never  Vaping Use   Vaping status: Never Used  Substance and Sexual Activity   Alcohol use: Never   Drug use: Never   Sexual activity: Not Currently  Other Topics Concern   Not on file  Social History Narrative   LIVES ALONE  in [place]     Social Drivers of Health   Financial Resource Strain: Low Risk  (07/14/2024)   Received from [facility]   Overall Financial Resource Strain  (CARDIA)    Difficulty of Paying Living Expenses: Not hard at all  Food Insecurity: No Food Insecurity (07/14/2024)   Received from [facility]   Hunger Vital Sign    Within the past 12 months, you worried that your food would run out before you got the money to buy more.: Never true    Within the past 12 months, the food you bought just didn't last and you didn't have money to get more.: Never true  Transportation Needs: No Transportation Needs (07/14/2024)   Received from [facility] - Transportation    In the past 12 months, has lack of transportation kept you from medical appointments or from getting medications?: No    Lack of Transportation (Non-Medical): No  Physical Activity: Inactive (  11/21/2023)   Received from [facility]   Exercise Vital Sign    On average, how many days per week do you engage in moderate to strenuous exercise (like a brisk walk)?: 0 days    On average, how many minutes do you engage in exercise at this level?: 0 min  Stress: No Stress Concern Present (11/21/2023)   Received from [facility] - Occupational Stress Questionnaire    Feeling of Stress : Only a little  Social Connections: Socially Isolated (11/21/2023)   Received from [facility]   Social Connection and Isolation Panel    In a typical week, how many times do you talk on the phone with family, friends, or neighbors?: Never    How often do you get together with friends or relatives?: Once a week    How often do you attend church or religious services?: Never    Do you belong to any clubs or organizations such as church groups, unions, fraternal or athletic groups, or school groups?: No    How often do you attend meetings of the clubs or organizations you belong to?: Never    Are you married, widowed, divorced, separated, never married, or living with a partner?:  Divorced    Allergies: No Known Allergies  Metabolic Disorder Labs: Lab Results  Component Value Date   HGBA1C 7.0 (H) 08/19/2022   MPG 154.2 08/19/2022   MPG 139.85 12/16/2021   No results found for: PROLACTIN Lab Results  Component Value Date   CHOL 130 12/06/2021   TRIG 84 12/06/2021   HDL 27 (L) 12/06/2021   CHOLHDL 4.8 12/06/2021   VLDL 17 12/06/2021   LDLCALC 86 12/06/2021   No results found for: TSH  Therapeutic Level Labs: No results found for: LITHIUM No results found for: VALPROATE No results found for: CBMZ  Current Medications: Current Outpatient Medications  Medication Sig Dispense Refill   allopurinol  (ZYLOPRIM ) 300 MG tablet Take 300 mg by mouth daily.     atorvastatin  (LIPITOR) 80 MG tablet Take 1 tablet (80 mg total) by mouth daily. 90 tablet 0   B-D UF III MINI PEN NEEDLES 31G X 5 MM MISC Inject 1 each into the skin 3 (three) times daily.     BD DISP NEEDLES 18G X 1-1/2 MISC      BD PLASTIPAK SYRINGE 21G X 1 3 ML MISC      BD PLASTIPAK SYRINGE 3 ML MISC      BD PRECISIONGLIDE NEEDLE 23G X 1-1/2 MISC      buPROPion  (WELLBUTRIN  XL) 150 MG 24 hr tablet Take 1 tablet (150 mg total) by mouth daily. 90 tablet 1   Continuous Glucose Receiver (FREESTYLE LIBRE 14 DAY READER) DEVI Use 1 Device as directed     Continuous Glucose Sensor (FREESTYLE LIBRE 3 SENSOR) MISC      cyanocobalamin  100 MCG tablet Take 100 mcg by mouth daily.     doxycycline  (VIBRAMYCIN ) 100 MG capsule Take 100 mg by mouth 2 (two) times daily.     empagliflozin (JARDIANCE) 25 MG TABS tablet Take 25 mg by mouth daily.     escitalopram  (LEXAPRO ) 20 MG tablet Take 1 tablet (20 mg total) by mouth daily. 90 tablet 1   gabapentin  (NEURONTIN ) 400 MG capsule Take 400 mg by mouth 3 (three) times daily.     LANTUS  SOLOSTAR 100 UNIT/ML Solostar Pen Inject 6 Units into the skin at bedtime.  lisinopril  (ZESTRIL ) 5 MG tablet Take 5 mg by mouth daily.     metFORMIN (GLUCOPHAGE) 500 MG  tablet Take 1,000 mg by mouth 2 (two) times daily with a meal.     methocarbamol  (ROBAXIN ) 500 MG tablet Take 1 tablet (500 mg total) by mouth every 6 (six) hours as needed for muscle spasms. 120 tablet 0   pravastatin  (PRAVACHOL ) 80 MG tablet Take 80 mg by mouth daily.     pregabalin (LYRICA) 200 MG capsule Take 200 mg by mouth 2 (two) times daily.     QUEtiapine  (SEROQUEL ) 25 MG tablet Take 1 tablet (25 mg total) by mouth at bedtime. 90 tablet 0   rOPINIRole  (REQUIP ) 0.5 MG tablet Take 0.5 mg by mouth at bedtime.     Semaglutide 14 MG TABS Take 14 mg by mouth daily.     testosterone cypionate (DEPOTESTOSTERONE CYPIONATE) 200 MG/ML injection Inject into the muscle every 14 (fourteen) days.     ammonium lactate (LAC-HYDRIN) 12 % lotion Apply topically.     No current facility-administered medications for this visit.    Musculoskeletal: Strength & Muscle Tone: decreased Gait & Station: unsteady, broad based Patient leans: N/A   Psychiatric Specialty Exam: Review of Systems  Constitutional: Negative.   HENT: Negative.    Eyes: Negative.   Respiratory: Negative.    Cardiovascular: Negative.   Gastrointestinal: Negative.   Endocrine: Negative.   Genitourinary: Negative.   Musculoskeletal: Negative.   Skin: Negative.   Allergic/Immunologic: Negative.   Neurological: Negative.   Hematological: Negative.   Psychiatric/Behavioral:  Positive for dysphoric mood.      Blood pressure 118/74, pulse 66, height 6' 2 (1.88 m), weight 227 lb (103 kg), SpO2 100%.Body mass index is 29.15 kg/m.  General Appearance: Well Groomed  Eye Contact:  Good  Speech:  Clear and Coherent  Volume:  Normal  Mood:  Depressed  Affect:  Appropriate  Thought Process:  Coherent  Orientation:  Full (Time, Place, and Person)  Thought Content: Logical   Suicidal Thoughts:  No  Homicidal Thoughts:  No  Memory:  Immediate;   Good Recent;   Good Remote;   Good  Judgement:  Good  Insight:  Good  Psychomotor  Activity:  Normal  Concentration:  Concentration: Good and Attention Span: Good  Recall:  Good  Fund of Knowledge: Good  Language: Good  Akathisia:  No  Handed:  Right  AIMS (if indicated): not done  Assets:  Desire for Improvement  ADL's:  Intact  Cognition: WNL  Sleep:  Good   Screenings: GAD-7    Flowsheet Row Office Visit from 05/14/2024 in [facility] Office Visit from 04/16/2024 in [facility] Office Visit from 03/11/2024 in [facility]  Total GAD-7 Score 7 14 14    PHQ2-9    Flowsheet Row Office Visit from 05/14/2024 in [facility] Office Visit from 04/16/2024 in [facility] Office Visit from 03/11/2024 in [facility] Office Visit from 02/08/2024 in [facility] Office Visit from 01/10/2024 in [facility]  PHQ-2 Total Score 2 6 6 6 4   PHQ-9 Total Score 9 24 25 24 14    Flowsheet Row Office Visit from 05/14/2024 in [facility] Office Visit from 04/16/2024 in [facility] Office Visit from 03/11/2024 in [facility]  C-SSRS RISK CATEGORY No Risk No Risk No Risk     Assessment and Plan:  Assessment - Diagnosis: Major depressive disorder, recurrent episode, moderate (HCC) [F33.1]  2. PTSD (post-traumatic stress disorder) [F43.10]  Differential Diagnosis: Depression related to medical condition Peripheral neuropathy  - Progress: PT reports no change in his symptoms, of depression reporting that 10mg  of Lexapro  is not improving his symptoms.   - Risk Factors: Risk for suicide identified due to major depressive disorder diagnosis.  Plan -  Medications:  Continue Seroquel  25 mg once daily at night for sleep, prescribed by Glenda fields Continue taking Wellbutrin  150 mg once daily, prescribed by Glenda fields Continue Lexapro  to 20 mg once daily for depressive symptoms.  Patient has also educated to notify the provider of confusion or movements right away due to possible serotonin syndrome with the relationship between Seroquel  and Lexapro .  - Psychotherapy: Patient requesting to participate in therapy and symptom management with this provider. - Education: Patient educated on worsening symptoms of MDD as well as PTSD, patient was shared with resources of talk therapy and referral list in which he declined and states that he would prefer to work with a psychiatric provider and symptom management. - Follow-Up: Every 4 weeks for symptom management and therapy - Referrals: No referrals - Safety Planning: Patient currently denies SI, HI.  Patient agrees to safety plan of reaching out to the clinic should he have worsening symptoms of depression.  Patient also notified and instructed to call 911 or go to the closest hospital should the patient experience suicidal thoughts with or without a plan.  Patient endorses is having no firearms in the home.  Patient is in agreement with safety plan  Patient/Guardian was advised Release of Information must be obtained prior to any record release in order to collaborate their care with an outside provider. Patient/Guardian was advised if they have not already done so to contact the registration department to sign all necessary forms in order for us  to release information regarding their care.   Consent: Patient/Guardian gives verbal consent for treatment and assignment of benefits for services provided during this visit. Patient/Guardian expressed understanding and agreed to proceed.    Dorn Jama Der, NP 08/20/2024, 11:35 AM

## 2024-08-22 ENCOUNTER — Ambulatory Visit (INDEPENDENT_AMBULATORY_CARE_PROVIDER_SITE_OTHER): Admitting: Podiatry

## 2024-08-22 DIAGNOSIS — L97512 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ed: Secondary | ICD-10-CM | POA: Diagnosis not present

## 2024-08-22 NOTE — Progress Notes (Signed)
 Subjective:  Patient ID: [name], male    DOB: [birth year],  MRN: [id number]  Chief Complaint  Patient presents with   Foot Ulcer    68 y.o. male presents for wound care.  Patient presents with right submetatarsal 1 ulcer.  He states he is doing okay.  The wound may have gotten a little bit bigger denies any other acute complaints he states he got another wound on the fifth digit.  Wanted get it evaluated denies any other acute complaints  Review of Systems: Negative except as noted in the HPI. Denies N/V/F/Ch.  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)    Depression    Diabetes mellitus without complication (HCC)    Diabetic infection of left foot (HCC)    Dysarthria    Gout    Hemiparesis affecting left side as late effect of cerebrovascular accident (CVA) (HCC)    HLD (hyperlipidemia)    Hyponatremia    Lactic acidosis    Left hallux osteomyelitis (HCC)    Osteomyelitis of ankle or foot, acute, left (HCC)    Stage 3a chronic kidney disease (HCC)    Stroke (HCC)    Type II diabetes mellitus with renal manifestations (HCC)     Current Outpatient Medications:    allopurinol  (ZYLOPRIM ) 300 MG tablet, Take 300 mg by mouth daily., Disp: , Rfl:    ammonium lactate (LAC-HYDRIN) 12 % lotion, Apply topically., Disp: , Rfl:    atorvastatin  (LIPITOR) 80 MG tablet, Take 1 tablet (80 mg total) by mouth daily., Disp: 90 tablet, Rfl: 0   B-D UF III MINI PEN NEEDLES 31G X 5 MM MISC, Inject 1 each into the skin 3 (three) times daily., Disp: , Rfl:    BD DISP NEEDLES 18G X 1-1/2 MISC, , Disp: , Rfl:    BD PLASTIPAK SYRINGE 21G X 1 3 ML MISC, , Disp: , Rfl:    BD PLASTIPAK SYRINGE 3 ML MISC, , Disp: , Rfl:    BD PRECISIONGLIDE NEEDLE 23G X 1-1/2 MISC, , Disp:  , Rfl:    buPROPion  (WELLBUTRIN  XL) 150 MG 24 hr tablet, Take 1 tablet (150 mg total) by mouth daily., Disp: 90 tablet, Rfl: 1   Continuous Glucose Receiver (FREESTYLE LIBRE 14 DAY READER) DEVI, Use 1 Device as directed, Disp: , Rfl:    Continuous Glucose Sensor (FREESTYLE LIBRE 3 SENSOR) MISC, , Disp: , Rfl:    cyanocobalamin  100 MCG tablet, Take 100 mcg by mouth daily., Disp: , Rfl:    doxycycline  (VIBRAMYCIN ) 100 MG capsule, Take 100 mg by mouth 2 (two) times daily., Disp: , Rfl:    empagliflozin (JARDIANCE) 25 MG TABS tablet, Take 25 mg by mouth daily., Disp: , Rfl:    escitalopram  (LEXAPRO ) 20 MG tablet, Take 1 tablet (20 mg total) by mouth daily., Disp: 90 tablet, Rfl: 1   gabapentin  (NEURONTIN ) 400 MG capsule, Take 400 mg by mouth 3 (three) times daily., Disp: , Rfl:    LANTUS  SOLOSTAR 100 UNIT/ML Solostar Pen, Inject 6 Units into the skin at bedtime., Disp: , Rfl:    lisinopril  (ZESTRIL ) 5 MG tablet, Take 5 mg by mouth daily., Disp: , Rfl:    metFORMIN (GLUCOPHAGE) 500 MG tablet, Take 1,000 mg by  mouth 2 (two) times daily with a meal., Disp: , Rfl:    methocarbamol  (ROBAXIN ) 500 MG tablet, Take 1 tablet (500 mg total) by mouth every 6 (six) hours as needed for muscle spasms., Disp: 120 tablet, Rfl: 0   pravastatin  (PRAVACHOL ) 80 MG tablet, Take 80 mg by mouth daily., Disp: , Rfl:    pregabalin (LYRICA) 200 MG capsule, Take 200 mg by mouth 2 (two) times daily., Disp: , Rfl:    QUEtiapine  (SEROQUEL ) 25 MG tablet, Take 1 tablet (25 mg total) by mouth at bedtime., Disp: 90 tablet, Rfl: 0   rOPINIRole  (REQUIP ) 0.5 MG tablet, Take 0.5 mg by mouth at bedtime., Disp: , Rfl:    Semaglutide 14 MG TABS, Take 14 mg by mouth daily., Disp: , Rfl:    testosterone cypionate (DEPOTESTOSTERONE CYPIONATE) 200 MG/ML injection, Inject into the muscle every 14 (fourteen) days., Disp: , Rfl:   Social History   Tobacco Use  Smoking Status Former   Current packs/day: 0.00   Types: Cigarettes   Quit date:  10/07/1997   Years since quitting: 26.8  Smokeless Tobacco Never    No Known Allergies Objective:  There were no vitals filed for this visit. There is no height or weight on file to calculate BMI. Constitutional Well developed. Well nourished.  Vascular Dorsalis pedis pulses palpable bilaterally. Posterior tibial pulses palpable bilaterally. Capillary refill normal to all digits.  No cyanosis or clubbing noted. Pedal hair growth normal.  Neurologic Normal speech. Oriented to person, place, and time. Protective sensation absent  Dermatologic Wound Location: Right submetatarsal 1 ulcer fat layer exposed Wound Base: Mixed Granular/Fibrotic Peri-wound: Calloused Exudate: Scant/small amount Serosanguinous exudate Wound Measurements: - See above  Orthopedic: No pain to palpation either foot.   Radiographs: None Assessment:   1. Right foot ulcer, with fat layer exposed (HCC)      Plan:  Patient was evaluated and treated and all questions answered.  Ulcer right submet 1 ulcer and right fifth digit fat layer exposed -Debridement as below. -Dressed with Betadine wet-to-dry dressing, DSD. -Continue off-loading with surgical shoe. - We will hold off of Santyl  for now. - Patient has failed  graft application and will therefore hold off on any further grafts.   Procedure: Excisional Debridement of Wound~stagnant Tool: Sharp chisel blade/tissue nipper Rationale: Removal of non-viable soft tissue from the wound to promote healing.  Anesthesia: none Pre-Debridement Wound Measurements: 1 cm x 1 cm x 1 cm Post-Debridement Wound Measurements: 1.2 cm x 1.2 cm x 0.3 cm Type of Debridement: Sharp Excisional Tissue Removed: Non-viable soft tissue Blood loss: Minimal (<50cc) Depth of Debridement: subcutaneous tissue. Technique: Sharp excisional debridement to bleeding, viable wound base.  Wound Progress: Is more granular but about the same Dressing: Dry, sterile, compression  dressing. Disposition: Patient tolerated procedure well. Patient to return in 1 week for follow-up.  Procedure: Excisional Debridement of Wound~stagnant Tool: Sharp chisel blade/tissue nipper Rationale: Removal of non-viable soft tissue from the wound to promote healing.  Anesthesia: none Pre-Debridement Wound Measurements: 0.3 cm x 0.3 cm x 0.3 cm Post-Debridement Wound Measurements: 0.4 cm x 0.4 cm x 0.3 cm Type of Debridement: Sharp Excisional Tissue Removed: Non-viable soft tissue Blood loss: Minimal (<50cc) Depth of Debridement: subcutaneous tissue. Technique: Sharp excisional debridement to bleeding, viable wound base.  Wound Progress: This is a initial evaluation open to monitor the progression of the wound Dressing: Dry, sterile, compression dressing. Disposition: Patient tolerated procedure well. Patient to return in 1 week for follow-up.  No follow-ups on file.

## 2024-09-17 ENCOUNTER — Ambulatory Visit (INDEPENDENT_AMBULATORY_CARE_PROVIDER_SITE_OTHER): Admitting: Psychiatry

## 2024-09-17 ENCOUNTER — Encounter: Payer: Self-pay | Admitting: Psychiatry

## 2024-09-17 VITALS — BP 96/80 | HR 76 | Temp 97.8°F | Wt 226.0 lb

## 2024-09-17 DIAGNOSIS — F329 Major depressive disorder, single episode, unspecified: Secondary | ICD-10-CM

## 2024-09-17 DIAGNOSIS — F331 Major depressive disorder, recurrent, moderate: Secondary | ICD-10-CM | POA: Diagnosis not present

## 2024-09-17 DIAGNOSIS — F514 Sleep terrors [night terrors]: Secondary | ICD-10-CM | POA: Diagnosis not present

## 2024-09-17 DIAGNOSIS — F4310 Post-traumatic stress disorder, unspecified: Secondary | ICD-10-CM | POA: Diagnosis not present

## 2024-09-17 DIAGNOSIS — F411 Generalized anxiety disorder: Secondary | ICD-10-CM

## 2024-09-17 NOTE — Progress Notes (Signed)
 [facility] MD/PA/NP OP Progress Note  09/17/2024 11:04 AM [name]  MRN:  [id number]  Chief Complaint: Routine Follow-up HPI: 68 year old male presenting [facility] for follow-up.  Patient reports he is doing well stating that he just came back from a trip from the beach in which he was able to go on the pier and enjoyed the scenery.  Patient reports he also stayed the night with a friend in which he states is platonic but reports that he often gets lunch and spends time with her.  For the holidays patient states that he does not have any plans as no one has invited him yet but reports that he will try to spend time with friends or family at least a part of the day of the holidays.  Patient currently reports he is having stressors in regards to his sister who got her license taken away recently and is being placed in assisted living.  Patient reports that he understands that it is hard to lose independence but understands the public safety as well as fragility of the situation in which she is often set experiencing psychosis and causing issues especially safety risk.  Patient with no other question concerns at this time.  Patient is in agreement with treatment plan.  Patient is to follow-up in 2 weeks.  Patient denies SI, HI, AVH. Visit Diagnosis:    ICD-10-CM   1. Major depressive disorder, recurrent episode, moderate (HCC)  F33.1     2. Generalized anxiety disorder  F41.1     3. PTSD (post-traumatic stress disorder)  F43.10     4. 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 Night terrors, adult  F51.4       Past Psychiatric History:  Previous Psych Hospitalizations:  -2023, Previously hospitalized for 4 weeks, with ECT Therapy.    Previous Dx: MDD, PTSD.   Medications Current: -Seroquel  25 mg once daily before bed. -Wellbutrin  150 mg once daily -Lexapro  20mg  once daily   Medication Trials:  - Haldol, over sedating, stopped by patient, in the [employer] - Zoloft - poor response, in the  [employer] - Prozac - poor response, in the [employer] - Zyprexa - disturbed thinking, stopped by patient, in the [employer] - Completed a course of TMS in 1999.    Suicide & Violence: Denies SI, HI.  Denies having a firearm within the home   Psychotherapy: Denies and states he is not interested in in therapy.   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)    Depression    Diabetes mellitus without complication (HCC)    Diabetic infection of left foot (HCC)    Dysarthria    Gout    Hemiparesis affecting left side as late effect of cerebrovascular accident (CVA) (HCC)    HLD (hyperlipidemia)    Hyponatremia    Lactic acidosis    Left hallux osteomyelitis (HCC)    Osteomyelitis of ankle or foot, acute, left (HCC)    Stage 3a chronic kidney disease (HCC)    Stroke (HCC)    Type II diabetes mellitus with renal manifestations (HCC)     Past Surgical History:  Procedure Laterality Date   ACHILLES TENDON SURGERY Left  08/18/2022   Procedure: ACHILLES LENGTHENING/KIDNER;  Surgeon: Tobie Franky SQUIBB, DPM;  Location: [facility];  Service: Podiatry;  Laterality: Left;   AMPUTATION TOE Left 12/16/2021   Procedure: AMPUTATION TOE - Left Great;  Surgeon: Neill Boas, DPM;  Location: [facility];  Service: Podiatry;  Laterality: Left;   AMPUTATION TOE Left 01/14/2022   Procedure: GREAT TOE AMPUTATION;  Surgeon: Neill Boas, DPM;  Location: [facility];  Service: Podiatry;  Laterality: Left;   APPENDECTOMY  1966   CATARACT EXTRACTION W/PHACO Left 05/24/2022   Procedure: CATARACT EXTRACTION PHACO AND INTRAOCULAR LENS PLACEMENT (IOC) LEFT 7.63 00:42.1;  Surgeon: Jaye Fallow, MD;  Location[facility];  Service: Ophthalmology;   Laterality: Left;  Diabetic   CATARACT EXTRACTION W/PHACO Right 06/07/2022   Procedure: CATARACT EXTRACTION PHACO AND INTRAOCULAR LENS PLACEMENT (IOC) RIGHT;  Surgeon: Jaye Fallow, MD;  Location: [facility];  Service: Ophthalmology;  Laterality: Right;  7.33 0:45.3   COLONOSCOPY     LAPAROSCOPIC GASTRIC BANDING     POSTERIOR CERVICAL LAMINECTOMY Right 06/05/2023   Procedure: RIGHT MINIMALLY INVASIVE (MIS) C7-T1 DISCECTOMY;  Surgeon: Claudene Penne Lin, MD;  Location: [facility];  Service: Neurosurgery;  Laterality: Right;   TRANSMETATARSAL AMPUTATION Left 08/18/2022   Procedure: TRANSMETATARSAL AMPUTATION;  Surgeon: Tobie Franky SQUIBB, DPM;  Location: [facility];  Service: Podiatry;  Laterality: Left;   WISDOM TOOTH EXTRACTION      Family Psychiatric History: No additional  Family History:  Family History  Problem Relation Age of Onset   Paranoid behavior Sister     Social History:  Social History   Socioeconomic History   Marital status: Single    Spouse name: Not on file   Number of children: 1   Years of education: 14   Highest education level: Associate degree: occupational, scientist, product/process development, or vocational program  Occupational History   Not on file  Tobacco Use   Smoking status: Former    Current packs/day: 0.00    Types: Cigarettes    Quit date: 10/07/1997    Years since quitting: 26.9   Smokeless tobacco: Never  Vaping Use   Vaping status: Never Used  Substance and Sexual Activity   Alcohol use: Never   Drug use: Never   Sexual activity: Not Currently  Other Topics Concern   Not on file  Social History Narrative   LIVES ALONE  in [place]     Social Drivers of Health   Financial Resource Strain: Low Risk  (07/14/2024)   [facility]   Overall Financial Resource Strain (CARDIA)    Difficulty of Paying Living Expenses: Not hard at all  Food Insecurity: No Food Insecurity (07/14/2024)   Received from [facility]   Hunger Vital Sign    Within the past 12 months, you worried that your food would run out before you got the money to buy more.: Never true    Within the past 12 months, the food you bought just didn't last and you didn't have money to get more.: Never true  Transportation Needs: No Transportation Needs (07/14/2024)   Received from [facility] - Transportation    In the past 12 months, has lack of transportation kept you from medical appointments or from getting medications?: No    Lack of Transportation (Non-Medical): No  Physical Activity: Inactive (11/21/2023)   Received from [facility]   Exercise Vital Sign    On average, how  many days per week do you engage in moderate to strenuous exercise (like a brisk walk)?: 0 days    On average, how many minutes do you engage in exercise at this level?: 0 min  Stress: No Stress Concern Present (11/21/2023)   Received from [facility] - Occupational Stress Questionnaire    Feeling of Stress : Only a little  Social Connections: Socially Isolated (11/21/2023)   Received from [facility]   Social Connection and Isolation Panel    In a typical week, how many times do you talk on the phone with family, friends, or neighbors?: Never    How often do you get together with friends or relatives?: Once a week    How often do you attend church or religious services?: Never    Do you belong to any clubs or organizations such as church groups, unions, fraternal or athletic groups, or school groups?: No    How often do you attend meetings of the clubs or organizations you belong to?: Never    Are you married, widowed, divorced, separated, never married, or living with a partner?: Divorced    Allergies: No Known Allergies  Metabolic Disorder Labs: Lab Results  Component Value Date   HGBA1C 7.0 (H) 08/19/2022   MPG 154.2  08/19/2022   MPG 139.85 12/16/2021   No results found for: PROLACTIN Lab Results  Component Value Date   CHOL 130 12/06/2021   TRIG 84 12/06/2021   HDL 27 (L) 12/06/2021   CHOLHDL 4.8 12/06/2021   VLDL 17 12/06/2021   LDLCALC 86 12/06/2021   No results found for: TSH  Therapeutic Level Labs: No results found for: LITHIUM No results found for: VALPROATE No results found for: CBMZ  Current Medications: Current Outpatient Medications  Medication Sig Dispense Refill   allopurinol  (ZYLOPRIM ) 300 MG tablet Take 300 mg by mouth daily.     atorvastatin  (LIPITOR) 80 MG tablet Take 1 tablet (80 mg total) by mouth daily. 90 tablet 0   B-D UF III MINI PEN NEEDLES 31G X 5 MM MISC Inject 1 each into the skin 3 (three) times daily.     BD DISP NEEDLES 18G X 1-1/2 MISC      BD PLASTIPAK SYRINGE 21G X 1 3 ML MISC      BD PLASTIPAK SYRINGE 3 ML MISC      BD PRECISIONGLIDE NEEDLE 23G X 1-1/2 MISC      buPROPion  (WELLBUTRIN  XL) 150 MG 24 hr tablet Take 1 tablet (150 mg total) by mouth daily. 90 tablet 1   Continuous Glucose Receiver (FREESTYLE LIBRE 14 DAY READER) DEVI Use 1 Device as directed     Continuous Glucose Sensor (FREESTYLE LIBRE 3 SENSOR) MISC      cyanocobalamin  100 MCG tablet Take 100 mcg by mouth daily.     doxycycline  (VIBRAMYCIN ) 100 MG capsule Take 100 mg by mouth 2 (two) times daily.     empagliflozin (JARDIANCE) 25 MG TABS tablet Take 25 mg by mouth daily.     escitalopram  (LEXAPRO ) 20 MG tablet Take 1 tablet (20 mg total) by mouth daily. 90 tablet 1   gabapentin  (NEURONTIN ) 400 MG capsule Take 400 mg by mouth 3 (three) times daily.     LANTUS  SOLOSTAR 100 UNIT/ML Solostar Pen Inject 6 Units into the skin at bedtime.     lisinopril  (ZESTRIL ) 5 MG tablet Take 5 mg by mouth daily.     metFORMIN (  GLUCOPHAGE) 500 MG tablet Take 1,000 mg by mouth 2 (two) times daily with a meal.     methocarbamol  (ROBAXIN ) 500 MG tablet Take 1 tablet (500 mg total) by mouth every 6  (six) hours as needed for muscle spasms. 120 tablet 0   pravastatin  (PRAVACHOL ) 80 MG tablet Take 80 mg by mouth daily.     pregabalin (LYRICA) 200 MG capsule Take 200 mg by mouth 2 (two) times daily.     QUEtiapine  (SEROQUEL ) 25 MG tablet Take 1 tablet (25 mg total) by mouth at bedtime. 90 tablet 0   rOPINIRole  (REQUIP ) 0.5 MG tablet Take 0.5 mg by mouth at bedtime.     Semaglutide 14 MG TABS Take 14 mg by mouth daily.     testosterone cypionate (DEPOTESTOSTERONE CYPIONATE) 200 MG/ML injection Inject into the muscle every 14 (fourteen) days.     ammonium lactate (LAC-HYDRIN) 12 % lotion Apply topically.     No current facility-administered medications for this visit.     Musculoskeletal: Strength & Muscle Tone: decreased Gait & Station: unsteady, broad based Patient leans: N/A   Psychiatric Specialty Exam: Review of Systems  Constitutional: Negative.   HENT: Negative.    Eyes: Negative.   Respiratory: Negative.    Cardiovascular: Negative.   Gastrointestinal: Negative.   Endocrine: Negative.   Genitourinary: Negative.   Musculoskeletal: Negative.   Skin: Negative.   Allergic/Immunologic: Negative.   Neurological: Negative.   Hematological: Negative.   Psychiatric/Behavioral:  Positive for dysphoric mood.      Today's Vitals   09/17/24 1050  BP: 96/80  Pulse: 76  Temp: 97.8 F (36.6 C)  TempSrc: Temporal  SpO2: 100%  Weight: 226 lb (102.5 kg)   Body mass index is 29.02 kg/m.   General Appearance: Well Groomed  Eye Contact:  Good  Speech:  Clear and Coherent  Volume:  Normal  Mood:  Depressed  Affect:  Appropriate  Thought Process:  Coherent  Orientation:  Full (Time, Place, and Person)  Thought Content: Logical   Suicidal Thoughts:  No  Homicidal Thoughts:  No  Memory:  Immediate;   Good Recent;   Good Remote;   Good  Judgement:  Good  Insight:  Good  Psychomotor Activity:  Normal  Concentration:  Concentration: Good and Attention Span: Good  Recall:   Good  Fund of Knowledge: Good  Language: Good  Akathisia:  No  Handed:  Right  AIMS (if indicated): not done  Assets:  Desire for Improvement  ADL's:  Intact  Cognition: WNL  Sleep:  Good   Screenings: GAD-7    Flowsheet Row Office Visit from 09/17/2024 in [facility] Office Visit from 05/14/2024 in [facility] Office Visit from 04/16/2024 in [facility] Office Visit from 03/11/2024 in [facility]  Total GAD-7 Score 1 7 14 14    PHQ2-9    Flowsheet Row Office Visit from 09/17/2024 in [facility] Office Visit from 05/14/2024 in [facility] Office Visit from 04/16/2024 in [facility] Office Visit from 03/11/2024 in [facility] Office Visit from 02/08/2024 in [facility]  PHQ-2 Total Score 3 2 6 6 6   PHQ-9 Total Score 10 9 24 25 24    Flowsheet Row Office Visit from 09/17/2024 in [facility] Office Visit from 05/14/2024 in  [facility] Office Visit from 04/16/2024 in [facility]  C-SSRS RISK CATEGORY Moderate Risk No Risk No Risk     Assessment and Plan:  Assessment - Diagnosis: Major depressive disorder, recurrent episode, moderate (HCC) [F33.1]  2. PTSD (post-traumatic stress disorder) [F43.10]  Differential Diagnosis: Depression related to medical condition Peripheral neuropathy  - Progress: PT reports no change in his symptoms, of depression reporting that 10mg  of Lexapro  is not improving his symptoms.   - Risk Factors: Risk for suicide identified due to major depressive disorder diagnosis.  Plan -  Medications:  Continue Seroquel  25 mg once daily at night for sleep, prescribed by Glenda fields Continue taking Wellbutrin  150 mg once daily, prescribed by Glenda fields Continue Lexapro  to 20 mg once daily for depressive symptoms.  Patient has also educated to notify the provider of confusion or movements right away due to possible serotonin syndrome with the relationship between Seroquel  and Lexapro .  - Psychotherapy: Patient requesting to participate in therapy and symptom management with this provider. - Education: Patient educated on worsening symptoms of MDD as well as PTSD, patient was shared with resources of talk therapy and referral list in which he declined and states that he would prefer to work with a psychiatric provider and symptom management. - Follow-Up: Every 4 weeks for symptom management and therapy - Referrals: No referrals - Safety Planning: Patient currently denies SI, HI.  Patient agrees to safety plan of reaching out to the clinic should he have worsening symptoms of depression.  Patient also notified and instructed to call 911 or go to the closest hospital should the patient experience suicidal thoughts with or without a plan.  Patient endorses is having no firearms in the home.  Patient is in agreement with safety plan.  Patient/Guardian was advised Release of Information must be obtained prior to any record release in order to collaborate their care with an outside provider. Patient/Guardian was advised if they have not already done so to contact the registration department to sign all necessary forms in order for us  to release information regarding their care.   Consent: Patient/Guardian gives verbal consent for treatment and assignment of benefits for services provided during this visit. Patient/Guardian expressed understanding and agreed to proceed.    Dorn Jama Der, NP 09/17/2024, 11:04 AM

## 2024-09-19 ENCOUNTER — Ambulatory Visit (INDEPENDENT_AMBULATORY_CARE_PROVIDER_SITE_OTHER): Admitting: Podiatry

## 2024-09-19 DIAGNOSIS — L97512 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ed: Secondary | ICD-10-CM

## 2024-09-19 NOTE — Progress Notes (Signed)
 Subjective:  Patient ID: [name], male    DOB: Mar 10, 1956,  MRN: [id number]  Chief Complaint  Patient presents with   Wound Check    68 y.o. male presents for wound care.  Patient presents with right submetatarsal 1 ulcer.  He states he is doing okay.  The wound may have gotten a little bit bigger denies any other acute complaints he states he got another wound on the fifth digit.  Wanted get it evaluated denies any other acute complaints  Review of Systems: Negative except as noted in the HPI. Denies N/V/F/Ch.  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)    Depression    Diabetes mellitus without complication (HCC)    Diabetic infection of left foot (HCC)    Dysarthria    Gout    Hemiparesis affecting left side as late effect of cerebrovascular accident (CVA) (HCC)    HLD (hyperlipidemia)    Hyponatremia    Lactic acidosis    Left hallux osteomyelitis (HCC)    Osteomyelitis of ankle or foot, acute, left (HCC)    Stage 3a chronic kidney disease (HCC)    Stroke (HCC)    Type II diabetes mellitus with renal manifestations (HCC)     Current Outpatient Medications:    allopurinol  (ZYLOPRIM ) 300 MG tablet, Take 300 mg by mouth daily., Disp: , Rfl:    ammonium lactate (LAC-HYDRIN) 12 % lotion, Apply topically., Disp: , Rfl:    atorvastatin  (LIPITOR) 80 MG tablet, Take 1 tablet (80 mg total) by mouth daily., Disp: 90 tablet, Rfl: 0   B-D UF III MINI PEN NEEDLES 31G X 5 MM MISC, Inject 1 each into the skin 3 (three) times daily., Disp: , Rfl:    BD DISP NEEDLES 18G X 1-1/2 MISC, , Disp: , Rfl:    BD PLASTIPAK SYRINGE 21G X 1 3 ML MISC, , Disp: , Rfl:    BD PLASTIPAK SYRINGE 3 ML MISC, , Disp: , Rfl:    BD PRECISIONGLIDE NEEDLE 23G X 1-1/2 MISC, ,  Disp: , Rfl:    buPROPion  (WELLBUTRIN  XL) 150 MG 24 hr tablet, Take 1 tablet (150 mg total) by mouth daily., Disp: 90 tablet, Rfl: 1   Continuous Glucose Receiver (FREESTYLE LIBRE 14 DAY READER) DEVI, Use 1 Device as directed, Disp: , Rfl:    Continuous Glucose Sensor (FREESTYLE LIBRE 3 SENSOR) MISC, , Disp: , Rfl:    cyanocobalamin  100 MCG tablet, Take 100 mcg by mouth daily., Disp: , Rfl:    doxycycline  (VIBRAMYCIN ) 100 MG capsule, Take 100 mg by mouth 2 (two) times daily., Disp: , Rfl:    empagliflozin (JARDIANCE) 25 MG TABS tablet, Take 25 mg by mouth daily., Disp: , Rfl:    escitalopram  (LEXAPRO ) 20 MG tablet, Take 1 tablet (20 mg total) by mouth daily., Disp: 90 tablet, Rfl: 1   gabapentin  (NEURONTIN ) 400 MG capsule, Take 400 mg by mouth 3 (three) times daily., Disp: , Rfl:    LANTUS  SOLOSTAR 100 UNIT/ML Solostar Pen, Inject 6 Units into the skin at bedtime., Disp: , Rfl:    lisinopril  (ZESTRIL ) 5 MG tablet, Take 5 mg by mouth daily., Disp: , Rfl:    metFORMIN (GLUCOPHAGE) 500 MG tablet, Take 1,000 mg by  mouth 2 (two) times daily with a meal., Disp: , Rfl:    methocarbamol  (ROBAXIN ) 500 MG tablet, Take 1 tablet (500 mg total) by mouth every 6 (six) hours as needed for muscle spasms., Disp: 120 tablet, Rfl: 0   pravastatin  (PRAVACHOL ) 80 MG tablet, Take 80 mg by mouth daily., Disp: , Rfl:    pregabalin (LYRICA) 200 MG capsule, Take 200 mg by mouth 2 (two) times daily., Disp: , Rfl:    QUEtiapine  (SEROQUEL ) 25 MG tablet, Take 1 tablet (25 mg total) by mouth at bedtime., Disp: 90 tablet, Rfl: 0   rOPINIRole  (REQUIP ) 0.5 MG tablet, Take 0.5 mg by mouth at bedtime., Disp: , Rfl:    Semaglutide 14 MG TABS, Take 14 mg by mouth daily., Disp: , Rfl:    testosterone cypionate (DEPOTESTOSTERONE CYPIONATE) 200 MG/ML injection, Inject into the muscle every 14 (fourteen) days., Disp: , Rfl:   Social History   Tobacco Use  Smoking Status Former   Current packs/day: 0.00   Types: Cigarettes   Quit  date: 10/07/1997   Years since quitting: 26.9  Smokeless Tobacco Never    No Known Allergies Objective:  There were no vitals filed for this visit. There is no height or weight on file to calculate BMI. Constitutional Well developed. Well nourished.  Vascular Dorsalis pedis pulses palpable bilaterally. Posterior tibial pulses palpable bilaterally. Capillary refill normal to all digits.  No cyanosis or clubbing noted. Pedal hair growth normal.  Neurologic Normal speech. Oriented to person, place, and time. Protective sensation absent  Dermatologic Wound Location: Right submetatarsal 1 ulcer fat layer exposed Wound Base: Mixed Granular/Fibrotic Peri-wound: Calloused Exudate: Scant/small amount Serosanguinous exudate Wound Measurements: - See above  Orthopedic: No pain to palpation either foot.   Radiographs: None Assessment:   1. Right foot ulcer, with fat layer exposed (HCC)       Plan:  Patient was evaluated and treated and all questions answered.  Ulcer right submet 1 ulcer and right fifth digit fat layer exposed -Debridement as below. -Dressed with Betadine wet-to-dry dressing, DSD. -Continue off-loading with surgical shoe. - We will hold off of Santyl  for now. - Patient has failed  graft application and will therefore hold off on any further grafts.   Procedure: Excisional Debridement of Wound~stagnant Tool: Sharp chisel blade/tissue nipper Rationale: Removal of non-viable soft tissue from the wound to promote healing.  Anesthesia: none Pre-Debridement Wound Measurements: 1 cm x 1 cm x 1 cm Post-Debridement Wound Measurements: 1.2 cm x 1.2 cm x 0.3 cm Type of Debridement: Sharp Excisional Tissue Removed: Non-viable soft tissue Blood loss: Minimal (<50cc) Depth of Debridement: subcutaneous tissue. Technique: Sharp excisional debridement to bleeding, viable wound base.  Wound Progress: Is more granular but about the same Dressing: Dry, sterile, compression  dressing. Disposition: Patient tolerated procedure well. Patient to return in 1 week for follow-up.  Procedure: Excisional Debridement of Wound~stagnant Tool: Sharp chisel blade/tissue nipper Rationale: Removal of non-viable soft tissue from the wound to promote healing.  Anesthesia: none Pre-Debridement Wound Measurements: 0.3 cm x 0.3 cm x 0.3 cm Post-Debridement Wound Measurements: 0.4 cm x 0.4 cm x 0.3 cm Type of Debridement: Sharp Excisional Tissue Removed: Non-viable soft tissue Blood loss: Minimal (<50cc) Depth of Debridement: subcutaneous tissue. Technique: Sharp excisional debridement to bleeding, viable wound base.  Wound Progress: This is a initial evaluation open to monitor the progression of the wound Dressing: Dry, sterile, compression dressing. Disposition: Patient tolerated procedure well. Patient to return in 1 week for follow-up.  No follow-ups on file.

## 2024-10-01 ENCOUNTER — Ambulatory Visit (INDEPENDENT_AMBULATORY_CARE_PROVIDER_SITE_OTHER): Admitting: Psychiatry

## 2024-10-01 ENCOUNTER — Other Ambulatory Visit: Payer: Self-pay

## 2024-10-01 VITALS — BP 105/68 | HR 76 | Temp 96.4°F | Ht 74.0 in | Wt 226.0 lb

## 2024-10-01 DIAGNOSIS — F411 Generalized anxiety disorder: Secondary | ICD-10-CM | POA: Diagnosis not present

## 2024-10-01 DIAGNOSIS — F514 Sleep terrors [night terrors]: Secondary | ICD-10-CM | POA: Diagnosis not present

## 2024-10-01 DIAGNOSIS — F4310 Post-traumatic stress disorder, unspecified: Secondary | ICD-10-CM | POA: Diagnosis not present

## 2024-10-01 DIAGNOSIS — F331 Major depressive disorder, recurrent, moderate: Secondary | ICD-10-CM | POA: Diagnosis not present
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
## 2024-10-07 ENCOUNTER — Telehealth: Payer: Self-pay

## 2024-10-07 ENCOUNTER — Other Ambulatory Visit: Payer: Self-pay | Admitting: Psychiatry

## 2024-10-07 DIAGNOSIS — F331 Major depressive disorder, recurrent, moderate: Secondary | ICD-10-CM

## 2024-10-07 MED ORDER — ESCITALOPRAM OXALATE 20 MG PO TABS: 20.0000 mg | ORAL_TABLET | Freq: Every day | ORAL | 1 refills | Status: AC

## 2024-10-07 NOTE — Telephone Encounter (Signed)
 Fax received for refill of escitalopram  (LEXAPRO ) 20 MG tablet  Last seen on 10/01/2024 Next apt on 10/15/2024 [facility] - [place], [place] - [address]    Take 1 tablet (20 mg total) by mouth daily., Starting Tue 04/16/2024, Normal  Dispense: 90 tablet  Refills: 1 ordered  Pharmacy: [facility] - [place], [place] - [address] (Ph: [phone])  Order Details Ordered on: 04/16/24  Associated Dx: Major depressive disorder, recurrent episode, moderate (HCC)  Authorizing provider: Saucier, Dorn Ruth, NP

## 2024-10-15 ENCOUNTER — Ambulatory Visit: Admitting: Psychiatry
